# Patient Record
Sex: Female | Born: 1945 | Race: Black or African American | Hispanic: No | Marital: Single | Smoking: Former smoker
Health system: Southern US, Community
[De-identification: ages and names within clinical notes are randomized; demographics above are authoritative.]

## PROBLEM LIST (undated history)

## (undated) DIAGNOSIS — Z5189 Encounter for other specified aftercare: Secondary | ICD-10-CM

## (undated) DIAGNOSIS — E785 Hyperlipidemia, unspecified: Secondary | ICD-10-CM

## (undated) DIAGNOSIS — E119 Type 2 diabetes mellitus without complications: Secondary | ICD-10-CM

## (undated) DIAGNOSIS — H269 Unspecified cataract: Secondary | ICD-10-CM

## (undated) DIAGNOSIS — C801 Malignant (primary) neoplasm, unspecified: Secondary | ICD-10-CM

## (undated) DIAGNOSIS — K219 Gastro-esophageal reflux disease without esophagitis: Secondary | ICD-10-CM

## (undated) DIAGNOSIS — J189 Pneumonia, unspecified organism: Secondary | ICD-10-CM

## (undated) DIAGNOSIS — E079 Disorder of thyroid, unspecified: Secondary | ICD-10-CM

## (undated) DIAGNOSIS — D509 Iron deficiency anemia, unspecified: Secondary | ICD-10-CM

## (undated) DIAGNOSIS — I10 Essential (primary) hypertension: Secondary | ICD-10-CM

## (undated) DIAGNOSIS — R0600 Dyspnea, unspecified: Secondary | ICD-10-CM

## (undated) HISTORY — DX: Type 2 diabetes mellitus without complications: E11.9

## (undated) HISTORY — DX: Unspecified cataract: H26.9

## (undated) HISTORY — PX: US ECHOCARDIOGRAPHY: HXRAD669

## (undated) HISTORY — DX: Gastro-esophageal reflux disease without esophagitis: K21.9

## (undated) HISTORY — PX: TUBAL LIGATION: SHX77

## (undated) HISTORY — DX: Encounter for other specified aftercare: Z51.89

## (undated) HISTORY — PX: CATARACT EXTRACTION: SUR2

## (undated) HISTORY — DX: Iron deficiency anemia, unspecified: D50.9

## (undated) HISTORY — PX: CHOLECYSTECTOMY: SHX55

## (undated) HISTORY — DX: Disorder of thyroid, unspecified: E07.9

## (undated) HISTORY — DX: Hyperlipidemia, unspecified: E78.5

## (undated) HISTORY — DX: Essential (primary) hypertension: I10

---

## 2004-09-18 ENCOUNTER — Ambulatory Visit: Payer: Self-pay | Admitting: Internal Medicine

## 2004-09-30 ENCOUNTER — Ambulatory Visit: Payer: Self-pay | Admitting: Internal Medicine

## 2004-10-13 ENCOUNTER — Ambulatory Visit (HOSPITAL_COMMUNITY): Admission: RE | Admit: 2004-10-13 | Discharge: 2004-10-13 | Payer: Self-pay | Admitting: Internal Medicine

## 2004-10-13 ENCOUNTER — Ambulatory Visit: Payer: Self-pay | Admitting: Internal Medicine

## 2004-10-13 HISTORY — PX: COLONOSCOPY: SHX174

## 2004-10-13 HISTORY — PX: ESOPHAGOGASTRODUODENOSCOPY: SHX1529

## 2005-01-08 ENCOUNTER — Ambulatory Visit: Payer: Self-pay | Admitting: Internal Medicine

## 2005-01-22 ENCOUNTER — Ambulatory Visit: Payer: Self-pay | Admitting: Internal Medicine

## 2005-02-15 ENCOUNTER — Ambulatory Visit: Payer: Self-pay | Admitting: Internal Medicine

## 2005-02-15 ENCOUNTER — Ambulatory Visit (HOSPITAL_COMMUNITY): Admission: RE | Admit: 2005-02-15 | Discharge: 2005-02-15 | Payer: Self-pay | Admitting: Internal Medicine

## 2005-04-06 ENCOUNTER — Ambulatory Visit: Payer: Self-pay | Admitting: Gastroenterology

## 2005-09-27 ENCOUNTER — Ambulatory Visit: Payer: Self-pay | Admitting: Internal Medicine

## 2006-10-17 ENCOUNTER — Ambulatory Visit: Payer: Self-pay | Admitting: Internal Medicine

## 2008-10-11 ENCOUNTER — Ambulatory Visit: Payer: Self-pay | Admitting: Internal Medicine

## 2009-09-05 ENCOUNTER — Encounter (INDEPENDENT_AMBULATORY_CARE_PROVIDER_SITE_OTHER): Payer: Self-pay | Admitting: *Deleted

## 2009-09-25 DIAGNOSIS — K921 Melena: Secondary | ICD-10-CM | POA: Insufficient documentation

## 2009-09-25 DIAGNOSIS — I10 Essential (primary) hypertension: Secondary | ICD-10-CM

## 2009-09-25 DIAGNOSIS — E119 Type 2 diabetes mellitus without complications: Secondary | ICD-10-CM

## 2009-09-25 DIAGNOSIS — E663 Overweight: Secondary | ICD-10-CM | POA: Insufficient documentation

## 2009-09-25 DIAGNOSIS — F17200 Nicotine dependence, unspecified, uncomplicated: Secondary | ICD-10-CM

## 2009-09-25 DIAGNOSIS — Z8719 Personal history of other diseases of the digestive system: Secondary | ICD-10-CM | POA: Insufficient documentation

## 2009-09-25 DIAGNOSIS — E118 Type 2 diabetes mellitus with unspecified complications: Secondary | ICD-10-CM | POA: Insufficient documentation

## 2009-09-25 DIAGNOSIS — D509 Iron deficiency anemia, unspecified: Secondary | ICD-10-CM

## 2009-09-26 ENCOUNTER — Ambulatory Visit: Payer: Self-pay | Admitting: Internal Medicine

## 2009-09-26 DIAGNOSIS — K219 Gastro-esophageal reflux disease without esophagitis: Secondary | ICD-10-CM

## 2009-09-26 DIAGNOSIS — R198 Other specified symptoms and signs involving the digestive system and abdomen: Secondary | ICD-10-CM | POA: Insufficient documentation

## 2009-09-29 ENCOUNTER — Ambulatory Visit: Payer: Self-pay | Admitting: Internal Medicine

## 2009-10-07 ENCOUNTER — Encounter: Payer: Self-pay | Admitting: Gastroenterology

## 2009-10-08 ENCOUNTER — Encounter: Payer: Self-pay | Admitting: Internal Medicine

## 2009-10-09 ENCOUNTER — Encounter: Payer: Self-pay | Admitting: Gastroenterology

## 2009-10-23 ENCOUNTER — Encounter: Payer: Self-pay | Admitting: Internal Medicine

## 2009-10-24 ENCOUNTER — Ambulatory Visit (HOSPITAL_COMMUNITY): Admission: RE | Admit: 2009-10-24 | Discharge: 2009-10-24 | Payer: Self-pay | Admitting: Internal Medicine

## 2009-10-24 ENCOUNTER — Ambulatory Visit: Payer: Self-pay | Admitting: Internal Medicine

## 2009-10-24 HISTORY — PX: ESOPHAGOGASTRODUODENOSCOPY: SHX1529

## 2009-10-24 HISTORY — PX: COLONOSCOPY: SHX174

## 2009-11-03 ENCOUNTER — Encounter (INDEPENDENT_AMBULATORY_CARE_PROVIDER_SITE_OTHER): Payer: Self-pay

## 2009-11-03 ENCOUNTER — Encounter: Payer: Self-pay | Admitting: Internal Medicine

## 2009-11-10 ENCOUNTER — Encounter (INDEPENDENT_AMBULATORY_CARE_PROVIDER_SITE_OTHER): Payer: Self-pay | Admitting: *Deleted

## 2009-11-21 ENCOUNTER — Encounter: Payer: Self-pay | Admitting: Internal Medicine

## 2009-11-24 LAB — CONVERTED CEMR LAB
Basophils Absolute: 0 10*3/uL (ref 0.0–0.1)
Basophils Relative: 1 % (ref 0–1)
Hemoglobin: 12.1 g/dL (ref 12.0–15.0)
MCHC: 31.7 g/dL (ref 30.0–36.0)
Monocytes Absolute: 0.1 10*3/uL (ref 0.1–1.0)
Neutro Abs: 2.2 10*3/uL (ref 1.7–7.7)
Neutrophils Relative %: 57 % (ref 43–77)
Platelets: 236 10*3/uL (ref 150–400)
RDW: 15 % (ref 11.5–15.5)

## 2009-11-25 ENCOUNTER — Encounter: Payer: Self-pay | Admitting: Urgent Care

## 2009-12-09 ENCOUNTER — Ambulatory Visit: Payer: Self-pay | Admitting: Internal Medicine

## 2009-12-09 ENCOUNTER — Encounter: Payer: Self-pay | Admitting: Gastroenterology

## 2010-05-04 ENCOUNTER — Encounter (INDEPENDENT_AMBULATORY_CARE_PROVIDER_SITE_OTHER): Payer: Self-pay

## 2010-06-08 LAB — CONVERTED CEMR LAB
Basophils Relative: 1 % (ref 0–1)
Eosinophils Absolute: 0.1 10*3/uL (ref 0.0–0.7)
Lymphs Abs: 1 10*3/uL (ref 0.7–4.0)
MCV: 80.7 fL (ref 78.0–100.0)
Neutro Abs: 2.3 10*3/uL (ref 1.7–7.7)
Neutrophils Relative %: 59 % (ref 43–77)
Platelets: 281 10*3/uL (ref 150–400)
WBC: 3.8 10*3/uL — ABNORMAL LOW (ref 4.0–10.5)

## 2010-06-10 ENCOUNTER — Encounter (INDEPENDENT_AMBULATORY_CARE_PROVIDER_SITE_OTHER): Payer: Self-pay | Admitting: *Deleted

## 2010-06-25 ENCOUNTER — Ambulatory Visit: Payer: Self-pay | Admitting: Internal Medicine

## 2010-07-06 ENCOUNTER — Ambulatory Visit: Payer: Self-pay | Admitting: Internal Medicine

## 2010-07-08 ENCOUNTER — Encounter: Payer: Self-pay | Admitting: Internal Medicine

## 2010-07-17 ENCOUNTER — Emergency Department (HOSPITAL_COMMUNITY)
Admission: EM | Admit: 2010-07-17 | Discharge: 2010-07-17 | Payer: Self-pay | Source: Home / Self Care | Admitting: Emergency Medicine

## 2010-07-27 ENCOUNTER — Ambulatory Visit (HOSPITAL_COMMUNITY)
Admission: RE | Admit: 2010-07-27 | Discharge: 2010-07-27 | Payer: Self-pay | Source: Home / Self Care | Attending: Internal Medicine | Admitting: Internal Medicine

## 2010-07-27 ENCOUNTER — Encounter (INDEPENDENT_AMBULATORY_CARE_PROVIDER_SITE_OTHER): Payer: Self-pay

## 2010-07-27 HISTORY — PX: GIVENS CAPSULE STUDY: SHX5432

## 2010-08-06 ENCOUNTER — Telehealth: Payer: Self-pay | Admitting: Gastroenterology

## 2010-08-12 DIAGNOSIS — K31819 Angiodysplasia of stomach and duodenum without bleeding: Secondary | ICD-10-CM

## 2010-08-21 ENCOUNTER — Encounter: Payer: Self-pay | Admitting: Gastroenterology

## 2010-09-03 ENCOUNTER — Encounter: Payer: Self-pay | Admitting: Internal Medicine

## 2010-09-03 LAB — CONVERTED CEMR LAB
Lymphs Abs: 1.1 10*3/uL (ref 0.7–4.0)
Monocytes Relative: 8 % (ref 3–12)
Neutro Abs: 2.2 10*3/uL (ref 1.7–7.7)
Neutrophils Relative %: 60 % (ref 43–77)
RBC: 4.23 M/uL (ref 3.87–5.11)
WBC: 3.6 10*3/uL — ABNORMAL LOW (ref 4.0–10.5)

## 2010-09-15 NOTE — Miscellaneous (Signed)
Summary: Orders Update  Clinical Lists Changes  Orders: Added new Test order of T-CBC w/Diff ([id number]) - Signed 

## 2010-09-15 NOTE — Letter (Signed)
Summary: CAPSUL STUDY ORDER  CAPSUL STUDY ORDER   Imported By: Ave Filter 07/08/2010 16:01:18  _____________________________________________________________________  External Attachment:    Type:   Image     Comment:   External Document

## 2010-09-15 NOTE — Letter (Signed)
Summary: Recall, Labs Needed  [facility]  [address]   [place], [place] [address]   Phone: [phone]  Fax: [phone]    May 04, 2010  Jenna [name] [address] [place], [place]  [address] [birth year]   Dear [name],   Our records indicate it is time to repeat your blood work.  You can take the enclosed form to the lab on or near the date indicated.  Please make note of the new location of the lab:   [address], 2nd floor   [facility]  Our office will call you within a week to ten business days with the results.  If you do not hear from [place] in 10 business days, you should call the office.  If you have any questions regarding this, call the office at [phone], and ask for the nurse.  Labs are due on 06/10/2010.   Sincerely,    Hendricks Limes LPN  [facility] Ph: [phone]   Fax: [phone]

## 2010-09-15 NOTE — Letter (Signed)
Summary: Appointment Reminder  [facility]  [address]   [place], [place] [address]   Phone: [phone]  Fax: [phone]       September 05, 2009   Jenna [name] [address] [place], [place]  [address] 04/09/1946    Dear [name],  We have been unable to reach you by phone to schedule a follow up   appointment that was recommended for you by Dr. Jena Gauss. It is very   important that we reach you to schedule an appointment. We hope that you  allow [place] to participate in your health care needs. Please contact us at  [phone] at your earliest convenience to schedule your appointment.  Sincerely,    [facility] R. Roetta Sessions, M.D.    Kassie Mends, M.D. Lorenza Burton, FNP-BC    Tana Coast, PA-C Phone: [phone]    Fax: [phone]

## 2010-09-15 NOTE — Assessment & Plan Note (Signed)
Summary: crd/glu   One iFob returned and it was positive.  Allergies: 1)  ! * Nexium  Other Orders: Immuno-chemical Fecal Occult (38756)  Appended Document: crd/glu See ov dict addendum.

## 2010-09-15 NOTE — Medication Information (Signed)
Summary: PA for dexilant  PA for dexilant   Imported By: Hendricks Limes LPN 14/78/2956 21:30:86  _____________________________________________________________________  External Attachment:    Type:   Image     Comment:   External Document

## 2010-09-15 NOTE — Letter (Signed)
Summary: Patient Notice, Endo Biopsy Results  [facility]  [address]   [place], [place] [address]   Phone: [phone]  Fax: [phone]       November 03, 2009   Jenna [name] [address] [place], [place]  [address] Jun 28, 1946    Dear [name],  I am pleased to inform you that the biopsies taken during your recent endoscopic examination did not show any evidence of cancer upon pathologic examination.  There was mild inflammation in your stomach.  Additional information/recommendations:   Continue with the treatment plan as outlined on the day of your exam.  You should have a repeat colonoscopy examination in 3 years.  Please call us if you are having persistent problems or have questions about your condition that have not been fully answered at this time.  Sincerely,    R. Roetta Sessions MD  [facility] Ph: [phone]   Fax: [phone]   Appended Document: Patient Notice, Endo Biopsy Results Letter mailed. LM for pt to call. (Needs appt with extender with CBC in 4-6 weeks per Dr. Jena Gauss)  Lab order on file.  Appended Document: Patient Notice, Endo Biopsy Results lm for pt to call back.

## 2010-09-15 NOTE — Assessment & Plan Note (Signed)
Summary: OV TO FU ON ANEMIA,MAY NEED CAPSULE/SS   Visit Type:  Follow-up Visit Primary Care Jenna [name]:  Sasser  Chief Complaint:  F/U anemia.  History of Present Illness: 65 year old lady with history of anemia and Hemoccult-positive stools. Prior EGD and colonoscopy earlier this year demonstrated duodenal AVM ablated y; biopsies negative. Anemia transit resolved ;with most recent CBC from October 21 revealed a hemoglobin of 11 MCV 80. Clinically doing well no abdominal pain melena, hematochezia; reflux symptoms continue be well controlled with Dexilant. She is not on any iron supplementation or vitamins at this time. This lady had a similar workup for similar problem back into the 2008. 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  Current Medications (verified): 1)  Glipizide 5 Mg Tabs (Glipizide) .... Two Times A Day 2)  Dexilant 60 Mg Cpdr (Dexlansoprazole) .... One By Mouth 30 Mins Before Breakfast Daily 3)  Losartan Potassium 100 Mg Tabs (Losartan Potassium) .... Take 1 Tablet By Mouth Once A Day  Allergies (verified): 1)  ! * Nexium 2)  ! 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 Jenna [name]  Past Medical History: Hypertension Gerd Iron Deficiency Anemia  Family History: Father: Deceased age 71  old age Mother: Decease MI age 77 Siblings: 3 one brother deceased age 66   DM one sister deceased age 56 DM/CVA One sister living   DM  Social History: Marital Status: No Children:3 living    one deceased age 78   kidney failure  Occupation: Loss adjuster, [employer]  AM  [employer]  Vital Signs:  Patient profile:   65 year old female Height:      65.5 inches Weight:      194 pounds BMI:     31.91 Temp:     99.1 degrees F oral Pulse rate:   72 / minute BP sitting:   140 / 80  (left arm) Cuff size:   regular  Vitals Entered By: Cloria Spring LPN (2010-07-12 8:49 AM)  Physical Exam  General:  alert conversant pleasant no acute distress Abdomen:  somewhat obese positive bowel sounds soft and nontender without appreciable mass or organomegaly  Impression & Recommendations: Impression:   pleasant 65 year old lady with a history of a mild anemia,  occult blood positive stool. History of a duodenal AVM ablated previously. Clinically doing very well with a mild microcytic anemia. Not mentioned above, she denies taking nonsteroidal agents.  Recommendations: Continue Dexalant 60 mg orally daily. Recommend a multivitamin with iron i.e. Centrum Silver daily  Send her home with occult blood test kit for stool.  Assuming DI FOBT is negative, we'll repeat her CBC in January 2012. Office visit here in 6 months.  Other Orders: Est. Patient Level III ([address])

## 2010-09-15 NOTE — Letter (Signed)
Summary: TCS/EGD ORDER  TCS/EGD ORDER   Imported By: Ave Filter 10/08/2009 10:05:14  _____________________________________________________________________  External Attachment:    Type:   Image     Comment:   External Document

## 2010-09-15 NOTE — Assessment & Plan Note (Signed)
Summary: Needs appt with extender with CBC in 4-6 weeks per Dr. Arelia Sneddon   Visit Type:  Follow-up Visit Primary Care Provider:  Sasser  Chief Complaint:  F/U procedure.  History of Present Illness: [name] is here for f/u visit. She has chronic GERD. She did not tolerate Nexium due to headache and n/v. Previously did well on Aciphex. At last OV she c/o frequent epigastric pain and n/v which occurs several hours after meals. She was started on Dexilant with complete control of these symptoms. She had TCS/TI/EGD for abd pain and positive ifobt, h/o IDA.  She had abnormal lesion in the  ascending colon just distal to the ileocecal valve with benign bx. The remainder of the colonic mucosa and terminal ileum mucosa appeared grossly normal.  However, the prep made the exam more difficult and a smaller lesion may have been obscured. She had normal esophagus, small hiatal hernia, nodular lesion antrum/distal greater curvature, status post biopsy (benign).  Duodenal AVM ablated.  Recent H/H was normal. WBC slightly low at 3.8.           Current Medications (verified): 1)  Multivitamins  Tabs (Multiple Vitamin) .... Once Daily 2)  Glipizide 5 Mg Tabs (Glipizide) .... Two Times A Day 3)  Dexilant 60 Mg Cpdr (Dexlansoprazole) .... One By Mouth 30 Mins Before Breakfast Daily 4)  Losartan Potassium 100 Mg Tabs (Losartan Potassium) .... Take 1 Tablet By Mouth Once A Day  Allergies (verified): 1)  ! * Nexium 2)  ! Asa  Review of Systems      See HPI  Vital Signs:  Patient profile:   65 year old female Height:      65.5 inches Weight:      189 pounds BMI:     31.08 Temp:     98.7 degrees F oral Pulse rate:   60 / minute BP sitting:   128 / 60  (left arm) Cuff size:   large  Vitals Entered By: Cloria Spring LPN (December 09, 2009 9:36 AM)  Physical Exam  General:  Well developed, well nourished, no acute distress. Head:  Normocephalic and atraumatic. Eyes:  Sclera nonicteric. Mouth:   OP moist. Extremities:  No clubbing, cyanosis, edema or deformities noted. Neurologic:  Alert and  oriented x4;  grossly normal neurologically. Skin:  Intact without significant lesions or rashes. Psych:  Alert and cooperative. Normal mood and affect.  Impression & Recommendations:  Problem # 1:  ANEMIA, IRON DEFICIENCY (ICD-280.9)  H/O IDA in past. Current H/H normal. Will keep check on H/H, given h/o AVM. Recheck H/H in six months.  Orders: Est. Patient Level II ([address])  Problem # 2:  GERD (ICD-530.81)  Doing well on Dexilant. OV in 4/12.  Orders: Est. Patient Level II ([address])  Appended Document: Needs appt with extender with CBC in 4-6 weeks per Dr. Arelia Sneddon Please arrange for CBC in 6 months for h/o IDA, heme positive stool.  Appended Document: Needs appt with extender with CBC in 4-6 weeks per Dr. Arelia Sneddon lab order on file

## 2010-09-15 NOTE — Op Note (Signed)
  Jenna [name], Jenna [name]           ACCOUNT NO.:  [id number]  MEDICAL RECORD NO.:  [id number]          PATIENT TYPE:  AMB  LOCATION:  DAY                           FACILITY:  [facility]  PHYSICIAN:  R. Roetta Sessions, M.D. DATE OF BIRTH:  07/30/1946  DATE OF PROCEDURE:  07/27/2010 DATE OF DISCHARGE:  07/27/2010                              OPERATIVE REPORT   PROCEDURE:  Small bowel Givens caps study.  INDICATIONS FOR PROCEDURE:  Ms. Stauch is a pleasant, 65 year old female who has a history of iron deficiency anemia as well as heme- positive stools.  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  She was last seen in our clinic in November, doing clinically well.  However, her hemoglobin was slightly low at 11 and 35.6.  IFOBT was positive.  It was then decided, due to her history of duodenal AVMs as well as the most recent endoscopy in March that had an AVM ablated, that it was necessary to do a Givens capsule study.  As of note, in March she underwent an endoscopy and a colonoscopy secondary to epigastric pain and heme-positive stools. Colonoscopy showed normal a rectum, an abnormal lesion in the ascending colon distal to the ileocecal valve status post biopsy which was normal. EG findings showed a normal esophagus, small hiatal hernia, nodular lesion in the antrum/distal greater curvature status post biopsy as well as a duodenal AVM that was ablated.  FINDINGS:  First gastric image was at 44 seconds, first duodenal image was at 2 hours, 30 minutes, and 44 seconds.  First ileocecal valve image was at 5 hours, 28 minutes, and 25 seconds.  First cecal image was at 5 hours, 28 minutes, and 46 seconds.  As of note, there are multiple AVMs noted starting at 2 hours and 30 minutes and 53 seconds.  None were actively bleeding.  No other masses or strictures were noted.  ASSESSMENT AND RECOMMENDATIONS:  Ms. Prada is a 65 year old  female who has undergone a small bowel Givens capsule study.  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  There is no evidence of mass stricture on this study; however, there are multiple arteriovenous malformations which could definitely be the contributor to her drifting hemoglobin and hematocrit. She is clinically stable at this time, and she states that she is avoiding nonsteroidals.  She is also off aspirin as well.  She will continue Dexilant daily, which she has been taking for her reflux as well as a multivitamin with iron.  She needs to avoid all nonsteroidal antiinflammatory drugs and aspirin products.  We will repeat a CBC in 3 months to assess for stability.    ______________________________ Gerrit Halls, ANP-BC   ______________________________ R. Roetta Sessions, M.D.    AS/MEDQ  D:  08/12/2010  T:  08/12/2010  Job:  [id number]  Electronically Signed by Gerrit Halls  on 09/02/2010 04:15:51 PM Electronically Signed by Lorrin Goodell M.D. on 09/15/2010 01:35:49 PM

## 2010-09-15 NOTE — Letter (Signed)
Summary: Scheduled Appointment  [facility]  [address]   [place], [place] [address]   Phone: [phone]  Fax: [phone]    June 10, 2010   Dear: Jenna [name]            DOB: April 10, 1946    I have been instructed to schedule you an appointment in our office.  Your appointment is as follows:   Date:           June 25, 2010   Time:           8:45AM     Please be here 15 minutes early.   Melissaann Dizdarevic:      DR Jena Gauss    Please contact the office if you need to reschedule this appointment for a more convenient time.   Thank you,    Diana Eves       [facility] Ph: [phone]   Fax: [phone]

## 2010-09-15 NOTE — Assessment & Plan Note (Signed)
Summary: yearly fu/GERD/SS   Visit Type:  f/u Primary Care Provider:  Sasser  Chief Complaint:  1 year follow up- needs something for reflux.  History of Present Illness: Ms. Klunder is here for one year f/u visit. She has chronic GERD. She did not tolerate Nexium due to headache and n/v. Previously did well on Aciphex. She c/o frequent epigastric pain and n/v which occurs several hours after meals. Denies dysphagia or wt loss. BM 2-3 per day, some loose and some hard. Feels urge to go often but urge gone before she can get to bathroom. No melena or brbpr.  No wt gain.        Current Medications (verified): 1)  Multivitamins  Tabs (Multiple Vitamin) .... Once Daily 2)  Glipizide 5 Mg Tabs (Glipizide) .... Two Times A Day 3)  Benicar 20 Mg Tabs (Olmesartan Medoxomil) .... Once Daily 4)  Aspir-Low 81 Mg Tbec (Aspirin) .... Once Daily  Allergies (verified): 1)  ! * Nexium  Review of Systems      See HPI  Vital Signs:  Patient profile:   65 year old female Height:      65.5 inches Weight:      194 pounds BMI:     31.91 Temp:     97.9 degrees F oral Pulse rate:   64 / minute BP sitting:   142 / 80  (left arm) Cuff size:   regular  Vitals Entered By: Hendricks Limes LPN (September 26, 2009 11:12 AM)  Physical Exam  General:  Well developed, well nourished, no acute distress. Head:  Normocephalic and atraumatic. Eyes:  Sclera nonicteric. Mouth:  OP miost.  Abdomen:  Mild epigastric tenderness. No HSM or masses. No rebound or guarding. No abd bruit or hernia. Extremities:  No clubbing, cyanosis, edema or deformities noted. Neurologic:  Alert and  oriented x4;  grossly normal neurologically. Skin:  Intact without significant lesions or rashes. Psych:  Alert and cooperative. Normal mood and affect.  Impression & Recommendations:  Problem # 1:  GERD (ICD-530.81)  Suspect pp epigastric pain, n/v secondary to untreated GERD. Start Dexilant 60mg  by mouth daily. Covered by [facility].  #20 samples, RX, rebate card provided. If controls symptoms, then OV in one year. If persisted symptoms after two weeks, then will get her back on Aciphex and fill out prior authorization (since she did well previously).   Orders: Est. Patient Level II ([address])  Problem # 2:  ANEMIA, IRON DEFICIENCY (ICD-280.9)  H/O IDA in past. No signigicant change in bowels but complains of feeling like needs to have bowel movment frequently. Will check ifobt. Retrieve lab results, to be done 3/11 at Dr. Dian Situ office. Further recommendations to follow. Last TCS five years ago.   Orders: Est. Patient Level II ([address]) Prescriptions: DEXILANT 60 MG CPDR (DEXLANSOPRAZOLE) one by mouth 30 mins before breakfast daily  #30 x 11   Entered and Authorized by:   Leanna Battles. Dixon Boos   Signed by:   Leanna Battles Taichi Repka PA-C on 09/26/2009   Method used:   Electronically to        [facility] (retail)       [address]       [place], [place]  [address]       Ph: [phone]       Fax: [phone]   RxID:   [id number]   Appended Document: yearly fu/GERD/SS ifobt positive. Recommend TCS +/-  EGD with RMR. Day of prep take 1/2 dose of glipizide. She needs CBC now. Diagnosis: positive ifobt, h/o ida, epig pain with vomiting ?untreated GERD.  Appended Document: yearly fu/GERD/SS LMOM to call. Lab order on file to be faxed.  Appended Document: yearly fu/GERD/SS Pt informed. Lab order faxed to [facility] per pt request.  Appended Document: yearly fu/GERD/SS Pt scheduled for procedure on 10/24/09@7 :30am. Pt aware of appt.

## 2010-09-15 NOTE — Letter (Signed)
Summary: Scheduled Appointment  [facility]  [address]   [place], [place] [address]   Phone: [phone]  Fax: [phone]    November 10, 2009   Dear: Hildred Laser            DOB: 1945-12-20    I have been instructed to schedule you an appointment in our office.  Your appointment is as follows:   Date: April 26,2011   Time: 930am     Please be here 15 minutes early.   Provider: Tana Coast    Please contact the office if you need to reschedule this appointment for a more convenient time.   Thank you,    [facility] Ph: [phone]   Fax: [phone]

## 2010-09-15 NOTE — Assessment & Plan Note (Signed)
Summary: DROPPED OFF STOOL/SS   Pt returned one iFOBT and it was positive.    Allergies: 1)  ! * Nexium 2)  ! Asa  Other Orders: Immuno-chemical Fecal Occult ([id number])  Appended Document: DROPPED OFF STOOL/SS history of duodenal AVMs on capsule 3 years ago. I think we should go ahead and do another capsule and confirm prior findings and reassess small bowel now. She may need to be referred for double balloon enteroscopy at a tertiary referral center.  Appended Document: DROPPED OFF STOOL/SS tried to call pt- not home- left message for return call  Appended Document: DROPPED OFF STOOL/SS Pt scheduled for 07/27/10@7 :30am...pt aware of appt.  Appended Document: DROPPED OFF STOOL/SS Pt scheduled for Givens Capsule 07/27/10@7 :30.Marland KitchenMarland KitchenPt aware of appt.

## 2010-09-15 NOTE — Letter (Signed)
Summary: Recall, Labs Needed  [facility]  [address]   [place], [place] [address]   Phone: [phone]  Fax: [phone]    November 03, 2009  KHRISTINA JANOTA [address] [place], [place]  [address] 02/09/1946   Dear [name],   Our records indicate it is time to repeat your blood work.  You can take the enclosed form to the lab on or near the date indicated.  Please make note of the new location of the lab:   [address], 2nd floor   [facility]  Our office will call you within a week to ten business days with the results.  If you do not hear from [place] in 10 business days, you should call the office.  If you have any questions regarding this, call the office at [phone], and ask for the nurse.  Labs are due on 12/04/2009.   Sincerely,    Hendricks Limes LPN  [facility] Ph: [phone]   Fax: [phone]

## 2010-09-17 ENCOUNTER — Encounter (INDEPENDENT_AMBULATORY_CARE_PROVIDER_SITE_OTHER): Payer: Self-pay | Admitting: *Deleted

## 2010-09-17 NOTE — Letter (Signed)
Summary: Recall, Labs Needed  [facility]  [address]   [place], [place] [address]   Phone: [phone]  Fax: [phone]    July 27, 2010  SEAIRRA OTANI [address] [place], [place]  [address] 1946-03-22   Dear [name],   Our records indicate it is time to repeat your blood work.  You can take the enclosed form to the lab on or near the date indicated.  Please make note of the new location of the lab:   [address], 2nd floor   [facility]  Our office will call you within a week to ten business days with the results.  If you do not hear from [place] in 10 business days, you should call the office.  If you have any questions regarding this, call the office at [phone], and ask for the nurse.  Labs are due on 08/24/2010.   Sincerely,    Hendricks Limes LPN  [facility] Ph: [phone]   Fax: [phone]

## 2010-09-17 NOTE — Miscellaneous (Signed)
Summary: Orders Update  Clinical Lists Changes  Orders: Added new Test order of T-CBC w/Diff ([id number]) - Signed 

## 2010-09-17 NOTE — Progress Notes (Signed)
   Informed pt capsule study recently downloaded yesterday. Reviewed today by myself, will review with another extender on Tuesday. Pt informed and was agreeable to this.  Appended Document:  please call pt and inform study has been read. Multiple AVMs noted. No active bleeding. Needs to avoid NSAIDs, ASA, things of that nature. Will need CBC rechecked in 3 mos. Contact office if any evidence of active bleeding.   Appended Document:  tried to call pt- NA  Appended Document:  tried to call pt- LMOM  Appended Document:  pt aware, lab order on file

## 2010-09-23 NOTE — Letter (Signed)
Summary: Recall Office Visit  [facility]  [address]   [place], [place] [address]   Phone: [phone]  Fax: [phone]      September 17, 2010   DONNICE NIELSEN [address] [place], [place]  [address] [birth year]   Dear [name],   According to our records, it is time for you to schedule a follow-up office visit with [place].   At your convenience, please call [phone] to schedule an office visit. If you have any questions, concerns, or feel that this letter is in error, we would appreciate your call.   Sincerely,    Diana Eves  [facility] Ph: [phone]   Fax: [phone]

## 2010-10-08 ENCOUNTER — Telehealth (INDEPENDENT_AMBULATORY_CARE_PROVIDER_SITE_OTHER): Payer: Self-pay | Admitting: *Deleted

## 2010-10-09 ENCOUNTER — Encounter: Payer: Self-pay | Admitting: Internal Medicine

## 2010-10-09 LAB — CONVERTED CEMR LAB
Basophils Relative: 0 % (ref 0–1)
Eosinophils Absolute: 0.1 10*3/uL (ref 0.0–0.7)
HCT: 35.1 % — ABNORMAL LOW (ref 36.0–46.0)
Hemoglobin: 10.3 g/dL — ABNORMAL LOW (ref 12.0–15.0)
MCHC: 29.3 g/dL — ABNORMAL LOW (ref 30.0–36.0)
MCV: 80.7 fL (ref 78.0–100.0)
Monocytes Absolute: 0.4 10*3/uL (ref 0.1–1.0)
Monocytes Relative: 8 % (ref 3–12)
RBC: 4.35 M/uL (ref 3.87–5.11)

## 2010-10-13 NOTE — Miscellaneous (Signed)
Summary: Orders Update  Clinical Lists Changes  Orders: Added new Test order of T-CBC w/Diff ([id number]) - Signed 

## 2010-10-13 NOTE — Progress Notes (Signed)
  Phone Note Call from Patient   Reason for Call: Refill Medication, Talk to Nurse Summary of Call: please call pt back to let her know if her dexilant rx has been called in to [facility] [phone] Initial call taken by: Diana Eves,  October 08, 2010 3:31 PM     Appended Document:  pt aware rx has been sent

## 2010-10-23 ENCOUNTER — Encounter (INDEPENDENT_AMBULATORY_CARE_PROVIDER_SITE_OTHER): Payer: Self-pay

## 2010-10-27 LAB — URINALYSIS, ROUTINE W REFLEX MICROSCOPIC
Bilirubin Urine: NEGATIVE
Hgb urine dipstick: NEGATIVE
Protein, ur: NEGATIVE mg/dL
Specific Gravity, Urine: >1.03 — ABNORMAL HIGH (ref 1.005–1.030)
Urobilinogen, UA: 0.2 mg/dL (ref 0.0–1.0)

## 2010-10-27 NOTE — Letter (Signed)
Summary: Recall, Labs Needed  [facility]  [address]   [place], [place] [address]   Phone: [phone]  Fax: [phone]    October 23, 2010  Jenna [name] [address] [place], [place]  [address] March 13, 1946   Dear [name],   Our records indicate it is time to repeat your blood work.  You can take the enclosed form to the lab on or near the date indicated.  Please make note of the new location of the lab:   [address], 2nd [address]   [facility]  Our office will call you within a week to ten business days with the results.  If you do not hear from [place] in 10 business days, you should call the office.  If you have any questions regarding this, call the office at [phone], and ask for the nurse.  Labs are due on 11/23/10.   Sincerely,    Hendricks Limes LPN  [facility] Ph: [phone]   Fax: [phone]

## 2010-10-30 ENCOUNTER — Encounter: Payer: Self-pay | Admitting: Gastroenterology

## 2010-11-02 ENCOUNTER — Ambulatory Visit: Payer: Self-pay | Admitting: Gastroenterology

## 2010-11-09 ENCOUNTER — Ambulatory Visit: Payer: Self-pay | Admitting: Gastroenterology

## 2010-11-09 LAB — GLUCOSE, CAPILLARY

## 2010-11-20 ENCOUNTER — Other Ambulatory Visit: Payer: Self-pay | Admitting: Internal Medicine

## 2010-11-20 LAB — CBC WITH DIFFERENTIAL/PLATELET
Basophils Relative: 0 % (ref 0–1)
HCT: 33.5 % — ABNORMAL LOW (ref 36.0–46.0)
Hemoglobin: 10.2 g/dL — ABNORMAL LOW (ref 12.0–15.0)
Lymphs Abs: 1 10*3/uL (ref 0.7–4.0)
MCHC: 30.4 g/dL (ref 30.0–36.0)
Monocytes Absolute: 0.3 10*3/uL (ref 0.1–1.0)
Monocytes Relative: 10 % (ref 3–12)
Neutro Abs: 1.9 10*3/uL (ref 1.7–7.7)

## 2010-12-11 ENCOUNTER — Encounter: Payer: Self-pay | Admitting: Internal Medicine

## 2010-12-11 NOTE — Progress Notes (Signed)
Lets get a cbc and another ifobt on this lady just prior to her next o/v

## 2010-12-14 NOTE — Progress Notes (Signed)
Office visit in June with RMR

## 2010-12-15 NOTE — Progress Notes (Signed)
Pt has already done cbc, has ov in June 2012. She stated she did ifobt last year, asking if she has to repeat it in separate documentation.

## 2010-12-29 NOTE — Assessment & Plan Note (Signed)
NAMEMarland Kitchen  ANASOFIA, MICALLEF            CHART#:  [id number]   DATE:  10/11/2008                       DOB:  August 25, 1945   FOLLOWUP:  A 2 year follow-up of gastroesophageal reflux disease.  Ms.  Dargis has had very good control of her reflux symptoms with Aciphex  20 grams orally daily.  Her  third party payer dictated a change to  Nexium since her last visit.  Taking Nexium was associated with headache  and nausea and vomiting, and she did not take it very long and stopped.  Has not really been on any acid suppression therapy.  However, she has  taken some Zantac periodically and takes Tums.  She has gained 12 pounds  since she was last here.  No Odynophagia, no dysphagia.  Last  colonoscopy was back in 2006 without significant findings.  She is due  for routine surveillance 2016.   MEDICATIONS:  See updated list.   ALLERGIES:  NEXIUM   PHYSICAL EXAMINATION:  GENERAL:  Today, a 62-year lady, conversant, no  acute distress.  VITAL SIGNS:  Weight 194, height 5 feet, 5-1/2 inch, temperature 98, BP  120/70, pulse 60.  SKIN:  Warm and dry.  CHEST:  Lungs are clear to auscultation.  HEART:  Regular rate and rhythm without murmur, gallop or rub.  ABDOMEN:  Nondistended, positive bowel sounds, soft, nontender without  appreciable mass or megaly.   ASSESSMENT:  Ms. Besecker is a very pleasant 65 year old lady with  longstanding gastroesophageal reflux disease symptoms.  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   She is intolerant to Nexium and likely do poor with Zegerid or any other  form of omeprazole.   RECOMMENDATIONS:  Antireflux lifestyle/diet would be nice if she could  lose down to 175-180 range.  This would be likely associated with  significant improvement in reflux disease. Would like to make contact  with her insurance company and see what other PPIs with which they would  provide a benefit.  If it gets back to Nexium  only, will go to bat for  her and try to get  good coverage.  Unless something comes up, plan to see this very nice  lady back in one year and later for screening colonoscopy 2016.       Jonathon Bellows, M.D.  Electronically Signed     RMR/MEDQ  D:  10/11/2008  T:  10/11/2008  Job:  [id number]   cc:   Fara Chute

## 2011-01-01 ENCOUNTER — Ambulatory Visit: Payer: Self-pay | Admitting: Internal Medicine

## 2011-01-01 NOTE — Op Note (Signed)
NAMESEBRENA, Jenna [name]           ACCOUNT NO.:  [id number]   MEDICAL RECORD NO.:  [id number]          PATIENT TYPE:  AMB   LOCATION:  DAY                           FACILITY:  [facility]   PHYSICIAN:  R. Roetta Sessions, M.D. DATE OF BIRTH:  01-19-1946   DATE OF PROCEDURE:  10/13/2004  DATE OF DISCHARGE:                                 OPERATIVE REPORT   PROCEDURE PERFORMED:  Esophagogastroduodenoscopy with biopsy followed by  colonoscopy and biopsy.   INDICATIONS FOR PROCEDURE:  The patient is a 65 year old lady with  epigastric, retrosternal chest symptoms consistent with GERD.  Symtoms have  been much improved on Aciphex 20 mg orally daily.  She is here for further  evaluation of her upper GI tract via EGD.  Also she is here for screening  colonoscopy.  This approach has been discussed with the patient at length,  potential risks, benefits and alternatives have been reviewed and questions  answered.  The patient is agreeable.  Please see documentation in the  medical record for more information.   PROCEDURE NOTE:  Oxygen saturations, blood pressure, pulse and respirations were monitored  throughout the entirety of the procedure.   CONSCIOUS SEDATION:  Versed 4 mg IV, Demerol 100 mg IV in divided doses.   INSTRUMENT USED:  Olympus video chip system.   FINDINGS:  EGD:  Examination of the tubular esophagus revealed no mucosal  abnormalities. Esophagogastric junction easily traversed.   Stomach:  The gastric cavity was emptied and insufflated well with air.  Thorough examination of the gastric mucosa including retroflex view of the  proximal stomach, esophagogastric junction was undertaken.  The patient had  a volcano like 5 mm nodule with central crater in the antrum.  Please see  photos.  Otherwise, gastric mucosa appeared normal.  Pylorus was patent and  easily traversed.  Examination of the bulb, second portion revealed no  abnormalities.   THERAPY/DIAGNOSTIC MANEUVERS PERFORMED:   The volcano like lesion was  biopsied for histologic study.  The patient tolerated the procedure well and  was prepared for colonoscopy.   Colonoscopy:  A digital rectal exam revealed no abnormalities.   ENDOSCOPIC FINDINGS:  Prep was good.   Rectum:  Examination of the rectal mucosa including retroflex of the anal  verge revealed no abnormalities.   Colon:  Colonic mucosa was surveyed from the rectosigmoid junction to the  left transverse and right colon to the area of the appendiceal orifice,  ileocecal valve and cecum.  These structures were well seen and photographed  for the record.  From this level, the scope was slowly withdrawn.  All  previously mentioned mucosal surfaces were again.  There was a 3 mm polyp at  the splenic flexure which was cold biopsied/removed.  Otherwise, the colonic  mucosa appeared normal.  The patient tolerated both procedures well, was  reacted in endoscopy.   IMPRESSION:  1.  Normal esophagus.  2.  Nodular volcano like lesion in the antrum, either representing a      pancreatic rest or leiomyoma, biopsied.  Remainder of the gastric mucosa      appeared normal, normal  D1-D2.   COLONOSCOPY FINDINGS:  1.  Normal rectum.  2.  Diminutive polyps, splenic flexure, cold biopsied/removed.  Remainder of      colonic mucosa appeared normal.   RECOMMENDATIONS:  1.  Continue Aciphex 20 mg orally daily.  2.  Follow-up on pathology.  3.  Follow-up appointment with [place] in two months.  4.  Further recommendations to follow.      RMR/MEDQ  D:  10/13/2004  T:  10/13/2004  Job:  [id number]   cc:   Fara Chute  [address] [place]  [place] [address]  Fax: [phone]

## 2011-01-29 ENCOUNTER — Ambulatory Visit (INDEPENDENT_AMBULATORY_CARE_PROVIDER_SITE_OTHER): Payer: 59 | Admitting: Internal Medicine

## 2011-01-29 ENCOUNTER — Encounter: Payer: Self-pay | Admitting: Internal Medicine

## 2011-01-29 ENCOUNTER — Ambulatory Visit: Payer: Self-pay | Admitting: Internal Medicine

## 2011-01-29 VITALS — BP 145/68 | HR 76 | Temp 97.0°F | Ht 65.0 in | Wt 184.0 lb

## 2011-01-29 DIAGNOSIS — D649 Anemia, unspecified: Secondary | ICD-10-CM

## 2011-01-29 DIAGNOSIS — K219 Gastro-esophageal reflux disease without esophagitis: Secondary | ICD-10-CM

## 2011-01-29 DIAGNOSIS — D509 Iron deficiency anemia, unspecified: Secondary | ICD-10-CM

## 2011-01-29 NOTE — Progress Notes (Signed)
Primary Care Physician:  Estanislado Pandy, MD  Primary Gastroenterologist:  Dr. Jena Gauss  No chief complaint on file.   HPI:  Jenna [name] is a 65 y.o. female here   Past Medical History  Diagnosis Date  . Hypertension   . GERD (gastroesophageal reflux disease)   . Iron deficiency anemia     Past Surgical History  Procedure Date  . Tubal ligation   . Colonoscopy 10/2009    Current Outpatient Prescriptions  Medication Sig Dispense Refill  . dexlansoprazole (DEXILANT) 60 MG capsule Take 60 mg by mouth daily. 1 BY MOUTH 30 MIN BEFORE BREAKFAST DAILY       . glipiZIDE (GLUCOTROL) 5 MG tablet Take 5 mg by mouth 2 (two) times daily before a meal.        . losartan (COZAAR) 100 MG tablet Take 100 mg by mouth daily.          Allergies as of 01/29/2011 - Review Complete 01/29/2011  Allergen Reaction Noted  . Aspirin    . Esomeprazole magnesium      No family history on file.  History   Social History  . Marital Status: Single    Spouse Name: N/A    Number of Children: N/A  . Years of Education: N/A   Occupational History  . Not on file.   Social History Main Topics  . Smoking status: Current Everyday Smoker -- 0.5 packs/day    Types: Cigarettes  . Smokeless tobacco: Not on file  . Alcohol Use: No  . Drug Use: No  . Sexually Active: Not on file   Other Topics Concern  . Not on file   Social History Narrative  . No narrative on file      ROS:  General: Negative for anorexia, weight loss, fever, chills, fatigue, weakness. Eyes: Negative for vision changes.  ENT: Negative for hoarseness, difficulty swallowing , nasal congestion. CV: Negative for chest pain, angina, palpitations, dyspnea on exertion, peripheral edema.  Respiratory: Negative for dyspnea at rest, dyspnea on exertion, cough, sputum, wheezing.  GI: See history of present illness. GU:  Negative for dysuria, hematuria, urinary incontinence, urinary frequency, nocturnal urination.  MS: Negative for  joint pain, low back pain.  Derm: Negative for rash or itching.  Neuro: Negative for weakness, abnormal sensation, seizure, frequent headaches, memory loss, confusion.  Psych: Negative for anxiety, depression, suicidal ideation, hallucinations.  Endo: Negative for unusual weight change.  Heme: Negative for bruising or bleeding. Allergy: Negative for rash or hives.    Physical Examination: There were no vitals taken for this visit.   General: Well-nourished, well-developed in no acute distress.  Head: Normocephalic, atraumatic.   Eyes: Conjunctiva pink, no icterus. Mouth: Oropharyngeal mucosa moist and pink , no lesions erythema or exudate. Neck: Supple without thyromegaly, masses, or lymphadenopathy.  Lungs: Clear to auscultation bilaterally.  Heart: Regular rate and rhythm, no murmurs rubs or gallops.  Abdomen: Bowel sounds are normal, nontender, nondistended, no hepatosplenomegaly or masses, no abdominal bruits or    hernia , no rebound or guarding.   Extremities: No lower extremity edema.  Neuro: Alert and oriented x 4 , grossly normal neurologically.  Skin: Warm and dry, no rash or jaundice.   Psych: Alert and cooperative, normal mood and affect.

## 2011-01-29 NOTE — Assessment & Plan Note (Signed)
Doing well on Dexilant. Reviewed lifestyle and dietary measures. She is to continue her current regimen.

## 2011-02-01 ENCOUNTER — Other Ambulatory Visit: Payer: Self-pay | Admitting: Internal Medicine

## 2011-02-01 DIAGNOSIS — D649 Anemia, unspecified: Secondary | ICD-10-CM

## 2011-02-01 NOTE — Progress Notes (Signed)
Cc Neg IFOBT to Dr. Neita Carp

## 2011-02-01 NOTE — Progress Notes (Unsigned)
Informed pt after office visit that her ifobt was negative.  LW- please cc negative ifobt results to Dr. Neita Carp. thanks

## 2011-02-04 NOTE — Progress Notes (Signed)
Cc to Dr. Sasser 

## 2011-07-21 ENCOUNTER — Other Ambulatory Visit: Payer: Self-pay | Admitting: Internal Medicine

## 2011-07-22 LAB — CBC WITH DIFFERENTIAL/PLATELET
Eosinophils Relative: 2 % (ref 0–5)
HCT: 39 % (ref 36.0–46.0)
Hemoglobin: 12.2 g/dL (ref 12.0–15.0)
Lymphocytes Relative: 27 % (ref 12–46)
Lymphs Abs: 1.1 10*3/uL (ref 0.7–4.0)
MCV: 87.1 fL (ref 78.0–100.0)
Monocytes Absolute: 0.3 10*3/uL (ref 0.1–1.0)
Monocytes Relative: 8 % (ref 3–12)
RBC: 4.48 MIL/uL (ref 3.87–5.11)
RDW: 15.9 % — ABNORMAL HIGH (ref 11.5–15.5)
WBC: 4.1 10*3/uL (ref 4.0–10.5)

## 2011-12-10 LAB — COMPREHENSIVE METABOLIC PANEL
AST: 19 U/L
Alkaline Phosphatase: 88 U/L
Creat: 0.72
Hgb A1c MFr Bld: 8.4 % — AB (ref 4.0–6.0)
Total Bilirubin: 0.2 mg/dL

## 2012-01-18 ENCOUNTER — Encounter: Payer: Self-pay | Admitting: Internal Medicine

## 2012-03-13 ENCOUNTER — Ambulatory Visit: Payer: 59

## 2012-03-14 ENCOUNTER — Encounter: Payer: Self-pay | Admitting: Internal Medicine

## 2012-03-15 ENCOUNTER — Ambulatory Visit (INDEPENDENT_AMBULATORY_CARE_PROVIDER_SITE_OTHER): Payer: Medicare Other | Admitting: Gastroenterology

## 2012-03-15 ENCOUNTER — Encounter: Payer: Self-pay | Admitting: Gastroenterology

## 2012-03-15 VITALS — BP 128/59 | HR 79 | Temp 97.4°F | Ht 65.0 in | Wt 198.4 lb

## 2012-03-15 DIAGNOSIS — Q2730 Arteriovenous malformation, site unspecified: Secondary | ICD-10-CM | POA: Insufficient documentation

## 2012-03-15 DIAGNOSIS — K219 Gastro-esophageal reflux disease without esophagitis: Secondary | ICD-10-CM

## 2012-03-15 DIAGNOSIS — D509 Iron deficiency anemia, unspecified: Secondary | ICD-10-CM

## 2012-03-15 DIAGNOSIS — Q279 Congenital malformation of peripheral vascular system, unspecified: Secondary | ICD-10-CM

## 2012-03-15 NOTE — Patient Instructions (Addendum)
I will review your recent labs from Dr. Neita Carp. You should continue to have your hemoglobin checked at least twice yearly. He can have this done with Dr. Neita Carp. Please let us know if it starts to drift downward. Return to the office in 2 years.

## 2012-03-15 NOTE — Progress Notes (Signed)
Labs from 12/09/2011. BUN 12, creatinine 0.72, total bilirubin 0.2, alkaline phosphatase 88, AST 19, ALT 18, albumin 4.3, hemoglobin A1c 8.4. Per Dr. Dian Situ office, no recent CBC on file there.  Please have patient check CBC now.

## 2012-03-15 NOTE — Assessment & Plan Note (Signed)
Doing very well on Dexilant. OV in 2 years.

## 2012-03-15 NOTE — Progress Notes (Signed)
Primary Care Physician: Estanislado Pandy, MD  Primary Gastroenterologist:  Roetta Sessions, MD   Chief Complaint  Patient presents with  . Follow-up    HPI: Jenna [name] is a 66 y.o. female here for one year f/u. Last seen in 01/2011 by Dr. Jena Gauss. 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 Also had duodenal AVM ablated in 2011.  She presents today with no complaints. Denies constipation, diarrhea, melena, rectal bleeding, abdominal pain, vomiting, heartburn. She takes Dexilant every day. She believes that she had her hemoglobin checked with the last 3 months with Dr. Neita Carp. She states she was told to stop iron around that time.  Current Outpatient Prescriptions  Medication Sig Dispense Refill  . dexlansoprazole (DEXILANT) 60 MG capsule Take 60 mg by mouth daily. 1 BY MOUTH 30 MIN BEFORE BREAKFAST DAILY       . glipiZIDE (GLUCOTROL) 5 MG tablet Take 5 mg by mouth 2 (two) times daily before a meal.        . losartan (COZAAR) 100 MG tablet Take 100 mg by mouth daily.        . metFORMIN (GLUCOPHAGE) 500 MG tablet Take 500 mg by mouth 2 (two) times daily with a meal.        Allergies as of 03/15/2012 - Review Complete 03/15/2012  Allergen Reaction Noted  . Aspirin    . Esomeprazole magnesium     Past Surgical History  Procedure Date  . Tubal ligation   . Colonoscopy 10/24/2009    normal rectum/1X1cm abnormal lesion in the ascending colon (bx benign). TI normal for 10cm.    . Givens capsule study 07/27/2010     multiple arteriovenous malformations which could definitely be the contributor to her drifting hemoglobin and hematocrit  . Esophagogastroduodenoscopy 10/13/2004     Normal esophagus/ Nodular volcano like lesion in the antrum, either representing a  pancreatic rest or leiomyoma, biopsied.  Remainder of the gastric mucosa appeared normal, normal D1-D2  . Colonoscopy 10/13/2004    Normal rectum/Diminutive polyps, splenic  flexure, cold biopsied/removed.  Remainder of colonic mucosa appeared normal.  . Esophagogastroduodenoscopy 10/24/2009    normal esophagus/small hiatal hernia/nodular lesion antrum/distal greater curvature. duodenal AVM s/p ablation    ROS:  General: Negative for anorexia, weight loss, fever, chills, fatigue, weakness. ENT: Negative for hoarseness, difficulty swallowing , nasal congestion. CV: Negative for chest pain, angina, palpitations, dyspnea on exertion, peripheral edema.  Respiratory: Negative for dyspnea at rest, dyspnea on exertion, cough, sputum, wheezing.  GI: See history of present illness. GU:  Negative for dysuria, hematuria, urinary incontinence, urinary frequency, nocturnal urination.  Endo: Negative for unusual weight change.    Physical Examination:   BP 128/59  Pulse 79  Temp 97.4 F (36.3 C) (Temporal)  Ht 5\' 5"  (1.651 m)  Wt 198 lb 6.4 oz (89.994 kg)  BMI 33.02 kg/m2  General: Well-nourished, well-developed in no acute distress.  Eyes: No icterus. Mouth: Oropharyngeal mucosa moist and pink , no lesions erythema or exudate. Lungs: Clear to auscultation bilaterally.  Heart: Regular rate and rhythm, no murmurs rubs or gallops.  Abdomen: Bowel sounds are normal, nontender, nondistended, no hepatosplenomegaly or masses, no abdominal bruits or hernia , no rebound or guarding.   Extremities: No lower extremity edema. No clubbing or deformities. Neuro: Alert and oriented x 4   Skin: Warm and dry, no jaundice.   Psych: Alert and cooperative, normal mood and affect.

## 2012-03-15 NOTE — Assessment & Plan Note (Signed)
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 She believes she recently had her hemoglobin checked. We will request records from Dr. Neita Carp. If not done, we will check CBC.  She really needs to have CBC done on at least a yearly basis.

## 2012-03-15 NOTE — Progress Notes (Signed)
Faxed to PCP

## 2012-03-16 ENCOUNTER — Other Ambulatory Visit: Payer: Self-pay | Admitting: Gastroenterology

## 2012-03-16 ENCOUNTER — Other Ambulatory Visit: Payer: Self-pay

## 2012-03-16 DIAGNOSIS — D509 Iron deficiency anemia, unspecified: Secondary | ICD-10-CM

## 2012-03-16 NOTE — Progress Notes (Signed)
Pt aware, faxed order to lab. Pt said she would go by and have it done.

## 2012-04-03 LAB — CBC WITH DIFFERENTIAL/PLATELET
Basophils Relative: 1 % (ref 0–1)
Eosinophils Absolute: 0.1 10*3/uL (ref 0.0–0.7)
HCT: 36.1 % (ref 36.0–46.0)
Hemoglobin: 11.7 g/dL — ABNORMAL LOW (ref 12.0–15.0)
Lymphs Abs: 0.7 10*3/uL (ref 0.7–4.0)
MCH: 26.4 pg (ref 26.0–34.0)
MCHC: 32.4 g/dL (ref 30.0–36.0)
Monocytes Absolute: 0.3 10*3/uL (ref 0.1–1.0)
Monocytes Relative: 7 % (ref 3–12)
RBC: 4.44 MIL/uL (ref 3.87–5.11)

## 2012-04-04 ENCOUNTER — Other Ambulatory Visit: Payer: Self-pay | Admitting: Gastroenterology

## 2012-04-04 DIAGNOSIS — D649 Anemia, unspecified: Secondary | ICD-10-CM

## 2012-04-04 NOTE — Progress Notes (Signed)
Quick Note:  Pt aware, lab order on file. ______ 

## 2012-04-04 NOTE — Progress Notes (Signed)
Quick Note:  Some drop in H/H. I would recommend restart ferrous sulfate 325mg  daily.  Recheck CBC and ferritin in 6 months. ______

## 2012-04-27 NOTE — Progress Notes (Signed)
REVIEWED.  

## 2012-08-29 ENCOUNTER — Other Ambulatory Visit: Payer: Self-pay

## 2012-08-29 MED ORDER — DEXLANSOPRAZOLE 60 MG PO CPDR: 60.0000 mg | DELAYED_RELEASE_CAPSULE | Freq: Every day | ORAL | Status: DC

## 2012-09-04 ENCOUNTER — Other Ambulatory Visit: Payer: Self-pay

## 2012-09-04 DIAGNOSIS — D649 Anemia, unspecified: Secondary | ICD-10-CM

## 2012-10-03 LAB — CBC WITH DIFFERENTIAL/PLATELET
Basophils Absolute: 0 10*3/uL (ref 0.0–0.1)
Basophils Relative: 1 % (ref 0–1)
Eosinophils Relative: 1 % (ref 0–5)
HCT: 38 % (ref 36.0–46.0)
Lymphocytes Relative: 25 % (ref 12–46)
MCHC: 32.4 g/dL (ref 30.0–36.0)
MCV: 83.5 fL (ref 78.0–100.0)
Monocytes Absolute: 0.2 10*3/uL (ref 0.1–1.0)
Platelets: 260 10*3/uL (ref 150–400)
RDW: 16.6 % — ABNORMAL HIGH (ref 11.5–15.5)
WBC: 4.4 10*3/uL (ref 4.0–10.5)

## 2012-10-11 NOTE — Progress Notes (Signed)
Quick Note:  Ferritin and Hgb normal. Good news.  Return as planned in approximately 2 years. Recheck CBC and ferritin in 1 year. ______

## 2012-10-23 ENCOUNTER — Encounter: Payer: Self-pay | Admitting: Internal Medicine

## 2012-11-23 ENCOUNTER — Telehealth: Payer: Self-pay

## 2012-11-23 ENCOUNTER — Other Ambulatory Visit: Payer: Self-pay | Admitting: Gastroenterology

## 2012-11-23 ENCOUNTER — Encounter: Payer: Self-pay | Admitting: Gastroenterology

## 2012-11-23 ENCOUNTER — Ambulatory Visit (INDEPENDENT_AMBULATORY_CARE_PROVIDER_SITE_OTHER): Payer: Medicaid Other | Admitting: Gastroenterology

## 2012-11-23 VITALS — BP 155/70 | HR 86 | Temp 98.2°F | Ht 65.0 in | Wt 203.8 lb

## 2012-11-23 DIAGNOSIS — D509 Iron deficiency anemia, unspecified: Secondary | ICD-10-CM

## 2012-11-23 DIAGNOSIS — Z1211 Encounter for screening for malignant neoplasm of colon: Secondary | ICD-10-CM

## 2012-11-23 MED ORDER — SOD PICOSULFATE-MAG OX-CIT ACD 10-3.5-12 MG-GM-GM PO PACK: 1.0000 | PACK | ORAL | Status: DC

## 2012-11-23 NOTE — Patient Instructions (Addendum)
We have scheduled you for a colonoscopy with Dr. Rourk. Please see separate instructions. 

## 2012-11-23 NOTE — Telephone Encounter (Signed)
Miralax prep is ok.

## 2012-11-23 NOTE — Progress Notes (Signed)
Cc PCP 

## 2012-11-23 NOTE — Telephone Encounter (Signed)
Faxed patient her Miralax Prep

## 2012-11-23 NOTE — Progress Notes (Signed)
Please arrange for CBC, ferritin in 09/2013, followed by OV.

## 2012-11-23 NOTE — Assessment & Plan Note (Signed)
Due for screening colonoscopy.  I have discussed the risks, alternatives, benefits with regards to but not limited to the risk of reaction to medication, bleeding, infection, perforation and the patient is agreeable to proceed. Written consent to be obtained.  H/O IDA, H/H and ferritin normal in 09/2012. Previously felt to be secondary to SB AVMs. Would advise periodic check of her H/H, at least once per year. Recheck CBC, ferritin in 09/2013.

## 2012-11-23 NOTE — Telephone Encounter (Signed)
Patient is now asking for Miralax Prep is that sufficient?

## 2012-11-23 NOTE — Telephone Encounter (Signed)
Pt called and said she could not afford the Prepopik that was given at $90.00.

## 2012-11-23 NOTE — Progress Notes (Signed)
Primary Care Physician:  Estanislado Pandy, MD  Primary Gastroenterologist:  Roetta Sessions, MD    Chief Complaint  Patient presents with  . Colonoscopy    HPI:  Jenna [name] is a 67 y.o. female here to schedule colonoscopy. Her last one was in 10/2009. Her prep was inadequate and difficult therefore she was advised to have repeat colonoscopy 10/2012. See below for findings. She also has h/o IDA with known SB AVMs but her H/H has been stable/normal for last one year.  Denies abdominal pain, constipation, diarrhea, melena, brbpr. Heartburn well-controlled. No dysphagia, unintentional weight loss.   Current Outpatient Prescriptions  Medication Sig Dispense Refill  . dexlansoprazole (DEXILANT) 60 MG capsule Take 1 capsule (60 mg total) by mouth daily. 1 BY MOUTH 30 MIN BEFORE BREAKFAST DAILY  31 capsule  5  . glipiZIDE (GLUCOTROL) 5 MG tablet Take 5 mg by mouth 2 (two) times daily before a meal.        . losartan (COZAAR) 100 MG tablet Take 100 mg by mouth daily.        . metFORMIN (GLUCOPHAGE) 500 MG tablet Take 500 mg by mouth 2 (two) times daily with a meal.      . simvastatin (ZOCOR) 20 MG tablet Take 20 mg by mouth every evening.       No current facility-administered medications for this visit.    Allergies as of 11/23/2012 - Review Complete 11/23/2012  Allergen Reaction Noted  . Aspirin    . Esomeprazole magnesium      Past Medical History  Diagnosis Date  . Hypertension   . GERD (gastroesophageal reflux disease)   . Iron deficiency anemia   . DM (diabetes mellitus)     Past Surgical History  Procedure Laterality Date  . Tubal ligation    . Colonoscopy  10/24/2009    normal rectum/1X1cm abnormal lesion in the ascending colon (bx benign). TI normal for 10cm.    . Givens capsule study  07/27/2010     multiple arteriovenous malformations which could definitely be the contributor to her drifting hemoglobin and hematocrit  . Esophagogastroduodenoscopy  10/13/2004   Normal esophagus/ Nodular volcano like lesion in the antrum, either representing a  pancreatic rest or leiomyoma, biopsied.  Remainder of the gastric mucosa appeared normal, normal D1-D2  . Colonoscopy  10/13/2004    Normal rectum/Diminutive polyps, splenic flexure, cold biopsied/removed.  Remainder of colonic mucosa appeared normal.  . Esophagogastroduodenoscopy  10/24/2009    normal esophagus/small hiatal hernia/nodular lesion antrum/distal greater curvature. duodenal AVM s/p ablation    Family History  Problem Relation Age of Onset  . Colon cancer Maternal Aunt     greater than age 40  . Breast cancer Cousin     History   Social History  . Marital Status: Single    Spouse Name: N/A    Number of Children: N/A  . Years of Education: N/A   Occupational History  . Not on file.   Social History Main Topics  . Smoking status: Current Every Day Smoker -- 0.50 packs/day    Types: Cigarettes  . Smokeless tobacco: Not on file  . Alcohol Use: No  . Drug Use: No  . Sexually Active: Not on file   Other Topics Concern  . Not on file   Social History Narrative  . No narrative on file      ROS:  General: Negative for anorexia, weight loss, fever, chills, fatigue, weakness. Eyes: Negative for vision changes.  ENT: Negative  for hoarseness, difficulty swallowing , nasal congestion. CV: Negative for chest pain, angina, palpitations, dyspnea on exertion, peripheral edema.  Respiratory: Negative for dyspnea at rest, dyspnea on exertion, cough, sputum, wheezing.  GI: See history of present illness. GU:  Negative for dysuria, hematuria, urinary incontinence, urinary frequency, nocturnal urination.  MS: Negative for joint pain, low back pain.  Derm: Negative for rash or itching.  Neuro: Negative for weakness, abnormal sensation, seizure, frequent headaches, memory loss, confusion.  Psych: Negative for anxiety, depression, suicidal ideation, hallucinations.  Endo: Negative for unusual  weight change.  Heme: Negative for bruising or bleeding. Allergy: Negative for rash or hives.    Physical Examination:  BP 155/70  Pulse 86  Temp(Src) 98.2 F (36.8 C) (Oral)  Ht 5\' 5"  (1.651 m)  Wt 203 lb 12.8 oz (92.443 kg)  BMI 33.91 kg/m2   General: Well-nourished, well-developed in no acute distress.  Head: Normocephalic, atraumatic.   Eyes: Conjunctiva pink, no icterus. Mouth: Oropharyngeal mucosa moist and pink , no lesions erythema or exudate. Neck: Supple without thyromegaly, masses, or lymphadenopathy.  Lungs: Clear to auscultation bilaterally.  Heart: Regular rate and rhythm, no murmurs rubs or gallops.  Abdomen: Bowel sounds are normal, nontender, nondistended, no hepatosplenomegaly or masses, no abdominal bruits or    hernia , no rebound or guarding.   Rectal: defer Extremities: No lower extremity edema. No clubbing or deformities.  Neuro: Alert and oriented x 4 , grossly normal neurologically.  Skin: Warm and dry, no rash or jaundice.   Psych: Alert and cooperative, normal mood and affect.  Labs: Lab Results  Component Value Date   WBC 4.4 10/02/2012   HGB 12.3 10/02/2012   HCT 38.0 10/02/2012   MCV 83.5 10/02/2012   PLT 260 10/02/2012   Lab Results  Component Value Date   FERRITIN 63 10/02/2012    Imaging Studies: No results found.

## 2012-12-07 ENCOUNTER — Encounter (HOSPITAL_COMMUNITY): Payer: Self-pay | Admitting: Pharmacy Technician

## 2012-12-14 ENCOUNTER — Ambulatory Visit (HOSPITAL_COMMUNITY)
Admission: RE | Admit: 2012-12-14 | Discharge: 2012-12-14 | Disposition: A | Discharge status: Home / Self Care | Payer: Medicare Other | Source: Ambulatory Visit | Attending: Internal Medicine | Admitting: Internal Medicine

## 2012-12-14 ENCOUNTER — Encounter (HOSPITAL_COMMUNITY)
Admission: RE | Disposition: A | Discharge status: Home / Self Care | Payer: Self-pay | Source: Ambulatory Visit | Attending: Internal Medicine

## 2012-12-14 ENCOUNTER — Encounter (HOSPITAL_COMMUNITY): Payer: Self-pay | Admitting: *Deleted

## 2012-12-14 DIAGNOSIS — I10 Essential (primary) hypertension: Secondary | ICD-10-CM | POA: Insufficient documentation

## 2012-12-14 DIAGNOSIS — Z1211 Encounter for screening for malignant neoplasm of colon: Secondary | ICD-10-CM

## 2012-12-14 DIAGNOSIS — D126 Benign neoplasm of colon, unspecified: Secondary | ICD-10-CM

## 2012-12-14 DIAGNOSIS — Z01812 Encounter for preprocedural laboratory examination: Secondary | ICD-10-CM | POA: Insufficient documentation

## 2012-12-14 DIAGNOSIS — E119 Type 2 diabetes mellitus without complications: Secondary | ICD-10-CM | POA: Insufficient documentation

## 2012-12-14 HISTORY — PX: COLONOSCOPY: SHX5424

## 2012-12-14 LAB — GLUCOSE, CAPILLARY: Glucose-Capillary: 216 mg/dL — ABNORMAL HIGH (ref 70–99)

## 2012-12-14 SURGERY — COLONOSCOPY
Anesthesia: Moderate Sedation

## 2012-12-14 MED ORDER — MEPERIDINE HCL 100 MG/ML IJ SOLN
INTRAMUSCULAR | Status: AC
  Filled 2012-12-14: qty 1

## 2012-12-14 MED ORDER — SODIUM CHLORIDE 0.9 % IV SOLN
INTRAVENOUS | Status: DC
  Administered 2012-12-14: 10:00:00 via INTRAVENOUS

## 2012-12-14 MED ORDER — MIDAZOLAM HCL 5 MG/5ML IJ SOLN
INTRAMUSCULAR | Status: DC | PRN
  Administered 2012-12-14 (×2): 1 mg via INTRAVENOUS
  Administered 2012-12-14: 2 mg via INTRAVENOUS

## 2012-12-14 MED ORDER — MIDAZOLAM HCL 5 MG/5ML IJ SOLN
INTRAMUSCULAR | Status: AC
  Filled 2012-12-14: qty 10

## 2012-12-14 MED ORDER — ONDANSETRON HCL 4 MG/2ML IJ SOLN
INTRAMUSCULAR | Status: DC | PRN
  Administered 2012-12-14: 4 mg via INTRAVENOUS

## 2012-12-14 MED ORDER — ONDANSETRON HCL 4 MG/2ML IJ SOLN
INTRAMUSCULAR | Status: AC
  Filled 2012-12-14: qty 2

## 2012-12-14 MED ORDER — MEPERIDINE HCL 100 MG/ML IJ SOLN
INTRAMUSCULAR | Status: DC | PRN
  Administered 2012-12-14: 25 mg via INTRAVENOUS
  Administered 2012-12-14: 50 mg via INTRAVENOUS
  Administered 2012-12-14: 25 mg via INTRAVENOUS

## 2012-12-14 MED ORDER — STERILE WATER FOR IRRIGATION IR SOLN
Status: DC | PRN
  Administered 2012-12-14: 11:00:00

## 2012-12-14 NOTE — Interval H&P Note (Signed)
History and Physical Interval Note:  12/14/2012 11:16 AM  Jenna [name]  has presented today for surgery, with the diagnosis of SCREENING COLONOSCOPY  The various methods of treatment have been discussed with the patient and family. After consideration of risks, benefits and other options for treatment, the patient has consented to  Procedure(s) with comments: COLONOSCOPY (N/A) - 10:30 as a surgical intervention .  The patient's history has been reviewed, patient examined, no change in status, stable for surgery.  I have reviewed the patient's chart and labs.  Questions were answered to the patient's satisfaction.     Eula Listen  Colonoscopy for screening purposes.The risks, benefits, limitations, alternatives and imponderables have been reviewed with the patient. Questions have been answered. All parties are agreeable.

## 2012-12-14 NOTE — Op Note (Signed)
[facility] [address] [place], [address]   COLONOSCOPY PROCEDURE REPORT  PATIENT: Jenna [name], Jenna [name]  MR#:         [id number] BIRTHDATE: 01/14/1946 , 66  yrs. old GENDER: Female ENDOSCOPIST: R.  Roetta Sessions, MD FACP FACG REFERRED BY:  Fara Chute, M.D. PROCEDURE DATE:  12/14/2012 PROCEDURE:     Colonoscopy with snare polypectomy  INDICATIONS:  colorectal cancer screening  INFORMED CONSENT:  The risks, benefits, alternatives and imponderables including but not limited to bleeding, perforation as well as the possibility of a missed lesion have been reviewed.  The potential for biopsy, lesion removal, etc. have also been discussed.  Questions have been answered.  All parties agreeable. Please see the history and physical in the medical record for more information.  MEDICATIONS: Versed 4 mg ; Demerol 00 mg IV in divided doses. Zofran 4 mg IV.  DESCRIPTION OF PROCEDURE:  After a digital rectal exam was performed, the EC-[id number] ([id number])  colonoscope was advanced from the anus through the rectum and colon to the area of the cecum, ileocecal valve and appendiceal orifice.  The cecum was deeply intubated.  These structures were well-seen and photographed for the record.  From the level of the cecum and ileocecal valve, the scope was slowly and cautiously withdrawn.  The mucosal surfaces were carefully surveyed utilizing scope tip deflection to facilitate fold flattening as needed.  The scope was pulled down into the rectum where a thorough examination including retroflexion was performed.    FINDINGS:  Adequate preparation. Normal rectum. 4 mm pedunculated polyp at the splenic flexure; otherwise, the remainder of the colonic mucosa appeared normal.  THERAPEUTIC / DIAGNOSTIC MANEUVERS PERFORMED:  The above-mentioned polyp was cold snare removed  COMPLICATIONS: none  CECAL WITHDRAWAL TIME:  9 minutes  IMPRESSION:  Colonic polyp-removed as described  above  RECOMMENDATIONS:   Followup on pathology.   _______________________________ eSigned:  R. Roetta Sessions, MD FACP [facility] 12/14/2012 12:02 PM   CC:

## 2012-12-14 NOTE — H&P (View-Only) (Signed)
Primary Care Physician:  SASSER,PAUL W, MD  Primary Gastroenterologist:  Michael Rourk, MD    Chief Complaint  Patient presents with  . Colonoscopy    HPI:  Jenna [name] is a 66 y.o. female here to schedule colonoscopy. Her last one was in 10/2009. Her prep was inadequate and difficult therefore she was advised to have repeat colonoscopy 10/2012. See below for findings. She also has h/o IDA with known SB AVMs but her H/H has been stable/normal for last one year.  Denies abdominal pain, constipation, diarrhea, melena, brbpr. Heartburn well-controlled. No dysphagia, unintentional weight loss.   Current Outpatient Prescriptions  Medication Sig Dispense Refill  . dexlansoprazole (DEXILANT) 60 MG capsule Take 1 capsule (60 mg total) by mouth daily. 1 BY MOUTH 30 MIN BEFORE BREAKFAST DAILY  31 capsule  5  . glipiZIDE (GLUCOTROL) 5 MG tablet Take 5 mg by mouth 2 (two) times daily before a meal.        . losartan (COZAAR) 100 MG tablet Take 100 mg by mouth daily.        . metFORMIN (GLUCOPHAGE) 500 MG tablet Take 500 mg by mouth 2 (two) times daily with a meal.      . simvastatin (ZOCOR) 20 MG tablet Take 20 mg by mouth every evening.       No current facility-administered medications for this visit.    Allergies as of 11/23/2012 - Review Complete 11/23/2012  Allergen Reaction Noted  . Aspirin    . Esomeprazole magnesium      Past Medical History  Diagnosis Date  . Hypertension   . GERD (gastroesophageal reflux disease)   . Iron deficiency anemia   . DM (diabetes mellitus)     Past Surgical History  Procedure Laterality Date  . Tubal ligation    . Colonoscopy  10/24/2009    normal rectum/1X1cm abnormal lesion in the ascending colon (bx benign). TI normal for 10cm.    . Givens capsule study  07/27/2010     multiple arteriovenous malformations which could definitely be the contributor to her drifting hemoglobin and hematocrit  . Esophagogastroduodenoscopy  10/13/2004   Normal esophagus/ Nodular volcano like lesion in the antrum, either representing a  pancreatic rest or leiomyoma, biopsied.  Remainder of the gastric mucosa appeared normal, normal D1-D2  . Colonoscopy  10/13/2004    Normal rectum/Diminutive polyps, splenic flexure, cold biopsied/removed.  Remainder of colonic mucosa appeared normal.  . Esophagogastroduodenoscopy  10/24/2009    normal esophagus/small hiatal hernia/nodular lesion antrum/distal greater curvature. duodenal AVM s/p ablation    Family History  Problem Relation Age of Onset  . Colon cancer Maternal Aunt     greater than age 60  . Breast cancer Cousin     History   Social History  . Marital Status: Single    Spouse Name: N/A    Number of Children: N/A  . Years of Education: N/A   Occupational History  . Not on file.   Social History Main Topics  . Smoking status: Current Every Day Smoker -- 0.50 packs/day    Types: Cigarettes  . Smokeless tobacco: Not on file  . Alcohol Use: No  . Drug Use: No  . Sexually Active: Not on file   Other Topics Concern  . Not on file   Social History Narrative  . No narrative on file      ROS:  General: Negative for anorexia, weight loss, fever, chills, fatigue, weakness. Eyes: Negative for vision changes.  ENT: Negative   for hoarseness, difficulty swallowing , nasal congestion. CV: Negative for chest pain, angina, palpitations, dyspnea on exertion, peripheral edema.  Respiratory: Negative for dyspnea at rest, dyspnea on exertion, cough, sputum, wheezing.  GI: See history of present illness. GU:  Negative for dysuria, hematuria, urinary incontinence, urinary frequency, nocturnal urination.  MS: Negative for joint pain, low back pain.  Derm: Negative for rash or itching.  Neuro: Negative for weakness, abnormal sensation, seizure, frequent headaches, memory loss, confusion.  Psych: Negative for anxiety, depression, suicidal ideation, hallucinations.  Endo: Negative for unusual  weight change.  Heme: Negative for bruising or bleeding. Allergy: Negative for rash or hives.    Physical Examination:  BP 155/70  Pulse 86  Temp(Src) 98.2 F (36.8 C) (Oral)  Ht 5' 5" (1.651 m)  Wt 203 lb 12.8 oz (92.443 kg)  BMI 33.91 kg/m2   General: Well-nourished, well-developed in no acute distress.  Head: Normocephalic, atraumatic.   Eyes: Conjunctiva pink, no icterus. Mouth: Oropharyngeal mucosa moist and pink , no lesions erythema or exudate. Neck: Supple without thyromegaly, masses, or lymphadenopathy.  Lungs: Clear to auscultation bilaterally.  Heart: Regular rate and rhythm, no murmurs rubs or gallops.  Abdomen: Bowel sounds are normal, nontender, nondistended, no hepatosplenomegaly or masses, no abdominal bruits or    hernia , no rebound or guarding.   Rectal: defer Extremities: No lower extremity edema. No clubbing or deformities.  Neuro: Alert and oriented x 4 , grossly normal neurologically.  Skin: Warm and dry, no rash or jaundice.   Psych: Alert and cooperative, normal mood and affect.  Labs: Lab Results  Component Value Date   WBC 4.4 10/02/2012   HGB 12.3 10/02/2012   HCT 38.0 10/02/2012   MCV 83.5 10/02/2012   PLT 260 10/02/2012   Lab Results  Component Value Date   FERRITIN 63 10/02/2012    Imaging Studies: No results found.    

## 2012-12-18 ENCOUNTER — Encounter: Payer: Self-pay | Admitting: Internal Medicine

## 2012-12-18 ENCOUNTER — Encounter (HOSPITAL_COMMUNITY): Payer: Self-pay | Admitting: Internal Medicine

## 2013-03-27 ENCOUNTER — Other Ambulatory Visit: Payer: Self-pay

## 2013-03-27 MED ORDER — DEXLANSOPRAZOLE 60 MG PO CPDR: DELAYED_RELEASE_CAPSULE | ORAL | Status: DC

## 2013-09-06 ENCOUNTER — Other Ambulatory Visit: Payer: Self-pay

## 2013-09-06 DIAGNOSIS — D509 Iron deficiency anemia, unspecified: Secondary | ICD-10-CM

## 2013-09-14 ENCOUNTER — Encounter (INDEPENDENT_AMBULATORY_CARE_PROVIDER_SITE_OTHER): Payer: Self-pay

## 2013-09-14 ENCOUNTER — Encounter: Payer: Self-pay | Admitting: Gastroenterology

## 2013-09-14 ENCOUNTER — Ambulatory Visit (INDEPENDENT_AMBULATORY_CARE_PROVIDER_SITE_OTHER): Payer: Medicare Other | Admitting: Gastroenterology

## 2013-09-14 VITALS — BP 147/73 | HR 79 | Temp 97.8°F | Ht 65.0 in | Wt 192.0 lb

## 2013-09-14 DIAGNOSIS — Q279 Congenital malformation of peripheral vascular system, unspecified: Secondary | ICD-10-CM

## 2013-09-14 DIAGNOSIS — D509 Iron deficiency anemia, unspecified: Secondary | ICD-10-CM

## 2013-09-14 DIAGNOSIS — Q2730 Arteriovenous malformation, site unspecified: Secondary | ICD-10-CM

## 2013-09-14 DIAGNOSIS — K219 Gastro-esophageal reflux disease without esophagitis: Secondary | ICD-10-CM

## 2013-09-14 NOTE — Progress Notes (Signed)
      Primary Care Physician: Manon Hilding, MD  Primary Gastroenterologist:  Garfield Cornea, MD   Chief Complaint  Patient presents with  . Follow-up    HPI: Jenna [name] is a 68 y.o. female here for followup. She had colonoscopy back in May of 2014.  Tubular adenoma removed. She has a history of small bowel AVMs. Also chronic GERD. Having diarrhea with metformin. Decreased dose has helped some. Having 4-5 stools per day. She will not take her metformin 1 mornings prior to having to go out because of fear of diarrhea. Therefore frequently she only takes her afternoon dose. She follows up with her PCP in May and plans to discuss switching medication altogether. No melena, brbpr. Some bloating. Intentional 9 pound weight loss since her last.   Current Outpatient Prescriptions  Medication Sig Dispense Refill  . dexlansoprazole (DEXILANT) 60 MG capsule 1 BY MOUTH 30 MIN BEFORE BREAKFAST DAILY  30 capsule  11  . glipiZIDE (GLUCOTROL) 5 MG tablet Take 5 mg by mouth 2 (two) times daily before a meal.        . losartan (COZAAR) 100 MG tablet Take 100 mg by mouth daily.        . metFORMIN (GLUCOPHAGE) 500 MG tablet Take 500 mg by mouth 2 (two) times daily with a meal.      . simvastatin (ZOCOR) 20 MG tablet Take 20 mg by mouth every evening.       No current facility-administered medications for this visit.    Allergies as of 09/14/2013 - Review Complete 09/14/2013  Allergen Reaction Noted  . Aspirin    . Esomeprazole magnesium      ROS:  General: Negative for anorexia, weight loss, fever, chills, fatigue, weakness. ENT: Negative for hoarseness, difficulty swallowing , nasal congestion. CV: Negative for chest pain, angina, palpitations, dyspnea on exertion, peripheral edema.  Respiratory: Negative for dyspnea at rest, dyspnea on exertion, cough, sputum, wheezing.  GI: See history of present illness. GU:  Negative for dysuria, hematuria, urinary incontinence, urinary frequency,  nocturnal urination.  Endo: Negative for unusual weight change.    Physical Examination:   BP 147/73  Pulse 79  Temp(Src) 97.8 F (36.6 C) (Oral)  Ht 5\' 5"  (1.651 m)  Wt 192 lb (87.091 kg)  BMI 31.95 kg/m2  General: Well-nourished, well-developed in no acute distress.  Eyes: No icterus. Mouth: Oropharyngeal mucosa moist and pink , no lesions erythema or exudate. Lungs: Clear to auscultation bilaterally.  Heart: Regular rate and rhythm, no murmurs rubs or gallops.  Abdomen: Bowel sounds are normal, nontender, nondistended, no hepatosplenomegaly or masses, no abdominal bruits or hernia , no rebound or guarding.   Extremities: No lower extremity edema. No clubbing or deformities. Neuro: Alert and oriented x 4   Skin: Warm and dry, no jaundice.   Psych: Alert and cooperative, normal mood and affect.

## 2013-09-14 NOTE — Assessment & Plan Note (Signed)
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 On chronic iron therapy. Due for labs at this time. Recommend checking at least on a yearly basis.

## 2013-09-14 NOTE — Patient Instructions (Signed)
1. Please have your blood work done as scheduled next week. 2. Consider discussing possibility of discontinuing metformin with Dr. Quintin Alto given bowel issues. You may try Imodium half to one tablet 3 times a day as needed. 3. Office visit in one year.

## 2013-09-14 NOTE — Assessment & Plan Note (Signed)
Doing well Continue PPI 

## 2013-09-18 LAB — CBC WITH DIFFERENTIAL/PLATELET
BASOS ABS: 0 10*3/uL (ref 0.0–0.1)
Basophils Relative: 0 % (ref 0–1)
EOS ABS: 0.1 10*3/uL (ref 0.0–0.7)
EOS PCT: 1 % (ref 0–5)
HEMATOCRIT: 34.1 % — AB (ref 36.0–46.0)
Hemoglobin: 10.8 g/dL — ABNORMAL LOW (ref 12.0–15.0)
LYMPHS PCT: 22 % (ref 12–46)
Lymphs Abs: 0.9 10*3/uL (ref 0.7–4.0)
MCH: 26.8 pg (ref 26.0–34.0)
MCHC: 31.7 g/dL (ref 30.0–36.0)
MCV: 84.6 fL (ref 78.0–100.0)
MONO ABS: 0.3 10*3/uL (ref 0.1–1.0)
Monocytes Relative: 6 % (ref 3–12)
Neutro Abs: 3 10*3/uL (ref 1.7–7.7)
Neutrophils Relative %: 71 % (ref 43–77)
Platelets: 231 10*3/uL (ref 150–400)
RBC: 4.03 MIL/uL (ref 3.87–5.11)
RDW: 16.5 % — AB (ref 11.5–15.5)
WBC: 4.2 10*3/uL (ref 4.0–10.5)

## 2013-09-18 LAB — FERRITIN: Ferritin: 34 ng/mL (ref 10–291)

## 2013-09-18 NOTE — Progress Notes (Signed)
cc'd to pcp 

## 2013-10-01 ENCOUNTER — Other Ambulatory Visit: Payer: Self-pay

## 2013-10-01 DIAGNOSIS — D509 Iron deficiency anemia, unspecified: Secondary | ICD-10-CM

## 2013-10-01 DIAGNOSIS — K219 Gastro-esophageal reflux disease without esophagitis: Secondary | ICD-10-CM

## 2013-10-04 ENCOUNTER — Other Ambulatory Visit: Payer: Self-pay

## 2013-10-04 DIAGNOSIS — K219 Gastro-esophageal reflux disease without esophagitis: Secondary | ICD-10-CM

## 2013-10-04 DIAGNOSIS — D509 Iron deficiency anemia, unspecified: Secondary | ICD-10-CM

## 2013-11-09 LAB — CBC WITH DIFFERENTIAL/PLATELET
Basophils Absolute: 0 10*3/uL (ref 0.0–0.1)
Basophils Relative: 1 % (ref 0–1)
EOS ABS: 0.1 10*3/uL (ref 0.0–0.7)
EOS PCT: 2 % (ref 0–5)
HCT: 36 % (ref 36.0–46.0)
Hemoglobin: 11.1 g/dL — ABNORMAL LOW (ref 12.0–15.0)
LYMPHS ABS: 1 10*3/uL (ref 0.7–4.0)
Lymphocytes Relative: 26 % (ref 12–46)
MCH: 26.4 pg (ref 26.0–34.0)
MCHC: 30.8 g/dL (ref 30.0–36.0)
MCV: 85.7 fL (ref 78.0–100.0)
MONOS PCT: 6 % (ref 3–12)
Monocytes Absolute: 0.2 10*3/uL (ref 0.1–1.0)
Neutro Abs: 2.6 10*3/uL (ref 1.7–7.7)
Neutrophils Relative %: 65 % (ref 43–77)
PLATELETS: 226 10*3/uL (ref 150–400)
RBC: 4.2 MIL/uL (ref 3.87–5.11)
RDW: 16.7 % — ABNORMAL HIGH (ref 11.5–15.5)
WBC: 4 10*3/uL (ref 4.0–10.5)

## 2013-11-10 LAB — FERRITIN: Ferritin: 26 ng/mL (ref 10–291)

## 2014-01-16 ENCOUNTER — Ambulatory Visit (INDEPENDENT_AMBULATORY_CARE_PROVIDER_SITE_OTHER): Payer: Medicare Other | Admitting: Gastroenterology

## 2014-01-16 DIAGNOSIS — D649 Anemia, unspecified: Secondary | ICD-10-CM

## 2014-01-16 LAB — IFOBT (OCCULT BLOOD): IFOBT: POSITIVE

## 2014-01-16 NOTE — Progress Notes (Signed)
Pt return IFOBT test and it was positive.

## 2014-01-20 NOTE — Progress Notes (Signed)
Quick Note:  ifobt positive (h/o AVMs of SB, TCS 2014/EGD 2011). Due for CBC, ferritin. Is she taken iron regularly? ______

## 2014-02-01 ENCOUNTER — Other Ambulatory Visit: Payer: Self-pay | Admitting: Gastroenterology

## 2014-02-01 ENCOUNTER — Other Ambulatory Visit: Payer: Self-pay

## 2014-02-01 DIAGNOSIS — D509 Iron deficiency anemia, unspecified: Secondary | ICD-10-CM

## 2014-03-09 LAB — CBC WITH DIFFERENTIAL/PLATELET
Basophils Absolute: 0 10*3/uL (ref 0.0–0.1)
Basophils Relative: 0 % (ref 0–1)
EOS ABS: 0 10*3/uL (ref 0.0–0.7)
EOS PCT: 1 % (ref 0–5)
HCT: 34.7 % — ABNORMAL LOW (ref 36.0–46.0)
HEMOGLOBIN: 11 g/dL — AB (ref 12.0–15.0)
LYMPHS ABS: 1.1 10*3/uL (ref 0.7–4.0)
Lymphocytes Relative: 22 % (ref 12–46)
MCH: 26.3 pg (ref 26.0–34.0)
MCHC: 31.7 g/dL (ref 30.0–36.0)
MCV: 83 fL (ref 78.0–100.0)
MONO ABS: 0.2 10*3/uL (ref 0.1–1.0)
MONOS PCT: 5 % (ref 3–12)
Neutro Abs: 3.5 10*3/uL (ref 1.7–7.7)
Neutrophils Relative %: 72 % (ref 43–77)
Platelets: 226 10*3/uL (ref 150–400)
RBC: 4.18 MIL/uL (ref 3.87–5.11)
RDW: 15.8 % — ABNORMAL HIGH (ref 11.5–15.5)
WBC: 4.9 10*3/uL (ref 4.0–10.5)

## 2014-03-09 LAB — FERRITIN: Ferritin: 20 ng/mL (ref 10–291)

## 2014-03-12 NOTE — Progress Notes (Signed)
Quick Note:  Anemia stable. ifobt + back in 01/2014 but likely due to known SB Avms.  Make sure she is on ferrous sulfate 325mg  BID Repeat ferritin, cbc in 3 months. OV with RMR only in 3 months. ______

## 2014-03-25 ENCOUNTER — Other Ambulatory Visit: Payer: Self-pay | Admitting: Gastroenterology

## 2014-03-25 DIAGNOSIS — D509 Iron deficiency anemia, unspecified: Secondary | ICD-10-CM

## 2014-05-27 ENCOUNTER — Telehealth: Payer: Self-pay | Admitting: Internal Medicine

## 2014-05-27 ENCOUNTER — Encounter: Payer: Self-pay | Admitting: Internal Medicine

## 2014-05-27 NOTE — Telephone Encounter (Signed)
PATIENT ON November  RECALL LIST FOR LABS

## 2014-05-28 NOTE — Telephone Encounter (Signed)
Lab orders on file. 

## 2014-05-29 ENCOUNTER — Other Ambulatory Visit: Payer: Self-pay

## 2014-05-29 DIAGNOSIS — D509 Iron deficiency anemia, unspecified: Secondary | ICD-10-CM

## 2014-06-25 ENCOUNTER — Telehealth: Payer: Self-pay | Admitting: Internal Medicine

## 2014-06-25 NOTE — Telephone Encounter (Signed)
Pt received a letter that she is due a follow up with RMR and she will only see RMR only. She is on the [facility] recall list and is to have labs done prior to [place]. I offered her OV with RMR on 12/22, but she can't come because she has another appt in [place] that day. I offered OV on 12/29 with RMR and she said that she would have to check when her eye doctor's appt is due and she made it clear that if the weather is bad with snow and ice that she would not be coming out in it. I told her to call the eye doctor to find out when she needs to see them and to call me back and let me know if she can come on 12/29. Please advise if she has gotten anything from [place] regarding getting her labs done prior to [place].

## 2014-06-26 NOTE — Telephone Encounter (Signed)
noted 

## 2014-06-26 NOTE — Telephone Encounter (Signed)
Patient called back again today to let me know that she can come on 12/29 to see RMR and wanted to know could she have her labs done in [place] and have them mail the results to [place]. I told her that would be fine.

## 2014-07-02 LAB — CBC WITH DIFFERENTIAL/PLATELET
BASOS PCT: 0 % (ref 0–1)
Basophils Absolute: 0 10*3/uL (ref 0.0–0.1)
EOS ABS: 0.1 10*3/uL (ref 0.0–0.7)
Eosinophils Relative: 1 % (ref 0–5)
HEMATOCRIT: 36.6 % (ref 36.0–46.0)
HEMOGLOBIN: 11.4 g/dL — AB (ref 12.0–15.0)
Lymphocytes Relative: 20 % (ref 12–46)
Lymphs Abs: 1.1 10*3/uL (ref 0.7–4.0)
MCH: 25.7 pg — AB (ref 26.0–34.0)
MCHC: 31.1 g/dL (ref 30.0–36.0)
MCV: 82.6 fL (ref 78.0–100.0)
MONO ABS: 0.2 10*3/uL (ref 0.1–1.0)
MONOS PCT: 4 % (ref 3–12)
NEUTROS ABS: 4.2 10*3/uL (ref 1.7–7.7)
Neutrophils Relative %: 75 % (ref 43–77)
Platelets: 260 10*3/uL (ref 150–400)
RBC: 4.43 MIL/uL (ref 3.87–5.11)
RDW: 16.6 % — ABNORMAL HIGH (ref 11.5–15.5)
WBC: 5.6 10*3/uL (ref 4.0–10.5)

## 2014-07-04 LAB — FERRITIN: Ferritin: 28 ng/mL (ref 10–291)

## 2014-07-08 NOTE — Progress Notes (Signed)
Quick Note:  Hgb and ferritin slightly improved. Hgb still slightly below normal. Ferritin low normal. Continue ferrous sulfate daily. Repeat Hgb, ferritin in 3 months. OV with RMR in 07/2014 as planned. Very important to keep OV. ______

## 2014-07-09 ENCOUNTER — Encounter: Payer: Self-pay | Admitting: Internal Medicine

## 2014-07-24 ENCOUNTER — Other Ambulatory Visit: Payer: Self-pay | Admitting: Gastroenterology

## 2014-07-24 DIAGNOSIS — D509 Iron deficiency anemia, unspecified: Secondary | ICD-10-CM

## 2014-08-13 ENCOUNTER — Ambulatory Visit: Payer: Medicare Other | Admitting: Internal Medicine

## 2014-08-23 ENCOUNTER — Ambulatory Visit (INDEPENDENT_AMBULATORY_CARE_PROVIDER_SITE_OTHER): Payer: Medicare Other | Admitting: Internal Medicine

## 2014-08-23 ENCOUNTER — Encounter: Payer: Self-pay | Admitting: Internal Medicine

## 2014-08-23 VITALS — BP 147/68 | HR 75 | Temp 98.0°F | Ht 65.0 in | Wt 185.8 lb

## 2014-08-23 DIAGNOSIS — D509 Iron deficiency anemia, unspecified: Secondary | ICD-10-CM | POA: Diagnosis not present

## 2014-08-23 DIAGNOSIS — K219 Gastro-esophageal reflux disease without esophagitis: Secondary | ICD-10-CM | POA: Diagnosis not present

## 2014-08-23 NOTE — Progress Notes (Signed)
Primary Care Physician:  Manon Hilding, MD Primary Gastroenterologist:  Dr. Gala Romney  Pre-Procedure History & Physical: HPI:  Jenna [name] is a 69 y.o. female here for followup of iron deficiency anemia and Hemoccult-positive stool. Long history of iron deficiency anemia felt to be secondary intermittently bleeding small bowel AVMs. Well-documented small bowel AVMs on EGD and capsule study;  Proximal duodenal AVMs ablated via EGD previously. Small adenoma removed from her colon in 2014. Stools chronically dark; no gross blood per rectum. No abdominal pain no weight loss; reflux symptoms well controlled on Dexilant 60 mg daily.  Still has intermittent watery nonbloody diarrhea which is attributed to Glucophage - she continues to take. Weight is down 7 pounds since her last office visit.  She was occult blood positive on stool testing in June of last year. Hemoglobin and hematocrit 11.0 and 34.7 with an MCV of 83 last July.  Past Medical History  Diagnosis Date  . Hypertension   . GERD (gastroesophageal reflux disease)   . Iron deficiency anemia   . DM (diabetes mellitus)     Past Surgical History  Procedure Laterality Date  . Tubal ligation    . Colonoscopy  10/24/2009    normal rectum/1X1cm abnormal lesion in the ascending colon (bx benign). TI normal for 10cm.  Prep difficult/inadequate. f/u TCS 09/2012 recommended  . Givens capsule study  07/27/2010     multiple arteriovenous malformations which could definitely be the contributor to her drifting hemoglobin and hematocrit  . Esophagogastroduodenoscopy  10/13/2004     Normal esophagus/ Nodular volcano like lesion in the antrum, either representing a  pancreatic rest or leiomyoma, biopsied.  Remainder of the gastric mucosa appeared normal, normal D1-D2  . Colonoscopy  10/13/2004    Normal rectum/Diminutive polyps, splenic flexure, cold biopsied/removed.  Remainder of colonic mucosa appeared normal.  . Esophagogastroduodenoscopy   10/24/2009    Benign biopsies. normal esophagus/small hiatal hernia/nodular lesion antrum/distal greater curvature. duodenal AVM s/p ablation  . Colonoscopy N/A 12/14/2012    [id number]:[id number] polyp-tubular adenoma    Prior to Admission medications   Medication Sig Start Date End Date Taking? Authorizing Provider  dexlansoprazole (DEXILANT) 60 MG capsule 1 BY MOUTH 30 MIN BEFORE BREAKFAST DAILY 03/27/13  Yes Mahala Menghini, PA-C  ferrous sulfate 325 (65 FE) MG tablet Take 325 mg by mouth 3 (three) times daily with meals.   Yes Historical Provider, MD  glipiZIDE (GLUCOTROL) 5 MG tablet Take 5 mg by mouth 2 (two) times daily before a meal.     Yes Historical Provider, MD  losartan (COZAAR) 100 MG tablet Take 100 mg by mouth daily.     Yes Historical Provider, MD  metFORMIN (GLUCOPHAGE) 500 MG tablet Take 500 mg by mouth 2 (two) times daily with a meal.   Yes Historical Provider, MD  Multiple Vitamin (MULTIVITAMIN WITH MINERALS) TABS tablet Take 1 tablet by mouth daily.   Yes Historical Provider, MD  simvastatin (ZOCOR) 20 MG tablet Take 20 mg by mouth every evening.   Yes Historical Provider, MD    Allergies as of 08/23/2014 - Review Complete 08/23/2014  Allergen Reaction Noted  . Aspirin    . Esomeprazole magnesium      Family History  Problem Relation Age of Onset  . Colon cancer Maternal Aunt     greater than age 66  . Breast cancer Cousin     History   Social History  . Marital Status: Single    Spouse Name: N/A  Number of Children: N/A  . Years of Education: N/A   Occupational History  . Not on file.   Social History Main Topics  . Smoking status: Current Every Day Smoker -- 0.50 packs/day    Types: Cigarettes  . Smokeless tobacco: Not on file  . Alcohol Use: No  . Drug Use: No  . Sexual Activity: Not on file   Other Topics Concern  . Not on file   Social History Narrative    Review of Systems: See HPI, otherwise negative ROS  Physical Exam: BP 147/68 mmHg   Pulse 75  Temp(Src) 98 F (36.7 C)  Ht 5\' 5"  (1.651 m)  Wt 185 lb 12.8 oz (84.278 kg)  BMI 30.92 kg/m2 General:   Alert,  Well-developed, well-nourished, pleasant and cooperative in NAD Skin:  Intact without significant lesions or rashes. Eyes:  Sclera clear, no icterus.   Conjunctiva pink. Ears:  Normal auditory acuity. Nose:  No deformity, discharge,  or lesions. Mouth:  No deformity or lesions. Neck:  Supple; no masses or thyromegaly. No significant cervical adenopathy. Lungs:  Clear throughout to auscultation.   No wheezes, crackles, or rhonchi. No acute distress. Heart:  Regular rate and rhythm; no murmurs, clicks, rubs,  or gallops. Abdomen: Non-distended, normal bowel sounds.  Soft and nontender without appreciable mass or hepatosplenomegaly.  Pulses:  Normal pulses noted. Extremities:  Without clubbing or edema.  Impression: Pleasant 69 year old lady long-standing iron deficiency anemia felt to be secondary to intermittent bleeding AVMs. On chronic iron suppression therapy. Ferritin borderline low and mildly anemic last summer. Feels well these days. GERD well-controlled on Dexilant. Intermittent chronic diarrhea most likely related to Glucophage.   Recommendations:    Continue over the counter iron pills - one twice daily (reportedly 325 mg ferrous sulfate per tablet)  Serum ferritin and CBC today  Continue Dexilant 60 mg daily   Notice: This dictation was prepared with Dragon dictation along with smaller phrase technology. Any transcriptional errors that result from this process are unintentional and may not be corrected upon review.

## 2014-08-23 NOTE — Patient Instructions (Signed)
Continue over the counter iron pills - one twice daily  Serum ferritin and CBC today  Continue Dexilant 60 mg daily

## 2014-08-26 DIAGNOSIS — E780 Pure hypercholesterolemia: Secondary | ICD-10-CM | POA: Diagnosis not present

## 2014-08-26 DIAGNOSIS — Z720 Tobacco use: Secondary | ICD-10-CM | POA: Diagnosis not present

## 2014-08-26 DIAGNOSIS — I10 Essential (primary) hypertension: Secondary | ICD-10-CM | POA: Diagnosis not present

## 2014-08-26 DIAGNOSIS — K210 Gastro-esophageal reflux disease with esophagitis: Secondary | ICD-10-CM | POA: Diagnosis not present

## 2014-08-26 DIAGNOSIS — E119 Type 2 diabetes mellitus without complications: Secondary | ICD-10-CM | POA: Diagnosis not present

## 2014-09-02 DIAGNOSIS — E6609 Other obesity due to excess calories: Secondary | ICD-10-CM | POA: Diagnosis not present

## 2014-09-02 DIAGNOSIS — Z1389 Encounter for screening for other disorder: Secondary | ICD-10-CM | POA: Diagnosis not present

## 2014-09-02 DIAGNOSIS — E782 Mixed hyperlipidemia: Secondary | ICD-10-CM | POA: Diagnosis not present

## 2014-09-02 DIAGNOSIS — E1165 Type 2 diabetes mellitus with hyperglycemia: Secondary | ICD-10-CM | POA: Diagnosis not present

## 2014-09-02 DIAGNOSIS — I10 Essential (primary) hypertension: Secondary | ICD-10-CM | POA: Diagnosis not present

## 2014-09-02 DIAGNOSIS — Z720 Tobacco use: Secondary | ICD-10-CM | POA: Diagnosis not present

## 2014-09-11 ENCOUNTER — Other Ambulatory Visit: Payer: Self-pay

## 2014-09-11 DIAGNOSIS — D509 Iron deficiency anemia, unspecified: Secondary | ICD-10-CM

## 2014-10-15 DIAGNOSIS — D509 Iron deficiency anemia, unspecified: Secondary | ICD-10-CM | POA: Diagnosis not present

## 2014-10-16 LAB — HEMOGLOBIN: Hemoglobin: 11.7 g/dL — ABNORMAL LOW (ref 12.0–15.0)

## 2014-10-16 LAB — FERRITIN: Ferritin: 35 ng/mL (ref 10–291)

## 2014-10-21 NOTE — Progress Notes (Signed)
Quick Note:  Labs stable. Continue iron supplement. Recheck Hgb, ferritin in 4 months. ______

## 2014-10-28 DIAGNOSIS — H25011 Cortical age-related cataract, right eye: Secondary | ICD-10-CM | POA: Diagnosis not present

## 2014-10-28 DIAGNOSIS — E119 Type 2 diabetes mellitus without complications: Secondary | ICD-10-CM | POA: Diagnosis not present

## 2014-10-28 DIAGNOSIS — H35033 Hypertensive retinopathy, bilateral: Secondary | ICD-10-CM | POA: Diagnosis not present

## 2014-10-28 DIAGNOSIS — H40013 Open angle with borderline findings, low risk, bilateral: Secondary | ICD-10-CM | POA: Diagnosis not present

## 2014-10-28 DIAGNOSIS — H2511 Age-related nuclear cataract, right eye: Secondary | ICD-10-CM | POA: Diagnosis not present

## 2014-11-05 ENCOUNTER — Other Ambulatory Visit: Payer: Self-pay | Admitting: Gastroenterology

## 2014-11-05 DIAGNOSIS — D509 Iron deficiency anemia, unspecified: Secondary | ICD-10-CM

## 2014-11-26 DIAGNOSIS — H2511 Age-related nuclear cataract, right eye: Secondary | ICD-10-CM | POA: Diagnosis not present

## 2014-12-10 DIAGNOSIS — I10 Essential (primary) hypertension: Secondary | ICD-10-CM | POA: Diagnosis not present

## 2014-12-10 DIAGNOSIS — K210 Gastro-esophageal reflux disease with esophagitis: Secondary | ICD-10-CM | POA: Diagnosis not present

## 2014-12-10 DIAGNOSIS — E782 Mixed hyperlipidemia: Secondary | ICD-10-CM | POA: Diagnosis not present

## 2014-12-10 DIAGNOSIS — E1165 Type 2 diabetes mellitus with hyperglycemia: Secondary | ICD-10-CM | POA: Diagnosis not present

## 2014-12-17 DIAGNOSIS — I10 Essential (primary) hypertension: Secondary | ICD-10-CM | POA: Diagnosis not present

## 2014-12-17 DIAGNOSIS — Z720 Tobacco use: Secondary | ICD-10-CM | POA: Diagnosis not present

## 2014-12-17 DIAGNOSIS — E6609 Other obesity due to excess calories: Secondary | ICD-10-CM | POA: Diagnosis not present

## 2014-12-17 DIAGNOSIS — E1165 Type 2 diabetes mellitus with hyperglycemia: Secondary | ICD-10-CM | POA: Diagnosis not present

## 2014-12-17 DIAGNOSIS — E782 Mixed hyperlipidemia: Secondary | ICD-10-CM | POA: Diagnosis not present

## 2014-12-19 DIAGNOSIS — E1165 Type 2 diabetes mellitus with hyperglycemia: Secondary | ICD-10-CM | POA: Diagnosis not present

## 2015-01-09 DIAGNOSIS — J209 Acute bronchitis, unspecified: Secondary | ICD-10-CM | POA: Diagnosis not present

## 2015-01-19 DIAGNOSIS — E1165 Type 2 diabetes mellitus with hyperglycemia: Secondary | ICD-10-CM | POA: Diagnosis not present

## 2015-01-31 ENCOUNTER — Other Ambulatory Visit: Payer: Self-pay

## 2015-01-31 DIAGNOSIS — D509 Iron deficiency anemia, unspecified: Secondary | ICD-10-CM

## 2015-02-18 DIAGNOSIS — I10 Essential (primary) hypertension: Secondary | ICD-10-CM | POA: Diagnosis not present

## 2015-02-18 DIAGNOSIS — D509 Iron deficiency anemia, unspecified: Secondary | ICD-10-CM | POA: Diagnosis not present

## 2015-02-19 LAB — FERRITIN: FERRITIN: 26 ng/mL (ref 10–291)

## 2015-02-19 LAB — HEMOGLOBIN: Hemoglobin: 10.9 g/dL — ABNORMAL LOW (ref 12.0–15.0)

## 2015-02-23 NOTE — Progress Notes (Signed)
Quick Note:  Remains fairly stable.  Continue iron. Repeat Hgb, ferritin in 3 months with OV to follow. ______

## 2015-02-26 ENCOUNTER — Other Ambulatory Visit: Payer: Self-pay | Admitting: Gastroenterology

## 2015-02-26 DIAGNOSIS — D509 Iron deficiency anemia, unspecified: Secondary | ICD-10-CM

## 2015-03-21 DIAGNOSIS — I10 Essential (primary) hypertension: Secondary | ICD-10-CM | POA: Diagnosis not present

## 2015-04-15 DIAGNOSIS — E782 Mixed hyperlipidemia: Secondary | ICD-10-CM | POA: Diagnosis not present

## 2015-04-15 DIAGNOSIS — K210 Gastro-esophageal reflux disease with esophagitis: Secondary | ICD-10-CM | POA: Diagnosis not present

## 2015-04-15 DIAGNOSIS — E1165 Type 2 diabetes mellitus with hyperglycemia: Secondary | ICD-10-CM | POA: Diagnosis not present

## 2015-04-15 DIAGNOSIS — I10 Essential (primary) hypertension: Secondary | ICD-10-CM | POA: Diagnosis not present

## 2015-04-21 DIAGNOSIS — E119 Type 2 diabetes mellitus without complications: Secondary | ICD-10-CM | POA: Diagnosis not present

## 2015-04-23 DIAGNOSIS — E1165 Type 2 diabetes mellitus with hyperglycemia: Secondary | ICD-10-CM | POA: Diagnosis not present

## 2015-04-23 DIAGNOSIS — E782 Mixed hyperlipidemia: Secondary | ICD-10-CM | POA: Diagnosis not present

## 2015-04-23 DIAGNOSIS — Z23 Encounter for immunization: Secondary | ICD-10-CM | POA: Diagnosis not present

## 2015-04-23 DIAGNOSIS — Z720 Tobacco use: Secondary | ICD-10-CM | POA: Diagnosis not present

## 2015-04-23 DIAGNOSIS — I10 Essential (primary) hypertension: Secondary | ICD-10-CM | POA: Diagnosis not present

## 2015-04-23 DIAGNOSIS — K210 Gastro-esophageal reflux disease with esophagitis: Secondary | ICD-10-CM | POA: Diagnosis not present

## 2015-04-24 ENCOUNTER — Other Ambulatory Visit: Payer: Self-pay

## 2015-04-24 DIAGNOSIS — D509 Iron deficiency anemia, unspecified: Secondary | ICD-10-CM

## 2015-05-06 ENCOUNTER — Encounter: Payer: Self-pay | Admitting: Internal Medicine

## 2015-05-06 ENCOUNTER — Telehealth: Payer: Self-pay | Admitting: Internal Medicine

## 2015-05-06 NOTE — Telephone Encounter (Signed)
REPEAT HGB, FERRITIN IN 3 MONTHS WITH OV TO FOLLOW

## 2015-05-06 NOTE — Telephone Encounter (Signed)
Placed in lab box

## 2015-05-21 DIAGNOSIS — I10 Essential (primary) hypertension: Secondary | ICD-10-CM | POA: Diagnosis not present

## 2015-05-22 DIAGNOSIS — D509 Iron deficiency anemia, unspecified: Secondary | ICD-10-CM | POA: Diagnosis not present

## 2015-05-23 LAB — FERRITIN: Ferritin: 34 ng/mL (ref 10–291)

## 2015-05-23 LAB — HEMOGLOBIN: HEMOGLOBIN: 11.6 g/dL — AB (ref 12.0–15.0)

## 2015-05-27 ENCOUNTER — Other Ambulatory Visit: Payer: Self-pay | Admitting: Gastroenterology

## 2015-05-27 DIAGNOSIS — D509 Iron deficiency anemia, unspecified: Secondary | ICD-10-CM

## 2015-05-27 NOTE — Progress Notes (Signed)
Quick Note:  Stable labs, continue iron. Hgb, ferritin 6 months. ______

## 2015-06-03 DIAGNOSIS — Z6828 Body mass index (BMI) 28.0-28.9, adult: Secondary | ICD-10-CM | POA: Diagnosis not present

## 2015-06-03 DIAGNOSIS — Z01419 Encounter for gynecological examination (general) (routine) without abnormal findings: Secondary | ICD-10-CM | POA: Diagnosis not present

## 2015-06-09 DIAGNOSIS — Z1231 Encounter for screening mammogram for malignant neoplasm of breast: Secondary | ICD-10-CM | POA: Diagnosis not present

## 2015-06-21 DIAGNOSIS — K219 Gastro-esophageal reflux disease without esophagitis: Secondary | ICD-10-CM | POA: Diagnosis not present

## 2015-06-21 DIAGNOSIS — I10 Essential (primary) hypertension: Secondary | ICD-10-CM | POA: Diagnosis not present

## 2015-06-30 ENCOUNTER — Other Ambulatory Visit: Payer: Self-pay

## 2015-06-30 DIAGNOSIS — D509 Iron deficiency anemia, unspecified: Secondary | ICD-10-CM

## 2015-07-21 DIAGNOSIS — I10 Essential (primary) hypertension: Secondary | ICD-10-CM | POA: Diagnosis not present

## 2015-07-30 DIAGNOSIS — D509 Iron deficiency anemia, unspecified: Secondary | ICD-10-CM | POA: Diagnosis not present

## 2015-07-31 LAB — FERRITIN: Ferritin: 27 ng/mL (ref 10–291)

## 2015-07-31 LAB — HEMOGLOBIN: HEMOGLOBIN: 10.6 g/dL — AB (ref 12.0–15.0)

## 2015-08-06 NOTE — Progress Notes (Signed)
Quick Note:  Very slight drop in Hgb and ferritin. Continue iron supplements.  CBC, ferritin in four months. OV with RMR only in four months. ______

## 2015-08-07 ENCOUNTER — Other Ambulatory Visit: Payer: Self-pay | Admitting: Gastroenterology

## 2015-08-07 DIAGNOSIS — D509 Iron deficiency anemia, unspecified: Secondary | ICD-10-CM

## 2015-08-19 DIAGNOSIS — K210 Gastro-esophageal reflux disease with esophagitis: Secondary | ICD-10-CM | POA: Diagnosis not present

## 2015-08-19 DIAGNOSIS — E1165 Type 2 diabetes mellitus with hyperglycemia: Secondary | ICD-10-CM | POA: Diagnosis not present

## 2015-08-19 DIAGNOSIS — E782 Mixed hyperlipidemia: Secondary | ICD-10-CM | POA: Diagnosis not present

## 2015-08-19 DIAGNOSIS — I10 Essential (primary) hypertension: Secondary | ICD-10-CM | POA: Diagnosis not present

## 2015-09-05 DIAGNOSIS — Z720 Tobacco use: Secondary | ICD-10-CM | POA: Diagnosis not present

## 2015-09-05 DIAGNOSIS — I10 Essential (primary) hypertension: Secondary | ICD-10-CM | POA: Diagnosis not present

## 2015-09-05 DIAGNOSIS — Z1389 Encounter for screening for other disorder: Secondary | ICD-10-CM | POA: Diagnosis not present

## 2015-09-05 DIAGNOSIS — E782 Mixed hyperlipidemia: Secondary | ICD-10-CM | POA: Diagnosis not present

## 2015-09-05 DIAGNOSIS — E1165 Type 2 diabetes mellitus with hyperglycemia: Secondary | ICD-10-CM | POA: Diagnosis not present

## 2015-09-05 DIAGNOSIS — K210 Gastro-esophageal reflux disease with esophagitis: Secondary | ICD-10-CM | POA: Diagnosis not present

## 2015-09-30 DIAGNOSIS — E11319 Type 2 diabetes mellitus with unspecified diabetic retinopathy without macular edema: Secondary | ICD-10-CM | POA: Diagnosis not present

## 2015-11-11 ENCOUNTER — Encounter: Payer: Self-pay | Admitting: Internal Medicine

## 2015-11-14 ENCOUNTER — Other Ambulatory Visit: Payer: Self-pay

## 2015-11-14 DIAGNOSIS — D509 Iron deficiency anemia, unspecified: Secondary | ICD-10-CM

## 2015-11-26 DIAGNOSIS — D509 Iron deficiency anemia, unspecified: Secondary | ICD-10-CM | POA: Diagnosis not present

## 2015-11-26 LAB — CBC WITH DIFFERENTIAL/PLATELET
BASOS PCT: 0 %
Basophils Absolute: 0 cells/uL (ref 0–200)
EOS ABS: 45 {cells}/uL (ref 15–500)
Eosinophils Relative: 1 %
HEMATOCRIT: 37.7 % (ref 35.0–45.0)
Hemoglobin: 11.8 g/dL (ref 11.7–15.5)
LYMPHS PCT: 21 %
Lymphs Abs: 945 cells/uL (ref 850–3900)
MCH: 27.1 pg (ref 27.0–33.0)
MCHC: 31.3 g/dL — ABNORMAL LOW (ref 32.0–36.0)
MCV: 86.7 fL (ref 80.0–100.0)
MONO ABS: 180 {cells}/uL — AB (ref 200–950)
MPV: 10.8 fL (ref 7.5–12.5)
Monocytes Relative: 4 %
NEUTROS PCT: 74 %
Neutro Abs: 3330 cells/uL (ref 1500–7800)
Platelets: 221 10*3/uL (ref 140–400)
RBC: 4.35 MIL/uL (ref 3.80–5.10)
RDW: 15 % (ref 11.0–15.0)
WBC: 4.5 10*3/uL (ref 3.8–10.8)

## 2015-11-27 LAB — FERRITIN: Ferritin: 33 ng/mL (ref 20–288)

## 2015-12-03 NOTE — Progress Notes (Signed)
Quick Note:  Labs look good. Keep OV with RMR this week as planned. ______

## 2015-12-09 ENCOUNTER — Ambulatory Visit (INDEPENDENT_AMBULATORY_CARE_PROVIDER_SITE_OTHER): Payer: Medicare Other | Admitting: Internal Medicine

## 2015-12-09 ENCOUNTER — Encounter: Payer: Self-pay | Admitting: Internal Medicine

## 2015-12-09 VITALS — BP 143/67 | HR 89 | Temp 97.7°F | Ht 65.0 in | Wt 177.4 lb

## 2015-12-09 DIAGNOSIS — K219 Gastro-esophageal reflux disease without esophagitis: Secondary | ICD-10-CM

## 2015-12-09 DIAGNOSIS — D509 Iron deficiency anemia, unspecified: Secondary | ICD-10-CM

## 2015-12-09 DIAGNOSIS — Z86010 Personal history of colonic polyps: Secondary | ICD-10-CM | POA: Diagnosis not present

## 2015-12-09 NOTE — Patient Instructions (Addendum)
Continue Dexilant 60 mg daily  Continue Iron daily  Office visit with CBC, ferritin in 1 year  Repeat colonoscopy 2021

## 2015-12-09 NOTE — Progress Notes (Signed)
Primary Care Physician:  Manon Hilding, MD Primary Gastroenterologist:  Dr. Gala Romney  Pre-Procedure History & Physical: HPI:  Jenna [name] is a 70 y.o. female here for follow-up of iron deficiency anemia and GERD. Recent labs much better. H&H 11.8 and 37.7. MCV 86.7 has not had any melena rectal bleeding;  feels well; she's lost 8 pounds;  to become more healthy. Reflux symptoms well controlled on Dexilant 60 mg daily.: OTC iron supplement. History of small colonic adenoma removed 2014; slated for surveillance examination 2021.  Past Medical History  Diagnosis Date  . Hypertension   . GERD (gastroesophageal reflux disease)   . Iron deficiency anemia   . DM (diabetes mellitus) ([place])     Past Surgical History  Procedure Laterality Date  . Tubal ligation    . Colonoscopy  10/24/2009    normal rectum/1X1cm abnormal lesion in the ascending colon (bx benign). TI normal for 10cm.  Prep difficult/inadequate. f/u TCS 09/2012 recommended  . [place] capsule study  07/27/2010     multiple arteriovenous malformations which could definitely be the contributor to her drifting hemoglobin and hematocrit  . Esophagogastroduodenoscopy  10/13/2004     Normal esophagus/ Nodular volcano like lesion in the antrum, either representing a  pancreatic rest or leiomyoma, biopsied.  Remainder of the gastric mucosa appeared normal, normal D1-D2  . Colonoscopy  10/13/2004    Normal rectum/Diminutive polyps, splenic flexure, cold biopsied/removed.  Remainder of colonic mucosa appeared normal.  . Esophagogastroduodenoscopy  10/24/2009    Benign biopsies. normal esophagus/small hiatal hernia/nodular lesion antrum/distal greater curvature. duodenal AVM s/p ablation  . Colonoscopy N/A 12/14/2012    [id number] polyp-tubular adenoma    Prior to Admission medications   Medication Sig Start Date End Date Taking? Authorizing Provider  dexlansoprazole (DEXILANT) 60 MG capsule 1 BY MOUTH 30 MIN BEFORE BREAKFAST DAILY  03/27/13  Yes Mahala Menghini, PA-C  ferrous sulfate 325 (65 FE) MG tablet Take 325 mg by mouth 3 (three) times daily with meals.   Yes Historical Provider, MD  glipiZIDE (GLUCOTROL) 5 MG tablet Take 5 mg by mouth 2 (two) times daily before a meal. ER 10 mg   Yes Historical Provider, MD  losartan (COZAAR) 100 MG tablet Take 100 mg by mouth daily.     Yes Historical Provider, MD  Multiple Vitamin (MULTIVITAMIN WITH MINERALS) TABS tablet Take 1 tablet by mouth daily.   Yes Historical Provider, MD  simvastatin (ZOCOR) 20 MG tablet Take 20 mg by mouth every evening.   Yes Historical Provider, MD  metFORMIN (GLUCOPHAGE) 500 MG tablet Take 500 mg by mouth 2 (two) times daily with a meal. Reported on 12/09/2015    Historical Provider, MD    Allergies as of 12/09/2015 - Review Complete 12/09/2015  Allergen Reaction Noted  . Aspirin    . Esomeprazole magnesium      Family History  Problem Relation Age of Onset  . Colon cancer Maternal Aunt     greater than age 107  . Breast cancer Cousin     Social History   Social History  . Marital Status: Single    Spouse Name: N/A  . Number of Children: N/A  . Years of Education: N/A   Occupational History  . Not on file.   Social History Main Topics  . Smoking status: Current Every Day Smoker -- 0.50 packs/day    Types: Cigarettes  . Smokeless tobacco: Not on file  . Alcohol Use: No  . Drug Use: No  .  Sexual Activity: Not on file   Other Topics Concern  . Not on file   Social History Narrative    Review of Systems: See HPI, otherwise negative ROS  Physical Exam: BP 143/67 mmHg  Pulse 89  Temp(Src) 97.7 F (36.5 C)  Ht '5\' 5"'$  (1.651 m)  Wt 177 lb 6.4 oz (80.468 kg)  BMI 29.52 kg/m2 General:   Alert,  Well-developed, well-nourished, pleasant and cooperative in NAD Skin:  Intact without significant lesions or rashes. Eyes:  Sclera clear, no icterus.   Conjunctiva pink. Lungs:  Clear.  Regular rate and rhythm without murmur gallop rub  : Abdomen: Non-distended positive bowel sounds soft non-tender without mass or organomegaly  Impression:  Pleasant 70 year old lady with a history of iron deficiency anemia likely, in part, secondary to intermittently bleeding small bowel AVMs. Iron parameters, hemoglobin now within the normal range on over-the-counter iron supplementation. GERD symptoms well controlled on Dexilant. History of colonic adenoma; due for surveillance examination 2021.   Recommendations:Continue Dexilant 60 mg daily  Continue Iron daily  Office visit with CBC, ferritin in 1 year  Repeat colonoscopy 2021   Notice: This dictation was prepared with Dragon dictation along with smaller phrase technology. Any transcriptional errors that result from this process are unintentional and may not be corrected upon review.

## 2016-01-02 DIAGNOSIS — E782 Mixed hyperlipidemia: Secondary | ICD-10-CM | POA: Diagnosis not present

## 2016-01-02 DIAGNOSIS — K210 Gastro-esophageal reflux disease with esophagitis: Secondary | ICD-10-CM | POA: Diagnosis not present

## 2016-01-02 DIAGNOSIS — E1165 Type 2 diabetes mellitus with hyperglycemia: Secondary | ICD-10-CM | POA: Diagnosis not present

## 2016-01-02 DIAGNOSIS — Z1159 Encounter for screening for other viral diseases: Secondary | ICD-10-CM | POA: Diagnosis not present

## 2016-01-02 DIAGNOSIS — I10 Essential (primary) hypertension: Secondary | ICD-10-CM | POA: Diagnosis not present

## 2016-01-06 DIAGNOSIS — E1165 Type 2 diabetes mellitus with hyperglycemia: Secondary | ICD-10-CM | POA: Diagnosis not present

## 2016-01-06 DIAGNOSIS — Z720 Tobacco use: Secondary | ICD-10-CM | POA: Diagnosis not present

## 2016-01-06 DIAGNOSIS — E782 Mixed hyperlipidemia: Secondary | ICD-10-CM | POA: Diagnosis not present

## 2016-05-06 DIAGNOSIS — K210 Gastro-esophageal reflux disease with esophagitis: Secondary | ICD-10-CM | POA: Diagnosis not present

## 2016-05-06 DIAGNOSIS — E1165 Type 2 diabetes mellitus with hyperglycemia: Secondary | ICD-10-CM | POA: Diagnosis not present

## 2016-05-06 DIAGNOSIS — E782 Mixed hyperlipidemia: Secondary | ICD-10-CM | POA: Diagnosis not present

## 2016-05-06 DIAGNOSIS — I10 Essential (primary) hypertension: Secondary | ICD-10-CM | POA: Diagnosis not present

## 2016-05-11 DIAGNOSIS — Z720 Tobacco use: Secondary | ICD-10-CM | POA: Diagnosis not present

## 2016-05-11 DIAGNOSIS — E782 Mixed hyperlipidemia: Secondary | ICD-10-CM | POA: Diagnosis not present

## 2016-05-11 DIAGNOSIS — E1165 Type 2 diabetes mellitus with hyperglycemia: Secondary | ICD-10-CM | POA: Diagnosis not present

## 2016-05-11 DIAGNOSIS — Z23 Encounter for immunization: Secondary | ICD-10-CM | POA: Diagnosis not present

## 2016-09-09 DIAGNOSIS — E1165 Type 2 diabetes mellitus with hyperglycemia: Secondary | ICD-10-CM | POA: Diagnosis not present

## 2016-09-09 DIAGNOSIS — E782 Mixed hyperlipidemia: Secondary | ICD-10-CM | POA: Diagnosis not present

## 2016-09-09 DIAGNOSIS — E7800 Pure hypercholesterolemia, unspecified: Secondary | ICD-10-CM | POA: Diagnosis not present

## 2016-09-09 DIAGNOSIS — Z720 Tobacco use: Secondary | ICD-10-CM | POA: Diagnosis not present

## 2016-09-13 DIAGNOSIS — Z720 Tobacco use: Secondary | ICD-10-CM | POA: Diagnosis not present

## 2016-09-13 DIAGNOSIS — E782 Mixed hyperlipidemia: Secondary | ICD-10-CM | POA: Diagnosis not present

## 2016-09-13 DIAGNOSIS — E1165 Type 2 diabetes mellitus with hyperglycemia: Secondary | ICD-10-CM | POA: Diagnosis not present

## 2016-10-20 ENCOUNTER — Encounter: Payer: Self-pay | Admitting: Internal Medicine

## 2016-12-13 DIAGNOSIS — L02211 Cutaneous abscess of abdominal wall: Secondary | ICD-10-CM | POA: Diagnosis not present

## 2016-12-24 ENCOUNTER — Encounter: Payer: Self-pay | Admitting: Internal Medicine

## 2016-12-24 ENCOUNTER — Ambulatory Visit (INDEPENDENT_AMBULATORY_CARE_PROVIDER_SITE_OTHER): Payer: Medicare Other | Admitting: Internal Medicine

## 2016-12-24 VITALS — BP 134/50 | HR 74 | Temp 98.0°F | Ht 65.0 in | Wt 179.4 lb

## 2016-12-24 DIAGNOSIS — D509 Iron deficiency anemia, unspecified: Secondary | ICD-10-CM

## 2016-12-24 NOTE — Patient Instructions (Signed)
Continue Dexilant 60 mg daily  GERD information provided  Repeat colonoscopy 2021  CBC today  Office visit in 1 year

## 2016-12-24 NOTE — Progress Notes (Signed)
Primary Care Physician:  Manon Hilding, MD Primary Gastroenterologist:  Dr. Gala Romney  Pre-Procedure History & Physical: HPI:  Jenna [name] is a 71 y.o. female here for one-year follow-up of GERD. History of colonic adenoma. Due for surveillance examination 2021. Patient has a history of iron deficiency anemia. 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 Last CBC a year ago revealed a normal H&H. She has done very well; denies hematochezia or melena. Reflux symptoms well controlled. No dysphagia abdominal pain, etc.  Have an issue with periumbilical cellulitis versus small abscess as she described which was treated with antibiotics recently and has resolved.  She is not taking any NSAIDs and is not anticoagulated.  Past Medical History:  Diagnosis Date  . DM (diabetes mellitus) ([place])   . GERD (gastroesophageal reflux disease)   . Hypertension   . Iron deficiency anemia     Past Surgical History:  Procedure Laterality Date  . COLONOSCOPY  10/24/2009   normal rectum/1X1cm abnormal lesion in the ascending colon (bx benign). TI normal for 10cm.  Prep difficult/inadequate. f/u TCS 09/2012 recommended  . COLONOSCOPY  10/13/2004   Normal rectum/Diminutive polyps, splenic flexure, cold biopsied/removed.  Remainder of colonic mucosa appeared normal.  . COLONOSCOPY N/A 12/14/2012   DVV:[id number] polyp-tubular adenoma  . ESOPHAGOGASTRODUODENOSCOPY  10/13/2004    Normal esophagus/ Nodular volcano like lesion in the antrum, either representing a  pancreatic rest or leiomyoma, biopsied.  Remainder of the gastric mucosa appeared normal, normal D1-D2  . ESOPHAGOGASTRODUODENOSCOPY  10/24/2009   Benign biopsies. normal esophagus/small hiatal hernia/nodular lesion antrum/distal greater curvature. duodenal AVM s/p ablation  . GIVENS CAPSULE STUDY  07/27/2010    multiple arteriovenous malformations which could definitely be the contributor to her drifting hemoglobin and hematocrit  . TUBAL LIGATION        Prior to Admission medications   Medication Sig Start Date End Date Taking? Authorizing Provider  dexlansoprazole (DEXILANT) 60 MG capsule 1 BY MOUTH 30 MIN BEFORE BREAKFAST DAILY 03/27/13  Yes Mahala Menghini, PA-C  ferrous sulfate 325 (65 FE) MG tablet Take 325 mg by mouth 3 (three) times daily with meals. Hasn't been taking since started antibiotic on 12/14/16   Yes [provider]  glipiZIDE (GLUCOTROL) 5 MG tablet Take 5 mg by mouth 2 (two) times daily before a meal. ER 10 mg   Yes [provider]  losartan (COZAAR) 100 MG tablet Take 100 mg by mouth daily.     Yes [provider]  Multiple Vitamin (MULTIVITAMIN WITH MINERALS) TABS tablet Take 1 tablet by mouth daily.   Yes [provider]  simvastatin (ZOCOR) 20 MG tablet Take 20 mg by mouth every evening.   Yes [provider]  sulfamethoxazole-trimethoprim (BACTRIM DS,SEPTRA DS) 800-160 MG tablet Take 1 tablet by mouth 2 (two) times daily. 12/13/16  Yes [provider]  metFORMIN (GLUCOPHAGE) 500 MG tablet Take 500 mg by mouth 2 (two) times daily with a meal. Reported on 12/09/2015    [provider]    Allergies as of 12/24/2016 - Review Complete 12/24/2016  Allergen Reaction Noted  . Aspirin    . Esomeprazole magnesium      Family History  Problem Relation Age of Onset  . Colon cancer Maternal Aunt        greater than age 25  . Breast cancer Cousin     Social History   Social History  . Marital status: Single    Spouse name: N/A  .  Number of children: N/A  . Years of education: N/A   Occupational History  . Not on file.   Social History Main Topics  . Smoking status: Current Every Day Smoker    Packs/day: 0.50    Types: Cigarettes  . Smokeless tobacco: Never Used  . Alcohol use No  . Drug use: No  . Sexual activity: Not on file   Other Topics Concern  . Not on file   Social History Narrative  . No narrative on file    Review of Systems: See  HPI, otherwise negative ROS  Physical Exam: BP (!) 134/50   Pulse 74   Temp 98 F (36.7 C) (Oral)   Ht '5\' 5"'$  (1.651 m)   Wt 179 lb 6.4 oz (81.4 kg)   BMI 29.85 kg/m  General:   Alert,   pleasant and cooperative in NAD. She appears younger than stated chronological age. Neck:  Supple; no masses or thyromegaly. No significant cervical adenopathy. Lungs:  Clear throughout to auscultation.   No wheezes, crackles, or rhonchi. No acute distress. Heart:  Regular rate and rhythm; no murmurs, clicks, rubs,  or gallops. Abdomen: Non-distended, normal bowel sounds.  Soft and nontender without appreciable mass or hepatosplenomegaly. Umbilicus looks good Pulses:  Normal pulses noted. Extremities:  Without clubbing or edema.  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 Doing very well. GERD symptoms well controlled on Dexilant.   History of colonic adenoma.   Recommendations:  Continue Dexilant 60 mg daily  GERD information provided  Repeat colonoscopy 2021  CBC today  Office visit in 1 year          Notice: This dictation was prepared with Dragon dictation along with smaller phrase technology. Any transcriptional errors that result from this process are unintentional and may not be corrected upon review.

## 2017-01-12 DIAGNOSIS — E1165 Type 2 diabetes mellitus with hyperglycemia: Secondary | ICD-10-CM | POA: Diagnosis not present

## 2017-01-12 DIAGNOSIS — E7800 Pure hypercholesterolemia, unspecified: Secondary | ICD-10-CM | POA: Diagnosis not present

## 2017-01-12 DIAGNOSIS — E782 Mixed hyperlipidemia: Secondary | ICD-10-CM | POA: Diagnosis not present

## 2017-01-12 DIAGNOSIS — Z720 Tobacco use: Secondary | ICD-10-CM | POA: Diagnosis not present

## 2017-01-14 DIAGNOSIS — Z720 Tobacco use: Secondary | ICD-10-CM | POA: Diagnosis not present

## 2017-01-14 DIAGNOSIS — E782 Mixed hyperlipidemia: Secondary | ICD-10-CM | POA: Diagnosis not present

## 2017-01-14 DIAGNOSIS — E1165 Type 2 diabetes mellitus with hyperglycemia: Secondary | ICD-10-CM | POA: Diagnosis not present

## 2017-01-14 DIAGNOSIS — Z1389 Encounter for screening for other disorder: Secondary | ICD-10-CM | POA: Diagnosis not present

## 2017-01-14 DIAGNOSIS — Z0001 Encounter for general adult medical examination with abnormal findings: Secondary | ICD-10-CM | POA: Diagnosis not present

## 2017-01-25 DIAGNOSIS — Z1231 Encounter for screening mammogram for malignant neoplasm of breast: Secondary | ICD-10-CM | POA: Diagnosis not present

## 2017-01-25 DIAGNOSIS — D509 Iron deficiency anemia, unspecified: Secondary | ICD-10-CM | POA: Diagnosis not present

## 2017-02-01 ENCOUNTER — Telehealth: Payer: Self-pay

## 2017-02-01 NOTE — Telephone Encounter (Signed)
Received lab results from [facility] for this pt. Her hemoglobin is 9.4, hematocrit is 32.4. I called the pt, she said she had been off of her iron when she did the blood work but she is back on it now. She said she is feeling good and has no complaints at this time. She has not seen any blood in her stool. I have put the lab results on RMR cart.

## 2017-05-16 DIAGNOSIS — E7800 Pure hypercholesterolemia, unspecified: Secondary | ICD-10-CM | POA: Diagnosis not present

## 2017-05-16 DIAGNOSIS — E1165 Type 2 diabetes mellitus with hyperglycemia: Secondary | ICD-10-CM | POA: Diagnosis not present

## 2017-05-16 DIAGNOSIS — I10 Essential (primary) hypertension: Secondary | ICD-10-CM | POA: Diagnosis not present

## 2017-05-16 DIAGNOSIS — K210 Gastro-esophageal reflux disease with esophagitis: Secondary | ICD-10-CM | POA: Diagnosis not present

## 2017-05-16 DIAGNOSIS — E782 Mixed hyperlipidemia: Secondary | ICD-10-CM | POA: Diagnosis not present

## 2017-05-19 DIAGNOSIS — Z720 Tobacco use: Secondary | ICD-10-CM | POA: Diagnosis not present

## 2017-05-19 DIAGNOSIS — E782 Mixed hyperlipidemia: Secondary | ICD-10-CM | POA: Diagnosis not present

## 2017-05-19 DIAGNOSIS — Z23 Encounter for immunization: Secondary | ICD-10-CM | POA: Diagnosis not present

## 2017-05-19 DIAGNOSIS — E1165 Type 2 diabetes mellitus with hyperglycemia: Secondary | ICD-10-CM | POA: Diagnosis not present

## 2017-06-14 DIAGNOSIS — Z01419 Encounter for gynecological examination (general) (routine) without abnormal findings: Secondary | ICD-10-CM | POA: Diagnosis not present

## 2017-06-14 DIAGNOSIS — Z124 Encounter for screening for malignant neoplasm of cervix: Secondary | ICD-10-CM | POA: Diagnosis not present

## 2017-08-03 DIAGNOSIS — Z0189 Encounter for other specified special examinations: Secondary | ICD-10-CM | POA: Diagnosis not present

## 2017-08-03 DIAGNOSIS — Z79899 Other long term (current) drug therapy: Secondary | ICD-10-CM | POA: Diagnosis not present

## 2017-08-03 DIAGNOSIS — E119 Type 2 diabetes mellitus without complications: Secondary | ICD-10-CM | POA: Diagnosis not present

## 2017-08-03 DIAGNOSIS — I10 Essential (primary) hypertension: Secondary | ICD-10-CM | POA: Diagnosis not present

## 2017-08-03 DIAGNOSIS — E785 Hyperlipidemia, unspecified: Secondary | ICD-10-CM | POA: Diagnosis not present

## 2017-09-13 DIAGNOSIS — Z0000 Encounter for general adult medical examination without abnormal findings: Secondary | ICD-10-CM | POA: Diagnosis not present

## 2017-09-13 DIAGNOSIS — E119 Type 2 diabetes mellitus without complications: Secondary | ICD-10-CM | POA: Diagnosis not present

## 2017-09-13 DIAGNOSIS — E785 Hyperlipidemia, unspecified: Secondary | ICD-10-CM | POA: Diagnosis not present

## 2017-09-13 DIAGNOSIS — I10 Essential (primary) hypertension: Secondary | ICD-10-CM | POA: Diagnosis not present

## 2017-10-20 DIAGNOSIS — M7989 Other specified soft tissue disorders: Secondary | ICD-10-CM | POA: Diagnosis not present

## 2017-10-20 DIAGNOSIS — M2041 Other hammer toe(s) (acquired), right foot: Secondary | ICD-10-CM | POA: Diagnosis not present

## 2017-10-20 DIAGNOSIS — B351 Tinea unguium: Secondary | ICD-10-CM | POA: Diagnosis not present

## 2017-10-20 DIAGNOSIS — M2042 Other hammer toe(s) (acquired), left foot: Secondary | ICD-10-CM | POA: Diagnosis not present

## 2017-11-08 ENCOUNTER — Encounter: Payer: Self-pay | Admitting: Internal Medicine

## 2017-11-28 ENCOUNTER — Ambulatory Visit: Payer: Self-pay | Admitting: Nurse Practitioner

## 2017-12-13 DIAGNOSIS — I10 Essential (primary) hypertension: Secondary | ICD-10-CM | POA: Diagnosis not present

## 2017-12-13 DIAGNOSIS — E119 Type 2 diabetes mellitus without complications: Secondary | ICD-10-CM | POA: Diagnosis not present

## 2017-12-13 DIAGNOSIS — E1165 Type 2 diabetes mellitus with hyperglycemia: Secondary | ICD-10-CM | POA: Diagnosis not present

## 2017-12-13 DIAGNOSIS — J301 Allergic rhinitis due to pollen: Secondary | ICD-10-CM | POA: Diagnosis not present

## 2017-12-13 DIAGNOSIS — Z79899 Other long term (current) drug therapy: Secondary | ICD-10-CM | POA: Diagnosis not present

## 2017-12-13 DIAGNOSIS — E785 Hyperlipidemia, unspecified: Secondary | ICD-10-CM | POA: Diagnosis not present

## 2017-12-13 DIAGNOSIS — G4700 Insomnia, unspecified: Secondary | ICD-10-CM | POA: Diagnosis not present

## 2017-12-27 ENCOUNTER — Ambulatory Visit (INDEPENDENT_AMBULATORY_CARE_PROVIDER_SITE_OTHER): Payer: Medicare Other | Admitting: Internal Medicine

## 2017-12-27 ENCOUNTER — Encounter: Payer: Self-pay | Admitting: Internal Medicine

## 2017-12-27 ENCOUNTER — Other Ambulatory Visit: Payer: Self-pay | Admitting: Internal Medicine

## 2017-12-27 VITALS — BP 152/68 | HR 71 | Temp 97.3°F | Ht 65.0 in | Wt 173.6 lb

## 2017-12-27 DIAGNOSIS — Z86010 Personal history of colonic polyps: Secondary | ICD-10-CM | POA: Diagnosis not present

## 2017-12-27 DIAGNOSIS — D649 Anemia, unspecified: Secondary | ICD-10-CM | POA: Diagnosis not present

## 2017-12-27 DIAGNOSIS — D508 Other iron deficiency anemias: Secondary | ICD-10-CM | POA: Diagnosis not present

## 2017-12-27 DIAGNOSIS — K219 Gastro-esophageal reflux disease without esophagitis: Secondary | ICD-10-CM

## 2017-12-27 NOTE — Progress Notes (Signed)
Primary Care Physician:  Manon Hilding, MD Primary Gastroenterologist:  Dr. Gala Romney  Pre-Procedure History & Physical: HPI:  Jenna [name] is a 72 y.o. female here for follow-up of the IDA and GERD. GERD continues to be well-controlled on the Dexilant 60 mg daily. No dysphagia or other upper GI tract symptoms. History of IDA felt to be due to intermittently bleeding small bowel AVMs.; History of colonic adenomas-due for surveillance examination 2021. I don't see where this nice lady's had any recent blood work.  Past Medical History:  Diagnosis Date  . DM (diabetes mellitus) ([place])   . GERD (gastroesophageal reflux disease)   . Hypertension   . Iron deficiency anemia     Past Surgical History:  Procedure Laterality Date  . COLONOSCOPY  10/24/2009   normal rectum/1X1cm abnormal lesion in the ascending colon (bx benign). TI normal for 10cm.  Prep difficult/inadequate. f/u TCS 09/2012 recommended  . COLONOSCOPY  10/13/2004   Normal rectum/Diminutive polyps, splenic flexure, cold biopsied/removed.  Remainder of colonic mucosa appeared normal.  . COLONOSCOPY N/A 12/14/2012   [id number] polyp-tubular adenoma  . ESOPHAGOGASTRODUODENOSCOPY  10/13/2004    Normal esophagus/ Nodular volcano like lesion in the antrum, either representing a  pancreatic rest or leiomyoma, biopsied.  Remainder of the gastric mucosa appeared normal, normal D1-D2  . ESOPHAGOGASTRODUODENOSCOPY  10/24/2009   Benign biopsies. normal esophagus/small hiatal hernia/nodular lesion antrum/distal greater curvature. duodenal AVM s/p ablation  . GIVENS CAPSULE STUDY  07/27/2010    multiple arteriovenous malformations which could definitely be the contributor to her drifting hemoglobin and hematocrit  . TUBAL LIGATION      Prior to Admission medications   Medication Sig Start Date End Date Taking? Authorizing Provider  dexlansoprazole (DEXILANT) 60 MG capsule 1 BY MOUTH 30 MIN BEFORE BREAKFAST DAILY 03/27/13  Yes Mahala Menghini, PA-C  ferrous sulfate 325 (65 FE) MG tablet Take 325 mg by mouth 3 (three) times daily with meals. Hasn't been taking since started antibiotic on 12/14/16   Yes [provider]  glipiZIDE (GLUCOTROL) 5 MG tablet Take 5 mg by mouth 2 (two) times daily before a meal. ER 10 mg   Yes [provider]  losartan (COZAAR) 100 MG tablet Take 100 mg by mouth daily.     Yes [provider]  Multiple Vitamin (MULTIVITAMIN WITH MINERALS) TABS tablet Take 1 tablet by mouth daily.   Yes [provider]  simvastatin (ZOCOR) 20 MG tablet Take 20 mg by mouth every evening.   Yes [provider]  metFORMIN (GLUCOPHAGE) 500 MG tablet Take 500 mg by mouth 2 (two) times daily with a meal. Reported on 12/09/2015    [provider]  sulfamethoxazole-trimethoprim (BACTRIM DS,SEPTRA DS) 800-160 MG tablet Take 1 tablet by mouth 2 (two) times daily. 12/13/16   [provider]    Allergies as of 12/27/2017 - Review Complete 12/27/2017  Allergen Reaction Noted  . Aspirin    . Esomeprazole magnesium      Family History  Problem Relation Age of Onset  . Colon cancer Maternal Aunt        greater than age 59  . Breast cancer Cousin     Social History   Socioeconomic History  . Marital status: Single    Spouse name: Not on file  . Number of children: Not on file  . Years of education: Not on file  . Highest education level: Not on file  Occupational History  . Not on  file  Social Needs  . Financial resource strain: Not on file  . Food insecurity:    Worry: Not on file    Inability: Not on file  . Transportation needs:    Medical: Not on file    Non-medical: Not on file  Tobacco Use  . Smoking status: Current Every Day Smoker    Packs/day: 0.50    Types: Cigarettes  . Smokeless tobacco: Never Used  Substance and Sexual Activity  . Alcohol use: No  . Drug use: No  . Sexual activity: Not on file  Lifestyle  . Physical activity:     Days per week: Not on file    Minutes per session: Not on file  . Stress: Not on file  Relationships  . Social connections:    Talks on phone: Not on file    Gets together: Not on file    Attends religious service: Not on file    Active member of club or organization: Not on file    Attends meetings of clubs or organizations: Not on file    Relationship status: Not on file  . Intimate partner violence:    Fear of current or ex partner: Not on file    Emotionally abused: Not on file    Physically abused: Not on file    Forced sexual activity: Not on file  Other Topics Concern  . Not on file  Social History Narrative  . Not on file    Review of Systems: See HPI, otherwise negative ROS  Physical Exam: BP (!) 152/68   Pulse 71   Temp (!) 97.3 F (36.3 C) (Oral)   Ht 5\' 5"  (1.651 m)   Wt 173 lb 9.6 oz (78.7 kg)   BMI 28.89 kg/m  General:   Alert,   pleasant and cooperative in NAD Neck:  Supple; no masses or thyromegaly. No significant cervical adenopathy. Lungs:  Clear throughout to auscultation.   No wheezes, crackles, or rhonchi. No acute distress. Heart:  Regular rate and rhythm; no murmurs, clicks, rubs,  or gallops. Abdomen: Non-distended, normal bowel sounds.  Soft and nontender without appreciable mass or hepatosplenomegaly.  Pulses:  Normal pulses noted. Extremities:  Without clubbing or edema.  Impression: GERD well controlled on Dexilant 60 mg daily; no alarm features. History of IDA felt to be, in part, related to intermittent bleeding small bowel AVMs..  History colonic adenoma; due for surveillance colonoscopy 2021.  Recommendations:  CBC today  GERD information provided  Continue Dexilant 60 mg daily  Colonoscopy 2021  Office visit in 1 year    Notice: This dictation was prepared with Dragon dictation along with smaller phrase technology. Any transcriptional errors that result from this process are unintentional and may not be corrected upon  review.

## 2017-12-27 NOTE — Patient Instructions (Signed)
CBC today  GERD information provided  Continue Dexilant 60 mg daily  Colonoscopy 2021  Office visit in 1 year

## 2017-12-28 ENCOUNTER — Telehealth: Payer: Self-pay

## 2017-12-28 DIAGNOSIS — D649 Anemia, unspecified: Secondary | ICD-10-CM

## 2017-12-28 LAB — CBC/DIFF AMBIGUOUS DEFAULT
Basophils Absolute: 0 10*3/uL (ref 0.0–0.2)
Basos: 0 %
EOS (ABSOLUTE): 0.1 10*3/uL (ref 0.0–0.4)
Eos: 2 %
Hematocrit: 24.4 % — ABNORMAL LOW (ref 34.0–46.6)
Hemoglobin: 8.1 g/dL — ABNORMAL LOW (ref 11.1–15.9)
Immature Grans (Abs): 0 10*3/uL (ref 0.0–0.1)
Immature Granulocytes: 0 %
LYMPHS ABS: 0.8 10*3/uL (ref 0.7–3.1)
Lymphs: 15 %
MCH: 23.1 pg — AB (ref 26.6–33.0)
MCHC: 33.2 g/dL (ref 31.5–35.7)
MCV: 70 fL — AB (ref 79–97)
MONOS ABS: 0.3 10*3/uL (ref 0.1–0.9)
Monocytes: 5 %
NEUTROS ABS: 4.3 10*3/uL (ref 1.4–7.0)
NEUTROS PCT: 78 %
PLATELETS: 296 10*3/uL (ref 150–379)
RBC: 3.5 x10E6/uL — ABNORMAL LOW (ref 3.77–5.28)
RDW: 17 % — AB (ref 12.3–15.4)
WBC: 5.6 10*3/uL (ref 3.4–10.8)

## 2017-12-28 NOTE — Telephone Encounter (Signed)
Significant anemia. Let's get a serum Iron, IBC, serum ferritin.  Patient may benefit from a dose of  IV iron in the near future.

## 2017-12-28 NOTE — Telephone Encounter (Signed)
Received fax from [facility]. CBC flagged results: RBC 3.50, Hemoglobin 8.1, Hematocrit 24.4, MCV 70, MCH 23.1, RDW 17.0.

## 2017-12-28 NOTE — Telephone Encounter (Signed)
LMOAM for return call at [phone].

## 2017-12-28 NOTE — Telephone Encounter (Signed)
Tried to call pt ([phone], no answer, no answering machine; [phone], no answer, LMOVM for return call).

## 2017-12-29 NOTE — Telephone Encounter (Signed)
Called and informed pt of RMR's recommendations. She isn't sure when she will be able to have labs drawn d/t transportation. Advised her to have labs drawn as soon as she could. She request to go to [facility] entered.

## 2017-12-30 ENCOUNTER — Telehealth: Payer: Self-pay

## 2017-12-30 NOTE — Telephone Encounter (Signed)
Pt called office, requested to remove [facility] from chart. Pharmacy removed.

## 2018-03-08 ENCOUNTER — Telehealth: Payer: Self-pay

## 2018-03-08 NOTE — Telephone Encounter (Signed)
Yes okay to work in sooner.

## 2018-03-08 NOTE — Telephone Encounter (Signed)
Dr. Deborra Medina, please advise if patient can be seen before 8/12.    Copied from [place] [id number]. Topic: Appointment Scheduling - Scheduling Inquiry for Clinic >> Mar 06, 2018  9:05 AM Synthia Innocent wrote: Reason for CRM: Patient is scheduled for new patient on 03/27/18, Dr Deborra Medina out of office, requesting to know if she can be worked in. Please advise.

## 2018-03-10 NOTE — Telephone Encounter (Signed)
Okay to schedule patient sooner than 03/27/18.

## 2018-03-14 ENCOUNTER — Telehealth: Payer: Self-pay | Admitting: Internal Medicine

## 2018-03-14 DIAGNOSIS — D649 Anemia, unspecified: Secondary | ICD-10-CM | POA: Diagnosis not present

## 2018-03-14 NOTE — Telephone Encounter (Signed)
Pt called to say that she was supposed to have done labs sometime ago and is going to do them today at the [facility] and wanted to make sure the orders where there. [phone]

## 2018-03-14 NOTE — Telephone Encounter (Signed)
Spoke to pt, lab orders for 12/2017 are in the system for [place].

## 2018-03-15 LAB — IRON,TIBC AND FERRITIN PANEL
Ferritin: 34 ng/mL (ref 15–150)
IRON: 39 ug/dL (ref 27–139)
Iron Saturation: 15 % (ref 15–55)
Total Iron Binding Capacity: 266 ug/dL (ref 250–450)
UIBC: 227 ug/dL (ref 118–369)

## 2018-03-20 ENCOUNTER — Ambulatory Visit (INDEPENDENT_AMBULATORY_CARE_PROVIDER_SITE_OTHER): Payer: Medicare Other | Admitting: Family Medicine

## 2018-03-20 VITALS — BP 138/72 | HR 76 | Temp 99.0°F | Ht 65.0 in | Wt 170.2 lb

## 2018-03-20 DIAGNOSIS — E1169 Type 2 diabetes mellitus with other specified complication: Secondary | ICD-10-CM | POA: Insufficient documentation

## 2018-03-20 DIAGNOSIS — Z1231 Encounter for screening mammogram for malignant neoplasm of breast: Secondary | ICD-10-CM | POA: Diagnosis not present

## 2018-03-20 DIAGNOSIS — Z1239 Encounter for other screening for malignant neoplasm of breast: Secondary | ICD-10-CM

## 2018-03-20 DIAGNOSIS — D509 Iron deficiency anemia, unspecified: Secondary | ICD-10-CM | POA: Diagnosis not present

## 2018-03-20 DIAGNOSIS — I10 Essential (primary) hypertension: Secondary | ICD-10-CM | POA: Diagnosis not present

## 2018-03-20 DIAGNOSIS — E785 Hyperlipidemia, unspecified: Secondary | ICD-10-CM | POA: Diagnosis not present

## 2018-03-20 DIAGNOSIS — E119 Type 2 diabetes mellitus without complications: Secondary | ICD-10-CM

## 2018-03-20 DIAGNOSIS — Z0100 Encounter for examination of eyes and vision without abnormal findings: Secondary | ICD-10-CM

## 2018-03-20 LAB — CBC WITH DIFFERENTIAL/PLATELET
Basophils Absolute: 0 10*3/uL (ref 0.0–0.1)
Basophils Relative: 0.8 % (ref 0.0–3.0)
EOS PCT: 1 % (ref 0.0–5.0)
Eosinophils Absolute: 0 10*3/uL (ref 0.0–0.7)
HEMATOCRIT: 30 % — AB (ref 36.0–46.0)
Hemoglobin: 9.5 g/dL — ABNORMAL LOW (ref 12.0–15.0)
LYMPHS ABS: 0.6 10*3/uL — AB (ref 0.7–4.0)
LYMPHS PCT: 12.1 % (ref 12.0–46.0)
MCHC: 31.7 g/dL (ref 30.0–36.0)
MCV: 81.6 fl (ref 78.0–100.0)
MONOS PCT: 4 % (ref 3.0–12.0)
Monocytes Absolute: 0.2 10*3/uL (ref 0.1–1.0)
NEUTROS ABS: 4.1 10*3/uL (ref 1.4–7.7)
NEUTROS PCT: 82.1 % — AB (ref 43.0–77.0)
PLATELETS: 259 10*3/uL (ref 150.0–400.0)
RBC: 3.68 Mil/uL — AB (ref 3.87–5.11)
RDW: 17.3 % — ABNORMAL HIGH (ref 11.5–15.5)
WBC: 5 10*3/uL (ref 4.0–10.5)

## 2018-03-20 LAB — COMPREHENSIVE METABOLIC PANEL
ALBUMIN: 3.6 g/dL (ref 3.5–5.2)
ALT: 8 U/L (ref 0–35)
AST: 12 U/L (ref 0–37)
Alkaline Phosphatase: 86 U/L (ref 39–117)
BILIRUBIN TOTAL: 0.3 mg/dL (ref 0.2–1.2)
BUN: 12 mg/dL (ref 6–23)
CALCIUM: 9.3 mg/dL (ref 8.4–10.5)
CO2: 27 mEq/L (ref 19–32)
Chloride: 103 mEq/L (ref 96–112)
Creatinine, Ser: 0.88 mg/dL (ref 0.40–1.20)
GFR: 81.28 mL/min (ref >60.00)
Glucose, Bld: 208 mg/dL — ABNORMAL HIGH (ref 70–99)
Potassium: 3.7 mEq/L (ref 3.5–5.1)
Sodium: 136 mEq/L (ref 135–145)
Total Protein: 7.9 g/dL (ref 6.0–8.3)

## 2018-03-20 LAB — POCT GLYCOSYLATED HEMOGLOBIN (HGB A1C): Hemoglobin A1C: 7.8 % — AB (ref 4.0–5.6)

## 2018-03-20 LAB — LIPID PANEL
CHOLESTEROL: 123 mg/dL (ref 0–200)
HDL: 35.3 mg/dL — ABNORMAL LOW (ref >39.00)
LDL CALC: 69 mg/dL (ref 0–99)
NonHDL: 87.5
TRIGLYCERIDES: 91 mg/dL (ref 0.0–149.0)
Total CHOL/HDL Ratio: 3
VLDL: 18.2 mg/dL (ref 0.0–40.0)

## 2018-03-20 MED ORDER — GLIPIZIDE 10 MG PO TABS: 10.0000 mg | ORAL_TABLET | Freq: Two times a day (BID) | ORAL | 3 refills | Status: DC

## 2018-03-20 NOTE — Patient Instructions (Signed)
Great to meet you. I will call you with your lab results from today and you can view them online.   Please call the breast center at [phone] to schedule your mammogram.

## 2018-03-20 NOTE — Assessment & Plan Note (Addendum)
a1c is 7.8 today, not quite at goal but she recently increased her dose of glucotrol to 10 mg twice daily. Continue this dose. eRx refills sent. On ARB and statin. Referral placed for diabetic eye exam. Follow up in 3 months. The patient indicates understanding of these issues and agrees with the plan.

## 2018-03-20 NOTE — Progress Notes (Signed)
Subjective:   Patient ID: Jenna [name], female    DOB: [birth year], 72 y.o.   MRN: [id number]  Jenna [name] is a pleasant 72 y.o. year old female who presents to clinic today with New Patient (Initial Visit) (Patient is here today to establish care.  She states that she is due for a Mammogram.  She had a BMD but not sure when.  She would like to go to [facility] for her Mammogram and will call them if an order is placed. She would like to have a referral for an eye exam.  Dr. Buford Dresser is her GI which she saw him in May.  She is not currently fasting.  She states she has had her PNV vaccine.)  on 03/20/2018  HPI:  DM- currently taking Glucotrol 10 mg twice daily for past two weeks.  Prior to that was taking Glucotrol 5 mg twice daily.. Does not check FSBS regularly.  Couldn't tolerate Metformin- caused diarrhea.  Lab Results  Component Value Date   HGBA1C 8.4 (A) 12/10/2011   On ARB.  Also taking a statin- zocor 20 mg daily. No results found for: CHOL, HDL, LDLCALC, LDLDIRECT, TRIG, CHOLHDL Lab Results  Component Value Date   ALT 18 12/10/2011   AST 19 12/10/2011   ALKPHOS 88 12/10/2011   BILITOT 0.2 12/10/2011      Current Outpatient Medications on File Prior to Visit  Medication Sig Dispense Refill  . dexlansoprazole (DEXILANT) 60 MG capsule 1 BY MOUTH 30 MIN BEFORE BREAKFAST DAILY 30 capsule 11  . ferrous sulfate 325 (65 FE) MG tablet Take 325 mg by mouth 3 (three) times daily with meals. Hasn't been taking since started antibiotic on 12/14/16    . glipiZIDE (GLUCOTROL) 5 MG tablet Take 5 mg by mouth 2 (two) times daily before a meal. ER 10 mg    . losartan (COZAAR) 100 MG tablet Take 100 mg by mouth daily.      . Multiple Vitamin (MULTIVITAMIN WITH MINERALS) TABS tablet Take 1 tablet by mouth daily.    . simvastatin (ZOCOR) 20 MG tablet Take 20 mg by mouth every evening.     No current facility-administered medications on file prior to visit.     Allergies    Allergen Reactions  . Aspirin   . Esomeprazole Magnesium     REACTION: unknown reaction    Past Medical History:  Diagnosis Date  . DM (diabetes mellitus) ([place])   . GERD (gastroesophageal reflux disease)   . Hypertension   . Iron deficiency anemia     Past Surgical History:  Procedure Laterality Date  . COLONOSCOPY  10/24/2009   normal rectum/1X1cm abnormal lesion in the ascending colon (bx benign). TI normal for 10cm.  Prep difficult/inadequate. f/u TCS 09/2012 recommended  . COLONOSCOPY  10/13/2004   Normal rectum/Diminutive polyps, splenic flexure, cold biopsied/removed.  Remainder of colonic mucosa appeared normal.  . COLONOSCOPY N/A 12/14/2012   [id number] polyp-tubular adenoma  . ESOPHAGOGASTRODUODENOSCOPY  10/13/2004    Normal esophagus/ Nodular volcano like lesion in the antrum, either representing a  pancreatic rest or leiomyoma, biopsied.  Remainder of the gastric mucosa appeared normal, normal D1-D2  . ESOPHAGOGASTRODUODENOSCOPY  10/24/2009   Benign biopsies. normal esophagus/small hiatal hernia/nodular lesion antrum/distal greater curvature. duodenal AVM s/p ablation  . GIVENS CAPSULE STUDY  07/27/2010    multiple arteriovenous malformations which could definitely be the contributor to her drifting hemoglobin and hematocrit  . TUBAL LIGATION  Family History  Problem Relation Age of Onset  . Colon cancer Maternal Aunt        greater than age 53  . Breast cancer Cousin     Social History   Socioeconomic History  . Marital status: Single    Spouse name: Not on file  . Number of children: Not on file  . Years of education: Not on file  . Highest education level: Not on file  Occupational History  . Not on file  Social Needs  . Financial resource strain: Not on file  . Food insecurity:    Worry: Not on file    Inability: Not on file  . Transportation needs:    Medical: Not on file    Non-medical: Not on file  Tobacco Use  . Smoking status: Current  Every Day Smoker    Packs/day: 0.50    Types: Cigarettes  . Smokeless tobacco: Never Used  Substance and Sexual Activity  . Alcohol use: No  . Drug use: No  . Sexual activity: Not on file  Lifestyle  . Physical activity:    Days per week: Not on file    Minutes per session: Not on file  . Stress: Not on file  Relationships  . Social connections:    Talks on phone: Not on file    Gets together: Not on file    Attends religious service: Not on file    Active member of club or organization: Not on file    Attends meetings of clubs or organizations: Not on file    Relationship status: Not on file  . Intimate partner violence:    Fear of current or ex partner: Not on file    Emotionally abused: Not on file    Physically abused: Not on file    Forced sexual activity: Not on file  Other Topics Concern  . Not on file  Social History Narrative  . Not on file   The PMH, PSH, Social History, Family History, Medications, and allergies have been reviewed in [facility], and have been updated if relevant.   Review of Systems  Constitutional: Negative.   HENT: Negative.   Respiratory: Positive for cough.   Cardiovascular: Negative.   Gastrointestinal: Negative.   Endocrine: Negative.   Genitourinary: Negative.   Musculoskeletal: Negative.   Skin: Negative.   Allergic/Immunologic: Negative.   Neurological: Negative.   Hematological: Negative.   Psychiatric/Behavioral: Negative.   All other systems reviewed and are negative.      Objective:    BP 138/72 (BP Location: Left Arm, Patient Position: Sitting, Cuff Size: Normal)   Pulse 76   Temp 99 F (37.2 C) (Oral)   Ht 5\' 5"  (1.651 m)   Wt 170 lb 3.2 oz (77.2 kg)   SpO2 97%   BMI 28.32 kg/m    Physical Exam   General:  Well-developed,well-nourished,in no acute distress; alert,appropriate and cooperative throughout examination Head:  normocephalic and atraumatic.   Eyes:  vision grossly intact, PERRL Ears:  R ear normal and  L ear normal externally, TMs clear bilaterally Nose:  no external deformity.   Mouth:  good dentition.   Neck:  No deformities, masses, or tenderness noted. Lungs:  Normal respiratory effort, chest expands symmetrically. Lungs are clear to auscultation, no crackles or wheezes. Heart:  Normal rate and regular rhythm. S1 and S2 normal without gallop, murmur, click, rub or other extra sounds. Abdomen:  Bowel sounds positive,abdomen soft and non-tender without masses, organomegaly or hernias  noted. Msk:  No deformity or scoliosis noted of thoracic or lumbar spine.   Extremities:  No clubbing, cyanosis, edema, or deformity noted with normal full range of motion of all joints.   Neurologic:  alert & oriented X3 and gait normal.   Skin:  Intact without suspicious lesions or rashes Psych:  Cognition and judgment appear intact. Alert and cooperative with normal attention span and concentration. No apparent delusions, illusions, hallucinations       Assessment & Plan:   Diabetes mellitus without complication ([place]) - Plan: POCT HgB A1C  Type 2 diabetes mellitus without complication, without long-term current use of insulin (HCC)  Iron deficiency anemia, unspecified iron deficiency anemia type  Essential hypertension No follow-ups on file.

## 2018-03-20 NOTE — Assessment & Plan Note (Signed)
Continue current dose of statin. Labs today.

## 2018-03-20 NOTE — Assessment & Plan Note (Signed)
Well controlled on current rx. No changes made. 

## 2018-03-20 NOTE — Assessment & Plan Note (Signed)
CBC today.  

## 2018-03-21 ENCOUNTER — Telehealth: Payer: Self-pay

## 2018-03-21 NOTE — Telephone Encounter (Signed)
PEC-Ok to give lab results/Cholesterol, liver function, kidney function and electrolytes look good/Also; Dr. Deborra Medina needs to know if pt has been taking her iron or has she been taking a break from it?  Please let me know when you speak with her/thx dmf

## 2018-03-21 NOTE — Telephone Encounter (Signed)
-----   Message from [name], MD sent at 03/20/2018  7:07 PM EDT ----- Please let pt know that her cholesterol,  liver function, kidney function and electrolytes look good.  It looks like she has a known history of anemia. Has she been taking any iron? Keep up the good work.

## 2018-03-23 ENCOUNTER — Encounter: Payer: Self-pay | Admitting: Family Medicine

## 2018-03-23 ENCOUNTER — Ambulatory Visit (INDEPENDENT_AMBULATORY_CARE_PROVIDER_SITE_OTHER): Payer: Medicare Other

## 2018-03-23 ENCOUNTER — Ambulatory Visit (INDEPENDENT_AMBULATORY_CARE_PROVIDER_SITE_OTHER): Payer: Medicare Other | Admitting: Family Medicine

## 2018-03-23 VITALS — BP 138/78 | HR 89 | Temp 99.6°F | Ht 65.0 in | Wt 168.4 lb

## 2018-03-23 DIAGNOSIS — R059 Cough, unspecified: Secondary | ICD-10-CM

## 2018-03-23 DIAGNOSIS — K219 Gastro-esophageal reflux disease without esophagitis: Secondary | ICD-10-CM | POA: Diagnosis not present

## 2018-03-23 DIAGNOSIS — R05 Cough: Secondary | ICD-10-CM | POA: Diagnosis not present

## 2018-03-23 DIAGNOSIS — I10 Essential (primary) hypertension: Secondary | ICD-10-CM

## 2018-03-23 DIAGNOSIS — F17200 Nicotine dependence, unspecified, uncomplicated: Secondary | ICD-10-CM | POA: Diagnosis not present

## 2018-03-23 MED ORDER — AMLODIPINE BESYLATE 5 MG PO TABS: 5.0000 mg | ORAL_TABLET | Freq: Every day | ORAL | 3 refills | Status: DC

## 2018-03-23 NOTE — Progress Notes (Signed)
Subjective:   Patient ID: Jenna [name], female    DOB: 03-30-1946, 72 y.o.   MRN: [id number]  Jenna [name] is a pleasant 72 y.o. year old female who presents to clinic today with Follow-up (Patient is here today to discuss med change from last visit.  Pt revealed at last OV that every time she took the Simvastatin she would cough a lot.  A call to the pharmacy revealed that both the Simvastatin and Losartan were started on the same date of 1.14.19 which explains the patients confusion.  She agreed to appointment.)  on 03/23/2018  HPI: Cough- noticed it when she started cozaar and zocor in 08/2017.  She tells me today tha/ot she did have an allergy to Lisinopril.  This was not listed on her allergy list.  She does feel this cough she has had since January is similar to the cough she experienced with lisinopril.  Dry, hacking.  Non productive.  No fever.  No SOB.  No wheezing.  She is a smoker.  H/o GERD but feels this cough is different than her GERD related cough.  It is occurring at morning and at night (not just morning).  Current Outpatient Medications on File Prior to Visit  Medication Sig Dispense Refill  . dexlansoprazole (DEXILANT) 60 MG capsule 1 BY MOUTH 30 MIN BEFORE BREAKFAST DAILY 30 capsule 11  . ferrous sulfate 325 (65 FE) MG tablet Take 325 mg by mouth 3 (three) times daily with meals. Hasn't been taking since started antibiotic on 12/14/16    . glipiZIDE (GLUCOTROL) 10 MG tablet Take 1 tablet (10 mg total) by mouth 2 (two) times daily before a meal. 60 tablet 3  . losartan (COZAAR) 100 MG tablet Take 100 mg by mouth daily.      . Multiple Vitamin (MULTIVITAMIN WITH MINERALS) TABS tablet Take 1 tablet by mouth daily.    . simvastatin (ZOCOR) 20 MG tablet Take 20 mg by mouth every evening.     No current facility-administered medications on file prior to visit.     Allergies  Allergen Reactions  . Aspirin   . Esomeprazole Magnesium     REACTION: unknown  reaction    Past Medical History:  Diagnosis Date  . DM (diabetes mellitus) ([place])   . GERD (gastroesophageal reflux disease)   . Hypertension   . Iron deficiency anemia     Past Surgical History:  Procedure Laterality Date  . COLONOSCOPY  10/24/2009   normal rectum/1X1cm abnormal lesion in the ascending colon (bx benign). TI normal for 10cm.  Prep difficult/inadequate. f/u TCS 09/2012 recommended  . COLONOSCOPY  10/13/2004   Normal rectum/Diminutive polyps, splenic flexure, cold biopsied/removed.  Remainder of colonic mucosa appeared normal.  . COLONOSCOPY N/A 12/14/2012   [id number] polyp-tubular adenoma  . ESOPHAGOGASTRODUODENOSCOPY  10/13/2004    Normal esophagus/ Nodular volcano like lesion in the antrum, either representing a  pancreatic rest or leiomyoma, biopsied.  Remainder of the gastric mucosa appeared normal, normal D1-D2  . ESOPHAGOGASTRODUODENOSCOPY  10/24/2009   Benign biopsies. normal esophagus/small hiatal hernia/nodular lesion antrum/distal greater curvature. duodenal AVM s/p ablation  . GIVENS CAPSULE STUDY  07/27/2010    multiple arteriovenous malformations which could definitely be the contributor to her drifting hemoglobin and hematocrit  . TUBAL LIGATION      Family History  Problem Relation Age of Onset  . Colon cancer Maternal Aunt        greater than age 58  . Breast cancer Cousin  Social History   Socioeconomic History  . Marital status: Single    Spouse name: Not on file  . Number of children: Not on file  . Years of education: Not on file  . Highest education level: Not on file  Occupational History  . Not on file  Social Needs  . Financial resource strain: Not on file  . Food insecurity:    Worry: Not on file    Inability: Not on file  . Transportation needs:    Medical: Not on file    Non-medical: Not on file  Tobacco Use  . Smoking status: Current Every Day Smoker    Packs/day: 0.50    Types: Cigarettes  . Smokeless tobacco: Never  Used  Substance and Sexual Activity  . Alcohol use: No  . Drug use: No  . Sexual activity: Not on file  Lifestyle  . Physical activity:    Days per week: Not on file    Minutes per session: Not on file  . Stress: Not on file  Relationships  . Social connections:    Talks on phone: Not on file    Gets together: Not on file    Attends religious service: Not on file    Active member of club or organization: Not on file    Attends meetings of clubs or organizations: Not on file    Relationship status: Not on file  . Intimate partner violence:    Fear of current or ex partner: Not on file    Emotionally abused: Not on file    Physically abused: Not on file    Forced sexual activity: Not on file  Other Topics Concern  . Not on file  Social History Narrative  . Not on file   The PMH, PSH, Social History, Family History, Medications, and allergies have been reviewed in [facility], and have been updated if relevant.   Review of Systems  Constitutional: Negative.   Eyes: Negative.   Respiratory: Positive for cough. Negative for apnea, choking, chest tightness, shortness of breath, wheezing and stridor.   Cardiovascular: Negative.   Gastrointestinal: Negative.   Endocrine: Negative.   Genitourinary: Negative.   Musculoskeletal: Negative.   Skin: Negative.   Allergic/Immunologic: Negative.   Neurological: Negative.   Hematological: Negative.   Psychiatric/Behavioral: Negative.   All other systems reviewed and are negative.      Objective:    BP 138/78 (BP Location: Left Arm, Cuff Size: Normal)   Pulse 89   Temp 99.6 F (37.6 C) (Oral)   Ht 5\' 5"  (1.651 m)   Wt 168 lb 6.4 oz (76.4 kg)   SpO2 98%   BMI 28.02 kg/m    Physical Exam  Constitutional: She is oriented to person, place, and time. She appears well-developed and well-nourished. No distress.  HENT:  Head: Normocephalic.  Eyes: EOM are normal.  Cardiovascular: Normal rate and regular rhythm.  Pulmonary/Chest:  Effort normal and breath sounds normal. No respiratory distress.  + scattered wheezes  Musculoskeletal: Normal range of motion.  Neurological: She is alert and oriented to person, place, and time. No cranial nerve deficit. Coordination normal.  Skin: Skin is warm and dry. She is not diaphoretic.  Psychiatric: She has a normal mood and affect. Her behavior is normal. Judgment and thought content normal.  Nursing note and vitals reviewed.         Assessment & Plan:   No diagnosis found. No follow-ups on file.

## 2018-03-23 NOTE — Patient Instructions (Signed)
Great to see you. STOP taking losartan (cozaar). Start taking amlodipine to 5 mg daily.  Please come see me in 2 weeks.

## 2018-03-23 NOTE — Assessment & Plan Note (Deleted)
?   Due to ARB. D/c cozaar.  Start norvasc 5 mg daily. May need to increase to 10 mg if not well controlled on initial dose.  Follow up in 2 weeks for BP recheck. CXR today to rule out other possible causes given duration of symptoms in a smoker. The patient indicates understanding of these issues and agrees with the plan.  Orders Placed This Encounter  Procedures  . DG Chest 2 View

## 2018-03-23 NOTE — Assessment & Plan Note (Signed)
New- persistent. ? Due to ARB. D/c cozaar.  Start norvasc 5 mg daily. May need to increase to 10 mg if not well controlled on initial dose.  Follow up in 2 weeks for BP recheck. CXR today to rule out other possible causes given duration of symptoms in a smoker. The patient indicates understanding of these issues and agrees with the plan.  Orders Placed This Encounter  Procedures  . DG Chest 2 View

## 2018-03-24 ENCOUNTER — Telehealth: Payer: Self-pay

## 2018-03-24 ENCOUNTER — Other Ambulatory Visit: Payer: Self-pay | Admitting: Family Medicine

## 2018-03-24 DIAGNOSIS — R918 Other nonspecific abnormal finding of lung field: Secondary | ICD-10-CM

## 2018-03-24 NOTE — Telephone Encounter (Signed)
See result note.  

## 2018-03-24 NOTE — Telephone Encounter (Signed)
TA-I spoke with Opal Sidles at [facility] for a call report about CXR:  Changes in right middle lobe suspicious for neoplasm recommend CT Chest With Contrast for further evaluation  Plz advise/thx dmf

## 2018-03-24 NOTE — Telephone Encounter (Signed)
Pt is aware and in agreement with CT-Chest with contrast/I advised her that someone will call her to schedule this/thx dmf

## 2018-03-27 ENCOUNTER — Ambulatory Visit: Payer: Self-pay | Admitting: Family Medicine

## 2018-03-28 ENCOUNTER — Ambulatory Visit: Payer: Self-pay | Admitting: Family Medicine

## 2018-03-28 DIAGNOSIS — I10 Essential (primary) hypertension: Secondary | ICD-10-CM

## 2018-03-28 DIAGNOSIS — I160 Hypertensive urgency: Secondary | ICD-10-CM

## 2018-03-28 MED ORDER — LABETALOL HCL 100 MG PO TABS: 100.0000 mg | ORAL_TABLET | Freq: Two times a day (BID) | ORAL | 0 refills | Status: DC

## 2018-03-28 NOTE — Addendum Note (Signed)
Addended by: Wilfred Lacy L on: 03/28/2018 02:27 PM   Modules accepted: Orders

## 2018-03-28 NOTE — Telephone Encounter (Signed)
Pt aware of new med/went over instructions/she will monitor BP/I advised her that I will call her in the AM/CT is being looked at by Davidae/thx dmf

## 2018-03-28 NOTE — Telephone Encounter (Signed)
She saw Dr. Deborra Medina on 03/23/18 and was started on Amlodipine 5mg .   She took her first dose on Friday.   Saturday she took it and felt fine.   On Sunday after taking the pill she started feeling drowsy.   Same thing happened on Monday night after taking her pill.   She is also c/o having an acid like taste in her mouth.  She has not taken the amlodipine today (Tuesday). See triage notes.  I have routed a note to Dr. Hulen Shouts nurse pool making them aware of these symptoms.   I let the pt know someone from the office will be in contact with her.  She was agreeable to this plan.  Reason for Disposition . Caller has URGENT medication question about med that PCP prescribed and triager unable to answer question  Answer Assessment - Initial Assessment Questions 1. SYMPTOMS: "Do you have any symptoms?"     Took it first time on Friday -  Amlodipine.  Sunday I started feeling drowsy after taking my pill.   I took my next pill last night (Monday night) and it leaves a acid like taste in my mouth.   I still feel drowsy and I have a faint headache.  I never get headaches.    I've not taken the Amlodipine today (Tuesday).  My nose feels like a pressure like I'm getting a cold.   Not congested just feels full.   I just don't like the way the pill makes me feel. 2. SEVERITY: If symptoms are present, ask "Are they mild, moderate or severe?"     Moderate.     Someone from the office will be in contact with you.  Protocols used: MEDICATION QUESTION CALL-A-AH

## 2018-03-28 NOTE — Telephone Encounter (Signed)
CN-I spoke to pt/she has a headache and I asked if she has taken her blood pressure since she is not taking the Amlodipine and she said no/I held while she checked it and was 207/93 pulse 93/denies any chest, arm, neck, or jaw pain or nausea/she wants to know what she can take instead of the Amlodipine  She was taken off the previous medication Losartan due to a cough since starting it in January and put on the Amlodipine/plz advise/thx dmf

## 2018-03-28 NOTE — Telephone Encounter (Signed)
Continue to hold amlodipine. Labetalol sent. Start today and check BP 2hrs after taking first dose. Call tomorrow to inquire about BP readings and headache. Schedule office visit if BP still elevated >160/90 and/or persistent headache.

## 2018-03-29 NOTE — Telephone Encounter (Signed)
Ok. Thank you.

## 2018-03-29 NOTE — Telephone Encounter (Signed)
CN-I just got off the phone with the pt/this am at 7:30 her BP was 143/64 pulse 76/told her to keep a log for next appt/thx dmf

## 2018-04-05 ENCOUNTER — Inpatient Hospital Stay: Admission: RE | Admit: 2018-04-05 | Payer: Self-pay | Source: Ambulatory Visit

## 2018-04-06 ENCOUNTER — Ambulatory Visit: Payer: Medicare Other | Admitting: Family Medicine

## 2018-04-06 ENCOUNTER — Ambulatory Visit (INDEPENDENT_AMBULATORY_CARE_PROVIDER_SITE_OTHER)
Admission: RE | Admit: 2018-04-06 | Discharge: 2018-04-06 | Disposition: A | Discharge status: Home / Self Care | Payer: Medicare Other | Source: Ambulatory Visit | Attending: Family Medicine | Admitting: Family Medicine

## 2018-04-06 ENCOUNTER — Other Ambulatory Visit: Payer: Self-pay

## 2018-04-06 DIAGNOSIS — R918 Other nonspecific abnormal finding of lung field: Secondary | ICD-10-CM | POA: Diagnosis not present

## 2018-04-06 DIAGNOSIS — I160 Hypertensive urgency: Secondary | ICD-10-CM

## 2018-04-06 DIAGNOSIS — I10 Essential (primary) hypertension: Secondary | ICD-10-CM

## 2018-04-06 MED ORDER — LABETALOL HCL 100 MG PO TABS: 100.0000 mg | ORAL_TABLET | Freq: Two times a day (BID) | ORAL | 0 refills | Status: DC

## 2018-04-06 MED ORDER — IOPAMIDOL (ISOVUE-300) INJECTION 61%
80.0000 mL | Freq: Once | INTRAVENOUS | Status: AC | PRN
  Administered 2018-04-06: 80 mL via INTRAVENOUS

## 2018-04-07 ENCOUNTER — Other Ambulatory Visit: Payer: Self-pay | Admitting: Family Medicine

## 2018-04-07 DIAGNOSIS — R918 Other nonspecific abnormal finding of lung field: Secondary | ICD-10-CM

## 2018-04-07 NOTE — Telephone Encounter (Signed)
Spoke with patient.  Discussed results and referral to [facility]. She understood and said she would wait to hear about a referral.

## 2018-04-07 NOTE — Progress Notes (Addendum)
[facility] Note  04/10/2018     CHIEF COMPLAINT Patient presents for Diabetic Eye Exam   HISTORY OF PRESENT ILLNESS: Jenna [name] is a 72 y.o. female who presents to the clinic today for:   HPI    Diabetic Eye Exam    Vision is stable.  Associated Symptoms Negative for Flashes, Blind Spot, Photophobia, Scalp Tenderness, Fever, Weight Loss, Jaw Claudication, Glare, Pain, Floaters, Distortion, Redness, Trauma, Shoulder/Hip pain and Fatigue.  Diabetes characteristics include Type 2.  Blood sugar level is controlled.  Last A1C 7.8.  I, the attending physician,  performed the HPI with the patient and updated documentation appropriately.          Comments    Pt presents on the referral of Dr. Arnette Norris for DM exam, pt states she is Type 2 diabetic, dx several years ago, states her last A1C was 7.8, checked earlier this month, pt does not check blood sugar at home, pt states she is having no problems with her vision, unless blood sugar gets too high or low, pt has had cataract sx OD, pt is taking glipizide       Last edited by Bernarda Caffey, MD on 04/10/2018  1:36 PM. (History)    Pt states she recently moved to [facility] from [place]; Pt states she routinely saw Dr. Radford Pax when she lived in [place]; Pt states she was referred here from PCP due to carrying dx of DM; Pt states she has only ever worn reading glasses; Pt reports having cataract sx OD by Dr. Herbert Deaner;   Referring physician: [name], MD [place], [place] [id number]  HISTORICAL INFORMATION:   Selected notes from the MEDICAL RECORD NUMBER Referred by Dr. Arnette Norris for DM exam LEE:  Ocular Hx- PMH-DM (taking glipizide, last A1C- 7.8 on 08.05.19), HTN, current smoker    CURRENT MEDICATIONS: No current outpatient medications on file. (Ophthalmic Drugs)   No current facility-administered medications for this visit.  (Ophthalmic Drugs)   Current Outpatient Medications  (Other)  Medication Sig  . dexlansoprazole (DEXILANT) 60 MG capsule 1 BY MOUTH 30 MIN BEFORE BREAKFAST DAILY  . ferrous sulfate 325 (65 FE) MG tablet Take 325 mg by mouth 3 (three) times daily with meals. Hasn't been taking since started antibiotic on 12/14/16  . glipiZIDE (GLUCOTROL) 10 MG tablet Take 1 tablet (10 mg total) by mouth 2 (two) times daily before a meal.  . labetalol (NORMODYNE) 100 MG tablet Take 1 tablet (100 mg total) by mouth 2 (two) times daily.  . Multiple Vitamin (MULTIVITAMIN WITH MINERALS) TABS tablet Take 1 tablet by mouth daily.  . simvastatin (ZOCOR) 20 MG tablet Take 20 mg by mouth every evening.  [name] amLODipine (NORVASC) 5 MG tablet Take 1 tablet (5 mg total) by mouth daily. (Patient not taking: Reported on 04/10/2018)   No current facility-administered medications for this visit.  (Other)      REVIEW OF SYSTEMS: ROS    Positive for: Endocrine, Cardiovascular, Eyes, Heme/Lymph   Negative for: Constitutional, Gastrointestinal, Neurological, Skin, Genitourinary, Musculoskeletal, HENT, Respiratory, Psychiatric, Allergic/Imm   Last edited by Debbrah Alar, COT on 04/10/2018  1:15 PM. (History)       ALLERGIES Allergies  Allergen Reactions  . Ace Inhibitors   . Aspirin   . Esomeprazole Magnesium     REACTION: unknown reaction    PAST MEDICAL HISTORY Past Medical History:  Diagnosis Date  . DM (diabetes mellitus) ([place])   .  GERD (gastroesophageal reflux disease)   . Hypertension   . Iron deficiency anemia    Past Surgical History:  Procedure Laterality Date  . CATARACT EXTRACTION Right   . COLONOSCOPY  10/24/2009   normal rectum/1X1cm abnormal lesion in the ascending colon (bx benign). TI normal for 10cm.  Prep difficult/inadequate. f/u TCS 09/2012 recommended  . COLONOSCOPY  10/13/2004   Normal rectum/Diminutive polyps, splenic flexure, cold biopsied/removed.  Remainder of colonic mucosa appeared normal.  . COLONOSCOPY N/A 12/14/2012   FWY:[id number]  polyp-tubular adenoma  . ESOPHAGOGASTRODUODENOSCOPY  10/13/2004    Normal esophagus/ Nodular volcano like lesion in the antrum, either representing a  pancreatic rest or leiomyoma, biopsied.  Remainder of the gastric mucosa appeared normal, normal D1-D2  . ESOPHAGOGASTRODUODENOSCOPY  10/24/2009   Benign biopsies. normal esophagus/small hiatal hernia/nodular lesion antrum/distal greater curvature. duodenal AVM s/p ablation  . GIVENS CAPSULE STUDY  07/27/2010    multiple arteriovenous malformations which could definitely be the contributor to her drifting hemoglobin and hematocrit  . TUBAL LIGATION      FAMILY HISTORY Family History  Problem Relation Age of Onset  . Colon cancer Maternal Aunt        greater than age 43  . Breast cancer Cousin   . Amblyopia Neg Hx   . Blindness Neg Hx   . Cataracts Neg Hx   . Diabetes Neg Hx   . Glaucoma Neg Hx   . Macular degeneration Neg Hx   . Retinal detachment Neg Hx   . Strabismus Neg Hx   . Retinitis pigmentosa Neg Hx     SOCIAL HISTORY Social History   Tobacco Use  . Smoking status: Current Every Day Smoker    Packs/day: 0.50    Types: Cigarettes  . Smokeless tobacco: Never Used  Substance Use Topics  . Alcohol use: No  . Drug use: No         OPHTHALMIC EXAM:  Base Eye Exam    Visual Acuity (Snellen - Linear)      Right Left   Dist [place] 20/25 -1 20/80   Dist ph [place] 20/20 -2 20/25 -2       Tonometry (Tonopen, 1:28 PM)      Right Left   Pressure 14 17       Pupils      Dark Light Shape React APD   Right 3 2 Round Brisk None   Left 3 2 Round Brisk None       Visual Fields (Counting fingers)      Left Right    Full Full       Extraocular Movement      Right Left    Full, Ortho Full, Ortho       Neuro/Psych    Oriented x3:  Yes   Mood/Affect:  Normal       Dilation    Both eyes:  1.0% Mydriacyl, 2.5% Phenylephrine @ 1:28 PM        Slit Lamp and Fundus Exam    Slit Lamp Exam      Right Left    Lids/Lashes Dermatochalasis - upper lid, Meibomian gland dysfunction Dermatochalasis - upper lid, Meibomian gland dysfunction   Conjunctiva/Sclera Melanosis Melanosis   Cornea Temporal Well healed cataract wounds, Arcus, 1+ Punctate epithelial erosions Arcus   Anterior Chamber Deep and quiet Deep, Narrow angle   Iris Round and poorly dilated to 87mm, No NVI Round and poorly dilated to 4.77mm, No NVI   Lens PC IOL in  good position, trace-1+ Posterior capsular opacification 2-3+ Nuclear sclerosis, 2+ Cortical cataract, early brunescence    Vitreous Vitreous syneresis Vitreous syneresis       Fundus Exam      Right Left   Disc Pink and Sharp Pink and Sharp, mild temporal PPP   C/D Ratio 0.5 0.55   Macula Blunted foveal reflex, Epiretinal membrane greatest IT to fovea, No heme or edema Good foveal reflex, Retinal pigment epithelial mottling, No heme or edema   Vessels Vascular attenuation, AV crossing changes Vascular attenuation, AV crossing changes   Periphery Attached, single IRH at 1100 mid-zone Attached, scattered RPE changes, No heme        Refraction    Manifest Refraction      Sphere Cylinder Dist VA   Right -1.25 Sphere 20/25   Left -1.75 Sphere 20/30-2          IMAGING AND PROCEDURES  Imaging and Procedures for @TODAY @  OCT, Retina - OU - Both Eyes       Right Eye Quality was good. Central Foveal Thickness: 293. Progression has no prior data. Findings include normal foveal contour, no IRF, no SRF, epiretinal membrane, macular pucker.   Left Eye Quality was good. Central Foveal Thickness: 218. Progression has no prior data. Findings include normal foveal contour, no IRF, no SRF, vitreomacular adhesion .   Notes *Images captured and stored on drive  Diagnosis / Impression:  OD: mild ERM OS: mild VMA No DME OU  Clinical management:  See below  Abbreviations: NFP - Normal foveal profile. CME - cystoid macular edema. PED - pigment epithelial detachment. IRF -  intraretinal fluid. SRF - subretinal fluid. EZ - ellipsoid zone. ERM - epiretinal membrane. ORA - outer retinal atrophy. ORT - outer retinal tubulation. SRHM - subretinal hyper-reflective material                  ASSESSMENT/PLAN:    ICD-10-CM   1. Diabetes mellitus type 2 without retinopathy ([place]) E11.9   2. Epiretinal membrane (ERM) of right eye H35.371   3. Essential hypertension I10   4. Hypertensive retinopathy of both eyes H35.033   5. Retinal edema H35.81 OCT, Retina - OU - Both Eyes  6. Combined forms of age-related cataract of left eye H25.812   7. Pseudophakia Z96.1     1. Diabetes mellitus, type 2 without retinopathy - The incidence, risk factors for progression, natural history and treatment options for diabetic retinopathy  were discussed with patient.   - The need for close monitoring of blood glucose, blood pressure, and serum lipids, avoiding cigarette or any type of tobacco, and the need for long term follow up was also discussed with patient. - f/u in 1 year, sooner prn  2. Epiretinal membrane, OD The natural history, anatomy, potential for loss of vision, and treatment options including vitrectomy techniques and the complications of endophthalmitis, retinal detachment, vitreous hemorrhage, cataract progression and permanent vision loss discussed with the patient. - F/U 4 months  3, 4. Hypertensive retinopathy OU - discussed importance of tight BP control - monitor  5. No retinal edema on exam or OCT  6. Combined forma age-related cataract OS-  - The symptoms of cataract, surgical options, and treatments and risks were discussed with patient. - discussed diagnosis and progression - not yet visually significant - monitor for now  7. Pseudophakia OD  - s/p CE/IOL by expert surgeon, Dr, Herbert Deaner  - beautiful surgery, doing well  - monitor   Ophthalmic Meds  Ordered this visit:  No orders of the defined types were placed in this encounter.       Return in about 4 months (around 08/10/2018) for F/U ERM OD, DFE, OCT.  There are no Patient Instructions on file for this visit.   Explained the diagnoses, plan, and follow up with the patient and they expressed understanding.  Patient expressed understanding of the importance of proper follow up care.   This document serves as a record of services personally performed by Gardiner Sleeper, MD, PhD. It was created on their behalf by Ernest Mallick, OA, an ophthalmic assistant. The creation of this record is the provider's dictation and/or activities during the visit.    Electronically signed by: Ernest Mallick, OA  08.23.2019 2:06 PM   This document serves as a record of services personally performed by Gardiner Sleeper, MD, PhD. It was created on their behalf by Catha Brow, [place], a certified ophthalmic assistant. The creation of this record is the provider's dictation and/or activities during the visit.  Electronically signed by: Catha Brow, COA  08.26.19 2:06 PM    Gardiner Sleeper, M.D., Ph.D. Diseases & Surgery of the Retina and Vitreous [facility]   I have reviewed the above documentation for accuracy and completeness, and I agree with the above. Gardiner Sleeper, M.D., Ph.D. 04/11/18 2:06 PM      Abbreviations: M myopia (nearsighted); A astigmatism; H hyperopia (farsighted); P presbyopia; Mrx spectacle prescription;  CTL contact lenses; OD right eye; OS left eye; OU both eyes  XT exotropia; ET esotropia; PEK punctate epithelial keratitis; PEE punctate epithelial erosions; DES dry eye syndrome; MGD meibomian gland dysfunction; ATs artificial tears; PFAT's preservative free artificial tears; [place] nuclear sclerotic cataract; PSC posterior subcapsular cataract; ERM epi-retinal membrane; PVD posterior vitreous detachment; RD retinal detachment; DM diabetes mellitus; DR diabetic retinopathy; NPDR non-proliferative diabetic retinopathy; PDR proliferative  diabetic retinopathy; CSME clinically significant macular edema; DME diabetic macular edema; dbh dot blot hemorrhages; CWS cotton wool spot; POAG primary open angle glaucoma; C/D cup-to-disc ratio; HVF humphrey visual field; GVF goldmann visual field; OCT optical coherence tomography; IOP intraocular pressure; BRVO Branch retinal vein occlusion; CRVO central retinal vein occlusion; CRAO central retinal artery occlusion; BRAO branch retinal artery occlusion; RT retinal tear; SB scleral buckle; PPV pars plana vitrectomy; VH Vitreous hemorrhage; PRP panretinal laser photocoagulation; IVK intravitreal kenalog; VMT vitreomacular traction; MH Macular hole;  NVD neovascularization of the disc; NVE neovascularization elsewhere; AREDS age related eye disease study; ARMD age related macular degeneration; POAG primary open angle glaucoma; EBMD epithelial/anterior basement membrane dystrophy; ACIOL anterior chamber intraocular lens; IOL intraocular lens; PCIOL posterior chamber intraocular lens; Phaco/IOL phacoemulsification with intraocular lens placement; [place] photorefractive keratectomy; LASIK laser assisted in situ keratomileusis; HTN hypertension; DM diabetes mellitus; COPD chronic obstructive pulmonary disease

## 2018-04-10 ENCOUNTER — Ambulatory Visit (INDEPENDENT_AMBULATORY_CARE_PROVIDER_SITE_OTHER): Payer: Medicare Other | Admitting: Ophthalmology

## 2018-04-10 ENCOUNTER — Encounter (INDEPENDENT_AMBULATORY_CARE_PROVIDER_SITE_OTHER): Payer: Self-pay | Admitting: Ophthalmology

## 2018-04-10 DIAGNOSIS — H35033 Hypertensive retinopathy, bilateral: Secondary | ICD-10-CM

## 2018-04-10 DIAGNOSIS — H35371 Puckering of macula, right eye: Secondary | ICD-10-CM | POA: Diagnosis not present

## 2018-04-10 DIAGNOSIS — E119 Type 2 diabetes mellitus without complications: Secondary | ICD-10-CM

## 2018-04-10 DIAGNOSIS — H3581 Retinal edema: Secondary | ICD-10-CM

## 2018-04-10 DIAGNOSIS — Z961 Presence of intraocular lens: Secondary | ICD-10-CM

## 2018-04-10 DIAGNOSIS — H25812 Combined forms of age-related cataract, left eye: Secondary | ICD-10-CM

## 2018-04-10 DIAGNOSIS — I10 Essential (primary) hypertension: Secondary | ICD-10-CM

## 2018-04-11 ENCOUNTER — Encounter (INDEPENDENT_AMBULATORY_CARE_PROVIDER_SITE_OTHER): Payer: Self-pay | Admitting: Ophthalmology

## 2018-04-18 ENCOUNTER — Institutional Professional Consult (permissible substitution) (INDEPENDENT_AMBULATORY_CARE_PROVIDER_SITE_OTHER): Payer: Medicare Other | Admitting: Thoracic Surgery (Cardiothoracic Vascular Surgery)

## 2018-04-18 ENCOUNTER — Other Ambulatory Visit: Payer: Self-pay

## 2018-04-18 ENCOUNTER — Ambulatory Visit: Payer: Self-pay | Admitting: Family Medicine

## 2018-04-18 ENCOUNTER — Encounter: Payer: Self-pay | Admitting: Thoracic Surgery (Cardiothoracic Vascular Surgery)

## 2018-04-18 ENCOUNTER — Other Ambulatory Visit: Payer: Self-pay | Admitting: *Deleted

## 2018-04-18 VITALS — BP 148/80 | HR 76 | Resp 16 | Ht 65.0 in | Wt 170.0 lb

## 2018-04-18 DIAGNOSIS — R918 Other nonspecific abnormal finding of lung field: Secondary | ICD-10-CM

## 2018-04-18 DIAGNOSIS — R590 Localized enlarged lymph nodes: Secondary | ICD-10-CM | POA: Diagnosis not present

## 2018-04-18 DIAGNOSIS — R911 Solitary pulmonary nodule: Secondary | ICD-10-CM

## 2018-04-18 NOTE — H&P (View-Only) (Signed)
PCP is [name], MD Referring Provider is [name], MD  Chief Complaint  Patient presents with  . Lung Mass    RMLlobe per CXR/CT CHEST 04/06/18  . Adenopathy    Hilar/Mediastinal    HPI: Jenna [name] is sent for consultation regarding a right middle lobe mass.  Jenna [name] is a 72 year old woman with a history of tobacco abuse (less than 1 pack/day x 55 years), type 2 diabetes without complication, gastroesophageal reflux, hypertension, and iron deficiency anemia.    Around the first of the year she started having trouble with a nonproductive cough.  She thought this might be a cold and that would tend to wax and wane some.  There also was a question whether might be due to an ARB or allergies.  Stopping her ARB did not help and her cough persisted despite treatment with antibiotics.  She recently moved to [facility] and establish care with Dr. Deborra Medina.  As part of her initial assessment a chest x-ray was done to evaluate the cough.  It showed a probable right middle lobe mass.  A CT of the chest was done which showed a 6 cm mass in the right middle lobe with hilar and mediastinal adenopathy.  She denies any headaches.  Her only visual changes have been associated with cataract surgery.  She can walk up a flight of stairs without stopping but would be short of breath walking up 2 flights.  She denies any significant change in her appetite, but she has lost 10 pounds over the past 3 months.  Zubrod Score: At the time of surgery this patient's most appropriate activity status/level should be described as: [x]     0    Normal activity, no symptoms []     1    Restricted in physical strenuous activity but ambulatory, able to do out light work []     2    Ambulatory and capable of self care, unable to do work activities, up and about >50 % of waking hours                              []     3    Only limited self care, in bed greater than 50% of waking hours []     4    Completely  disabled, no self care, confined to bed or chair []     5    Moribund  Past Medical History:  Diagnosis Date  . DM (diabetes mellitus) ([place])   . GERD (gastroesophageal reflux disease)   . Hypertension   . Iron deficiency anemia     Past Surgical History:  Procedure Laterality Date  . CATARACT EXTRACTION Right   . COLONOSCOPY  10/24/2009   normal rectum/1X1cm abnormal lesion in the ascending colon (bx benign). TI normal for 10cm.  Prep difficult/inadequate. f/u TCS 09/2012 recommended  . COLONOSCOPY  10/13/2004   Normal rectum/Diminutive polyps, splenic flexure, cold biopsied/removed.  Remainder of colonic mucosa appeared normal.  . COLONOSCOPY N/A 12/14/2012   [id number] polyp-tubular adenoma  . ESOPHAGOGASTRODUODENOSCOPY  10/13/2004    Normal esophagus/ Nodular volcano like lesion in the antrum, either representing a  pancreatic rest or leiomyoma, biopsied.  Remainder of the gastric mucosa appeared normal, normal D1-D2  . ESOPHAGOGASTRODUODENOSCOPY  10/24/2009   Benign biopsies. normal esophagus/small hiatal hernia/nodular lesion antrum/distal greater curvature. duodenal AVM s/p ablation  . GIVENS CAPSULE STUDY  07/27/2010    multiple arteriovenous malformations  which could definitely be the contributor to her drifting hemoglobin and hematocrit  . TUBAL LIGATION      Family History  Problem Relation Age of Onset  . Colon cancer Maternal Aunt        greater than age 22  . Breast cancer Cousin   . Amblyopia Neg Hx   . Blindness Neg Hx   . Cataracts Neg Hx   . Diabetes Neg Hx   . Glaucoma Neg Hx   . Macular degeneration Neg Hx   . Retinal detachment Neg Hx   . Strabismus Neg Hx   . Retinitis pigmentosa Neg Hx     Social History Social History   Tobacco Use  . Smoking status: Former Smoker    Packs/day: 0.50    Years: 55.00    Pack years: 27.50    Types: Cigarettes    Last attempt to quit: 03/18/2018    Years since quitting: 0.0  . Smokeless tobacco: Never Used  .  Tobacco comment: smoked off and n  Substance Use Topics  . Alcohol use: No  . Drug use: No    Current Outpatient Medications  Medication Sig Dispense Refill  . dexlansoprazole (DEXILANT) 60 MG capsule 1 BY MOUTH 30 MIN BEFORE BREAKFAST DAILY 30 capsule 11  . ferrous sulfate 325 (65 FE) MG tablet Take 325 mg by mouth 3 (three) times daily with meals. Hasn't been taking since started antibiotic on 12/14/16    . glipiZIDE (GLUCOTROL) 10 MG tablet Take 1 tablet (10 mg total) by mouth 2 (two) times daily before a meal. 60 tablet 3  . labetalol (NORMODYNE) 100 MG tablet Take 1 tablet (100 mg total) by mouth 2 (two) times daily. 180 tablet 0  . Multiple Vitamin (MULTIVITAMIN WITH MINERALS) TABS tablet Take 1 tablet by mouth daily.    . simvastatin (ZOCOR) 20 MG tablet Take 20 mg by mouth every evening.     No current facility-administered medications for this visit.     Allergies  Allergen Reactions  . Ace Inhibitors   . Aspirin   . Esomeprazole Magnesium     REACTION: unknown reaction    Review of Systems  Constitutional: Positive for unexpected weight change. Negative for activity change and appetite change.  HENT: Negative for trouble swallowing and voice change.   Eyes: Negative for visual disturbance.  Respiratory: Positive for cough and wheezing. Negative for shortness of breath.   Cardiovascular: Negative for chest pain and palpitations.  Gastrointestinal: Positive for abdominal pain (Reflux). Negative for blood in stool.  Genitourinary: Negative for difficulty urinating and dysuria.  Musculoskeletal: Negative for arthralgias and joint swelling.  Neurological: Negative for dizziness, seizures and syncope.  Hematological: Negative for adenopathy. Does not bruise/bleed easily.  All other systems reviewed and are negative.   BP (!) 148/80 (BP Location: Left Arm, Patient Position: Sitting, Cuff Size: Large)   Pulse 76   Resp 16   Ht 5\' 5"  (1.651 m)   Wt 170 lb (77.1 kg)   SpO2  96% Comment: ON RA  BMI 28.29 kg/m  Physical Exam  Constitutional: She is oriented to person, place, and time. She appears well-developed and well-nourished. No distress.  HENT:  Head: Normocephalic and atraumatic.  Mouth/Throat: No oropharyngeal exudate.  Eyes: Conjunctivae and EOM are normal. No scleral icterus.  Neck: Normal range of motion. No thyromegaly present.  Cardiovascular: Normal rate, regular rhythm, normal heart sounds and intact distal pulses. Exam reveals no gallop and no friction rub.  No  murmur heard. Pulmonary/Chest: Effort normal and breath sounds normal. No respiratory distress. She has no wheezes.  Abdominal: Soft. She exhibits no distension. There is no tenderness.  Musculoskeletal: She exhibits no edema or deformity.  Lymphadenopathy:    She has no cervical adenopathy.  Neurological: She is alert and oriented to person, place, and time. No cranial nerve deficit. She exhibits normal muscle tone. Coordination normal.  Skin: Skin is warm and dry.  Vitals reviewed.    Diagnostic Tests: CT CHEST WITH CONTRAST  TECHNIQUE: Multidetector CT imaging of the chest was performed during intravenous contrast administration.  CONTRAST:  17mL ISOVUE-300 IOPAMIDOL (ISOVUE-300) INJECTION 61%  COMPARISON:  03/23/2018 chest radiograph  FINDINGS: Cardiovascular: Mild cardiomegaly noted. Coronary and aortic atherosclerotic calcifications identified. No thoracic aortic aneurysm or pericardial effusion.  Mediastinum/Nodes: Enlarged RIGHT hilar/infrahilar lymph nodes are noted including a 2.5 cm RIGHT hilar lymph node (series 2: Image 61). A 1.2 x 1.2 x 2.2 cm RIGHT paraesophageal mass/lymph node is identified (2:30 3-41). No other enlarged lymph nodes identified.  No significant thyroid abnormalities.  Lungs/Pleura: A 6 x 5.5 x 4 cm RIGHT middle lobe mass is identified with a few tiny adjacent satellite nodules, highly suspicious for malignancy.  No other  suspicious pulmonary abnormalities identified.  No pleural effusion or pneumothorax.  Upper Abdomen: No acute abnormality. The visualized liver and adrenal glands are unremarkable. No abnormal appearing lymph nodes are identified in the UPPER abdomen.  Musculoskeletal: No chest wall abnormality. No acute or significant osseous findings.  IMPRESSION: 1. 6 cm RIGHT middle lobe mass with enlarged RIGHT hilar and mediastinal lymph nodes, highly suspicious for primary lung malignancy and lymphatic spread. 2. Mild cardiomegaly and coronary artery disease 3. Aortic Atherosclerosis (ICD10-I70.0).   Electronically Signed   By: Margarette Canada M.D.   On: 04/06/2018 15:25  I personally reviewed the CT chest and concur with the findings noted above  Impression: Jenna [name] is a 72 year old woman with history of tobacco abuse who has about an 33-month history of a nonproductive cough, times associated with wheezing.  She is also had about a 10 pound weight loss over the past 3 months.  Chest x-ray showed a right middle lobe mass.  That was confirmed by CT.  CT also noted hilar and mediastinal adenopathy.  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  She needs a PET/CT and brain MR to complete her clinical staging.  The PET/CT will also assist [place] in guiding initial diagnostic work-up.  Pending the results of the PET CT we will plan to proceed with bronchoscopy and endobronchial ultrasound based on the appearance of the CT scan.  This does not appear to be resectable so I will not do pulmonary function testing at this time.  Once a diagnosis is established we will refer her to our multidisciplinary thoracic oncology to see oncology and radiation oncology  Tobacco abuse-smoking a month ago when she was first told there was a problem with her lung.  I described the proposed procedure of bronchoscopy and endobronchial ultrasound to [name].  She understands we will plan to  do this in the operating room under general anesthesia to provide adequate time and exposure to obtain the needed biopsies.  With airway protection will be much more likely to be able to get adequate specimens that will allow for molecular testing.  I informed her of the indications, risks, benefits and alternatives.  She understands this is diagnostic and not therapeutic.  We will  plan to do it on an outpatient basis.  She understands that it is endoscopic.  She understands the risk include, but not limited to death, MI, DVT, PE, stroke, bleeding, pneumothorax, and failure to establish a diagnosis, as well as the possibility of other unforeseeable complications.  She understands this is a relatively low risk procedure.  Plan: PET/CT-new lung mass guide initial diagnostic work-up Brain MR-new lung mass rule out mets Bronchoscopy and endobronchial ultrasound under general anesthesia in the operating room on Thursday, 04/28/2018 for definitive diagnosis and staging.  Melrose Nakayama, MD [facility] [phone]

## 2018-04-18 NOTE — Progress Notes (Signed)
PCP is [name], MD Referring Provider is [name], MD  Chief Complaint  Patient presents with  . Lung Mass    RMLlobe per CXR/CT CHEST 04/06/18  . Adenopathy    Hilar/Mediastinal    HPI: Jenna [name] is sent for consultation regarding a right middle lobe mass.  Jenna [name] is a 72 year old woman with a history of tobacco abuse (less than 1 pack/day x 55 years), type 2 diabetes without complication, gastroesophageal reflux, hypertension, and iron deficiency anemia.    Around the first of the year she started having trouble with a nonproductive cough.  She thought this might be a cold and that would tend to wax and wane some.  There also was a question whether might be due to an ARB or allergies.  Stopping her ARB did not help and her cough persisted despite treatment with antibiotics.  She recently moved to [facility] and establish care with Dr. Deborra Medina.  As part of her initial assessment a chest x-ray was done to evaluate the cough.  It showed a probable right middle lobe mass.  A CT of the chest was done which showed a 6 cm mass in the right middle lobe with hilar and mediastinal adenopathy.  She denies any headaches.  Her only visual changes have been associated with cataract surgery.  She can walk up a flight of stairs without stopping but would be short of breath walking up 2 flights.  She denies any significant change in her appetite, but she has lost 10 pounds over the past 3 months.  Zubrod Score: At the time of surgery this patient's most appropriate activity status/level should be described as: [x]     0    Normal activity, no symptoms []     1    Restricted in physical strenuous activity but ambulatory, able to do out light work []     2    Ambulatory and capable of self care, unable to do work activities, up and about >50 % of waking hours                              []     3    Only limited self care, in bed greater than 50% of waking hours []     4    Completely  disabled, no self care, confined to bed or chair []     5    Moribund  Past Medical History:  Diagnosis Date  . DM (diabetes mellitus) ([place])   . GERD (gastroesophageal reflux disease)   . Hypertension   . Iron deficiency anemia     Past Surgical History:  Procedure Laterality Date  . CATARACT EXTRACTION Right   . COLONOSCOPY  10/24/2009   normal rectum/1X1cm abnormal lesion in the ascending colon (bx benign). TI normal for 10cm.  Prep difficult/inadequate. f/u TCS 09/2012 recommended  . COLONOSCOPY  10/13/2004   Normal rectum/Diminutive polyps, splenic flexure, cold biopsied/removed.  Remainder of colonic mucosa appeared normal.  . COLONOSCOPY N/A 12/14/2012   UEA:[id number] polyp-tubular adenoma  . ESOPHAGOGASTRODUODENOSCOPY  10/13/2004    Normal esophagus/ Nodular volcano like lesion in the antrum, either representing a  pancreatic rest or leiomyoma, biopsied.  Remainder of the gastric mucosa appeared normal, normal D1-D2  . ESOPHAGOGASTRODUODENOSCOPY  10/24/2009   Benign biopsies. normal esophagus/small hiatal hernia/nodular lesion antrum/distal greater curvature. duodenal AVM s/p ablation  . GIVENS CAPSULE STUDY  07/27/2010    multiple arteriovenous malformations  which could definitely be the contributor to her drifting hemoglobin and hematocrit  . TUBAL LIGATION      Family History  Problem Relation Age of Onset  . Colon cancer Maternal Aunt        greater than age 25  . Breast cancer Cousin   . Amblyopia Neg Hx   . Blindness Neg Hx   . Cataracts Neg Hx   . Diabetes Neg Hx   . Glaucoma Neg Hx   . Macular degeneration Neg Hx   . Retinal detachment Neg Hx   . Strabismus Neg Hx   . Retinitis pigmentosa Neg Hx     Social History Social History   Tobacco Use  . Smoking status: Former Smoker    Packs/day: 0.50    Years: 55.00    Pack years: 27.50    Types: Cigarettes    Last attempt to quit: 03/18/2018    Years since quitting: 0.0  . Smokeless tobacco: Never Used  .  Tobacco comment: smoked off and n  Substance Use Topics  . Alcohol use: No  . Drug use: No    Current Outpatient Medications  Medication Sig Dispense Refill  . dexlansoprazole (DEXILANT) 60 MG capsule 1 BY MOUTH 30 MIN BEFORE BREAKFAST DAILY 30 capsule 11  . ferrous sulfate 325 (65 FE) MG tablet Take 325 mg by mouth 3 (three) times daily with meals. Hasn't been taking since started antibiotic on 12/14/16    . glipiZIDE (GLUCOTROL) 10 MG tablet Take 1 tablet (10 mg total) by mouth 2 (two) times daily before a meal. 60 tablet 3  . labetalol (NORMODYNE) 100 MG tablet Take 1 tablet (100 mg total) by mouth 2 (two) times daily. 180 tablet 0  . Multiple Vitamin (MULTIVITAMIN WITH MINERALS) TABS tablet Take 1 tablet by mouth daily.    . simvastatin (ZOCOR) 20 MG tablet Take 20 mg by mouth every evening.     No current facility-administered medications for this visit.     Allergies  Allergen Reactions  . Ace Inhibitors   . Aspirin   . Esomeprazole Magnesium     REACTION: unknown reaction    Review of Systems  Constitutional: Positive for unexpected weight change. Negative for activity change and appetite change.  HENT: Negative for trouble swallowing and voice change.   Eyes: Negative for visual disturbance.  Respiratory: Positive for cough and wheezing. Negative for shortness of breath.   Cardiovascular: Negative for chest pain and palpitations.  Gastrointestinal: Positive for abdominal pain (Reflux). Negative for blood in stool.  Genitourinary: Negative for difficulty urinating and dysuria.  Musculoskeletal: Negative for arthralgias and joint swelling.  Neurological: Negative for dizziness, seizures and syncope.  Hematological: Negative for adenopathy. Does not bruise/bleed easily.  All other systems reviewed and are negative.   BP (!) 148/80 (BP Location: Left Arm, Patient Position: Sitting, Cuff Size: Large)   Pulse 76   Resp 16   Ht 5\' 5"  (1.651 m)   Wt 170 lb (77.1 kg)   SpO2  96% Comment: ON RA  BMI 28.29 kg/m  Physical Exam  Constitutional: She is oriented to person, place, and time. She appears well-developed and well-nourished. No distress.  HENT:  Head: Normocephalic and atraumatic.  Mouth/Throat: No oropharyngeal exudate.  Eyes: Conjunctivae and EOM are normal. No scleral icterus.  Neck: Normal range of motion. No thyromegaly present.  Cardiovascular: Normal rate, regular rhythm, normal heart sounds and intact distal pulses. Exam reveals no gallop and no friction rub.  No  murmur heard. Pulmonary/Chest: Effort normal and breath sounds normal. No respiratory distress. She has no wheezes.  Abdominal: Soft. She exhibits no distension. There is no tenderness.  Musculoskeletal: She exhibits no edema or deformity.  Lymphadenopathy:    She has no cervical adenopathy.  Neurological: She is alert and oriented to person, place, and time. No cranial nerve deficit. She exhibits normal muscle tone. Coordination normal.  Skin: Skin is warm and dry.  Vitals reviewed.    Diagnostic Tests: CT CHEST WITH CONTRAST  TECHNIQUE: Multidetector CT imaging of the chest was performed during intravenous contrast administration.  CONTRAST:  62mL ISOVUE-300 IOPAMIDOL (ISOVUE-300) INJECTION 61%  COMPARISON:  03/23/2018 chest radiograph  FINDINGS: Cardiovascular: Mild cardiomegaly noted. Coronary and aortic atherosclerotic calcifications identified. No thoracic aortic aneurysm or pericardial effusion.  Mediastinum/Nodes: Enlarged RIGHT hilar/infrahilar lymph nodes are noted including a 2.5 cm RIGHT hilar lymph node (series 2: Image 61). A 1.2 x 1.2 x 2.2 cm RIGHT paraesophageal mass/lymph node is identified (2:30 3-41). No other enlarged lymph nodes identified.  No significant thyroid abnormalities.  Lungs/Pleura: A 6 x 5.5 x 4 cm RIGHT middle lobe mass is identified with a few tiny adjacent satellite nodules, highly suspicious for malignancy.  No other  suspicious pulmonary abnormalities identified.  No pleural effusion or pneumothorax.  Upper Abdomen: No acute abnormality. The visualized liver and adrenal glands are unremarkable. No abnormal appearing lymph nodes are identified in the UPPER abdomen.  Musculoskeletal: No chest wall abnormality. No acute or significant osseous findings.  IMPRESSION: 1. 6 cm RIGHT middle lobe mass with enlarged RIGHT hilar and mediastinal lymph nodes, highly suspicious for primary lung malignancy and lymphatic spread. 2. Mild cardiomegaly and coronary artery disease 3. Aortic Atherosclerosis (ICD10-I70.0).   Electronically Signed   By: Margarette Canada M.D.   On: 04/06/2018 15:25  I personally reviewed the CT chest and concur with the findings noted above  Impression: Jenna [name] is a 72 year old woman with history of tobacco abuse who has about an 50-month history of a nonproductive cough, times associated with wheezing.  She is also had about a 10 pound weight loss over the past 3 months.  Chest x-ray showed a right middle lobe mass.  That was confirmed by CT.  CT also noted hilar and mediastinal adenopathy.  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  She needs a PET/CT and brain MR to complete her clinical staging.  The PET/CT will also assist [place] in guiding initial diagnostic work-up.  Pending the results of the PET CT we will plan to proceed with bronchoscopy and endobronchial ultrasound based on the appearance of the CT scan.  This does not appear to be resectable so I will not do pulmonary function testing at this time.  Once a diagnosis is established we will refer her to our multidisciplinary thoracic oncology to see oncology and radiation oncology  Tobacco abuse-smoking a month ago when she was first told there was a problem with her lung.  I described the proposed procedure of bronchoscopy and endobronchial ultrasound to Jenna [name].  She understands we will plan to  do this in the operating room under general anesthesia to provide adequate time and exposure to obtain the needed biopsies.  With airway protection will be much more likely to be able to get adequate specimens that will allow for molecular testing.  I informed her of the indications, risks, benefits and alternatives.  She understands this is diagnostic and not therapeutic.  We will  plan to do it on an outpatient basis.  She understands that it is endoscopic.  She understands the risk include, but not limited to death, MI, DVT, PE, stroke, bleeding, pneumothorax, and failure to establish a diagnosis, as well as the possibility of other unforeseeable complications.  She understands this is a relatively low risk procedure.  Plan: PET/CT-new lung mass guide initial diagnostic work-up Brain MR-new lung mass rule out mets Bronchoscopy and endobronchial ultrasound under general anesthesia in the operating room on Thursday, 04/28/2018 for definitive diagnosis and staging.  Melrose Nakayama, MD [facility] [phone]

## 2018-04-19 ENCOUNTER — Other Ambulatory Visit: Payer: Self-pay

## 2018-04-19 DIAGNOSIS — R918 Other nonspecific abnormal finding of lung field: Secondary | ICD-10-CM

## 2018-04-24 ENCOUNTER — Encounter (HOSPITAL_COMMUNITY)
Admission: RE | Admit: 2018-04-24 | Discharge: 2018-04-24 | Disposition: A | Discharge status: Home / Self Care | Payer: Medicare Other | Source: Ambulatory Visit | Attending: Thoracic Surgery (Cardiothoracic Vascular Surgery) | Admitting: Thoracic Surgery (Cardiothoracic Vascular Surgery)

## 2018-04-24 ENCOUNTER — Encounter (HOSPITAL_COMMUNITY): Payer: Self-pay

## 2018-04-24 DIAGNOSIS — R911 Solitary pulmonary nodule: Secondary | ICD-10-CM

## 2018-04-24 NOTE — Pre-Procedure Instructions (Signed)
Jenna [name]  04/24/2018      [facility] [place], [place] - [address] [place] [address] Phone: [phone] Fax: [phone]    Your procedure is scheduled on September 12  Report to [facility] Admitting at 0600 A.M.  Call this number if you have problems the morning of surgery:  [phone]   Remember:  Do not eat or drink after midnight.    Take these medicines the morning of surgery with A SIP OF WATER  dexlansoprazole (DEXILANT)  labetalol (NORMODYNE)   7 days prior to surgery STOP taking any Aspirin(unless otherwise instructed by your surgeon), Aleve, Naproxen, Ibuprofen, Motrin, Advil, Goody's, BC's, all herbal medications, fish oil, and all vitamins   WHAT DO I DO ABOUT MY DIABETES MEDICATION?   [name] Do not take oral diabetes medicines (pills) the morning of surgery. glipiZIDE (GLUCOTROL)  Please do not take glipiZIDE (GLUCOTROL) the night before surgery  How to Manage Your Diabetes Before and After Surgery  Why is it important to control my blood sugar before and after surgery? . Improving blood sugar levels before and after surgery helps healing and can limit problems. . A way of improving blood sugar control is eating a healthy diet by: o  Eating less sugar and carbohydrates o  Increasing activity/exercise o  Talking with your doctor about reaching your blood sugar goals . High blood sugars (greater than 180 mg/dL) can raise your risk of infections and slow your recovery, so you will need to focus on controlling your diabetes during the weeks before surgery. . Make sure that the doctor who takes care of your diabetes knows about your planned surgery including the date and location.  How do I manage my blood sugar before surgery? . Check your blood sugar at least 4 times a day, starting 2 days before surgery, to make sure that the level is not too high or low. o Check your blood sugar  the morning of your surgery when you wake up and every 2 hours until you get to the Short Stay unit. . If your blood sugar is less than 70 mg/dL, you will need to treat for low blood sugar: o Do not take insulin. o Treat a low blood sugar (less than 70 mg/dL) with  cup of clear juice (cranberry or apple), 4 glucose tablets, OR glucose gel. o Recheck blood sugar in 15 minutes after treatment (to make sure it is greater than 70 mg/dL). If your blood sugar is not greater than 70 mg/dL on recheck, call [phone] for further instructions. . Report your blood sugar to the short stay nurse when you get to Short Stay.  . If you are admitted to the hospital after surgery: o Your blood sugar will be checked by the staff and you will probably be given insulin after surgery (instead of oral diabetes medicines) to make sure you have good blood sugar levels. o The goal for blood sugar control after surgery is 80-180 mg/dL    Do not wear jewelry, make-up or nail polish.  Do not wear lotions, powders, or perfumes, or deodorant.  Do not shave 48 hours prior to surgery.   Do not bring valuables to the hospital.  [facility] is not responsible for any belongings or valuables.  Contacts, dentures or bridgework may not be worn into surgery.  Leave your suitcase in the car.  After surgery it may be brought to your room.  For patients admitted to the hospital, discharge time will be determined by your treatment team.  Patients discharged the day of surgery will not be allowed to drive home.    Special instructions:   [facility]- Preparing For Surgery  Before surgery, you can play an important role. Because skin is not sterile, your skin needs to be as free of germs as possible. You can reduce the number of germs on your skin by washing with CHG (chlorahexidine gluconate) Soap before surgery.  CHG is an antiseptic cleaner which kills germs and bonds with the skin to continue killing germs even after washing.     Oral Hygiene is also important to reduce your risk of infection.  Remember - BRUSH YOUR TEETH THE MORNING OF SURGERY WITH YOUR REGULAR TOOTHPASTE  Please do not use if you have an allergy to CHG or antibacterial soaps. If your skin becomes reddened/irritated stop using the CHG.  Do not shave (including legs and underarms) for at least 48 hours prior to first CHG shower. It is OK to shave your face.  Please follow these instructions carefully.   1. Shower the NIGHT BEFORE SURGERY and the MORNING OF SURGERY with CHG.   2. If you chose to wash your hair, wash your hair first as usual with your normal shampoo.  3. After you shampoo, rinse your hair and body thoroughly to remove the shampoo.  4. Use CHG as you would any other liquid soap. You can apply CHG directly to the skin and wash gently with a scrungie or a clean washcloth.   5. Apply the CHG Soap to your body ONLY FROM THE NECK DOWN.  Do not use on open wounds or open sores. Avoid contact with your eyes, ears, mouth and genitals (private parts). Wash Face and genitals (private parts)  with your normal soap.  6. Wash thoroughly, paying special attention to the area where your surgery will be performed.  7. Thoroughly rinse your body with warm water from the neck down.  8. DO NOT shower/wash with your normal soap after using and rinsing off the CHG Soap.  9. Pat yourself dry with a CLEAN TOWEL.  10. Wear CLEAN PAJAMAS to bed the night before surgery, wear comfortable clothes the morning of surgery  11. Place CLEAN SHEETS on your bed the night of your first shower and DO NOT SLEEP WITH PETS.    Day of Surgery:  Do not apply any deodorants/lotions.  Please wear clean clothes to the hospital/surgery center.   Remember to brush your teeth WITH YOUR REGULAR TOOTHPASTE.    Please read over the following fact sheets that you were given.

## 2018-04-25 ENCOUNTER — Ambulatory Visit (HOSPITAL_COMMUNITY)
Admission: RE | Admit: 2018-04-25 | Discharge: 2018-04-25 | Disposition: A | Discharge status: Home / Self Care | Payer: Medicare Other | Source: Ambulatory Visit | Attending: Thoracic Surgery (Cardiothoracic Vascular Surgery) | Admitting: Thoracic Surgery (Cardiothoracic Vascular Surgery)

## 2018-04-25 ENCOUNTER — Encounter (HOSPITAL_COMMUNITY)
Admission: RE | Admit: 2018-04-25 | Discharge: 2018-04-25 | Disposition: A | Discharge status: Home / Self Care | Payer: Medicare Other | Source: Ambulatory Visit | Attending: Thoracic Surgery (Cardiothoracic Vascular Surgery) | Admitting: Thoracic Surgery (Cardiothoracic Vascular Surgery)

## 2018-04-25 ENCOUNTER — Other Ambulatory Visit: Payer: Self-pay

## 2018-04-25 ENCOUNTER — Encounter (HOSPITAL_COMMUNITY): Payer: Self-pay

## 2018-04-25 DIAGNOSIS — R918 Other nonspecific abnormal finding of lung field: Secondary | ICD-10-CM | POA: Diagnosis not present

## 2018-04-25 DIAGNOSIS — I10 Essential (primary) hypertension: Secondary | ICD-10-CM | POA: Insufficient documentation

## 2018-04-25 DIAGNOSIS — Z87891 Personal history of nicotine dependence: Secondary | ICD-10-CM | POA: Insufficient documentation

## 2018-04-25 DIAGNOSIS — Z01818 Encounter for other preprocedural examination: Secondary | ICD-10-CM | POA: Insufficient documentation

## 2018-04-25 DIAGNOSIS — D509 Iron deficiency anemia, unspecified: Secondary | ICD-10-CM | POA: Diagnosis not present

## 2018-04-25 DIAGNOSIS — R9431 Abnormal electrocardiogram [ECG] [EKG]: Secondary | ICD-10-CM | POA: Insufficient documentation

## 2018-04-25 DIAGNOSIS — E119 Type 2 diabetes mellitus without complications: Secondary | ICD-10-CM | POA: Diagnosis not present

## 2018-04-25 DIAGNOSIS — Z79899 Other long term (current) drug therapy: Secondary | ICD-10-CM | POA: Diagnosis not present

## 2018-04-25 LAB — COMPREHENSIVE METABOLIC PANEL
ALK PHOS: 112 U/L (ref 38–126)
ALT: 23 U/L (ref 0–44)
AST: 27 U/L (ref 15–41)
Albumin: 3.1 g/dL — ABNORMAL LOW (ref 3.5–5.0)
Anion gap: 10 (ref 5–15)
BILIRUBIN TOTAL: 0.3 mg/dL (ref 0.3–1.2)
BUN: 11 mg/dL (ref 8–23)
CALCIUM: 8.7 mg/dL — AB (ref 8.9–10.3)
CO2: 22 mmol/L (ref 22–32)
Chloride: 108 mmol/L (ref 98–111)
Creatinine, Ser: 0.89 mg/dL (ref 0.44–1.00)
Glucose, Bld: 121 mg/dL — ABNORMAL HIGH (ref 70–99)
Potassium: 3.2 mmol/L — ABNORMAL LOW (ref 3.5–5.1)
Sodium: 140 mmol/L (ref 135–145)
TOTAL PROTEIN: 7.3 g/dL (ref 6.5–8.1)

## 2018-04-25 LAB — CBC
HEMATOCRIT: 27.4 % — AB (ref 36.0–46.0)
Hemoglobin: 8 g/dL — ABNORMAL LOW (ref 12.0–15.0)
MCH: 25.5 pg — AB (ref 26.0–34.0)
MCHC: 29.2 g/dL — ABNORMAL LOW (ref 30.0–36.0)
MCV: 87.3 fL (ref 78.0–100.0)
Platelets: 229 10*3/uL (ref 150–400)
RBC: 3.14 MIL/uL — AB (ref 3.87–5.11)
RDW: 16.7 % — ABNORMAL HIGH (ref 11.5–15.5)
WBC: 3.3 10*3/uL — AB (ref 4.0–10.5)

## 2018-04-25 LAB — APTT: aPTT: 30 seconds (ref 24–36)

## 2018-04-25 LAB — PROTIME-INR
INR: 1.15
PROTHROMBIN TIME: 14.6 s (ref 11.4–15.2)

## 2018-04-25 LAB — GLUCOSE, CAPILLARY: Glucose-Capillary: 122 mg/dL — ABNORMAL HIGH (ref 70–99)

## 2018-04-25 NOTE — Progress Notes (Signed)
PCP - Dr. Arnette Norris Cardiologist - denies  Chest x-ray - 04/25/18 EKG - 04/25/18 Stress Test - denies ECHO - denies Cardiac Cath - denies  Sleep Study - denies  Pt does not check her CBG at home nor does she have the supplies to do so. She just gets her A1C check at her PCP office and takes oral antidiabetic meds. Last A1C was 03/20/18-7.8.  CBG at PAT appointment 122.  Blood Thinner Instructions: N/A Aspirin Instructions: N/A  Anesthesia review: No   Patient denies shortness of breath, fever, cough and chest pain at PAT appointment   Patient verbalized understanding of instructions that were given to them at the PAT appointment. Patient was also instructed that they will need to review over the PAT instructions again at home before surgery.

## 2018-04-26 NOTE — Anesthesia Preprocedure Evaluation (Addendum)
Anesthesia Evaluation  Patient identified by MRN, date of birth, ID band Patient awake    Reviewed: Allergy & Precautions, NPO status , Patient's Chart, lab work & pertinent test results  Airway Mallampati: II  TM Distance: >3 FB Neck ROM: Full    Dental no notable dental hx. (+) Edentulous Upper, Poor Dentition, Dental Advisory Given,    Pulmonary former smoker,    Pulmonary exam normal breath sounds clear to auscultation       Cardiovascular Exercise Tolerance: Good hypertension, Pt. on home beta blockers Normal cardiovascular exam Rhythm:Regular Rate:Normal     Neuro/Psych negative neurological ROS  negative psych ROS   GI/Hepatic GERD  ,  Endo/Other  diabetes  Renal/GU      Musculoskeletal negative musculoskeletal ROS (+)   Abdominal   Peds  Hematology  (+) anemia ,   Anesthesia Other Findings   Reproductive/Obstetrics                            Anesthesia Physical Anesthesia Plan  ASA: III  Anesthesia Plan: General   Post-op Pain Management:    Induction: Intravenous  PONV Risk Score and Plan: 3 and Treatment may vary due to age or medical condition, Ondansetron and Dexamethasone  Airway Management Planned: Oral ETT  Additional Equipment: Arterial line  Intra-op Plan:   Post-operative Plan: Extubation in OR  Informed Consent: I have reviewed the patients History and Physical, chart, labs and discussed the procedure including the risks, benefits and alternatives for the proposed anesthesia with the patient or authorized representative who has indicated his/her understanding and acceptance.   Dental advisory given  Plan Discussed with: CRNA  Anesthesia Plan Comments: (Large IV access)       Anesthesia Quick Evaluation

## 2018-04-27 ENCOUNTER — Ambulatory Visit (HOSPITAL_COMMUNITY): Payer: Medicare Other | Admitting: Physician Assistant

## 2018-04-27 ENCOUNTER — Ambulatory Visit (HOSPITAL_COMMUNITY)
Admission: RE | Admit: 2018-04-27 | Discharge: 2018-04-27 | Disposition: A | Discharge status: Home / Self Care | Payer: Medicare Other | Source: Ambulatory Visit | Attending: Thoracic Surgery (Cardiothoracic Vascular Surgery) | Admitting: Thoracic Surgery (Cardiothoracic Vascular Surgery)

## 2018-04-27 ENCOUNTER — Encounter (HOSPITAL_COMMUNITY): Payer: Self-pay

## 2018-04-27 ENCOUNTER — Other Ambulatory Visit: Payer: Self-pay

## 2018-04-27 ENCOUNTER — Ambulatory Visit (HOSPITAL_COMMUNITY): Payer: Medicare Other

## 2018-04-27 ENCOUNTER — Encounter (HOSPITAL_COMMUNITY)
Admission: RE | Disposition: A | Discharge status: Home / Self Care | Payer: Self-pay | Source: Ambulatory Visit | Attending: Thoracic Surgery (Cardiothoracic Vascular Surgery)

## 2018-04-27 ENCOUNTER — Ambulatory Visit (HOSPITAL_COMMUNITY): Payer: Medicare Other | Admitting: Anesthesiology

## 2018-04-27 DIAGNOSIS — E119 Type 2 diabetes mellitus without complications: Secondary | ICD-10-CM | POA: Diagnosis not present

## 2018-04-27 DIAGNOSIS — D509 Iron deficiency anemia, unspecified: Secondary | ICD-10-CM | POA: Insufficient documentation

## 2018-04-27 DIAGNOSIS — Z7984 Long term (current) use of oral hypoglycemic drugs: Secondary | ICD-10-CM | POA: Insufficient documentation

## 2018-04-27 DIAGNOSIS — I10 Essential (primary) hypertension: Secondary | ICD-10-CM | POA: Insufficient documentation

## 2018-04-27 DIAGNOSIS — K219 Gastro-esophageal reflux disease without esophagitis: Secondary | ICD-10-CM | POA: Diagnosis not present

## 2018-04-27 DIAGNOSIS — Z79899 Other long term (current) drug therapy: Secondary | ICD-10-CM | POA: Diagnosis not present

## 2018-04-27 DIAGNOSIS — C342 Malignant neoplasm of middle lobe, bronchus or lung: Secondary | ICD-10-CM | POA: Insufficient documentation

## 2018-04-27 DIAGNOSIS — R590 Localized enlarged lymph nodes: Secondary | ICD-10-CM | POA: Diagnosis not present

## 2018-04-27 DIAGNOSIS — Z87891 Personal history of nicotine dependence: Secondary | ICD-10-CM | POA: Diagnosis not present

## 2018-04-27 DIAGNOSIS — R918 Other nonspecific abnormal finding of lung field: Secondary | ICD-10-CM

## 2018-04-27 DIAGNOSIS — R599 Enlarged lymph nodes, unspecified: Secondary | ICD-10-CM | POA: Diagnosis not present

## 2018-04-27 DIAGNOSIS — Z9889 Other specified postprocedural states: Secondary | ICD-10-CM

## 2018-04-27 HISTORY — PX: VIDEO BRONCHOSCOPY WITH ENDOBRONCHIAL ULTRASOUND: SHX6177

## 2018-04-27 LAB — GLUCOSE, CAPILLARY
GLUCOSE-CAPILLARY: 141 mg/dL — AB (ref 70–99)
Glucose-Capillary: 178 mg/dL — ABNORMAL HIGH (ref 70–99)

## 2018-04-27 SURGERY — BRONCHOSCOPY, WITH EBUS
Anesthesia: General

## 2018-04-27 MED ORDER — PROPOFOL 10 MG/ML IV BOLUS
INTRAVENOUS | Status: DC | PRN
  Administered 2018-04-27: 120 mg via INTRAVENOUS

## 2018-04-27 MED ORDER — FENTANYL CITRATE (PF) 250 MCG/5ML IJ SOLN
INTRAMUSCULAR | Status: AC
  Filled 2018-04-27: qty 5

## 2018-04-27 MED ORDER — LIDOCAINE 2% (20 MG/ML) 5 ML SYRINGE
INTRAMUSCULAR | Status: AC
  Filled 2018-04-27: qty 5

## 2018-04-27 MED ORDER — ACETAMINOPHEN 10 MG/ML IV SOLN: 1000.0000 mg | Freq: Once | INTRAVENOUS | Status: DC | PRN

## 2018-04-27 MED ORDER — DEXAMETHASONE SODIUM PHOSPHATE 10 MG/ML IJ SOLN
INTRAMUSCULAR | Status: AC
  Filled 2018-04-27: qty 1

## 2018-04-27 MED ORDER — FENTANYL CITRATE (PF) 100 MCG/2ML IJ SOLN
INTRAMUSCULAR | Status: DC | PRN
  Administered 2018-04-27: 100 ug via INTRAVENOUS

## 2018-04-27 MED ORDER — LACTATED RINGERS IV SOLN
INTRAVENOUS | Status: DC
  Administered 2018-04-27: 08:00:00 via INTRAVENOUS

## 2018-04-27 MED ORDER — ONDANSETRON HCL 4 MG/2ML IJ SOLN
INTRAMUSCULAR | Status: DC | PRN
  Administered 2018-04-27: 4 mg via INTRAVENOUS

## 2018-04-27 MED ORDER — MEPERIDINE HCL 50 MG/ML IJ SOLN: 6.2500 mg | INTRAMUSCULAR | Status: DC | PRN

## 2018-04-27 MED ORDER — ROCURONIUM BROMIDE 10 MG/ML (PF) SYRINGE
PREFILLED_SYRINGE | INTRAVENOUS | Status: DC | PRN
  Administered 2018-04-27: 50 mg via INTRAVENOUS
  Administered 2018-04-27: 10 mg via INTRAVENOUS

## 2018-04-27 MED ORDER — PROMETHAZINE HCL 25 MG/ML IJ SOLN: 6.2500 mg | INTRAMUSCULAR | Status: DC | PRN

## 2018-04-27 MED ORDER — MIDAZOLAM HCL 2 MG/2ML IJ SOLN
INTRAMUSCULAR | Status: AC
  Filled 2018-04-27: qty 2

## 2018-04-27 MED ORDER — LIDOCAINE 2% (20 MG/ML) 5 ML SYRINGE
INTRAMUSCULAR | Status: DC | PRN
  Administered 2018-04-27: 100 mg via INTRAVENOUS

## 2018-04-27 MED ORDER — ROCURONIUM BROMIDE 50 MG/5ML IV SOSY
PREFILLED_SYRINGE | INTRAVENOUS | Status: AC
  Filled 2018-04-27: qty 5

## 2018-04-27 MED ORDER — HYDROMORPHONE HCL 1 MG/ML IJ SOLN: 0.2500 mg | INTRAMUSCULAR | Status: DC | PRN

## 2018-04-27 MED ORDER — EPINEPHRINE PF 1 MG/ML IJ SOLN
INTRAMUSCULAR | Status: AC
  Filled 2018-04-27: qty 1

## 2018-04-27 MED ORDER — PROPOFOL 10 MG/ML IV BOLUS
INTRAVENOUS | Status: AC
  Filled 2018-04-27: qty 20

## 2018-04-27 MED ORDER — 0.9 % SODIUM CHLORIDE (POUR BTL) OPTIME
TOPICAL | Status: DC | PRN
  Administered 2018-04-27: 1000 mL

## 2018-04-27 MED ORDER — SUGAMMADEX SODIUM 200 MG/2ML IV SOLN
INTRAVENOUS | Status: DC | PRN
  Administered 2018-04-27: 160 mg via INTRAVENOUS

## 2018-04-27 MED ORDER — HYDROCODONE-ACETAMINOPHEN 7.5-325 MG PO TABS: 1.0000 | ORAL_TABLET | Freq: Once | ORAL | Status: DC | PRN

## 2018-04-27 MED ORDER — EPINEPHRINE PF 1 MG/ML IJ SOLN
INTRAMUSCULAR | Status: DC | PRN
  Administered 2018-04-27: 1 mg

## 2018-04-27 MED ORDER — DEXAMETHASONE SODIUM PHOSPHATE 10 MG/ML IJ SOLN
INTRAMUSCULAR | Status: DC | PRN
  Administered 2018-04-27: 10 mg via INTRAVENOUS

## 2018-04-27 MED ORDER — ONDANSETRON HCL 4 MG/2ML IJ SOLN
INTRAMUSCULAR | Status: AC
  Filled 2018-04-27: qty 2

## 2018-04-27 SURGICAL SUPPLY — 34 items
BRUSH CYTOL CELLEBRITY 1.5X140 (MISCELLANEOUS) ×2 IMPLANT
CANISTER SUCT 3000ML PPV (MISCELLANEOUS) ×3 IMPLANT
CONT SPEC 4OZ CLIKSEAL STRL BL (MISCELLANEOUS) ×3 IMPLANT
COVER BACK TABLE 60X90IN (DRAPES) ×3 IMPLANT
COVER DOME SNAP 22 D (MISCELLANEOUS) ×1 IMPLANT
FILTER STRAW FLUID ASPIR (MISCELLANEOUS) ×3 IMPLANT
FORCEPS BIOP RJ4 1.8 (CUTTING FORCEPS) ×2 IMPLANT
FORCEPS RADIAL JAW LRG 4 PULM (INSTRUMENTS) IMPLANT
GAUZE SPONGE 4X4 12PLY STRL (GAUZE/BANDAGES/DRESSINGS) IMPLANT
GLOVE SURG SIGNA 7.5 PF LTX (GLOVE) ×3 IMPLANT
GOWN STRL REUS W/ TWL XL LVL3 (GOWN DISPOSABLE) ×1 IMPLANT
GOWN STRL REUS W/TWL XL LVL3 (GOWN DISPOSABLE) ×3
KIT CLEAN ENDO COMPLIANCE (KITS) ×6 IMPLANT
KIT TURNOVER KIT B (KITS) ×3 IMPLANT
MARKER SKIN DUAL TIP RULER LAB (MISCELLANEOUS) ×3 IMPLANT
NDL BLUNT 18X1 FOR OR ONLY (NEEDLE) IMPLANT
NEEDLE BLUNT 18X1 FOR OR ONLY (NEEDLE) IMPLANT
NEEDLE ECHOTIP HI DEF 22GA (NEEDLE) IMPLANT
NS IRRIG 1000ML POUR BTL (IV SOLUTION) ×3 IMPLANT
OIL SILICONE PENTAX (PARTS (SERVICE/REPAIRS)) ×3 IMPLANT
PAD ARMBOARD 7.5X6 YLW CONV (MISCELLANEOUS) ×6 IMPLANT
RADIAL JAW LRG 4 PULMONARY (INSTRUMENTS)
SYR 20CC LL (SYRINGE) ×3 IMPLANT
SYR 20ML ECCENTRIC (SYRINGE) ×6 IMPLANT
SYR 3ML LL SCALE MARK (SYRINGE) IMPLANT
SYR 5ML LL (SYRINGE) ×3 IMPLANT
SYR 5ML LUER SLIP (SYRINGE) ×3 IMPLANT
TOWEL GREEN STERILE (TOWEL DISPOSABLE) ×3 IMPLANT
TOWEL GREEN STERILE FF (TOWEL DISPOSABLE) ×3 IMPLANT
TRAP SPECIMEN MUCOUS 40CC (MISCELLANEOUS) ×3 IMPLANT
TUBE CONNECTING 20'X1/4 (TUBING) ×1
TUBE CONNECTING 20X1/4 (TUBING) ×2 IMPLANT
VALVE DISPOSABLE (MISCELLANEOUS) ×3 IMPLANT
WATER STERILE IRR 1000ML POUR (IV SOLUTION) ×3 IMPLANT

## 2018-04-27 NOTE — Progress Notes (Signed)
Dr. Roxan Hockey made aware of CXR results and verbalized it was okay for patient to be discharged at this time.

## 2018-04-27 NOTE — Anesthesia Procedure Notes (Signed)
Procedure Name: Intubation Date/Time: 04/27/2018 8:13 AM Performed by: Leonor Liv, CRNA Pre-anesthesia Checklist: Patient identified, Emergency Drugs available, Suction available and Patient being monitored Patient Re-evaluated:Patient Re-evaluated prior to induction Oxygen Delivery Method: Circle System Utilized Preoxygenation: Pre-oxygenation with 100% oxygen Induction Type: IV induction Ventilation: Mask ventilation without difficulty Laryngoscope Size: Mac and 3 Grade View: Grade I Tube type: Oral Tube size: 9.0 mm Number of attempts: 1 Airway Equipment and Method: Stylet and Oral airway Placement Confirmation: ETT inserted through vocal cords under direct vision,  positive ETCO2 and breath sounds checked- equal and bilateral Secured at: 20 cm Tube secured with: Tape Dental Injury: Teeth and Oropharynx as per pre-operative assessment

## 2018-04-27 NOTE — Discharge Instructions (Signed)
Do not drive or engage in heavy physical activity for 24 hours  You may cough up small amounts of blood over the next few days  You may use acetaminophen (Tylenol) if needed for discomfort  My office will contact you with follow up information  Call [phone] if you develop chest pain, shortness of breath, fever > 101 F or cough up more than 2 tablespoons of blood

## 2018-04-27 NOTE — Brief Op Note (Signed)
04/27/2018  10:17 AM  PATIENT:  Jenna [name]  72 y.o. female  PRE-OPERATIVE DIAGNOSIS:  Right middle lobe mass, adenopathy  POST-OPERATIVE DIAGNOSIS:  Right middle lobe mass, adenopathy  PROCEDURE: BRONCHOSCOPY- brushings, transbronchial biopsies ENDOBRONCHIAL ULTRASOUND with mediastinal node needle aspirations  SURGEON:  Surgeon(s) and Role:    Melrose Nakayama, MD - Primary  PHYSICIAN ASSISTANT:   ASSISTANTS: none   ANESTHESIA:   general  EBL:  50 mL   BLOOD ADMINISTERED:none  DRAINS: none   LOCAL MEDICATIONS USED:  NONE  SPECIMEN:  Source of Specimen:  Lymph nodes, RML mass  DISPOSITION OF SPECIMEN:  PATHOLOGY  COUNTS:  NO endoscopic  TOURNIQUET:  * No tourniquets in log *  DICTATION: .Other Dictation: Dictation Number -  PLAN OF CARE: Discharge to home after PACU  PATIENT DISPOSITION:  PACU - hemodynamically stable.   Delay start of Pharmacological VTE agent (>24hrs) due to surgical blood loss or risk of bleeding: not applicable

## 2018-04-27 NOTE — Anesthesia Postprocedure Evaluation (Signed)
Anesthesia Post Note  Patient: Jenna [name]  Procedure(s) Performed: VIDEO BRONCHOSCOPY WITH ENDOBRONCHIAL ULTRASOUND (N/A )     Patient location during evaluation: PACU Anesthesia Type: General Level of consciousness: awake and alert Pain management: pain level controlled Vital Signs Assessment: post-procedure vital signs reviewed and stable Respiratory status: spontaneous breathing, nonlabored ventilation, respiratory function stable and patient connected to nasal cannula oxygen Cardiovascular status: blood pressure returned to baseline and stable Postop Assessment: no apparent nausea or vomiting Anesthetic complications: no    Last Vitals:  Vitals:   04/27/18 1053 04/27/18 1100  BP: (!) 159/55 (!) 162/56  Pulse: 69 74  Resp: 18 17  Temp: 36.6 C   SpO2: 96% 93%    Last Pain:  Vitals:   04/27/18 1100  TempSrc:   PainSc: 0-No pain                 Barnet Glasgow

## 2018-04-27 NOTE — Op Note (Signed)
NAME: Jenna [name], Jenna [name] MEDICAL RECORD [id number] ACCOUNT [id number] DATE OF BIRTH:Oct 16, 1945 FACILITY: [facility] LOCATION: [facility] PHYSICIAN:STEVEN Chaya Jan, MD  OPERATIVE REPORT  DATE OF PROCEDURE:  04/27/2018  PREOPERATIVE DIAGNOSIS:  Right middle lobe mass with mediastinal and hilar adenopathy.  POSTOPERATIVE DIAGNOSIS:  Nonsmall cell carcinoma, right middle lobe, clinical stage IIIA.  PROCEDURE:   1. Bronchoscopy with brushings and transbronchial biopsies and  2. Endobronchial ultrasound with mediastinal lymph node aspiration.  SURGEON:  Modesto Charon, MD  ASSISTANT:  None.  ANESTHESIA:  General.  FINDINGS:  Aspirations of lymph node showed no tumor cells.  Brushings showed malignant cells consistent with nonsmall cell carcinoma.  CLINICAL NOTE:  The patient is a 72 year old woman with a history of tobacco abuse who was being evaluated for a persistent cough.  A chest x-ray showed a probable right middle lobe mass.  CT of the chest showed a 6 cm mass in the right middle lobe with  hilar and mediastinal adenopathy.  A PET CT is scheduled, but has not been done yet.  Clinically, findings were consistent with a T3, N2, stage IIIB lung cancer.  She was advised to undergo bronchoscopy and endobronchial ultrasound for diagnostic and  staging purposes.  The indications, risks, benefits, and alternatives were discussed in detail with the patient.  She understood and accepted the risks and agreed to proceed.  OPERATIVE NOTE:  The patient was brought to the operating room on 04/27/2018.  She had induction of general anesthesia and was intubated.  Sequential compression devices were placed on the calves for DVT prophylaxis.  A timeout was performed.  Flexible  fiberoptic bronchoscopy was performed via the endotracheal tube.  It revealed normal endobronchial anatomy with no endobronchial lesions seen to the level of the subsegmental bronchi.  There was some extrinsic  compression of the middle lobe segmental  bronchi, medial greater than lateral.  The endobronchial ultrasound probe was advanced.  Systematic inspection of the mediastinal lymph node stations was carried out.  There was a high posterior paraesophageal node.  Aspirations were performed of this node.  Each node was aspirated, both  without suction and with suction.  Specimens were placed onto slides and into cytologic preparation fluid for cell block.  The scope then was advanced in the subcarinal area and a large node there also was aspirated.  There was a moderately enlarged right paratracheal level 4R node  that was aspirated, which was more anterior. Finally, an 11R hilar node was aspirated.  The endobronchial ultrasound probe was removed.  The bronchoscope was reinserted and was directed to the right middle lobe orifice.  Brushings and transbronchial biopsies were obtained from both the medial and lateral segmental bronchus.  There was bleeding with biopsies and dilute epinephrine was used  to help clear the bleeding and also improve the hemostasis.  The lymph node aspiration showed no tumor.  There were some cells suspicious for nonsmall cell carcinoma on the brushings.  Definitive diagnosis will await final pathology.  A few additional  biopsies were taken to provide extra tissue for examination.  Final inspection was made for hemostasis.  The patient was then extubated in the operating room and taken to the [facility] in good condition.  TN/NUANCE  D:04/27/2018 T:04/27/2018 JOB:[id number]

## 2018-04-27 NOTE — Interval H&P Note (Signed)
History and Physical Interval Note:  04/27/2018 7:59 AM  Jenna [name]  has presented today for surgery, with the diagnosis of Right middle lobe mass, adenopathy  The various methods of treatment have been discussed with the patient and family. After consideration of risks, benefits and other options for treatment, the patient has consented to  Procedure(s): [place] (N/A) as a surgical intervention .  The patient's history has been reviewed, patient examined, no change in status, stable for surgery.  I have reviewed the patient's chart and labs.  Questions were answered to the patient's satisfaction.     Melrose Nakayama

## 2018-04-27 NOTE — Transfer of Care (Signed)
Immediate Anesthesia Transfer of Care Note  Patient: Jenna [name]  Procedure(s) Performed: VIDEO BRONCHOSCOPY WITH ENDOBRONCHIAL ULTRASOUND (N/A )  Patient Location: PACU  Anesthesia Type:General  Level of Consciousness: awake, alert  and oriented  Airway & Oxygen Therapy: Patient Spontanous Breathing and Patient connected to nasal cannula oxygen  Post-op Assessment: Report given to RN, Post -op Vital signs reviewed and stable and Patient moving all extremities  Post vital signs: Reviewed and stable  Last Vitals:  Vitals Value Taken Time  BP 146/117 04/27/2018  9:59 AM  Temp    Pulse 77 04/27/2018 10:01 AM  Resp 19 04/27/2018 10:01 AM  SpO2 91 % 04/27/2018 10:01 AM  Vitals shown include unvalidated device data.  Last Pain:  Vitals:   04/27/18 0657  TempSrc:   PainSc: 0-No pain         Complications: No apparent anesthesia complications

## 2018-04-28 ENCOUNTER — Ambulatory Visit
Admission: RE | Admit: 2018-04-28 | Discharge: 2018-04-28 | Disposition: A | Discharge status: Home / Self Care | Payer: Medicare Other | Source: Ambulatory Visit | Attending: Thoracic Surgery (Cardiothoracic Vascular Surgery) | Admitting: Thoracic Surgery (Cardiothoracic Vascular Surgery)

## 2018-04-28 ENCOUNTER — Encounter (HOSPITAL_COMMUNITY): Payer: Self-pay | Admitting: Thoracic Surgery (Cardiothoracic Vascular Surgery)

## 2018-04-28 ENCOUNTER — Ambulatory Visit (HOSPITAL_COMMUNITY)
Admission: RE | Admit: 2018-04-28 | Discharge: 2018-04-28 | Disposition: A | Discharge status: Home / Self Care | Payer: Medicare Other | Source: Ambulatory Visit | Attending: Thoracic Surgery (Cardiothoracic Vascular Surgery) | Admitting: Thoracic Surgery (Cardiothoracic Vascular Surgery)

## 2018-04-28 DIAGNOSIS — I700 Atherosclerosis of aorta: Secondary | ICD-10-CM | POA: Diagnosis not present

## 2018-04-28 DIAGNOSIS — J90 Pleural effusion, not elsewhere classified: Secondary | ICD-10-CM | POA: Diagnosis not present

## 2018-04-28 DIAGNOSIS — R590 Localized enlarged lymph nodes: Secondary | ICD-10-CM | POA: Diagnosis not present

## 2018-04-28 DIAGNOSIS — R911 Solitary pulmonary nodule: Secondary | ICD-10-CM | POA: Diagnosis not present

## 2018-04-28 DIAGNOSIS — J439 Emphysema, unspecified: Secondary | ICD-10-CM | POA: Diagnosis not present

## 2018-04-28 DIAGNOSIS — R918 Other nonspecific abnormal finding of lung field: Secondary | ICD-10-CM | POA: Diagnosis not present

## 2018-04-28 LAB — ACID FAST SMEAR (AFB, MYCOBACTERIA)

## 2018-04-28 LAB — GLUCOSE, CAPILLARY: GLUCOSE-CAPILLARY: 183 mg/dL — AB (ref 70–99)

## 2018-04-28 LAB — ACID FAST SMEAR (AFB): ACID FAST SMEAR - AFSCU2: NEGATIVE

## 2018-04-28 MED ORDER — FLUDEOXYGLUCOSE F - 18 (FDG) INJECTION
8.8000 | Freq: Once | INTRAVENOUS | Status: AC
  Administered 2018-04-28: 8.8 via INTRAVENOUS

## 2018-05-02 ENCOUNTER — Telehealth: Payer: Self-pay | Admitting: *Deleted

## 2018-05-02 ENCOUNTER — Encounter: Payer: Self-pay | Admitting: Thoracic Surgery (Cardiothoracic Vascular Surgery)

## 2018-05-02 ENCOUNTER — Other Ambulatory Visit: Payer: Self-pay | Admitting: *Deleted

## 2018-05-02 ENCOUNTER — Ambulatory Visit (INDEPENDENT_AMBULATORY_CARE_PROVIDER_SITE_OTHER): Payer: Medicare Other | Admitting: Thoracic Surgery (Cardiothoracic Vascular Surgery)

## 2018-05-02 ENCOUNTER — Other Ambulatory Visit: Payer: Self-pay

## 2018-05-02 VITALS — BP 148/62 | HR 86 | Resp 16 | Ht 65.0 in | Wt 170.0 lb

## 2018-05-02 DIAGNOSIS — Z9889 Other specified postprocedural states: Secondary | ICD-10-CM | POA: Diagnosis not present

## 2018-05-02 DIAGNOSIS — R918 Other nonspecific abnormal finding of lung field: Secondary | ICD-10-CM

## 2018-05-02 LAB — AEROBIC/ANAEROBIC CULTURE W GRAM STAIN (SURGICAL/DEEP WOUND): Culture: NO GROWTH

## 2018-05-02 MED ORDER — ALPRAZOLAM 0.25 MG PO TABS: 0.2500 mg | ORAL_TABLET | Freq: Three times a day (TID) | ORAL | 0 refills | Status: DC | PRN

## 2018-05-02 NOTE — Telephone Encounter (Signed)
Oncology Nurse Navigator Documentation  Oncology Nurse Navigator Flowsheets 05/02/2018  Navigator Location [facility]  Referral date to RadOnc/MedOnc 05/02/2018  Navigator Encounter Type Telephone/I received referral today from Dr. Leonarda Salon office.  I updated Dr. Julien Nordmann and he will see her tomorrow at clinic.  I called and updated patient on appt. She verbalized understanding of appt time and place.   Telephone Outgoing Call  Treatment Phase Pre-Tx/Tx Discussion  Barriers/Navigation Needs Education;Coordination of Care  Education Other  Interventions Coordination of Care;Education  Coordination of Care Appts  Education Method Verbal  Acuity Level 2  Time Spent with Patient 23

## 2018-05-02 NOTE — Progress Notes (Signed)
[address]       [place] [address]             [phone]     HPI: [name] returns to discuss the results of her bronchoscopy.  Jenna [name] is a 72 year old woman who presented with a persistent cough.  She was found to have a right middle lobe mass.  On PET CT the mass was markedly hypermetabolic as were hilar and mediastinal lymph nodes.  I did bronchoscopy and endobronchial ultrasound last week.  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  Aspirations of the nodes were negative.  She was unable to do an MRI of the brain due to anxiety and claustrophobia.  She coughed up blood for a couple of days afterwards only small specks of blood were seen.  That has resolved.   Past Medical History:  Diagnosis Date  . DM (diabetes mellitus) ([place])   . GERD (gastroesophageal reflux disease)   . Hypertension   . Iron deficiency anemia     Current Outpatient Medications  Medication Sig Dispense Refill  . dexlansoprazole (DEXILANT) 60 MG capsule 1 BY MOUTH 30 MIN BEFORE BREAKFAST DAILY 30 capsule 11  . diphenhydrAMINE (BENADRYL) 2 % cream Apply 1 application topically as needed for itching.    . docusate sodium (COLACE) 100 MG capsule Take 100 mg by mouth daily as needed for mild constipation.    . ferrous sulfate 325 (65 FE) MG tablet Take 325 mg by mouth 3 (three) times daily with meals.     [name] glipiZIDE (GLUCOTROL) 10 MG tablet Take 1 tablet (10 mg total) by mouth 2 (two) times daily before a meal. 60 tablet 3  . labetalol (NORMODYNE) 100 MG tablet Take 1 tablet (100 mg total) by mouth 2 (two) times daily. 180 tablet 0  . Multiple Vitamin (MULTIVITAMIN WITH MINERALS) TABS tablet Take 1 tablet by mouth at bedtime.     . simvastatin (ZOCOR) 20 MG tablet Take 20 mg by mouth every evening.    [name] ALPRAZolam (XANAX) 0.25 MG tablet Take 1 tablet (0.25 mg total) by mouth 3 (three) times daily as needed for anxiety. 5 tablet 0   No  current facility-administered medications for this visit.     Physical Exam BP (!) 148/62 (BP Location: Right Arm, Patient Position: Sitting, Cuff Size: Large)   Pulse 86   Resp 16   Ht 5\' 5"  (1.651 m)   Wt 170 lb (77.1 kg)   SpO2 96% Comment: RA  BMI 28.49 kg/m  72 year old woman in no acute distress Alert and oriented x3 with no focal deficits Cardiac regular rate and rhythm Lungs clear  Impression: Jenna [name] is a 72 year old woman with a history of tobacco abuse, type 2 diabetes, reflux, hypertension, and iron deficiency anemia.  She presented with a persistent cough.  Work-up revealed a right middle lobe mass with hilar and mediastinal adenopathy.  I did bronchoscopy and endobronchial ultrasound last week.  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  The aspirations of the 4R, 7, and posterior paratracheal nodes were negative for tumor.  I suspect she still has at least N1 disease.  Based on her CT scan I suspect that she would require at least a bilobectomy and probably a pneumonectomy for resection.  Not sure she is a candidate for that.  She might benefit from chemotherapy and radiation and then if she has a  good response with resection afterwards.  We will check pulmonary function testing  PRN Xanax for MRI of the brain to complete staging.  Will arrange appointment in our multidisciplinary oncology clinic this week or next to meet with oncology and radiation oncology.    Melrose Nakayama, MD [facility] [phone]

## 2018-05-03 ENCOUNTER — Ambulatory Visit
Admission: RE | Admit: 2018-05-03 | Discharge: 2018-05-03 | Disposition: A | Discharge status: Home / Self Care | Payer: Medicare Other | Source: Ambulatory Visit | Attending: Family Medicine | Admitting: Family Medicine

## 2018-05-03 DIAGNOSIS — Z1231 Encounter for screening mammogram for malignant neoplasm of breast: Secondary | ICD-10-CM | POA: Diagnosis not present

## 2018-05-03 DIAGNOSIS — Z1239 Encounter for other screening for malignant neoplasm of breast: Secondary | ICD-10-CM

## 2018-05-04 ENCOUNTER — Ambulatory Visit
Admission: RE | Admit: 2018-05-04 | Discharge: 2018-05-04 | Disposition: A | Discharge status: Home / Self Care | Payer: Medicare Other | Source: Ambulatory Visit | Attending: Radiation Oncology | Admitting: Radiation Oncology

## 2018-05-04 ENCOUNTER — Inpatient Hospital Stay: Payer: Medicare Other | Attending: Internal Medicine | Admitting: Internal Medicine

## 2018-05-04 ENCOUNTER — Other Ambulatory Visit: Payer: Self-pay | Admitting: *Deleted

## 2018-05-04 ENCOUNTER — Encounter: Payer: Self-pay | Admitting: *Deleted

## 2018-05-04 ENCOUNTER — Inpatient Hospital Stay: Payer: Medicare Other

## 2018-05-04 ENCOUNTER — Encounter: Payer: Self-pay | Admitting: Internal Medicine

## 2018-05-04 ENCOUNTER — Telehealth: Payer: Self-pay | Admitting: Internal Medicine

## 2018-05-04 VITALS — BP 165/75 | HR 83 | Temp 98.4°F | Resp 18 | Ht 65.0 in | Wt 176.2 lb

## 2018-05-04 DIAGNOSIS — J918 Pleural effusion in other conditions classified elsewhere: Secondary | ICD-10-CM | POA: Diagnosis not present

## 2018-05-04 DIAGNOSIS — R0609 Other forms of dyspnea: Secondary | ICD-10-CM | POA: Insufficient documentation

## 2018-05-04 DIAGNOSIS — R599 Enlarged lymph nodes, unspecified: Secondary | ICD-10-CM | POA: Insufficient documentation

## 2018-05-04 DIAGNOSIS — G4700 Insomnia, unspecified: Secondary | ICD-10-CM | POA: Insufficient documentation

## 2018-05-04 DIAGNOSIS — Z5111 Encounter for antineoplastic chemotherapy: Secondary | ICD-10-CM | POA: Insufficient documentation

## 2018-05-04 DIAGNOSIS — K5900 Constipation, unspecified: Secondary | ICD-10-CM | POA: Diagnosis not present

## 2018-05-04 DIAGNOSIS — Z87891 Personal history of nicotine dependence: Secondary | ICD-10-CM

## 2018-05-04 DIAGNOSIS — Z7189 Other specified counseling: Secondary | ICD-10-CM | POA: Insufficient documentation

## 2018-05-04 DIAGNOSIS — R2242 Localized swelling, mass and lump, left lower limb: Secondary | ICD-10-CM | POA: Diagnosis not present

## 2018-05-04 DIAGNOSIS — R05 Cough: Secondary | ICD-10-CM | POA: Insufficient documentation

## 2018-05-04 DIAGNOSIS — K219 Gastro-esophageal reflux disease without esophagitis: Secondary | ICD-10-CM | POA: Insufficient documentation

## 2018-05-04 DIAGNOSIS — E119 Type 2 diabetes mellitus without complications: Secondary | ICD-10-CM | POA: Insufficient documentation

## 2018-05-04 DIAGNOSIS — M7989 Other specified soft tissue disorders: Secondary | ICD-10-CM | POA: Diagnosis not present

## 2018-05-04 DIAGNOSIS — I10 Essential (primary) hypertension: Secondary | ICD-10-CM | POA: Diagnosis not present

## 2018-05-04 DIAGNOSIS — R918 Other nonspecific abnormal finding of lung field: Secondary | ICD-10-CM

## 2018-05-04 DIAGNOSIS — Z800 Family history of malignant neoplasm of digestive organs: Secondary | ICD-10-CM | POA: Insufficient documentation

## 2018-05-04 DIAGNOSIS — C342 Malignant neoplasm of middle lobe, bronchus or lung: Secondary | ICD-10-CM

## 2018-05-04 DIAGNOSIS — F17200 Nicotine dependence, unspecified, uncomplicated: Secondary | ICD-10-CM

## 2018-05-04 DIAGNOSIS — Z803 Family history of malignant neoplasm of breast: Secondary | ICD-10-CM | POA: Insufficient documentation

## 2018-05-04 DIAGNOSIS — E785 Hyperlipidemia, unspecified: Secondary | ICD-10-CM | POA: Diagnosis not present

## 2018-05-04 LAB — CBC WITH DIFFERENTIAL (CANCER CENTER ONLY)
Basophils Absolute: 0 10*3/uL (ref 0.0–0.1)
Basophils Relative: 1 %
EOS ABS: 0 10*3/uL (ref 0.0–0.5)
Eosinophils Relative: 1 %
HEMATOCRIT: 28.1 % — AB (ref 34.8–46.6)
HEMOGLOBIN: 8.7 g/dL — AB (ref 11.6–15.9)
LYMPHS ABS: 0.4 10*3/uL — AB (ref 0.9–3.3)
LYMPHS PCT: 10 %
MCH: 25 pg — AB (ref 25.1–34.0)
MCHC: 31 g/dL — ABNORMAL LOW (ref 31.5–36.0)
MCV: 80.7 fL (ref 79.5–101.0)
Monocytes Absolute: 0.4 10*3/uL (ref 0.1–0.9)
Monocytes Relative: 10 %
Neutro Abs: 3.3 10*3/uL (ref 1.5–6.5)
Neutrophils Relative %: 78 %
Platelet Count: 262 10*3/uL (ref 145–400)
RBC: 3.49 MIL/uL — AB (ref 3.70–5.45)
RDW: 17 % — ABNORMAL HIGH (ref 11.2–14.5)
WBC: 4.2 10*3/uL (ref 3.9–10.3)

## 2018-05-04 LAB — CMP (CANCER CENTER ONLY)
ALT: 12 U/L (ref 0–44)
ANION GAP: 7 (ref 5–15)
AST: 26 U/L (ref 15–41)
Albumin: 3 g/dL — ABNORMAL LOW (ref 3.5–5.0)
Alkaline Phosphatase: 123 U/L (ref 38–126)
BUN: 9 mg/dL (ref 8–23)
CHLORIDE: 107 mmol/L (ref 98–111)
CO2: 27 mmol/L (ref 22–32)
CREATININE: 0.91 mg/dL (ref 0.44–1.00)
Calcium: 9 mg/dL (ref 8.9–10.3)
Glucose, Bld: 169 mg/dL — ABNORMAL HIGH (ref 70–99)
POTASSIUM: 3.7 mmol/L (ref 3.5–5.1)
SODIUM: 141 mmol/L (ref 135–145)
Total Bilirubin: 0.3 mg/dL (ref 0.3–1.2)
Total Protein: 7.8 g/dL (ref 6.5–8.1)

## 2018-05-04 MED ORDER — PROCHLORPERAZINE MALEATE 10 MG PO TABS: 10.0000 mg | ORAL_TABLET | Freq: Four times a day (QID) | ORAL | 0 refills | Status: DC | PRN

## 2018-05-04 NOTE — Progress Notes (Signed)
Oncology Nurse Navigator Documentation  Oncology Nurse Navigator Flowsheets 05/04/2018  Navigator Location [facility]  Navigator Encounter Type Clinic/MDC/spoke with patient and family today at cancer center.  Barriers identified Education-information given and explained on DX, TX, and next steps.  Coordination of care-Expidited thoracentesis and gave information to patient on appt time and place. Transportation-gave information on [place] [employer] roads to recovery program.  Her family can get her to appt on Monday 9/23 for her thoracentesis.  I will update CSW on transportation issues.   Abnormal Finding Date 04/06/2018  Confirmed Diagnosis Date 04/27/2018  Multidisiplinary Clinic Date 05/04/2018  Patient Visit Type MedOnc  Treatment Phase Pre-Tx/Tx Discussion  Barriers/Navigation Needs Education;Coordination of Care  Education Understanding Cancer/ Treatment Options;Newly Diagnosed Cancer Education;Other  Interventions Coordination of Care;Education  Coordination of Care Appts  Education Method Verbal;Written  Acuity Level 2  Time Spent with Patient 30

## 2018-05-04 NOTE — Telephone Encounter (Signed)
Scheduled appt per 9/19 los - still need to schedule f/u on 9/30 and chemo edu class. - unable to schedule f/u due to MD  availability. will call patient when appt is scheduled for f/u to also set up chemo edu class.

## 2018-05-04 NOTE — Progress Notes (Signed)
Radiation Oncology         [phone] ________________________________  Name: Jenna [name]        MRN: [id number]  Date of Service: 05/04/2018 DOB: [birth year]  FK:CLEX, Marciano Sequin, MD  Melrose Nakayama, *     REFERRING PHYSICIAN: Melrose Nakayama, *   DIAGNOSIS: The encounter diagnosis was Malignant neoplasm of middle lobe of right lung ([place]).   HISTORY OF PRESENT ILLNESS: Jenna [name] is a 72 y.o. female seen at the request of Dr. Roxan Hockey for a new diagnosis of squamous cell carcinoma.  Patient had symptoms of a cough, and was seen and evaluated and chest x-ray was performed revealing an abnormality in the right lung.  Subsequently she underwent a CT scan on 04/06/2018 which revealed a 6 x 5.5 x 4 cm right middle lobe mass with a few adjacent satellite nodules, in addition there was an enlarged right hilar lymph node measuring 2.5 cm, a 1.1 x 1.1 x 2.2 cm right paraesophageal lymph node/mass, and no evidence of additional abnormalities were noted on that study.  She subsequently was referred to Dr. Roxan Hockey and9/07/2018 underwent bronchoscopy with EBUS.  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  Her 11 R, 4R, 7 and paraesophageal lymph node sampling were performed and revealed lymphoid tissue however they did not reveal evidence of malignancy.  That being said there is been concerned about concordance of these findings, and in re-review of her CT imaging it appears that she did not have an effusion.  However her PET scan on 04/28/18 did show an effusion on the right pleural space which was new but low level activity. In addition to hypermetabolism within the right middle lobe mass with an SUV of 15.7, her right hilar adenopathy had an SUV of 6.3, left hilum SUV of 3.7, subcarinal node of 4.2 SUV, right lower paratracheal SUV of 5.5, and right paraesophageal with an SUV of 5.6.  No  evidence of metastatic disease as noted elsewhere.  She has a brain MRI pending at this time. She comes today to discuss treatment recommendations for her cancer.    PREVIOUS RADIATION THERAPY: No   PAST MEDICAL HISTORY:  Past Medical History:  Diagnosis Date  . DM (diabetes mellitus) ([place])   . GERD (gastroesophageal reflux disease)   . Hypertension   . Iron deficiency anemia        PAST SURGICAL HISTORY: Past Surgical History:  Procedure Laterality Date  . CATARACT EXTRACTION Right   . CHOLECYSTECTOMY    . COLONOSCOPY  10/24/2009   normal rectum/1X1cm abnormal lesion in the ascending colon (bx benign). TI normal for 10cm.  Prep difficult/inadequate. f/u TCS 09/2012 recommended  . COLONOSCOPY  10/13/2004   Normal rectum/Diminutive polyps, splenic flexure, cold biopsied/removed.  Remainder of colonic mucosa appeared normal.  . COLONOSCOPY N/A 12/14/2012   NTZ:GYFVCBS polyp-tubular adenoma  . ESOPHAGOGASTRODUODENOSCOPY  10/13/2004    Normal esophagus/ Nodular volcano like lesion in the antrum, either representing a  pancreatic rest or leiomyoma, biopsied.  Remainder of the gastric mucosa appeared normal, normal D1-D2  . ESOPHAGOGASTRODUODENOSCOPY  10/24/2009   Benign biopsies. normal esophagus/small hiatal hernia/nodular lesion antrum/distal greater curvature. duodenal AVM s/p ablation  . GIVENS CAPSULE STUDY  07/27/2010    multiple arteriovenous malformations which could definitely be the contributor to her drifting hemoglobin and hematocrit  . TUBAL LIGATION    . VIDEO BRONCHOSCOPY WITH ENDOBRONCHIAL ULTRASOUND  N/A 04/27/2018   Procedure: VIDEO BRONCHOSCOPY WITH ENDOBRONCHIAL ULTRASOUND;  Surgeon: Melrose Nakayama, MD;  Location: [facility] OR;  Service: Thoracic;  Laterality: N/A;     FAMILY HISTORY:  Family History  Problem Relation Age of Onset  . Colon cancer Maternal Aunt        greater than age 59  . Breast cancer Cousin   . Amblyopia Neg Hx   . Blindness Neg Hx   .  Cataracts Neg Hx   . Diabetes Neg Hx   . Glaucoma Neg Hx   . Macular degeneration Neg Hx   . Retinal detachment Neg Hx   . Strabismus Neg Hx   . Retinitis pigmentosa Neg Hx      SOCIAL HISTORY:  reports that she quit smoking about 6 weeks ago. Her smoking use included cigarettes. She has a 27.50 pack-year smoking history. She has never used smokeless tobacco. She reports that she does not drink alcohol or use drugs. The patient is single and lives in [place].  ALLERGIES: Aspirin; Esomeprazole magnesium; and Ace inhibitors   MEDICATIONS:  Current Outpatient Medications  Medication Sig Dispense Refill  . ALPRAZolam (XANAX) 0.25 MG tablet Take 1 tablet (0.25 mg total) by mouth 3 (three) times daily as needed for anxiety. 5 tablet 0  . dexlansoprazole (DEXILANT) 60 MG capsule 1 BY MOUTH 30 MIN BEFORE BREAKFAST DAILY 30 capsule 11  . diphenhydrAMINE (BENADRYL) 2 % cream Apply 1 application topically as needed for itching.    . docusate sodium (COLACE) 100 MG capsule Take 100 mg by mouth daily as needed for mild constipation.    . ferrous sulfate 325 (65 FE) MG tablet Take 325 mg by mouth 3 (three) times daily with meals.     [name] glipiZIDE (GLUCOTROL) 10 MG tablet Take 1 tablet (10 mg total) by mouth 2 (two) times daily before a meal. 60 tablet 3  . labetalol (NORMODYNE) 100 MG tablet Take 1 tablet (100 mg total) by mouth 2 (two) times daily. 180 tablet 0  . Multiple Vitamin (MULTIVITAMIN WITH MINERALS) TABS tablet Take 1 tablet by mouth at bedtime.     . prochlorperazine (COMPAZINE) 10 MG tablet Take 1 tablet (10 mg total) by mouth every 6 (six) hours as needed for nausea or vomiting. 30 tablet 0  . simvastatin (ZOCOR) 20 MG tablet Take 20 mg by mouth every evening.     No current facility-administered medications for this encounter.      REVIEW OF SYSTEMS: On review of systems, the patient reports that she is doing well overall. She denies any chest pain, fevers, chills, night sweats,  unintended weight changes. She reports coughing with clear phlegm and no hemoptysis with the exception of the day she had her biopsy. She denies any bowel or bladder disturbances, and denies abdominal pain, nausea or vomiting. She denies any new musculoskeletal or joint aches or pains. She does note edema in the RLE for the last month. She denies any pain in that extremity or increasing swelling. A complete review of systems is obtained and is otherwise negative.     PHYSICAL EXAM:  Wt Readings from Last 3 Encounters:  05/04/18 176 lb 3.2 oz (79.9 kg)  05/02/18 170 lb (77.1 kg)  04/25/18 177 lb 12.8 oz (80.6 kg)   Temp Readings from Last 3 Encounters:  05/04/18 98.4 F (36.9 C) (Oral)  04/27/18 97.8 F (36.6 C)  04/25/18 98.6 F (37 C) (Oral)   BP Readings from Last 3 Encounters:  05/04/18 (!) 165/75  05/02/18 (!) 148/62  04/27/18 (!) 162/56   Pulse Readings from Last 3 Encounters:  05/04/18 83  05/02/18 86  04/27/18 74    In general this is a well appearing African American female in no acute distress. She is alert and oriented x4 and appropriate throughout the examination. HEENT reveals that the patient is normocephalic, atraumatic. EOMs are intact. Skin is intact without any evidence of gross lesions. Cardiovascular exam reveals a regular rate and rhythm, no clicks rubs or murmurs are auscultated. Chest is clear to auscultation bilaterally. Lymphatic assessment is performed and does not reveal any adenopathy in the cervical, supraclavicular, axillary, or inguinal chains. Abdomen has active bowel sounds in all quadrants and is intact. The abdomen is soft, non tender, non distended. Lower extremities are assessed and she has 1-2+ pitting edema of the RLE. There is trace of the LLE. She does not have any deep calf tenderness, cyanosis or clubbing.    ECOG = 1  0 - Asymptomatic (Fully active, able to carry on all predisease activities without restriction)  1 - Symptomatic but  completely ambulatory (Restricted in physically strenuous activity but ambulatory and able to carry out work of a light or sedentary nature. For example, light housework, office work)  2 - Symptomatic, <50% in bed during the day (Ambulatory and capable of all self care but unable to carry out any work activities. Up and about more than 50% of waking hours)  3 - Symptomatic, >50% in bed, but not bedbound (Capable of only limited self-care, confined to bed or chair 50% or more of waking hours)  4 - Bedbound (Completely disabled. Cannot carry on any self-care. Totally confined to bed or chair)  5 - Death   Eustace Pen MM, Creech RH, Tormey DC, et al. [phone]). "Toxicity and response criteria of the [facility] Group". [place] Oncol. 5 (6): 649-55    LABORATORY DATA:  Lab Results  Component Value Date   WBC 4.2 05/04/2018   HGB 8.7 (L) 05/04/2018   HCT 28.1 (L) 05/04/2018   MCV 80.7 05/04/2018   PLT 262 05/04/2018   Lab Results  Component Value Date   NA 141 05/04/2018   K 3.7 05/04/2018   CL 107 05/04/2018   CO2 27 05/04/2018   Lab Results  Component Value Date   ALT 12 05/04/2018   AST 26 05/04/2018   ALKPHOS 123 05/04/2018   BILITOT 0.3 05/04/2018      RADIOGRAPHY: Dg Chest 2 View  Result Date: 04/26/2018 CLINICAL DATA:  Video bronchoscopy EXAM: CHEST - 2 VIEW COMPARISON:  CT 04/06/2018 FINDINGS: Masslike opacity in the right lower lobe as seen on CT. No pneumothorax following bronchoscopy. Heart is upper limits normal in size. No confluent opacity on the left. No effusions. No acute bony abnormality. IMPRESSION: Masslike opacity in the right lower lung. No pneumothorax. Electronically Signed   By: Rolm Baptise M.D.   On: 04/26/2018 09:09   Ct Chest W Contrast  Result Date: 04/06/2018 CLINICAL DATA:  72 year old female with RIGHT lung mass identified on recent chest radiograph. EXAM: CT CHEST WITH CONTRAST TECHNIQUE: Multidetector CT imaging of the chest was  performed during intravenous contrast administration. CONTRAST:  24mL ISOVUE-300 IOPAMIDOL (ISOVUE-300) INJECTION 61% COMPARISON:  03/23/2018 chest radiograph FINDINGS: Cardiovascular: Mild cardiomegaly noted. Coronary and aortic atherosclerotic calcifications identified. No thoracic aortic aneurysm or pericardial effusion. Mediastinum/Nodes: Enlarged RIGHT hilar/infrahilar lymph nodes are noted including a 2.5 cm RIGHT hilar lymph node (series  2: Image 61). A 1.2 x 1.2 x 2.2 cm RIGHT paraesophageal mass/lymph node is identified (2:30 3-41). No other enlarged lymph nodes identified. No significant thyroid abnormalities. Lungs/Pleura: A 6 x 5.5 x 4 cm RIGHT middle lobe mass is identified with a few tiny adjacent satellite nodules, highly suspicious for malignancy. No other suspicious pulmonary abnormalities identified. No pleural effusion or pneumothorax. Upper Abdomen: No acute abnormality. The visualized liver and adrenal glands are unremarkable. No abnormal appearing lymph nodes are identified in the UPPER abdomen. Musculoskeletal: No chest wall abnormality. No acute or significant osseous findings. IMPRESSION: 1. 6 cm RIGHT middle lobe mass with enlarged RIGHT hilar and mediastinal lymph nodes, highly suspicious for primary lung malignancy and lymphatic spread. 2. Mild cardiomegaly and coronary artery disease 3. Aortic Atherosclerosis (ICD10-I70.0). Electronically Signed   By: Margarette Canada M.D.   On: 04/06/2018 15:25   Nm Pet Image Initial (pi) Skull Base To Thigh  Result Date: 04/28/2018 CLINICAL DATA:  Initial treatment strategy for right middle lobe lung mass. EXAM: NUCLEAR MEDICINE PET SKULL BASE TO THIGH TECHNIQUE: 8.8 mCi F-18 FDG was injected intravenously. Full-ring PET imaging was performed from the skull base to thigh after the radiotracer. CT data was obtained and used for attenuation correction and anatomic localization. Fasting blood glucose: 183 mg/dl COMPARISON:  04/06/2018 chest CT. FINDINGS:  Mediastinal blood pool activity: SUV max 2.5 NECK: Mildly enlarged hypermetabolic 1.0 cm lymph node in the right lower neck at the thoracic inlet (series 4/image 41) with max SUV 3.7. No additional pathologically enlarged lymph nodes in the neck. Incidental CT findings: none CHEST: Intensely hypermetabolic 5.5 cm right middle lobe lung mass with max SUV 15.7 (series 4/image 71). There is postobstructive atelectasis throughout the peripheral right middle lobe. Hypermetabolic right hilar adenopathy with max SUV 6.3. Low-level hypermetabolism within left hilar nodes with max SUV 3.7. Mildly hypermetabolic mildly enlarged 1.0 cm subcarinal node with max SUV 4.2 (series 4/image 65). Mildly enlarged hypermetabolic right low paratracheal 1.0 cm node with max SUV 5.5 (series 4/image 61). Enlarged hypermetabolic high right paraesophageal mediastinal 1.5 cm node with max SUV 5.6 (series 4/image 48). No hypermetabolic axillary nodes. Incidental CT findings: Small dependent right pleural effusion. Posterior right upper lobe 5 mm solid pulmonary nodule (series 8/image 20), below PET resolution, new since 04/06/2018 CT. No additional significant pulmonary nodules. Atherosclerotic nonaneurysmal abdominal aorta. Coronary atherosclerosis. Top-normal heart size. Mild centrilobular emphysema. ABDOMEN/PELVIS: No abnormal hypermetabolic activity within the liver, pancreas, adrenal glands, or spleen. No hypermetabolic lymph nodes in the abdomen or pelvis. Incidental CT findings: Cholecystectomy. Atherosclerotic nonaneurysmal abdominal aorta. Mild sigmoid diverticulosis. SKELETON: No focal hypermetabolic activity to suggest skeletal metastasis. Incidental CT findings: none IMPRESSION: 1. Intensely hypermetabolic 5.5 cm right middle lobe lung mass, compatible with primary bronchogenic carcinoma. Right middle lobe postobstructive atelectasis. 2. New 5 mm posterior right upper lobe pulmonary nodule, below PET resolution, recommend attention  on follow-up chest CT in 3 months. 3. Small dependent right pleural effusion. 4. Hypermetabolic ipsilateral and contralateral hilar, subcarinal, right paratracheal, high right paraesophageal and right thoracic inlet adenopathy. 5. Otherwise no hypermetabolic extrathoracic or osseous metastatic disease. 6. Aortic Atherosclerosis (ICD10-I70.0) and Emphysema (ICD10-J43.9). Electronically Signed   By: Ilona Sorrel M.D.   On: 04/28/2018 11:41   Dg Chest Port 1 View  Result Date: 04/27/2018 CLINICAL DATA:  Followup bronchoscopy. EXAM: PORTABLE CHEST 1 VIEW COMPARISON:  04/25/2018 FINDINGS: Heart size is unchanged. Aortic atherosclerosis as seen previously. Interstitial prominence that could reflect mild interstitial edema. Airspace  filling in the right lower lung, slightly worsened, possibly secondary to postprocedure hemorrhage or lavage. No pneumothorax or hemothorax. IMPRESSION: Increased alveolar filling in the right lower lung. This could be due to postprocedure hemorrhage or lavage. Mild interstitial prominence increase which could be due to mild edema. Electronically Signed   By: Nelson Chimes M.D.   On: 04/27/2018 11:02   Mm 3d Screen Breast Bilateral  Result Date: 05/04/2018 CLINICAL DATA:  Screening. EXAM: DIGITAL SCREENING BILATERAL MAMMOGRAM WITH TOMO AND CAD COMPARISON:  Previous exam(s). ACR Breast Density Category b: There are scattered areas of fibroglandular density. FINDINGS: There are no findings suspicious for malignancy. Images were processed with CAD. IMPRESSION: No mammographic evidence of malignancy. A result letter of this screening mammogram will be mailed directly to the patient. RECOMMENDATION: Screening mammogram in one year. (Code:[id number]) BI-RADS CATEGORY  1: Negative. Electronically Signed   By: Ammie Ferrier M.D.   On: 05/04/2018 09:58       IMPRESSION/PLAN: 1. 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 Dr. Lisbeth Renshaw discusses the pathology findings and  reviews the nature of locally advanced lung cancer. He discussed the rationale to rule out metastatic disease with thoracentesis, and if this is negative, confirm this with supraclavicular approach to biopsy the paraesophageal node. She will also need a brain MRI which she is scheduled for on Sunday. She appears though to be a good candidate for chemoRT. We discussed how this is given.  We discussed the risks, benefits, short, and long term effects of radiotherapy, and the patient is interested in proceeding. Dr. Lisbeth Renshaw discusses the delivery and logistics of radiotherapy and anticipates a course of 6 1/2 weeks of radiotherapy. We will see her back about 2 weeks after surgery to discuss the simulation process and anticipate we starting radiotherapy about 4-6 weeks after surgery.  2. RLE edema. The patient's lower extremity is slightly edematous. I've discussed with Dr. Julien Nordmann and we will follow this as it appears to be a chronic issue rather than acute. If she has progressive symptoms, she is to contact us to coordinate further work up, or to be seen in an urgent setting.    The above documentation reflects my direct findings during this shared patient visit. Please see the separate note by Dr. Lisbeth Renshaw on this date for the remainder of the patient's plan of care.    Carola Rhine, PAC

## 2018-05-04 NOTE — Addendum Note (Signed)
Encounter addended by: Valrie Hart, RN on: 05/04/2018 3:31 PM  Actions taken: Visit Navigator Flowsheet section accepted

## 2018-05-04 NOTE — Progress Notes (Signed)
START ON PATHWAY REGIMEN - Non-Small Cell Lung     Administer weekly:     Paclitaxel      Carboplatin   **Always confirm dose/schedule in your pharmacy ordering system**  Patient Characteristics: Stage III - Unresectable, PS = 0, 1 AJCC T Category: T3 Current Disease Status: No Distant Mets or Local Recurrence AJCC N Category: N3 AJCC M Category: M0 AJCC 8 Stage Grouping: IIIC Performance Status: PS = 0, 1 Intent of Therapy: Curative Intent, Discussed with Patient

## 2018-05-04 NOTE — Progress Notes (Signed)
[place] [phone]   [phone] Multidisciplinary thoracic oncology clinic CONSULT NOTE  REFERRING PHYSICIAN: Dr. Modesto Charon  REASON FOR CONSULTATION:  72 years old African-American female recently diagnosed with lung cancer.  HPI Jenna [name] is a 72 y.o. female with past medical history significant for hypertension, diabetes mellitus, GERD, iron deficiency, cataract surgery, dyslipidemia as well as long history of smoking.  The patient mentioned that she has been complaining of nonproductive cough since early 2019.  It was thought initially to be secondary to allergy.  Her cough persisted and the patient established care with a primary care physician Dr. Deborra Medina.  Chest x-ray was performed on 03/24/2018 and that showed right middle lobe density.  This was followed by CT scan of the chest on April 06, 2018 and that showed a 6.0 x 5.5 x 4.0 cm right middle lobe mass with few tiny adjacent satellite nodules, highly suspicious for malignancy.  The scan also showed enlarged right hilar/infrahilar lymph nodes including a 2.5 cm right hilar lymph node, 1.2 x 1.2 x 2.2 cm right paraesophageal mass/lymph node. The patient was referred to Dr. Roxan Hockey and she underwent bronchoscopy with endobronchial ultrasound and biopsies on April 27, 2018 and the final pathology ([id number]) was consistent with a squamous cell carcinoma.  The fine-needle aspiration of the lymph nodes from level 11 R, 4R and paraesophageal lymph nodes were negative for malignancy. The patient had a PET scan on 04/28/2018 and that showed intensely hypermetabolic 5.5 cm right middle lobe lung mass compatible with primary bronchogenic carcinoma.  There was a right middle lobe postobstructive atelectasis.  There was new 0.5 cm posterior right upper lobe pulmonary nodule below PET resolution.  There was a small dependent right pleural effusion.  The PET scan also showed hypermetabolic  ipsilateral and contralateral hilar, subcarinal, right paratracheal, high right paraesophageal and right thoracic inlet adenopathy.  There was no hypermetabolic extrathoracic or osseous metastatic disease. Dr. Roxan Hockey kindly referred the patient to the multidisciplinary thoracic oncology clinic today for evaluation and recommendation regarding treatment of her condition. When seen today the patient is feeling fine except for insomnia and mild swelling of her lower extremities.  She denied having any chest pain but has shortness of breath with exertion and cough productive of clear sputum with no hemoptysis.  She denied having any nausea, vomiting, diarrhea but has intermittent constipation.  The patient denied having any headache or visual changes. Family history significant for mother died from heart attack at age 53, father died from old age at age 13, maternal aunt had colon cancer and causing had breast cancer. The patient is single and has 3 living children and one deceased.  She works as a Training and development officer.  She was accompanied by her friend [name].  The patient has a history of smoking 1 pack/day for around 56 years and quit in August 2019.  She has no history of alcohol or drug abuse. HPI  Past Medical History:  Diagnosis Date  . DM (diabetes mellitus) ([place])   . GERD (gastroesophageal reflux disease)   . Hypertension   . Iron deficiency anemia     Past Surgical History:  Procedure Laterality Date  . CATARACT EXTRACTION Right   . CHOLECYSTECTOMY    . COLONOSCOPY  10/24/2009   normal rectum/1X1cm abnormal lesion in the ascending colon (bx benign). TI normal for 10cm.  Prep difficult/inadequate. f/u TCS 09/2012 recommended  . COLONOSCOPY  10/13/2004   Normal rectum/Diminutive polyps, splenic  flexure, cold biopsied/removed.  Remainder of colonic mucosa appeared normal.  . COLONOSCOPY N/A 12/14/2012   XNT:[id number] polyp-tubular adenoma  . ESOPHAGOGASTRODUODENOSCOPY  10/13/2004    Normal esophagus/  Nodular volcano like lesion in the antrum, either representing a  pancreatic rest or leiomyoma, biopsied.  Remainder of the gastric mucosa appeared normal, normal D1-D2  . ESOPHAGOGASTRODUODENOSCOPY  10/24/2009   Benign biopsies. normal esophagus/small hiatal hernia/nodular lesion antrum/distal greater curvature. duodenal AVM s/p ablation  . GIVENS CAPSULE STUDY  07/27/2010    multiple arteriovenous malformations which could definitely be the contributor to her drifting hemoglobin and hematocrit  . TUBAL LIGATION    . VIDEO BRONCHOSCOPY WITH ENDOBRONCHIAL ULTRASOUND N/A 04/27/2018   Procedure: VIDEO BRONCHOSCOPY WITH ENDOBRONCHIAL ULTRASOUND;  Surgeon: Melrose Nakayama, MD;  Location: [facility] OR;  Service: Thoracic;  Laterality: N/A;    Family History  Problem Relation Age of Onset  . Colon cancer Maternal Aunt        greater than age 72  . Breast cancer Cousin   . Amblyopia Neg Hx   . Blindness Neg Hx   . Cataracts Neg Hx   . Diabetes Neg Hx   . Glaucoma Neg Hx   . Macular degeneration Neg Hx   . Retinal detachment Neg Hx   . Strabismus Neg Hx   . Retinitis pigmentosa Neg Hx     Social History Social History   Tobacco Use  . Smoking status: Former Smoker    Packs/day: 0.50    Years: 55.00    Pack years: 27.50    Types: Cigarettes    Last attempt to quit: 03/18/2018    Years since quitting: 0.1  . Smokeless tobacco: Never Used  . Tobacco comment: smoked off and n  Substance Use Topics  . Alcohol use: No  . Drug use: No    Allergies  Allergen Reactions  . Aspirin Other (See Comments)    Stomach bleeding   . Esomeprazole Magnesium     UNSPECIFIED REACTION   . Ace Inhibitors Other (See Comments)    Dizziness, drunk like    Current Outpatient Medications  Medication Sig Dispense Refill  . ALPRAZolam (XANAX) 0.25 MG tablet Take 1 tablet (0.25 mg total) by mouth 3 (three) times daily as needed for anxiety. 5 tablet 0  . dexlansoprazole (DEXILANT) 60 MG capsule 1 BY  MOUTH 30 MIN BEFORE BREAKFAST DAILY 30 capsule 11  . diphenhydrAMINE (BENADRYL) 2 % cream Apply 1 application topically as needed for itching.    . docusate sodium (COLACE) 100 MG capsule Take 100 mg by mouth daily as needed for mild constipation.    . ferrous sulfate 325 (65 FE) MG tablet Take 325 mg by mouth 3 (three) times daily with meals.     [name] glipiZIDE (GLUCOTROL) 10 MG tablet Take 1 tablet (10 mg total) by mouth 2 (two) times daily before a meal. 60 tablet 3  . labetalol (NORMODYNE) 100 MG tablet Take 1 tablet (100 mg total) by mouth 2 (two) times daily. 180 tablet 0  . Multiple Vitamin (MULTIVITAMIN WITH MINERALS) TABS tablet Take 1 tablet by mouth at bedtime.     . simvastatin (ZOCOR) 20 MG tablet Take 20 mg by mouth every evening.     No current facility-administered medications for this visit.     Review of Systems  Constitutional: negative Eyes: negative Ears, nose, mouth, throat, and face: negative Respiratory: positive for cough, dyspnea on exertion and sputum Cardiovascular: negative Gastrointestinal: negative Genitourinary:negative Integument/breast: negative  Hematologic/lymphatic: negative Musculoskeletal:negative Neurological: negative Behavioral/Psych: negative Endocrine: negative Allergic/Immunologic: negative  Physical Exam  JKD:TOIZT, healthy, no distress, well nourished, well developed and anxious SKIN: skin color, texture, turgor are normal, no rashes or significant lesions HEAD: Normocephalic, No masses, lesions, tenderness or abnormalities EYES: normal, PERRLA, Conjunctiva are pink and non-injected EARS: External ears normal, Canals clear OROPHARYNX:no exudate, no erythema and lips, buccal mucosa, and tongue normal  NECK: supple, no adenopathy, no JVD LYMPH:  no palpable lymphadenopathy, no hepatosplenomegaly BREAST:not examined LUNGS: clear to auscultation , and palpation HEART: regular rate & rhythm, no murmurs and no gallops ABDOMEN:abdomen  soft, non-tender, normal bowel sounds and no masses or organomegaly BACK: Back symmetric, no curvature., No CVA tenderness EXTREMITIES:no joint deformities, effusion, or inflammation, no edema  NEURO: alert & oriented x 3 with fluent speech, no focal motor/sensory deficits  PERFORMANCE STATUS: ECOG 1  LABORATORY DATA: Lab Results  Component Value Date   WBC 4.2 05/04/2018   HGB 8.7 (L) 05/04/2018   HCT 28.1 (L) 05/04/2018   MCV 80.7 05/04/2018   PLT 262 05/04/2018      Chemistry      Component Value Date/Time   NA 140 04/25/2018 1544   K 3.2 (L) 04/25/2018 1544   CL 108 04/25/2018 1544   CO2 22 04/25/2018 1544   BUN 11 04/25/2018 1544   BUN 12 12/10/2011 0919   CREATININE 0.89 04/25/2018 1544   CREATININE 0.72 12/10/2011 0919      Component Value Date/Time   CALCIUM 8.7 (L) 04/25/2018 1544   ALKPHOS 112 04/25/2018 1544   ALKPHOS 88 12/10/2011 0919   AST 27 04/25/2018 1544   AST 19 12/10/2011 0919   ALT 23 04/25/2018 1544   BILITOT 0.3 04/25/2018 1544   BILITOT 0.2 12/10/2011 0919       RADIOGRAPHIC STUDIES: Dg Chest 2 View  Result Date: 04/26/2018 CLINICAL DATA:  Video bronchoscopy EXAM: CHEST - 2 VIEW COMPARISON:  CT 04/06/2018 FINDINGS: Masslike opacity in the right lower lobe as seen on CT. No pneumothorax following bronchoscopy. Heart is upper limits normal in size. No confluent opacity on the left. No effusions. No acute bony abnormality. IMPRESSION: Masslike opacity in the right lower lung. No pneumothorax. Electronically Signed   By: Rolm Baptise M.D.   On: 04/26/2018 09:09   Ct Chest W Contrast  Result Date: 04/06/2018 CLINICAL DATA:  72 year old female with RIGHT lung mass identified on recent chest radiograph. EXAM: CT CHEST WITH CONTRAST TECHNIQUE: Multidetector CT imaging of the chest was performed during intravenous contrast administration. CONTRAST:  26mL ISOVUE-300 IOPAMIDOL (ISOVUE-300) INJECTION 61% COMPARISON:  03/23/2018 chest radiograph FINDINGS:  Cardiovascular: Mild cardiomegaly noted. Coronary and aortic atherosclerotic calcifications identified. No thoracic aortic aneurysm or pericardial effusion. Mediastinum/Nodes: Enlarged RIGHT hilar/infrahilar lymph nodes are noted including a 2.5 cm RIGHT hilar lymph node (series 2: Image 61). A 1.2 x 1.2 x 2.2 cm RIGHT paraesophageal mass/lymph node is identified (2:30 3-41). No other enlarged lymph nodes identified. No significant thyroid abnormalities. Lungs/Pleura: A 6 x 5.5 x 4 cm RIGHT middle lobe mass is identified with a few tiny adjacent satellite nodules, highly suspicious for malignancy. No other suspicious pulmonary abnormalities identified. No pleural effusion or pneumothorax. Upper Abdomen: No acute abnormality. The visualized liver and adrenal glands are unremarkable. No abnormal appearing lymph nodes are identified in the UPPER abdomen. Musculoskeletal: No chest wall abnormality. No acute or significant osseous findings. IMPRESSION: 1. 6 cm RIGHT middle lobe mass with enlarged RIGHT hilar and mediastinal lymph  nodes, highly suspicious for primary lung malignancy and lymphatic spread. 2. Mild cardiomegaly and coronary artery disease 3. Aortic Atherosclerosis (ICD10-I70.0). Electronically Signed   By: Margarette Canada M.D.   On: 04/06/2018 15:25   Nm Pet Image Initial (pi) Skull Base To Thigh  Result Date: 04/28/2018 CLINICAL DATA:  Initial treatment strategy for right middle lobe lung mass. EXAM: NUCLEAR MEDICINE PET SKULL BASE TO THIGH TECHNIQUE: 8.8 mCi F-18 FDG was injected intravenously. Full-ring PET imaging was performed from the skull base to thigh after the radiotracer. CT data was obtained and used for attenuation correction and anatomic localization. Fasting blood glucose: 183 mg/dl COMPARISON:  04/06/2018 chest CT. FINDINGS: Mediastinal blood pool activity: SUV max 2.5 NECK: Mildly enlarged hypermetabolic 1.0 cm lymph node in the right lower neck at the thoracic inlet (series 4/image 41) with  max SUV 3.7. No additional pathologically enlarged lymph nodes in the neck. Incidental CT findings: none CHEST: Intensely hypermetabolic 5.5 cm right middle lobe lung mass with max SUV 15.7 (series 4/image 71). There is postobstructive atelectasis throughout the peripheral right middle lobe. Hypermetabolic right hilar adenopathy with max SUV 6.3. Low-level hypermetabolism within left hilar nodes with max SUV 3.7. Mildly hypermetabolic mildly enlarged 1.0 cm subcarinal node with max SUV 4.2 (series 4/image 65). Mildly enlarged hypermetabolic right low paratracheal 1.0 cm node with max SUV 5.5 (series 4/image 61). Enlarged hypermetabolic high right paraesophageal mediastinal 1.5 cm node with max SUV 5.6 (series 4/image 48). No hypermetabolic axillary nodes. Incidental CT findings: Small dependent right pleural effusion. Posterior right upper lobe 5 mm solid pulmonary nodule (series 8/image 20), below PET resolution, new since 04/06/2018 CT. No additional significant pulmonary nodules. Atherosclerotic nonaneurysmal abdominal aorta. Coronary atherosclerosis. Top-normal heart size. Mild centrilobular emphysema. ABDOMEN/PELVIS: No abnormal hypermetabolic activity within the liver, pancreas, adrenal glands, or spleen. No hypermetabolic lymph nodes in the abdomen or pelvis. Incidental CT findings: Cholecystectomy. Atherosclerotic nonaneurysmal abdominal aorta. Mild sigmoid diverticulosis. SKELETON: No focal hypermetabolic activity to suggest skeletal metastasis. Incidental CT findings: none IMPRESSION: 1. Intensely hypermetabolic 5.5 cm right middle lobe lung mass, compatible with primary bronchogenic carcinoma. Right middle lobe postobstructive atelectasis. 2. New 5 mm posterior right upper lobe pulmonary nodule, below PET resolution, recommend attention on follow-up chest CT in 3 months. 3. Small dependent right pleural effusion. 4. Hypermetabolic ipsilateral and contralateral hilar, subcarinal, right paratracheal, high  right paraesophageal and right thoracic inlet adenopathy. 5. Otherwise no hypermetabolic extrathoracic or osseous metastatic disease. 6. Aortic Atherosclerosis (ICD10-I70.0) and Emphysema (ICD10-J43.9). Electronically Signed   By: Ilona Sorrel M.D.   On: 04/28/2018 11:41   Dg Chest Port 1 View  Result Date: 04/27/2018 CLINICAL DATA:  Followup bronchoscopy. EXAM: PORTABLE CHEST 1 VIEW COMPARISON:  04/25/2018 FINDINGS: Heart size is unchanged. Aortic atherosclerosis as seen previously. Interstitial prominence that could reflect mild interstitial edema. Airspace filling in the right lower lung, slightly worsened, possibly secondary to postprocedure hemorrhage or lavage. No pneumothorax or hemothorax. IMPRESSION: Increased alveolar filling in the right lower lung. This could be due to postprocedure hemorrhage or lavage. Mild interstitial prominence increase which could be due to mild edema. Electronically Signed   By: Nelson Chimes M.D.   On: 04/27/2018 11:02   Mm 3d Screen Breast Bilateral  Result Date: 05/04/2018 CLINICAL DATA:  Screening. EXAM: DIGITAL SCREENING BILATERAL MAMMOGRAM WITH TOMO AND CAD COMPARISON:  Previous exam(s). ACR Breast Density Category b: There are scattered areas of fibroglandular density. FINDINGS: There are no findings suspicious for malignancy. Images were processed with  CAD. IMPRESSION: No mammographic evidence of malignancy. A result letter of this screening mammogram will be mailed directly to the patient. RECOMMENDATION: Screening mammogram in one year. (Code:[id number]) BI-RADS CATEGORY  1: Negative. Electronically Signed   By: Ammie Ferrier M.D.   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  The patient also has small but suspicious right pleural effusion that need to be evaluated to rule out stage IV lung cancer.   PLAN: I had a lengthy discussion with the patient and her friend today about her current disease stage, prognosis and treatment options.  I personally and independently reviewed the scan images and discussed the result and showed the images to the patient today. I recommended for the patient to complete the staging work-up by ordering MRI of the brain to rule out brain metastasis. I also recommended for the patient to have ultrasound-guided diagnostic right thoracentesis to rule out malignant right pleural effusion. If the pleural fluid is negative for malignancy, the patient may benefit from ultrasound-guided biopsy of the hypermetabolic right supraclavicular lymph node for confirmation of stage IIIB lung cancer. I also discussed with the patient her treatment options if the final pathology is consistent with a stage IIIb lung cancer including a course of concurrent chemoradiation with weekly carboplatin for AUC of 2 and paclitaxel 45 NG/M2 for a total of 6-7 weeks followed by restaging scans and consideration of consolidation treatment with immunotherapy with Imfinzi (Durvalumab) if the patient has no disease progression at that time. I discussed with the patient the adverse effect of the chemotherapy including but not limited to alopecia, myelosuppression, nausea and vomiting, peripheral neuropathy, liver or renal dysfunction. She is expected to start the first cycle of this treatment on May 15, 2018. The patient was seen during the multidisciplinary thoracic oncology clinic today by medical oncology, radiation oncology, social worker and thoracic navigator. She will come back for follow-up visit in 2 weeks for evaluation management of any adverse effect of her treatment. I will arrange for the patient to have a  chemotherapy education class before the first dose of her treatment. I will also call her pharmacy with prescription for Compazine 10 mg p.o. every 6 hours as needed for nausea. I strongly recommended for the patient to continue quitting smoking. She was advised to call immediately if she has any concerning symptoms in the interval.  The patient voices understanding of current disease status and treatment options and is in agreement with the current care plan.  All questions were answered. The patient knows to call the clinic with any problems, questions or concerns. We can certainly see the patient much sooner if necessary.  Thank you so much for allowing me to participate in the care of [name]. I will continue to follow up the patient with you and assist in her care.  I spent 55 minutes counseling the patient face to face. The total time spent in the appointment was 80 minutes.  Disclaimer: This note was dictated with voice recognition software. Similar sounding words can inadvertently be transcribed and may not be corrected upon review.   Eilleen Kempf May 04, 2018, 1:03 PM

## 2018-05-05 ENCOUNTER — Telehealth: Payer: Self-pay | Admitting: Internal Medicine

## 2018-05-05 NOTE — Telephone Encounter (Signed)
Scheduled appt per 9/19 los - pt is aware of aptps scheduled. States she will get a calender when she comes in for chemo edu.

## 2018-05-07 ENCOUNTER — Ambulatory Visit
Admission: RE | Admit: 2018-05-07 | Discharge: 2018-05-07 | Disposition: A | Discharge status: Home / Self Care | Payer: Medicare Other | Source: Ambulatory Visit | Attending: Thoracic Surgery (Cardiothoracic Vascular Surgery) | Admitting: Thoracic Surgery (Cardiothoracic Vascular Surgery)

## 2018-05-07 DIAGNOSIS — C3491 Malignant neoplasm of unspecified part of right bronchus or lung: Secondary | ICD-10-CM | POA: Diagnosis not present

## 2018-05-07 MED ORDER — GADOBENATE DIMEGLUMINE 529 MG/ML IV SOLN
15.0000 mL | Freq: Once | INTRAVENOUS | Status: AC | PRN
  Administered 2018-05-07: 15 mL via INTRAVENOUS

## 2018-05-08 ENCOUNTER — Ambulatory Visit (HOSPITAL_COMMUNITY)
Admission: RE | Admit: 2018-05-08 | Discharge: 2018-05-08 | Disposition: A | Discharge status: Home / Self Care | Payer: Medicare Other | Source: Ambulatory Visit | Attending: Student | Admitting: Student

## 2018-05-08 ENCOUNTER — Ambulatory Visit (HOSPITAL_COMMUNITY)
Admission: RE | Admit: 2018-05-08 | Discharge: 2018-05-08 | Disposition: A | Discharge status: Home / Self Care | Payer: Medicare Other | Source: Ambulatory Visit | Attending: Internal Medicine | Admitting: Internal Medicine

## 2018-05-08 ENCOUNTER — Inpatient Hospital Stay: Payer: Medicare Other

## 2018-05-08 DIAGNOSIS — Z9889 Other specified postprocedural states: Secondary | ICD-10-CM

## 2018-05-08 DIAGNOSIS — R846 Abnormal cytological findings in specimens from respiratory organs and thorax: Secondary | ICD-10-CM | POA: Diagnosis not present

## 2018-05-08 DIAGNOSIS — J90 Pleural effusion, not elsewhere classified: Secondary | ICD-10-CM | POA: Insufficient documentation

## 2018-05-08 DIAGNOSIS — C342 Malignant neoplasm of middle lobe, bronchus or lung: Secondary | ICD-10-CM

## 2018-05-08 MED ORDER — LIDOCAINE HCL 1 % IJ SOLN
INTRAMUSCULAR | Status: AC
  Filled 2018-05-08: qty 20

## 2018-05-08 NOTE — Procedures (Signed)
PROCEDURE SUMMARY:  Successful US guided diagnostic and therapeutic right thoracentesis. Yielded 450 mL of clear, yellow fluid. Pt tolerated procedure well. No immediate complications.  Specimen was sent for labs. CXR ordered.  Docia Barrier PA-C 05/08/2018 11:02 AM

## 2018-05-09 ENCOUNTER — Telehealth: Payer: Self-pay | Admitting: Radiation Oncology

## 2018-05-09 ENCOUNTER — Encounter: Payer: Self-pay | Admitting: *Deleted

## 2018-05-09 NOTE — Telephone Encounter (Signed)
I spoke with the patient and let her know her brain MRI was negative. We will follow up with her cytology from her thoracentesis.

## 2018-05-09 NOTE — Telephone Encounter (Signed)
Cytology is negative.  We should proceed with a course of concurrent chemoradiation.  Consideration of ultrasound-guided biopsy of the right supraclavicular lymph node may help with the radiation port.

## 2018-05-11 ENCOUNTER — Encounter: Payer: Self-pay | Admitting: *Deleted

## 2018-05-11 ENCOUNTER — Telehealth: Payer: Self-pay | Admitting: *Deleted

## 2018-05-11 ENCOUNTER — Ambulatory Visit (HOSPITAL_COMMUNITY)
Admission: RE | Admit: 2018-05-11 | Discharge: 2018-05-11 | Disposition: A | Discharge status: Home / Self Care | Payer: Medicare Other | Source: Ambulatory Visit | Attending: Thoracic Surgery (Cardiothoracic Vascular Surgery) | Admitting: Thoracic Surgery (Cardiothoracic Vascular Surgery)

## 2018-05-11 DIAGNOSIS — R918 Other nonspecific abnormal finding of lung field: Secondary | ICD-10-CM | POA: Insufficient documentation

## 2018-05-11 DIAGNOSIS — C342 Malignant neoplasm of middle lobe, bronchus or lung: Secondary | ICD-10-CM

## 2018-05-11 LAB — PULMONARY FUNCTION TEST
DL/VA % pred: 79 %
DL/VA: 3.9 ml/min/mmHg/L
DLCO COR: 12.65 ml/min/mmHg
DLCO UNC: 10.35 ml/min/mmHg
DLCO cor % pred: 49 %
DLCO unc % pred: 40 %
FEF 25-75 POST: 1.54 L/s
FEF 25-75 Pre: 1.52 L/sec
FEF2575-%Change-Post: 1 %
FEF2575-%Pred-Post: 91 %
FEF2575-%Pred-Pre: 90 %
FEV1-%Change-Post: 1 %
FEV1-%Pred-Post: 78 %
FEV1-%Pred-Pre: 76 %
FEV1-POST: 1.46 L
FEV1-Pre: 1.44 L
FEV1FVC-%CHANGE-POST: 0 %
FEV1FVC-%Pred-Pre: 107 %
FEV6-%Change-Post: 2 %
FEV6-%PRED-PRE: 74 %
FEV6-%Pred-Post: 76 %
FEV6-POST: 1.77 L
FEV6-PRE: 1.73 L
FEV6FVC-%PRED-POST: 104 %
FEV6FVC-%PRED-PRE: 104 %
FVC-%Change-Post: 1 %
FVC-%PRED-PRE: 71 %
FVC-%Pred-Post: 73 %
FVC-Post: 1.77 L
POST FEV6/FVC RATIO: 100 %
PRE FEV6/FVC RATIO: 100 %
Post FEV1/FVC ratio: 82 %
Pre FEV1/FVC ratio: 83 %
RV % PRED: 104 %
RV: 2.38 L
TLC % PRED: 82 %
TLC: 4.3 L

## 2018-05-11 MED ORDER — ALBUTEROL SULFATE (2.5 MG/3ML) 0.083% IN NEBU
2.5000 mg | INHALATION_SOLUTION | Freq: Once | RESPIRATORY_TRACT | Status: AC
  Administered 2018-05-11: 2.5 mg via RESPIRATORY_TRACT

## 2018-05-11 NOTE — Telephone Encounter (Signed)
Oncology Nurse Navigator Documentation  Oncology Nurse Navigator Flowsheets 05/11/2018  Navigator Location [facility]  Navigator Encounter Type Telephone/I followed up with Dr. Julien Nordmann regarding Ms. Langsam recent cytology being negative.  He would like [place] right supraclavicular node to be biopsied if rad onc needs for work up.  I spoke with Shona Simpson PA and rad onc would like biopsy to be completed.  VO received from Dr. Julien Nordmann.  I called patient to update.  She is questioning weather she needs chemo on 9/30.  I will reach out to Dr. Julien Nordmann for an update and notify patient with an update.   Telephone Outgoing Call  Treatment Phase Pre-Tx/Tx Discussion  Barriers/Navigation Needs Education;Coordination of Care  Education Other  Interventions Coordination of Care;Education  Coordination of Care Other  Education Method Verbal  Acuity Level 2  Time Spent with Patient 30

## 2018-05-12 ENCOUNTER — Other Ambulatory Visit: Payer: Self-pay | Admitting: *Deleted

## 2018-05-12 DIAGNOSIS — C342 Malignant neoplasm of middle lobe, bronchus or lung: Secondary | ICD-10-CM

## 2018-05-14 ENCOUNTER — Telehealth: Payer: Self-pay | Admitting: *Deleted

## 2018-05-14 NOTE — Assessment & Plan Note (Deleted)
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    Plan: Chemotherapy

## 2018-05-14 NOTE — Telephone Encounter (Signed)
Oncology Nurse Navigator Documentation  Oncology Nurse Navigator Flowsheets 05/14/2018  Navigator Location [facility]  Navigator Encounter Type Telephone/I received a message from Dr. Julien Nordmann asking to post pone patient's treatment.  I called patient to update her and she verbalized understanding. She is getting a ride from [place] with cancer center and I asked her to call and cancel that ride for tomorrow. She states she will call and cancel. I will also update CSW on need to cancel ride.  I will also update infusion room DD and AD of Jenna [name]'s postponement of appt.    Telephone Outgoing Call  Treatment Phase Pre-Tx/Tx Discussion  Barriers/Navigation Needs Education;Coordination of Care  Education Other  Interventions Coordination of Care;Education  Coordination of Care Other  Education Method Verbal  Acuity Level 2  Time Spent with Patient 30

## 2018-05-14 NOTE — Progress Notes (Deleted)
[facility] Follow up:    Jenna [name], Jenna [name] [id number]   DIAGNOSIS: Cancer Staging Malignant neoplasm of middle lobe of right lung [facility]) Staging form: Lung, AJCC 8th Edition - Clinical: Stage IIIC (cT3, cN3, cM0) - Unsigned   SUMMARY OF ONCOLOGIC HISTORY:   Malignant neoplasm of middle lobe of right lung ([place])   04/27/2018 Initial Biopsy    bronchoscopy with endobronchial ultrasound and biopsies, the final pathology ([id number]) was consistent with a squamous cell carcinoma.  The fine-needle aspiration of the lymph nodes from level 11 R, 4R and paraesophageal lymph nodes were negative for malignancy.    04/28/2018 PET scan    showed intensely hypermetabolic 9.3AT right middle lobe lung mass compatible with primary bronchogenic carcinoma.  There was a right middle lobe postobstructive atelectasis.  There was new 0.5 cm posterior right upper lobe pulmonary nodule below PET resolution.  There was a small dependent right pleural effusion.  The PET scan also showed hypermetabolic ipsilateral and contralateral hilar, subcarinal, right paratracheal, high right paraesophageal and right thoracic inlet adenopathy.  There was no hypermetabolic extrathoracic or osseous metastatic disease.    05/04/2018 Initial Diagnosis    Malignant neoplasm of middle lobe of right lung ([place])    05/08/2018 Procedure    Ultrasound guided Thoracentesis yielded 450 ml of fluid, path negative for malignant cells    05/08/2018 Imaging    MRI brain negative for metastases    05/15/2018 -  Chemotherapy    The patient had palonosetron (ALOXI) injection 0.25 mg, 0.25 mg, Intravenous,  Once, 0 of 7 cycles CARBOplatin (PARAPLATIN) 180 mg in sodium chloride 0.9 % 100 mL chemo infusion, 180 mg (100 % of original dose 180.2 mg), Intravenous,  Once, 0 of 7 cycles Dose modification: 180.2 mg (original dose 180.2 mg, Cycle 1) PACLitaxel (TAXOL) 84 mg in sodium chloride 0.9 %  250 mL chemo infusion (</= 80mg /m2), 45 mg/m2 = 84 mg, Intravenous,  Once, 0 of 7 cycles  for chemotherapy treatment.      CURRENT THERAPY: Taxol Carbo cycle 1  INTERVAL HISTORY: Jenna [name] 72 y.o. female returns for    Patient Active Problem List   Diagnosis Date Noted  . Malignant neoplasm of middle lobe of right lung (Jenna [name]) 05/04/2018  . Goals of care, counseling/discussion 05/04/2018  . Encounter for antineoplastic chemotherapy 05/04/2018  . Cough 03/23/2018  . HLD (hyperlipidemia) 03/20/2018  . AVM (arteriovenous malformation) 03/15/2012  . GERD 09/26/2009  . CHANGE IN BOWELS 09/26/2009  . Type 2 diabetes mellitus without complications ([place]) 55/73/2202  . OVERWEIGHT 09/25/2009  . Iron deficiency anemia 09/25/2009  . SMOKER 09/25/2009  . Hypertension 09/25/2009  . CHOLECYSTITIS, HX OF 09/25/2009    is allergic to aspirin; esomeprazole magnesium; and ace inhibitors.  MEDICAL HISTORY: Past Medical History:  Diagnosis Date  . DM (diabetes mellitus) ([place])   . GERD (gastroesophageal reflux disease)   . Hypertension   . Iron deficiency anemia     SURGICAL HISTORY: Past Surgical History:  Procedure Laterality Date  . CATARACT EXTRACTION Right   . CHOLECYSTECTOMY    . COLONOSCOPY  10/24/2009   normal rectum/1X1cm abnormal lesion in the ascending colon (bx benign). TI normal for 10cm.  Prep difficult/inadequate. f/u TCS 09/2012 recommended  . COLONOSCOPY  10/13/2004   Normal rectum/Diminutive polyps, splenic flexure, cold biopsied/removed.  Remainder of colonic mucosa appeared normal.  . COLONOSCOPY N/A 12/14/2012   RKY:HCWCBJS polyp-tubular adenoma  .  ESOPHAGOGASTRODUODENOSCOPY  10/13/2004    Normal esophagus/ Nodular volcano like lesion in the antrum, either representing a  pancreatic rest or leiomyoma, biopsied.  Remainder of the gastric mucosa appeared normal, normal D1-D2  . ESOPHAGOGASTRODUODENOSCOPY  10/24/2009   Benign biopsies. normal esophagus/small  hiatal hernia/nodular lesion antrum/distal greater curvature. duodenal AVM s/p ablation  . GIVENS CAPSULE STUDY  07/27/2010    multiple arteriovenous malformations which could definitely be the contributor to her drifting hemoglobin and hematocrit  . TUBAL LIGATION    . VIDEO BRONCHOSCOPY WITH ENDOBRONCHIAL ULTRASOUND N/A 04/27/2018   Procedure: VIDEO BRONCHOSCOPY WITH ENDOBRONCHIAL ULTRASOUND;  Surgeon: Melrose Nakayama, MD;  Location: [place];  Service: Thoracic;  Laterality: N/A;    SOCIAL HISTORY: Social History   Socioeconomic History  . Marital status: Single    Spouse name: Not on file  . Number of children: Not on file  . Years of education: Not on file  . Highest education level: Not on file  Occupational History  . Not on file  Social Needs  . Financial resource strain: Not on file  . Food insecurity:    Worry: Not on file    Inability: Not on file  . Transportation needs:    Medical: Yes    Non-medical: No  Tobacco Use  . Smoking status: Former Smoker    Packs/day: 0.50    Years: 55.00    Pack years: 27.50    Types: Cigarettes    Last attempt to quit: 03/18/2018    Years since quitting: 0.1  . Smokeless tobacco: Never Used  . Tobacco comment: smoked off and n  Substance and Sexual Activity  . Alcohol use: No  . Drug use: No  . Sexual activity: Not on file  Lifestyle  . Physical activity:    Days per week: Not on file    Minutes per session: Not on file  . Stress: Not on file  Relationships  . Social connections:    Talks on phone: Not on file    Gets together: Not on file    Attends religious service: Not on file    Active member of club or organization: Not on file    Attends meetings of clubs or organizations: Not on file    Relationship status: Not on file  . Intimate partner violence:    Fear of current or ex partner: Not on file    Emotionally abused: Not on file    Physically abused: Not on file    Forced sexual activity: Not on file  Other  Topics Concern  . Not on file  Social History Narrative  . Not on file    FAMILY HISTORY: Family History  Problem Relation Age of Onset  . Colon cancer Maternal Aunt        greater than age 64  . Breast cancer Cousin   . Amblyopia Neg Hx   . Blindness Neg Hx   . Cataracts Neg Hx   . Diabetes Neg Hx   . Glaucoma Neg Hx   . Macular degeneration Neg Hx   . Retinal detachment Neg Hx   . Strabismus Neg Hx   . Retinitis pigmentosa Neg Hx     Review of Systems - Oncology    PHYSICAL EXAMINATION  ECOG PERFORMANCE STATUS: {CHL ONC ECOG [phone]}  There were no vitals filed for this visit.  Physical Exam  LABORATORY DATA:  CBC    Component Value Date/Time   WBC 4.2 05/04/2018 1225  WBC 3.3 (L) 04/25/2018 1544   RBC 3.49 (L) 05/04/2018 1225   HGB 8.7 (L) 05/04/2018 1225   HGB 8.1 (L) 12/27/2017 1123   HCT 28.1 (L) 05/04/2018 1225   HCT 24.4 (L) 12/27/2017 1123   PLT 262 05/04/2018 1225   PLT 296 12/27/2017 1123   MCV 80.7 05/04/2018 1225   MCV 70 (L) 12/27/2017 1123   MCH 25.0 (L) 05/04/2018 1225   MCHC 31.0 (L) 05/04/2018 1225   RDW 17.0 (H) 05/04/2018 1225   RDW 17.0 (H) 12/27/2017 1123   LYMPHSABS 0.4 (L) 05/04/2018 1225   LYMPHSABS 0.8 12/27/2017 1123   MONOABS 0.4 05/04/2018 1225   EOSABS 0.0 05/04/2018 1225   EOSABS 0.1 12/27/2017 1123   BASOSABS 0.0 05/04/2018 1225   BASOSABS 0.0 12/27/2017 1123    CMP     Component Value Date/Time   NA 141 05/04/2018 1225   K 3.7 05/04/2018 1225   CL 107 05/04/2018 1225   CO2 27 05/04/2018 1225   GLUCOSE 169 (H) 05/04/2018 1225   BUN 9 05/04/2018 1225   BUN 12 12/10/2011 0919   CREATININE 0.91 05/04/2018 1225   CREATININE 0.72 12/10/2011 0919   CALCIUM 9.0 05/04/2018 1225   PROT 7.8 05/04/2018 1225   ALBUMIN 3.0 (L) 05/04/2018 1225   ALBUMIN 4.3 12/10/2011 0919   AST 26 05/04/2018 1225   ALT 12 05/04/2018 1225   ALKPHOS 123 05/04/2018 1225   ALKPHOS 88 12/10/2011 0919   BILITOT 0.3 05/04/2018 1225    GFRNONAA >60 05/04/2018 1225   GFRAA >60 05/04/2018 1225      ASSESSMENT and PLAN:   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) Denitra is a 72 year old woman with 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    Plan: Chemotherapy   No orders of the defined types were placed in this encounter.   All questions were answered. The patient knows to call the clinic with any problems, questions or concerns. We can certainly see the patient much sooner if necessary. This note was electronically signed. Scot Dock, NP 05/14/2018

## 2018-05-15 ENCOUNTER — Inpatient Hospital Stay: Payer: Medicare Other

## 2018-05-15 ENCOUNTER — Inpatient Hospital Stay: Payer: Medicare Other | Admitting: Adult Health

## 2018-05-17 ENCOUNTER — Other Ambulatory Visit: Payer: Self-pay | Admitting: Radiology

## 2018-05-18 ENCOUNTER — Encounter (HOSPITAL_COMMUNITY): Payer: Self-pay

## 2018-05-18 ENCOUNTER — Ambulatory Visit
Admission: RE | Admit: 2018-05-18 | Discharge: 2018-05-18 | Disposition: A | Discharge status: Home / Self Care | Payer: Medicare Other | Source: Ambulatory Visit | Attending: Radiation Oncology | Admitting: Radiation Oncology

## 2018-05-18 ENCOUNTER — Ambulatory Visit (HOSPITAL_COMMUNITY)
Admission: RE | Admit: 2018-05-18 | Discharge: 2018-05-18 | Disposition: A | Discharge status: Home / Self Care | Payer: Medicare Other | Source: Ambulatory Visit | Attending: Internal Medicine | Admitting: Internal Medicine

## 2018-05-18 DIAGNOSIS — I10 Essential (primary) hypertension: Secondary | ICD-10-CM | POA: Diagnosis not present

## 2018-05-18 DIAGNOSIS — C342 Malignant neoplasm of middle lobe, bronchus or lung: Secondary | ICD-10-CM | POA: Insufficient documentation

## 2018-05-18 DIAGNOSIS — Z7984 Long term (current) use of oral hypoglycemic drugs: Secondary | ICD-10-CM | POA: Insufficient documentation

## 2018-05-18 DIAGNOSIS — Z9049 Acquired absence of other specified parts of digestive tract: Secondary | ICD-10-CM | POA: Diagnosis not present

## 2018-05-18 DIAGNOSIS — I700 Atherosclerosis of aorta: Secondary | ICD-10-CM | POA: Insufficient documentation

## 2018-05-18 DIAGNOSIS — D509 Iron deficiency anemia, unspecified: Secondary | ICD-10-CM | POA: Diagnosis not present

## 2018-05-18 DIAGNOSIS — D72822 Plasmacytosis: Secondary | ICD-10-CM | POA: Diagnosis not present

## 2018-05-18 DIAGNOSIS — K5730 Diverticulosis of large intestine without perforation or abscess without bleeding: Secondary | ICD-10-CM | POA: Insufficient documentation

## 2018-05-18 DIAGNOSIS — Z87891 Personal history of nicotine dependence: Secondary | ICD-10-CM | POA: Diagnosis not present

## 2018-05-18 DIAGNOSIS — K219 Gastro-esophageal reflux disease without esophagitis: Secondary | ICD-10-CM | POA: Diagnosis not present

## 2018-05-18 DIAGNOSIS — J439 Emphysema, unspecified: Secondary | ICD-10-CM | POA: Insufficient documentation

## 2018-05-18 DIAGNOSIS — Z510 Encounter for antineoplastic radiation therapy: Secondary | ICD-10-CM | POA: Diagnosis not present

## 2018-05-18 DIAGNOSIS — Z79899 Other long term (current) drug therapy: Secondary | ICD-10-CM | POA: Diagnosis not present

## 2018-05-18 DIAGNOSIS — J90 Pleural effusion, not elsewhere classified: Secondary | ICD-10-CM | POA: Insufficient documentation

## 2018-05-18 DIAGNOSIS — Z803 Family history of malignant neoplasm of breast: Secondary | ICD-10-CM | POA: Diagnosis not present

## 2018-05-18 DIAGNOSIS — R590 Localized enlarged lymph nodes: Secondary | ICD-10-CM | POA: Diagnosis not present

## 2018-05-18 DIAGNOSIS — E119 Type 2 diabetes mellitus without complications: Secondary | ICD-10-CM | POA: Insufficient documentation

## 2018-05-18 LAB — CBC
HCT: 29.7 % — ABNORMAL LOW (ref 36.0–46.0)
HEMOGLOBIN: 8.5 g/dL — AB (ref 12.0–15.0)
MCH: 24.3 pg — AB (ref 26.0–34.0)
MCHC: 28.6 g/dL — ABNORMAL LOW (ref 30.0–36.0)
MCV: 84.9 fL (ref 78.0–100.0)
Platelets: 297 10*3/uL (ref 150–400)
RBC: 3.5 MIL/uL — ABNORMAL LOW (ref 3.87–5.11)
RDW: 17.2 % — ABNORMAL HIGH (ref 11.5–15.5)
WBC: 4.3 10*3/uL (ref 4.0–10.5)

## 2018-05-18 LAB — PROTIME-INR
INR: 1.09
Prothrombin Time: 14 seconds (ref 11.4–15.2)

## 2018-05-18 LAB — GLUCOSE, CAPILLARY: Glucose-Capillary: 162 mg/dL — ABNORMAL HIGH (ref 70–99)

## 2018-05-18 MED ORDER — MIDAZOLAM HCL 2 MG/2ML IJ SOLN
INTRAMUSCULAR | Status: AC
  Filled 2018-05-18: qty 2

## 2018-05-18 MED ORDER — SODIUM CHLORIDE 0.9 % IV SOLN: INTRAVENOUS | Status: DC

## 2018-05-18 MED ORDER — MIDAZOLAM HCL 2 MG/2ML IJ SOLN
INTRAMUSCULAR | Status: AC | PRN
  Administered 2018-05-18: 1 mg via INTRAVENOUS

## 2018-05-18 MED ORDER — LIDOCAINE HCL (PF) 1 % IJ SOLN
INTRAMUSCULAR | Status: AC
  Filled 2018-05-18: qty 30

## 2018-05-18 MED ORDER — FENTANYL CITRATE (PF) 100 MCG/2ML IJ SOLN
INTRAMUSCULAR | Status: AC
  Filled 2018-05-18: qty 2

## 2018-05-18 MED ORDER — SODIUM CHLORIDE 0.9 % IV SOLN
INTRAVENOUS | Status: AC | PRN
  Administered 2018-05-18: 10 mL/h via INTRAVENOUS

## 2018-05-18 MED ORDER — FENTANYL CITRATE (PF) 100 MCG/2ML IJ SOLN
INTRAMUSCULAR | Status: AC | PRN
  Administered 2018-05-18: 25 ug via INTRAVENOUS

## 2018-05-18 NOTE — Procedures (Signed)
R neck LN Bx 18 g core times two EBL 0 Comp 0

## 2018-05-18 NOTE — H&P (Signed)
Chief Complaint: Patient was seen in consultation today for lymphadenopathy  Referring Physician(s): Mohamed,Mohamed  Supervising Physician: Marybelle Killings  Patient Status: Jenna [name]  History of Present Illness: Jenna [name] is a 72 y.o. female with past medical history of DM, GERD, HTN who presents to [facility] for lymphadenopathy.  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 Her case was recently discussed at thoracic tumor conference and decision was made to pursue lymph node biopsy for staging purposes.   She presents in her usual state of health today.  Denies new concerns or complaints.  She has been NPO.  She does not take blood thinners.   Past Medical History:  Diagnosis Date  . DM (diabetes mellitus) ([place])   . GERD (gastroesophageal reflux disease)   . Hypertension   . Iron deficiency anemia     Past Surgical History:  Procedure Laterality Date  . CATARACT EXTRACTION Right   . CHOLECYSTECTOMY    . COLONOSCOPY  10/24/2009   normal rectum/1X1cm abnormal lesion in the ascending colon (bx benign). TI normal for 10cm.  Prep difficult/inadequate. f/u TCS 09/2012 recommended  . COLONOSCOPY  10/13/2004   Normal rectum/Diminutive polyps, splenic flexure, cold biopsied/removed.  Remainder of colonic mucosa appeared normal.  . COLONOSCOPY N/A 12/14/2012   YNW:GNFAOZH polyp-tubular adenoma  . ESOPHAGOGASTRODUODENOSCOPY  10/13/2004    Normal esophagus/ Nodular volcano like lesion in the antrum, either representing a  pancreatic rest or leiomyoma, biopsied.  Remainder of the gastric mucosa appeared normal, normal D1-D2  . ESOPHAGOGASTRODUODENOSCOPY  10/24/2009   Benign biopsies. normal esophagus/small hiatal hernia/nodular lesion antrum/distal greater curvature. duodenal AVM s/p ablation  . GIVENS CAPSULE STUDY  07/27/2010    multiple arteriovenous malformations which could definitely be the contributor to her drifting hemoglobin and  hematocrit  . TUBAL LIGATION    . VIDEO BRONCHOSCOPY WITH ENDOBRONCHIAL ULTRASOUND N/A 04/27/2018   Procedure: VIDEO BRONCHOSCOPY WITH ENDOBRONCHIAL ULTRASOUND;  Surgeon: Melrose Nakayama, MD;  Location: [facility] OR;  Service: Thoracic;  Laterality: N/A;    Allergies: Aspirin; Esomeprazole magnesium; and Ace inhibitors  Medications: Prior to Admission medications   Medication Sig Start Date End Date Taking? Authorizing Provider  dexlansoprazole (DEXILANT) 60 MG capsule 1 BY MOUTH 30 MIN BEFORE BREAKFAST DAILY Patient taking differently: Take 60 mg by mouth See admin instructions. 1 BY MOUTH 30 MIN BEFORE BREAKFAST DAILY 03/27/13  Yes Mahala Menghini, PA-C  docusate sodium (COLACE) 100 MG capsule Take 100 mg by mouth daily as needed for mild constipation.   Yes [provider]  ferrous sulfate 325 (65 FE) MG tablet Take 325 mg by mouth 2 (two) times daily with a meal.    Yes [provider]  glipiZIDE (GLUCOTROL) 10 MG tablet Take 1 tablet (10 mg total) by mouth 2 (two) times daily before a meal. 03/20/18  Yes [name], MD  labetalol (NORMODYNE) 100 MG tablet Take 1 tablet (100 mg total) by mouth 2 (two) times daily. 04/06/18  Yes [name], MD  Multiple Vitamin (MULTIVITAMIN WITH MINERALS) TABS tablet Take 1 tablet by mouth at bedtime.    Yes [provider]  simvastatin (ZOCOR) 20 MG tablet Take 20 mg by mouth every evening.   Yes [provider]  diphenhydrAMINE (BENADRYL) 2 % cream Apply 1 application topically as needed for itching.    [provider]  prochlorperazine (COMPAZINE) 10 MG tablet Take 1 tablet (10 mg total) by mouth every 6 (six)  hours as needed for nausea or vomiting. 05/04/18   Curt Bears, MD     Family History  Problem Relation Age of Onset  . Colon cancer Maternal Aunt        greater than age 23  . Breast cancer Cousin   . Amblyopia Neg Hx   . Blindness Neg Hx   . Cataracts Neg Hx   . Diabetes Neg Hx   .  Glaucoma Neg Hx   . Macular degeneration Neg Hx   . Retinal detachment Neg Hx   . Strabismus Neg Hx   . Retinitis pigmentosa Neg Hx     Social History   Socioeconomic History  . Marital status: Single    Spouse name: Not on file  . Number of children: Not on file  . Years of education: Not on file  . Highest education level: Not on file  Occupational History  . Not on file  Social Needs  . Financial resource strain: Not on file  . Food insecurity:    Worry: Not on file    Inability: Not on file  . Transportation needs:    Medical: Yes    Non-medical: No  Tobacco Use  . Smoking status: Former Smoker    Packs/day: 0.50    Years: 55.00    Pack years: 27.50    Types: Cigarettes    Last attempt to quit: 03/18/2018    Years since quitting: 0.1  . Smokeless tobacco: Never Used  . Tobacco comment: smoked off and n  Substance and Sexual Activity  . Alcohol use: No  . Drug use: No  . Sexual activity: Not on file  Lifestyle  . Physical activity:    Days per week: Not on file    Minutes per session: Not on file  . Stress: Not on file  Relationships  . Social connections:    Talks on phone: Not on file    Gets together: Not on file    Attends religious service: Not on file    Active member of club or organization: Not on file    Attends meetings of clubs or organizations: Not on file    Relationship status: Not on file  Other Topics Concern  . Not on file  Social History Narrative  . Not on file     Review of Systems: A 12 point ROS discussed and pertinent positives are indicated in the HPI above.  All other systems are negative.  Review of Systems  Constitutional: Negative for fatigue and fever.  Respiratory: Positive for cough. Negative for shortness of breath.   Cardiovascular: Negative for chest pain.  Gastrointestinal: Negative for abdominal pain.  Musculoskeletal: Negative for back pain.  Psychiatric/Behavioral: Negative for behavioral problems and confusion.     Vital Signs: BP (!) 177/71   Pulse 78   Temp 98.3 F (36.8 C) (Oral)   Resp 16   Ht 5\' 5"  (1.651 m)   Wt 170 lb (77.1 kg)   BMI 28.29 kg/m   Physical Exam  Constitutional: She is oriented to person, place, and time. She appears well-developed. No distress.  Neck: Normal range of motion. Neck supple.  Cardiovascular: Normal rate, regular rhythm and normal heart sounds. Exam reveals no gallop and no friction rub.  No murmur heard. Pulmonary/Chest: Effort normal. No respiratory distress. She has wheezes (faint expiratory wheeze).  Abdominal: Soft. She exhibits no distension. There is no tenderness.  Lymphadenopathy:    She has no cervical adenopathy (no palpable adenopathy).  Neurological: She is alert and oriented to person, place, and time.  Skin: Skin is warm and dry. She is not diaphoretic.  Psychiatric: She has a normal mood and affect. Her behavior is normal. Judgment and thought content normal.  Nursing note and vitals reviewed.    MD Evaluation Airway: WNL Heart: WNL Abdomen: WNL Chest/ Lungs: WNL ASA  Classification: 3 Mallampati/Airway Score: One   Imaging: Dg Chest 1 View  Result Date: 05/08/2018 CLINICAL DATA:  Post right-sided thoracentesis. EXAM: CHEST  1 VIEW COMPARISON:  04/27/2018; PET-CT-04/28/2018 FINDINGS: Grossly unchanged cardiac silhouette and mediastinal contours. Interval reduction/resolution trace right-sided pleural effusion post thoracentesis. No pneumothorax. Improved aeration of the bilateral lung bases with persistent consolidative opacity encompassing the majority of the right middle lobe as demonstrated on preceding PET-CT. No evidence of edema.  No acute osseus abnormalities. IMPRESSION: 1. Interval reduction/resolution of trace right-sided pleural effusion post thoracentesis. No pneumothorax. 2. Improved aeration the lungs with persistent consolidative opacities within the right middle lobe as demonstrated on preceding PET-CT.  Electronically Signed   By: Sandi Mariscal M.D.   On: 05/08/2018 11:27   Dg Chest 2 View  Result Date: 04/26/2018 CLINICAL DATA:  Video bronchoscopy EXAM: CHEST - 2 VIEW COMPARISON:  CT 04/06/2018 FINDINGS: Masslike opacity in the right lower lobe as seen on CT. No pneumothorax following bronchoscopy. Heart is upper limits normal in size. No confluent opacity on the left. No effusions. No acute bony abnormality. IMPRESSION: Masslike opacity in the right lower lung. No pneumothorax. Electronically Signed   By: Rolm Baptise M.D.   On: 04/26/2018 09:09   Mr Jeri Cos QQ Contrast  Result Date: 05/08/2018 CLINICAL DATA:  72 year old female with recently diagnosed hypermetabolic right lung mass. Staging. EXAM: MRI HEAD WITHOUT AND WITH CONTRAST TECHNIQUE: Multiplanar, multiecho pulse sequences of the brain and surrounding structures were obtained without and with intravenous contrast. CONTRAST:  8mL MULTIHANCE GADOBENATE DIMEGLUMINE 529 MG/ML IV SOLN COMPARISON:  PET-CT 04/28/2018 FINDINGS: Brain: No abnormal enhancement identified. No midline shift, mass effect, or evidence of intracranial mass lesion. No dural thickening. Normal cerebral volume. No restricted diffusion to suggest acute infarction. No ventriculomegaly, extra-axial collection or acute intracranial hemorrhage. Cervicomedullary junction and pituitary are within normal limits. Pearline Cables and white matter signal is within normal limits for age throughout the brain. No cortical encephalomalacia or chronic cerebral blood products. Vascular: Major intracranial vascular flow voids are preserved. The distal right vertebral artery appears dominant. The major dural venous sinuses are enhancing and appear patent. Skull and upper cervical spine: Negative visible cervical spine. Visualized bone marrow signal is within normal limits. Sinuses/Orbits: Postoperative changes to the right globe. Otherwise normal orbits soft tissues. Paranasal sinuses and mastoids are well  pneumatized. Other: Visible internal auditory structures appear normal. Scalp and face soft tissues appear negative. IMPRESSION: No metastatic disease or acute intracranial abnormality. Normal for age MRI appearance of the brain. Electronically Signed   By: Genevie Ann M.D.   On: 05/08/2018 07:18   Nm Pet Image Initial (pi) Skull Base To Thigh  Result Date: 04/28/2018 CLINICAL DATA:  Initial treatment strategy for right middle lobe lung mass. EXAM: NUCLEAR MEDICINE PET SKULL BASE TO THIGH TECHNIQUE: 8.8 mCi F-18 FDG was injected intravenously. Full-ring PET imaging was performed from the skull base to thigh after the radiotracer. CT data was obtained and used for attenuation correction and anatomic localization. Fasting blood glucose: 183 mg/dl COMPARISON:  04/06/2018 chest CT. FINDINGS: Mediastinal blood pool activity: SUV max 2.5 NECK: Mildly  enlarged hypermetabolic 1.0 cm lymph node in the right lower neck at the thoracic inlet (series 4/image 41) with max SUV 3.7. No additional pathologically enlarged lymph nodes in the neck. Incidental CT findings: none CHEST: Intensely hypermetabolic 5.5 cm right middle lobe lung mass with max SUV 15.7 (series 4/image 71). There is postobstructive atelectasis throughout the peripheral right middle lobe. Hypermetabolic right hilar adenopathy with max SUV 6.3. Low-level hypermetabolism within left hilar nodes with max SUV 3.7. Mildly hypermetabolic mildly enlarged 1.0 cm subcarinal node with max SUV 4.2 (series 4/image 65). Mildly enlarged hypermetabolic right low paratracheal 1.0 cm node with max SUV 5.5 (series 4/image 61). Enlarged hypermetabolic high right paraesophageal mediastinal 1.5 cm node with max SUV 5.6 (series 4/image 48). No hypermetabolic axillary nodes. Incidental CT findings: Small dependent right pleural effusion. Posterior right upper lobe 5 mm solid pulmonary nodule (series 8/image 20), below PET resolution, new since 04/06/2018 CT. No additional significant  pulmonary nodules. Atherosclerotic nonaneurysmal abdominal aorta. Coronary atherosclerosis. Top-normal heart size. Mild centrilobular emphysema. ABDOMEN/PELVIS: No abnormal hypermetabolic activity within the liver, pancreas, adrenal glands, or spleen. No hypermetabolic lymph nodes in the abdomen or pelvis. Incidental CT findings: Cholecystectomy. Atherosclerotic nonaneurysmal abdominal aorta. Mild sigmoid diverticulosis. SKELETON: No focal hypermetabolic activity to suggest skeletal metastasis. Incidental CT findings: none IMPRESSION: 1. Intensely hypermetabolic 5.5 cm right middle lobe lung mass, compatible with primary bronchogenic carcinoma. Right middle lobe postobstructive atelectasis. 2. New 5 mm posterior right upper lobe pulmonary nodule, below PET resolution, recommend attention on follow-up chest CT in 3 months. 3. Small dependent right pleural effusion. 4. Hypermetabolic ipsilateral and contralateral hilar, subcarinal, right paratracheal, high right paraesophageal and right thoracic inlet adenopathy. 5. Otherwise no hypermetabolic extrathoracic or osseous metastatic disease. 6. Aortic Atherosclerosis (ICD10-I70.0) and Emphysema (ICD10-J43.9). Electronically Signed   By: Ilona Sorrel M.D.   On: 04/28/2018 11:41   Dg Chest Port 1 View  Result Date: 04/27/2018 CLINICAL DATA:  Followup bronchoscopy. EXAM: PORTABLE CHEST 1 VIEW COMPARISON:  04/25/2018 FINDINGS: Heart size is unchanged. Aortic atherosclerosis as seen previously. Interstitial prominence that could reflect mild interstitial edema. Airspace filling in the right lower lung, slightly worsened, possibly secondary to postprocedure hemorrhage or lavage. No pneumothorax or hemothorax. IMPRESSION: Increased alveolar filling in the right lower lung. This could be due to postprocedure hemorrhage or lavage. Mild interstitial prominence increase which could be due to mild edema. Electronically Signed   By: Nelson Chimes M.D.   On: 04/27/2018 11:02   Mm  3d Screen Breast Bilateral  Result Date: 05/04/2018 CLINICAL DATA:  Screening. EXAM: DIGITAL SCREENING BILATERAL MAMMOGRAM WITH TOMO AND CAD COMPARISON:  Previous exam(s). ACR Breast Density Category b: There are scattered areas of fibroglandular density. FINDINGS: There are no findings suspicious for malignancy. Images were processed with CAD. IMPRESSION: No mammographic evidence of malignancy. A result letter of this screening mammogram will be mailed directly to the patient. RECOMMENDATION: Screening mammogram in one year. (Code:[id number]-B-01Y) BI-RADS CATEGORY  1: Negative. Electronically Signed   By: Ammie Ferrier M.D.   On: 05/04/2018 09:58   US Thoracentesis Asp Pleural Space W/img Guide  Result Date: 05/08/2018 INDICATION: Patient with non-small cell lung cancer. Now with right pleural effusion. Request is made for diagnostic and therapeutic thoracentesis. EXAM: ULTRASOUND GUIDED DIAGNOSTIC AND THERAPEUTIC RIGHT THORACENTESIS MEDICATIONS: 10 mL 1% lidocaine COMPLICATIONS: None immediate. PROCEDURE: An ultrasound guided thoracentesis was thoroughly discussed with the patient and questions answered. The benefits, risks, alternatives and complications were also discussed. The patient understands and wishes  to proceed with the procedure. Written consent was obtained. Ultrasound was performed to localize and mark an adequate pocket of fluid in the right chest. The area was then prepped and draped in the normal sterile fashion. 1% Lidocaine was used for local anesthesia. Under ultrasound guidance a 6 Fr Safe-T-Centesis catheter was introduced. Thoracentesis was performed. The catheter was removed and a dressing applied. FINDINGS: A total of approximately 450 mL of clear, yellow fluid was removed. Samples were sent to the laboratory as requested by the clinical team. IMPRESSION: Successful ultrasound guided diagnostic and therapeutic right thoracentesis yielding 450 mL of pleural fluid. Read by: Brynda Greathouse  PA-C Electronically Signed   By: Sandi Mariscal M.D.   On: 05/08/2018 12:33    Labs:  CBC: Recent Labs    03/20/18 1206 04/25/18 1544 05/04/18 1225 05/18/18 0615  WBC 5.0 3.3* 4.2 4.3  HGB 9.5* 8.0* 8.7* 8.5*  HCT 30.0* 27.4* 28.1* 29.7*  PLT 259.0 229 262 297    COAGS: Recent Labs    04/25/18 1544 05/18/18 0615  INR 1.15 1.09  APTT 30  --     BMP: Recent Labs    03/20/18 1206 04/25/18 1544 05/04/18 1225  NA 136 140 141  K 3.7 3.2* 3.7  CL 103 108 107  CO2 27 22 27   GLUCOSE 208* 121* 169*  BUN 12 11 9   CALCIUM 9.3 8.7* 9.0  CREATININE 0.88 0.89 0.91  GFRNONAA  --  >60 >60  GFRAA  --  >60 >60    LIVER FUNCTION TESTS: Recent Labs    03/20/18 1206 04/25/18 1544 05/04/18 1225  BILITOT 0.3 0.3 0.3  AST 12 27 26   ALT 8 23 12   ALKPHOS 86 112 123  PROT 7.9 7.3 7.8  ALBUMIN 3.6 3.1* 3.0*    TUMOR MARKERS: No results for input(s): AFPTM, CEA, CA199, CHROMGRNA in the last 8760 hours.  Assessment and Plan: Patient with past medical history of DM, HTN presents with complaint of newly diagnosed lung cancer with lymphadenopathy.  IR consulted for lymph node biopsy at the request of Dr. Earlie Server. Case reviewed by Dr. Earleen Newport who approves patient for procedure.  Patient presents today in their usual state of health.  She has been NPO and is not currently on blood thinners.    Risks and benefits discussed with the patient including, but not limited to bleeding, infection, damage to adjacent structures or low yield requiring additional tests.  All of the patient's questions were answered, patient is agreeable to proceed. Consent signed and in chart.  Thank you for this interesting consult.  I greatly enjoyed meeting Jenna [name] and look forward to participating in their care.  A copy of this report was sent to the requesting provider on this date.  Electronically Signed: Docia Barrier, PA 05/18/2018, 7:59 AM   I spent a total of  30 Minutes    in face to face in clinical consultation, greater than 50% of which was counseling/coordinating care for lymphadenopathy.

## 2018-05-18 NOTE — Discharge Instructions (Addendum)

## 2018-05-19 DIAGNOSIS — C342 Malignant neoplasm of middle lobe, bronchus or lung: Secondary | ICD-10-CM | POA: Diagnosis not present

## 2018-05-19 DIAGNOSIS — Z510 Encounter for antineoplastic radiation therapy: Secondary | ICD-10-CM | POA: Diagnosis not present

## 2018-05-22 ENCOUNTER — Ambulatory Visit: Payer: Medicare Other | Admitting: Radiation Oncology

## 2018-05-22 ENCOUNTER — Other Ambulatory Visit: Payer: Medicare Other

## 2018-05-22 ENCOUNTER — Encounter: Payer: Self-pay | Admitting: Internal Medicine

## 2018-05-22 ENCOUNTER — Ambulatory Visit: Payer: Medicare Other

## 2018-05-22 NOTE — Progress Notes (Signed)
Unfortunately there aren't any foundations offering copay assistance for her Dx or the type of ins she has so I reached out to [place] in the radiation department requesting they reach out to the pt informing her of the Okay to assist w/ personal bills and transportation by way of gas cards.

## 2018-05-23 ENCOUNTER — Ambulatory Visit: Admission: RE | Admit: 2018-05-23 | Payer: Medicare Other | Source: Ambulatory Visit | Admitting: Radiation Oncology

## 2018-05-23 ENCOUNTER — Telehealth: Payer: Self-pay | Admitting: Radiation Oncology

## 2018-05-23 NOTE — Telephone Encounter (Signed)
Spoke with patient and her radiation is still on for this afternoon - I have scheduled her rides for her treatment and confirmed them with the patient.

## 2018-05-23 NOTE — Telephone Encounter (Signed)
I called the patient to let her know we would start today. Her biopsy was negative. She will start chemo next week.

## 2018-05-24 ENCOUNTER — Ambulatory Visit
Admission: RE | Admit: 2018-05-24 | Discharge: 2018-05-24 | Disposition: A | Discharge status: Home / Self Care | Payer: Medicare Other | Source: Ambulatory Visit | Attending: Radiation Oncology | Admitting: Radiation Oncology

## 2018-05-24 DIAGNOSIS — C342 Malignant neoplasm of middle lobe, bronchus or lung: Secondary | ICD-10-CM | POA: Diagnosis not present

## 2018-05-24 DIAGNOSIS — Z510 Encounter for antineoplastic radiation therapy: Secondary | ICD-10-CM | POA: Diagnosis not present

## 2018-05-25 ENCOUNTER — Ambulatory Visit
Admission: RE | Admit: 2018-05-25 | Discharge: 2018-05-25 | Disposition: A | Discharge status: Home / Self Care | Payer: Medicare Other | Source: Ambulatory Visit | Attending: Radiation Oncology | Admitting: Radiation Oncology

## 2018-05-25 DIAGNOSIS — C342 Malignant neoplasm of middle lobe, bronchus or lung: Secondary | ICD-10-CM | POA: Diagnosis not present

## 2018-05-25 DIAGNOSIS — Z510 Encounter for antineoplastic radiation therapy: Secondary | ICD-10-CM | POA: Diagnosis not present

## 2018-05-25 LAB — FUNGUS CULTURE RESULT

## 2018-05-25 LAB — FUNGUS CULTURE WITH STAIN

## 2018-05-25 LAB — FUNGAL ORGANISM REFLEX

## 2018-05-26 ENCOUNTER — Ambulatory Visit
Admission: RE | Admit: 2018-05-26 | Discharge: 2018-05-26 | Disposition: A | Discharge status: Home / Self Care | Payer: Medicare Other | Source: Ambulatory Visit | Attending: Radiation Oncology | Admitting: Radiation Oncology

## 2018-05-26 DIAGNOSIS — Z510 Encounter for antineoplastic radiation therapy: Secondary | ICD-10-CM | POA: Diagnosis not present

## 2018-05-26 DIAGNOSIS — C342 Malignant neoplasm of middle lobe, bronchus or lung: Secondary | ICD-10-CM | POA: Diagnosis not present

## 2018-05-26 MED ORDER — SONAFINE EX EMUL
1.0000 "application " | Freq: Two times a day (BID) | CUTANEOUS | Status: DC
  Administered 2018-05-26: 1 via TOPICAL

## 2018-05-26 NOTE — Progress Notes (Signed)
Pt here for patient teaching.  Pt given Radiation and You booklet, skin care instructions and Sonafine.  Reviewed areas of pertinence such as fatigue, skin changes, throat changes, cough and shortness of breath . Pt able to give teach back of to pat skin and use unscented/gentle soap,apply Sonafine bid and avoid applying anything to skin within 4 hours of treatment. Pt demonstrated understanding, of information given and will contact nursing with any questions or concerns.     [email or web address]

## 2018-05-29 ENCOUNTER — Inpatient Hospital Stay (HOSPITAL_BASED_OUTPATIENT_CLINIC_OR_DEPARTMENT_OTHER): Payer: Medicare Other | Admitting: Oncology

## 2018-05-29 ENCOUNTER — Inpatient Hospital Stay: Payer: Medicare Other

## 2018-05-29 ENCOUNTER — Encounter: Payer: Self-pay | Admitting: Oncology

## 2018-05-29 ENCOUNTER — Encounter: Payer: Self-pay | Admitting: *Deleted

## 2018-05-29 ENCOUNTER — Ambulatory Visit
Admission: RE | Admit: 2018-05-29 | Discharge: 2018-05-29 | Disposition: A | Discharge status: Home / Self Care | Payer: Medicare Other | Source: Ambulatory Visit | Attending: Radiation Oncology | Admitting: Radiation Oncology

## 2018-05-29 ENCOUNTER — Inpatient Hospital Stay: Payer: Medicare Other | Attending: Internal Medicine

## 2018-05-29 VITALS — BP 150/54 | HR 92 | Temp 98.4°F | Resp 17 | Ht 65.0 in | Wt 164.7 lb

## 2018-05-29 VITALS — BP 166/68 | HR 78 | Temp 98.9°F | Resp 18

## 2018-05-29 DIAGNOSIS — R634 Abnormal weight loss: Secondary | ICD-10-CM | POA: Diagnosis not present

## 2018-05-29 DIAGNOSIS — D649 Anemia, unspecified: Secondary | ICD-10-CM

## 2018-05-29 DIAGNOSIS — Z5111 Encounter for antineoplastic chemotherapy: Secondary | ICD-10-CM

## 2018-05-29 DIAGNOSIS — Q2733 Arteriovenous malformation of digestive system vessel: Secondary | ICD-10-CM

## 2018-05-29 DIAGNOSIS — D509 Iron deficiency anemia, unspecified: Secondary | ICD-10-CM

## 2018-05-29 DIAGNOSIS — C342 Malignant neoplasm of middle lobe, bronchus or lung: Secondary | ICD-10-CM | POA: Diagnosis not present

## 2018-05-29 DIAGNOSIS — Z510 Encounter for antineoplastic radiation therapy: Secondary | ICD-10-CM | POA: Diagnosis not present

## 2018-05-29 LAB — CMP (CANCER CENTER ONLY)
ALBUMIN: 2.8 g/dL — AB (ref 3.5–5.0)
ALK PHOS: 119 U/L (ref 38–126)
ALT: 14 U/L (ref 0–44)
AST: 18 U/L (ref 15–41)
Anion gap: 10 (ref 5–15)
BILIRUBIN TOTAL: 0.3 mg/dL (ref 0.3–1.2)
BUN: 13 mg/dL (ref 8–23)
CALCIUM: 9.2 mg/dL (ref 8.9–10.3)
CO2: 23 mmol/L (ref 22–32)
CREATININE: 0.89 mg/dL (ref 0.44–1.00)
Chloride: 106 mmol/L (ref 98–111)
GFR, Est AFR Am: >60 mL/min (ref >60)
Glucose, Bld: 167 mg/dL — ABNORMAL HIGH (ref 70–99)
Potassium: 3.7 mmol/L (ref 3.5–5.1)
Sodium: 139 mmol/L (ref 135–145)
Total Protein: 8.2 g/dL — ABNORMAL HIGH (ref 6.5–8.1)

## 2018-05-29 LAB — CBC WITH DIFFERENTIAL (CANCER CENTER ONLY)
Abs Immature Granulocytes: 0.01 10*3/uL (ref 0.00–0.07)
BASOS ABS: 0 10*3/uL (ref 0.0–0.1)
Basophils Relative: 0 %
EOS PCT: 1 %
Eosinophils Absolute: 0 10*3/uL (ref 0.0–0.5)
HCT: 30.7 % — ABNORMAL LOW (ref 36.0–46.0)
HEMOGLOBIN: 8.9 g/dL — AB (ref 12.0–15.0)
IMMATURE GRANULOCYTES: 0 %
LYMPHS PCT: 6 %
Lymphs Abs: 0.3 10*3/uL — ABNORMAL LOW (ref 0.7–4.0)
MCH: 24.3 pg — ABNORMAL LOW (ref 26.0–34.0)
MCHC: 29 g/dL — ABNORMAL LOW (ref 30.0–36.0)
MCV: 83.7 fL (ref 80.0–100.0)
MONOS PCT: 4 %
Monocytes Absolute: 0.2 10*3/uL (ref 0.1–1.0)
NEUTROS PCT: 89 %
NRBC: 0 % (ref 0.0–0.2)
Neutro Abs: 4.1 10*3/uL (ref 1.7–7.7)
Platelet Count: 294 10*3/uL (ref 150–400)
RBC: 3.67 MIL/uL — ABNORMAL LOW (ref 3.87–5.11)
RDW: 16.9 % — ABNORMAL HIGH (ref 11.5–15.5)
WBC Count: 4.7 10*3/uL (ref 4.0–10.5)

## 2018-05-29 MED ORDER — PALONOSETRON HCL INJECTION 0.25 MG/5ML
INTRAVENOUS | Status: AC
  Filled 2018-05-29: qty 5

## 2018-05-29 MED ORDER — FAMOTIDINE IN NACL 20-0.9 MG/50ML-% IV SOLN
20.0000 mg | Freq: Once | INTRAVENOUS | Status: AC
  Administered 2018-05-29: 20 mg via INTRAVENOUS

## 2018-05-29 MED ORDER — DIPHENHYDRAMINE HCL 50 MG/ML IJ SOLN
INTRAMUSCULAR | Status: AC
  Filled 2018-05-29: qty 1

## 2018-05-29 MED ORDER — FAMOTIDINE IN NACL 20-0.9 MG/50ML-% IV SOLN
INTRAVENOUS | Status: AC
  Filled 2018-05-29: qty 50

## 2018-05-29 MED ORDER — PALONOSETRON HCL INJECTION 0.25 MG/5ML
0.2500 mg | Freq: Once | INTRAVENOUS | Status: AC
  Administered 2018-05-29: 0.25 mg via INTRAVENOUS

## 2018-05-29 MED ORDER — SODIUM CHLORIDE 0.9 % IV SOLN
20.0000 mg | Freq: Once | INTRAVENOUS | Status: AC
  Administered 2018-05-29: 20 mg via INTRAVENOUS
  Filled 2018-05-29: qty 2

## 2018-05-29 MED ORDER — DIPHENHYDRAMINE HCL 50 MG/ML IJ SOLN
50.0000 mg | Freq: Once | INTRAMUSCULAR | Status: AC
  Administered 2018-05-29: 50 mg via INTRAVENOUS

## 2018-05-29 MED ORDER — SODIUM CHLORIDE 0.9 % IV SOLN
Freq: Once | INTRAVENOUS | Status: AC
  Administered 2018-05-29: 15:00:00 via INTRAVENOUS
  Filled 2018-05-29: qty 250

## 2018-05-29 MED ORDER — SODIUM CHLORIDE 0.9 % IV SOLN
180.2000 mg | Freq: Once | INTRAVENOUS | Status: AC
  Administered 2018-05-29: 180 mg via INTRAVENOUS
  Filled 2018-05-29: qty 18

## 2018-05-29 MED ORDER — SODIUM CHLORIDE 0.9 % IV SOLN
45.0000 mg/m2 | Freq: Once | INTRAVENOUS | Status: AC
  Administered 2018-05-29: 84 mg via INTRAVENOUS
  Filled 2018-05-29: qty 14

## 2018-05-29 NOTE — Progress Notes (Signed)
[facility] OFFICE PROGRESS NOTE  [name], MD [place] [address]  DIAGNOSIS: Stage IIIb (T3, N3, M0) non-small cell lung cancer, squamous cell carcinoma presented with large right middle lobe lung breast in addition to mediastinal and bilateral hilar lymphadenopathy and suspicious right supraclavicular lymph node diagnosed in August 2019.   PRIOR THERAPY: None  CURRENT THERAPY: A course of concurrent chemoradiation with weekly carboplatin for AUC of 2 and paclitaxel 45 MG/M2.  First dose of chemotherapy given on 05/29/2018.  INTERVAL HISTORY: Jenna [name] 72 y.o. female returns for routine follow-up visit by herself.  The patient is feeling fine today and has no specific complaints that for decreased appetite and weight loss.  She denies fevers chills.  Denies chest pain, shortness of breath, cough, hemoptysis.  Denies nausea, vomiting, constipation, diarrhea.  The patient had a recent MRI of the brain as well as an ultrasound-guided thoracentesis and an ultrasound-guided biopsy of the right supraclavicular lymph node.  She is here to discuss the results and to start her first dose of chemotherapy.  MEDICAL HISTORY: Past Medical History:  Diagnosis Date  . DM (diabetes mellitus) ([place])   . GERD (gastroesophageal reflux disease)   . Hypertension   . Iron deficiency anemia     ALLERGIES:  is allergic to aspirin; esomeprazole magnesium; and ace inhibitors.  MEDICATIONS:  Current Outpatient Medications  Medication Sig Dispense Refill  . dexlansoprazole (DEXILANT) 60 MG capsule 1 BY MOUTH 30 MIN BEFORE BREAKFAST DAILY (Patient taking differently: Take 60 mg by mouth See admin instructions. 1 BY MOUTH 30 MIN BEFORE BREAKFAST DAILY) 30 capsule 11  . diphenhydrAMINE (BENADRYL) 2 % cream Apply 1 application topically as needed for itching.    . docusate sodium (COLACE) 100 MG capsule Take 100 mg by mouth daily as needed for mild constipation.     . ferrous sulfate 325 (65 FE) MG tablet Take 325 mg by mouth 2 (two) times daily with a meal.     . glipiZIDE (GLUCOTROL) 10 MG tablet Take 1 tablet (10 mg total) by mouth 2 (two) times daily before a meal. 60 tablet 3  . labetalol (NORMODYNE) 100 MG tablet Take 1 tablet (100 mg total) by mouth 2 (two) times daily. 180 tablet 0  . Multiple Vitamin (MULTIVITAMIN WITH MINERALS) TABS tablet Take 1 tablet by mouth at bedtime.     . prochlorperazine (COMPAZINE) 10 MG tablet Take 1 tablet (10 mg total) by mouth every 6 (six) hours as needed for nausea or vomiting. 30 tablet 0  . simvastatin (ZOCOR) 20 MG tablet Take 20 mg by mouth every evening.     No current facility-administered medications for this visit.    Facility-Administered Medications Ordered in Other Visits  Medication Dose Route Frequency Provider Last Rate Last Dose  . CARBOplatin (PARAPLATIN) 180 mg in sodium chloride 0.9 % 250 mL chemo infusion  180 mg Intravenous Once Curt Bears, MD      . PACLitaxel (TAXOL) 84 mg in sodium chloride 0.9 % 250 mL chemo infusion (</= 80mg /m2)  45 mg/m2 (Treatment Plan Recorded) Intravenous Once Curt Bears, MD 66 mL/hr at 05/29/18 1605 84 mg at 05/29/18 1605    SURGICAL HISTORY:  Past Surgical History:  Procedure Laterality Date  . CATARACT EXTRACTION Right   . CHOLECYSTECTOMY    . COLONOSCOPY  10/24/2009   normal rectum/1X1cm abnormal lesion in the ascending colon (bx benign). TI normal for 10cm.  Prep difficult/inadequate. f/u TCS 09/2012  recommended  . COLONOSCOPY  10/13/2004   Normal rectum/Diminutive polyps, splenic flexure, cold biopsied/removed.  Remainder of colonic mucosa appeared normal.  . COLONOSCOPY N/A 12/14/2012   XTG:GYIRSWN polyp-tubular adenoma  . ESOPHAGOGASTRODUODENOSCOPY  10/13/2004    Normal esophagus/ Nodular volcano like lesion in the antrum, either representing a  pancreatic rest or leiomyoma, biopsied.  Remainder of the gastric mucosa appeared normal, normal  D1-D2  . ESOPHAGOGASTRODUODENOSCOPY  10/24/2009   Benign biopsies. normal esophagus/small hiatal hernia/nodular lesion antrum/distal greater curvature. duodenal AVM s/p ablation  . GIVENS CAPSULE STUDY  07/27/2010    multiple arteriovenous malformations which could definitely be the contributor to her drifting hemoglobin and hematocrit  . TUBAL LIGATION    . VIDEO BRONCHOSCOPY WITH ENDOBRONCHIAL ULTRASOUND N/A 04/27/2018   Procedure: VIDEO BRONCHOSCOPY WITH ENDOBRONCHIAL ULTRASOUND;  Surgeon: Melrose Nakayama, MD;  Location: [facility] OR;  Service: Thoracic;  Laterality: N/A;    REVIEW OF SYSTEMS:   Review of Systems  Constitutional: Negative for chills, fatigue, fever.  Positive for decreased appetite and weight loss. HENT:   Negative for mouth sores, nosebleeds, sore throat and trouble swallowing.   Eyes: Negative for eye problems and icterus.  Respiratory: Negative for cough, hemoptysis, shortness of breath and wheezing.   Cardiovascular: Negative for chest pain and leg swelling.  Gastrointestinal: Negative for abdominal pain, constipation, diarrhea, nausea and vomiting.  Genitourinary: Negative for bladder incontinence, difficulty urinating, dysuria, frequency and hematuria.   Musculoskeletal: Negative for back pain, gait problem, neck pain and neck stiffness.  Skin: Negative for itching and rash.  Neurological: Negative for dizziness, extremity weakness, gait problem, headaches, light-headedness and seizures.  Hematological: Negative for adenopathy. Does not bruise/bleed easily.  Psychiatric/Behavioral: Negative for confusion, depression and sleep disturbance. The patient is not nervous/anxious.     PHYSICAL EXAMINATION:  Blood pressure (!) 150/54, pulse 92, temperature 98.4 F (36.9 C), temperature source Oral, resp. rate 17, height 5\' 5"  (1.651 m), weight 164 lb 11.2 oz (74.7 kg), SpO2 100 %.  ECOG PERFORMANCE STATUS: 1 - Symptomatic but completely ambulatory  Physical Exam   Constitutional: Oriented to person, place, and time and well-developed, well-nourished, and in no distress. No distress.  HENT:  Head: Normocephalic and atraumatic.  Mouth/Throat: Oropharynx is clear and moist. No oropharyngeal exudate.  Eyes: Conjunctivae are normal. Right eye exhibits no discharge. Left eye exhibits no discharge. No scleral icterus.  Neck: Normal range of motion. Neck supple.  Cardiovascular: Normal rate, regular rhythm, normal heart sounds and intact distal pulses.   Pulmonary/Chest: Effort normal and breath sounds normal. No respiratory distress. No wheezes. No rales.  Abdominal: Soft. Bowel sounds are normal. Exhibits no distension and no mass. There is no tenderness.  Musculoskeletal: Normal range of motion. Exhibits no edema.  Lymphadenopathy:    No cervical adenopathy.  Neurological: Alert and oriented to person, place, and time. Exhibits normal muscle tone. Gait normal. Coordination normal.  Skin: Skin is warm and dry. No rash noted. Not diaphoretic. No erythema. No pallor.  Psychiatric: Mood, memory and judgment normal.  Vitals reviewed.  LABORATORY DATA: Lab Results  Component Value Date   WBC 4.7 05/29/2018   HGB 8.9 (L) 05/29/2018   HCT 30.7 (L) 05/29/2018   MCV 83.7 05/29/2018   PLT 294 05/29/2018      Chemistry      Component Value Date/Time   NA 139 05/29/2018 1313   K 3.7 05/29/2018 1313   CL 106 05/29/2018 1313   CO2 23 05/29/2018 1313   BUN  13 05/29/2018 1313   BUN 12 12/10/2011 0919   CREATININE 0.89 05/29/2018 1313   CREATININE 0.72 12/10/2011 0919      Component Value Date/Time   CALCIUM 9.2 05/29/2018 1313   ALKPHOS 119 05/29/2018 1313   ALKPHOS 88 12/10/2011 0919   AST 18 05/29/2018 1313   ALT 14 05/29/2018 1313   BILITOT 0.3 05/29/2018 1313       RADIOGRAPHIC STUDIES:  Dg Chest 1 View  Result Date: 05/08/2018 CLINICAL DATA:  Post right-sided thoracentesis. EXAM: CHEST  1 VIEW COMPARISON:  04/27/2018; PET-CT-04/28/2018  FINDINGS: Grossly unchanged cardiac silhouette and mediastinal contours. Interval reduction/resolution trace right-sided pleural effusion post thoracentesis. No pneumothorax. Improved aeration of the bilateral lung bases with persistent consolidative opacity encompassing the majority of the right middle lobe as demonstrated on preceding PET-CT. No evidence of edema.  No acute osseus abnormalities. IMPRESSION: 1. Interval reduction/resolution of trace right-sided pleural effusion post thoracentesis. No pneumothorax. 2. Improved aeration the lungs with persistent consolidative opacities within the right middle lobe as demonstrated on preceding PET-CT. Electronically Signed   By: Sandi Mariscal M.D.   On: 05/08/2018 11:27   Mr Jeri Cos VQ Contrast  Result Date: 05/08/2018 CLINICAL DATA:  72 year old female with recently diagnosed hypermetabolic right lung mass. Staging. EXAM: MRI HEAD WITHOUT AND WITH CONTRAST TECHNIQUE: Multiplanar, multiecho pulse sequences of the brain and surrounding structures were obtained without and with intravenous contrast. CONTRAST:  34mL MULTIHANCE GADOBENATE DIMEGLUMINE 529 MG/ML IV SOLN COMPARISON:  PET-CT 04/28/2018 FINDINGS: Brain: No abnormal enhancement identified. No midline shift, mass effect, or evidence of intracranial mass lesion. No dural thickening. Normal cerebral volume. No restricted diffusion to suggest acute infarction. No ventriculomegaly, extra-axial collection or acute intracranial hemorrhage. Cervicomedullary junction and pituitary are within normal limits. Pearline Cables and white matter signal is within normal limits for age throughout the brain. No cortical encephalomalacia or chronic cerebral blood products. Vascular: Major intracranial vascular flow voids are preserved. The distal right vertebral artery appears dominant. The major dural venous sinuses are enhancing and appear patent. Skull and upper cervical spine: Negative visible cervical spine. Visualized bone marrow  signal is within normal limits. Sinuses/Orbits: Postoperative changes to the right globe. Otherwise normal orbits soft tissues. Paranasal sinuses and mastoids are well pneumatized. Other: Visible internal auditory structures appear normal. Scalp and face soft tissues appear negative. IMPRESSION: No metastatic disease or acute intracranial abnormality. Normal for age MRI appearance of the brain. Electronically Signed   By: Genevie Ann M.D.   On: 05/08/2018 07:18   Mm 3d Screen Breast Bilateral  Result Date: 05/04/2018 CLINICAL DATA:  Screening. EXAM: DIGITAL SCREENING BILATERAL MAMMOGRAM WITH TOMO AND CAD COMPARISON:  Previous exam(s). ACR Breast Density Category b: There are scattered areas of fibroglandular density. FINDINGS: There are no findings suspicious for malignancy. Images were processed with CAD. IMPRESSION: No mammographic evidence of malignancy. A result letter of this screening mammogram will be mailed directly to the patient. RECOMMENDATION: Screening mammogram in one year. (Code:SM-B-01Y) BI-RADS CATEGORY  1: Negative. Electronically Signed   By: Ammie Ferrier M.D.   On: 05/04/2018 09:58   [place] Core Biopsy (lymph Nodes)  Result Date: 05/18/2018 INDICATION: Hypermetabolic right thoracic inlet lymph node. EXAM: ULTRASOUND GUIDED CORE BIOPSY OF RIGHT THORACIC INLET LYMPH NODE MEDICATIONS: None. ANESTHESIA/SEDATION: Fentanyl 50 mcg IV; Versed 1.5 mg IV Moderate Sedation Time:  10 The patient was continuously monitored during the procedure by the interventional radiology nurse under my direct supervision. PROCEDURE: The procedure, risks, benefits, and alternatives  were explained to the patient. Questions regarding the procedure were encouraged and answered. The patient understands and consents to the procedure. The right neck was prepped with ChloraPrep in a sterile fashion, and a sterile drape was applied covering the operative field. A sterile gown and sterile gloves were used for the procedure.  Local anesthesia was provided with 1% Lidocaine. Under sonographic guidance, 2 18 gauge core biopsies of the abnormal right thoracic inlet lymph node were obtained. COMPLICATIONS: None immediate. FINDINGS: Imaging documents needle placement in the right thoracic inlet lymph node. IMPRESSION: Successful core biopsy of a right thoracic inlet lymph node. Electronically Signed   By: Marybelle Killings M.D.   On: 05/18/2018 09:41   US Thoracentesis Asp Pleural Space W/img Guide  Result Date: 05/08/2018 INDICATION: Patient with non-small cell lung cancer. Now with right pleural effusion. Request is made for diagnostic and therapeutic thoracentesis. EXAM: ULTRASOUND GUIDED DIAGNOSTIC AND THERAPEUTIC RIGHT THORACENTESIS MEDICATIONS: 10 mL 1% lidocaine COMPLICATIONS: None immediate. PROCEDURE: An ultrasound guided thoracentesis was thoroughly discussed with the patient and questions answered. The benefits, risks, alternatives and complications were also discussed. The patient understands and wishes to proceed with the procedure. Written consent was obtained. Ultrasound was performed to localize and mark an adequate pocket of fluid in the right chest. The area was then prepped and draped in the normal sterile fashion. 1% Lidocaine was used for local anesthesia. Under ultrasound guidance a 6 Fr Safe-T-Centesis catheter was introduced. Thoracentesis was performed. The catheter was removed and a dressing applied. FINDINGS: A total of approximately 450 mL of clear, yellow fluid was removed. Samples were sent to the laboratory as requested by the clinical team. IMPRESSION: Successful ultrasound guided diagnostic and therapeutic right thoracentesis yielding 450 mL of pleural fluid. Read by: Brynda Greathouse PA-C Electronically Signed   By: Sandi Mariscal M.D.   On: 05/08/2018 12:33     ASSESSMENT/PLAN:  Malignant neoplasm of middle lobe of right lung [facility]) This is a very pleasant 72 year old African-American female with likely stage  IIIb (T3, N3, M0) non-small cell lung cancer, squamous cell carcinoma presented with large right middle lobe lung breast in addition to mediastinal and bilateral hilar lymphadenopathy and suspicious right supraclavicular lymph node diagnosed in August 2019.   She had a recent MRI of the brain, ultrasound-guided thoracentesis, and ultrasound guided core biopsy performed.  We discussed the MRI of the brain did not show any evidence of metastatic disease.  We also discussed that the fluid from the thoracentesis was sent for a biopsy and was negative for malignancy.  We discussed that the biopsy of the right supraclavicular lymph node did not show any evidence of cancer.  Recommend that she proceed with her concurrent chemoradiation as previously outlined by Dr. Julien Nordmann. The patient will proceed with cycle #1 of her weekly carboplatin for AUC of 2 and paclitaxel 45 MG/M2 today as scheduled.  I again reviewed the adverse effect of the chemotherapy including but not limited to alopecia, myelosuppression, nausea and vomiting, peripheral neuropathy, liver or renal dysfunction.  The patient is agreeable to proceeding.  She will follow-up weekly for labs and chemotherapy.  She will have a follow-up visit in 2 weeks for evaluation prior to cycle #3.  For her weight loss, I have referred her to the dietitian.  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  Her hemoglobin is stable today.  We will continue to watch this closely.  She was advised to continue to take her oral iron  on a regular basis.  She was advised to call immediately if she has any concerning symptoms in the interval.  The patient voices understanding of current disease status and treatment options and is in agreement with the current care plan.  All questions were answered. The patient knows to call the clinic with any problems, questions or concerns. We can certainly see the patient much sooner if necessary.   Orders Placed This Encounter   Procedures  . CBC with Differential (Cancer Center Only)    Standing Status:   Standing    Number of Occurrences:   20    Standing Expiration Date:   05/30/2019  . CMP ([facility] only)    Standing Status:   Standing    Number of Occurrences:   20    Standing Expiration Date:   05/30/2019  . Amb Referral to Nutrition and Diabetic E    Referral Priority:   Routine    Referral Type:   Consultation    Referral Reason:   Specialty Services Required    Number of Visits Requested:   [place], DNP, AGPCNP-BC, AOCNP 05/29/18

## 2018-05-29 NOTE — Assessment & Plan Note (Addendum)
This is a very pleasant 72 year old African-American female with likely stage IIIb (T3, N3, M0) non-small cell lung cancer, squamous cell carcinoma presented with large right middle lobe lung breast in addition to mediastinal and bilateral hilar lymphadenopathy and suspicious right supraclavicular lymph node diagnosed in August 2019.   She had a recent MRI of the brain, ultrasound-guided thoracentesis, and ultrasound guided core biopsy performed.  We discussed the MRI of the brain did not show any evidence of metastatic disease.  We also discussed that the fluid from the thoracentesis was sent for a biopsy and was negative for malignancy.  We discussed that the biopsy of the right supraclavicular lymph node did not show any evidence of cancer.  Recommend that she proceed with her concurrent chemoradiation as previously outlined by Dr. Julien Nordmann. The patient will proceed with cycle #1 of her weekly carboplatin for AUC of 2 and paclitaxel 45 MG/M2 today as scheduled.  I again reviewed the adverse effect of the chemotherapy including but not limited to alopecia, myelosuppression, nausea and vomiting, peripheral neuropathy, liver or renal dysfunction.  The patient is agreeable to proceeding.  She will follow-up weekly for labs and chemotherapy.  She will have a follow-up visit in 2 weeks for evaluation prior to cycle #3.  For her weight loss, I have referred her to the dietitian.  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  Her hemoglobin is stable today.  We will continue to watch this closely.  She was advised to continue to take her oral iron on a regular basis.  She was advised to call immediately if she has any concerning symptoms in the interval.  The patient voices understanding of current disease status and treatment options and is in agreement with the current care plan.  All questions were answered. The patient knows to call the clinic with any problems, questions or concerns. We can certainly  see the patient much sooner if necessary.

## 2018-05-29 NOTE — Patient Instructions (Addendum)
[facility] Discharge Instructions for Patients Receiving Chemotherapy  Today you received the following chemotherapy agents Taxol and Carboplatin.  To help prevent nausea and vomiting after your treatment, we encourage you to take your nausea medication as directed.  If you develop nausea and vomiting that is not controlled by your nausea medication, call the clinic.   BELOW ARE SYMPTOMS THAT SHOULD BE REPORTED IMMEDIATELY:  *FEVER GREATER THAN 100.5 F  *CHILLS WITH OR WITHOUT FEVER  NAUSEA AND VOMITING THAT IS NOT CONTROLLED WITH YOUR NAUSEA MEDICATION  *UNUSUAL SHORTNESS OF BREATH  *UNUSUAL BRUISING OR BLEEDING  TENDERNESS IN MOUTH AND THROAT WITH OR WITHOUT PRESENCE OF ULCERS  *URINARY PROBLEMS  *BOWEL PROBLEMS  UNUSUAL RASH Items with * indicate a potential emergency and should be followed up as soon as possible.  Feel free to call the clinic should you have any questions or concerns. The clinic phone number is [phone].  Please show the [place] at check-in to the Emergency Department and triage nurse.  Paclitaxel injection What is this medicine? PACLITAXEL (PAK li TAX el) is a chemotherapy drug. It targets fast dividing cells, like cancer cells, and causes these cells to die. This medicine is used to treat ovarian cancer, breast cancer, and other cancers. This medicine may be used for other purposes; ask your health care provider or pharmacist if you have questions. COMMON BRAND NAME(S): Onxol, Taxol What should I tell my health care provider before I take this medicine? They need to know if you have any of these conditions: -blood disorders -irregular heartbeat -infection (especially a virus infection such as chickenpox, cold sores, or herpes) -liver disease -previous or ongoing radiation therapy -an unusual or allergic reaction to paclitaxel, alcohol, polyoxyethylated castor oil, other chemotherapy agents, other medicines, foods, dyes,  or preservatives -pregnant or trying to get pregnant -breast-feeding How should I use this medicine? This drug is given as an infusion into a vein. It is administered in a hospital or clinic by a specially trained health care professional. Talk to your pediatrician regarding the use of this medicine in children. Special care may be needed. Overdosage: If you think you have taken too much of this medicine contact a poison control center or emergency room at once. NOTE: This medicine is only for you. Do not share this medicine with others. What if I miss a dose? It is important not to miss your dose. Call your doctor or health care professional if you are unable to keep an appointment. What may interact with this medicine? Do not take this medicine with any of the following medications: -disulfiram -metronidazole This medicine may also interact with the following medications: -cyclosporine -diazepam -ketoconazole -medicines to increase blood counts like filgrastim, pegfilgrastim, sargramostim -other chemotherapy drugs like cisplatin, doxorubicin, epirubicin, etoposide, teniposide, vincristine -quinidine -testosterone -vaccines -verapamil Talk to your doctor or health care professional before taking any of these medicines: -acetaminophen -aspirin -ibuprofen -ketoprofen -naproxen This list may not describe all possible interactions. Give your health care provider a list of all the medicines, herbs, non-prescription drugs, or dietary supplements you use. Also tell them if you smoke, drink alcohol, or use illegal drugs. Some items may interact with your medicine. What should I watch for while using this medicine? Your condition will be monitored carefully while you are receiving this medicine. You will need important blood work done while you are taking this medicine. This medicine can cause serious allergic reactions. To reduce your risk you will need to  take other medicine(s) before  treatment with this medicine. If you experience allergic reactions like skin rash, itching or hives, swelling of the face, lips, or tongue, tell your doctor or health care professional right away. In some cases, you may be given additional medicines to help with side effects. Follow all directions for their use. This drug may make you feel generally unwell. This is not uncommon, as chemotherapy can affect healthy cells as well as cancer cells. Report any side effects. Continue your course of treatment even though you feel ill unless your doctor tells you to stop. Call your doctor or health care professional for advice if you get a fever, chills or sore throat, or other symptoms of a cold or flu. Do not treat yourself. This drug decreases your body's ability to fight infections. Try to avoid being around people who are sick. This medicine may increase your risk to bruise or bleed. Call your doctor or health care professional if you notice any unusual bleeding. Be careful brushing and flossing your teeth or using a toothpick because you may get an infection or bleed more easily. If you have any dental work done, tell your dentist you are receiving this medicine. Avoid taking products that contain aspirin, acetaminophen, ibuprofen, naproxen, or ketoprofen unless instructed by your doctor. These medicines may hide a fever. Do not become pregnant while taking this medicine. Women should inform their doctor if they wish to become pregnant or think they might be pregnant. There is a potential for serious side effects to an unborn child. Talk to your health care professional or pharmacist for more information. Do not breast-feed an infant while taking this medicine. Men are advised not to father a child while receiving this medicine. This product may contain alcohol. Ask your pharmacist or healthcare provider if this medicine contains alcohol. Be sure to tell all healthcare providers you are taking this medicine.  Certain medicines, like metronidazole and disulfiram, can cause an unpleasant reaction when taken with alcohol. The reaction includes flushing, headache, nausea, vomiting, sweating, and increased thirst. The reaction can last from 30 minutes to several hours. What side effects may I notice from receiving this medicine? Side effects that you should report to your doctor or health care professional as soon as possible: -allergic reactions like skin rash, itching or hives, swelling of the face, lips, or tongue -low blood counts - This drug may decrease the number of white blood cells, red blood cells and platelets. You may be at increased risk for infections and bleeding. -signs of infection - fever or chills, cough, sore throat, pain or difficulty passing urine -signs of decreased platelets or bleeding - bruising, pinpoint red spots on the skin, black, tarry stools, nosebleeds -signs of decreased red blood cells - unusually weak or tired, fainting spells, lightheadedness -breathing problems -chest pain -high or low blood pressure -mouth sores -nausea and vomiting -pain, swelling, redness or irritation at the injection site -pain, tingling, numbness in the hands or feet -slow or irregular heartbeat -swelling of the ankle, feet, hands Side effects that usually do not require medical attention (report to your doctor or health care professional if they continue or are bothersome): -bone pain -complete hair loss including hair on your head, underarms, pubic hair, eyebrows, and eyelashes -changes in the color of fingernails -diarrhea -loosening of the fingernails -loss of appetite -muscle or joint pain -red flush to skin -sweating This list may not describe all possible side effects. Call your doctor for medical advice about  side effects. You may report side effects to FDA at 1-800-FDA-1088. Where should I keep my medicine? This drug is given in a hospital or clinic and will not be stored at  home. NOTE: This sheet is a summary. It may not cover all possible information. If you have questions about this medicine, talk to your doctor, pharmacist, or health care provider.  2018 Elsevier/Gold Standard (2015-06-03 19:58:00)  Carboplatin injection What is this medicine? CARBOPLATIN (KAR boe pla tin) is a chemotherapy drug. It targets fast dividing cells, like cancer cells, and causes these cells to die. This medicine is used to treat ovarian cancer and many other cancers. This medicine may be used for other purposes; ask your health care provider or pharmacist if you have questions. COMMON BRAND NAME(S): Paraplatin What should I tell my health care provider before I take this medicine? They need to know if you have any of these conditions: -blood disorders -hearing problems -kidney disease -recent or ongoing radiation therapy -an unusual or allergic reaction to carboplatin, cisplatin, other chemotherapy, other medicines, foods, dyes, or preservatives -pregnant or trying to get pregnant -breast-feeding How should I use this medicine? This drug is usually given as an infusion into a vein. It is administered in a hospital or clinic by a specially trained health care professional. Talk to your pediatrician regarding the use of this medicine in children. Special care may be needed. Overdosage: If you think you have taken too much of this medicine contact a poison control center or emergency room at once. NOTE: This medicine is only for you. Do not share this medicine with others. What if I miss a dose? It is important not to miss a dose. Call your doctor or health care professional if you are unable to keep an appointment. What may interact with this medicine? -medicines for seizures -medicines to increase blood counts like filgrastim, pegfilgrastim, sargramostim -some antibiotics like amikacin, gentamicin, neomycin, streptomycin, tobramycin -vaccines Talk to your doctor or health  care professional before taking any of these medicines: -acetaminophen -aspirin -ibuprofen -ketoprofen -naproxen This list may not describe all possible interactions. Give your health care provider a list of all the medicines, herbs, non-prescription drugs, or dietary supplements you use. Also tell them if you smoke, drink alcohol, or use illegal drugs. Some items may interact with your medicine. What should I watch for while using this medicine? Your condition will be monitored carefully while you are receiving this medicine. You will need important blood work done while you are taking this medicine. This drug may make you feel generally unwell. This is not uncommon, as chemotherapy can affect healthy cells as well as cancer cells. Report any side effects. Continue your course of treatment even though you feel ill unless your doctor tells you to stop. In some cases, you may be given additional medicines to help with side effects. Follow all directions for their use. Call your doctor or health care professional for advice if you get a fever, chills or sore throat, or other symptoms of a cold or flu. Do not treat yourself. This drug decreases your body's ability to fight infections. Try to avoid being around people who are sick. This medicine may increase your risk to bruise or bleed. Call your doctor or health care professional if you notice any unusual bleeding. Be careful brushing and flossing your teeth or using a toothpick because you may get an infection or bleed more easily. If you have any dental work done, tell your dentist you  are receiving this medicine. Avoid taking products that contain aspirin, acetaminophen, ibuprofen, naproxen, or ketoprofen unless instructed by your doctor. These medicines may hide a fever. Do not become pregnant while taking this medicine. Women should inform their doctor if they wish to become pregnant or think they might be pregnant. There is a potential for serious  side effects to an unborn child. Talk to your health care professional or pharmacist for more information. Do not breast-feed an infant while taking this medicine. What side effects may I notice from receiving this medicine? Side effects that you should report to your doctor or health care professional as soon as possible: -allergic reactions like skin rash, itching or hives, swelling of the face, lips, or tongue -signs of infection - fever or chills, cough, sore throat, pain or difficulty passing urine -signs of decreased platelets or bleeding - bruising, pinpoint red spots on the skin, black, tarry stools, nosebleeds -signs of decreased red blood cells - unusually weak or tired, fainting spells, lightheadedness -breathing problems -changes in hearing -changes in vision -chest pain -high blood pressure -low blood counts - This drug may decrease the number of white blood cells, red blood cells and platelets. You may be at increased risk for infections and bleeding. -nausea and vomiting -pain, swelling, redness or irritation at the injection site -pain, tingling, numbness in the hands or feet -problems with balance, talking, walking -trouble passing urine or change in the amount of urine Side effects that usually do not require medical attention (report to your doctor or health care professional if they continue or are bothersome): -hair loss -loss of appetite -metallic taste in the mouth or changes in taste This list may not describe all possible side effects. Call your doctor for medical advice about side effects. You may report side effects to FDA at 1-800-FDA-1088. Where should I keep my medicine? This drug is given in a hospital or clinic and will not be stored at home. NOTE: This sheet is a summary. It may not cover all possible information. If you have questions about this medicine, talk to your doctor, pharmacist, or health care provider.  2018 Elsevier/Gold Standard (2007-11-07  14:38:05)   

## 2018-05-30 ENCOUNTER — Ambulatory Visit
Admission: RE | Admit: 2018-05-30 | Discharge: 2018-05-30 | Disposition: A | Discharge status: Home / Self Care | Payer: Medicare Other | Source: Ambulatory Visit | Attending: Radiation Oncology | Admitting: Radiation Oncology

## 2018-05-30 DIAGNOSIS — Z510 Encounter for antineoplastic radiation therapy: Secondary | ICD-10-CM | POA: Diagnosis not present

## 2018-05-30 DIAGNOSIS — C342 Malignant neoplasm of middle lobe, bronchus or lung: Secondary | ICD-10-CM | POA: Diagnosis not present

## 2018-05-31 ENCOUNTER — Ambulatory Visit
Admission: RE | Admit: 2018-05-31 | Discharge: 2018-05-31 | Disposition: A | Discharge status: Home / Self Care | Payer: Medicare Other | Source: Ambulatory Visit | Attending: Radiation Oncology | Admitting: Radiation Oncology

## 2018-05-31 DIAGNOSIS — C342 Malignant neoplasm of middle lobe, bronchus or lung: Secondary | ICD-10-CM | POA: Diagnosis not present

## 2018-05-31 DIAGNOSIS — Z510 Encounter for antineoplastic radiation therapy: Secondary | ICD-10-CM | POA: Diagnosis not present

## 2018-06-01 ENCOUNTER — Ambulatory Visit
Admission: RE | Admit: 2018-06-01 | Discharge: 2018-06-01 | Disposition: A | Discharge status: Home / Self Care | Payer: Medicare Other | Source: Ambulatory Visit | Attending: Radiation Oncology | Admitting: Radiation Oncology

## 2018-06-01 DIAGNOSIS — C342 Malignant neoplasm of middle lobe, bronchus or lung: Secondary | ICD-10-CM | POA: Diagnosis not present

## 2018-06-01 DIAGNOSIS — Z510 Encounter for antineoplastic radiation therapy: Secondary | ICD-10-CM | POA: Diagnosis not present

## 2018-06-02 ENCOUNTER — Ambulatory Visit
Admission: RE | Admit: 2018-06-02 | Discharge: 2018-06-02 | Disposition: A | Discharge status: Home / Self Care | Payer: Medicare Other | Source: Ambulatory Visit | Attending: Radiation Oncology | Admitting: Radiation Oncology

## 2018-06-02 DIAGNOSIS — C342 Malignant neoplasm of middle lobe, bronchus or lung: Secondary | ICD-10-CM | POA: Diagnosis not present

## 2018-06-02 DIAGNOSIS — Z510 Encounter for antineoplastic radiation therapy: Secondary | ICD-10-CM | POA: Diagnosis not present

## 2018-06-05 ENCOUNTER — Inpatient Hospital Stay: Payer: Medicare Other

## 2018-06-05 ENCOUNTER — Other Ambulatory Visit: Payer: Self-pay

## 2018-06-05 ENCOUNTER — Inpatient Hospital Stay (HOSPITAL_BASED_OUTPATIENT_CLINIC_OR_DEPARTMENT_OTHER): Payer: Medicare Other | Admitting: Medical

## 2018-06-05 ENCOUNTER — Other Ambulatory Visit: Payer: Self-pay | Admitting: Medical

## 2018-06-05 ENCOUNTER — Inpatient Hospital Stay: Payer: Medicare Other | Admitting: Nutrition

## 2018-06-05 ENCOUNTER — Ambulatory Visit
Admission: RE | Admit: 2018-06-05 | Discharge: 2018-06-05 | Disposition: A | Discharge status: Home / Self Care | Payer: Medicare Other | Source: Ambulatory Visit | Attending: Radiation Oncology | Admitting: Radiation Oncology

## 2018-06-05 ENCOUNTER — Telehealth: Payer: Self-pay | Admitting: Medical

## 2018-06-05 VITALS — BP 170/68 | HR 86 | Temp 98.9°F | Resp 18 | Ht 65.0 in | Wt 161.5 lb

## 2018-06-05 DIAGNOSIS — Z510 Encounter for antineoplastic radiation therapy: Secondary | ICD-10-CM | POA: Diagnosis not present

## 2018-06-05 DIAGNOSIS — Z5111 Encounter for antineoplastic chemotherapy: Secondary | ICD-10-CM | POA: Diagnosis not present

## 2018-06-05 DIAGNOSIS — C342 Malignant neoplasm of middle lobe, bronchus or lung: Secondary | ICD-10-CM | POA: Diagnosis not present

## 2018-06-05 DIAGNOSIS — T8090XA Unspecified complication following infusion and therapeutic injection, initial encounter: Secondary | ICD-10-CM | POA: Diagnosis not present

## 2018-06-05 DIAGNOSIS — R112 Nausea with vomiting, unspecified: Secondary | ICD-10-CM

## 2018-06-05 LAB — CMP (CANCER CENTER ONLY)
ALBUMIN: 2.6 g/dL — AB (ref 3.5–5.0)
ALK PHOS: 101 U/L (ref 38–126)
ALT: 9 U/L (ref 0–44)
AST: 12 U/L — ABNORMAL LOW (ref 15–41)
Anion gap: 9 (ref 5–15)
BILIRUBIN TOTAL: 0.3 mg/dL (ref 0.3–1.2)
BUN: 11 mg/dL (ref 8–23)
CALCIUM: 8.9 mg/dL (ref 8.9–10.3)
CO2: 25 mmol/L (ref 22–32)
CREATININE: 0.88 mg/dL (ref 0.44–1.00)
Chloride: 101 mmol/L (ref 98–111)
GFR, Estimated: >60 mL/min (ref >60)
GLUCOSE: 209 mg/dL — AB (ref 70–99)
Potassium: 3.8 mmol/L (ref 3.5–5.1)
SODIUM: 135 mmol/L (ref 135–145)
Total Protein: 7.4 g/dL (ref 6.5–8.1)

## 2018-06-05 LAB — CBC WITH DIFFERENTIAL (CANCER CENTER ONLY)
Abs Immature Granulocytes: 0.02 10*3/uL (ref 0.00–0.07)
Basophils Absolute: 0 10*3/uL (ref 0.0–0.1)
Basophils Relative: 0 %
EOS ABS: 0 10*3/uL (ref 0.0–0.5)
EOS PCT: 1 %
HEMATOCRIT: 27.6 % — AB (ref 36.0–46.0)
Hemoglobin: 8.1 g/dL — ABNORMAL LOW (ref 12.0–15.0)
Immature Granulocytes: 1 %
Lymphocytes Relative: 5 %
Lymphs Abs: 0.2 10*3/uL — ABNORMAL LOW (ref 0.7–4.0)
MCH: 24.3 pg — AB (ref 26.0–34.0)
MCHC: 29.3 g/dL — AB (ref 30.0–36.0)
MCV: 82.9 fL (ref 80.0–100.0)
MONOS PCT: 5 %
Monocytes Absolute: 0.1 10*3/uL (ref 0.1–1.0)
Neutro Abs: 2.6 10*3/uL (ref 1.7–7.7)
Neutrophils Relative %: 88 %
Platelet Count: 254 10*3/uL (ref 150–400)
RBC: 3.33 MIL/uL — ABNORMAL LOW (ref 3.87–5.11)
RDW: 16.5 % — AB (ref 11.5–15.5)
WBC Count: 3 10*3/uL — ABNORMAL LOW (ref 4.0–10.5)
nRBC: 0 % (ref 0.0–0.2)

## 2018-06-05 MED ORDER — DIPHENHYDRAMINE HCL 50 MG/ML IJ SOLN
INTRAMUSCULAR | Status: AC
  Filled 2018-06-05: qty 1

## 2018-06-05 MED ORDER — SODIUM CHLORIDE 0.9 % IV SOLN
20.0000 mg | Freq: Once | INTRAVENOUS | Status: AC
  Administered 2018-06-05: 20 mg via INTRAVENOUS
  Filled 2018-06-05: qty 2

## 2018-06-05 MED ORDER — PALONOSETRON HCL INJECTION 0.25 MG/5ML
0.2500 mg | Freq: Once | INTRAVENOUS | Status: AC
  Administered 2018-06-05: 0.25 mg via INTRAVENOUS

## 2018-06-05 MED ORDER — PROMETHAZINE HCL 25 MG/ML IJ SOLN
INTRAMUSCULAR | Status: AC
  Filled 2018-06-05: qty 1

## 2018-06-05 MED ORDER — SODIUM CHLORIDE 0.9 % IV SOLN
180.2000 mg | Freq: Once | INTRAVENOUS | Status: AC
  Administered 2018-06-05: 180 mg via INTRAVENOUS
  Filled 2018-06-05: qty 18

## 2018-06-05 MED ORDER — FAMOTIDINE IN NACL 20-0.9 MG/50ML-% IV SOLN
20.0000 mg | Freq: Once | INTRAVENOUS | Status: AC
  Administered 2018-06-05: 20 mg via INTRAVENOUS

## 2018-06-05 MED ORDER — PALONOSETRON HCL INJECTION 0.25 MG/5ML
INTRAVENOUS | Status: AC
  Filled 2018-06-05: qty 5

## 2018-06-05 MED ORDER — ATROPINE SULFATE 1 MG/ML IJ SOLN
INTRAMUSCULAR | Status: AC
  Filled 2018-06-05: qty 1

## 2018-06-05 MED ORDER — PROMETHAZINE HCL 25 MG/ML IJ SOLN
12.5000 mg | Freq: Four times a day (QID) | INTRAMUSCULAR | Status: AC | PRN
  Administered 2018-06-05: 12.5 mg via INTRAVENOUS

## 2018-06-05 MED ORDER — SODIUM CHLORIDE 0.9 % IV SOLN
Freq: Once | INTRAVENOUS | Status: AC
  Administered 2018-06-05: 12:00:00 via INTRAVENOUS
  Filled 2018-06-05: qty 250

## 2018-06-05 MED ORDER — PROMETHAZINE HCL 25 MG/ML IJ SOLN: 12.5000 mg | Freq: Once | INTRAMUSCULAR | Status: DC

## 2018-06-05 MED ORDER — FAMOTIDINE IN NACL 20-0.9 MG/50ML-% IV SOLN
INTRAVENOUS | Status: AC
  Filled 2018-06-05: qty 50

## 2018-06-05 MED ORDER — ATROPINE SULFATE 1 MG/ML IJ SOLN
0.4000 mg | Freq: Once | INTRAMUSCULAR | Status: AC
  Administered 2018-06-05: 0.4 mg via INTRAVENOUS

## 2018-06-05 MED ORDER — SODIUM CHLORIDE 0.9 % IV SOLN
45.0000 mg/m2 | Freq: Once | INTRAVENOUS | Status: AC
  Administered 2018-06-05: 84 mg via INTRAVENOUS
  Filled 2018-06-05: qty 14

## 2018-06-05 MED ORDER — DIPHENHYDRAMINE HCL 50 MG/ML IJ SOLN
50.0000 mg | Freq: Once | INTRAMUSCULAR | Status: AC
  Administered 2018-06-05: 50 mg via INTRAVENOUS

## 2018-06-05 NOTE — Progress Notes (Signed)
72 year old female diagnosed with She Is a patient of Dr. Julien Nordmann.  Past medical history includes iron deficiency anemia, hypertension, GERD, and diabetes.  Medications include Colace, ferrous sulfate, Glucotrol, multivitamin, Compazine, and Zocor.  Labs include glucose 167 and albumin 2.8.  Height: 65 inches. Weight: 161.5 pounds. Usual body weight: 170 pounds. BMI: 26.88.  Patient reports she really has not tried to lose weight but she is not unhappy with her weight loss. Endorses about an 8 pound weight loss. She reports her appetite is stable and she denies nutrition impact symptoms other than constipation. She is taking a laxative when needed.  Nutrition diagnosis:  Food and nutrition related knowledge deficit related to lung cancer and associated treatments as evidenced by no prior need for nutrition related information.  Intervention: I educated patient on the importance of increasing calories and protein to promote weight maintenance. Recommended higher protein foods and small frequent meals. Encourage patient to develop a bowel regimen. Recommended increased water throughout the day. Questions were answered.  Teach back method used.  Contact information given.  Monitoring, evaluation, goals: Patient will tolerate adequate calories and protein for weight maintenance.  Next visit: Monday, November 18 during infusion.  **Disclaimer: This note was dictated with voice recognition software. Similar sounding words can inadvertently be transcribed and this note may contain transcription errors which may not have been corrected upon publication of note.**

## 2018-06-05 NOTE — Progress Notes (Signed)
About 5 minutes into pt's Taxol infusion, pt began "feeling funny", moaning, coughing profusely, and lost consciousness for several minutes. Taxol infusion stopped and disconnected, Jenna [name] notified, and 1L-NS line spiked, connected, and opened wide. Reaction kit opened and 20 mg IV Pepcid administered about 14:20. Vital signs taken and BP was hypotensive (95/71). Pt finally began responsive and communicative about 14:22. Jenna [name] arrived to treatment area and began assessing patient. Appeared pt was having dysrhythmias so 12 lead EKG was ordered. EKG performed and indicated NSR with PACs.   Pt stated she needed to move her bowels. Received verbal OK from Jenna [name] to assist pt to bathroom. Ambulated with pt to bathroom, where she promptly had several loose stools. Updated Jenna [name], and received orders for 0.4 mg Atropine IV. Orders placed and administered at 14:49. Around 14:52 pt began feeling nauseous and began vomiting. Updated Jenna [name] again, and received orders for 12.5 mg Phenergan IV. Orders placed and administered at 14:56. Stayed with pt for another 5 minutes until she stated she felt well enough to return to section.   Pt ambulated back to  chair without incident and stated she felt "awhole lot better". Received update from Jenna [name]: per Jenna [name] re-challenge pt with Taxol and run like first time infusion. Updated pt on plan and was agreeable to re-challenge. Calculations for Taxol worksheet completed and checked off by Jenna [name] in [name].   Will continue to monitor and update Jenna/Jenna [name] as needed.

## 2018-06-05 NOTE — Progress Notes (Signed)
    DATE:  06/05/2018                                          X CHEMO/IMMUNOTHERAPY REACTION             MD:  Dr. Fanny Bien. Mohamed    AGENT/BLOOD PRODUCT RECEIVING TODAY:               Paclitaxel and carboplatin   AGENT/BLOOD PRODUCT RECEIVING IMMEDIATELY PRIOR TO REACTION:           Paclitaxel   VS: BP:      95/71    P:        41 BPM to 150 BPM     SPO2:        100% on room air                BP:      165/81   P:        75  BPM         SPO2:        97% on room air     REACTION(S):           Diarrhea, vomiting, stomach pain, cough, hypotension, tachycardia, and sedation   PREMEDS:       Aloxi, Benadryl 50 mg, dexamethasone 20 mg   INTERVENTION: Phenergan 12.5 mg IV, atropine 0.4 mg IV   Review of Systems  Review of Systems  Constitutional: Negative for chills, diaphoresis and fever.  HENT: Negative for trouble swallowing and voice change.   Respiratory: Positive for cough. Negative for chest tightness, shortness of breath and wheezing.   Cardiovascular: Negative for chest pain and palpitations.  Gastrointestinal: Positive for diarrhea, nausea and vomiting. Negative for abdominal pain and constipation.  Musculoskeletal: Negative for back pain and myalgias.  Neurological: Negative for dizziness, light-headedness and headaches.  Psychiatric/Behavioral:       Sedation     Physical Exam  Physical Exam  Constitutional: She appears lethargic. No distress.  HENT:  Head: Normocephalic and atraumatic.  Cardiovascular: Normal rate and normal heart sounds. An irregularly irregular rhythm present. Exam reveals no gallop and no friction rub.  No murmur heard. Pulmonary/Chest: Effort normal and breath sounds normal. No respiratory distress. She has no wheezes. She has no rales.  Neurological: She appears lethargic.  Skin: Skin is warm and dry. No rash noted. She is not diaphoretic. No erythema.    OUTCOME:                 An EKG was completed which showed a sinus rhythm at 82 bpm  with premature atrial complexes.  The EKG was reviewed with Dr. Earlie Server.  The patient was restarted on paclitaxel as if she were receiving it for the first time.  She continues on her infusion with no other issues of concern.   Sandi Mealy, MHS, PA-C  This case was discussed with Dr. Julien Nordmann. He expressed agreement with my management of this patient.

## 2018-06-05 NOTE — Patient Instructions (Signed)
[facility] Discharge Instructions for Patients Receiving Chemotherapy  Today you received the following chemotherapy agents: Paclitaxel (Taxol) and Carboplatin (Paraplatin)  To help prevent nausea and vomiting after your treatment, we encourage you to take your nausea medication as directed.    If you develop nausea and vomiting that is not controlled by your nausea medication, call the clinic.   BELOW ARE SYMPTOMS THAT SHOULD BE REPORTED IMMEDIATELY:  *FEVER GREATER THAN 100.5 F  *CHILLS WITH OR WITHOUT FEVER  NAUSEA AND VOMITING THAT IS NOT CONTROLLED WITH YOUR NAUSEA MEDICATION  *UNUSUAL SHORTNESS OF BREATH  *UNUSUAL BRUISING OR BLEEDING  TENDERNESS IN MOUTH AND THROAT WITH OR WITHOUT PRESENCE OF ULCERS  *URINARY PROBLEMS  *BOWEL PROBLEMS  UNUSUAL RASH Items with * indicate a potential emergency and should be followed up as soon as possible.  Feel free to call the clinic should you have any questions or concerns. The clinic phone number is [phone].  Please show the [place] at check-in to the Emergency Department and triage nurse.

## 2018-06-05 NOTE — Telephone Encounter (Signed)
Pt sched per 10/21 sch  Message.

## 2018-06-06 ENCOUNTER — Ambulatory Visit
Admission: RE | Admit: 2018-06-06 | Discharge: 2018-06-06 | Disposition: A | Discharge status: Home / Self Care | Payer: Medicare Other | Source: Ambulatory Visit | Attending: Radiation Oncology | Admitting: Radiation Oncology

## 2018-06-06 DIAGNOSIS — Z510 Encounter for antineoplastic radiation therapy: Secondary | ICD-10-CM | POA: Diagnosis not present

## 2018-06-06 DIAGNOSIS — C342 Malignant neoplasm of middle lobe, bronchus or lung: Secondary | ICD-10-CM | POA: Diagnosis not present

## 2018-06-07 ENCOUNTER — Ambulatory Visit
Admission: RE | Admit: 2018-06-07 | Discharge: 2018-06-07 | Disposition: A | Discharge status: Home / Self Care | Payer: Medicare Other | Source: Ambulatory Visit | Attending: Radiation Oncology | Admitting: Radiation Oncology

## 2018-06-07 DIAGNOSIS — C342 Malignant neoplasm of middle lobe, bronchus or lung: Secondary | ICD-10-CM | POA: Diagnosis not present

## 2018-06-07 DIAGNOSIS — Z510 Encounter for antineoplastic radiation therapy: Secondary | ICD-10-CM | POA: Diagnosis not present

## 2018-06-08 ENCOUNTER — Ambulatory Visit
Admission: RE | Admit: 2018-06-08 | Discharge: 2018-06-08 | Disposition: A | Discharge status: Home / Self Care | Payer: Medicare Other | Source: Ambulatory Visit | Attending: Radiation Oncology | Admitting: Radiation Oncology

## 2018-06-08 DIAGNOSIS — Z510 Encounter for antineoplastic radiation therapy: Secondary | ICD-10-CM | POA: Diagnosis not present

## 2018-06-08 DIAGNOSIS — C342 Malignant neoplasm of middle lobe, bronchus or lung: Secondary | ICD-10-CM | POA: Diagnosis not present

## 2018-06-09 ENCOUNTER — Other Ambulatory Visit: Payer: Self-pay | Admitting: Radiation Oncology

## 2018-06-09 ENCOUNTER — Ambulatory Visit
Admission: RE | Admit: 2018-06-09 | Discharge: 2018-06-09 | Disposition: A | Discharge status: Home / Self Care | Payer: Medicare Other | Source: Ambulatory Visit | Attending: Radiation Oncology | Admitting: Radiation Oncology

## 2018-06-09 DIAGNOSIS — Z510 Encounter for antineoplastic radiation therapy: Secondary | ICD-10-CM | POA: Diagnosis not present

## 2018-06-09 DIAGNOSIS — C342 Malignant neoplasm of middle lobe, bronchus or lung: Secondary | ICD-10-CM | POA: Diagnosis not present

## 2018-06-09 LAB — ACID FAST CULTURE WITH REFLEXED SENSITIVITIES (MYCOBACTERIA): Acid Fast Culture: NEGATIVE

## 2018-06-09 MED ORDER — SUCRALFATE 1 G PO TABS: 1.0000 g | ORAL_TABLET | Freq: Four times a day (QID) | ORAL | 2 refills | Status: DC

## 2018-06-12 ENCOUNTER — Inpatient Hospital Stay: Payer: Medicare Other

## 2018-06-12 ENCOUNTER — Inpatient Hospital Stay: Payer: Medicare Other | Admitting: Medical

## 2018-06-12 ENCOUNTER — Other Ambulatory Visit: Payer: Self-pay | Admitting: Medical Oncology

## 2018-06-12 ENCOUNTER — Inpatient Hospital Stay (HOSPITAL_BASED_OUTPATIENT_CLINIC_OR_DEPARTMENT_OTHER): Payer: Medicare Other | Admitting: Internal Medicine

## 2018-06-12 ENCOUNTER — Encounter: Payer: Self-pay | Admitting: Internal Medicine

## 2018-06-12 ENCOUNTER — Ambulatory Visit
Admission: RE | Admit: 2018-06-12 | Discharge: 2018-06-12 | Disposition: A | Discharge status: Home / Self Care | Payer: Medicare Other | Source: Ambulatory Visit | Attending: Radiation Oncology | Admitting: Radiation Oncology

## 2018-06-12 ENCOUNTER — Telehealth: Payer: Self-pay | Admitting: Internal Medicine

## 2018-06-12 VITALS — BP 139/66 | HR 89 | Temp 98.6°F | Resp 16

## 2018-06-12 VITALS — BP 150/55 | HR 95 | Temp 99.2°F | Resp 18 | Ht 65.0 in | Wt 155.8 lb

## 2018-06-12 DIAGNOSIS — R070 Pain in throat: Secondary | ICD-10-CM

## 2018-06-12 DIAGNOSIS — I878 Other specified disorders of veins: Secondary | ICD-10-CM

## 2018-06-12 DIAGNOSIS — Z510 Encounter for antineoplastic radiation therapy: Secondary | ICD-10-CM | POA: Diagnosis not present

## 2018-06-12 DIAGNOSIS — C342 Malignant neoplasm of middle lobe, bronchus or lung: Secondary | ICD-10-CM

## 2018-06-12 DIAGNOSIS — Z5111 Encounter for antineoplastic chemotherapy: Secondary | ICD-10-CM | POA: Diagnosis not present

## 2018-06-12 DIAGNOSIS — D6481 Anemia due to antineoplastic chemotherapy: Secondary | ICD-10-CM

## 2018-06-12 DIAGNOSIS — T8090XA Unspecified complication following infusion and therapeutic injection, initial encounter: Secondary | ICD-10-CM

## 2018-06-12 DIAGNOSIS — D509 Iron deficiency anemia, unspecified: Secondary | ICD-10-CM

## 2018-06-12 LAB — CBC WITH DIFFERENTIAL (CANCER CENTER ONLY)
Abs Immature Granulocytes: 0.01 10*3/uL (ref 0.00–0.07)
Basophils Absolute: 0 10*3/uL (ref 0.0–0.1)
Basophils Relative: 1 %
EOS ABS: 0 10*3/uL (ref 0.0–0.5)
EOS PCT: 1 %
HCT: 27.5 % — ABNORMAL LOW (ref 36.0–46.0)
Hemoglobin: 8.2 g/dL — ABNORMAL LOW (ref 12.0–15.0)
IMMATURE GRANULOCYTES: 1 %
Lymphocytes Relative: 6 %
Lymphs Abs: 0.1 10*3/uL — ABNORMAL LOW (ref 0.7–4.0)
MCH: 24.1 pg — AB (ref 26.0–34.0)
MCHC: 29.8 g/dL — AB (ref 30.0–36.0)
MCV: 80.9 fL (ref 80.0–100.0)
MONO ABS: 0.1 10*3/uL (ref 0.1–1.0)
MONOS PCT: 6 %
NEUTROS PCT: 85 %
Neutro Abs: 1.9 10*3/uL (ref 1.7–7.7)
Platelet Count: 213 10*3/uL (ref 150–400)
RBC: 3.4 MIL/uL — ABNORMAL LOW (ref 3.87–5.11)
RDW: 16.5 % — AB (ref 11.5–15.5)
WBC Count: 2.2 10*3/uL — ABNORMAL LOW (ref 4.0–10.5)
nRBC: 0 % (ref 0.0–0.2)

## 2018-06-12 LAB — CMP (CANCER CENTER ONLY)
ALK PHOS: 87 U/L (ref 38–126)
ALT: 7 U/L (ref 0–44)
ANION GAP: 8 (ref 5–15)
AST: 13 U/L — ABNORMAL LOW (ref 15–41)
Albumin: 2.8 g/dL — ABNORMAL LOW (ref 3.5–5.0)
BILIRUBIN TOTAL: 0.3 mg/dL (ref 0.3–1.2)
BUN: 12 mg/dL (ref 8–23)
CALCIUM: 8.9 mg/dL (ref 8.9–10.3)
CO2: 26 mmol/L (ref 22–32)
CREATININE: 0.97 mg/dL (ref 0.44–1.00)
Chloride: 104 mmol/L (ref 98–111)
GFR, Est AFR Am: >60 mL/min (ref >60)
GFR, Estimated: 57 mL/min — ABNORMAL LOW (ref >60)
GLUCOSE: 216 mg/dL — AB (ref 70–99)
Potassium: 4.1 mmol/L (ref 3.5–5.1)
Sodium: 138 mmol/L (ref 135–145)
TOTAL PROTEIN: 7.4 g/dL (ref 6.5–8.1)

## 2018-06-12 MED ORDER — SODIUM CHLORIDE 0.9 % IV SOLN
20.0000 mg | Freq: Once | INTRAVENOUS | Status: AC
  Administered 2018-06-12: 20 mg via INTRAVENOUS
  Filled 2018-06-12: qty 2

## 2018-06-12 MED ORDER — FAMOTIDINE IN NACL 20-0.9 MG/50ML-% IV SOLN
20.0000 mg | Freq: Once | INTRAVENOUS | Status: AC
  Administered 2018-06-12: 20 mg via INTRAVENOUS

## 2018-06-12 MED ORDER — PALONOSETRON HCL INJECTION 0.25 MG/5ML
INTRAVENOUS | Status: AC
  Filled 2018-06-12: qty 5

## 2018-06-12 MED ORDER — FAMOTIDINE IN NACL 20-0.9 MG/50ML-% IV SOLN
INTRAVENOUS | Status: AC
  Filled 2018-06-12: qty 50

## 2018-06-12 MED ORDER — DIPHENHYDRAMINE HCL 50 MG/ML IJ SOLN
50.0000 mg | Freq: Once | INTRAMUSCULAR | Status: AC
  Administered 2018-06-12: 50 mg via INTRAVENOUS

## 2018-06-12 MED ORDER — PALONOSETRON HCL INJECTION 0.25 MG/5ML
0.2500 mg | Freq: Once | INTRAVENOUS | Status: AC
  Administered 2018-06-12: 0.25 mg via INTRAVENOUS

## 2018-06-12 MED ORDER — SODIUM CHLORIDE 0.9 % IV SOLN
45.0000 mg/m2 | Freq: Once | INTRAVENOUS | Status: AC
  Administered 2018-06-12: 84 mg via INTRAVENOUS
  Filled 2018-06-12: qty 14

## 2018-06-12 MED ORDER — SODIUM CHLORIDE 0.9 % IV SOLN
180.2000 mg | Freq: Once | INTRAVENOUS | Status: AC
  Administered 2018-06-12: 180 mg via INTRAVENOUS
  Filled 2018-06-12: qty 18

## 2018-06-12 MED ORDER — SODIUM CHLORIDE 0.9 % IV SOLN
Freq: Once | INTRAVENOUS | Status: AC
  Administered 2018-06-12: 11:00:00 via INTRAVENOUS
  Filled 2018-06-12: qty 250

## 2018-06-12 MED ORDER — DIPHENHYDRAMINE HCL 50 MG/ML IJ SOLN
50.0000 mg | Freq: Once | INTRAMUSCULAR | Status: AC | PRN
  Administered 2018-06-12: 25 mg via INTRAVENOUS

## 2018-06-12 MED ORDER — DIPHENHYDRAMINE HCL 50 MG/ML IJ SOLN
INTRAMUSCULAR | Status: AC
  Filled 2018-06-12: qty 1

## 2018-06-12 NOTE — Progress Notes (Signed)
@  1239 pt was unresponsive 6 minutes intoTaxol treatment (which was infused as a first time again) . Taxol infusion stopped. This RN performed a sternal rub with no response from pt, chest compressions started and after 3 chest compressions pt came to and said "oww". NS hung to gravity, Benadryl 25mg  given. Hypersensitivity protocol started, Sandi Mealy called to tx area. Pt VS as charted in flowsheets. Pt VSS. Will no longer be getting Taxol, per Dr Julien Nordmann. Pt to have Carboplatin infusion today, to be started at 1330.

## 2018-06-12 NOTE — Progress Notes (Signed)
MD made aware of pts recent weight loss & would like to cont same Carbo dose w/o change. Kennith Center, Pharm.D., CPP 06/12/2018@11 :14 AM

## 2018-06-12 NOTE — Telephone Encounter (Signed)
Appts already scheduled per 10/28 los - no additional appts added.

## 2018-06-12 NOTE — Patient Instructions (Addendum)
[facility] Discharge Instructions for Patients Receiving Chemotherapy  Today you received the following chemotherapy agents:  Carboplatin (Paraplatin)  To help prevent nausea and vomiting after your treatment, we encourage you to take your nausea medication as directed.    If you develop nausea and vomiting that is not controlled by your nausea medication, call the clinic.   BELOW ARE SYMPTOMS THAT SHOULD BE REPORTED IMMEDIATELY:  *FEVER GREATER THAN 100.5 F  *CHILLS WITH OR WITHOUT FEVER  NAUSEA AND VOMITING THAT IS NOT CONTROLLED WITH YOUR NAUSEA MEDICATION  *UNUSUAL SHORTNESS OF BREATH  *UNUSUAL BRUISING OR BLEEDING  TENDERNESS IN MOUTH AND THROAT WITH OR WITHOUT PRESENCE OF ULCERS  *URINARY PROBLEMS  *BOWEL PROBLEMS  UNUSUAL RASH Items with * indicate a potential emergency and should be followed up as soon as possible.  Feel free to call the clinic should you have any questions or concerns. The clinic phone number is [phone].  Please show the [facility] at check-in to the Emergency Department and triage nurse.   Implanted [facility] Guide An implanted port is a type of central line that is placed under the skin. Central lines are used to provide IV access when treatment or nutrition needs to be given through a person's veins. Implanted ports are used for long-term IV access. An implanted port may be placed because:  You need IV medicine that would be irritating to the small veins in your hands or arms.  You need long-term IV medicines, such as antibiotics.  You need IV nutrition for a long period.  You need frequent blood draws for lab tests.  You need dialysis.  Implanted ports are usually placed in the chest area, but they can also be placed in the upper arm, the abdomen, or the leg. An implanted port has two main parts:  Reservoir. The reservoir is round and will appear as a small, raised area under your skin. The reservoir is the  part where a needle is inserted to give medicines or draw blood.  Catheter. The catheter is a thin, flexible tube that extends from the reservoir. The catheter is placed into a large vein. Medicine that is inserted into the reservoir goes into the catheter and then into the vein.  How will I care for my incision site? Do not get the incision site wet. Bathe or shower as directed by your health care provider. How is my port accessed? Special steps must be taken to access the port:  Before the port is accessed, a numbing cream can be placed on the skin. This helps numb the skin over the port site.  Your health care provider uses a sterile technique to access the port. ? Your health care provider must put on a mask and sterile gloves. ? The skin over your port is cleaned carefully with an antiseptic and allowed to dry. ? The port is gently pinched between sterile gloves, and a needle is inserted into the port.  Only "non-coring" port needles should be used to access the port. Once the port is accessed, a blood return should be checked. This helps ensure that the port is in the vein and is not clogged.  If your port needs to remain accessed for a constant infusion, a clear (transparent) bandage will be placed over the needle site. The bandage and needle will need to be changed every week, or as directed by your health care provider.  Keep the bandage covering the needle clean and dry.  Do not get it wet. Follow your health care provider's instructions on how to take a shower or bath while the port is accessed.  If your port does not need to stay accessed, no bandage is needed over the port.  What is flushing? Flushing helps keep the port from getting clogged. Follow your health care provider's instructions on how and when to flush the port. Ports are usually flushed with saline solution or a medicine called heparin. The need for flushing will depend on how the port is used.  If the port is used  for intermittent medicines or blood draws, the port will need to be flushed: ? After medicines have been given. ? After blood has been drawn. ? As part of routine maintenance.  If a constant infusion is running, the port may not need to be flushed.  How long will my port stay implanted? The port can stay in for as long as your health care provider thinks it is needed. When it is time for the port to come out, surgery will be done to remove it. The procedure is similar to the one performed when the port was put in. When should I seek immediate medical care? When you have an implanted port, you should seek immediate medical care if:  You notice a bad smell coming from the incision site.  You have swelling, redness, or drainage at the incision site.  You have more swelling or pain at the port site or the surrounding area.  You have a fever that is not controlled with medicine.  This information is not intended to replace advice given to you by your health care provider. Make sure you discuss any questions you have with your health care provider. Document Released: 08/02/2005 Document Revised: 01/08/2016 Document Reviewed: 04/09/2013 Elsevier Interactive Patient Education  2017 [employer].

## 2018-06-12 NOTE — Progress Notes (Signed)
[facility] [phone]   [phone]  OFFICE PROGRESS NOTE  [name], MD [place] [address]  DIAGNOSIS: Stage IIIB (T3,N3, M0)non-small cell lung cancer, squamous cell carcinoma presented with large right middle lobe lung breast in addition to mediastinal and bilateral hilar lymphadenopathy and suspicious right supraclavicular lymph node diagnosed in August 2019.   PRIOR THERAPY: None  CURRENT THERAPY: A course of concurrent chemoradiation with weekly carboplatin for AUC of 2 and paclitaxel 45 MG/M2.  First dose of chemotherapy given on 05/29/2018.  Status post 2 cycles.  INTERVAL HISTORY: Jenna [name] 72 y.o. female returns to the clinic today for follow-up visit.  The patient is feeling fine today with no specific complaints.  She had sore throat and she was started on Carafate by Dr. Lisbeth Renshaw.  She had some infusion reaction to Taxol during the last visit but the patient was a started again on the treatment at slower rate and she did much better.  She denied having any current chest pain, shortness breath, cough or hemoptysis.  She denied having any weight loss or night sweats.  She has no nausea, vomiting, diarrhea or constipation.  She is here today for evaluation before starting cycle #3.  MEDICAL HISTORY: Past Medical History:  Diagnosis Date  . DM (diabetes mellitus) ([place])   . GERD (gastroesophageal reflux disease)   . Hypertension   . Iron deficiency anemia     ALLERGIES:  is allergic to aspirin; esomeprazole magnesium; and ace inhibitors.  MEDICATIONS:  Current Outpatient Medications  Medication Sig Dispense Refill  . dexlansoprazole (DEXILANT) 60 MG capsule 1 BY MOUTH 30 MIN BEFORE BREAKFAST DAILY (Patient taking differently: Take 60 mg by mouth See admin instructions. 1 BY MOUTH 30 MIN BEFORE BREAKFAST DAILY) 30 capsule 11  . diphenhydrAMINE (BENADRYL) 2 % cream Apply 1 application topically  as needed for itching.    . docusate sodium (COLACE) 100 MG capsule Take 100 mg by mouth daily as needed for mild constipation.    . ferrous sulfate 325 (65 FE) MG tablet Take 325 mg by mouth 2 (two) times daily with a meal.     . glipiZIDE (GLUCOTROL) 10 MG tablet Take 1 tablet (10 mg total) by mouth 2 (two) times daily before a meal. 60 tablet 3  . labetalol (NORMODYNE) 100 MG tablet Take 1 tablet (100 mg total) by mouth 2 (two) times daily. 180 tablet 0  . Multiple Vitamin (MULTIVITAMIN WITH MINERALS) TABS tablet Take 1 tablet by mouth at bedtime.     . prochlorperazine (COMPAZINE) 10 MG tablet Take 1 tablet (10 mg total) by mouth every 6 (six) hours as needed for nausea or vomiting. 30 tablet 0  . simvastatin (ZOCOR) 20 MG tablet Take 20 mg by mouth every evening.    . sucralfate (CARAFATE) 1 g tablet Take 1 tablet (1 g total) by mouth 4 (four) times daily. 120 tablet 2   No current facility-administered medications for this visit.    Facility-Administered Medications Ordered in Other Visits  Medication Dose Route Frequency Provider Last Rate Last Dose  . promethazine (PHENERGAN) injection 12.5 mg  12.5 mg Intravenous Once Harle Stanford., PA-C        SURGICAL HISTORY:  Past Surgical History:  Procedure Laterality Date  . CATARACT EXTRACTION Right   . CHOLECYSTECTOMY    . COLONOSCOPY  10/24/2009   normal rectum/1X1cm abnormal lesion in the ascending colon (bx benign). TI  normal for 10cm.  Prep difficult/inadequate. f/u TCS 09/2012 recommended  . COLONOSCOPY  10/13/2004   Normal rectum/Diminutive polyps, splenic flexure, cold biopsied/removed.  Remainder of colonic mucosa appeared normal.  . COLONOSCOPY N/A 12/14/2012   RCV:ELFYBOF polyp-tubular adenoma  . ESOPHAGOGASTRODUODENOSCOPY  10/13/2004    Normal esophagus/ Nodular volcano like lesion in the antrum, either representing a  pancreatic rest or leiomyoma, biopsied.  Remainder of the gastric mucosa appeared normal, normal D1-D2  .  ESOPHAGOGASTRODUODENOSCOPY  10/24/2009   Benign biopsies. normal esophagus/small hiatal hernia/nodular lesion antrum/distal greater curvature. duodenal AVM s/p ablation  . GIVENS CAPSULE STUDY  07/27/2010    multiple arteriovenous malformations which could definitely be the contributor to her drifting hemoglobin and hematocrit  . TUBAL LIGATION    . VIDEO BRONCHOSCOPY WITH ENDOBRONCHIAL ULTRASOUND N/A 04/27/2018   Procedure: VIDEO BRONCHOSCOPY WITH ENDOBRONCHIAL ULTRASOUND;  Surgeon: Melrose Nakayama, MD;  Location: [facility] OR;  Service: Thoracic;  Laterality: N/A;    REVIEW OF SYSTEMS:  A comprehensive review of systems was negative except for: Constitutional: positive for fatigue   PHYSICAL EXAMINATION: General appearance: alert, cooperative, fatigued and no distress Head: Normocephalic, without obvious abnormality, atraumatic Neck: no adenopathy, no JVD, supple, symmetrical, trachea midline and thyroid not enlarged, symmetric, no tenderness/mass/nodules Lymph nodes: Cervical, supraclavicular, and axillary nodes normal. Resp: clear to auscultation bilaterally Back: symmetric, no curvature. ROM normal. No CVA tenderness. Cardio: regular rate and rhythm, S1, S2 normal, no murmur, click, rub or gallop GI: soft, non-tender; bowel sounds normal; no masses,  no organomegaly Extremities: extremities normal, atraumatic, no cyanosis or edema  ECOG PERFORMANCE STATUS: 1 - Symptomatic but completely ambulatory  Blood pressure (!) 150/55, pulse 95, temperature 99.2 F (37.3 C), temperature source Oral, resp. rate 18, height 5\' 5"  (1.651 m), weight 155 lb 12.8 oz (70.7 kg), SpO2 99 %.  LABORATORY DATA: Lab Results  Component Value Date   WBC 2.2 (L) 06/12/2018   HGB 8.2 (L) 06/12/2018   HCT 27.5 (L) 06/12/2018   MCV 80.9 06/12/2018   PLT 213 06/12/2018      Chemistry      Component Value Date/Time   NA 135 06/05/2018 1115   K 3.8 06/05/2018 1115   CL 101 06/05/2018 1115   CO2 25  06/05/2018 1115   BUN 11 06/05/2018 1115   BUN 12 12/10/2011 0919   CREATININE 0.88 06/05/2018 1115   CREATININE 0.72 12/10/2011 0919      Component Value Date/Time   CALCIUM 8.9 06/05/2018 1115   ALKPHOS 101 06/05/2018 1115   ALKPHOS 88 12/10/2011 0919   AST 12 (L) 06/05/2018 1115   ALT 9 06/05/2018 1115   BILITOT 0.3 06/05/2018 1115       RADIOGRAPHIC STUDIES: [place] Core Biopsy (lymph Nodes)  Result Date: 05/18/2018 INDICATION: Hypermetabolic right thoracic inlet lymph node. EXAM: ULTRASOUND GUIDED CORE BIOPSY OF RIGHT THORACIC INLET LYMPH NODE MEDICATIONS: None. ANESTHESIA/SEDATION: Fentanyl 50 mcg IV; Versed 1.5 mg IV Moderate Sedation Time:  10 The patient was continuously monitored during the procedure by the interventional radiology nurse under my direct supervision. PROCEDURE: The procedure, risks, benefits, and alternatives were explained to the patient. Questions regarding the procedure were encouraged and answered. The patient understands and consents to the procedure. The right neck was prepped with ChloraPrep in a sterile fashion, and a sterile drape was applied covering the operative field. A sterile gown and sterile gloves were used for the procedure. Local anesthesia was provided with 1% Lidocaine. Under sonographic guidance, 2 18  gauge core biopsies of the abnormal right thoracic inlet lymph node were obtained. COMPLICATIONS: None immediate. FINDINGS: Imaging documents needle placement in the right thoracic inlet lymph node. IMPRESSION: Successful core biopsy of a right thoracic inlet lymph node. Electronically Signed   By: Marybelle Killings M.D.   On: 05/18/2018 09:41    ASSESSMENT AND PLAN: This is a very pleasant 72 years old African-American female recently diagnosed with a stage IIIb non-small cell lung cancer, squamous cell carcinoma.  She is currently undergoing a course of concurrent chemoradiation with weekly carboplatin and paclitaxel status post 2 cycles.  She has been  tolerating this treatment well with no concerning adverse effect except for sore throat. I recommended for the patient to proceed with cycle #3 today as a schedule. For the chemotherapy-induced anemia, I will arrange for the patient to receive 2 units of PRBCs transfusion. The patient will come back for follow-up visit in 2 weeks for evaluation before starting cycle #5. She was advised to call immediately if she has any concerning symptoms in the interval. The patient voices understanding of current disease status and treatment options and is in agreement with the current care plan.  All questions were answered. The patient knows to call the clinic with any problems, questions or concerns. We can certainly see the patient much sooner if necessary.  I spent 10 minutes counseling the patient face to face. The total time spent in the appointment was 15 minutes.  Disclaimer: This note was dictated with voice recognition software. Similar sounding words can inadvertently be transcribed and may not be corrected upon review.

## 2018-06-13 ENCOUNTER — Ambulatory Visit
Admission: RE | Admit: 2018-06-13 | Discharge: 2018-06-13 | Disposition: A | Discharge status: Home / Self Care | Payer: Medicare Other | Source: Ambulatory Visit | Attending: Radiation Oncology | Admitting: Radiation Oncology

## 2018-06-13 ENCOUNTER — Telehealth: Payer: Self-pay | Admitting: Medical

## 2018-06-13 DIAGNOSIS — C342 Malignant neoplasm of middle lobe, bronchus or lung: Secondary | ICD-10-CM | POA: Diagnosis not present

## 2018-06-13 DIAGNOSIS — Z510 Encounter for antineoplastic radiation therapy: Secondary | ICD-10-CM | POA: Diagnosis not present

## 2018-06-13 NOTE — Telephone Encounter (Signed)
No 10/28 los nor referrals.

## 2018-06-14 ENCOUNTER — Ambulatory Visit
Admission: RE | Admit: 2018-06-14 | Discharge: 2018-06-14 | Disposition: A | Discharge status: Home / Self Care | Payer: Medicare Other | Source: Ambulatory Visit | Attending: Radiation Oncology | Admitting: Radiation Oncology

## 2018-06-14 DIAGNOSIS — C342 Malignant neoplasm of middle lobe, bronchus or lung: Secondary | ICD-10-CM | POA: Diagnosis not present

## 2018-06-14 DIAGNOSIS — Z510 Encounter for antineoplastic radiation therapy: Secondary | ICD-10-CM | POA: Diagnosis not present

## 2018-06-15 ENCOUNTER — Ambulatory Visit
Admission: RE | Admit: 2018-06-15 | Discharge: 2018-06-15 | Disposition: A | Discharge status: Home / Self Care | Payer: Medicare Other | Source: Ambulatory Visit | Attending: Radiation Oncology | Admitting: Radiation Oncology

## 2018-06-15 DIAGNOSIS — Z510 Encounter for antineoplastic radiation therapy: Secondary | ICD-10-CM | POA: Diagnosis not present

## 2018-06-15 DIAGNOSIS — C342 Malignant neoplasm of middle lobe, bronchus or lung: Secondary | ICD-10-CM | POA: Diagnosis not present

## 2018-06-16 ENCOUNTER — Ambulatory Visit
Admission: RE | Admit: 2018-06-16 | Discharge: 2018-06-16 | Disposition: A | Discharge status: Home / Self Care | Payer: Medicare Other | Source: Ambulatory Visit | Attending: Radiation Oncology | Admitting: Radiation Oncology

## 2018-06-16 ENCOUNTER — Other Ambulatory Visit: Payer: Self-pay | Admitting: Internal Medicine

## 2018-06-16 DIAGNOSIS — C342 Malignant neoplasm of middle lobe, bronchus or lung: Secondary | ICD-10-CM | POA: Insufficient documentation

## 2018-06-16 DIAGNOSIS — Z510 Encounter for antineoplastic radiation therapy: Secondary | ICD-10-CM | POA: Insufficient documentation

## 2018-06-18 NOTE — Progress Notes (Signed)
    DATE:  07/13/2018                                          X CHEMO/IMMUNOTHERAPY REACTION            MD:     Dr. Fanny Bien. Mohamed   AGENT/BLOOD PRODUCT RECEIVING TODAY:               Paclitaxel   AGENT/BLOOD PRODUCT RECEIVING IMMEDIATELY PRIOR TO REACTION:           Paclitaxel   VS: BP:      140/49   P:        100       SPO2:        100% on room air                BP:      161/75   P:        89         SPO2:        100% on room air     REACTION(S):            Cough and unresponsiveness   PREMEDS:      Aloxi, Benadryl, dexamethasone, and Pepcid   INTERVENTION: Benadryl 25 mg IV and supplemental oxygen   Review of Systems  Review of Systems  Constitutional: Negative for chills, diaphoresis and fever.  HENT: Negative for trouble swallowing and voice change.   Respiratory: Positive for cough. Negative for chest tightness, shortness of breath and wheezing.   Cardiovascular: Negative for chest pain and palpitations.  Gastrointestinal: Negative for abdominal pain, constipation, diarrhea, nausea and vomiting.  Musculoskeletal: Negative for back pain and myalgias.  Neurological:       The patient was unresponsive initially     Physical Exam  Physical Exam  Constitutional: She appears distressed.  HENT:  Head: Normocephalic and atraumatic.  Cardiovascular: Normal rate, regular rhythm and normal heart sounds. Exam reveals no gallop and no friction rub.  No murmur heard. Pulmonary/Chest: Effort normal and breath sounds normal. No respiratory distress. She has no wheezes. She has no rales.  Neurological:  The patient was initially unresponsive but returned to her baseline level of alertness after Taxol was stopped and Benadryl 25 mg IV was given and supplemental oxygen was administered.  Skin: Skin is warm. No rash noted. No erythema.    OUTCOME:             The decision was made to not rechallenge the patient with paclitaxel.       Sandi Mealy, MHS, PA-C  This case was  discussed with Dr. Julien Nordmann. He expressed agreement with my management of this patient.

## 2018-06-19 ENCOUNTER — Inpatient Hospital Stay: Payer: Medicare Other | Attending: Internal Medicine

## 2018-06-19 ENCOUNTER — Ambulatory Visit
Admission: RE | Admit: 2018-06-19 | Discharge: 2018-06-19 | Disposition: A | Discharge status: Home / Self Care | Payer: Medicare Other | Source: Ambulatory Visit | Attending: Radiation Oncology | Admitting: Radiation Oncology

## 2018-06-19 ENCOUNTER — Inpatient Hospital Stay: Payer: Medicare Other

## 2018-06-19 VITALS — BP 151/74 | HR 85 | Temp 98.4°F | Resp 17 | Ht 65.0 in | Wt 155.2 lb

## 2018-06-19 DIAGNOSIS — D709 Neutropenia, unspecified: Secondary | ICD-10-CM | POA: Insufficient documentation

## 2018-06-19 DIAGNOSIS — Z5111 Encounter for antineoplastic chemotherapy: Secondary | ICD-10-CM | POA: Diagnosis not present

## 2018-06-19 DIAGNOSIS — C342 Malignant neoplasm of middle lobe, bronchus or lung: Secondary | ICD-10-CM

## 2018-06-19 DIAGNOSIS — Z5189 Encounter for other specified aftercare: Secondary | ICD-10-CM | POA: Diagnosis not present

## 2018-06-19 DIAGNOSIS — Z79899 Other long term (current) drug therapy: Secondary | ICD-10-CM | POA: Diagnosis not present

## 2018-06-19 DIAGNOSIS — D649 Anemia, unspecified: Secondary | ICD-10-CM | POA: Diagnosis not present

## 2018-06-19 DIAGNOSIS — Z510 Encounter for antineoplastic radiation therapy: Secondary | ICD-10-CM | POA: Diagnosis not present

## 2018-06-19 LAB — CBC WITH DIFFERENTIAL (CANCER CENTER ONLY)
Abs Immature Granulocytes: 0.01 10*3/uL (ref 0.00–0.07)
Basophils Absolute: 0 10*3/uL (ref 0.0–0.1)
Basophils Relative: 1 %
EOS PCT: 1 %
Eosinophils Absolute: 0 10*3/uL (ref 0.0–0.5)
HEMATOCRIT: 27.8 % — AB (ref 36.0–46.0)
HEMOGLOBIN: 8.2 g/dL — AB (ref 12.0–15.0)
Immature Granulocytes: 1 %
LYMPHS ABS: 0.2 10*3/uL — AB (ref 0.7–4.0)
LYMPHS PCT: 8 %
MCH: 24.1 pg — AB (ref 26.0–34.0)
MCHC: 29.5 g/dL — ABNORMAL LOW (ref 30.0–36.0)
MCV: 81.8 fL (ref 80.0–100.0)
MONO ABS: 0.3 10*3/uL (ref 0.1–1.0)
Monocytes Relative: 14 %
Neutro Abs: 1.4 10*3/uL — ABNORMAL LOW (ref 1.7–7.7)
Neutrophils Relative %: 75 %
Platelet Count: 160 10*3/uL (ref 150–400)
RBC: 3.4 MIL/uL — ABNORMAL LOW (ref 3.87–5.11)
RDW: 18.3 % — ABNORMAL HIGH (ref 11.5–15.5)
WBC: 1.8 10*3/uL — AB (ref 4.0–10.5)
nRBC: 0 % (ref 0.0–0.2)

## 2018-06-19 LAB — CMP (CANCER CENTER ONLY)
ALK PHOS: 80 U/L (ref 38–126)
ALT: 15 U/L (ref 0–44)
AST: 15 U/L (ref 15–41)
Albumin: 2.9 g/dL — ABNORMAL LOW (ref 3.5–5.0)
Anion gap: 10 (ref 5–15)
BILIRUBIN TOTAL: 0.3 mg/dL (ref 0.3–1.2)
BUN: 12 mg/dL (ref 8–23)
CALCIUM: 8.7 mg/dL — AB (ref 8.9–10.3)
CO2: 27 mmol/L (ref 22–32)
CREATININE: 0.97 mg/dL (ref 0.44–1.00)
Chloride: 105 mmol/L (ref 98–111)
GFR, EST NON AFRICAN AMERICAN: 57 mL/min — AB (ref >60)
GFR, Est AFR Am: >60 mL/min (ref >60)
Glucose, Bld: 239 mg/dL — ABNORMAL HIGH (ref 70–99)
Potassium: 3.5 mmol/L (ref 3.5–5.1)
Sodium: 142 mmol/L (ref 135–145)
Total Protein: 7.2 g/dL (ref 6.5–8.1)

## 2018-06-19 MED ORDER — PALONOSETRON HCL INJECTION 0.25 MG/5ML
INTRAVENOUS | Status: AC
  Filled 2018-06-19: qty 5

## 2018-06-19 MED ORDER — SODIUM CHLORIDE 0.9 % IV SOLN
Freq: Once | INTRAVENOUS | Status: AC
  Administered 2018-06-19: 13:00:00 via INTRAVENOUS
  Filled 2018-06-19: qty 250

## 2018-06-19 MED ORDER — PALONOSETRON HCL INJECTION 0.25 MG/5ML
0.2500 mg | Freq: Once | INTRAVENOUS | Status: AC
  Administered 2018-06-19: 0.25 mg via INTRAVENOUS

## 2018-06-19 MED ORDER — DIPHENHYDRAMINE HCL 50 MG/ML IJ SOLN
INTRAMUSCULAR | Status: AC
  Filled 2018-06-19: qty 1

## 2018-06-19 MED ORDER — DIPHENHYDRAMINE HCL 50 MG/ML IJ SOLN
50.0000 mg | Freq: Once | INTRAMUSCULAR | Status: AC
  Administered 2018-06-19: 50 mg via INTRAVENOUS

## 2018-06-19 MED ORDER — FAMOTIDINE IN NACL 20-0.9 MG/50ML-% IV SOLN: 20.0000 mg | Freq: Once | INTRAVENOUS | Status: DC

## 2018-06-19 MED ORDER — SODIUM CHLORIDE 0.9 % IV SOLN
20.0000 mg | Freq: Once | INTRAVENOUS | Status: AC
  Administered 2018-06-19: 20 mg via INTRAVENOUS
  Filled 2018-06-19: qty 2

## 2018-06-19 MED ORDER — SODIUM CHLORIDE 0.9 % IV SOLN
180.2000 mg | Freq: Once | INTRAVENOUS | Status: AC
  Administered 2018-06-19: 180 mg via INTRAVENOUS
  Filled 2018-06-19: qty 18

## 2018-06-19 NOTE — Patient Instructions (Signed)
[facility] Discharge Instructions for Patients Receiving Chemotherapy  Today you received the following chemotherapy agents: Carboplatin (Paraplatin)  To help prevent nausea and vomiting after your treatment, we encourage you to take your nausea medication as directed.    If you develop nausea and vomiting that is not controlled by your nausea medication, call the clinic.   BELOW ARE SYMPTOMS THAT SHOULD BE REPORTED IMMEDIATELY:  *FEVER GREATER THAN 100.5 F  *CHILLS WITH OR WITHOUT FEVER  NAUSEA AND VOMITING THAT IS NOT CONTROLLED WITH YOUR NAUSEA MEDICATION  *UNUSUAL SHORTNESS OF BREATH  *UNUSUAL BRUISING OR BLEEDING  TENDERNESS IN MOUTH AND THROAT WITH OR WITHOUT PRESENCE OF ULCERS  *URINARY PROBLEMS  *BOWEL PROBLEMS  UNUSUAL RASH Items with * indicate a potential emergency and should be followed up as soon as possible.  Feel free to call the clinic should you have any questions or concerns. The clinic phone number is [phone].  Please show the [facility] at check-in to the Emergency Department and triage nurse.    Leukopenia Leukopenia is a condition in which you have a low number of white blood cells. White blood cells help the body to fight infections. The number of white blood cells in the body varies from person to person. There are five types of white blood cells. Two types (lymphocytes and neutrophils) make up most of the white blood cell count. When lymphocytes are low, the condition is called lymphocytopenia. When neutrophils are low, it is called neutropenia. Neutropenia is the most dangerous type of leukopenia because it can lead to dangerous infections. What are the causes? This condition is commonly caused by damage to soft tissue inside of the bones (bone marrow), which is where most white blood cells are made. Bone marrow can get damaged by:  Medicine or X-ray treatments for cancer (chemotherapy or radiation therapy).  Serious  infections.  Cancer of the white blood cells (leukemia, lymphoma, or myeloma).  Medicines, including: ? Certain antibiotics. ? Certain heart medicines. ? Steroids. ? Certain medicines used to treat diseases of the immune system (autoimmune diseases), like rheumatoid arthritis.  Leukopenia also happens when white blood cells are destroyed after leaving the bone marrow, which may result from:  Liver disease.  Autoimmune disease.  Vitamin B deficiencies.  What are the signs or symptoms? One of the most common signs of leukopenia, especially severe neutropenia, is having a lot of bacterial infections. Different infections have different symptoms. An infection in your lungs may cause coughing. A urinary tract infection may cause frequent urination and a burning sensation. You may also get infections of the blood, skin, rectum, throat, sinuses, or ears. Some people have no symptoms. If you do have symptoms, they may include:  Fever.  Fatigue.  Swollen glands (lymph nodes).  Painful mouth ulcers.  Gum disease.  How is this diagnosed? This condition may be diagnosed based on:  Your medical history.  A physical exam to check for swollen lymph nodes and an enlarged spleen. Your spleen is an organ on the left side of your body that stores white blood cells.  Tests, such as: ? A complete blood count. This blood test counts each type of white cell. ? Bone marrow aspiration. Some bone marrow is removed to be checked under a microscope. ? Lymph node biopsy. Some lymph node tissue is removed to be checked under a microscope. ? Other types of blood tests or imaging tests.  How is this treated? Treatment of leukopenia depends on  the cause. Some common treatments include:  Antibiotic medicine to treat bacterial infections.  Stopping medicines that may cause leukopenia.  Medicines to stimulate neutrophil production (hematopoietic growth factors), to treat neutropenia.  Follow these  instructions at home:  Take over-the-counter and prescription medicines only as told by your health care provider. This includes supplements and vitamins.  If you were prescribed an antibiotic medicine, take it as told by your health care provider. Do not stop taking the antibiotic even if you start to feel better.  Preventing infection is important if you have leukopenia. To prevent infection: ? Avoid close contact with sick people. ? Wash your hands frequently with soap and water. If soap and water are not available, use hand sanitizer. ? Do not&nbsp;eat uncooked or undercooked meats. ? Wash fruits and vegetables before eating them. ? Do not eat or drink unpasteurized dairy products. ? Get regular dental care, and maintain good dental hygiene. You should visit the dentist at least once every 6 months.  Keep all follow-up visits as told by your health care provider. This is important. Contact a health care provider if:  You have chills or a fever.  You have symptoms of an infection. Get help right away if:  You have a fever that lasts for more than 2-3 days.  You have symptoms that last for more than 2-3 days.  You have trouble breathing.  You have chest pain. This information is not intended to replace advice given to you by your health care provider. Make sure you discuss any questions you have with your health care provider. Document Released: 08/07/2013 Document Revised: 06/22/2016 Document Reviewed: 06/22/2016 Elsevier Interactive Patient Education  [employer].

## 2018-06-19 NOTE — Progress Notes (Signed)
Per Dr. Julien Nordmann: OK to treat with ANC of 1.4

## 2018-06-20 ENCOUNTER — Ambulatory Visit
Admission: RE | Admit: 2018-06-20 | Discharge: 2018-06-20 | Disposition: A | Discharge status: Home / Self Care | Payer: Medicare Other | Source: Ambulatory Visit | Attending: Radiation Oncology | Admitting: Radiation Oncology

## 2018-06-20 DIAGNOSIS — C342 Malignant neoplasm of middle lobe, bronchus or lung: Secondary | ICD-10-CM | POA: Diagnosis not present

## 2018-06-20 DIAGNOSIS — Z510 Encounter for antineoplastic radiation therapy: Secondary | ICD-10-CM | POA: Diagnosis not present

## 2018-06-21 ENCOUNTER — Ambulatory Visit
Admission: RE | Admit: 2018-06-21 | Discharge: 2018-06-21 | Disposition: A | Discharge status: Home / Self Care | Payer: Medicare Other | Source: Ambulatory Visit | Attending: Radiation Oncology | Admitting: Radiation Oncology

## 2018-06-21 DIAGNOSIS — C342 Malignant neoplasm of middle lobe, bronchus or lung: Secondary | ICD-10-CM | POA: Diagnosis not present

## 2018-06-21 DIAGNOSIS — Z510 Encounter for antineoplastic radiation therapy: Secondary | ICD-10-CM | POA: Diagnosis not present

## 2018-06-22 ENCOUNTER — Ambulatory Visit
Admission: RE | Admit: 2018-06-22 | Discharge: 2018-06-22 | Disposition: A | Discharge status: Home / Self Care | Payer: Medicare Other | Source: Ambulatory Visit | Attending: Radiation Oncology | Admitting: Radiation Oncology

## 2018-06-22 DIAGNOSIS — C342 Malignant neoplasm of middle lobe, bronchus or lung: Secondary | ICD-10-CM | POA: Diagnosis not present

## 2018-06-22 DIAGNOSIS — Z510 Encounter for antineoplastic radiation therapy: Secondary | ICD-10-CM | POA: Diagnosis not present

## 2018-06-23 ENCOUNTER — Ambulatory Visit
Admission: RE | Admit: 2018-06-23 | Discharge: 2018-06-23 | Disposition: A | Discharge status: Home / Self Care | Payer: Medicare Other | Source: Ambulatory Visit | Attending: Radiation Oncology | Admitting: Radiation Oncology

## 2018-06-23 DIAGNOSIS — C342 Malignant neoplasm of middle lobe, bronchus or lung: Secondary | ICD-10-CM | POA: Diagnosis not present

## 2018-06-23 DIAGNOSIS — Z510 Encounter for antineoplastic radiation therapy: Secondary | ICD-10-CM | POA: Diagnosis not present

## 2018-06-26 ENCOUNTER — Inpatient Hospital Stay (HOSPITAL_BASED_OUTPATIENT_CLINIC_OR_DEPARTMENT_OTHER): Payer: Medicare Other | Admitting: Oncology

## 2018-06-26 ENCOUNTER — Inpatient Hospital Stay: Payer: Medicare Other

## 2018-06-26 ENCOUNTER — Encounter: Payer: Self-pay | Admitting: Oncology

## 2018-06-26 ENCOUNTER — Ambulatory Visit
Admission: RE | Admit: 2018-06-26 | Discharge: 2018-06-26 | Disposition: A | Discharge status: Home / Self Care | Payer: Medicare Other | Source: Ambulatory Visit | Attending: Radiation Oncology | Admitting: Radiation Oncology

## 2018-06-26 VITALS — BP 144/49 | HR 95 | Temp 98.2°F | Resp 18 | Ht 65.0 in | Wt 155.1 lb

## 2018-06-26 DIAGNOSIS — R1319 Other dysphagia: Secondary | ICD-10-CM | POA: Diagnosis not present

## 2018-06-26 DIAGNOSIS — D649 Anemia, unspecified: Secondary | ICD-10-CM | POA: Diagnosis not present

## 2018-06-26 DIAGNOSIS — C342 Malignant neoplasm of middle lobe, bronchus or lung: Secondary | ICD-10-CM

## 2018-06-26 DIAGNOSIS — Z510 Encounter for antineoplastic radiation therapy: Secondary | ICD-10-CM | POA: Diagnosis not present

## 2018-06-26 DIAGNOSIS — D709 Neutropenia, unspecified: Secondary | ICD-10-CM | POA: Diagnosis not present

## 2018-06-26 DIAGNOSIS — Z5111 Encounter for antineoplastic chemotherapy: Secondary | ICD-10-CM

## 2018-06-26 DIAGNOSIS — D509 Iron deficiency anemia, unspecified: Secondary | ICD-10-CM

## 2018-06-26 DIAGNOSIS — Z5189 Encounter for other specified aftercare: Secondary | ICD-10-CM | POA: Diagnosis not present

## 2018-06-26 DIAGNOSIS — Z79899 Other long term (current) drug therapy: Secondary | ICD-10-CM | POA: Diagnosis not present

## 2018-06-26 LAB — CBC WITH DIFFERENTIAL (CANCER CENTER ONLY)
ABS IMMATURE GRANULOCYTES: 0.01 10*3/uL (ref 0.00–0.07)
BASOS ABS: 0 10*3/uL (ref 0.0–0.1)
Basophils Relative: 1 %
Eosinophils Absolute: 0 10*3/uL (ref 0.0–0.5)
Eosinophils Relative: 1 %
HCT: 28.6 % — ABNORMAL LOW (ref 36.0–46.0)
Hemoglobin: 8.5 g/dL — ABNORMAL LOW (ref 12.0–15.0)
Immature Granulocytes: 1 %
Lymphocytes Relative: 5 %
Lymphs Abs: 0.1 10*3/uL — ABNORMAL LOW (ref 0.7–4.0)
MCH: 24.7 pg — ABNORMAL LOW (ref 26.0–34.0)
MCHC: 29.7 g/dL — AB (ref 30.0–36.0)
MCV: 83.1 fL (ref 80.0–100.0)
Monocytes Absolute: 0.2 10*3/uL (ref 0.1–1.0)
Monocytes Relative: 9 %
NEUTROS ABS: 1.9 10*3/uL (ref 1.7–7.7)
NRBC: 0 % (ref 0.0–0.2)
Neutrophils Relative %: 83 %
PLATELETS: 107 10*3/uL — AB (ref 150–400)
RBC: 3.44 MIL/uL — AB (ref 3.87–5.11)
RDW: 18.8 % — ABNORMAL HIGH (ref 11.5–15.5)
WBC: 2.2 10*3/uL — AB (ref 4.0–10.5)

## 2018-06-26 LAB — CMP (CANCER CENTER ONLY)
ALK PHOS: 82 U/L (ref 38–126)
ALT: 13 U/L (ref 0–44)
ANION GAP: 7 (ref 5–15)
AST: 18 U/L (ref 15–41)
Albumin: 2.9 g/dL — ABNORMAL LOW (ref 3.5–5.0)
BILIRUBIN TOTAL: 0.3 mg/dL (ref 0.3–1.2)
BUN: 10 mg/dL (ref 8–23)
CHLORIDE: 105 mmol/L (ref 98–111)
CO2: 26 mmol/L (ref 22–32)
Calcium: 8.6 mg/dL — ABNORMAL LOW (ref 8.9–10.3)
Creatinine: 0.88 mg/dL (ref 0.44–1.00)
GFR, Estimated: >60 mL/min (ref >60)
Glucose, Bld: 286 mg/dL — ABNORMAL HIGH (ref 70–99)
POTASSIUM: 3.9 mmol/L (ref 3.5–5.1)
Sodium: 138 mmol/L (ref 135–145)
Total Protein: 7.1 g/dL (ref 6.5–8.1)

## 2018-06-26 MED ORDER — DIPHENHYDRAMINE HCL 50 MG/ML IJ SOLN: 50.0000 mg | Freq: Once | INTRAMUSCULAR | Status: DC

## 2018-06-26 MED ORDER — DIPHENHYDRAMINE HCL 50 MG/ML IJ SOLN
INTRAMUSCULAR | Status: AC
  Filled 2018-06-26: qty 1

## 2018-06-26 MED ORDER — DEXAMETHASONE SODIUM PHOSPHATE 10 MG/ML IJ SOLN
10.0000 mg | Freq: Once | INTRAMUSCULAR | Status: AC
  Administered 2018-06-26: 10 mg via INTRAVENOUS

## 2018-06-26 MED ORDER — DEXAMETHASONE SODIUM PHOSPHATE 10 MG/ML IJ SOLN
INTRAMUSCULAR | Status: AC
  Filled 2018-06-26: qty 1

## 2018-06-26 MED ORDER — SODIUM CHLORIDE 0.9 % IV SOLN
180.2000 mg | Freq: Once | INTRAVENOUS | Status: AC
  Administered 2018-06-26: 180 mg via INTRAVENOUS
  Filled 2018-06-26: qty 18

## 2018-06-26 MED ORDER — PALONOSETRON HCL INJECTION 0.25 MG/5ML
INTRAVENOUS | Status: AC
  Filled 2018-06-26: qty 5

## 2018-06-26 MED ORDER — SODIUM CHLORIDE 0.9 % IV SOLN: 20.0000 mg | Freq: Once | INTRAVENOUS | Status: DC

## 2018-06-26 MED ORDER — PALONOSETRON HCL INJECTION 0.25 MG/5ML
0.2500 mg | Freq: Once | INTRAVENOUS | Status: AC
  Administered 2018-06-26: 0.25 mg via INTRAVENOUS

## 2018-06-26 MED ORDER — FAMOTIDINE IN NACL 20-0.9 MG/50ML-% IV SOLN: 20.0000 mg | Freq: Once | INTRAVENOUS | Status: DC

## 2018-06-26 MED ORDER — SODIUM CHLORIDE 0.9 % IV SOLN
Freq: Once | INTRAVENOUS | Status: AC
  Administered 2018-06-26: 11:00:00 via INTRAVENOUS
  Filled 2018-06-26: qty 250

## 2018-06-26 NOTE — Progress Notes (Signed)
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  PRIOR THERAPY:None  CURRENT THERAPY:Acourse of concurrent chemoradiation with weekly carboplatin for AUC of 2 and paclitaxel 45 MG/M2.First dose of chemotherapy given on 05/29/2018.  Status post 4 cycles.  Taxol was discontinued starting with cycle #4 due to a significant reaction during cycle 3.  INTERVAL HISTORY: Jenna [name] 72 y.o. female returns for routine follow-up visit by herself.  The patient is feeling fine today and has no specific complaints except for mild odynophagia.  She is using Carafate which is helping.  Denies fevers and chills.  Denies chest pain, shortness of breath, cough, hemoptysis.  Denies nausea, vomiting, constipation, diarrhea.  Denies recent weight loss or night sweats.  Denies bleeding.  During the cycle 3 of Taxol, the patient became unresponsive in the hypersensitivity protocol was initiated.  Taxol was discontinued beginning with cycle #4.  She tolerated the carboplatin as a single agent well.  The patient is here for evaluation prior to cycle #5 of her chemotherapy.  MEDICAL HISTORY: Past Medical History:  Diagnosis Date  . DM (diabetes mellitus) ([place])   . GERD (gastroesophageal reflux disease)   . Hypertension   . Iron deficiency anemia     ALLERGIES:  is allergic to paclitaxel; aspirin; esomeprazole magnesium; and ace inhibitors.  MEDICATIONS:  Current Outpatient Medications  Medication Sig Dispense Refill  . dexlansoprazole (DEXILANT) 60 MG capsule 1 BY MOUTH 30 MIN BEFORE BREAKFAST DAILY (Patient taking differently: Take 60 mg by mouth See admin  instructions. 1 BY MOUTH 30 MIN BEFORE BREAKFAST DAILY) 30 capsule 11  . diphenhydrAMINE (BENADRYL) 2 % cream Apply 1 application topically as needed for itching.    . docusate sodium (COLACE) 100 MG capsule Take 100 mg by mouth daily as needed for mild constipation.    . ferrous sulfate 325 (65 FE) MG tablet Take 325 mg by mouth 2 (two) times daily with a meal.     . glipiZIDE (GLUCOTROL) 10 MG tablet Take 1 tablet (10 mg total) by mouth 2 (two) times daily before a meal. 60 tablet 3  . labetalol (NORMODYNE) 100 MG tablet Take 1 tablet (100 mg total) by mouth 2 (two) times daily. 180 tablet 0  . Multiple Vitamin (MULTIVITAMIN WITH MINERALS) TABS tablet Take 1 tablet by mouth at bedtime.     . prochlorperazine (COMPAZINE) 10 MG tablet Take 1 tablet (10 mg total) by mouth every 6 (six) hours as needed for nausea or vomiting. 30 tablet 0  . simvastatin (ZOCOR) 20 MG tablet Take 20 mg by mouth every evening.    . sucralfate (CARAFATE) 1 g tablet Take 1 tablet (1 g total) by mouth 4 (four) times daily. 120 tablet 2   No current facility-administered medications for this visit.    Facility-Administered Medications Ordered in Other Visits  Medication Dose Route Frequency Provider Last Rate Last Dose  . promethazine (PHENERGAN) injection 12.5 mg  12.5 mg Intravenous Once Harle Stanford., PA-C        SURGICAL HISTORY:  Past Surgical History:  Procedure Laterality Date  . CATARACT EXTRACTION Right   . CHOLECYSTECTOMY    . COLONOSCOPY  10/24/2009   normal rectum/1X1cm abnormal lesion in the ascending  colon (bx benign). TI normal for 10cm.  Prep difficult/inadequate. f/u TCS 09/2012 recommended  . COLONOSCOPY  10/13/2004   Normal rectum/Diminutive polyps, splenic flexure, cold biopsied/removed.  Remainder of colonic mucosa appeared normal.  . COLONOSCOPY N/A 12/14/2012   NTI:[id number] polyp-tubular adenoma  . ESOPHAGOGASTRODUODENOSCOPY  10/13/2004    Normal esophagus/ Nodular volcano like lesion in the  antrum, either representing a  pancreatic rest or leiomyoma, biopsied.  Remainder of the gastric mucosa appeared normal, normal D1-D2  . ESOPHAGOGASTRODUODENOSCOPY  10/24/2009   Benign biopsies. normal esophagus/small hiatal hernia/nodular lesion antrum/distal greater curvature. duodenal AVM s/p ablation  . GIVENS CAPSULE STUDY  07/27/2010    multiple arteriovenous malformations which could definitely be the contributor to her drifting hemoglobin and hematocrit  . TUBAL LIGATION    . VIDEO BRONCHOSCOPY WITH ENDOBRONCHIAL ULTRASOUND N/A 04/27/2018   Procedure: VIDEO BRONCHOSCOPY WITH ENDOBRONCHIAL ULTRASOUND;  Surgeon: Melrose Nakayama, MD;  Location: [facility] OR;  Service: Thoracic;  Laterality: N/A;    REVIEW OF SYSTEMS:   Review of Systems  Constitutional: Negative for appetite change, chills, fatigue, fever and unexpected weight change.  HENT:   Negative for mouth sores, nosebleeds.  Positive for mild odynophagia - using Carafate which is effective. Eyes: Negative for eye problems and icterus.  Respiratory: Negative for cough, hemoptysis, shortness of breath and wheezing.   Cardiovascular: Negative for chest pain and leg swelling.  Gastrointestinal: Negative for abdominal pain, constipation, diarrhea, nausea and vomiting.  Genitourinary: Negative for bladder incontinence, difficulty urinating, dysuria, frequency and hematuria.   Musculoskeletal: Negative for back pain, gait problem, neck pain and neck stiffness.  Skin: Negative for itching and rash.  Neurological: Negative for dizziness, extremity weakness, gait problem, headaches, light-headedness and seizures.  Hematological: Negative for adenopathy. Does not bruise/bleed easily.  Psychiatric/Behavioral: Negative for confusion, depression and sleep disturbance. The patient is not nervous/anxious.     PHYSICAL EXAMINATION:  Blood pressure (!) 144/49, pulse 95, temperature 98.2 F (36.8 C), temperature source Oral, resp. rate 18, height  5\' 5"  (1.651 m), weight 155 lb 1.6 oz (70.4 kg), SpO2 99 %.  ECOG PERFORMANCE STATUS: 1 - Symptomatic but completely ambulatory  Physical Exam  Constitutional: Oriented to person, place, and time and well-developed, well-nourished, and in no distress. No distress.  HENT:  Head: Normocephalic and atraumatic.  Mouth/Throat: Oropharynx is clear and moist. No oropharyngeal exudate.  Eyes: Conjunctivae are normal. Right eye exhibits no discharge. Left eye exhibits no discharge. No scleral icterus.  Neck: Normal range of motion. Neck supple.  Cardiovascular: Normal rate, regular rhythm, normal heart sounds and intact distal pulses.   Pulmonary/Chest: Effort normal and breath sounds normal. No respiratory distress. No wheezes. No rales.  Abdominal: Soft. Bowel sounds are normal. Exhibits no distension and no mass. There is no tenderness.  Musculoskeletal: Normal range of motion. Exhibits no edema.  Lymphadenopathy:    No cervical adenopathy.  Neurological: Alert and oriented to person, place, and time. Exhibits normal muscle tone. Gait normal. Coordination normal.  Skin: Skin is warm and dry. No rash noted. Not diaphoretic. No erythema. No pallor.  Psychiatric: Mood, memory and judgment normal.  Vitals reviewed.  LABORATORY DATA: Lab Results  Component Value Date   WBC 2.2 (L) 06/26/2018   HGB 8.5 (L) 06/26/2018   HCT 28.6 (L) 06/26/2018   MCV 83.1 06/26/2018   PLT 107 (L) 06/26/2018      Chemistry      Component Value Date/Time   NA 142 06/19/2018 1113   K  3.5 06/19/2018 1113   CL 105 06/19/2018 1113   CO2 27 06/19/2018 1113   BUN 12 06/19/2018 1113   BUN 12 12/10/2011 0919   CREATININE 0.97 06/19/2018 1113   CREATININE 0.72 12/10/2011 0919      Component Value Date/Time   CALCIUM 8.7 (L) 06/19/2018 1113   ALKPHOS 80 06/19/2018 1113   ALKPHOS 88 12/10/2011 0919   AST 15 06/19/2018 1113   ALT 15 06/19/2018 1113   BILITOT 0.3 06/19/2018 1113       RADIOGRAPHIC  STUDIES:  No results found.   ASSESSMENT/PLAN:  Malignant neoplasm of middle lobe of right lung [facility]) This is a very pleasant 72 year old African-American female recently diagnosed with a stage IIIb non-small cell lung cancer, squamous cell carcinoma.  She is currently undergoing a course of concurrent chemoradiation with weekly carboplatin and paclitaxel status post 4 cycles.  Taxol was discontinued beginning with cycle #4 due to his significant reaction to the medication.  She tolerated single agent carboplatin well overall.  She has mild odynophagia secondary to radiation.  Using Carafate which is effective.  Recommend for her to proceed with cycle #5 of her chemotherapy today as scheduled.  She will return next week for labs and chemotherapy.  She will follow-up for labs and return visit in 2 weeks and at that time will order a restaging CT scan of the chest.   The patient remains anemic with slight improvement of her hemoglobin.  This is likely due to to chemotherapy and her history of iron deficiency anemia.  She reports that she is not taking ferrous sulfate.  She was advised to resume taking ferrous sulfate 2-3 times a day.  We will continue to monitor her hemoglobin closely and consider a blood transfusion if her hemoglobin is 8.0 or less.  She was advised to call immediately if she has any concerning symptoms in the interval. The patient voices understanding of current disease status and treatment options and is in agreement with the current care plan.  All questions were answered. The patient knows to call the clinic with any problems, questions or concerns. We can certainly see the patient much sooner if necessary.   No orders of the defined types were placed in this encounter.    Mikey Bussing, DNP, AGPCNP-BC, AOCNP 06/26/18

## 2018-06-26 NOTE — Patient Instructions (Signed)
[facility] Discharge Instructions for Patients Receiving Chemotherapy  Today you received the following chemotherapy agents Carboplatin  To help prevent nausea and vomiting after your treatment, we encourage you to take your nausea medication as directed   If you develop nausea and vomiting that is not controlled by your nausea medication, call the clinic.   BELOW ARE SYMPTOMS THAT SHOULD BE REPORTED IMMEDIATELY:  *FEVER GREATER THAN 100.5 F  *CHILLS WITH OR WITHOUT FEVER  NAUSEA AND VOMITING THAT IS NOT CONTROLLED WITH YOUR NAUSEA MEDICATION  *UNUSUAL SHORTNESS OF BREATH  *UNUSUAL BRUISING OR BLEEDING  TENDERNESS IN MOUTH AND THROAT WITH OR WITHOUT PRESENCE OF ULCERS  *URINARY PROBLEMS  *BOWEL PROBLEMS  UNUSUAL RASH Items with * indicate a potential emergency and should be followed up as soon as possible.  Feel free to call the clinic should you have any questions or concerns. The clinic phone number is [phone].  Please show the CHEMO ALERT CARD at check-in to the Emergency Department and triage nurse.   

## 2018-06-26 NOTE — Assessment & Plan Note (Addendum)
This is a very pleasant 72 year old African-American female recently diagnosed with a stage IIIb non-small cell lung cancer, squamous cell carcinoma.  She is currently undergoing a course of concurrent chemoradiation with weekly carboplatin and paclitaxel status post 4 cycles.  Taxol was discontinued beginning with cycle #4 due to his significant reaction to the medication.  She tolerated single agent carboplatin well overall.  She has mild odynophagia secondary to radiation.  Using Carafate which is effective.  Recommend for her to proceed with cycle #5 of her chemotherapy today as scheduled.  She will return next week for labs and chemotherapy.  She will follow-up for labs and return visit in 2 weeks and at that time will order a restaging CT scan of the chest.   The patient remains anemic with slight improvement of her hemoglobin.  This is likely due to to chemotherapy and her history of iron deficiency anemia.  She reports that she is not taking ferrous sulfate.  She was advised to resume taking ferrous sulfate 2-3 times a day.  We will continue to monitor her hemoglobin closely and consider a blood transfusion if her hemoglobin is 8.0 or less.  She was advised to call immediately if she has any concerning symptoms in the interval. The patient voices understanding of current disease status and treatment options and is in agreement with the current care plan.  All questions were answered. The patient knows to call the clinic with any problems, questions or concerns. We can certainly see the patient much sooner if necessary.

## 2018-06-27 ENCOUNTER — Ambulatory Visit
Admission: RE | Admit: 2018-06-27 | Discharge: 2018-06-27 | Disposition: A | Discharge status: Home / Self Care | Payer: Medicare Other | Source: Ambulatory Visit | Attending: Radiation Oncology | Admitting: Radiation Oncology

## 2018-06-27 DIAGNOSIS — C342 Malignant neoplasm of middle lobe, bronchus or lung: Secondary | ICD-10-CM | POA: Diagnosis not present

## 2018-06-27 DIAGNOSIS — Z510 Encounter for antineoplastic radiation therapy: Secondary | ICD-10-CM | POA: Diagnosis not present

## 2018-06-28 ENCOUNTER — Ambulatory Visit
Admission: RE | Admit: 2018-06-28 | Discharge: 2018-06-28 | Disposition: A | Discharge status: Home / Self Care | Payer: Medicare Other | Source: Ambulatory Visit | Attending: Radiation Oncology | Admitting: Radiation Oncology

## 2018-06-28 DIAGNOSIS — Z510 Encounter for antineoplastic radiation therapy: Secondary | ICD-10-CM | POA: Diagnosis not present

## 2018-06-28 DIAGNOSIS — C342 Malignant neoplasm of middle lobe, bronchus or lung: Secondary | ICD-10-CM | POA: Diagnosis not present

## 2018-06-29 ENCOUNTER — Ambulatory Visit
Admission: RE | Admit: 2018-06-29 | Discharge: 2018-06-29 | Disposition: A | Discharge status: Home / Self Care | Payer: Medicare Other | Source: Ambulatory Visit | Attending: Radiation Oncology | Admitting: Radiation Oncology

## 2018-06-29 DIAGNOSIS — C342 Malignant neoplasm of middle lobe, bronchus or lung: Secondary | ICD-10-CM | POA: Diagnosis not present

## 2018-06-29 DIAGNOSIS — Z510 Encounter for antineoplastic radiation therapy: Secondary | ICD-10-CM | POA: Diagnosis not present

## 2018-06-30 ENCOUNTER — Ambulatory Visit
Admission: RE | Admit: 2018-06-30 | Discharge: 2018-06-30 | Disposition: A | Discharge status: Home / Self Care | Payer: Medicare Other | Source: Ambulatory Visit | Attending: Radiation Oncology | Admitting: Radiation Oncology

## 2018-06-30 DIAGNOSIS — Z510 Encounter for antineoplastic radiation therapy: Secondary | ICD-10-CM | POA: Diagnosis not present

## 2018-06-30 DIAGNOSIS — C342 Malignant neoplasm of middle lobe, bronchus or lung: Secondary | ICD-10-CM | POA: Diagnosis not present

## 2018-07-03 ENCOUNTER — Ambulatory Visit
Admission: RE | Admit: 2018-07-03 | Discharge: 2018-07-03 | Disposition: A | Discharge status: Home / Self Care | Payer: Medicare Other | Source: Ambulatory Visit | Attending: Radiation Oncology | Admitting: Radiation Oncology

## 2018-07-03 ENCOUNTER — Inpatient Hospital Stay: Payer: Medicare Other | Admitting: Nutrition

## 2018-07-03 ENCOUNTER — Ambulatory Visit: Payer: Medicare Other

## 2018-07-03 ENCOUNTER — Inpatient Hospital Stay: Payer: Medicare Other

## 2018-07-03 DIAGNOSIS — Z510 Encounter for antineoplastic radiation therapy: Secondary | ICD-10-CM | POA: Diagnosis not present

## 2018-07-03 DIAGNOSIS — C342 Malignant neoplasm of middle lobe, bronchus or lung: Secondary | ICD-10-CM | POA: Diagnosis not present

## 2018-07-03 DIAGNOSIS — D709 Neutropenia, unspecified: Secondary | ICD-10-CM | POA: Diagnosis not present

## 2018-07-03 DIAGNOSIS — D649 Anemia, unspecified: Secondary | ICD-10-CM | POA: Diagnosis not present

## 2018-07-03 DIAGNOSIS — Z5111 Encounter for antineoplastic chemotherapy: Secondary | ICD-10-CM | POA: Diagnosis not present

## 2018-07-03 DIAGNOSIS — Z5189 Encounter for other specified aftercare: Secondary | ICD-10-CM | POA: Diagnosis not present

## 2018-07-03 DIAGNOSIS — Z79899 Other long term (current) drug therapy: Secondary | ICD-10-CM | POA: Diagnosis not present

## 2018-07-03 LAB — CMP (CANCER CENTER ONLY)
ALT: 14 U/L (ref 0–44)
ANION GAP: 9 (ref 5–15)
AST: 20 U/L (ref 15–41)
Albumin: 3.1 g/dL — ABNORMAL LOW (ref 3.5–5.0)
Alkaline Phosphatase: 79 U/L (ref 38–126)
BILIRUBIN TOTAL: 0.4 mg/dL (ref 0.3–1.2)
BUN: 13 mg/dL (ref 8–23)
CO2: 24 mmol/L (ref 22–32)
Calcium: 9 mg/dL (ref 8.9–10.3)
Chloride: 107 mmol/L (ref 98–111)
Creatinine: 0.92 mg/dL (ref 0.44–1.00)
Glucose, Bld: 206 mg/dL — ABNORMAL HIGH (ref 70–99)
POTASSIUM: 4.2 mmol/L (ref 3.5–5.1)
Sodium: 140 mmol/L (ref 135–145)
TOTAL PROTEIN: 7.2 g/dL (ref 6.5–8.1)

## 2018-07-03 LAB — CBC WITH DIFFERENTIAL (CANCER CENTER ONLY)
ABS IMMATURE GRANULOCYTES: 0 10*3/uL (ref 0.00–0.07)
BASOS PCT: 0 %
Basophils Absolute: 0 10*3/uL (ref 0.0–0.1)
EOS ABS: 0 10*3/uL (ref 0.0–0.5)
Eosinophils Relative: 1 %
HCT: 25.7 % — ABNORMAL LOW (ref 36.0–46.0)
Hemoglobin: 8 g/dL — ABNORMAL LOW (ref 12.0–15.0)
IMMATURE GRANULOCYTES: 0 %
Lymphocytes Relative: 9 %
Lymphs Abs: 0.1 10*3/uL — ABNORMAL LOW (ref 0.7–4.0)
MCH: 25.7 pg — AB (ref 26.0–34.0)
MCHC: 31.1 g/dL (ref 30.0–36.0)
MCV: 82.6 fL (ref 80.0–100.0)
Monocytes Absolute: 0.1 10*3/uL (ref 0.1–1.0)
Monocytes Relative: 10 %
NEUTROS ABS: 0.9 10*3/uL — AB (ref 1.7–7.7)
NEUTROS PCT: 80 %
NRBC: 0 % (ref 0.0–0.2)
PLATELETS: 45 10*3/uL — AB (ref 150–400)
RBC: 3.11 MIL/uL — AB (ref 3.87–5.11)
RDW: 19 % — AB (ref 11.5–15.5)
WBC: 1.1 10*3/uL — AB (ref 4.0–10.5)

## 2018-07-03 NOTE — Progress Notes (Unsigned)
Per Dr. Julien Nordmann, no treatment today due to [place] 0.9 and Plt 45.  Pt given information on thrombocytopenia and leukopenia.

## 2018-07-03 NOTE — Patient Instructions (Signed)
Thrombocytopenia  Thrombocytopenia means that you have a low number of platelets in your blood. Platelets are tiny cells in the blood. When you bleed, they clump together at the cut or injury to stop the bleeding. This is called blood clotting. Not having enough platelets can cause bleeding problems.  Follow these instructions at home:  General instructions  · Check your skin and inside your mouth for bruises or blood as told by your doctor.  · Check to see if there is blood in your spit (sputum), pee (urine), and poop (stool). Do this as told by your doctor.  · Ask your doctor if you can drink alcohol.  · Take over-the-counter and prescription medicines only as told by your doctor.  · Tell all of your doctors that you have this condition. Be sure to tell your dentist and eye doctor too.  Activity  · Do not do activities that can cause bumps or bruises until your doctor says it is okay.  · Be careful not to cut yourself:  ? When you shave.  ? When you use scissors, needles, knives, or other tools.  · Be careful not to burn yourself:  ? When you use an iron.  ? When you cook.  Contact a doctor if:  · You have bruises and you do not know why.  Get help right away if:  · You are bleeding anywhere on your body.  · You have blood in your spit, pee, or poop.  This information is not intended to replace advice given to you by your health care provider. Make sure you discuss any questions you have with your health care provider.  Document Released: 07/22/2011 Document Revised: 04/04/2016 Document Reviewed: 02/03/2015  Elsevier Interactive Patient Education © 2018 [employer] Inc[employer]        Leukopenia  Leukopenia is a condition in which you have a low number of white blood cells. White blood cells help the body to fight infections. The number of white blood cells in the body varies from person to person.  There are five types of white blood cells. Two types (lymphocytes and neutrophils) make up most of the white blood cell count.  When lymphocytes are low, the condition is called lymphocytopenia. When neutrophils are low, it is called neutropenia. Neutropenia is the most dangerous type of leukopenia because it can lead to dangerous infections.  What are the causes?  This condition is commonly caused by damage to soft tissue inside of the bones (bone marrow), which is where most white blood cells are made. Bone marrow can get damaged by:  · Medicine or X-ray treatments for cancer (chemotherapy or radiation therapy).  · Serious infections.  · Cancer of the white blood cells (leukemia, lymphoma, or myeloma).  · Medicines, including:  ? Certain antibiotics.  ? Certain heart medicines.  ? Steroids.  ? Certain medicines used to treat diseases of the immune system (autoimmune diseases), like rheumatoid arthritis.    Leukopenia also happens when white blood cells are destroyed after leaving the bone marrow, which may result from:  · Liver disease.  · Autoimmune disease.  · Vitamin B deficiencies.    What are the signs or symptoms?  One of the most common signs of leukopenia, especially severe neutropenia, is having a lot of bacterial infections. Different infections have different symptoms. An infection in your lungs may cause coughing. A urinary tract infection may cause frequent urination and a burning sensation. You may also get infections of the blood, skin,   rectum, throat, sinuses, or ears.  Some people have no symptoms. If you do have symptoms, they may include:  · Fever.  · Fatigue.  · Swollen glands (lymph nodes).  · Painful mouth ulcers.  · Gum disease.    How is this diagnosed?  This condition may be diagnosed based on:  · Your medical history.  · A physical exam to check for swollen lymph nodes and an enlarged spleen. Your spleen is an organ on the left side of your body that stores white blood cells.  · Tests, such as:  ? A complete blood count. This blood test counts each type of white cell.  ? Bone marrow aspiration. Some bone marrow is  removed to be checked under a microscope.  ? Lymph node biopsy. Some lymph node tissue is removed to be checked under a microscope.  ? Other types of blood tests or imaging tests.    How is this treated?  Treatment of leukopenia depends on the cause. Some common treatments include:  · Antibiotic medicine to treat bacterial infections.  · Stopping medicines that may cause leukopenia.  · Medicines to stimulate neutrophil production (hematopoietic growth factors), to treat neutropenia.    Follow these instructions at home:  · Take over-the-counter and prescription medicines only as told by your health care provider. This includes supplements and vitamins.  · If you were prescribed an antibiotic medicine, take it as told by your health care provider. Do not stop taking the antibiotic even if you start to feel better.  · Preventing infection is important if you have leukopenia. To prevent infection:  ? Avoid close contact with sick people.  ? Wash your hands frequently with soap and water. If soap and water are not available, use hand sanitizer.  ? Do not&nbsp;eat uncooked or undercooked meats.  ? Wash fruits and vegetables before eating them.  ? Do not eat or drink unpasteurized dairy products.  ? Get regular dental care, and maintain good dental hygiene. You should visit the dentist at least once every 6 months.  · Keep all follow-up visits as told by your health care provider. This is important.  Contact a health care provider if:  · You have chills or a fever.  · You have symptoms of an infection.  Get help right away if:  · You have a fever that lasts for more than 2-3 days.  · You have symptoms that last for more than 2-3 days.  · You have trouble breathing.  · You have chest pain.  This information is not intended to replace advice given to you by your health care provider. Make sure you discuss any questions you have with your health care provider.  Document Released: 08/07/2013 Document Revised: 06/22/2016  Document Reviewed: 06/22/2016  Elsevier Interactive Patient Education © 2018 Elsevier Inc[employer]

## 2018-07-04 ENCOUNTER — Ambulatory Visit: Payer: Medicare Other

## 2018-07-04 ENCOUNTER — Ambulatory Visit
Admission: RE | Admit: 2018-07-04 | Discharge: 2018-07-04 | Disposition: A | Discharge status: Home / Self Care | Payer: Medicare Other | Source: Ambulatory Visit | Attending: Radiation Oncology | Admitting: Radiation Oncology

## 2018-07-04 DIAGNOSIS — C342 Malignant neoplasm of middle lobe, bronchus or lung: Secondary | ICD-10-CM | POA: Diagnosis not present

## 2018-07-04 DIAGNOSIS — Z510 Encounter for antineoplastic radiation therapy: Secondary | ICD-10-CM | POA: Diagnosis not present

## 2018-07-05 ENCOUNTER — Ambulatory Visit: Payer: Medicare Other

## 2018-07-05 ENCOUNTER — Ambulatory Visit
Admission: RE | Admit: 2018-07-05 | Discharge: 2018-07-05 | Disposition: A | Discharge status: Home / Self Care | Payer: Medicare Other | Source: Ambulatory Visit | Attending: Radiation Oncology | Admitting: Radiation Oncology

## 2018-07-05 DIAGNOSIS — C342 Malignant neoplasm of middle lobe, bronchus or lung: Secondary | ICD-10-CM | POA: Diagnosis not present

## 2018-07-05 DIAGNOSIS — Z510 Encounter for antineoplastic radiation therapy: Secondary | ICD-10-CM | POA: Diagnosis not present

## 2018-07-06 ENCOUNTER — Ambulatory Visit: Payer: Medicare Other

## 2018-07-06 ENCOUNTER — Ambulatory Visit
Admission: RE | Admit: 2018-07-06 | Discharge: 2018-07-06 | Disposition: A | Discharge status: Home / Self Care | Payer: Medicare Other | Source: Ambulatory Visit | Attending: Radiation Oncology | Admitting: Radiation Oncology

## 2018-07-06 DIAGNOSIS — C342 Malignant neoplasm of middle lobe, bronchus or lung: Secondary | ICD-10-CM | POA: Diagnosis not present

## 2018-07-06 DIAGNOSIS — Z510 Encounter for antineoplastic radiation therapy: Secondary | ICD-10-CM | POA: Diagnosis not present

## 2018-07-07 ENCOUNTER — Ambulatory Visit: Payer: Medicare Other

## 2018-07-10 ENCOUNTER — Other Ambulatory Visit: Payer: Self-pay | Admitting: Family Medicine

## 2018-07-10 ENCOUNTER — Ambulatory Visit: Payer: Medicare Other

## 2018-07-10 ENCOUNTER — Other Ambulatory Visit: Payer: Medicare Other

## 2018-07-10 ENCOUNTER — Ambulatory Visit
Admission: RE | Admit: 2018-07-10 | Discharge: 2018-07-10 | Disposition: A | Discharge status: Home / Self Care | Payer: Medicare Other | Source: Ambulatory Visit | Attending: Radiation Oncology | Admitting: Radiation Oncology

## 2018-07-10 DIAGNOSIS — Z510 Encounter for antineoplastic radiation therapy: Secondary | ICD-10-CM | POA: Diagnosis not present

## 2018-07-10 DIAGNOSIS — C342 Malignant neoplasm of middle lobe, bronchus or lung: Secondary | ICD-10-CM | POA: Diagnosis not present

## 2018-07-10 DIAGNOSIS — I160 Hypertensive urgency: Secondary | ICD-10-CM

## 2018-07-10 DIAGNOSIS — I10 Essential (primary) hypertension: Secondary | ICD-10-CM

## 2018-07-10 NOTE — Telephone Encounter (Signed)
Copied from [place] [id number]. Topic: Quick Communication - Rx Refill/Question >> Jul 10, 2018  3:55 PM Leward Quan A wrote: Medication: labetalol (NORMODYNE) 100 MG tablet  Has the patient contacted their pharmacy? Yes.   (Agent: If no, request that the patient contact the pharmacy for the refill.) (Agent: If yes, when and what did the pharmacy advise?)  Preferred Pharmacy (with phone number or street name): [place], [place] - [address] [phone] (Phone) [phone] (Fax)    Agent: Please be advised that RX refills may take up to 3 business days. We ask that you follow-up with your pharmacy.

## 2018-07-11 MED ORDER — LABETALOL HCL 100 MG PO TABS: 100.0000 mg | ORAL_TABLET | Freq: Two times a day (BID) | ORAL | 0 refills | Status: DC

## 2018-07-11 NOTE — Telephone Encounter (Signed)
Requested medication (s) are due for refill today: yes  Requested medication (s) are on the active medication list: no  Last refill:  04/06/18  Future visit scheduled: no  Notes to clinic:  Not on active medication list    Requested Prescriptions  Pending Prescriptions Disp Refills   labetalol (NORMODYNE) 100 MG tablet 180 tablet 0    Sig: Take 1 tablet (100 mg total) by mouth 2 (two) times daily.     Cardiovascular:  Beta Blockers Failed - 07/11/2018 12:33 PM      Failed - Last BP in normal range    BP Readings from Last 1 Encounters:  06/26/18 (!) 144/49         Passed - Last Heart Rate in normal range    Pulse Readings from Last 1 Encounters:  06/26/18 95         Passed - Valid encounter within last 6 months    Recent Outpatient Visits          3 months ago [place]   [facility] Loran Senters, [place] M, MD   3 months ago Diabetes mellitus without complication [facility])   [facility] [address], Marciano Sequin, MD

## 2018-07-12 ENCOUNTER — Inpatient Hospital Stay (HOSPITAL_BASED_OUTPATIENT_CLINIC_OR_DEPARTMENT_OTHER): Payer: Medicare Other | Admitting: Internal Medicine

## 2018-07-12 ENCOUNTER — Other Ambulatory Visit: Payer: Self-pay | Admitting: Medical Oncology

## 2018-07-12 ENCOUNTER — Inpatient Hospital Stay: Payer: Medicare Other

## 2018-07-12 ENCOUNTER — Inpatient Hospital Stay: Payer: Medicare Other | Admitting: Lab

## 2018-07-12 VITALS — BP 124/62 | HR 102 | Temp 97.5°F | Resp 18 | Ht 65.0 in | Wt 146.7 lb

## 2018-07-12 DIAGNOSIS — R634 Abnormal weight loss: Secondary | ICD-10-CM | POA: Diagnosis not present

## 2018-07-12 DIAGNOSIS — R070 Pain in throat: Secondary | ICD-10-CM

## 2018-07-12 DIAGNOSIS — D709 Neutropenia, unspecified: Secondary | ICD-10-CM | POA: Diagnosis not present

## 2018-07-12 DIAGNOSIS — D702 Other drug-induced agranulocytosis: Secondary | ICD-10-CM

## 2018-07-12 DIAGNOSIS — C3490 Malignant neoplasm of unspecified part of unspecified bronchus or lung: Secondary | ICD-10-CM

## 2018-07-12 DIAGNOSIS — C342 Malignant neoplasm of middle lobe, bronchus or lung: Secondary | ICD-10-CM

## 2018-07-12 DIAGNOSIS — Z79899 Other long term (current) drug therapy: Secondary | ICD-10-CM | POA: Diagnosis not present

## 2018-07-12 DIAGNOSIS — Z5111 Encounter for antineoplastic chemotherapy: Secondary | ICD-10-CM

## 2018-07-12 DIAGNOSIS — D649 Anemia, unspecified: Secondary | ICD-10-CM | POA: Diagnosis not present

## 2018-07-12 DIAGNOSIS — Z5189 Encounter for other specified aftercare: Secondary | ICD-10-CM | POA: Diagnosis not present

## 2018-07-12 LAB — CMP (CANCER CENTER ONLY)
ALT: 14 U/L (ref 0–44)
ANION GAP: 12 (ref 5–15)
AST: 25 U/L (ref 15–41)
Albumin: 3.2 g/dL — ABNORMAL LOW (ref 3.5–5.0)
Alkaline Phosphatase: 85 U/L (ref 38–126)
BILIRUBIN TOTAL: 0.4 mg/dL (ref 0.3–1.2)
BUN: 17 mg/dL (ref 8–23)
CHLORIDE: 105 mmol/L (ref 98–111)
CO2: 24 mmol/L (ref 22–32)
Calcium: 9.4 mg/dL (ref 8.9–10.3)
Creatinine: 1.19 mg/dL — ABNORMAL HIGH (ref 0.44–1.00)
GFR, Est AFR Am: 53 mL/min — ABNORMAL LOW (ref >60)
GFR, Estimated: 46 mL/min — ABNORMAL LOW (ref >60)
GLUCOSE: 256 mg/dL — AB (ref 70–99)
POTASSIUM: 4.1 mmol/L (ref 3.5–5.1)
Sodium: 141 mmol/L (ref 135–145)
TOTAL PROTEIN: 7.4 g/dL (ref 6.5–8.1)

## 2018-07-12 LAB — CBC WITH DIFFERENTIAL (CANCER CENTER ONLY)
Abs Immature Granulocytes: 0 10*3/uL (ref 0.00–0.07)
BASOS ABS: 0 10*3/uL (ref 0.0–0.1)
Basophils Relative: 0 %
EOS ABS: 0 10*3/uL (ref 0.0–0.5)
Eosinophils Relative: 1 %
HCT: 23.2 % — ABNORMAL LOW (ref 36.0–46.0)
Hemoglobin: 7.1 g/dL — ABNORMAL LOW (ref 12.0–15.0)
IMMATURE GRANULOCYTES: 0 %
LYMPHS ABS: 0.1 10*3/uL — AB (ref 0.7–4.0)
LYMPHS PCT: 16 %
MCH: 25.3 pg — ABNORMAL LOW (ref 26.0–34.0)
MCHC: 30.6 g/dL (ref 30.0–36.0)
MCV: 82.6 fL (ref 80.0–100.0)
Monocytes Absolute: 0.1 10*3/uL (ref 0.1–1.0)
Monocytes Relative: 15 %
NEUTROS PCT: 68 %
NRBC: 2.7 % — AB (ref 0.0–0.2)
Neutro Abs: 0.5 10*3/uL — ABNORMAL LOW (ref 1.7–7.7)
PLATELETS: 92 10*3/uL — AB (ref 150–400)
RBC: 2.81 MIL/uL — AB (ref 3.87–5.11)
RDW: 19.5 % — AB (ref 11.5–15.5)
WBC Count: 0.7 10*3/uL — CL (ref 4.0–10.5)

## 2018-07-12 LAB — ABO/RH: ABO/RH(D): O POS

## 2018-07-12 MED ORDER — TBO-FILGRASTIM 300 MCG/0.5ML ~~LOC~~ SOSY: 300.0000 ug | PREFILLED_SYRINGE | Freq: Once | SUBCUTANEOUS | Status: DC

## 2018-07-12 MED ORDER — TBO-FILGRASTIM 300 MCG/0.5ML ~~LOC~~ SOSY
300.0000 ug | PREFILLED_SYRINGE | Freq: Once | SUBCUTANEOUS | Status: AC
  Administered 2018-07-12: 300 ug via SUBCUTANEOUS

## 2018-07-12 MED ORDER — TBO-FILGRASTIM 300 MCG/0.5ML ~~LOC~~ SOSY
PREFILLED_SYRINGE | SUBCUTANEOUS | Status: AC
  Filled 2018-07-12: qty 0.5

## 2018-07-12 NOTE — Progress Notes (Signed)
[facility] [phone]   [phone]  OFFICE PROGRESS NOTE  [name], MD [place] [address]  DIAGNOSIS: Stage IIIB (T3,N3, M0)non-small cell lung cancer, squamous cell carcinoma presented with large right middle lobe lung breast in addition to mediastinal and bilateral hilar lymphadenopathy and suspicious right supraclavicular lymph node diagnosed in August 2019.   PRIOR THERAPY: None  CURRENT THERAPY: A course of concurrent chemoradiation with weekly carboplatin for AUC of 2 and paclitaxel 45 MG/M2.  First dose of chemotherapy given on 05/29/2018.  Status post 5 cycles.  INTERVAL HISTORY: Jenna [name] 72 y.o. female returns to the clinic today for follow-up visit that the patient is feeling fine today with no concerning complaints.  She completed a course of concurrent chemoradiation with weekly carboplatin and paclitaxel.  She missed the last dose of her treatment because of neutropenia.  She denied having any current chest pain, shortness of breath but has cough with no hemoptysis.  She also has some sore throat.  She denied having any fever or chills.  She lost more than 20 pounds in the last few weeks.  The patient is here today for evaluation and repeat blood work.   MEDICAL HISTORY: Past Medical History:  Diagnosis Date  . DM (diabetes mellitus) ([place])   . GERD (gastroesophageal reflux disease)   . Hypertension   . Iron deficiency anemia     ALLERGIES:  is allergic to paclitaxel; aspirin; esomeprazole magnesium; and ace inhibitors.  MEDICATIONS:  Current Outpatient Medications  Medication Sig Dispense Refill  . dexlansoprazole (DEXILANT) 60 MG capsule 1 BY MOUTH 30 MIN BEFORE BREAKFAST DAILY (Patient taking differently: Take 60 mg by mouth See admin instructions. 1 BY MOUTH 30 MIN BEFORE BREAKFAST DAILY) 30 capsule 11  . diphenhydrAMINE (BENADRYL) 2 % cream Apply 1 application topically as  needed for itching.    . docusate sodium (COLACE) 100 MG capsule Take 100 mg by mouth daily as needed for mild constipation.    . ferrous sulfate 325 (65 FE) MG tablet Take 325 mg by mouth 2 (two) times daily with a meal.     . glipiZIDE (GLUCOTROL) 10 MG tablet Take 1 tablet (10 mg total) by mouth 2 (two) times daily before a meal. 60 tablet 3  . labetalol (NORMODYNE) 100 MG tablet Take 1 tablet (100 mg total) by mouth 2 (two) times daily. 180 tablet 0  . Multiple Vitamin (MULTIVITAMIN WITH MINERALS) TABS tablet Take 1 tablet by mouth at bedtime.     . prochlorperazine (COMPAZINE) 10 MG tablet Take 1 tablet (10 mg total) by mouth every 6 (six) hours as needed for nausea or vomiting. 30 tablet 0  . simvastatin (ZOCOR) 20 MG tablet Take 20 mg by mouth every evening.    . sucralfate (CARAFATE) 1 g tablet Take 1 tablet (1 g total) by mouth 4 (four) times daily. 120 tablet 2   No current facility-administered medications for this visit.    Facility-Administered Medications Ordered in Other Visits  Medication Dose Route Frequency Provider Last Rate Last Dose  . promethazine (PHENERGAN) injection 12.5 mg  12.5 mg Intravenous Once Harle Stanford., PA-C        SURGICAL HISTORY:  Past Surgical History:  Procedure Laterality Date  . CATARACT EXTRACTION Right   . CHOLECYSTECTOMY    . COLONOSCOPY  10/24/2009   normal rectum/1X1cm abnormal lesion in the ascending colon (bx benign). TI normal for 10cm.  Prep difficult/inadequate. f/u TCS 09/2012 recommended  . COLONOSCOPY  10/13/2004   Normal rectum/Diminutive polyps, splenic flexure, cold biopsied/removed.  Remainder of colonic mucosa appeared normal.  . COLONOSCOPY N/A 12/14/2012   VVO:[id number] polyp-tubular adenoma  . ESOPHAGOGASTRODUODENOSCOPY  10/13/2004    Normal esophagus/ Nodular volcano like lesion in the antrum, either representing a  pancreatic rest or leiomyoma, biopsied.  Remainder of the gastric mucosa appeared normal, normal D1-D2  .  ESOPHAGOGASTRODUODENOSCOPY  10/24/2009   Benign biopsies. normal esophagus/small hiatal hernia/nodular lesion antrum/distal greater curvature. duodenal AVM s/p ablation  . GIVENS CAPSULE STUDY  07/27/2010    multiple arteriovenous malformations which could definitely be the contributor to her drifting hemoglobin and hematocrit  . TUBAL LIGATION    . VIDEO BRONCHOSCOPY WITH ENDOBRONCHIAL ULTRASOUND N/A 04/27/2018   Procedure: VIDEO BRONCHOSCOPY WITH ENDOBRONCHIAL ULTRASOUND;  Surgeon: Melrose Nakayama, MD;  Location: [facility];  Service: Thoracic;  Laterality: N/A;    REVIEW OF SYSTEMS:  A comprehensive review of systems was negative except for: Constitutional: positive for fatigue Ears, nose, mouth, throat, and face: positive for sore throat Gastrointestinal: positive for odynophagia   PHYSICAL EXAMINATION: General appearance: alert, cooperative, fatigued and no distress Head: Normocephalic, without obvious abnormality, atraumatic Neck: no adenopathy, no JVD, supple, symmetrical, trachea midline and thyroid not enlarged, symmetric, no tenderness/mass/nodules Lymph nodes: Cervical, supraclavicular, and axillary nodes normal. Resp: clear to auscultation bilaterally Back: symmetric, no curvature. ROM normal. No CVA tenderness. Cardio: regular rate and rhythm, S1, S2 normal, no murmur, click, rub or gallop GI: soft, non-tender; bowel sounds normal; no masses,  no organomegaly Extremities: extremities normal, atraumatic, no cyanosis or edema  ECOG PERFORMANCE STATUS: 1 - Symptomatic but completely ambulatory  Blood pressure 124/62, pulse (!) 102, temperature (!) 97.5 F (36.4 C), temperature source Oral, resp. rate 18, height 5\' 5"  (1.651 m), weight 146 lb 11.2 oz (66.5 kg), SpO2 100 %.  LABORATORY DATA: Lab Results  Component Value Date   WBC 0.7 (LL) 07/12/2018   HGB 7.1 (L) 07/12/2018   HCT 23.2 (L) 07/12/2018   MCV 82.6 07/12/2018   PLT 92 (L) 07/12/2018      Chemistry        Component Value Date/Time   NA 141 07/12/2018 1059   K 4.1 07/12/2018 1059   CL 105 07/12/2018 1059   CO2 24 07/12/2018 1059   BUN 17 07/12/2018 1059   BUN 12 12/10/2011 0919   CREATININE 1.19 (H) 07/12/2018 1059   CREATININE 0.72 12/10/2011 0919      Component Value Date/Time   CALCIUM 9.4 07/12/2018 1059   ALKPHOS 85 07/12/2018 1059   ALKPHOS 88 12/10/2011 0919   AST 25 07/12/2018 1059   ALT 14 07/12/2018 1059   BILITOT 0.4 07/12/2018 1059       RADIOGRAPHIC STUDIES: No results found.  ASSESSMENT AND PLAN: This is a very pleasant 72 years old African-American female recently diagnosed with a stage IIIb non-small cell lung cancer, squamous cell carcinoma.  She is currently undergoing a course of concurrent chemoradiation with weekly carboplatin and paclitaxel status post 5 cycles.  The patient is feeling fine today with no concerning complaints.  CBC today showed significant neutropenia.  I will arrange for the patient to receive 2 doses of Granix today and tomorrow. I will see the patient back for follow-up visit in around 4 weeks with repeat CT scan of the chest for restaging of her disease. The patient was advised to call immediately if she has any concerning  symptoms in the interval. The patient voices understanding of current disease status and treatment options and is in agreement with the current care plan. All questions were answered. The patient knows to call the clinic with any problems, questions or concerns. We can certainly see the patient much sooner if necessary.  I spent 10 minutes counseling the patient face to face. The total time spent in the appointment was 15 minutes.  Disclaimer: This note was dictated with voice recognition software. Similar sounding words can inadvertently be transcribed and may not be corrected upon review.

## 2018-07-13 ENCOUNTER — Inpatient Hospital Stay: Payer: Medicare Other

## 2018-07-13 DIAGNOSIS — D709 Neutropenia, unspecified: Secondary | ICD-10-CM | POA: Diagnosis not present

## 2018-07-13 DIAGNOSIS — Z79899 Other long term (current) drug therapy: Secondary | ICD-10-CM | POA: Diagnosis not present

## 2018-07-13 DIAGNOSIS — D702 Other drug-induced agranulocytosis: Secondary | ICD-10-CM

## 2018-07-13 DIAGNOSIS — Z5189 Encounter for other specified aftercare: Secondary | ICD-10-CM | POA: Diagnosis not present

## 2018-07-13 DIAGNOSIS — Z5111 Encounter for antineoplastic chemotherapy: Secondary | ICD-10-CM | POA: Diagnosis not present

## 2018-07-13 DIAGNOSIS — D649 Anemia, unspecified: Secondary | ICD-10-CM | POA: Diagnosis not present

## 2018-07-13 DIAGNOSIS — C342 Malignant neoplasm of middle lobe, bronchus or lung: Secondary | ICD-10-CM | POA: Diagnosis not present

## 2018-07-13 MED ORDER — TBO-FILGRASTIM 300 MCG/0.5ML ~~LOC~~ SOSY
300.0000 ug | PREFILLED_SYRINGE | Freq: Once | SUBCUTANEOUS | Status: AC
  Administered 2018-07-13: 300 ug via SUBCUTANEOUS

## 2018-07-13 MED ORDER — TBO-FILGRASTIM 300 MCG/0.5ML ~~LOC~~ SOSY
PREFILLED_SYRINGE | SUBCUTANEOUS | Status: AC
  Filled 2018-07-13: qty 0.5

## 2018-07-14 ENCOUNTER — Encounter: Payer: Self-pay | Admitting: Internal Medicine

## 2018-07-15 ENCOUNTER — Inpatient Hospital Stay: Payer: Medicare Other

## 2018-07-15 DIAGNOSIS — C3490 Malignant neoplasm of unspecified part of unspecified bronchus or lung: Secondary | ICD-10-CM

## 2018-07-15 DIAGNOSIS — Z79899 Other long term (current) drug therapy: Secondary | ICD-10-CM | POA: Diagnosis not present

## 2018-07-15 DIAGNOSIS — D649 Anemia, unspecified: Secondary | ICD-10-CM | POA: Diagnosis not present

## 2018-07-15 DIAGNOSIS — Z5189 Encounter for other specified aftercare: Secondary | ICD-10-CM | POA: Diagnosis not present

## 2018-07-15 DIAGNOSIS — Z5111 Encounter for antineoplastic chemotherapy: Secondary | ICD-10-CM | POA: Diagnosis not present

## 2018-07-15 DIAGNOSIS — C342 Malignant neoplasm of middle lobe, bronchus or lung: Secondary | ICD-10-CM | POA: Diagnosis not present

## 2018-07-15 DIAGNOSIS — D709 Neutropenia, unspecified: Secondary | ICD-10-CM | POA: Diagnosis not present

## 2018-07-15 LAB — PREPARE RBC (CROSSMATCH)

## 2018-07-15 MED ORDER — ACETAMINOPHEN 325 MG PO TABS
650.0000 mg | ORAL_TABLET | Freq: Once | ORAL | Status: AC
  Administered 2018-07-15: 650 mg via ORAL

## 2018-07-15 MED ORDER — ACETAMINOPHEN 325 MG PO TABS
ORAL_TABLET | ORAL | Status: AC
  Filled 2018-07-15: qty 2

## 2018-07-15 MED ORDER — DIPHENHYDRAMINE HCL 25 MG PO CAPS
25.0000 mg | ORAL_CAPSULE | Freq: Once | ORAL | Status: AC
  Administered 2018-07-15: 25 mg via ORAL

## 2018-07-15 MED ORDER — SODIUM CHLORIDE 0.9% IV SOLUTION
250.0000 mL | Freq: Once | INTRAVENOUS | Status: AC
  Administered 2018-07-15: 250 mL via INTRAVENOUS
  Filled 2018-07-15: qty 250

## 2018-07-15 MED ORDER — DIPHENHYDRAMINE HCL 25 MG PO CAPS
ORAL_CAPSULE | ORAL | Status: AC
  Filled 2018-07-15: qty 1

## 2018-07-15 NOTE — Patient Instructions (Signed)

## 2018-07-17 LAB — TYPE AND SCREEN
ABO/RH(D): O POS
ANTIBODY SCREEN: NEGATIVE
UNIT DIVISION: 0
Unit division: 0

## 2018-07-17 LAB — BPAM RBC
BLOOD PRODUCT EXPIRATION DATE: 201912232359
Blood Product Expiration Date: 201912232359
ISSUE DATE / TIME: 201911300751
ISSUE DATE / TIME: 201911300751
UNIT TYPE AND RH: 5100
Unit Type and Rh: 5100

## 2018-07-21 ENCOUNTER — Encounter: Payer: Self-pay | Admitting: Radiation Oncology

## 2018-07-21 NOTE — Progress Notes (Signed)
  Radiation Oncology         [phone] ________________________________  Name: Jenna [name] MRN: [id number]  Date: 07/21/2018  DOB: 10-Jun-1946  End of Treatment Note  Diagnosis:  Lung cancer     Indication for treatment::  curative       Radiation treatment dates:   05/24/18 - 07/10/18  Site/dose:   The patient was treated to the disease within the right lung initially to a dose of 60 Gy using a 4 field, 3-D conformal technique. The patient then received a cone down boost treatment for an additional 6 Gy. This yielded a final total dose of 66 Gy.   Narrative: The patient tolerated radiation treatment relatively well.   The patient did not experience esophagitis during the course of treatment. She reported some difficulty swallowing, fatigue, and hemoptysis.  Plan: The patient has completed radiation treatment. The patient will return to radiation oncology clinic for routine followup in one month. I advised the patient to call or return sooner if they have any questions or concerns related to their recovery or treatment. ________________________________  Jodelle Gross, M.D., Ph.D.  This document serves as a record of services personally performed by Kyung Rudd, MD. It was created on his behalf by Wilburn Mylar, a trained medical scribe. The creation of this record is based on the scribe's personal observations and the provider's statements to them. This document has been checked and approved by the attending provider.

## 2018-07-31 ENCOUNTER — Inpatient Hospital Stay: Payer: Medicare Other | Attending: Internal Medicine

## 2018-07-31 ENCOUNTER — Other Ambulatory Visit: Payer: Medicare Other

## 2018-07-31 ENCOUNTER — Ambulatory Visit (HOSPITAL_COMMUNITY)
Admission: RE | Admit: 2018-07-31 | Discharge: 2018-07-31 | Disposition: A | Discharge status: Home / Self Care | Payer: Medicare Other | Source: Ambulatory Visit | Attending: Internal Medicine | Admitting: Internal Medicine

## 2018-07-31 DIAGNOSIS — C342 Malignant neoplasm of middle lobe, bronchus or lung: Secondary | ICD-10-CM | POA: Diagnosis not present

## 2018-07-31 DIAGNOSIS — Z5112 Encounter for antineoplastic immunotherapy: Secondary | ICD-10-CM | POA: Diagnosis not present

## 2018-07-31 DIAGNOSIS — D61818 Other pancytopenia: Secondary | ICD-10-CM | POA: Insufficient documentation

## 2018-07-31 DIAGNOSIS — C3490 Malignant neoplasm of unspecified part of unspecified bronchus or lung: Secondary | ICD-10-CM | POA: Insufficient documentation

## 2018-07-31 DIAGNOSIS — R5383 Other fatigue: Secondary | ICD-10-CM | POA: Insufficient documentation

## 2018-07-31 DIAGNOSIS — D702 Other drug-induced agranulocytosis: Secondary | ICD-10-CM | POA: Insufficient documentation

## 2018-07-31 DIAGNOSIS — Z79899 Other long term (current) drug therapy: Secondary | ICD-10-CM | POA: Insufficient documentation

## 2018-07-31 LAB — CMP (CANCER CENTER ONLY)
ALT: 11 U/L (ref 0–44)
AST: 17 U/L (ref 15–41)
Albumin: 3.4 g/dL — ABNORMAL LOW (ref 3.5–5.0)
Alkaline Phosphatase: 86 U/L (ref 38–126)
Anion gap: 10 (ref 5–15)
BUN: 17 mg/dL (ref 8–23)
CO2: 24 mmol/L (ref 22–32)
Calcium: 9.8 mg/dL (ref 8.9–10.3)
Chloride: 107 mmol/L (ref 98–111)
Creatinine: 0.83 mg/dL (ref 0.44–1.00)
GFR, Est AFR Am: >60 mL/min (ref >60)
GFR, Estimated: >60 mL/min (ref >60)
Glucose, Bld: 78 mg/dL (ref 70–99)
Potassium: 4.1 mmol/L (ref 3.5–5.1)
Sodium: 141 mmol/L (ref 135–145)
Total Bilirubin: 0.3 mg/dL (ref 0.3–1.2)
Total Protein: 7.7 g/dL (ref 6.5–8.1)

## 2018-07-31 LAB — CBC WITH DIFFERENTIAL (CANCER CENTER ONLY)
Abs Immature Granulocytes: 0.01 10*3/uL (ref 0.00–0.07)
Basophils Absolute: 0 10*3/uL (ref 0.0–0.1)
Basophils Relative: 0 %
Eosinophils Absolute: 0 10*3/uL (ref 0.0–0.5)
Eosinophils Relative: 0 %
HCT: 29.6 % — ABNORMAL LOW (ref 36.0–46.0)
Hemoglobin: 9.2 g/dL — ABNORMAL LOW (ref 12.0–15.0)
Immature Granulocytes: 0 %
Lymphocytes Relative: 13 %
Lymphs Abs: 0.3 10*3/uL — ABNORMAL LOW (ref 0.7–4.0)
MCH: 27.3 pg (ref 26.0–34.0)
MCHC: 31.1 g/dL (ref 30.0–36.0)
MCV: 87.8 fL (ref 80.0–100.0)
Monocytes Absolute: 0.4 10*3/uL (ref 0.1–1.0)
Monocytes Relative: 15 %
Neutro Abs: 1.8 10*3/uL (ref 1.7–7.7)
Neutrophils Relative %: 72 %
Platelet Count: 264 10*3/uL (ref 150–400)
RBC: 3.37 MIL/uL — ABNORMAL LOW (ref 3.87–5.11)
RDW: 20.1 % — ABNORMAL HIGH (ref 11.5–15.5)
WBC: 2.5 10*3/uL — AB (ref 4.0–10.5)
nRBC: 0 % (ref 0.0–0.2)

## 2018-07-31 MED ORDER — SODIUM CHLORIDE (PF) 0.9 % IJ SOLN
INTRAMUSCULAR | Status: AC
  Filled 2018-07-31: qty 50

## 2018-07-31 MED ORDER — IOHEXOL 300 MG/ML  SOLN
75.0000 mL | Freq: Once | INTRAMUSCULAR | Status: AC | PRN
  Administered 2018-07-31: 75 mL via INTRAVENOUS

## 2018-08-03 ENCOUNTER — Encounter: Payer: Self-pay | Admitting: Internal Medicine

## 2018-08-03 ENCOUNTER — Inpatient Hospital Stay (HOSPITAL_BASED_OUTPATIENT_CLINIC_OR_DEPARTMENT_OTHER): Payer: Medicare Other | Admitting: Internal Medicine

## 2018-08-03 ENCOUNTER — Telehealth: Payer: Self-pay | Admitting: Internal Medicine

## 2018-08-03 VITALS — BP 145/65 | HR 82 | Temp 98.4°F | Resp 17 | Ht 65.0 in | Wt 149.7 lb

## 2018-08-03 DIAGNOSIS — C342 Malignant neoplasm of middle lobe, bronchus or lung: Secondary | ICD-10-CM | POA: Diagnosis not present

## 2018-08-03 DIAGNOSIS — Z79899 Other long term (current) drug therapy: Secondary | ICD-10-CM | POA: Diagnosis not present

## 2018-08-03 DIAGNOSIS — Z5112 Encounter for antineoplastic immunotherapy: Secondary | ICD-10-CM | POA: Insufficient documentation

## 2018-08-03 DIAGNOSIS — D61818 Other pancytopenia: Secondary | ICD-10-CM | POA: Diagnosis not present

## 2018-08-03 DIAGNOSIS — D509 Iron deficiency anemia, unspecified: Secondary | ICD-10-CM

## 2018-08-03 DIAGNOSIS — R5383 Other fatigue: Secondary | ICD-10-CM | POA: Diagnosis not present

## 2018-08-03 DIAGNOSIS — R5382 Chronic fatigue, unspecified: Secondary | ICD-10-CM

## 2018-08-03 DIAGNOSIS — Z7189 Other specified counseling: Secondary | ICD-10-CM

## 2018-08-03 NOTE — Progress Notes (Signed)
DISCONTINUE ON PATHWAY REGIMEN - Non-Small Cell Lung     Administer weekly:     Paclitaxel      Carboplatin   **Always confirm dose/schedule in your pharmacy ordering system**  REASON: Continuation Of Treatment PRIOR TREATMENT: ONG295: Carboplatin AUC=2 + Paclitaxel 45 mg/m2 Weekly During Radiation TREATMENT RESPONSE: Partial Response (PR)  START ON PATHWAY REGIMEN - Non-Small Cell Lung     A cycle is every 14 days:     Durvalumab   **Always confirm dose/schedule in your pharmacy ordering system**  Patient Characteristics: Stage III - Unresectable, PS = 0, 1 AJCC T Category: T3 Current Disease Status: No Distant Mets or Local Recurrence AJCC N Category: N3 AJCC M Category: M0 AJCC 8 Stage Grouping: IIIC Performance Status: PS = 0, 1 Intent of Therapy: Curative Intent, Discussed with Patient

## 2018-08-03 NOTE — Telephone Encounter (Signed)
Printed calendar and avs. °

## 2018-08-03 NOTE — Progress Notes (Signed)
[facility] [phone]   Fax:(336) [phone]  OFFICE PROGRESS NOTE  Jenna Passy, MD [place] [address]  DIAGNOSIS: Stage IIIB (T3,N3, M0)non-small cell lung cancer, squamous cell carcinoma presented with large right middle lobe lung breast in addition to mediastinal and bilateral hilar lymphadenopathy and suspicious right supraclavicular lymph node diagnosed in August 2019.   PRIOR THERAPY: A course of concurrent chemoradiation with weekly carboplatin for AUC of 2 and paclitaxel 45 MG/M2.  First dose of chemotherapy given on 05/29/2018.  Status post 5 cycles.  CURRENT THERAPY:  Consolidation treatment with immunotherapy with Imfinzi 10 mg/KG every 2 weeks.  First dose August 10, 2018.  INTERVAL HISTORY: Jenna [name] 72 y.o. female returns to the clinic today for follow-up visit.  The patient is feeling fine today with no concerning complaints.  She denied having any recent chest pain, shortness of breath, cough or hemoptysis.  She denied having any fever or chills.  She has no nausea, vomiting, diarrhea or constipation.  She tolerated the previous course of concurrent chemoradiation fairly well except for fatigue and pancytopenia.  The patient denied having any current headache or visual changes.  She denied having any bleeding issues.  She had repeat CT scan of the chest performed recently and she is here for evaluation and discussion of her risk her results.  MEDICAL HISTORY: Past Medical History:  Diagnosis Date  . DM (diabetes mellitus) ([place])   . GERD (gastroesophageal reflux disease)   . Hypertension   . Iron deficiency anemia     ALLERGIES:  is allergic to paclitaxel; aspirin; esomeprazole magnesium; and ace inhibitors.  MEDICATIONS:  Current Outpatient Medications  Medication Sig Dispense Refill  . dexlansoprazole (DEXILANT) 60 MG capsule 1 BY MOUTH 30 MIN BEFORE BREAKFAST DAILY (Patient taking  differently: Take 60 mg by mouth See admin instructions. 1 BY MOUTH 30 MIN BEFORE BREAKFAST DAILY) 30 capsule 11  . diphenhydrAMINE (BENADRYL) 2 % cream Apply 1 application topically as needed for itching.    . docusate sodium (COLACE) 100 MG capsule Take 100 mg by mouth daily as needed for mild constipation.    . ferrous sulfate 325 (65 FE) MG tablet Take 325 mg by mouth 2 (two) times daily with a meal.     . glipiZIDE (GLUCOTROL) 10 MG tablet Take 1 tablet (10 mg total) by mouth 2 (two) times daily before a meal. 60 tablet 3  . labetalol (NORMODYNE) 100 MG tablet Take 1 tablet (100 mg total) by mouth 2 (two) times daily. 180 tablet 0  . Multiple Vitamin (MULTIVITAMIN WITH MINERALS) TABS tablet Take 1 tablet by mouth at bedtime.     . prochlorperazine (COMPAZINE) 10 MG tablet Take 1 tablet (10 mg total) by mouth every 6 (six) hours as needed for nausea or vomiting. 30 tablet 0  . simvastatin (ZOCOR) 20 MG tablet Take 20 mg by mouth every evening.    . sucralfate (CARAFATE) 1 g tablet Take 1 tablet (1 g total) by mouth 4 (four) times daily. 120 tablet 2   No current facility-administered medications for this visit.    Facility-Administered Medications Ordered in Other Visits  Medication Dose Route Frequency Provider Last Rate Last Dose  . promethazine (PHENERGAN) injection 12.5 mg  12.5 mg Intravenous Once Harle Stanford., PA-C      . Tbo-Filgrastim (GRANIX) injection 300 mcg  300 mcg Subcutaneous Once Curt Bears, MD  SURGICAL HISTORY:  Past Surgical History:  Procedure Laterality Date  . CATARACT EXTRACTION Right   . CHOLECYSTECTOMY    . COLONOSCOPY  10/24/2009   normal rectum/1X1cm abnormal lesion in the ascending colon (bx benign). TI normal for 10cm.  Prep difficult/inadequate. f/u TCS 09/2012 recommended  . COLONOSCOPY  10/13/2004   Normal rectum/Diminutive polyps, splenic flexure, cold biopsied/removed.  Remainder of colonic mucosa appeared normal.  . COLONOSCOPY N/A  12/14/2012   WUJ:[id number] polyp-tubular adenoma  . ESOPHAGOGASTRODUODENOSCOPY  10/13/2004    Normal esophagus/ Nodular volcano like lesion in the antrum, either representing a  pancreatic rest or leiomyoma, biopsied.  Remainder of the gastric mucosa appeared normal, normal D1-D2  . ESOPHAGOGASTRODUODENOSCOPY  10/24/2009   Benign biopsies. normal esophagus/small hiatal hernia/nodular lesion antrum/distal greater curvature. duodenal AVM s/p ablation  . GIVENS CAPSULE STUDY  07/27/2010    multiple arteriovenous malformations which could definitely be the contributor to her drifting hemoglobin and hematocrit  . TUBAL LIGATION    . VIDEO BRONCHOSCOPY WITH ENDOBRONCHIAL ULTRASOUND N/A 04/27/2018   Procedure: VIDEO BRONCHOSCOPY WITH ENDOBRONCHIAL ULTRASOUND;  Surgeon: Melrose Nakayama, MD;  Location: [facility];  Service: Thoracic;  Laterality: N/A;    REVIEW OF SYSTEMS:  Constitutional: positive for fatigue Eyes: negative Ears, nose, mouth, throat, and face: negative Respiratory: negative Cardiovascular: negative Gastrointestinal: negative Genitourinary:negative Integument/breast: negative Hematologic/lymphatic: negative Musculoskeletal:negative Neurological: negative Behavioral/Psych: negative Endocrine: negative Allergic/Immunologic: negative   PHYSICAL EXAMINATION: General appearance: alert, cooperative, fatigued and no distress Head: Normocephalic, without obvious abnormality, atraumatic Neck: no adenopathy, no JVD, supple, symmetrical, trachea midline and thyroid not enlarged, symmetric, no tenderness/mass/nodules Lymph nodes: Cervical, supraclavicular, and axillary nodes normal. Resp: clear to auscultation bilaterally Back: symmetric, no curvature. ROM normal. No CVA tenderness. Cardio: regular rate and rhythm, S1, S2 normal, no murmur, click, rub or gallop GI: soft, non-tender; bowel sounds normal; no masses,  no organomegaly Extremities: extremities normal, atraumatic, no cyanosis or  edema Neurologic: Alert and oriented X 3, normal strength and tone. Normal symmetric reflexes. Normal coordination and gait  ECOG PERFORMANCE STATUS: 1 - Symptomatic but completely ambulatory  Blood pressure (!) 145/65, pulse 82, temperature 98.4 F (36.9 C), temperature source Oral, resp. rate 17, height 5\' 5"  (1.651 m), weight 149 lb 11.2 oz (67.9 kg), SpO2 100 %.  LABORATORY DATA: Lab Results  Component Value Date   WBC 2.5 (L) 07/31/2018   HGB 9.2 (L) 07/31/2018   HCT 29.6 (L) 07/31/2018   MCV 87.8 07/31/2018   PLT 264 07/31/2018      Chemistry      Component Value Date/Time   NA 141 07/31/2018 1317   K 4.1 07/31/2018 1317   CL 107 07/31/2018 1317   CO2 24 07/31/2018 1317   BUN 17 07/31/2018 1317   BUN 12 12/10/2011 0919   CREATININE 0.83 07/31/2018 1317   CREATININE 0.72 12/10/2011 0919      Component Value Date/Time   CALCIUM 9.8 07/31/2018 1317   ALKPHOS 86 07/31/2018 1317   ALKPHOS 88 12/10/2011 0919   AST 17 07/31/2018 1317   ALT 11 07/31/2018 1317   BILITOT 0.3 07/31/2018 1317       RADIOGRAPHIC STUDIES: Ct Chest W Contrast  Result Date: 08/01/2018 CLINICAL DATA:  Non-small cell lung cancer. Squamous cell carcinoma.Stage IIIB (T3, N3, M0) EXAM: CT CHEST WITH CONTRAST TECHNIQUE: Multidetector CT imaging of the chest was performed during intravenous contrast administration. COMPARISON:  None. FINDINGS: Cardiovascular: Coronary artery calcification and aortic atherosclerotic calcification. Mediastinum/Nodes: No axillary adenopathy. RIGHT  supraclavicular node measures 8 mm (image 11/2). RIGHT lower paratracheal node is now low-density and measures 14 mm (image 35/2). RIGHT hilar node measures 2.2 cm. These nodes are not changed in size from PET-CT 04/28/2018 Lungs/Pleura: The RIGHT middle lobe mass is decreased in size measuring 5.0 by 2.3 cm in axial dimension compared with 6.6 x 5.5 cm on PET-CT scan. Within LEFT lung there is a 4 mm pulmonary nodule lower lobe  (image 87/2) is not seen on comparison exams. Upper Abdomen: Limited view of the liver, kidneys, pancreas are unremarkable. Normal adrenal glands. Musculoskeletal: No aggressive osseous lesion. IMPRESSION: 1. Interval decrease in size of RIGHT middle lobe pulmonary mass. 2. No significant change in size of mediastinal and RIGHT supraclavicular lymph nodes. The paratracheal node is decreased in density. 3. Resolution of RIGHT pleural effusion 4. Small pulmonary nodule in the LEFT lower lobe measuring 4 mm not seen on prior. Recommend attention on follow-up. Electronically Signed   By: Suzy Bouchard M.D.   On: 08/01/2018 09:50    ASSESSMENT AND PLAN: This is a very pleasant 72 years old African-American female recently diagnosed with a stage IIIb non-small cell lung cancer, squamous cell carcinoma.  She completed a course of concurrent chemoradiation with weekly carboplatin and paclitaxel status post 5 cycles.  She tolerated this treatment well except for the pancytopenia and fatigue. She had repeat CT scan of the chest performed recently.  I personally and independently reviewed the scan images and discussed the result and showed the images to the patient today. Her scan showed partial response to her treatment with improvement in the right middle lobe pulmonary mass. I discussed with the patient her treatment options including continuous observation versus treatment with consolidation immunotherapy with Imfinzi 10 mg/KG every 2 weeks for a total of 1 year if she has no evidence for disease progression or unacceptable toxicity.  The patient is interested in proceeding with the immunotherapy.  She is expected to start the first cycle of this treatment on August 10, 2018. I discussed with the patient the adverse effect of this treatment including but not limited to immunotherapy mediated skin rash, diarrhea, inflammation of the lung, kidney, liver, thyroid or other endocrine dysfunction including type 1  diabetes mellitus. The patient will come back for follow-up visit in 3 weeks for evaluation with the start of cycle #2. She was advised to call immediately if she has any concerning symptoms in the interval. The patient voices understanding of current disease status and treatment options and is in agreement with the current care plan. All questions were answered. The patient knows to call the clinic with any problems, questions or concerns. We can certainly see the patient much sooner if necessary.  Disclaimer: This note was dictated with voice recognition software. Similar sounding words can inadvertently be transcribed and may not be corrected upon review.

## 2018-08-08 ENCOUNTER — Ambulatory Visit: Payer: Medicare Other | Admitting: Internal Medicine

## 2018-08-08 ENCOUNTER — Ambulatory Visit: Payer: Medicare Other

## 2018-08-08 ENCOUNTER — Other Ambulatory Visit: Payer: Medicare Other

## 2018-08-10 ENCOUNTER — Inpatient Hospital Stay: Payer: Medicare Other

## 2018-08-10 VITALS — BP 155/63 | HR 81 | Temp 98.2°F | Resp 16

## 2018-08-10 DIAGNOSIS — C342 Malignant neoplasm of middle lobe, bronchus or lung: Secondary | ICD-10-CM

## 2018-08-10 DIAGNOSIS — Z79899 Other long term (current) drug therapy: Secondary | ICD-10-CM | POA: Diagnosis not present

## 2018-08-10 DIAGNOSIS — Z5112 Encounter for antineoplastic immunotherapy: Secondary | ICD-10-CM | POA: Diagnosis not present

## 2018-08-10 DIAGNOSIS — D702 Other drug-induced agranulocytosis: Secondary | ICD-10-CM

## 2018-08-10 DIAGNOSIS — C3490 Malignant neoplasm of unspecified part of unspecified bronchus or lung: Secondary | ICD-10-CM

## 2018-08-10 DIAGNOSIS — D61818 Other pancytopenia: Secondary | ICD-10-CM | POA: Diagnosis not present

## 2018-08-10 DIAGNOSIS — R5383 Other fatigue: Secondary | ICD-10-CM | POA: Diagnosis not present

## 2018-08-10 LAB — CBC WITH DIFFERENTIAL (CANCER CENTER ONLY)
Abs Immature Granulocytes: 0.03 10*3/uL (ref 0.00–0.07)
Basophils Absolute: 0 10*3/uL (ref 0.0–0.1)
Basophils Relative: 0 %
Eosinophils Absolute: 0.1 10*3/uL (ref 0.0–0.5)
Eosinophils Relative: 3 %
HCT: 28.5 % — ABNORMAL LOW (ref 36.0–46.0)
Hemoglobin: 8.7 g/dL — ABNORMAL LOW (ref 12.0–15.0)
Immature Granulocytes: 1 %
Lymphocytes Relative: 7 %
Lymphs Abs: 0.3 10*3/uL — ABNORMAL LOW (ref 0.7–4.0)
MCH: 27.6 pg (ref 26.0–34.0)
MCHC: 30.5 g/dL (ref 30.0–36.0)
MCV: 90.5 fL (ref 80.0–100.0)
Monocytes Absolute: 0.6 10*3/uL (ref 0.1–1.0)
Monocytes Relative: 13 %
NEUTROS ABS: 3.4 10*3/uL (ref 1.7–7.7)
Neutrophils Relative %: 76 %
Platelet Count: 235 10*3/uL (ref 150–400)
RBC: 3.15 MIL/uL — ABNORMAL LOW (ref 3.87–5.11)
RDW: 19.4 % — ABNORMAL HIGH (ref 11.5–15.5)
WBC Count: 4.5 10*3/uL (ref 4.0–10.5)
nRBC: 0 % (ref 0.0–0.2)

## 2018-08-10 LAB — CMP (CANCER CENTER ONLY)
ALT: 11 U/L (ref 0–44)
AST: 18 U/L (ref 15–41)
Albumin: 3.4 g/dL — ABNORMAL LOW (ref 3.5–5.0)
Alkaline Phosphatase: 104 U/L (ref 38–126)
Anion gap: 7 (ref 5–15)
BUN: 22 mg/dL (ref 8–23)
CO2: 26 mmol/L (ref 22–32)
Calcium: 9.6 mg/dL (ref 8.9–10.3)
Chloride: 107 mmol/L (ref 98–111)
Creatinine: 0.98 mg/dL (ref 0.44–1.00)
GFR, Est AFR Am: >60 mL/min (ref >60)
GFR, Estimated: 58 mL/min — ABNORMAL LOW (ref >60)
Glucose, Bld: 150 mg/dL — ABNORMAL HIGH (ref 70–99)
POTASSIUM: 4.4 mmol/L (ref 3.5–5.1)
Sodium: 140 mmol/L (ref 135–145)
Total Bilirubin: <0.2 mg/dL — ABNORMAL LOW (ref 0.3–1.2)
Total Protein: 7.9 g/dL (ref 6.5–8.1)

## 2018-08-10 MED ORDER — SODIUM CHLORIDE 0.9 % IV SOLN
Freq: Once | INTRAVENOUS | Status: AC
  Administered 2018-08-10: 16:00:00 via INTRAVENOUS
  Filled 2018-08-10: qty 250

## 2018-08-10 MED ORDER — SODIUM CHLORIDE 0.9 % IV SOLN
10.8000 mg/kg | Freq: Once | INTRAVENOUS | Status: AC
  Administered 2018-08-10: 740 mg via INTRAVENOUS
  Filled 2018-08-10: qty 4.8

## 2018-08-10 NOTE — Progress Notes (Signed)
Okay to treat pt today with Imfinzi and cmet from 07/31/18.

## 2018-08-10 NOTE — Progress Notes (Signed)
Ok to use previous CMET for treatment today per Dr. Julien Nordmann.

## 2018-08-10 NOTE — Patient Instructions (Signed)
[facility] Discharge Instructions for Patients Receiving Chemotherapy  Today you received the following chemotherapy agents imfinzi   To help prevent nausea and vomiting after your treatment, we encourage you to take your nausea medication as directed  If you develop nausea and vomiting that is not controlled by your nausea medication, call the clinic.   BELOW ARE SYMPTOMS THAT SHOULD BE REPORTED IMMEDIATELY:  *FEVER GREATER THAN 100.5 F  *CHILLS WITH OR WITHOUT FEVER  NAUSEA AND VOMITING THAT IS NOT CONTROLLED WITH YOUR NAUSEA MEDICATION  *UNUSUAL SHORTNESS OF BREATH  *UNUSUAL BRUISING OR BLEEDING  TENDERNESS IN MOUTH AND THROAT WITH OR WITHOUT PRESENCE OF ULCERS  *URINARY PROBLEMS  *BOWEL PROBLEMS  UNUSUAL RASH Items with * indicate a potential emergency and should be followed up as soon as possible.  Feel free to call the clinic you have any questions or concerns. The clinic phone number is [phone].

## 2018-08-11 ENCOUNTER — Telehealth: Payer: Self-pay | Admitting: *Deleted

## 2018-08-11 NOTE — Telephone Encounter (Signed)
TCT patient to follow up with her after 1st time Imfinzi yesterday. Spoke with patient. She states she feels well, denies any changes in how she feels. Drinking and eating well. She is aware of her upcoming appts. No further questions or concerns.

## 2018-08-11 NOTE — Telephone Encounter (Signed)
-----   Message from Arty Baumgartner, RN sent at 08/10/2018  5:02 PM EST ----- Regarding: Jenna [name], first time chemo First time imfinzi, tolerated well. Dr. Julien [name].

## 2018-08-14 ENCOUNTER — Telehealth: Payer: Self-pay | Admitting: *Deleted

## 2018-08-14 NOTE — Telephone Encounter (Signed)
Called patient to inform of fu with Shona Simpson on 08-17-18 @ 8:30 am , patient aware

## 2018-08-17 ENCOUNTER — Other Ambulatory Visit: Payer: Self-pay

## 2018-08-17 ENCOUNTER — Ambulatory Visit
Admission: RE | Admit: 2018-08-17 | Discharge: 2018-08-17 | Disposition: A | Discharge status: Home / Self Care | Payer: Medicare Other | Source: Ambulatory Visit | Attending: Radiation Oncology | Admitting: Radiation Oncology

## 2018-08-17 ENCOUNTER — Encounter: Payer: Self-pay | Admitting: Radiation Oncology

## 2018-08-17 VITALS — BP 137/53 | HR 85 | Temp 98.2°F | Resp 20 | Ht 65.0 in | Wt 151.0 lb

## 2018-08-17 DIAGNOSIS — Z7984 Long term (current) use of oral hypoglycemic drugs: Secondary | ICD-10-CM | POA: Insufficient documentation

## 2018-08-17 DIAGNOSIS — C342 Malignant neoplasm of middle lobe, bronchus or lung: Secondary | ICD-10-CM

## 2018-08-17 DIAGNOSIS — Z79899 Other long term (current) drug therapy: Secondary | ICD-10-CM | POA: Diagnosis not present

## 2018-08-17 NOTE — Progress Notes (Signed)
Radiation Oncology         [phone] ________________________________  Name: Jenna [name] MRN: [id number]  Date of Service: 08/17/2018 DOB: 30-Mar-1946  Post Treatment Note  CC: [name], MD  Curt Bears, MD  Diagnosis:   Stage IIIB (T3,N3, M0)non-small cell lung cancer, squamous cell carcinoma presented with large right middle lobe.   Interval Since Last Radiation:  5 weeks   05/24/18 - 07/10/18: The patient was treated to the disease within the right lung initially to a dose of 60 Gy using a 4 field, 3-D conformal technique. The patient then received a cone down boost treatment for an additional 6 Gy. This yielded a final total dose of 66 Gy.    Narrative:  The patient returns today for routine follow-up.  She tolerated chemoRT well and recent imaging shows improvement in her RML disease. She has persistent disease elsewhere with partial response and is going to receive consolidative immunotherapy for the next year, which she began on 08/10/18.                            On review of systems, the patient states she's doing well.  ALLERGIES:  is allergic to paclitaxel; aspirin; esomeprazole magnesium; and ace inhibitors.  Meds: Current Outpatient Medications  Medication Sig Dispense Refill  . dexlansoprazole (DEXILANT) 60 MG capsule 1 BY MOUTH 30 MIN BEFORE BREAKFAST DAILY (Patient taking differently: Take 60 mg by mouth See admin instructions. 1 BY MOUTH 30 MIN BEFORE BREAKFAST DAILY) 30 capsule 11  . diphenhydrAMINE (BENADRYL) 2 % cream Apply 1 application topically as needed for itching.    . docusate sodium (COLACE) 100 MG capsule Take 100 mg by mouth daily as needed for mild constipation.    . ferrous sulfate 325 (65 FE) MG tablet Take 325 mg by mouth 2 (two) times daily with a meal.     . glipiZIDE (GLUCOTROL) 10 MG tablet Take 1 tablet (10 mg total) by mouth 2 (two) times daily before a meal. 60 tablet 3  . labetalol (NORMODYNE) 100 MG tablet Take 1  tablet (100 mg total) by mouth 2 (two) times daily. 180 tablet 0  . Multiple Vitamin (MULTIVITAMIN WITH MINERALS) TABS tablet Take 1 tablet by mouth at bedtime.     . prochlorperazine (COMPAZINE) 10 MG tablet Take 1 tablet (10 mg total) by mouth every 6 (six) hours as needed for nausea or vomiting. 30 tablet 0  . simvastatin (ZOCOR) 20 MG tablet Take 20 mg by mouth every evening.    . sucralfate (CARAFATE) 1 g tablet Take 1 tablet (1 g total) by mouth 4 (four) times daily. 120 tablet 2   No current facility-administered medications for this encounter.    Facility-Administered Medications Ordered in Other Encounters  Medication Dose Route Frequency Provider Last Rate Last Dose  . promethazine (PHENERGAN) injection 12.5 mg  12.5 mg Intravenous Once Harle Stanford., PA-C      . Tbo-Filgrastim (GRANIX) injection 300 mcg  300 mcg Subcutaneous Once Curt Bears, MD        Physical Findings:  height is 5\' 5"  (1.651 m) and weight is 151 lb (68.5 kg). Her oral temperature is 98.2 F (36.8 C). Her blood pressure is 137/53 (abnormal) and her pulse is 85. Her respiration is 20 and oxygen saturation is 99%.   /10 In general this is a well appearing African American female in no acute distress. She's alert and oriented  x4 and appropriate throughout the examination. Cardiopulmonary assessment is negative for acute distress and she exhibits normal effort. Chest is clear to auscultation bilaterally.  Lab Findings: Lab Results  Component Value Date   WBC 4.5 08/10/2018   HGB 8.7 (L) 08/10/2018   HCT 28.5 (L) 08/10/2018   MCV 90.5 08/10/2018   PLT 235 08/10/2018     Radiographic Findings: Ct Chest W Contrast  Result Date: 08/01/2018 CLINICAL DATA:  Non-small cell lung cancer. Squamous cell carcinoma.Stage IIIB (T3, N3, M0) EXAM: CT CHEST WITH CONTRAST TECHNIQUE: Multidetector CT imaging of the chest was performed during intravenous contrast administration. COMPARISON:  None. FINDINGS:  Cardiovascular: Coronary artery calcification and aortic atherosclerotic calcification. Mediastinum/Nodes: No axillary adenopathy. RIGHT supraclavicular node measures 8 mm (image 11/2). RIGHT lower paratracheal node is now low-density and measures 14 mm (image 35/2). RIGHT hilar node measures 2.2 cm. These nodes are not changed in size from PET-CT 04/28/2018 Lungs/Pleura: The RIGHT middle lobe mass is decreased in size measuring 5.0 by 2.3 cm in axial dimension compared with 6.6 x 5.5 cm on PET-CT scan. Within LEFT lung there is a 4 mm pulmonary nodule lower lobe (image 87/2) is not seen on comparison exams. Upper Abdomen: Limited view of the liver, kidneys, pancreas are unremarkable. Normal adrenal glands. Musculoskeletal: No aggressive osseous lesion. IMPRESSION: 1. Interval decrease in size of RIGHT middle lobe pulmonary mass. 2. No significant change in size of mediastinal and RIGHT supraclavicular lymph nodes. The paratracheal node is decreased in density. 3. Resolution of RIGHT pleural effusion 4. Small pulmonary nodule in the LEFT lower lobe measuring 4 mm not seen on prior. Recommend attention on follow-up. Electronically Signed   By: Suzy Bouchard M.D.   On: 08/01/2018 09:50    Impression/Plan: 1. Stage IIIB (T3,N3, M0)non-small cell lung cancer, squamous cell carcinoma presented with large right middle lobe.  The patient is doing well since completion of her radiotherapy.  We discussed that we would be happy to see her back as needed moving forward.  She will continue her immunotherapy under the care of Dr. Julien Nordmann.  We will follow-up with her expectantly, but we did discuss considerations and to be on the look out for symptoms of pneumonitis.  She will contact us if she has any difficulties with these symptoms.     Carola Rhine, PAC

## 2018-08-21 ENCOUNTER — Ambulatory Visit: Payer: Self-pay | Admitting: Radiation Oncology

## 2018-08-21 ENCOUNTER — Other Ambulatory Visit: Payer: Self-pay | Admitting: Family Medicine

## 2018-08-21 ENCOUNTER — Other Ambulatory Visit: Payer: Self-pay

## 2018-08-21 NOTE — Patient Outreach (Signed)
[place] [facility]) Care Management  08/21/2018  Jenna [name] 06-20-1946 [id number]   Medication Adherence call to Mrs. Jenna [name] patient did not answer she is due on Olmesartan 20 mg and Glipizide 10 mg, [facility] said patient has not pick up since August/2019. Jenna [name] is showing past due under [place].   [place] Management Direct Dial [phone]  Fax [phone] [name]

## 2018-08-21 NOTE — Telephone Encounter (Signed)
Copied from [place] [id number]. Topic: Quick Communication - Rx Refill/Question >> Aug 21, 2018 12:56 PM Waylan Rocher, Lumin L wrote: Medication: glipiZIDE (GLUCOTROL) 10 MG tablet  (wants 90 day supply)  Has the patient contacted their pharmacy? Yes.   (Agent: If no, request that the patient contact the pharmacy for the refill.) (Agent: If yes, when and what did the pharmacy advise?)  Preferred Pharmacy (with phone number or street name): [place], [place] - [address] [place] [address] Phone: [phone] Fax: [phone]  Agent: Please be advised that RX refills may take up to 3 business days. We ask that you follow-up with your pharmacy.

## 2018-08-22 ENCOUNTER — Encounter (INDEPENDENT_AMBULATORY_CARE_PROVIDER_SITE_OTHER): Payer: Medicare Other | Admitting: Ophthalmology

## 2018-08-22 NOTE — Telephone Encounter (Signed)
Request for glipizide; last office visit 03/20/2018; no upcoming visits noted; last refill 03/20/18; contacted pt and she states that she is undergoing treatment for cancer, and it has been extended; she is not sure when she can schedule an appointment due to this and transportation issues; the pt can be contacted at [phone] and a message can be left at this number; will route to office for final disposition; pt last seen by Dr Deborra Medina, Audrie Lia 03/20/2018.

## 2018-08-23 MED ORDER — GLIPIZIDE 10 MG PO TABS: 10.0000 mg | ORAL_TABLET | Freq: Two times a day (BID) | ORAL | 0 refills | Status: DC

## 2018-08-25 ENCOUNTER — Encounter: Payer: Self-pay | Admitting: *Deleted

## 2018-08-25 ENCOUNTER — Inpatient Hospital Stay (HOSPITAL_BASED_OUTPATIENT_CLINIC_OR_DEPARTMENT_OTHER): Payer: Medicare Other | Admitting: Internal Medicine

## 2018-08-25 ENCOUNTER — Encounter: Payer: Self-pay | Admitting: Internal Medicine

## 2018-08-25 ENCOUNTER — Inpatient Hospital Stay: Payer: Medicare Other | Admitting: Nutrition

## 2018-08-25 ENCOUNTER — Inpatient Hospital Stay: Payer: Medicare Other

## 2018-08-25 ENCOUNTER — Telehealth: Payer: Self-pay | Admitting: Internal Medicine

## 2018-08-25 ENCOUNTER — Inpatient Hospital Stay: Payer: Medicare Other | Attending: Internal Medicine

## 2018-08-25 VITALS — BP 128/56 | HR 92 | Temp 98.8°F | Resp 17 | Ht 65.0 in | Wt 149.6 lb

## 2018-08-25 DIAGNOSIS — Z79899 Other long term (current) drug therapy: Secondary | ICD-10-CM | POA: Diagnosis not present

## 2018-08-25 DIAGNOSIS — C342 Malignant neoplasm of middle lobe, bronchus or lung: Secondary | ICD-10-CM

## 2018-08-25 DIAGNOSIS — R5382 Chronic fatigue, unspecified: Secondary | ICD-10-CM

## 2018-08-25 DIAGNOSIS — Z5112 Encounter for antineoplastic immunotherapy: Secondary | ICD-10-CM | POA: Insufficient documentation

## 2018-08-25 DIAGNOSIS — R5383 Other fatigue: Secondary | ICD-10-CM

## 2018-08-25 DIAGNOSIS — D649 Anemia, unspecified: Secondary | ICD-10-CM

## 2018-08-25 DIAGNOSIS — D509 Iron deficiency anemia, unspecified: Secondary | ICD-10-CM

## 2018-08-25 LAB — CMP (CANCER CENTER ONLY)
ALT: 11 U/L (ref 0–44)
AST: 16 U/L (ref 15–41)
Albumin: 3.2 g/dL — ABNORMAL LOW (ref 3.5–5.0)
Alkaline Phosphatase: 116 U/L (ref 38–126)
Anion gap: 10 (ref 5–15)
BILIRUBIN TOTAL: 0.3 mg/dL (ref 0.3–1.2)
BUN: 18 mg/dL (ref 8–23)
CHLORIDE: 104 mmol/L (ref 98–111)
CO2: 22 mmol/L (ref 22–32)
Calcium: 9.3 mg/dL (ref 8.9–10.3)
Creatinine: 1.09 mg/dL — ABNORMAL HIGH (ref 0.44–1.00)
GFR, Est AFR Am: 59 mL/min — ABNORMAL LOW (ref >60)
GFR, Estimated: 51 mL/min — ABNORMAL LOW (ref >60)
Glucose, Bld: 223 mg/dL — ABNORMAL HIGH (ref 70–99)
Potassium: 4.6 mmol/L (ref 3.5–5.1)
Sodium: 136 mmol/L (ref 135–145)
Total Protein: 8 g/dL (ref 6.5–8.1)

## 2018-08-25 LAB — CBC WITH DIFFERENTIAL (CANCER CENTER ONLY)
ABS IMMATURE GRANULOCYTES: 0.02 10*3/uL (ref 0.00–0.07)
Basophils Absolute: 0 10*3/uL (ref 0.0–0.1)
Basophils Relative: 0 %
Eosinophils Absolute: 0.2 10*3/uL (ref 0.0–0.5)
Eosinophils Relative: 4 %
HCT: 27.1 % — ABNORMAL LOW (ref 36.0–46.0)
Hemoglobin: 8.4 g/dL — ABNORMAL LOW (ref 12.0–15.0)
Immature Granulocytes: 0 %
LYMPHS PCT: 6 %
Lymphs Abs: 0.3 10*3/uL — ABNORMAL LOW (ref 0.7–4.0)
MCH: 28.2 pg (ref 26.0–34.0)
MCHC: 31 g/dL (ref 30.0–36.0)
MCV: 90.9 fL (ref 80.0–100.0)
Monocytes Absolute: 0.4 10*3/uL (ref 0.1–1.0)
Monocytes Relative: 7 %
Neutro Abs: 4.2 10*3/uL (ref 1.7–7.7)
Neutrophils Relative %: 83 %
Platelet Count: 217 10*3/uL (ref 150–400)
RBC: 2.98 MIL/uL — ABNORMAL LOW (ref 3.87–5.11)
RDW: 17.2 % — ABNORMAL HIGH (ref 11.5–15.5)
WBC Count: 5.1 10*3/uL (ref 4.0–10.5)
nRBC: 0 % (ref 0.0–0.2)

## 2018-08-25 LAB — TSH: TSH: 1.01 u[IU]/mL (ref 0.308–3.960)

## 2018-08-25 MED ORDER — SODIUM CHLORIDE 0.9 % IV SOLN
740.0000 mg | Freq: Once | INTRAVENOUS | Status: AC
  Administered 2018-08-25: 740 mg via INTRAVENOUS
  Filled 2018-08-25: qty 10

## 2018-08-25 MED ORDER — SODIUM CHLORIDE 0.9 % IV SOLN
Freq: Once | INTRAVENOUS | Status: AC
  Administered 2018-08-25: 10:00:00 via INTRAVENOUS
  Filled 2018-08-25: qty 250

## 2018-08-25 NOTE — Progress Notes (Signed)
Nutrition follow-up completed with patient in the infusion room.   Patient is receiving immunotherapy for non-small cell lung cancer. Weight is stable and documented as 149.6 pounds January 10 Patient denies nutrition impact symptoms. Reports she is eating well. She has no questions.  Food and nutrition related knowledge deficit resolved.  Encourage patient to contact me if she develops any nutrition impact symptoms during immunotherapy or has questions.  She has my contact information.  **Disclaimer: This note was dictated with voice recognition software. Similar sounding words can inadvertently be transcribed and this note may contain transcription errors which may not have been corrected upon publication of note.**

## 2018-08-25 NOTE — Telephone Encounter (Signed)
Printed calendar and avs. °

## 2018-08-25 NOTE — Patient Instructions (Signed)
[facility] Discharge Instructions for Patients Receiving Chemotherapy  Today you received the following chemotherapy agents imfinzi   To help prevent nausea and vomiting after your treatment, we encourage you to take your nausea medication as directed  If you develop nausea and vomiting that is not controlled by your nausea medication, call the clinic.   BELOW ARE SYMPTOMS THAT SHOULD BE REPORTED IMMEDIATELY:  *FEVER GREATER THAN 100.5 F  *CHILLS WITH OR WITHOUT FEVER  NAUSEA AND VOMITING THAT IS NOT CONTROLLED WITH YOUR NAUSEA MEDICATION  *UNUSUAL SHORTNESS OF BREATH  *UNUSUAL BRUISING OR BLEEDING  TENDERNESS IN MOUTH AND THROAT WITH OR WITHOUT PRESENCE OF ULCERS  *URINARY PROBLEMS  *BOWEL PROBLEMS  UNUSUAL RASH Items with * indicate a potential emergency and should be followed up as soon as possible.  Feel free to call the clinic you have any questions or concerns. The clinic phone number is [phone].

## 2018-08-25 NOTE — Progress Notes (Signed)
[place] [place] Telephone:([phone]   [phone]  OFFICE PROGRESS NOTE  [name], MD [place] [address]  DIAGNOSIS: Stage IIIB (T3,N3, M0)non-small cell lung cancer, squamous cell carcinoma presented with large right middle lobe lung breast in addition to mediastinal and bilateral hilar lymphadenopathy and suspicious right supraclavicular lymph node diagnosed in August 2019.   PRIOR THERAPY: A course of concurrent chemoradiation with weekly carboplatin for AUC of 2 and paclitaxel 45 MG/M2.  First dose of chemotherapy given on 05/29/2018.  Status post 5 cycles.  CURRENT THERAPY:  Consolidation treatment with immunotherapy with Imfinzi 10 mg/KG every 2 weeks.  First dose August 10, 2018.  Status post 1 cycle.  INTERVAL HISTORY: Jenna [name] 73 y.o. female returns to the clinic today for follow-up visit.  The patient is feeling fine today with no concerning complaints except for mild fatigue.  She tolerated the first cycle of her treatment with Imfinzi fairly well.  She denied having any nausea, vomiting, diarrhea or constipation.  She denied having any headache or visual changes.  She has no chest pain, shortness of breath, cough or hemoptysis.  She is here today for evaluation before starting cycle #2.  MEDICAL HISTORY: Past Medical History:  Diagnosis Date  . DM (diabetes mellitus) ([place])   . GERD (gastroesophageal reflux disease)   . Hypertension   . Iron deficiency anemia     ALLERGIES:  is allergic to paclitaxel; aspirin; esomeprazole magnesium; and ace inhibitors.  MEDICATIONS:  Current Outpatient Medications  Medication Sig Dispense Refill  . dexlansoprazole (DEXILANT) 60 MG capsule 1 BY MOUTH 30 MIN BEFORE BREAKFAST DAILY (Patient taking differently: Take 60 mg by mouth See admin instructions. 1 BY MOUTH 30 MIN BEFORE BREAKFAST DAILY) 30 capsule 11  . diphenhydrAMINE (BENADRYL) 2 % cream Apply 1 application  topically as needed for itching.    . docusate sodium (COLACE) 100 MG capsule Take 100 mg by mouth daily as needed for mild constipation.    . ferrous sulfate 325 (65 FE) MG tablet Take 325 mg by mouth 2 (two) times daily with a meal.     . glipiZIDE (GLUCOTROL) 10 MG tablet Take 1 tablet (10 mg total) by mouth 2 (two) times daily before a meal. 180 tablet 0  . labetalol (NORMODYNE) 100 MG tablet Take 1 tablet (100 mg total) by mouth 2 (two) times daily. 180 tablet 0  . Multiple Vitamin (MULTIVITAMIN WITH MINERALS) TABS tablet Take 1 tablet by mouth at bedtime.     . prochlorperazine (COMPAZINE) 10 MG tablet Take 1 tablet (10 mg total) by mouth every 6 (six) hours as needed for nausea or vomiting. 30 tablet 0  . simvastatin (ZOCOR) 20 MG tablet Take 20 mg by mouth every evening.    . sucralfate (CARAFATE) 1 g tablet Take 1 tablet (1 g total) by mouth 4 (four) times daily. 120 tablet 2   No current facility-administered medications for this visit.    Facility-Administered Medications Ordered in Other Visits  Medication Dose Route Frequency Provider Last Rate Last Dose  . promethazine (PHENERGAN) injection 12.5 mg  12.5 mg Intravenous Once Harle Stanford., PA-C      . Tbo-Filgrastim (GRANIX) injection 300 mcg  300 mcg Subcutaneous Once Curt Bears, MD        SURGICAL HISTORY:  Past Surgical History:  Procedure Laterality Date  . CATARACT EXTRACTION Right   . CHOLECYSTECTOMY    . COLONOSCOPY  10/24/2009   normal rectum/1X1cm abnormal lesion in the ascending colon (bx benign). TI normal for 10cm.  Prep difficult/inadequate. f/u TCS 09/2012 recommended  . COLONOSCOPY  10/13/2004   Normal rectum/Diminutive polyps, splenic flexure, cold biopsied/removed.  Remainder of colonic mucosa appeared normal.  . COLONOSCOPY N/A 12/14/2012   JAS:[id number] polyp-tubular adenoma  . ESOPHAGOGASTRODUODENOSCOPY  10/13/2004    Normal esophagus/ Nodular volcano like lesion in the antrum, either representing a   pancreatic rest or leiomyoma, biopsied.  Remainder of the gastric mucosa appeared normal, normal D1-D2  . ESOPHAGOGASTRODUODENOSCOPY  10/24/2009   Benign biopsies. normal esophagus/small hiatal hernia/nodular lesion antrum/distal greater curvature. duodenal AVM s/p ablation  . GIVENS CAPSULE STUDY  07/27/2010    multiple arteriovenous malformations which could definitely be the contributor to her drifting hemoglobin and hematocrit  . TUBAL LIGATION    . VIDEO BRONCHOSCOPY WITH ENDOBRONCHIAL ULTRASOUND N/A 04/27/2018   Procedure: VIDEO BRONCHOSCOPY WITH ENDOBRONCHIAL ULTRASOUND;  Surgeon: Melrose Nakayama, MD;  Location: [facility] OR;  Service: Thoracic;  Laterality: N/A;    REVIEW OF SYSTEMS:  A comprehensive review of systems was negative except for: Constitutional: positive for fatigue   PHYSICAL EXAMINATION: General appearance: alert, cooperative, fatigued and no distress Head: Normocephalic, without obvious abnormality, atraumatic Neck: no adenopathy, no JVD, supple, symmetrical, trachea midline and thyroid not enlarged, symmetric, no tenderness/mass/nodules Lymph nodes: Cervical, supraclavicular, and axillary nodes normal. Resp: clear to auscultation bilaterally Back: symmetric, no curvature. ROM normal. No CVA tenderness. Cardio: regular rate and rhythm, S1, S2 normal, no murmur, click, rub or gallop GI: soft, non-tender; bowel sounds normal; no masses,  no organomegaly Extremities: extremities normal, atraumatic, no cyanosis or edema  ECOG PERFORMANCE STATUS: 1 - Symptomatic but completely ambulatory  Blood pressure (!) 128/56, pulse 92, temperature 98.8 F (37.1 C), temperature source Oral, resp. rate 17, height 5\' 5"  (1.651 m), weight 149 lb 9.6 oz (67.9 kg), SpO2 100 %.  LABORATORY DATA: Lab Results  Component Value Date   WBC 5.1 08/25/2018   HGB 8.4 (L) 08/25/2018   HCT 27.1 (L) 08/25/2018   MCV 90.9 08/25/2018   PLT 217 08/25/2018      Chemistry      Component Value  Date/Time   NA 140 08/10/2018 1515   K 4.4 08/10/2018 1515   CL 107 08/10/2018 1515   CO2 26 08/10/2018 1515   BUN 22 08/10/2018 1515   BUN 12 12/10/2011 0919   CREATININE 0.98 08/10/2018 1515   CREATININE 0.72 12/10/2011 0919      Component Value Date/Time   CALCIUM 9.6 08/10/2018 1515   ALKPHOS 104 08/10/2018 1515   ALKPHOS 88 12/10/2011 0919   AST 18 08/10/2018 1515   ALT 11 08/10/2018 1515   BILITOT <0.2 (L) 08/10/2018 1515       RADIOGRAPHIC STUDIES: Ct Chest W Contrast  Result Date: 08/01/2018 CLINICAL DATA:  Non-small cell lung cancer. Squamous cell carcinoma.Stage IIIB (T3, N3, M0) EXAM: CT CHEST WITH CONTRAST TECHNIQUE: Multidetector CT imaging of the chest was performed during intravenous contrast administration. COMPARISON:  None. FINDINGS: Cardiovascular: Coronary artery calcification and aortic atherosclerotic calcification. Mediastinum/Nodes: No axillary adenopathy. RIGHT supraclavicular node measures 8 mm (image 11/2). RIGHT lower paratracheal node is now low-density and measures 14 mm (image 35/2). RIGHT hilar node measures 2.2 cm. These nodes are not changed in size from PET-CT 04/28/2018 Lungs/Pleura: The RIGHT middle lobe mass is decreased in size measuring 5.0 by 2.3 cm in axial dimension compared with 6.6 x 5.5 cm on PET-CT  scan. Within LEFT lung there is a 4 mm pulmonary nodule lower lobe (image 87/2) is not seen on comparison exams. Upper Abdomen: Limited view of the liver, kidneys, pancreas are unremarkable. Normal adrenal glands. Musculoskeletal: No aggressive osseous lesion. IMPRESSION: 1. Interval decrease in size of RIGHT middle lobe pulmonary mass. 2. No significant change in size of mediastinal and RIGHT supraclavicular lymph nodes. The paratracheal node is decreased in density. 3. Resolution of RIGHT pleural effusion 4. Small pulmonary nodule in the LEFT lower lobe measuring 4 mm not seen on prior. Recommend attention on follow-up. Electronically Signed   By:  Suzy Bouchard M.D.   On: 08/01/2018 09:50    ASSESSMENT AND PLAN: This is a very pleasant 73 years old African-American female recently diagnosed with a stage IIIb non-small cell lung cancer, squamous cell carcinoma.  She completed a course of concurrent chemoradiation with weekly carboplatin and paclitaxel status post 5 cycles with partial response.  She tolerated this treatment well except for the pancytopenia and fatigue. The patient is currently undergoing treatment with consolidation immunotherapy with Imfinzi status post 1 cycle. The patient rated the first cycle of her treatment well with no concerning adverse effects. I recommended for her to proceed with cycle #2 today as scheduled. For the anemia she was advised to take oral iron tablets at regular basis. I will see her back for follow-up visit in around 2 weeks for evaluation before the next cycle of her treatment. She was advised to call immediately if she has any concerning symptoms in the interval. The patient voices understanding of current disease status and treatment options and is in agreement with the current care plan. All questions were answered. The patient knows to call the clinic with any problems, questions or concerns. We can certainly see the patient much sooner if necessary.  Disclaimer: This note was dictated with voice recognition software. Similar sounding words can inadvertently be transcribed and may not be corrected upon review.

## 2018-08-25 NOTE — Progress Notes (Signed)
Oncology Nurse Navigator Documentation  Oncology Nurse Navigator Flowsheets 08/25/2018  Navigator Location [facility]  Navigator Encounter Type Clinic/MDC/I spoke with Jenna [name] today at clinic. She has completed chemo and now is on IO therapy.  I help to explained treatment plan and side effects of therapy.    Treatment Initiated Date 05/18/2018  Patient Visit Type MedOnc  Treatment Phase Treatment  Barriers/Navigation Needs Education  Education Other  Interventions Education  Education Method Verbal  Acuity Level 1  Time Spent with Patient 30

## 2018-09-06 ENCOUNTER — Inpatient Hospital Stay: Payer: Medicare Other

## 2018-09-06 ENCOUNTER — Inpatient Hospital Stay (HOSPITAL_BASED_OUTPATIENT_CLINIC_OR_DEPARTMENT_OTHER): Payer: Medicare Other | Admitting: Internal Medicine

## 2018-09-06 ENCOUNTER — Telehealth: Payer: Self-pay | Admitting: Internal Medicine

## 2018-09-06 ENCOUNTER — Encounter: Payer: Self-pay | Admitting: Internal Medicine

## 2018-09-06 VITALS — BP 148/63 | HR 85 | Temp 98.8°F | Resp 20 | Ht 65.0 in | Wt 152.2 lb

## 2018-09-06 DIAGNOSIS — C342 Malignant neoplasm of middle lobe, bronchus or lung: Secondary | ICD-10-CM

## 2018-09-06 DIAGNOSIS — Z5112 Encounter for antineoplastic immunotherapy: Secondary | ICD-10-CM

## 2018-09-06 DIAGNOSIS — D649 Anemia, unspecified: Secondary | ICD-10-CM | POA: Diagnosis not present

## 2018-09-06 DIAGNOSIS — Z79899 Other long term (current) drug therapy: Secondary | ICD-10-CM | POA: Diagnosis not present

## 2018-09-06 LAB — CMP (CANCER CENTER ONLY)
ALT: 18 U/L (ref 0–44)
ANION GAP: 10 (ref 5–15)
AST: 26 U/L (ref 15–41)
Albumin: 3.5 g/dL (ref 3.5–5.0)
Alkaline Phosphatase: 109 U/L (ref 38–126)
BUN: 20 mg/dL (ref 8–23)
CO2: 22 mmol/L (ref 22–32)
Calcium: 9.6 mg/dL (ref 8.9–10.3)
Chloride: 108 mmol/L (ref 98–111)
Creatinine: 1.1 mg/dL — ABNORMAL HIGH (ref 0.44–1.00)
GFR, Est AFR Am: 58 mL/min — ABNORMAL LOW (ref >60)
GFR, Estimated: 50 mL/min — ABNORMAL LOW (ref >60)
Glucose, Bld: 183 mg/dL — ABNORMAL HIGH (ref 70–99)
POTASSIUM: 4.6 mmol/L (ref 3.5–5.1)
Sodium: 140 mmol/L (ref 135–145)
Total Bilirubin: 0.3 mg/dL (ref 0.3–1.2)
Total Protein: 7.9 g/dL (ref 6.5–8.1)

## 2018-09-06 LAB — CBC WITH DIFFERENTIAL (CANCER CENTER ONLY)
Abs Immature Granulocytes: 0.02 10*3/uL (ref 0.00–0.07)
BASOS ABS: 0 10*3/uL (ref 0.0–0.1)
Basophils Relative: 0 %
Eosinophils Absolute: 0.2 10*3/uL (ref 0.0–0.5)
Eosinophils Relative: 4 %
HCT: 29.9 % — ABNORMAL LOW (ref 36.0–46.0)
HEMOGLOBIN: 9 g/dL — AB (ref 12.0–15.0)
Immature Granulocytes: 1 %
LYMPHS PCT: 7 %
Lymphs Abs: 0.3 10*3/uL — ABNORMAL LOW (ref 0.7–4.0)
MCH: 28 pg (ref 26.0–34.0)
MCHC: 30.1 g/dL (ref 30.0–36.0)
MCV: 93.1 fL (ref 80.0–100.0)
Monocytes Absolute: 0.3 10*3/uL (ref 0.1–1.0)
Monocytes Relative: 6 %
NEUTROS ABS: 3.4 10*3/uL (ref 1.7–7.7)
Neutrophils Relative %: 82 %
Platelet Count: 195 10*3/uL (ref 150–400)
RBC: 3.21 MIL/uL — ABNORMAL LOW (ref 3.87–5.11)
RDW: 16.5 % — ABNORMAL HIGH (ref 11.5–15.5)
WBC Count: 4.1 10*3/uL (ref 4.0–10.5)
nRBC: 0 % (ref 0.0–0.2)

## 2018-09-06 MED ORDER — SODIUM CHLORIDE 0.9 % IV SOLN
Freq: Once | INTRAVENOUS | Status: AC
  Administered 2018-09-06: 14:00:00 via INTRAVENOUS
  Filled 2018-09-06: qty 250

## 2018-09-06 MED ORDER — SODIUM CHLORIDE 0.9 % IV SOLN
10.8000 mg/kg | Freq: Once | INTRAVENOUS | Status: AC
  Administered 2018-09-06: 740 mg via INTRAVENOUS
  Filled 2018-09-06: qty 10

## 2018-09-06 NOTE — Progress Notes (Signed)
[place] [phone]   [phone]  OFFICE PROGRESS NOTE  [name], MD [place] [address]  DIAGNOSIS: Stage IIIB (T3,N3, M0)non-small cell lung cancer, squamous cell carcinoma presented with large right middle lobe lung breast in addition to mediastinal and bilateral hilar lymphadenopathy and suspicious right supraclavicular lymph node diagnosed in August 2019.   PRIOR THERAPY: A course of concurrent chemoradiation with weekly carboplatin for AUC of 2 and paclitaxel 45 MG/M2.  First dose of chemotherapy given on 05/29/2018.  Status post 5 cycles.  CURRENT THERAPY:  Consolidation treatment with immunotherapy with Imfinzi 10 mg/KG every 2 weeks.  First dose August 10, 2018.  Status post 2 cycles.  INTERVAL HISTORY: Jenna [name] 73 y.o. female returns to the clinic today for follow-up visit.  The patient is feeling fine today with no concerning complaints.  She denied having any chest pain, shortness of breath, cough or hemoptysis.  She denied having any nausea, vomiting, diarrhea or constipation.  She has no weight loss or night sweats.  She continues to tolerate her treatment with Imfinzi fairly well.  The patient is here today for evaluation before starting cycle #3.  MEDICAL HISTORY: Past Medical History:  Diagnosis Date  . DM (diabetes mellitus) ([place])   . GERD (gastroesophageal reflux disease)   . Hypertension   . Iron deficiency anemia     ALLERGIES:  is allergic to paclitaxel; aspirin; esomeprazole magnesium; and ace inhibitors.  MEDICATIONS:  Current Outpatient Medications  Medication Sig Dispense Refill  . dexlansoprazole (DEXILANT) 60 MG capsule 1 BY MOUTH 30 MIN BEFORE BREAKFAST DAILY (Patient taking differently: Take 60 mg by mouth See admin instructions. 1 BY MOUTH 30 MIN BEFORE BREAKFAST DAILY) 30 capsule 11  . diphenhydrAMINE (BENADRYL) 2 % cream Apply 1 application topically as needed  for itching.    . docusate sodium (COLACE) 100 MG capsule Take 100 mg by mouth daily as needed for mild constipation.    . ferrous sulfate 325 (65 FE) MG tablet Take 325 mg by mouth 2 (two) times daily with a meal.     . glipiZIDE (GLUCOTROL) 10 MG tablet Take 1 tablet (10 mg total) by mouth 2 (two) times daily before a meal. 180 tablet 0  . labetalol (NORMODYNE) 100 MG tablet Take 1 tablet (100 mg total) by mouth 2 (two) times daily. 180 tablet 0  . Multiple Vitamin (MULTIVITAMIN WITH MINERALS) TABS tablet Take 1 tablet by mouth at bedtime.     . prochlorperazine (COMPAZINE) 10 MG tablet Take 1 tablet (10 mg total) by mouth every 6 (six) hours as needed for nausea or vomiting. 30 tablet 0  . simvastatin (ZOCOR) 20 MG tablet Take 20 mg by mouth every evening.    . sucralfate (CARAFATE) 1 g tablet Take 1 tablet (1 g total) by mouth 4 (four) times daily. 120 tablet 2   No current facility-administered medications for this visit.    Facility-Administered Medications Ordered in Other Visits  Medication Dose Route Frequency Provider Last Rate Last Dose  . promethazine (PHENERGAN) injection 12.5 mg  12.5 mg Intravenous Once Harle Stanford., PA-C      . Tbo-Filgrastim (GRANIX) injection 300 mcg  300 mcg Subcutaneous Once Curt Bears, MD        SURGICAL HISTORY:  Past Surgical History:  Procedure Laterality Date  . CATARACT EXTRACTION Right   . CHOLECYSTECTOMY    . COLONOSCOPY  10/24/2009   normal  rectum/1X1cm abnormal lesion in the ascending colon (bx benign). TI normal for 10cm.  Prep difficult/inadequate. f/u TCS 09/2012 recommended  . COLONOSCOPY  10/13/2004   Normal rectum/Diminutive polyps, splenic flexure, cold biopsied/removed.  Remainder of colonic mucosa appeared normal.  . COLONOSCOPY N/A 12/14/2012   UKG:[id number] polyp-tubular adenoma  . ESOPHAGOGASTRODUODENOSCOPY  10/13/2004    Normal esophagus/ Nodular volcano like lesion in the antrum, either representing a  pancreatic rest or  leiomyoma, biopsied.  Remainder of the gastric mucosa appeared normal, normal D1-D2  . ESOPHAGOGASTRODUODENOSCOPY  10/24/2009   Benign biopsies. normal esophagus/small hiatal hernia/nodular lesion antrum/distal greater curvature. duodenal AVM s/p ablation  . GIVENS CAPSULE STUDY  07/27/2010    multiple arteriovenous malformations which could definitely be the contributor to her drifting hemoglobin and hematocrit  . TUBAL LIGATION    . VIDEO BRONCHOSCOPY WITH ENDOBRONCHIAL ULTRASOUND N/A 04/27/2018   Procedure: VIDEO BRONCHOSCOPY WITH ENDOBRONCHIAL ULTRASOUND;  Surgeon: Melrose Nakayama, MD;  Location: [facility] OR;  Service: Thoracic;  Laterality: N/A;    REVIEW OF SYSTEMS:  A comprehensive review of systems was negative except for: Constitutional: positive for fatigue   PHYSICAL EXAMINATION: General appearance: alert, cooperative, fatigued and no distress Head: Normocephalic, without obvious abnormality, atraumatic Neck: no adenopathy, no JVD, supple, symmetrical, trachea midline and thyroid not enlarged, symmetric, no tenderness/mass/nodules Lymph nodes: Cervical, supraclavicular, and axillary nodes normal. Resp: clear to auscultation bilaterally Back: symmetric, no curvature. ROM normal. No CVA tenderness. Cardio: regular rate and rhythm, S1, S2 normal, no murmur, click, rub or gallop GI: soft, non-tender; bowel sounds normal; no masses,  no organomegaly Extremities: extremities normal, atraumatic, no cyanosis or edema  ECOG PERFORMANCE STATUS: 1 - Symptomatic but completely ambulatory  Blood pressure (!) 148/63, pulse 85, temperature 98.8 F (37.1 C), temperature source Oral, resp. rate 20, height 5\' 5"  (1.651 m), weight 152 lb 3.2 oz (69 kg), SpO2 99 %.  LABORATORY DATA: Lab Results  Component Value Date   WBC 4.1 09/06/2018   HGB 9.0 (L) 09/06/2018   HCT 29.9 (L) 09/06/2018   MCV 93.1 09/06/2018   PLT 195 09/06/2018      Chemistry      Component Value Date/Time   NA 140  09/06/2018 1054   K 4.6 09/06/2018 1054   CL 108 09/06/2018 1054   CO2 22 09/06/2018 1054   BUN 20 09/06/2018 1054   BUN 12 12/10/2011 0919   CREATININE 1.10 (H) 09/06/2018 1054   CREATININE 0.72 12/10/2011 0919      Component Value Date/Time   CALCIUM 9.6 09/06/2018 1054   ALKPHOS 109 09/06/2018 1054   ALKPHOS 88 12/10/2011 0919   AST 26 09/06/2018 1054   ALT 18 09/06/2018 1054   BILITOT 0.3 09/06/2018 1054       RADIOGRAPHIC STUDIES: No results found.  ASSESSMENT AND PLAN: This is a very pleasant 73 years old African-American female recently diagnosed with a stage IIIB non-small cell lung cancer, squamous cell carcinoma.  She completed a course of concurrent chemoradiation with weekly carboplatin and paclitaxel status post 5 cycles with partial response.  She tolerated this treatment well except for the pancytopenia and fatigue. The patient is currently undergoing treatment with consolidation immunotherapy with Imfinzi status post 2 cycles. The patient continues to tolerate her treatment well with no concerning adverse effects. I recommended for her to proceed with cycle #3 today as scheduled. I will see her back for follow-up visit in 2 weeks for evaluation before the next cycle of her treatment. The  patient was advised to call immediately if she has any concerning symptoms in the interval. The patient voices understanding of current disease status and treatment options and is in agreement with the current care plan. All questions were answered. The patient knows to call the clinic with any problems, questions or concerns. We can certainly see the patient much sooner if necessary.  Disclaimer: This note was dictated with voice recognition software. Similar sounding words can inadvertently be transcribed and may not be corrected upon review.

## 2018-09-06 NOTE — Telephone Encounter (Signed)
3 cycles already scheduled per 1/22 los.

## 2018-09-06 NOTE — Patient Instructions (Signed)
[facility] Discharge Instructions for Patients Receiving Chemotherapy  Today you received the following chemotherapy agents Durvalumab (IMFINZI).  To help prevent nausea and vomiting after your treatment, we encourage you to take your nausea medication as prescribed.  If you develop nausea and vomiting that is not controlled by your nausea medication, call the clinic.   BELOW ARE SYMPTOMS THAT SHOULD BE REPORTED IMMEDIATELY:  *FEVER GREATER THAN 100.5 F  *CHILLS WITH OR WITHOUT FEVER  NAUSEA AND VOMITING THAT IS NOT CONTROLLED WITH YOUR NAUSEA MEDICATION  *UNUSUAL SHORTNESS OF BREATH  *UNUSUAL BRUISING OR BLEEDING  TENDERNESS IN MOUTH AND THROAT WITH OR WITHOUT PRESENCE OF ULCERS  *URINARY PROBLEMS  *BOWEL PROBLEMS  UNUSUAL RASH Items with * indicate a potential emergency and should be followed up as soon as possible.  Feel free to call the clinic should you have any questions or concerns. The clinic phone number is [phone].  Please show the [facility] at check-in to the Emergency Department and triage nurse.

## 2018-09-21 ENCOUNTER — Inpatient Hospital Stay: Payer: Medicare Other

## 2018-09-21 ENCOUNTER — Encounter: Payer: Self-pay | Admitting: Internal Medicine

## 2018-09-21 ENCOUNTER — Inpatient Hospital Stay: Payer: Medicare Other | Attending: Internal Medicine | Admitting: Internal Medicine

## 2018-09-21 VITALS — BP 152/45 | HR 83 | Temp 98.7°F | Resp 20 | Ht 65.0 in | Wt 153.8 lb

## 2018-09-21 DIAGNOSIS — R5382 Chronic fatigue, unspecified: Secondary | ICD-10-CM

## 2018-09-21 DIAGNOSIS — R5383 Other fatigue: Secondary | ICD-10-CM | POA: Diagnosis not present

## 2018-09-21 DIAGNOSIS — D649 Anemia, unspecified: Secondary | ICD-10-CM | POA: Diagnosis not present

## 2018-09-21 DIAGNOSIS — Z79899 Other long term (current) drug therapy: Secondary | ICD-10-CM | POA: Diagnosis not present

## 2018-09-21 DIAGNOSIS — C342 Malignant neoplasm of middle lobe, bronchus or lung: Secondary | ICD-10-CM | POA: Diagnosis not present

## 2018-09-21 DIAGNOSIS — Z5112 Encounter for antineoplastic immunotherapy: Secondary | ICD-10-CM | POA: Diagnosis not present

## 2018-09-21 DIAGNOSIS — D509 Iron deficiency anemia, unspecified: Secondary | ICD-10-CM

## 2018-09-21 LAB — CBC WITH DIFFERENTIAL (CANCER CENTER ONLY)
Abs Immature Granulocytes: 0.01 10*3/uL (ref 0.00–0.07)
Basophils Absolute: 0 10*3/uL (ref 0.0–0.1)
Basophils Relative: 0 %
Eosinophils Absolute: 0.1 10*3/uL (ref 0.0–0.5)
Eosinophils Relative: 3 %
HCT: 27.6 % — ABNORMAL LOW (ref 36.0–46.0)
Hemoglobin: 8.1 g/dL — ABNORMAL LOW (ref 12.0–15.0)
Immature Granulocytes: 0 %
Lymphocytes Relative: 6 %
Lymphs Abs: 0.2 10*3/uL — ABNORMAL LOW (ref 0.7–4.0)
MCH: 28.4 pg (ref 26.0–34.0)
MCHC: 29.3 g/dL — ABNORMAL LOW (ref 30.0–36.0)
MCV: 96.8 fL (ref 80.0–100.0)
Monocytes Absolute: 0.2 10*3/uL (ref 0.1–1.0)
Monocytes Relative: 7 %
NEUTROS PCT: 84 %
Neutro Abs: 2.5 10*3/uL (ref 1.7–7.7)
Platelet Count: 168 10*3/uL (ref 150–400)
RBC: 2.85 MIL/uL — ABNORMAL LOW (ref 3.87–5.11)
RDW: 15.4 % (ref 11.5–15.5)
WBC Count: 3 10*3/uL — ABNORMAL LOW (ref 4.0–10.5)
nRBC: 0 % (ref 0.0–0.2)

## 2018-09-21 LAB — CMP (CANCER CENTER ONLY)
ALK PHOS: 101 U/L (ref 38–126)
ALT: 14 U/L (ref 0–44)
AST: 19 U/L (ref 15–41)
Albumin: 3.2 g/dL — ABNORMAL LOW (ref 3.5–5.0)
Anion gap: 10 (ref 5–15)
BUN: 14 mg/dL (ref 8–23)
CO2: 21 mmol/L — ABNORMAL LOW (ref 22–32)
CREATININE: 0.9 mg/dL (ref 0.44–1.00)
Calcium: 8.9 mg/dL (ref 8.9–10.3)
Chloride: 111 mmol/L (ref 98–111)
GFR, Est AFR Am: >60 mL/min (ref >60)
GFR, Estimated: >60 mL/min (ref >60)
Glucose, Bld: 231 mg/dL — ABNORMAL HIGH (ref 70–99)
Potassium: 3.8 mmol/L (ref 3.5–5.1)
Sodium: 142 mmol/L (ref 135–145)
Total Bilirubin: 0.5 mg/dL (ref 0.3–1.2)
Total Protein: 7.2 g/dL (ref 6.5–8.1)

## 2018-09-21 LAB — TSH: TSH: <0.08 u[IU]/mL — ABNORMAL LOW (ref 0.308–3.960)

## 2018-09-21 MED ORDER — SODIUM CHLORIDE 0.9 % IV SOLN
Freq: Once | INTRAVENOUS | Status: AC
  Administered 2018-09-21: 13:00:00 via INTRAVENOUS
  Filled 2018-09-21: qty 250

## 2018-09-21 MED ORDER — SODIUM CHLORIDE 0.9 % IV SOLN
10.8000 mg/kg | Freq: Once | INTRAVENOUS | Status: AC
  Administered 2018-09-21: 740 mg via INTRAVENOUS
  Filled 2018-09-21: qty 10

## 2018-09-21 NOTE — Patient Instructions (Signed)
[facility] Discharge Instructions for Patients Receiving Chemotherapy  Today you received the following chemotherapy agents Durvalumab (IMFINZI).  To help prevent nausea and vomiting after your treatment, we encourage you to take your nausea medication as prescribed.  If you develop nausea and vomiting that is not controlled by your nausea medication, call the clinic.   BELOW ARE SYMPTOMS THAT SHOULD BE REPORTED IMMEDIATELY:  *FEVER GREATER THAN 100.5 F  *CHILLS WITH OR WITHOUT FEVER  NAUSEA AND VOMITING THAT IS NOT CONTROLLED WITH YOUR NAUSEA MEDICATION  *UNUSUAL SHORTNESS OF BREATH  *UNUSUAL BRUISING OR BLEEDING  TENDERNESS IN MOUTH AND THROAT WITH OR WITHOUT PRESENCE OF ULCERS  *URINARY PROBLEMS  *BOWEL PROBLEMS  UNUSUAL RASH Items with * indicate a potential emergency and should be followed up as soon as possible.  Feel free to call the clinic should you have any questions or concerns. The clinic phone number is [phone].  Please show the [facility] at check-in to the Emergency Department and triage nurse.

## 2018-09-21 NOTE — Progress Notes (Signed)
[facility] [phone]   [phone]  OFFICE PROGRESS NOTE  [name], MD [place] [address]  DIAGNOSIS: Stage IIIB (T3,N3, M0)non-small cell lung cancer, squamous cell carcinoma presented with large right middle lobe lung breast in addition to mediastinal and bilateral hilar lymphadenopathy and suspicious right supraclavicular lymph node diagnosed in August 2019.   PRIOR THERAPY: A course of concurrent chemoradiation with weekly carboplatin for AUC of 2 and paclitaxel 45 MG/M2.  First dose of chemotherapy given on 05/29/2018.  Status post 5 cycles.  CURRENT THERAPY:  Consolidation treatment with immunotherapy with Imfinzi 10 mg/KG every 2 weeks.  First dose August 10, 2018.  Status post 3 cycles.  INTERVAL HISTORY: Jenna [name] 73 y.o. female returns to the clinic today for follow-up visit.  The patient is feeling fine today with no concerning complaints except for mild fatigue.  She denied having any chest pain, shortness of breath, cough or hemoptysis.  She has no nausea, vomiting, diarrhea or constipation.  She denied having any headache or visual changes.  She continues to tolerate her treatment with Imfinzi fairly well.  She is here for evaluation before starting cycle #4.  MEDICAL HISTORY: Past Medical History:  Diagnosis Date  . DM (diabetes mellitus) ([facility])   . GERD (gastroesophageal reflux disease)   . Hypertension   . Iron deficiency anemia     ALLERGIES:  is allergic to paclitaxel; aspirin; esomeprazole magnesium; and ace inhibitors.  MEDICATIONS:  Current Outpatient Medications  Medication Sig Dispense Refill  . dexlansoprazole (DEXILANT) 60 MG capsule 1 BY MOUTH 30 MIN BEFORE BREAKFAST DAILY (Patient taking differently: Take 60 mg by mouth See admin instructions. 1 BY MOUTH 30 MIN BEFORE BREAKFAST DAILY) 30 capsule 11  . diphenhydrAMINE (BENADRYL) 2 % cream Apply 1 application topically  as needed for itching.    . docusate sodium (COLACE) 100 MG capsule Take 100 mg by mouth daily as needed for mild constipation.    . ferrous sulfate 325 (65 FE) MG tablet Take 325 mg by mouth 2 (two) times daily with a meal.     . glipiZIDE (GLUCOTROL) 10 MG tablet Take 1 tablet (10 mg total) by mouth 2 (two) times daily before a meal. 180 tablet 0  . labetalol (NORMODYNE) 100 MG tablet Take 1 tablet (100 mg total) by mouth 2 (two) times daily. 180 tablet 0  . Multiple Vitamin (MULTIVITAMIN WITH MINERALS) TABS tablet Take 1 tablet by mouth at bedtime.     . prochlorperazine (COMPAZINE) 10 MG tablet Take 1 tablet (10 mg total) by mouth every 6 (six) hours as needed for nausea or vomiting. 30 tablet 0  . simvastatin (ZOCOR) 20 MG tablet Take 20 mg by mouth every evening.    . sucralfate (CARAFATE) 1 g tablet Take 1 tablet (1 g total) by mouth 4 (four) times daily. 120 tablet 2   No current facility-administered medications for this visit.    Facility-Administered Medications Ordered in Other Visits  Medication Dose Route Frequency Provider Last Rate Last Dose  . promethazine (PHENERGAN) injection 12.5 mg  12.5 mg Intravenous Once Harle Stanford., PA-C      . Tbo-Filgrastim (GRANIX) injection 300 mcg  300 mcg Subcutaneous Once Curt Bears, MD        SURGICAL HISTORY:  Past Surgical History:  Procedure Laterality Date  . CATARACT EXTRACTION Right   . CHOLECYSTECTOMY    . COLONOSCOPY  10/24/2009  normal rectum/1X1cm abnormal lesion in the ascending colon (bx benign). TI normal for 10cm.  Prep difficult/inadequate. f/u TCS 09/2012 recommended  . COLONOSCOPY  10/13/2004   Normal rectum/Diminutive polyps, splenic flexure, cold biopsied/removed.  Remainder of colonic mucosa appeared normal.  . COLONOSCOPY N/A 12/14/2012   CNO:[id number] polyp-tubular adenoma  . ESOPHAGOGASTRODUODENOSCOPY  10/13/2004    Normal esophagus/ Nodular volcano like lesion in the antrum, either representing a  pancreatic  rest or leiomyoma, biopsied.  Remainder of the gastric mucosa appeared normal, normal D1-D2  . ESOPHAGOGASTRODUODENOSCOPY  10/24/2009   Benign biopsies. normal esophagus/small hiatal hernia/nodular lesion antrum/distal greater curvature. duodenal AVM s/p ablation  . GIVENS CAPSULE STUDY  07/27/2010    multiple arteriovenous malformations which could definitely be the contributor to her drifting hemoglobin and hematocrit  . TUBAL LIGATION    . VIDEO BRONCHOSCOPY WITH ENDOBRONCHIAL ULTRASOUND N/A 04/27/2018   Procedure: VIDEO BRONCHOSCOPY WITH ENDOBRONCHIAL ULTRASOUND;  Surgeon: Melrose Nakayama, MD;  Location: [facility] OR;  Service: Thoracic;  Laterality: N/A;    REVIEW OF SYSTEMS:  A comprehensive review of systems was negative except for: Constitutional: positive for fatigue   PHYSICAL EXAMINATION: General appearance: alert, cooperative, fatigued and no distress Head: Normocephalic, without obvious abnormality, atraumatic Neck: no adenopathy, no JVD, supple, symmetrical, trachea midline and thyroid not enlarged, symmetric, no tenderness/mass/nodules Lymph nodes: Cervical, supraclavicular, and axillary nodes normal. Resp: clear to auscultation bilaterally Back: symmetric, no curvature. ROM normal. No CVA tenderness. Cardio: regular rate and rhythm, S1, S2 normal, no murmur, click, rub or gallop GI: soft, non-tender; bowel sounds normal; no masses,  no organomegaly Extremities: extremities normal, atraumatic, no cyanosis or edema  ECOG PERFORMANCE STATUS: 1 - Symptomatic but completely ambulatory  Blood pressure (!) 152/45, pulse 83, temperature 98.7 F (37.1 C), temperature source Oral, resp. rate 20, height 5\' 5"  (1.651 m), weight 153 lb 12.8 oz (69.8 kg), SpO2 100 %.  LABORATORY DATA: Lab Results  Component Value Date   WBC 3.0 (L) 09/21/2018   HGB 8.1 (L) 09/21/2018   HCT 27.6 (L) 09/21/2018   MCV 96.8 09/21/2018   PLT 168 09/21/2018      Chemistry      Component Value  Date/Time   NA 140 09/06/2018 1054   K 4.6 09/06/2018 1054   CL 108 09/06/2018 1054   CO2 22 09/06/2018 1054   BUN 20 09/06/2018 1054   BUN 12 12/10/2011 0919   CREATININE 1.10 (H) 09/06/2018 1054   CREATININE 0.72 12/10/2011 0919      Component Value Date/Time   CALCIUM 9.6 09/06/2018 1054   ALKPHOS 109 09/06/2018 1054   ALKPHOS 88 12/10/2011 0919   AST 26 09/06/2018 1054   ALT 18 09/06/2018 1054   BILITOT 0.3 09/06/2018 1054       RADIOGRAPHIC STUDIES: No results found.  ASSESSMENT AND PLAN: This is a very pleasant 73 years old African-American female recently diagnosed with a stage IIIB non-small cell lung cancer, squamous cell carcinoma.  She completed a course of concurrent chemoradiation with weekly carboplatin and paclitaxel status post 5 cycles with partial response.  She tolerated this treatment well except for the pancytopenia and fatigue. The patient is currently undergoing treatment with consolidation immunotherapy with Imfinzi status post 3 cycles. The patient continues to tolerate this treatment well with no concerning adverse effects. I recommended for her to proceed with cycle #4 today as scheduled. For the persistent anemia, we will check her stool for Hemoccult and I will also check anemia panel. I  will see the patient back for follow-up visit in 2 weeks for evaluation before starting cycle #5. She was advised to call immediately if she has any concerning symptoms in the interval. The patient voices understanding of current disease status and treatment options and is in agreement with the current care plan. All questions were answered. The patient knows to call the clinic with any problems, questions or concerns. We can certainly see the patient much sooner if necessary.  Disclaimer: This note was dictated with voice recognition software. Similar sounding words can inadvertently be transcribed and may not be corrected upon review.

## 2018-10-05 ENCOUNTER — Inpatient Hospital Stay: Payer: Medicare Other

## 2018-10-05 ENCOUNTER — Other Ambulatory Visit: Payer: Self-pay

## 2018-10-05 ENCOUNTER — Inpatient Hospital Stay (HOSPITAL_BASED_OUTPATIENT_CLINIC_OR_DEPARTMENT_OTHER): Payer: Medicare Other | Admitting: Internal Medicine

## 2018-10-05 ENCOUNTER — Other Ambulatory Visit: Payer: Self-pay | Admitting: *Deleted

## 2018-10-05 ENCOUNTER — Encounter: Payer: Self-pay | Admitting: Internal Medicine

## 2018-10-05 ENCOUNTER — Encounter: Payer: Self-pay | Admitting: *Deleted

## 2018-10-05 VITALS — BP 147/60 | HR 79 | Temp 98.2°F | Resp 17 | Ht 65.0 in | Wt 152.7 lb

## 2018-10-05 DIAGNOSIS — Z5112 Encounter for antineoplastic immunotherapy: Secondary | ICD-10-CM

## 2018-10-05 DIAGNOSIS — D509 Iron deficiency anemia, unspecified: Secondary | ICD-10-CM

## 2018-10-05 DIAGNOSIS — D649 Anemia, unspecified: Secondary | ICD-10-CM | POA: Diagnosis not present

## 2018-10-05 DIAGNOSIS — C342 Malignant neoplasm of middle lobe, bronchus or lung: Secondary | ICD-10-CM

## 2018-10-05 DIAGNOSIS — R5383 Other fatigue: Secondary | ICD-10-CM | POA: Diagnosis not present

## 2018-10-05 DIAGNOSIS — Z79899 Other long term (current) drug therapy: Secondary | ICD-10-CM | POA: Diagnosis not present

## 2018-10-05 LAB — CMP (CANCER CENTER ONLY)
ALT: <6 U/L (ref 0–44)
AST: 13 U/L — ABNORMAL LOW (ref 15–41)
Albumin: 3.4 g/dL — ABNORMAL LOW (ref 3.5–5.0)
Alkaline Phosphatase: 102 U/L (ref 38–126)
Anion gap: 9 (ref 5–15)
BUN: 14 mg/dL (ref 8–23)
CALCIUM: 9.2 mg/dL (ref 8.9–10.3)
CO2: 25 mmol/L (ref 22–32)
Chloride: 105 mmol/L (ref 98–111)
Creatinine: 0.97 mg/dL (ref 0.44–1.00)
GFR, Estimated: 58 mL/min — ABNORMAL LOW (ref >60)
Glucose, Bld: 200 mg/dL — ABNORMAL HIGH (ref 70–99)
Potassium: 4.1 mmol/L (ref 3.5–5.1)
Sodium: 139 mmol/L (ref 135–145)
Total Bilirubin: 0.3 mg/dL (ref 0.3–1.2)
Total Protein: 7.9 g/dL (ref 6.5–8.1)

## 2018-10-05 LAB — CBC WITH DIFFERENTIAL (CANCER CENTER ONLY)
Abs Immature Granulocytes: 0.02 10*3/uL (ref 0.00–0.07)
Basophils Absolute: 0 10*3/uL (ref 0.0–0.1)
Basophils Relative: 0 %
Eosinophils Absolute: 0.1 10*3/uL (ref 0.0–0.5)
Eosinophils Relative: 3 %
HCT: 29.3 % — ABNORMAL LOW (ref 36.0–46.0)
Hemoglobin: 8.7 g/dL — ABNORMAL LOW (ref 12.0–15.0)
Immature Granulocytes: 1 %
Lymphocytes Relative: 7 %
Lymphs Abs: 0.2 10*3/uL — ABNORMAL LOW (ref 0.7–4.0)
MCH: 28 pg (ref 26.0–34.0)
MCHC: 29.7 g/dL — ABNORMAL LOW (ref 30.0–36.0)
MCV: 94.2 fL (ref 80.0–100.0)
Monocytes Absolute: 0.2 10*3/uL (ref 0.1–1.0)
Monocytes Relative: 5 %
Neutro Abs: 3 10*3/uL (ref 1.7–7.7)
Neutrophils Relative %: 84 %
Platelet Count: 242 10*3/uL (ref 150–400)
RBC: 3.11 MIL/uL — ABNORMAL LOW (ref 3.87–5.11)
RDW: 14.6 % (ref 11.5–15.5)
WBC Count: 3.5 10*3/uL — ABNORMAL LOW (ref 4.0–10.5)
nRBC: 0 % (ref 0.0–0.2)

## 2018-10-05 LAB — IRON AND TIBC
Iron: 56 ug/dL (ref 41–142)
Saturation Ratios: 20 % — ABNORMAL LOW (ref 21–57)
TIBC: 281 ug/dL (ref 236–444)
UIBC: 226 ug/dL (ref 120–384)

## 2018-10-05 LAB — FERRITIN: Ferritin: 28 ng/mL (ref 11–307)

## 2018-10-05 LAB — VITAMIN B12: Vitamin B-12: 270 pg/mL (ref 180–914)

## 2018-10-05 LAB — FOLATE: Folate: 16.1 ng/mL (ref >5.9)

## 2018-10-05 MED ORDER — SODIUM CHLORIDE 0.9 % IV SOLN
Freq: Once | INTRAVENOUS | Status: AC
  Administered 2018-10-05: 13:00:00 via INTRAVENOUS
  Filled 2018-10-05: qty 250

## 2018-10-05 MED ORDER — SODIUM CHLORIDE 0.9 % IV SOLN
10.8000 mg/kg | Freq: Once | INTRAVENOUS | Status: AC
  Administered 2018-10-05: 740 mg via INTRAVENOUS
  Filled 2018-10-05: qty 4.8

## 2018-10-05 NOTE — Patient Instructions (Signed)
[facility] Discharge Instructions for Patients Receiving Chemotherapy  Today you received the following chemotherapy agents: Imfinzi.  To help prevent nausea and vomiting after your treatment, we encourage you to take your nausea medication as directed.   If you develop nausea and vomiting that is not controlled by your nausea medication, call the clinic.   BELOW ARE SYMPTOMS THAT SHOULD BE REPORTED IMMEDIATELY:  *FEVER GREATER THAN 100.5 F  *CHILLS WITH OR WITHOUT FEVER  NAUSEA AND VOMITING THAT IS NOT CONTROLLED WITH YOUR NAUSEA MEDICATION  *UNUSUAL SHORTNESS OF BREATH  *UNUSUAL BRUISING OR BLEEDING  TENDERNESS IN MOUTH AND THROAT WITH OR WITHOUT PRESENCE OF ULCERS  *URINARY PROBLEMS  *BOWEL PROBLEMS  UNUSUAL RASH Items with * indicate a potential emergency and should be followed up as soon as possible.  Feel free to call the clinic should you have any questions or concerns. The clinic phone number is [phone].  Please show the CHEMO ALERT CARD at check-in to the Emergency Department and triage nurse.   

## 2018-10-05 NOTE — Progress Notes (Signed)
[facility] [phone]   [phone]  OFFICE PROGRESS NOTE  [name], MD [place] [address]  DIAGNOSIS: Stage IIIB (T3,N3, M0)non-small cell lung cancer, squamous cell carcinoma presented with large right middle lobe lung breast in addition to mediastinal and bilateral hilar lymphadenopathy and suspicious right supraclavicular lymph node diagnosed in August 2019.   PRIOR THERAPY: A course of concurrent chemoradiation with weekly carboplatin for AUC of 2 and paclitaxel 45 MG/M2.  First dose of chemotherapy given on 05/29/2018.  Status post 5 cycles.  CURRENT THERAPY:  Consolidation treatment with immunotherapy with Imfinzi 10 mg/KG every 2 weeks.  First dose August 10, 2018.  Status post 4 cycles.  INTERVAL HISTORY: Jenna [name] 73 y.o. female returns to the clinic today for follow-up visit.  The patient is feeling fine today with no concerning complaints.  She denied having any chest pain, shortness of breath except with exertion with no cough or hemoptysis.  She continues to have mild fatigue secondary to anemia and she is taking oral iron tablets.  She denied having any nausea, vomiting, diarrhea or constipation.  She denied having any headache or visual changes.  She is here today for evaluation before starting cycle #5.  MEDICAL HISTORY: Past Medical History:  Diagnosis Date  . DM (diabetes mellitus) ([place])   . GERD (gastroesophageal reflux disease)   . Hypertension   . Iron deficiency anemia     ALLERGIES:  is allergic to paclitaxel; aspirin; esomeprazole magnesium; and ace inhibitors.  MEDICATIONS:  Current Outpatient Medications  Medication Sig Dispense Refill  . dexlansoprazole (DEXILANT) 60 MG capsule 1 BY MOUTH 30 MIN BEFORE BREAKFAST DAILY (Patient taking differently: Take 60 mg by mouth See admin instructions. 1 BY MOUTH 30 MIN BEFORE BREAKFAST DAILY) 30 capsule 11  . diphenhydrAMINE  (BENADRYL) 2 % cream Apply 1 application topically as needed for itching.    . docusate sodium (COLACE) 100 MG capsule Take 100 mg by mouth daily as needed for mild constipation.    . ferrous sulfate 325 (65 FE) MG tablet Take 325 mg by mouth 2 (two) times daily with a meal.     . glipiZIDE (GLUCOTROL) 10 MG tablet Take 1 tablet (10 mg total) by mouth 2 (two) times daily before a meal. 180 tablet 0  . labetalol (NORMODYNE) 100 MG tablet Take 1 tablet (100 mg total) by mouth 2 (two) times daily. 180 tablet 0  . Multiple Vitamin (MULTIVITAMIN WITH MINERALS) TABS tablet Take 1 tablet by mouth at bedtime.     . prochlorperazine (COMPAZINE) 10 MG tablet Take 1 tablet (10 mg total) by mouth every 6 (six) hours as needed for nausea or vomiting. 30 tablet 0  . simvastatin (ZOCOR) 20 MG tablet Take 20 mg by mouth every evening.    . sucralfate (CARAFATE) 1 g tablet Take 1 tablet (1 g total) by mouth 4 (four) times daily. 120 tablet 2   No current facility-administered medications for this visit.    Facility-Administered Medications Ordered in Other Visits  Medication Dose Route Frequency Provider Last Rate Last Dose  . promethazine (PHENERGAN) injection 12.5 mg  12.5 mg Intravenous Once Harle Stanford., PA-C      . Tbo-Filgrastim (GRANIX) injection 300 mcg  300 mcg Subcutaneous Once Curt Bears, MD        SURGICAL HISTORY:  Past Surgical History:  Procedure Laterality Date  . CATARACT EXTRACTION Right   . CHOLECYSTECTOMY    .  COLONOSCOPY  10/24/2009   normal rectum/1X1cm abnormal lesion in the ascending colon (bx benign). TI normal for 10cm.  Prep difficult/inadequate. f/u TCS 09/2012 recommended  . COLONOSCOPY  10/13/2004   Normal rectum/Diminutive polyps, splenic flexure, cold biopsied/removed.  Remainder of colonic mucosa appeared normal.  . COLONOSCOPY N/A 12/14/2012   [id number] polyp-tubular adenoma  . ESOPHAGOGASTRODUODENOSCOPY  10/13/2004    Normal esophagus/ Nodular volcano like lesion  in the antrum, either representing a  pancreatic rest or leiomyoma, biopsied.  Remainder of the gastric mucosa appeared normal, normal D1-D2  . ESOPHAGOGASTRODUODENOSCOPY  10/24/2009   Benign biopsies. normal esophagus/small hiatal hernia/nodular lesion antrum/distal greater curvature. duodenal AVM s/p ablation  . GIVENS CAPSULE STUDY  07/27/2010    multiple arteriovenous malformations which could definitely be the contributor to her drifting hemoglobin and hematocrit  . TUBAL LIGATION    . VIDEO BRONCHOSCOPY WITH ENDOBRONCHIAL ULTRASOUND N/A 04/27/2018   Procedure: VIDEO BRONCHOSCOPY WITH ENDOBRONCHIAL ULTRASOUND;  Surgeon: Melrose Nakayama, MD;  Location: [facility] OR;  Service: Thoracic;  Laterality: N/A;    REVIEW OF SYSTEMS:  A comprehensive review of systems was negative except for: Constitutional: positive for fatigue   PHYSICAL EXAMINATION: General appearance: alert, cooperative, fatigued and no distress Head: Normocephalic, without obvious abnormality, atraumatic Neck: no adenopathy, no JVD, supple, symmetrical, trachea midline and thyroid not enlarged, symmetric, no tenderness/mass/nodules Lymph nodes: Cervical, supraclavicular, and axillary nodes normal. Resp: clear to auscultation bilaterally Back: symmetric, no curvature. ROM normal. No CVA tenderness. Cardio: regular rate and rhythm, S1, S2 normal, no murmur, click, rub or gallop GI: soft, non-tender; bowel sounds normal; no masses,  no organomegaly Extremities: extremities normal, atraumatic, no cyanosis or edema  ECOG PERFORMANCE STATUS: 1 - Symptomatic but completely ambulatory  Blood pressure (!) 147/60, pulse 79, temperature 98.2 F (36.8 C), temperature source Oral, resp. rate 17, height 5\' 5"  (1.651 m), weight 152 lb 11.2 oz (69.3 kg), SpO2 97 %.  LABORATORY DATA: Lab Results  Component Value Date   WBC 3.5 (L) 10/05/2018   HGB 8.7 (L) 10/05/2018   HCT 29.3 (L) 10/05/2018   MCV 94.2 10/05/2018   PLT 242 10/05/2018       Chemistry      Component Value Date/Time   NA 142 09/21/2018 1049   K 3.8 09/21/2018 1049   CL 111 09/21/2018 1049   CO2 21 (L) 09/21/2018 1049   BUN 14 09/21/2018 1049   BUN 12 12/10/2011 0919   CREATININE 0.90 09/21/2018 1049   CREATININE 0.72 12/10/2011 0919      Component Value Date/Time   CALCIUM 8.9 09/21/2018 1049   ALKPHOS 101 09/21/2018 1049   ALKPHOS 88 12/10/2011 0919   AST 19 09/21/2018 1049   ALT 14 09/21/2018 1049   BILITOT 0.5 09/21/2018 1049       RADIOGRAPHIC STUDIES: No results found.  ASSESSMENT AND PLAN: This is a very pleasant 73 years old African-American female recently diagnosed with a stage IIIB non-small cell lung cancer, squamous cell carcinoma.  She completed a course of concurrent chemoradiation with weekly carboplatin and paclitaxel status post 5 cycles with partial response.  She tolerated this treatment well except for the pancytopenia and fatigue. The patient is currently undergoing treatment with consolidation immunotherapy with Imfinzi status post 4 cycles. The patient continues to tolerate her treatment well with no concerning adverse effects. I recommended for her to proceed with cycle #5 today as scheduled. I will see her back for follow-up visit in 2 weeks for evaluation before  starting cycle #6. For the anemia, she will continue on the oral iron tablets for now. She was advised to call immediately if she has any concerning symptoms in the interval. The patient voices understanding of current disease status and treatment options and is in agreement with the current care plan. All questions were answered. The patient knows to call the clinic with any problems, questions or concerns. We can certainly see the patient much sooner if necessary.  Disclaimer: This note was dictated with voice recognition software. Similar sounding words can inadvertently be transcribed and may not be corrected upon review.

## 2018-10-05 NOTE — Research (Signed)
Human Biospecimens for the Discovery and Validation of Biomarkers for the Prediction, Diagnosis and Management of Disease  ADX01 - [id number] PI: Bobbye Riggs, MD Attending:  Visit: Consent  X Study was introduced by the attending physician.  X Patient had the opportunity to ask questions.  X Consenting personnel has reviewed consent in entirety with patient.  Discussion included, but not limited to: protocol expectations/evaluations, side effects, risks and benefits of the procedures associated with the study.  X Patient verbalizes good understanding of all instructions and information in consent, including the voluntary nature of participation, and agrees to comply with all protocol requirements.  X The patient wishes to participate in this clinical trial and willingly signed the consent document.  X Copy of signed consent was given to the patient.  The original was scanned to medial records and the original will be placed in the subject's research chart.  X No study-specific procedures were done prior to consent.  Any study specific tests/procedures will now be scheduled and completed per protocol for screening purposes.  X  The patient is aware that their insurance will not be billed as this is not a pharmaceutical trial and we are usually just collecting labs at an already scheduled lab visit.    X Patient signed the ICF (IRB approved 05/27/2017), HIPPA (IRB approved 03/04/2017) forms on 10/05/2018 at 11:40 am  X The patient was provided with the contact information for the PI and the IRB if they have any research related questions  X The PI was notified that the patient consented to the study   Patient will provide blood on 10/19/2018 to complete study enrollment.  A VISA giftcard will be provided as specified by the protocol. [facility] 10/05/18

## 2018-10-12 ENCOUNTER — Other Ambulatory Visit: Payer: Self-pay | Admitting: Internal Medicine

## 2018-10-12 ENCOUNTER — Other Ambulatory Visit: Payer: Self-pay | Admitting: *Deleted

## 2018-10-12 ENCOUNTER — Telehealth: Payer: Self-pay | Admitting: Medical Oncology

## 2018-10-12 DIAGNOSIS — D649 Anemia, unspecified: Secondary | ICD-10-CM | POA: Diagnosis not present

## 2018-10-12 DIAGNOSIS — D500 Iron deficiency anemia secondary to blood loss (chronic): Secondary | ICD-10-CM

## 2018-10-12 DIAGNOSIS — D509 Iron deficiency anemia, unspecified: Secondary | ICD-10-CM

## 2018-10-12 DIAGNOSIS — Z79899 Other long term (current) drug therapy: Secondary | ICD-10-CM | POA: Diagnosis not present

## 2018-10-12 DIAGNOSIS — Z5112 Encounter for antineoplastic immunotherapy: Secondary | ICD-10-CM | POA: Diagnosis not present

## 2018-10-12 DIAGNOSIS — C342 Malignant neoplasm of middle lobe, bronchus or lung: Secondary | ICD-10-CM | POA: Diagnosis not present

## 2018-10-12 DIAGNOSIS — R5383 Other fatigue: Secondary | ICD-10-CM | POA: Diagnosis not present

## 2018-10-12 LAB — OCCULT BLOOD X 1 CARD TO LAB, STOOL
FECAL OCCULT BLD: POSITIVE — AB
Fecal Occult Bld: POSITIVE — AB
Fecal Occult Bld: POSITIVE — AB

## 2018-10-12 NOTE — Telephone Encounter (Signed)
Pt notified about GI referral made to [facility]. She said due to transportation she needs GI referral in [place].

## 2018-10-16 ENCOUNTER — Telehealth: Payer: Self-pay

## 2018-10-16 NOTE — Telephone Encounter (Signed)
Yes okay to refill 90 day supply of each but then she does need to be seen.  Thank you!

## 2018-10-16 NOTE — Telephone Encounter (Signed)
Dr. Deborra Medina please advise, Pt states that she is almost out of labetalol 100mg  last refilled 07/11/2018 , simvastatin 20 mg pt received this medication from historical provider, and dexlansoprazole 60 mg refilled by historical provider 03/27/2013. Pt was advised that her last OV was 03/20/2018 and that she needed to make an OV. Pt states theres a transportation issue and she has a hard time getting here and she's been trying to contact someone to bring her.  Would you like me to refill these?

## 2018-10-17 ENCOUNTER — Encounter: Payer: Self-pay | Admitting: Internal Medicine

## 2018-10-17 ENCOUNTER — Other Ambulatory Visit: Payer: Self-pay

## 2018-10-17 DIAGNOSIS — I160 Hypertensive urgency: Secondary | ICD-10-CM

## 2018-10-17 DIAGNOSIS — I10 Essential (primary) hypertension: Secondary | ICD-10-CM

## 2018-10-17 MED ORDER — DEXLANSOPRAZOLE 60 MG PO CPDR: DELAYED_RELEASE_CAPSULE | ORAL | 0 refills | Status: DC

## 2018-10-17 MED ORDER — LABETALOL HCL 100 MG PO TABS: 100.0000 mg | ORAL_TABLET | Freq: Two times a day (BID) | ORAL | 0 refills | Status: DC

## 2018-10-17 MED ORDER — SIMVASTATIN 20 MG PO TABS: 20.0000 mg | ORAL_TABLET | Freq: Every evening | ORAL | 0 refills | Status: DC

## 2018-10-17 NOTE — Telephone Encounter (Signed)
Pt aware that 90 day supply of 3 meds sent to pharmacy on file and pt aware that she needs to make an OV.

## 2018-10-19 ENCOUNTER — Inpatient Hospital Stay: Payer: Medicare Other

## 2018-10-19 ENCOUNTER — Inpatient Hospital Stay: Payer: Medicare Other | Attending: Internal Medicine | Admitting: Internal Medicine

## 2018-10-19 ENCOUNTER — Encounter: Payer: Self-pay | Admitting: *Deleted

## 2018-10-19 ENCOUNTER — Encounter: Payer: Self-pay | Admitting: Internal Medicine

## 2018-10-19 ENCOUNTER — Telehealth: Payer: Self-pay | Admitting: Internal Medicine

## 2018-10-19 VITALS — BP 157/54 | HR 72 | Temp 98.2°F | Resp 20 | Ht 65.0 in | Wt 155.2 lb

## 2018-10-19 DIAGNOSIS — C3490 Malignant neoplasm of unspecified part of unspecified bronchus or lung: Secondary | ICD-10-CM

## 2018-10-19 DIAGNOSIS — Z5112 Encounter for antineoplastic immunotherapy: Secondary | ICD-10-CM | POA: Diagnosis not present

## 2018-10-19 DIAGNOSIS — C342 Malignant neoplasm of middle lobe, bronchus or lung: Secondary | ICD-10-CM

## 2018-10-19 DIAGNOSIS — R5383 Other fatigue: Secondary | ICD-10-CM | POA: Diagnosis not present

## 2018-10-19 DIAGNOSIS — Z79899 Other long term (current) drug therapy: Secondary | ICD-10-CM | POA: Diagnosis not present

## 2018-10-19 DIAGNOSIS — R5382 Chronic fatigue, unspecified: Secondary | ICD-10-CM

## 2018-10-19 DIAGNOSIS — R195 Other fecal abnormalities: Secondary | ICD-10-CM | POA: Insufficient documentation

## 2018-10-19 DIAGNOSIS — R0609 Other forms of dyspnea: Secondary | ICD-10-CM | POA: Diagnosis not present

## 2018-10-19 DIAGNOSIS — R05 Cough: Secondary | ICD-10-CM | POA: Insufficient documentation

## 2018-10-19 DIAGNOSIS — D500 Iron deficiency anemia secondary to blood loss (chronic): Secondary | ICD-10-CM

## 2018-10-19 LAB — COMPREHENSIVE METABOLIC PANEL
ALT: 16 U/L (ref 0–44)
AST: 22 U/L (ref 15–41)
Albumin: 3.4 g/dL — ABNORMAL LOW (ref 3.5–5.0)
Alkaline Phosphatase: 105 U/L (ref 38–126)
Anion gap: 7 (ref 5–15)
BUN: 18 mg/dL (ref 8–23)
CO2: 23 mmol/L (ref 22–32)
Calcium: 8.8 mg/dL — ABNORMAL LOW (ref 8.9–10.3)
Chloride: 108 mmol/L (ref 98–111)
Creatinine, Ser: 0.93 mg/dL (ref 0.44–1.00)
GFR calc Af Amer: >60 mL/min (ref >60)
GFR calc non Af Amer: >60 mL/min (ref >60)
Glucose, Bld: 213 mg/dL — ABNORMAL HIGH (ref 70–99)
Potassium: 3.6 mmol/L (ref 3.5–5.1)
Sodium: 138 mmol/L (ref 135–145)
Total Bilirubin: 0.2 mg/dL — ABNORMAL LOW (ref 0.3–1.2)
Total Protein: 7.8 g/dL (ref 6.5–8.1)

## 2018-10-19 LAB — CBC WITH DIFFERENTIAL (CANCER CENTER ONLY)
Abs Immature Granulocytes: 0.01 10*3/uL (ref 0.00–0.07)
Basophils Absolute: 0 10*3/uL (ref 0.0–0.1)
Basophils Relative: 0 %
EOS PCT: 3 %
Eosinophils Absolute: 0.1 10*3/uL (ref 0.0–0.5)
HCT: 30.1 % — ABNORMAL LOW (ref 36.0–46.0)
Hemoglobin: 9 g/dL — ABNORMAL LOW (ref 12.0–15.0)
Immature Granulocytes: 0 %
Lymphocytes Relative: 6 %
Lymphs Abs: 0.3 10*3/uL — ABNORMAL LOW (ref 0.7–4.0)
MCH: 27.9 pg (ref 26.0–34.0)
MCHC: 29.9 g/dL — ABNORMAL LOW (ref 30.0–36.0)
MCV: 93.2 fL (ref 80.0–100.0)
Monocytes Absolute: 0.2 10*3/uL (ref 0.1–1.0)
Monocytes Relative: 5 %
Neutro Abs: 3.6 10*3/uL (ref 1.7–7.7)
Neutrophils Relative %: 86 %
Platelet Count: 217 10*3/uL (ref 150–400)
RBC: 3.23 MIL/uL — ABNORMAL LOW (ref 3.87–5.11)
RDW: 14.7 % (ref 11.5–15.5)
WBC Count: 4.2 10*3/uL (ref 4.0–10.5)
nRBC: 0 % (ref 0.0–0.2)

## 2018-10-19 LAB — RESEARCH LABS

## 2018-10-19 LAB — SAMPLE TO BLOOD BANK

## 2018-10-19 LAB — TSH: TSH: 2.578 u[IU]/mL (ref 0.308–3.960)

## 2018-10-19 MED ORDER — SODIUM CHLORIDE 0.9 % IV SOLN
Freq: Once | INTRAVENOUS | Status: AC
  Administered 2018-10-19: 11:00:00 via INTRAVENOUS
  Filled 2018-10-19: qty 250

## 2018-10-19 MED ORDER — SODIUM CHLORIDE 0.9 % IV SOLN
10.8000 mg/kg | Freq: Once | INTRAVENOUS | Status: AC
  Administered 2018-10-19: 740 mg via INTRAVENOUS
  Filled 2018-10-19: qty 10

## 2018-10-19 NOTE — Patient Instructions (Signed)
[facility] Discharge Instructions for Patients Receiving Chemotherapy  Today you received the following chemotherapy agents: Imfinzi.  To help prevent nausea and vomiting after your treatment, we encourage you to take your nausea medication as directed.   If you develop nausea and vomiting that is not controlled by your nausea medication, call the clinic.   BELOW ARE SYMPTOMS THAT SHOULD BE REPORTED IMMEDIATELY:  *FEVER GREATER THAN 100.5 F  *CHILLS WITH OR WITHOUT FEVER  NAUSEA AND VOMITING THAT IS NOT CONTROLLED WITH YOUR NAUSEA MEDICATION  *UNUSUAL SHORTNESS OF BREATH  *UNUSUAL BRUISING OR BLEEDING  TENDERNESS IN MOUTH AND THROAT WITH OR WITHOUT PRESENCE OF ULCERS  *URINARY PROBLEMS  *BOWEL PROBLEMS  UNUSUAL RASH Items with * indicate a potential emergency and should be followed up as soon as possible.  Feel free to call the clinic should you have any questions or concerns. The clinic phone number is [phone].  Please show the CHEMO ALERT CARD at check-in to the Emergency Department and triage nurse.   

## 2018-10-19 NOTE — Progress Notes (Signed)
[facility] [phone]   Fax:([phone]  OFFICE PROGRESS NOTE  [name], MD [place] [address]  DIAGNOSIS: Stage IIIB (T3,N3, M0)non-small cell lung cancer, squamous cell carcinoma presented with large right middle lobe lung breast in addition to mediastinal and bilateral hilar lymphadenopathy and suspicious right supraclavicular lymph node diagnosed in August 2019.   PRIOR THERAPY: A course of concurrent chemoradiation with weekly carboplatin for AUC of 2 and paclitaxel 45 MG/M2.  First dose of chemotherapy given on 05/29/2018.  Status post 5 cycles.  CURRENT THERAPY:  Consolidation treatment with immunotherapy with Imfinzi 10 mg/KG every 2 weeks.  First dose August 10, 2018.  Status post 5 cycles.  INTERVAL HISTORY: Jenna [name] 73 y.o. female returns to the clinic today for follow-up visit.  The patient is feeling fine today with no concerning complaints except for mild fatigue.  She was tested positive for Hemoccult.  She denied having any chest pain but has shortness of breath with exertion with mild cough and no hemoptysis.  She denied having any fever or chills.  She has no nausea, vomiting, diarrhea or constipation.  She is here today for evaluation before starting cycle #6.  MEDICAL HISTORY: Past Medical History:  Diagnosis Date  . DM (diabetes mellitus) ([place])   . GERD (gastroesophageal reflux disease)   . Hypertension   . Iron deficiency anemia     ALLERGIES:  is allergic to paclitaxel; aspirin; esomeprazole magnesium; and ace inhibitors.  MEDICATIONS:  Current Outpatient Medications  Medication Sig Dispense Refill  . dexlansoprazole (DEXILANT) 60 MG capsule 1 BY MOUTH 30 MIN BEFORE BREAKFAST DAILY 90 capsule 0  . diphenhydrAMINE (BENADRYL) 2 % cream Apply 1 application topically as needed for itching.    . docusate sodium (COLACE) 100 MG capsule Take 100 mg by mouth daily as needed for mild  constipation.    . ferrous sulfate 325 (65 FE) MG tablet Take 325 mg by mouth 2 (two) times daily with a meal.     . glipiZIDE (GLUCOTROL) 10 MG tablet Take 1 tablet (10 mg total) by mouth 2 (two) times daily before a meal. 180 tablet 0  . labetalol (NORMODYNE) 100 MG tablet Take 1 tablet (100 mg total) by mouth 2 (two) times daily. 90 tablet 0  . Multiple Vitamin (MULTIVITAMIN WITH MINERALS) TABS tablet Take 1 tablet by mouth at bedtime.     . prochlorperazine (COMPAZINE) 10 MG tablet Take 1 tablet (10 mg total) by mouth every 6 (six) hours as needed for nausea or vomiting. 30 tablet 0  . simvastatin (ZOCOR) 20 MG tablet Take 1 tablet (20 mg total) by mouth every evening. 90 tablet 0  . sucralfate (CARAFATE) 1 g tablet Take 1 tablet (1 g total) by mouth 4 (four) times daily. 120 tablet 2   No current facility-administered medications for this visit.    Facility-Administered Medications Ordered in Other Visits  Medication Dose Route Frequency Provider Last Rate Last Dose  . promethazine (PHENERGAN) injection 12.5 mg  12.5 mg Intravenous Once Harle Stanford., PA-C      . Tbo-Filgrastim (GRANIX) injection 300 mcg  300 mcg Subcutaneous Once Curt Bears, MD        SURGICAL HISTORY:  Past Surgical History:  Procedure Laterality Date  . CATARACT EXTRACTION Right   . CHOLECYSTECTOMY    . COLONOSCOPY  10/24/2009   normal rectum/1X1cm abnormal lesion in the ascending colon (bx benign). TI normal  for 10cm.  Prep difficult/inadequate. f/u TCS 09/2012 recommended  . COLONOSCOPY  10/13/2004   Normal rectum/Diminutive polyps, splenic flexure, cold biopsied/removed.  Remainder of colonic mucosa appeared normal.  . COLONOSCOPY N/A 12/14/2012   [id number] polyp-tubular adenoma  . ESOPHAGOGASTRODUODENOSCOPY  10/13/2004    Normal esophagus/ Nodular volcano like lesion in the antrum, either representing a  pancreatic rest or leiomyoma, biopsied.  Remainder of the gastric mucosa appeared normal, normal  D1-D2  . ESOPHAGOGASTRODUODENOSCOPY  10/24/2009   Benign biopsies. normal esophagus/small hiatal hernia/nodular lesion antrum/distal greater curvature. duodenal AVM s/p ablation  . GIVENS CAPSULE STUDY  07/27/2010    multiple arteriovenous malformations which could definitely be the contributor to her drifting hemoglobin and hematocrit  . TUBAL LIGATION    . VIDEO BRONCHOSCOPY WITH ENDOBRONCHIAL ULTRASOUND N/A 04/27/2018   Procedure: VIDEO BRONCHOSCOPY WITH ENDOBRONCHIAL ULTRASOUND;  Surgeon: Melrose Nakayama, MD;  Location: [facility] OR;  Service: Thoracic;  Laterality: N/A;    REVIEW OF SYSTEMS:  A comprehensive review of systems was negative except for: Constitutional: positive for fatigue Respiratory: positive for dyspnea on exertion   PHYSICAL EXAMINATION: General appearance: alert, cooperative, fatigued and no distress Head: Normocephalic, without obvious abnormality, atraumatic Neck: no adenopathy, no JVD, supple, symmetrical, trachea midline and thyroid not enlarged, symmetric, no tenderness/mass/nodules Lymph nodes: Cervical, supraclavicular, and axillary nodes normal. Resp: clear to auscultation bilaterally Back: symmetric, no curvature. ROM normal. No CVA tenderness. Cardio: regular rate and rhythm, S1, S2 normal, no murmur, click, rub or gallop GI: soft, non-tender; bowel sounds normal; no masses,  no organomegaly Extremities: extremities normal, atraumatic, no cyanosis or edema  ECOG PERFORMANCE STATUS: 1 - Symptomatic but completely ambulatory  Blood pressure (!) 157/54, pulse 72, temperature 98.2 F (36.8 C), temperature source Oral, resp. rate 20, height 5\' 5"  (1.651 m), weight 155 lb 3.2 oz (70.4 kg), SpO2 100 %.  LABORATORY DATA: Lab Results  Component Value Date   WBC 4.2 10/19/2018   HGB 9.0 (L) 10/19/2018   HCT 30.1 (L) 10/19/2018   MCV 93.2 10/19/2018   PLT 217 10/19/2018      Chemistry      Component Value Date/Time   NA 139 10/05/2018 1036   K 4.1  10/05/2018 1036   CL 105 10/05/2018 1036   CO2 25 10/05/2018 1036   BUN 14 10/05/2018 1036   BUN 12 12/10/2011 0919   CREATININE 0.97 10/05/2018 1036   CREATININE 0.72 12/10/2011 0919      Component Value Date/Time   CALCIUM 9.2 10/05/2018 1036   ALKPHOS 102 10/05/2018 1036   ALKPHOS 88 12/10/2011 0919   AST 13 (L) 10/05/2018 1036   ALT <6 10/05/2018 1036   BILITOT 0.3 10/05/2018 1036       RADIOGRAPHIC STUDIES: No results found.  ASSESSMENT AND PLAN: This is a very pleasant 73 years old African-American female recently diagnosed with a stage IIIB non-small cell lung cancer, squamous cell carcinoma.  She completed a course of concurrent chemoradiation with weekly carboplatin and paclitaxel status post 5 cycles with partial response.  She tolerated this treatment well except for the pancytopenia and fatigue. The patient is currently undergoing treatment with consolidation immunotherapy with Imfinzi status post 5 cycles. The patient continues to tolerate her treatment well with no concerning complaints. I recommended for her to proceed with cycle #6 today. I will see her back for follow-up visit in 2 weeks for evaluation with repeat CT scan of the chest for restaging of her disease. For the Hemoccult positive  stool, the patient would like referral to gastroenterology in [place].  She used to live in [place] in the past and she was seen by Dr. Gala Romney but she has transportation issues and she cannot go back to him. She was advised to call immediately if she has any concerning symptoms in the interval. The patient voices understanding of current disease status and treatment options and is in agreement with the current care plan. All questions were answered. The patient knows to call the clinic with any problems, questions or concerns. We can certainly see the patient much sooner if necessary.  Disclaimer: This note was dictated with voice recognition software. Similar sounding words  can inadvertently be transcribed and may not be corrected upon review.

## 2018-10-19 NOTE — Telephone Encounter (Signed)
Per 3/5 los, appts already scheduled, sent referral thru [facility] and gave patient the number to central radiology.

## 2018-10-20 ENCOUNTER — Telehealth: Payer: Self-pay | Admitting: Medical Oncology

## 2018-10-20 ENCOUNTER — Telehealth: Payer: Self-pay | Admitting: Gastroenterology

## 2018-10-20 NOTE — Telephone Encounter (Signed)
Followup gi referral-  The office staff at [facility] will call pt. with appt.

## 2018-10-20 NOTE — Telephone Encounter (Signed)
Yes I will see this patient. Please help with booking an appointment. Thanks

## 2018-10-20 NOTE — Telephone Encounter (Signed)
DOD 3.6.20 Dr Havery Moros, pt has GI hx with Dr. Gala Romney but requested to transfer care due to transportion convenience.  She was referred from [facility] for positive hemoccult.  Records are in Epic for review.  Will you accept this patient?

## 2018-10-27 ENCOUNTER — Encounter: Payer: Self-pay | Admitting: Gastroenterology

## 2018-10-31 ENCOUNTER — Encounter (HOSPITAL_COMMUNITY): Payer: Self-pay

## 2018-10-31 ENCOUNTER — Ambulatory Visit (HOSPITAL_COMMUNITY)
Admission: RE | Admit: 2018-10-31 | Discharge: 2018-10-31 | Disposition: A | Discharge status: Home / Self Care | Payer: Medicare Other | Source: Ambulatory Visit | Attending: Internal Medicine | Admitting: Internal Medicine

## 2018-10-31 ENCOUNTER — Other Ambulatory Visit: Payer: Self-pay

## 2018-10-31 DIAGNOSIS — C3490 Malignant neoplasm of unspecified part of unspecified bronchus or lung: Secondary | ICD-10-CM | POA: Insufficient documentation

## 2018-10-31 DIAGNOSIS — Z5111 Encounter for antineoplastic chemotherapy: Secondary | ICD-10-CM | POA: Diagnosis not present

## 2018-10-31 HISTORY — DX: Malignant (primary) neoplasm, unspecified: C80.1

## 2018-10-31 MED ORDER — SODIUM CHLORIDE (PF) 0.9 % IJ SOLN
INTRAMUSCULAR | Status: AC
  Filled 2018-10-31: qty 50

## 2018-10-31 MED ORDER — IOHEXOL 300 MG/ML  SOLN
75.0000 mL | Freq: Once | INTRAMUSCULAR | Status: AC | PRN
  Administered 2018-10-31: 75 mL via INTRAVENOUS

## 2018-11-02 ENCOUNTER — Inpatient Hospital Stay: Payer: Medicare Other

## 2018-11-02 ENCOUNTER — Inpatient Hospital Stay (HOSPITAL_BASED_OUTPATIENT_CLINIC_OR_DEPARTMENT_OTHER): Payer: Medicare Other | Admitting: Internal Medicine

## 2018-11-02 ENCOUNTER — Other Ambulatory Visit: Payer: Self-pay

## 2018-11-02 ENCOUNTER — Encounter: Payer: Self-pay | Admitting: Internal Medicine

## 2018-11-02 VITALS — BP 151/58 | HR 86 | Temp 98.4°F | Resp 20 | Ht 65.0 in | Wt 159.1 lb

## 2018-11-02 DIAGNOSIS — R05 Cough: Secondary | ICD-10-CM | POA: Diagnosis not present

## 2018-11-02 DIAGNOSIS — R0609 Other forms of dyspnea: Secondary | ICD-10-CM | POA: Diagnosis not present

## 2018-11-02 DIAGNOSIS — C342 Malignant neoplasm of middle lobe, bronchus or lung: Secondary | ICD-10-CM

## 2018-11-02 DIAGNOSIS — Z5112 Encounter for antineoplastic immunotherapy: Secondary | ICD-10-CM | POA: Diagnosis not present

## 2018-11-02 DIAGNOSIS — R5383 Other fatigue: Secondary | ICD-10-CM | POA: Diagnosis not present

## 2018-11-02 DIAGNOSIS — D500 Iron deficiency anemia secondary to blood loss (chronic): Secondary | ICD-10-CM

## 2018-11-02 DIAGNOSIS — R195 Other fecal abnormalities: Secondary | ICD-10-CM | POA: Diagnosis not present

## 2018-11-02 DIAGNOSIS — Z79899 Other long term (current) drug therapy: Secondary | ICD-10-CM | POA: Diagnosis not present

## 2018-11-02 DIAGNOSIS — D649 Anemia, unspecified: Secondary | ICD-10-CM | POA: Diagnosis not present

## 2018-11-02 LAB — CMP (CANCER CENTER ONLY)
ALT: 12 U/L (ref 0–44)
AST: 13 U/L — AB (ref 15–41)
Albumin: 3.1 g/dL — ABNORMAL LOW (ref 3.5–5.0)
Alkaline Phosphatase: 112 U/L (ref 38–126)
Anion gap: 10 (ref 5–15)
BUN: 19 mg/dL (ref 8–23)
CO2: 22 mmol/L (ref 22–32)
Calcium: 8.6 mg/dL — ABNORMAL LOW (ref 8.9–10.3)
Chloride: 108 mmol/L (ref 98–111)
Creatinine: 1.29 mg/dL — ABNORMAL HIGH (ref 0.44–1.00)
GFR, Est AFR Am: 48 mL/min — ABNORMAL LOW (ref >60)
GFR, Estimated: 41 mL/min — ABNORMAL LOW (ref >60)
Glucose, Bld: 211 mg/dL — ABNORMAL HIGH (ref 70–99)
Potassium: 4.4 mmol/L (ref 3.5–5.1)
Sodium: 140 mmol/L (ref 135–145)
Total Bilirubin: 0.2 mg/dL — ABNORMAL LOW (ref 0.3–1.2)
Total Protein: 7.6 g/dL (ref 6.5–8.1)

## 2018-11-02 LAB — CBC WITH DIFFERENTIAL (CANCER CENTER ONLY)
ABS IMMATURE GRANULOCYTES: 0.02 10*3/uL (ref 0.00–0.07)
Basophils Absolute: 0 10*3/uL (ref 0.0–0.1)
Basophils Relative: 0 %
Eosinophils Absolute: 0.1 10*3/uL (ref 0.0–0.5)
Eosinophils Relative: 2 %
HCT: 28 % — ABNORMAL LOW (ref 36.0–46.0)
Hemoglobin: 8.1 g/dL — ABNORMAL LOW (ref 12.0–15.0)
Immature Granulocytes: 1 %
Lymphocytes Relative: 8 %
Lymphs Abs: 0.3 10*3/uL — ABNORMAL LOW (ref 0.7–4.0)
MCH: 27.8 pg (ref 26.0–34.0)
MCHC: 28.9 g/dL — ABNORMAL LOW (ref 30.0–36.0)
MCV: 96.2 fL (ref 80.0–100.0)
MONO ABS: 0.2 10*3/uL (ref 0.1–1.0)
Monocytes Relative: 5 %
NEUTROS ABS: 3.1 10*3/uL (ref 1.7–7.7)
Neutrophils Relative %: 84 %
Platelet Count: 221 10*3/uL (ref 150–400)
RBC: 2.91 MIL/uL — ABNORMAL LOW (ref 3.87–5.11)
RDW: 15.5 % (ref 11.5–15.5)
WBC Count: 3.7 10*3/uL — ABNORMAL LOW (ref 4.0–10.5)
nRBC: 0 % (ref 0.0–0.2)

## 2018-11-02 MED ORDER — SODIUM CHLORIDE 0.9 % IV SOLN
Freq: Once | INTRAVENOUS | Status: AC
  Administered 2018-11-02: 10:00:00 via INTRAVENOUS
  Filled 2018-11-02: qty 250

## 2018-11-02 MED ORDER — SODIUM CHLORIDE 0.9 % IV SOLN
10.8000 mg/kg | Freq: Once | INTRAVENOUS | Status: AC
  Administered 2018-11-02: 740 mg via INTRAVENOUS
  Filled 2018-11-02: qty 10

## 2018-11-02 NOTE — Progress Notes (Signed)
[facility] [phone]   [phone]  OFFICE PROGRESS NOTE  Jenna Passy, MD [place] [address]  DIAGNOSIS: Stage IIIB (T3,N3, M0)non-small cell lung cancer, squamous cell carcinoma presented with large right middle lobe lung breast in addition to mediastinal and bilateral hilar lymphadenopathy and suspicious right supraclavicular lymph node diagnosed in August 2019.   PRIOR THERAPY: A course of concurrent chemoradiation with weekly carboplatin for AUC of 2 and paclitaxel 45 MG/M2.  First dose of chemotherapy given on 05/29/2018.  Status post 5 cycles.  CURRENT THERAPY:  Consolidation treatment with immunotherapy with Imfinzi 10 mg/KG every 2 weeks.  First dose August 10, 2018.  Status post 6 cycles.  INTERVAL HISTORY: Jenna [name] 73 y.o. female returns to the clinic today for follow-up visit.  The patient is feeling fine today with no concerning complaints.  She denied having any chest pain, shortness of breath, cough or hemoptysis.  She continues to have fatigue.  She is scheduled to see gastroenterology next week for evaluation of her persistent anemia and Hemoccult-positive stool.  The patient denied having any nausea, vomiting, diarrhea or constipation.  She denied having any weight loss or night sweats.  She has been tolerating this treatment fairly well with no concerning adverse effects.  She had repeat CT scan of the chest performed recently and she is here for evaluation and discussion of her scan results.  MEDICAL HISTORY: Past Medical History:  Diagnosis Date  . DM (diabetes mellitus) ([facility])   . GERD (gastroesophageal reflux disease)   . Hypertension   . Iron deficiency anemia   . NSCL ca dx'd 03/2018    ALLERGIES:  is allergic to paclitaxel; aspirin; esomeprazole magnesium; and ace inhibitors.  MEDICATIONS:  Current Outpatient Medications  Medication Sig Dispense Refill  . dexlansoprazole  (DEXILANT) 60 MG capsule 1 BY MOUTH 30 MIN BEFORE BREAKFAST DAILY 90 capsule 0  . diphenhydrAMINE (BENADRYL) 2 % cream Apply 1 application topically as needed for itching.    . docusate sodium (COLACE) 100 MG capsule Take 100 mg by mouth daily as needed for mild constipation.    . ferrous sulfate 325 (65 FE) MG tablet Take 325 mg by mouth 2 (two) times daily with a meal.     . glipiZIDE (GLUCOTROL) 10 MG tablet Take 1 tablet (10 mg total) by mouth 2 (two) times daily before a meal. 180 tablet 0  . labetalol (NORMODYNE) 100 MG tablet Take 1 tablet (100 mg total) by mouth 2 (two) times daily. 90 tablet 0  . Multiple Vitamin (MULTIVITAMIN WITH MINERALS) TABS tablet Take 1 tablet by mouth at bedtime.     . prochlorperazine (COMPAZINE) 10 MG tablet Take 1 tablet (10 mg total) by mouth every 6 (six) hours as needed for nausea or vomiting. 30 tablet 0  . simvastatin (ZOCOR) 20 MG tablet Take 1 tablet (20 mg total) by mouth every evening. 90 tablet 0  . sucralfate (CARAFATE) 1 g tablet Take 1 tablet (1 g total) by mouth 4 (four) times daily. 120 tablet 2   No current facility-administered medications for this visit.    Facility-Administered Medications Ordered in Other Visits  Medication Dose Route Frequency Provider Last Rate Last Dose  . promethazine (PHENERGAN) injection 12.5 mg  12.5 mg Intravenous Once Harle Stanford., PA-C      . Tbo-Filgrastim (GRANIX) injection 300 mcg  300 mcg Subcutaneous Once Curt Bears, MD  SURGICAL HISTORY:  Past Surgical History:  Procedure Laterality Date  . CATARACT EXTRACTION Right   . CHOLECYSTECTOMY    . COLONOSCOPY  10/24/2009   normal rectum/1X1cm abnormal lesion in the ascending colon (bx benign). TI normal for 10cm.  Prep difficult/inadequate. f/u TCS 09/2012 recommended  . COLONOSCOPY  10/13/2004   Normal rectum/Diminutive polyps, splenic flexure, cold biopsied/removed.  Remainder of colonic mucosa appeared normal.  . COLONOSCOPY N/A 12/14/2012    ENI:DPOEUMP polyp-tubular adenoma  . ESOPHAGOGASTRODUODENOSCOPY  10/13/2004    Normal esophagus/ Nodular volcano like lesion in the antrum, either representing a  pancreatic rest or leiomyoma, biopsied.  Remainder of the gastric mucosa appeared normal, normal D1-D2  . ESOPHAGOGASTRODUODENOSCOPY  10/24/2009   Benign biopsies. normal esophagus/small hiatal hernia/nodular lesion antrum/distal greater curvature. duodenal AVM s/p ablation  . GIVENS CAPSULE STUDY  07/27/2010    multiple arteriovenous malformations which could definitely be the contributor to her drifting hemoglobin and hematocrit  . TUBAL LIGATION    . VIDEO BRONCHOSCOPY WITH ENDOBRONCHIAL ULTRASOUND N/A 04/27/2018   Procedure: VIDEO BRONCHOSCOPY WITH ENDOBRONCHIAL ULTRASOUND;  Surgeon: Melrose Nakayama, MD;  Location: [facility];  Service: Thoracic;  Laterality: N/A;    REVIEW OF SYSTEMS:  Constitutional: positive for fatigue Eyes: negative Ears, nose, mouth, throat, and face: negative Respiratory: negative Cardiovascular: negative Gastrointestinal: negative Genitourinary:negative Integument/breast: negative Hematologic/lymphatic: negative Musculoskeletal:negative Neurological: negative Behavioral/Psych: negative Endocrine: negative Allergic/Immunologic: negative   PHYSICAL EXAMINATION: General appearance: alert, cooperative, fatigued and no distress Head: Normocephalic, without obvious abnormality, atraumatic Neck: no adenopathy, no JVD, supple, symmetrical, trachea midline and thyroid not enlarged, symmetric, no tenderness/mass/nodules Lymph nodes: Cervical, supraclavicular, and axillary nodes normal. Resp: clear to auscultation bilaterally Back: symmetric, no curvature. ROM normal. No CVA tenderness. Cardio: regular rate and rhythm, S1, S2 normal, no murmur, click, rub or gallop GI: soft, non-tender; bowel sounds normal; no masses,  no organomegaly Extremities: extremities normal, atraumatic, no cyanosis or  edema Neurologic: Alert and oriented X 3, normal strength and tone. Normal symmetric reflexes. Normal coordination and gait  ECOG PERFORMANCE STATUS: 1 - Symptomatic but completely ambulatory  Blood pressure (!) 151/58, pulse 86, temperature 98.4 F (36.9 C), temperature source Oral, resp. rate 20, height 5\' 5"  (1.651 m), weight 159 lb 1.6 oz (72.2 kg), SpO2 98 %.  LABORATORY DATA: Lab Results  Component Value Date   WBC 3.7 (L) 11/02/2018   HGB 8.1 (L) 11/02/2018   HCT 28.0 (L) 11/02/2018   MCV 96.2 11/02/2018   PLT 221 11/02/2018      Chemistry      Component Value Date/Time   NA 140 11/02/2018 0851   K 4.4 11/02/2018 0851   CL 108 11/02/2018 0851   CO2 22 11/02/2018 0851   BUN 19 11/02/2018 0851   BUN 12 12/10/2011 0919   CREATININE 1.29 (H) 11/02/2018 0851   CREATININE 0.72 12/10/2011 0919      Component Value Date/Time   CALCIUM 8.6 (L) 11/02/2018 0851   ALKPHOS 112 11/02/2018 0851   ALKPHOS 88 12/10/2011 0919   AST 13 (L) 11/02/2018 0851   ALT 12 11/02/2018 0851   BILITOT 0.2 (L) 11/02/2018 0851       RADIOGRAPHIC STUDIES: Ct Chest W Contrast  Result Date: 10/31/2018 CLINICAL DATA:  Lung cancer.  Ongoing chemotherapy. EXAM: CT CHEST WITH CONTRAST TECHNIQUE: Multidetector CT imaging of the chest was performed during intravenous contrast administration. CONTRAST:  4mL OMNIPAQUE IOHEXOL 300 MG/ML  SOLN COMPARISON:  07/31/2018 FINDINGS: Cardiovascular: The heart size is normal.  No substantial pericardial effusion. Coronary artery calcification is evident. Atherosclerotic calcification is noted in the wall of the thoracic aorta. Mediastinum/Nodes: 12 mm short axis paraesophageal node (29/2) is slightly increased from 9 mm previously. 8 mm short axis node towards the right thoracic inlet (23/2) was 5 mm previously. Index node measured in the right supraclavicular region on the previous study at 8 mm now measures 7 mm. Right hilar lymphadenopathy measured in long axis on  the previous study at 2.2 cm is stable at 2.2 cm today. The esophagus has normal imaging features. There is no axillary lymphadenopathy. Lungs/Pleura: The central tracheobronchial airways are patent. Centrilobular emphsyema noted. Right middle lobe collapse seen on the previous study has decreased in the interval. There is diffuse interstitial and patchy airspace disease in the medial right upper lobe and parahilar right middle and right lower lobes, markedly progressed in the interval. Patchy ground-glass attenuation in the central left lower lobe is similar to prior. Several scattered ill-defined tiny nodular opacities in the left lower lobe are again noted. The index 4 mm nodule identified on the previous study is stable. Small to moderate right pleural effusion is new since prior. Upper Abdomen: Unremarkable. Musculoskeletal: No worrisome lytic or sclerotic osseous abnormality. IMPRESSION: 1. Right middle lobe pulmonary mass measured previously is not measurable today given the extensive adjacent airspace disease in the right upper and lower lobes. Right middle lobe collapse seen on the previous study has decreased in the interval. Worsening interstitial and airspace disease in the parahilar right lung today may be sequelae of radiation therapy, but inflammation/infection not excluded. 2. No substantial change in the right supraclavicular and right mediastinal/paratracheal lymphadenopathy with some lymph nodes measuring minimally bigger on today's study and others measuring minimally smaller. Right hilar disease also stable. 3. Interval redevelopment of small to moderate right pleural effusion. 4.  Aortic Atherosclerois (ICD10-170.0) Electronically Signed   By: Misty Stanley M.D.   On: 10/31/2018 11:24    ASSESSMENT AND PLAN: This is a very pleasant 73 years old African-American female recently diagnosed with a stage IIIB non-small cell lung cancer, squamous cell carcinoma.  She completed a course of  concurrent chemoradiation with weekly carboplatin and paclitaxel status post 5 cycles with partial response.  She tolerated this treatment well except for the pancytopenia and fatigue. The patient is currently undergoing treatment with consolidation immunotherapy with Imfinzi status post 6 cycles. The patient continues to tolerate this treatment well with no concerning adverse effects. She had repeat CT scan of the chest performed recently.  I personally and independently reviewed the scans and discussed the results with the patient today. Her scan showed no concerning findings for disease progression. I recommended for the patient to continue her current treatment with Imfinzi and she will proceed with cycle #7 today. For the anemia, the patient will see gastroenterology early next week for evaluation and to rule out gastrointestinal source for her blood loss. The patient will come back for follow-up visit in 2 weeks for evaluation before starting cycle #8. She was advised to call immediately if she has any concerning symptoms in the interval. The patient voices understanding of current disease status and treatment options and is in agreement with the current care plan. All questions were answered. The patient knows to call the clinic with any problems, questions or concerns. We can certainly see the patient much sooner if necessary.  Disclaimer: This note was dictated with voice recognition software. Similar sounding words can inadvertently be transcribed and may  not be corrected upon review.

## 2018-11-02 NOTE — Patient Instructions (Signed)
[facility] Discharge Instructions for Patients Receiving Chemotherapy  Today you received the following chemotherapy agents: Imfinzi.  To help prevent nausea and vomiting after your treatment, we encourage you to take your nausea medication as directed.   If you develop nausea and vomiting that is not controlled by your nausea medication, call the clinic.   BELOW ARE SYMPTOMS THAT SHOULD BE REPORTED IMMEDIATELY:  *FEVER GREATER THAN 100.5 F  *CHILLS WITH OR WITHOUT FEVER  NAUSEA AND VOMITING THAT IS NOT CONTROLLED WITH YOUR NAUSEA MEDICATION  *UNUSUAL SHORTNESS OF BREATH  *UNUSUAL BRUISING OR BLEEDING  TENDERNESS IN MOUTH AND THROAT WITH OR WITHOUT PRESENCE OF ULCERS  *URINARY PROBLEMS  *BOWEL PROBLEMS  UNUSUAL RASH Items with * indicate a potential emergency and should be followed up as soon as possible.  Feel free to call the clinic should you have any questions or concerns. The clinic phone number is [phone].  Please show the CHEMO ALERT CARD at check-in to the Emergency Department and triage nurse.   

## 2018-11-06 ENCOUNTER — Telehealth (INDEPENDENT_AMBULATORY_CARE_PROVIDER_SITE_OTHER): Payer: Medicare Other | Admitting: Gastroenterology

## 2018-11-06 ENCOUNTER — Other Ambulatory Visit: Payer: Self-pay

## 2018-11-06 ENCOUNTER — Telehealth: Payer: Self-pay | Admitting: Internal Medicine

## 2018-11-06 DIAGNOSIS — Z860100 Personal history of colon polyps, unspecified: Secondary | ICD-10-CM

## 2018-11-06 DIAGNOSIS — R195 Other fecal abnormalities: Secondary | ICD-10-CM

## 2018-11-06 DIAGNOSIS — K5900 Constipation, unspecified: Secondary | ICD-10-CM

## 2018-11-06 DIAGNOSIS — D500 Iron deficiency anemia secondary to blood loss (chronic): Secondary | ICD-10-CM

## 2018-11-06 NOTE — Progress Notes (Signed)
Virtual Visit via Telephone Note  I connected with Jenna [name] on 11/06/18 at  3:30 PM EDT by telephone and verified that I am speaking with the correct person using two identifiers.   I discussed the limitations, risks, security and privacy concerns of performing an evaluation and management service by telephone and the availability of in person appointments. I also discussed with the patient that there may be a patient responsible charge related to this service. The patient expressed understanding and agreed to proceed.  THIS ENCOUNTER IS A VIRTUAL VISIT DUE TO COVID-19 - PATIENT WAS NOT SEEN IN THE OFFICE. PATIENT HAS CONSENTED TO VIRTUAL VISIT / TELEMEDICINE VISIT   Location of patient: home Location of provider: office Name of referring provider: Curt Bears MD Time spent on call:  15 minutes   HPI :  73 y/o female with Stage IIIB (T3,N3, M0)non-small cell lung cancer, s/p chemo / radiation with carboplatin and paxitaxel - did not respond to chemotherapy, immunotherapy with Imfinzi, referred here by Dr. Earlie Server for persistent anemia and heme positive stools.  She's had a history of long-standing iron deficiency anemia for several years. She was evaluated for this in 2011 with an EGD and capsule endoscopy. 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 Her last colonoscopy was in 2014 at which point time she had a 4 mm adenoma. Her hemoglobin has historically remained low around 8 to nines, more recently and November she required a blood transfusion for hemoglobin of 7.1. Her iron studies while on oral iron showed a ferritin of 28, and iron sat of 20%. She recently had 3 out of 3 stools positive for occult blood. Her stools chronically dark due to iron but states no new changes other than ongoing constipation. She uses a stool softener but it doesn't help too much. She denies any abdominal pains. No nausea or vomiting. She is otherwise eating well. She is a  long-standing history of reflux and uses Dexilant 60 mg once a day which works quite well for her symptoms. She denies any NSAID use. No blood thinners. Previously given Carafate during radiation therapy for her lung cancer which she no longer takes. Should a recent staging CT as outlined below. She denies any breathing trouble, she is not on any oxygen. She denies any chest pain. No fevers. She states she feels well although is concerned significantly about the COVID-19 outbreak.   Labs 11/02/18 - Hgb 8.1,, MCV 96.2, plt 221, WBC 3.7 Occult blood stools (+) x 3 Ferritin 28, iron 56, sat 20%, TIBC 281 Folate 16.1, B12 270  Hgb in November dropped to 7.1 leading to transfusion  Colonoscopy 12/14/2012 - Dr. 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 Right middle lobe collapse seen on the previous study has decreased in the interval. Worsening interstitial and airspace disease in the parahilar right lung today may be sequelae of radiation therapy, but inflammation/infection not excluded. 2. No substantial change in the right supraclavicular and right mediastinal/paratracheal lymphadenopathy with some lymph nodes measuring minimally bigger on today's study and others measuring minimally smaller. Right hilar disease also stable. 3. Interval redevelopment of small to moderate right pleural effusion. 4.  Aortic Atherosclerois (  ICD10-170.0)  Past Medical History:  Diagnosis Date   DM (diabetes mellitus) ([place])    GERD (gastroesophageal reflux disease)    Hypertension    Iron deficiency anemia    NSCL ca dx'd 03/2018     Past Surgical History:  Procedure Laterality Date   CATARACT  EXTRACTION Right    CHOLECYSTECTOMY     COLONOSCOPY  10/24/2009   normal rectum/1X1cm abnormal lesion in the ascending colon (bx benign). TI normal for 10cm.  Prep difficult/inadequate. f/u TCS 09/2012 recommended   COLONOSCOPY  10/13/2004   Normal rectum/Diminutive polyps, splenic flexure, cold biopsied/removed.  Remainder of colonic mucosa appeared normal.   COLONOSCOPY N/A 12/14/2012   AST:[id number] polyp-tubular adenoma   ESOPHAGOGASTRODUODENOSCOPY  10/13/2004    Normal esophagus/ Nodular volcano like lesion in the antrum, either representing a  pancreatic rest or leiomyoma, biopsied.  Remainder of the gastric mucosa appeared normal, normal D1-D2   ESOPHAGOGASTRODUODENOSCOPY  10/24/2009   Benign biopsies. normal esophagus/small hiatal hernia/nodular lesion antrum/distal greater curvature. duodenal AVM s/p ablation   GIVENS CAPSULE STUDY  07/27/2010    multiple arteriovenous malformations which could definitely be the contributor to her drifting hemoglobin and hematocrit   TUBAL LIGATION     VIDEO BRONCHOSCOPY WITH ENDOBRONCHIAL ULTRASOUND N/A 04/27/2018   Procedure: VIDEO BRONCHOSCOPY WITH ENDOBRONCHIAL ULTRASOUND;  Surgeon: Melrose Nakayama, MD;  Location: [facility] OR;  Service: Thoracic;  Laterality: N/A;   Family History  Problem Relation Age of Onset   Colon cancer Maternal Aunt        greater than age 7   Breast cancer Cousin    Amblyopia Neg Hx    Blindness Neg Hx    Cataracts Neg Hx    Diabetes Neg Hx    Glaucoma Neg Hx    Macular degeneration Neg Hx    Retinal detachment Neg Hx    Strabismus Neg Hx    Retinitis pigmentosa Neg Hx    Social History   Tobacco Use   Smoking status: Former Smoker    Packs/day: 0.50    Years: 55.00    Pack years: 27.50    Types: Cigarettes    Last attempt to quit: 03/18/2018    Years since quitting: 0.6   Smokeless tobacco: Never Used   Tobacco comment: smoked off and n  Substance Use Topics   Alcohol use: No   Drug  use: No   Current Outpatient Medications  Medication Sig Dispense Refill   dexlansoprazole (DEXILANT) 60 MG capsule 1 BY MOUTH 30 MIN BEFORE BREAKFAST DAILY 90 capsule 0   diphenhydrAMINE (BENADRYL) 2 % cream Apply 1 application topically as needed for itching.     docusate sodium (COLACE) 100 MG capsule Take 100 mg by mouth daily as needed for mild constipation.     ferrous sulfate 325 (65 FE) MG tablet Take 325 mg by mouth 2 (two) times daily with a meal.      glipiZIDE (GLUCOTROL) 10 MG tablet Take 1 tablet (10 mg total) by mouth 2 (two) times daily before a meal. 180 tablet 0   labetalol (NORMODYNE) 100 MG tablet Take 1 tablet (100 mg total) by mouth 2 (two) times daily. 90 tablet 0   Multiple Vitamin (MULTIVITAMIN WITH MINERALS) TABS tablet Take 1 tablet by mouth at bedtime.      prochlorperazine (COMPAZINE) 10 MG tablet Take 1 tablet (10 mg total) by mouth every 6 (six) hours as needed for nausea or vomiting. 30 tablet 0   simvastatin (ZOCOR) 20 MG  tablet Take 1 tablet (20 mg total) by mouth every evening. 90 tablet 0   sucralfate (CARAFATE) 1 g tablet Take 1 tablet (1 g total) by mouth 4 (four) times daily. 120 tablet 2   No current facility-administered medications for this visit.    Facility-Administered Medications Ordered in Other Visits  Medication Dose Route Frequency Provider Last Rate Last Dose   promethazine (PHENERGAN) injection 12.5 mg  12.5 mg Intravenous Once Harle Stanford., PA-C       Tbo-Filgrastim Galen Daft) injection 300 mcg  300 mcg Subcutaneous Once Curt Bears, MD       Allergies  Allergen Reactions   Paclitaxel Other (See Comments)    Unresponsiveness shortly after Taxol inf started 06/12/18.   Aspirin Other (See Comments)    Stomach bleeding    Esomeprazole Magnesium     UNSPECIFIED REACTION    Ace Inhibitors Other (See Comments)    Dizziness, drunk like     Review of Systems: All systems reviewed and negative except where noted in  HPI.    Ct Chest W Contrast  Result Date: 10/31/2018 CLINICAL DATA:  Lung cancer.  Ongoing chemotherapy. EXAM: CT CHEST WITH CONTRAST TECHNIQUE: Multidetector CT imaging of the chest was performed during intravenous contrast administration. CONTRAST:  61mL OMNIPAQUE IOHEXOL 300 MG/ML  SOLN COMPARISON:  07/31/2018 FINDINGS: Cardiovascular: The heart size is normal. No substantial pericardial effusion. Coronary artery calcification is evident. Atherosclerotic calcification is noted in the wall of the thoracic aorta. Mediastinum/Nodes: 12 mm short axis paraesophageal node (29/2) is slightly increased from 9 mm previously. 8 mm short axis node towards the right thoracic inlet (23/2) was 5 mm previously. Index node measured in the right supraclavicular region on the previous study at 8 mm now measures 7 mm. Right hilar lymphadenopathy measured in long axis on the previous study at 2.2 cm is stable at 2.2 cm today. The esophagus has normal imaging features. There is no axillary lymphadenopathy. Lungs/Pleura: The central tracheobronchial airways are patent. Centrilobular emphsyema noted. Right middle lobe collapse seen on the previous study has decreased in the interval. There is diffuse interstitial and patchy airspace disease in the medial right upper lobe and parahilar right middle and right lower lobes, markedly progressed in the interval. Patchy ground-glass attenuation in the central left lower lobe is similar to prior. Several scattered ill-defined tiny nodular opacities in the left lower lobe are again noted. The index 4 mm nodule identified on the previous study is stable. Small to moderate right pleural effusion is new since prior. Upper Abdomen: Unremarkable. Musculoskeletal: No worrisome lytic or sclerotic osseous abnormality. IMPRESSION: 1. Right middle lobe pulmonary mass measured previously is not measurable today given the extensive adjacent airspace disease in the right upper and lower lobes. Right  middle lobe collapse seen on the previous study has decreased in the interval. Worsening interstitial and airspace disease in the parahilar right lung today may be sequelae of radiation therapy, but inflammation/infection not excluded. 2. No substantial change in the right supraclavicular and right mediastinal/paratracheal lymphadenopathy with some lymph nodes measuring minimally bigger on today's study and others measuring minimally smaller. Right hilar disease also stable. 3. Interval redevelopment of small to moderate right pleural effusion. 4.  Aortic Atherosclerois (ICD10-170.0) Electronically Signed   By: Misty Stanley M.D.   On: 10/31/2018 11:24    Physical Exam: NA  ASSESSMENT AND PLAN:  73 y/o female referred for evaluation of the following:  Occult positive stools / anemia / constipation / history of  colon polyps - history as above, I suspect this is likely due to small bowel AVMs given her prior workup; however, anemia has worsened leading to transfusions in recent months. I discussed the situation with her and options. She has not had her upper tract evaluated since 2011 and colonoscopy since 2014, given her recent change I am recommending EGD and colonoscopy to further evaluate. We discussed the timing of when this should be done in light of the recent COVID-19 outbreak. The patient is quite anxious about coming down with this virus in light of her lung cancer history and wants to minimize her risk for getting this, which I agree with. In this light, if her hemoglobin is stable will hold off on EGD and colonoscopy for the next 4 weeks at least and see what the situation is in regards to her risk for this virus. Once we are scheduling elective cases we will prioritize her case to be done quickly. I discussed risks and benefits of EGD and colonoscopy as well as anesthesia and she wanted to proceed when it is safe to do so. She will have another CBC in 2 weeks with Dr. Julien Nordmann, if for some reason  she cannot maintain her hemoglobin requires ongoing transfusion we may need to perform her procedure sooner, however she's been stable since November. She agreed with the plan. Until then continue iron and I asked her to take some oral iron supplementation in the interim.   [place] Cellar, MD [facility]  CC: Dr. Curt Bears

## 2018-11-06 NOTE — Telephone Encounter (Signed)
Patient already on schedule as requested per 3/19 los.

## 2018-11-16 ENCOUNTER — Other Ambulatory Visit: Payer: Self-pay

## 2018-11-16 ENCOUNTER — Other Ambulatory Visit: Payer: Self-pay | Admitting: Physician Assistant

## 2018-11-16 ENCOUNTER — Inpatient Hospital Stay: Payer: Medicare Other

## 2018-11-16 ENCOUNTER — Inpatient Hospital Stay (HOSPITAL_BASED_OUTPATIENT_CLINIC_OR_DEPARTMENT_OTHER): Payer: Medicare Other | Admitting: Internal Medicine

## 2018-11-16 ENCOUNTER — Inpatient Hospital Stay: Payer: Medicare Other | Attending: Internal Medicine

## 2018-11-16 ENCOUNTER — Encounter: Payer: Self-pay | Admitting: Internal Medicine

## 2018-11-16 VITALS — BP 153/64 | HR 83 | Temp 98.5°F | Resp 18 | Ht 65.0 in | Wt 154.5 lb

## 2018-11-16 DIAGNOSIS — C342 Malignant neoplasm of middle lobe, bronchus or lung: Secondary | ICD-10-CM | POA: Insufficient documentation

## 2018-11-16 DIAGNOSIS — Z5112 Encounter for antineoplastic immunotherapy: Secondary | ICD-10-CM | POA: Diagnosis not present

## 2018-11-16 DIAGNOSIS — R7989 Other specified abnormal findings of blood chemistry: Secondary | ICD-10-CM

## 2018-11-16 DIAGNOSIS — R5382 Chronic fatigue, unspecified: Secondary | ICD-10-CM

## 2018-11-16 DIAGNOSIS — Z79899 Other long term (current) drug therapy: Secondary | ICD-10-CM | POA: Diagnosis not present

## 2018-11-16 LAB — CMP (CANCER CENTER ONLY)
ALT: 17 U/L (ref 0–44)
AST: 23 U/L (ref 15–41)
Albumin: 3.7 g/dL (ref 3.5–5.0)
Alkaline Phosphatase: 115 U/L (ref 38–126)
Anion gap: 8 (ref 5–15)
BUN: 19 mg/dL (ref 8–23)
CO2: 23 mmol/L (ref 22–32)
Calcium: 9.4 mg/dL (ref 8.9–10.3)
Chloride: 107 mmol/L (ref 98–111)
Creatinine: 1.08 mg/dL — ABNORMAL HIGH (ref 0.44–1.00)
GFR, Est AFR Am: 59 mL/min — ABNORMAL LOW (ref >60)
GFR, Estimated: 51 mL/min — ABNORMAL LOW (ref >60)
Glucose, Bld: 182 mg/dL — ABNORMAL HIGH (ref 70–99)
Potassium: 4.3 mmol/L (ref 3.5–5.1)
Sodium: 138 mmol/L (ref 135–145)
Total Bilirubin: 0.2 mg/dL — ABNORMAL LOW (ref 0.3–1.2)
Total Protein: 8.7 g/dL — ABNORMAL HIGH (ref 6.5–8.1)

## 2018-11-16 LAB — TSH: TSH: 42.63 u[IU]/mL — ABNORMAL HIGH (ref 0.308–3.960)

## 2018-11-16 LAB — CBC WITH DIFFERENTIAL (CANCER CENTER ONLY)
Abs Immature Granulocytes: 0.01 10*3/uL (ref 0.00–0.07)
Basophils Absolute: 0 10*3/uL (ref 0.0–0.1)
Basophils Relative: 1 %
Eosinophils Absolute: 0.1 10*3/uL (ref 0.0–0.5)
Eosinophils Relative: 2 %
HCT: 33.3 % — ABNORMAL LOW (ref 36.0–46.0)
Hemoglobin: 9.8 g/dL — ABNORMAL LOW (ref 12.0–15.0)
Immature Granulocytes: 0 %
Lymphocytes Relative: 6 %
Lymphs Abs: 0.3 10*3/uL — ABNORMAL LOW (ref 0.7–4.0)
MCH: 27.6 pg (ref 26.0–34.0)
MCHC: 29.4 g/dL — ABNORMAL LOW (ref 30.0–36.0)
MCV: 93.8 fL (ref 80.0–100.0)
Monocytes Absolute: 0.2 10*3/uL (ref 0.1–1.0)
Monocytes Relative: 4 %
Neutro Abs: 3.4 10*3/uL (ref 1.7–7.7)
Neutrophils Relative %: 87 %
Platelet Count: 247 10*3/uL (ref 150–400)
RBC: 3.55 MIL/uL — ABNORMAL LOW (ref 3.87–5.11)
RDW: 15.3 % (ref 11.5–15.5)
WBC Count: 3.9 10*3/uL — ABNORMAL LOW (ref 4.0–10.5)
nRBC: 0 % (ref 0.0–0.2)

## 2018-11-16 MED ORDER — LEVOTHYROXINE SODIUM 50 MCG PO TABS: 50.0000 ug | ORAL_TABLET | Freq: Every day | ORAL | 2 refills | Status: DC

## 2018-11-16 MED ORDER — SODIUM CHLORIDE 0.9 % IV SOLN
Freq: Once | INTRAVENOUS | Status: AC
  Administered 2018-11-16: 10:00:00 via INTRAVENOUS
  Filled 2018-11-16: qty 250

## 2018-11-16 MED ORDER — SODIUM CHLORIDE 0.9 % IV SOLN
10.8000 mg/kg | Freq: Once | INTRAVENOUS | Status: AC
  Administered 2018-11-16: 740 mg via INTRAVENOUS
  Filled 2018-11-16: qty 10

## 2018-11-16 NOTE — Patient Instructions (Signed)
[facility] Discharge Instructions for Patients Receiving Chemotherapy  Today you received the following chemotherapy agents: Imfinzi.  To help prevent nausea and vomiting after your treatment, we encourage you to take your nausea medication as directed.   If you develop nausea and vomiting that is not controlled by your nausea medication, call the clinic.   BELOW ARE SYMPTOMS THAT SHOULD BE REPORTED IMMEDIATELY:  *FEVER GREATER THAN 100.5 F  *CHILLS WITH OR WITHOUT FEVER  NAUSEA AND VOMITING THAT IS NOT CONTROLLED WITH YOUR NAUSEA MEDICATION  *UNUSUAL SHORTNESS OF BREATH  *UNUSUAL BRUISING OR BLEEDING  TENDERNESS IN MOUTH AND THROAT WITH OR WITHOUT PRESENCE OF ULCERS  *URINARY PROBLEMS  *BOWEL PROBLEMS  UNUSUAL RASH Items with * indicate a potential emergency and should be followed up as soon as possible.  Feel free to call the clinic should you have any questions or concerns. The clinic phone number is [phone].  Please show the CHEMO ALERT CARD at check-in to the Emergency Department and triage nurse.   

## 2018-11-16 NOTE — Progress Notes (Signed)
Dr. Julien Nordmann made aware of elevated TSH. Per Dr. Julien Nordmann he will address and call patient later today with new instructions. Patient aware and verbalized understanding.

## 2018-11-16 NOTE — Progress Notes (Signed)
Notified Dr. Julien Nordmann of elevated TSH.  He will address. Kennith Center, Pharm.D., CPP 11/16/2018@10 :29 AM

## 2018-11-16 NOTE — Progress Notes (Signed)
Jenna [name] [phone]   [phone]  OFFICE PROGRESS NOTE  [name], MD [place] [address]  DIAGNOSIS: Stage IIIB (T3,N3, M0)non-small cell lung cancer, squamous cell carcinoma presented with large right middle lobe lung breast in addition to mediastinal and bilateral hilar lymphadenopathy and suspicious right supraclavicular lymph node diagnosed in August 2019.   PRIOR THERAPY: A course of concurrent chemoradiation with weekly carboplatin for AUC of 2 and paclitaxel 45 MG/M2.  First dose of chemotherapy given on 05/29/2018.  Status post 5 cycles.  CURRENT THERAPY:  Consolidation treatment with immunotherapy with Imfinzi 10 mg/KG every 2 weeks.  First dose August 10, 2018.  Status post 7 cycles.  INTERVAL HISTORY: Jenna [name] 73 y.o. female returns to the clinic today for follow-up visit.  The patient is feeling fine today with no concerning complaints.  She continues to tolerate her treatment with Imfinzi fairly well.  She denied having any chest pain, shortness of breath, cough or hemoptysis.  She denied having any fever or chills.  She has no nausea, vomiting, diarrhea or constipation.  She is here today for evaluation before starting cycle #8.   MEDICAL HISTORY: Past Medical History:  Diagnosis Date  . DM (diabetes mellitus) ([place])   . GERD (gastroesophageal reflux disease)   . Hypertension   . Iron deficiency anemia   . NSCL ca dx'd 03/2018    ALLERGIES:  is allergic to paclitaxel; aspirin; esomeprazole magnesium; and ace inhibitors.  MEDICATIONS:  Current Outpatient Medications  Medication Sig Dispense Refill  . dexlansoprazole (DEXILANT) 60 MG capsule 1 BY MOUTH 30 MIN BEFORE BREAKFAST DAILY 90 capsule 0  . diphenhydrAMINE (BENADRYL) 2 % cream Apply 1 application topically as needed for itching.    . docusate sodium (COLACE) 100 MG capsule Take 100 mg by mouth daily as needed for mild  constipation.    . ferrous sulfate 325 (65 FE) MG tablet Take 325 mg by mouth 2 (two) times daily with a meal.     . glipiZIDE (GLUCOTROL) 10 MG tablet Take 1 tablet (10 mg total) by mouth 2 (two) times daily before a meal. 180 tablet 0  . labetalol (NORMODYNE) 100 MG tablet Take 1 tablet (100 mg total) by mouth 2 (two) times daily. 90 tablet 0  . Multiple Vitamin (MULTIVITAMIN WITH MINERALS) TABS tablet Take 1 tablet by mouth at bedtime.     . prochlorperazine (COMPAZINE) 10 MG tablet Take 1 tablet (10 mg total) by mouth every 6 (six) hours as needed for nausea or vomiting. 30 tablet 0  . simvastatin (ZOCOR) 20 MG tablet Take 1 tablet (20 mg total) by mouth every evening. 90 tablet 0  . sucralfate (CARAFATE) 1 g tablet Take 1 tablet (1 g total) by mouth 4 (four) times daily. 120 tablet 2   No current facility-administered medications for this visit.    Facility-Administered Medications Ordered in Other Visits  Medication Dose Route Frequency Provider Last Rate Last Dose  . promethazine (PHENERGAN) injection 12.5 mg  12.5 mg Intravenous Once Harle Stanford., PA-C      . Tbo-Filgrastim (GRANIX) injection 300 mcg  300 mcg Subcutaneous Once Curt Bears, MD        SURGICAL HISTORY:  Past Surgical History:  Procedure Laterality Date  . CATARACT EXTRACTION Right   . CHOLECYSTECTOMY    . COLONOSCOPY  10/24/2009   normal rectum/1X1cm abnormal lesion in the ascending colon (bx benign). TI normal  for 10cm.  Prep difficult/inadequate. f/u TCS 09/2012 recommended  . COLONOSCOPY  10/13/2004   Normal rectum/Diminutive polyps, splenic flexure, cold biopsied/removed.  Remainder of colonic mucosa appeared normal.  . COLONOSCOPY N/A 12/14/2012   FAO:[id number] polyp-tubular adenoma  . ESOPHAGOGASTRODUODENOSCOPY  10/13/2004    Normal esophagus/ Nodular volcano like lesion in the antrum, either representing a  pancreatic rest or leiomyoma, biopsied.  Remainder of the gastric mucosa appeared normal, normal  D1-D2  . ESOPHAGOGASTRODUODENOSCOPY  10/24/2009   Benign biopsies. normal esophagus/small hiatal hernia/nodular lesion antrum/distal greater curvature. duodenal AVM s/p ablation  . GIVENS CAPSULE STUDY  07/27/2010    multiple arteriovenous malformations which could definitely be the contributor to her drifting hemoglobin and hematocrit  . TUBAL LIGATION    . VIDEO BRONCHOSCOPY WITH ENDOBRONCHIAL ULTRASOUND N/A 04/27/2018   Procedure: VIDEO BRONCHOSCOPY WITH ENDOBRONCHIAL ULTRASOUND;  Surgeon: Melrose Nakayama, MD;  Location: [facility] OR;  Service: Thoracic;  Laterality: N/A;    REVIEW OF SYSTEMS:  A comprehensive review of systems was negative except for: Constitutional: positive for fatigue   PHYSICAL EXAMINATION: General appearance: alert, cooperative, fatigued and no distress Head: Normocephalic, without obvious abnormality, atraumatic Neck: no adenopathy, no JVD, supple, symmetrical, trachea midline and thyroid not enlarged, symmetric, no tenderness/mass/nodules Lymph nodes: Cervical, supraclavicular, and axillary nodes normal. Resp: clear to auscultation bilaterally Back: symmetric, no curvature. ROM normal. No CVA tenderness. Cardio: regular rate and rhythm, S1, S2 normal, no murmur, click, rub or gallop GI: soft, non-tender; bowel sounds normal; no masses,  no organomegaly Extremities: extremities normal, atraumatic, no cyanosis or edema  ECOG PERFORMANCE STATUS: 1 - Symptomatic but completely ambulatory  Blood pressure (!) 153/64, pulse 83, temperature 98.5 F (36.9 C), temperature source Oral, resp. rate 18, height 5\' 5"  (1.651 m), weight 154 lb 8 oz (70.1 kg), SpO2 97 %.  LABORATORY DATA: Lab Results  Component Value Date   WBC 3.9 (L) 11/16/2018   HGB 9.8 (L) 11/16/2018   HCT 33.3 (L) 11/16/2018   MCV 93.8 11/16/2018   PLT 247 11/16/2018      Chemistry      Component Value Date/Time   NA 138 11/16/2018 0850   K 4.3 11/16/2018 0850   CL 107 11/16/2018 0850   CO2 23  11/16/2018 0850   BUN 19 11/16/2018 0850   BUN 12 12/10/2011 0919   CREATININE 1.08 (H) 11/16/2018 0850   CREATININE 0.72 12/10/2011 0919      Component Value Date/Time   CALCIUM 9.4 11/16/2018 0850   ALKPHOS 115 11/16/2018 0850   ALKPHOS 88 12/10/2011 0919   AST 23 11/16/2018 0850   ALT 17 11/16/2018 0850   BILITOT 0.2 (L) 11/16/2018 0850       RADIOGRAPHIC STUDIES: Ct Chest W Contrast  Result Date: 10/31/2018 CLINICAL DATA:  Lung cancer.  Ongoing chemotherapy. EXAM: CT CHEST WITH CONTRAST TECHNIQUE: Multidetector CT imaging of the chest was performed during intravenous contrast administration. CONTRAST:  47mL OMNIPAQUE IOHEXOL 300 MG/ML  SOLN COMPARISON:  07/31/2018 FINDINGS: Cardiovascular: The heart size is normal. No substantial pericardial effusion. Coronary artery calcification is evident. Atherosclerotic calcification is noted in the wall of the thoracic aorta. Mediastinum/Nodes: 12 mm short axis paraesophageal node (29/2) is slightly increased from 9 mm previously. 8 mm short axis node towards the right thoracic inlet (23/2) was 5 mm previously. Index node measured in the right supraclavicular region on the previous study at 8 mm now measures 7 mm. Right hilar lymphadenopathy measured in long axis on the previous  study at 2.2 cm is stable at 2.2 cm today. The esophagus has normal imaging features. There is no axillary lymphadenopathy. Lungs/Pleura: The central tracheobronchial airways are patent. Centrilobular emphsyema noted. Right middle lobe collapse seen on the previous study has decreased in the interval. There is diffuse interstitial and patchy airspace disease in the medial right upper lobe and parahilar right middle and right lower lobes, markedly progressed in the interval. Patchy ground-glass attenuation in the central left lower lobe is similar to prior. Several scattered ill-defined tiny nodular opacities in the left lower lobe are again noted. The index 4 mm nodule  identified on the previous study is stable. Small to moderate right pleural effusion is new since prior. Upper Abdomen: Unremarkable. Musculoskeletal: No worrisome lytic or sclerotic osseous abnormality. IMPRESSION: 1. Right middle lobe pulmonary mass measured previously is not measurable today given the extensive adjacent airspace disease in the right upper and lower lobes. Right middle lobe collapse seen on the previous study has decreased in the interval. Worsening interstitial and airspace disease in the parahilar right lung today may be sequelae of radiation therapy, but inflammation/infection not excluded. 2. No substantial change in the right supraclavicular and right mediastinal/paratracheal lymphadenopathy with some lymph nodes measuring minimally bigger on today's study and others measuring minimally smaller. Right hilar disease also stable. 3. Interval redevelopment of small to moderate right pleural effusion. 4.  Aortic Atherosclerois (ICD10-170.0) Electronically Signed   By: Misty Stanley M.D.   On: 10/31/2018 11:24    ASSESSMENT AND PLAN: This is a very pleasant 73 years old African-American female recently diagnosed with a stage IIIB non-small cell lung cancer, squamous cell carcinoma.  She completed a course of concurrent chemoradiation with weekly carboplatin and paclitaxel status post 5 cycles with partial response.  She tolerated this treatment well except for the pancytopenia and fatigue.  The patient is currently undergoing treatment with consolidation immunotherapy with Imfinzi status post 7 cycles. The patient is doing fine today with no concerning complaints.  She is tolerating her treatment with Imfinzi fairly well. I recommended for her to proceed with cycle #8 today as scheduled. I will see her back for follow-up visit in 2 weeks for evaluation before starting cycle #9. She was advised to call immediately if she has any concerning symptoms in the interval. The patient voices  understanding of current disease status and treatment options and is in agreement with the current care plan. All questions were answered. The patient knows to call the clinic with any problems, questions or concerns. We can certainly see the patient much sooner if necessary.  Disclaimer: This note was dictated with voice recognition software. Similar sounding words can inadvertently be transcribed and may not be corrected upon review.

## 2018-11-17 ENCOUNTER — Telehealth: Payer: Self-pay | Admitting: Internal Medicine

## 2018-11-17 NOTE — Telephone Encounter (Signed)
Called regarding additions to schedule  °

## 2018-11-21 ENCOUNTER — Ambulatory Visit (INDEPENDENT_AMBULATORY_CARE_PROVIDER_SITE_OTHER): Payer: Medicare Other | Admitting: Family Medicine

## 2018-11-21 VITALS — BP 154/63

## 2018-11-21 DIAGNOSIS — E039 Hypothyroidism, unspecified: Secondary | ICD-10-CM | POA: Diagnosis not present

## 2018-11-21 DIAGNOSIS — I160 Hypertensive urgency: Secondary | ICD-10-CM

## 2018-11-21 DIAGNOSIS — E785 Hyperlipidemia, unspecified: Secondary | ICD-10-CM | POA: Diagnosis not present

## 2018-11-21 DIAGNOSIS — E119 Type 2 diabetes mellitus without complications: Secondary | ICD-10-CM

## 2018-11-21 DIAGNOSIS — I10 Essential (primary) hypertension: Secondary | ICD-10-CM | POA: Diagnosis not present

## 2018-11-21 DIAGNOSIS — C342 Malignant neoplasm of middle lobe, bronchus or lung: Secondary | ICD-10-CM

## 2018-11-21 MED ORDER — FERROUS SULFATE 325 (65 FE) MG PO TABS: 325.0000 mg | ORAL_TABLET | Freq: Two times a day (BID) | ORAL | 1 refills | Status: DC

## 2018-11-21 MED ORDER — GLIPIZIDE 10 MG PO TABS: 10.0000 mg | ORAL_TABLET | Freq: Two times a day (BID) | ORAL | 1 refills | Status: DC

## 2018-11-21 MED ORDER — LABETALOL HCL 100 MG PO TABS: 100.0000 mg | ORAL_TABLET | Freq: Two times a day (BID) | ORAL | 1 refills | Status: DC

## 2018-11-21 MED ORDER — SIMVASTATIN 20 MG PO TABS: 20.0000 mg | ORAL_TABLET | Freq: Every evening | ORAL | 1 refills | Status: DC

## 2018-11-21 NOTE — Assessment & Plan Note (Signed)
Refills sent. Add a1c to future labs drawn at cancer center. The patient indicates understanding of these issues and agrees with the plan.

## 2018-11-21 NOTE — Assessment & Plan Note (Signed)
eRx refills sent.

## 2018-11-21 NOTE — Assessment & Plan Note (Signed)
Continue current dose of statin. I have added lipid panel to be drawn when she next has labs drawn at cancer center. eRx refills sent. The patient indicates understanding of these issues and agrees with the plan.

## 2018-11-21 NOTE — Assessment & Plan Note (Signed)
New diagnosis- explained importance with pt of picking up synthroid that was called in by her oncologist and to read instructions about how to take it (30 minutes prior to other medications and food). I have added additional labs to be added to her next cancer center lab draw. The patient indicates understanding of these issues and agrees with the plan. Orders Placed This Encounter  Procedures  . TSH  . T4, free  . Hemoglobin A1c  . T3  . Lipid panel

## 2018-11-21 NOTE — Progress Notes (Signed)
TELEPHONE ENCOUNTER   Patient verbally agreed to telephone visit and is aware that copayment and coinsurance may apply. Patient was treated using telemedicine according to accepted telemedicine protocols.  Location of the patient: home Location of provider: home Names of all persons participating in the telemedicine service and role in the encounter: Arnette Norris, MD Karma Lew    Subjective:   Chief Complaint  Patient presents with  . Follow-up    Pt agrees to Tele-Visit/She states that she is doing well on her medications.  She states that she does not take anything for thyroid. Said that Dr. Mora Appl said something about thyroid but nurse that called her to set up appt on 16th for Tx did not mention it.  Has 20 more appointments to do for Tx. Rx's that arre needing refills are prepared and pending.     HPI  Chart reviewed.  Lung cancer- middle lobe of right lung.  Followed by Dr. Aundra Dubin.  She is followed by Dr. Earlie Server.  Was last seen by him on 11/16/18. CURRENT THERAPY: Consolidation treatment with immunotherapy with Imfinzi 10 mg/KG every 2 weeks.  First dose August 10, 2018.  Status post 7 cycles. She received an infusion that day.  Also had blood work and was found to have hypothyroidism.    Hypothyroidism-  TSH VERY high.  It appears synthroid 50 mcg daily was sent to her pharmacy but she was not aware and did not pick it up. She has been tired but denies any other symptoms of hypothyroidism.  Lab Results  Component Value Date   TSH 42.630 (H) 11/16/2018   Lab Results  Component Value Date   WBC 3.9 (L) 11/16/2018   HGB 9.8 (L) 11/16/2018   HCT 33.3 (L) 11/16/2018   MCV 93.8 11/16/2018   PLT 247 11/16/2018   Lab Results  Component Value Date   NA 138 11/16/2018   K 4.3 11/16/2018   CL 107 11/16/2018   CO2 23 11/16/2018   Lab Results  Component Value Date   ALT 17 11/16/2018   AST 23 11/16/2018   ALKPHOS 115 11/16/2018   BILITOT 0.2 (L)  11/16/2018   HTN- needs her medications refilled.  BP and blood work checked often at cancer center.  She has run out of her medications. BP Readings from Last 3 Encounters:  11/21/18 (!) 154/63  11/16/18 (!) 153/64  11/02/18 (!) 151/58   HLD- taking zocor 20 mg daily. Lab Results  Component Value Date   CHOL 123 03/20/2018   HDL 35.30 (L) 03/20/2018   LDLCALC 69 03/20/2018   TRIG 91.0 03/20/2018   CHOLHDL 3 03/20/2018   DM- does not check FSBS regularly.  Denies any symptoms of hypoglycemia. Currently taking glucotrol 10 mg twice daily. Lab Results  Component Value Date   HGBA1C 7.8 (A) 03/20/2018        Patient Active Problem List   Diagnosis Date Noted  . Hypothyroidism 11/21/2018  . Encounter for antineoplastic immunotherapy 08/03/2018  . Malignant neoplasm of middle lobe of right lung ([place]) 05/04/2018  . Goals of care, counseling/discussion 05/04/2018  . Encounter for antineoplastic chemotherapy 05/04/2018  . Cough 03/23/2018  . HLD (hyperlipidemia) 03/20/2018  . AVM (arteriovenous malformation) 03/15/2012  . GERD 09/26/2009  . CHANGE IN BOWELS 09/26/2009  . Type 2 diabetes mellitus without complications ([place]) 53/29/9242  . OVERWEIGHT 09/25/2009  . Iron deficiency anemia 09/25/2009  . SMOKER 09/25/2009  . Hypertension 09/25/2009  . CHOLECYSTITIS, HX OF 09/25/2009  Social History   Tobacco Use  . Smoking status: Former Smoker    Packs/day: 0.50    Years: 55.00    Pack years: 27.50    Types: Cigarettes    Last attempt to quit: 03/18/2018    Years since quitting: 0.6  . Smokeless tobacco: Never Used  . Tobacco comment: smoked off and n  Substance Use Topics  . Alcohol use: No    Current Outpatient Medications:  .  dexlansoprazole (DEXILANT) 60 MG capsule, 1 BY MOUTH 30 MIN BEFORE BREAKFAST DAILY, Disp: 90 capsule, Rfl: 0 .  diphenhydrAMINE (BENADRYL) 2 % cream, Apply 1 application topically as needed for itching., Disp: , Rfl:  .  docusate sodium  (COLACE) 100 MG capsule, Take 100 mg by mouth daily as needed for mild constipation., Disp: , Rfl:  .  ferrous sulfate 325 (65 FE) MG tablet, Take 1 tablet (325 mg total) by mouth 2 (two) times daily with a meal., Disp: 180 tablet, Rfl: 1 .  glipiZIDE (GLUCOTROL) 10 MG tablet, Take 1 tablet (10 mg total) by mouth 2 (two) times daily before a meal., Disp: 180 tablet, Rfl: 1 .  labetalol (NORMODYNE) 100 MG tablet, Take 1 tablet (100 mg total) by mouth 2 (two) times daily., Disp: 180 tablet, Rfl: 1 .  Multiple Vitamin (MULTIVITAMIN WITH MINERALS) TABS tablet, Take 1 tablet by mouth at bedtime. , Disp: , Rfl:  .  prochlorperazine (COMPAZINE) 10 MG tablet, Take 1 tablet (10 mg total) by mouth every 6 (six) hours as needed for nausea or vomiting., Disp: 30 tablet, Rfl: 0 .  simvastatin (ZOCOR) 20 MG tablet, Take 1 tablet (20 mg total) by mouth every evening., Disp: 90 tablet, Rfl: 1 .  levothyroxine (SYNTHROID) 50 MCG tablet, Take 1 tablet (50 mcg total) by mouth daily before breakfast. (Patient not taking: Reported on 11/21/2018), Disp: 30 tablet, Rfl: 2 .  sucralfate (CARAFATE) 1 g tablet, Take 1 tablet (1 g total) by mouth 4 (four) times daily. (Patient not taking: Reported on 11/21/2018), Disp: 120 tablet, Rfl: 2 No current facility-administered medications for this visit.   Facility-Administered Medications Ordered in Other Visits:  .  promethazine (PHENERGAN) injection 12.5 mg, 12.5 mg, Intravenous, Once, Tanner, Lyndon Code., PA-C .  Tbo-Filgrastim (GRANIX) injection 300 mcg, 300 mcg, Subcutaneous, Once, Curt Bears, MD Allergies  Allergen Reactions  . Paclitaxel Other (See Comments)    Unresponsiveness shortly after Taxol inf started 06/12/18.  . Aspirin Other (See Comments)    Stomach bleeding   . Esomeprazole Magnesium     UNSPECIFIED REACTION   . Ace Inhibitors Other (See Comments)    Dizziness, drunk like    Assessment & Plan:   1. Hyperlipidemia, unspecified hyperlipidemia type   2.  Essential hypertension   3. Asymptomatic hypertensive urgency   4. Hypothyroidism, unspecified type   5. Type 2 diabetes mellitus without complication, without long-term current use of insulin (HCC)     No orders of the defined types were placed in this encounter.  Meds ordered this encounter  Medications  . glipiZIDE (GLUCOTROL) 10 MG tablet    Sig: Take 1 tablet (10 mg total) by mouth 2 (two) times daily before a meal.    Dispense:  180 tablet    Refill:  1  . labetalol (NORMODYNE) 100 MG tablet    Sig: Take 1 tablet (100 mg total) by mouth 2 (two) times daily.    Dispense:  180 tablet    Refill:  1  .  simvastatin (ZOCOR) 20 MG tablet    Sig: Take 1 tablet (20 mg total) by mouth every evening.    Dispense:  90 tablet    Refill:  1  . ferrous sulfate 325 (65 FE) MG tablet    Sig: Take 1 tablet (325 mg total) by mouth 2 (two) times daily with a meal.    Dispense:  180 tablet    Refill:  1    Arnette Norris, MD 11/21/2018  Time spent with the patient:  25 minutes, spent in obtaining information about her symptoms, reviewing her previous labs, evaluations, and treatments, counseling her about her condition (please see the discussed topics above), and developing a plan to further investigate it; she had a number of questions which I addressed.   [id number] physician/qualified health professional telephone evaluation 5 to 10 minutes [id number] physician/qualified help functional [place] evaluation for 11 to 20 minutes [id number] physician/qualify he will professional telephone evaluation for 21 to 30 minutes

## 2018-11-30 ENCOUNTER — Encounter: Payer: Self-pay | Admitting: Internal Medicine

## 2018-11-30 ENCOUNTER — Inpatient Hospital Stay: Payer: Medicare Other

## 2018-11-30 ENCOUNTER — Other Ambulatory Visit: Payer: Self-pay

## 2018-11-30 ENCOUNTER — Inpatient Hospital Stay (HOSPITAL_BASED_OUTPATIENT_CLINIC_OR_DEPARTMENT_OTHER): Payer: Medicare Other | Admitting: Internal Medicine

## 2018-11-30 VITALS — BP 151/57 | HR 90 | Temp 98.4°F | Resp 20 | Ht 65.0 in | Wt 157.5 lb

## 2018-11-30 DIAGNOSIS — Z5112 Encounter for antineoplastic immunotherapy: Secondary | ICD-10-CM

## 2018-11-30 DIAGNOSIS — R0609 Other forms of dyspnea: Secondary | ICD-10-CM | POA: Diagnosis not present

## 2018-11-30 DIAGNOSIS — Z79899 Other long term (current) drug therapy: Secondary | ICD-10-CM | POA: Diagnosis not present

## 2018-11-30 DIAGNOSIS — C342 Malignant neoplasm of middle lobe, bronchus or lung: Secondary | ICD-10-CM

## 2018-11-30 DIAGNOSIS — I10 Essential (primary) hypertension: Secondary | ICD-10-CM

## 2018-11-30 LAB — CBC WITH DIFFERENTIAL (CANCER CENTER ONLY)
Abs Immature Granulocytes: 0.01 10*3/uL (ref 0.00–0.07)
Basophils Absolute: 0 10*3/uL (ref 0.0–0.1)
Basophils Relative: 1 %
Eosinophils Absolute: 0.1 10*3/uL (ref 0.0–0.5)
Eosinophils Relative: 2 %
HCT: 31.3 % — ABNORMAL LOW (ref 36.0–46.0)
Hemoglobin: 9.6 g/dL — ABNORMAL LOW (ref 12.0–15.0)
Immature Granulocytes: 0 %
Lymphocytes Relative: 7 %
Lymphs Abs: 0.2 10*3/uL — ABNORMAL LOW (ref 0.7–4.0)
MCH: 28.3 pg (ref 26.0–34.0)
MCHC: 30.7 g/dL (ref 30.0–36.0)
MCV: 92.3 fL (ref 80.0–100.0)
Monocytes Absolute: 0.2 10*3/uL (ref 0.1–1.0)
Monocytes Relative: 4 %
Neutro Abs: 3.1 10*3/uL (ref 1.7–7.7)
Neutrophils Relative %: 86 %
Platelet Count: 194 10*3/uL (ref 150–400)
RBC: 3.39 MIL/uL — ABNORMAL LOW (ref 3.87–5.11)
RDW: 15.4 % (ref 11.5–15.5)
WBC Count: 3.6 10*3/uL — ABNORMAL LOW (ref 4.0–10.5)
nRBC: 0 % (ref 0.0–0.2)

## 2018-11-30 LAB — CMP (CANCER CENTER ONLY)
ALT: 23 U/L (ref 0–44)
AST: 27 U/L (ref 15–41)
Albumin: 3.4 g/dL — ABNORMAL LOW (ref 3.5–5.0)
Alkaline Phosphatase: 100 U/L (ref 38–126)
Anion gap: 9 (ref 5–15)
BUN: 20 mg/dL (ref 8–23)
CO2: 22 mmol/L (ref 22–32)
Calcium: 9.1 mg/dL (ref 8.9–10.3)
Chloride: 104 mmol/L (ref 98–111)
Creatinine: 1.17 mg/dL — ABNORMAL HIGH (ref 0.44–1.00)
GFR, Est AFR Am: 54 mL/min — ABNORMAL LOW (ref >60)
GFR, Estimated: 47 mL/min — ABNORMAL LOW (ref >60)
Glucose, Bld: 221 mg/dL — ABNORMAL HIGH (ref 70–99)
Potassium: 4.9 mmol/L (ref 3.5–5.1)
Sodium: 135 mmol/L (ref 135–145)
Total Bilirubin: <0.2 mg/dL — ABNORMAL LOW (ref 0.3–1.2)
Total Protein: 8.2 g/dL — ABNORMAL HIGH (ref 6.5–8.1)

## 2018-11-30 MED ORDER — SODIUM CHLORIDE 0.9 % IV SOLN
Freq: Once | INTRAVENOUS | Status: AC
  Administered 2018-11-30: 09:00:00 via INTRAVENOUS
  Filled 2018-11-30: qty 250

## 2018-11-30 MED ORDER — SODIUM CHLORIDE 0.9 % IV SOLN
10.8000 mg/kg | Freq: Once | INTRAVENOUS | Status: AC
  Administered 2018-11-30: 740 mg via INTRAVENOUS
  Filled 2018-11-30: qty 10

## 2018-11-30 NOTE — Progress Notes (Signed)
[place] Telephone:([phone]   [phone]  OFFICE PROGRESS NOTE  [name], MD [place] [address]  DIAGNOSIS: Stage IIIB (T3,N3, M0)non-small cell lung cancer, squamous cell carcinoma presented with large right middle lobe lung breast in addition to mediastinal and bilateral hilar lymphadenopathy and suspicious right supraclavicular lymph node diagnosed in August 2019.   PRIOR THERAPY: A course of concurrent chemoradiation with weekly carboplatin for AUC of 2 and paclitaxel 45 MG/M2.  First dose of chemotherapy given on 05/29/2018.  Status post 5 cycles.  CURRENT THERAPY:  Consolidation treatment with immunotherapy with Imfinzi 10 mg/KG every 2 weeks.  First dose August 10, 2018.  Status post 8 cycles.  INTERVAL HISTORY: Jenna [name] 73 y.o. female returns to the clinic today for follow-up visit.  The patient is feeling fine today with no concerning complaints except for mild shortness of breath with exertion.  She denied having any chest pain, cough or hemoptysis.  She denied having any fever or chills.  She has no nausea, vomiting, diarrhea or constipation.  She denied having any headache or visual changes.  The patient has no weight loss or night sweats.  She is here today for evaluation before starting cycle #9.  MEDICAL HISTORY: Past Medical History:  Diagnosis Date  . DM (diabetes mellitus) ([place])   . GERD (gastroesophageal reflux disease)   . Hypertension   . Iron deficiency anemia   . NSCL ca dx'd 03/2018    ALLERGIES:  is allergic to paclitaxel; aspirin; esomeprazole magnesium; and ace inhibitors.  MEDICATIONS:  Current Outpatient Medications  Medication Sig Dispense Refill  . dexlansoprazole (DEXILANT) 60 MG capsule 1 BY MOUTH 30 MIN BEFORE BREAKFAST DAILY 90 capsule 0  . diphenhydrAMINE (BENADRYL) 2 % cream Apply 1 application topically as needed for itching.    . docusate sodium (COLACE) 100 MG  capsule Take 100 mg by mouth daily as needed for mild constipation.    . ferrous sulfate 325 (65 FE) MG tablet Take 1 tablet (325 mg total) by mouth 2 (two) times daily with a meal. 180 tablet 1  . glipiZIDE (GLUCOTROL) 10 MG tablet Take 1 tablet (10 mg total) by mouth 2 (two) times daily before a meal. 180 tablet 1  . labetalol (NORMODYNE) 100 MG tablet Take 1 tablet (100 mg total) by mouth 2 (two) times daily. 180 tablet 1  . levothyroxine (SYNTHROID) 50 MCG tablet Take 1 tablet (50 mcg total) by mouth daily before breakfast. (Patient not taking: Reported on 11/21/2018) 30 tablet 2  . Multiple Vitamin (MULTIVITAMIN WITH MINERALS) TABS tablet Take 1 tablet by mouth at bedtime.     . prochlorperazine (COMPAZINE) 10 MG tablet Take 1 tablet (10 mg total) by mouth every 6 (six) hours as needed for nausea or vomiting. 30 tablet 0  . simvastatin (ZOCOR) 20 MG tablet Take 1 tablet (20 mg total) by mouth every evening. 90 tablet 1  . sucralfate (CARAFATE) 1 g tablet Take 1 tablet (1 g total) by mouth 4 (four) times daily. (Patient not taking: Reported on 11/21/2018) 120 tablet 2   No current facility-administered medications for this visit.    Facility-Administered Medications Ordered in Other Visits  Medication Dose Route Frequency Provider Last Rate Last Dose  . promethazine (PHENERGAN) injection 12.5 mg  12.5 mg Intravenous Once Harle Stanford., PA-C      . Tbo-Filgrastim (GRANIX) injection 300 mcg  300 mcg Subcutaneous Once [place],  MD        SURGICAL HISTORY:  Past Surgical History:  Procedure Laterality Date  . CATARACT EXTRACTION Right   . CHOLECYSTECTOMY    . COLONOSCOPY  10/24/2009   normal rectum/1X1cm abnormal lesion in the ascending colon (bx benign). TI normal for 10cm.  Prep difficult/inadequate. f/u TCS 09/2012 recommended  . COLONOSCOPY  10/13/2004   Normal rectum/Diminutive polyps, splenic flexure, cold biopsied/removed.  Remainder of colonic mucosa appeared normal.  .  COLONOSCOPY N/A 12/14/2012   OZD:[id number] polyp-tubular adenoma  . ESOPHAGOGASTRODUODENOSCOPY  10/13/2004    Normal esophagus/ Nodular volcano like lesion in the antrum, either representing a  pancreatic rest or leiomyoma, biopsied.  Remainder of the gastric mucosa appeared normal, normal D1-D2  . ESOPHAGOGASTRODUODENOSCOPY  10/24/2009   Benign biopsies. normal esophagus/small hiatal hernia/nodular lesion antrum/distal greater curvature. duodenal AVM s/p ablation  . GIVENS CAPSULE STUDY  07/27/2010    multiple arteriovenous malformations which could definitely be the contributor to her drifting hemoglobin and hematocrit  . TUBAL LIGATION    . VIDEO BRONCHOSCOPY WITH ENDOBRONCHIAL ULTRASOUND N/A 04/27/2018   Procedure: VIDEO BRONCHOSCOPY WITH ENDOBRONCHIAL ULTRASOUND;  Surgeon: Melrose Nakayama, MD;  Location: [facility] OR;  Service: Thoracic;  Laterality: N/A;    REVIEW OF SYSTEMS:  A comprehensive review of systems was negative except for: Constitutional: positive for fatigue Respiratory: positive for dyspnea on exertion   PHYSICAL EXAMINATION: General appearance: alert, cooperative, fatigued and no distress Head: Normocephalic, without obvious abnormality, atraumatic Neck: no adenopathy, no JVD, supple, symmetrical, trachea midline and thyroid not enlarged, symmetric, no tenderness/mass/nodules Lymph nodes: Cervical, supraclavicular, and axillary nodes normal. Resp: clear to auscultation bilaterally Back: symmetric, no curvature. ROM normal. No CVA tenderness. Cardio: regular rate and rhythm, S1, S2 normal, no murmur, click, rub or gallop GI: soft, non-tender; bowel sounds normal; no masses,  no organomegaly Extremities: extremities normal, atraumatic, no cyanosis or edema  ECOG PERFORMANCE STATUS: 1 - Symptomatic but completely ambulatory  Blood pressure (!) 151/57, pulse 90, temperature 98.4 F (36.9 C), temperature source Oral, resp. rate 20, height 5\' 5"  (1.651 m), weight 157 lb 8 oz  (71.4 kg), SpO2 100 %.  LABORATORY DATA: Lab Results  Component Value Date   WBC 3.9 (L) 11/16/2018   HGB 9.8 (L) 11/16/2018   HCT 33.3 (L) 11/16/2018   MCV 93.8 11/16/2018   PLT 247 11/16/2018      Chemistry      Component Value Date/Time   NA 138 11/16/2018 0850   K 4.3 11/16/2018 0850   CL 107 11/16/2018 0850   CO2 23 11/16/2018 0850   BUN 19 11/16/2018 0850   BUN 12 12/10/2011 0919   CREATININE 1.08 (H) 11/16/2018 0850   CREATININE 0.72 12/10/2011 0919      Component Value Date/Time   CALCIUM 9.4 11/16/2018 0850   ALKPHOS 115 11/16/2018 0850   ALKPHOS 88 12/10/2011 0919   AST 23 11/16/2018 0850   ALT 17 11/16/2018 0850   BILITOT 0.2 (L) 11/16/2018 0850       RADIOGRAPHIC STUDIES: Ct Chest W Contrast  Result Date: 10/31/2018 CLINICAL DATA:  Lung cancer.  Ongoing chemotherapy. EXAM: CT CHEST WITH CONTRAST TECHNIQUE: Multidetector CT imaging of the chest was performed during intravenous contrast administration. CONTRAST:  4mL OMNIPAQUE IOHEXOL 300 MG/ML  SOLN COMPARISON:  07/31/2018 FINDINGS: Cardiovascular: The heart size is normal. No substantial pericardial effusion. Coronary artery calcification is evident. Atherosclerotic calcification is noted in the wall of the thoracic aorta. Mediastinum/Nodes: 12 mm short axis  paraesophageal node (29/2) is slightly increased from 9 mm previously. 8 mm short axis node towards the right thoracic inlet (23/2) was 5 mm previously. Index node measured in the right supraclavicular region on the previous study at 8 mm now measures 7 mm. Right hilar lymphadenopathy measured in long axis on the previous study at 2.2 cm is stable at 2.2 cm today. The esophagus has normal imaging features. There is no axillary lymphadenopathy. Lungs/Pleura: The central tracheobronchial airways are patent. Centrilobular emphsyema noted. Right middle lobe collapse seen on the previous study has decreased in the interval. There is diffuse interstitial and patchy  airspace disease in the medial right upper lobe and parahilar right middle and right lower lobes, markedly progressed in the interval. Patchy ground-glass attenuation in the central left lower lobe is similar to prior. Several scattered ill-defined tiny nodular opacities in the left lower lobe are again noted. The index 4 mm nodule identified on the previous study is stable. Small to moderate right pleural effusion is new since prior. Upper Abdomen: Unremarkable. Musculoskeletal: No worrisome lytic or sclerotic osseous abnormality. IMPRESSION: 1. Right middle lobe pulmonary mass measured previously is not measurable today given the extensive adjacent airspace disease in the right upper and lower lobes. Right middle lobe collapse seen on the previous study has decreased in the interval. Worsening interstitial and airspace disease in the parahilar right lung today may be sequelae of radiation therapy, but inflammation/infection not excluded. 2. No substantial change in the right supraclavicular and right mediastinal/paratracheal lymphadenopathy with some lymph nodes measuring minimally bigger on today's study and others measuring minimally smaller. Right hilar disease also stable. 3. Interval redevelopment of small to moderate right pleural effusion. 4.  Aortic Atherosclerois (ICD10-170.0) Electronically Signed   By: Misty Stanley M.D.   On: 10/31/2018 11:24    ASSESSMENT AND PLAN: This is a very pleasant 73 years old African-American female recently diagnosed with a stage IIIB non-small cell lung cancer, squamous cell carcinoma.  She completed a course of concurrent chemoradiation with weekly carboplatin and paclitaxel status post 5 cycles with partial response.  She tolerated this treatment well except for the pancytopenia and fatigue.  The patient is currently undergoing treatment with consolidation immunotherapy with Imfinzi status post 8 cycles. The patient continues to tolerate this treatment well with no  concerning adverse effects. I recommended for her to proceed with cycle #9 today as scheduled. She will come back for follow-up visit in 2 weeks for evaluation before the next cycle of her treatment. She was advised to call immediately if she has any concerning symptoms in the interval. The patient voices understanding of current disease status and treatment options and is in agreement with the current care plan. All questions were answered. The patient knows to call the clinic with any problems, questions or concerns. We can certainly see the patient much sooner if necessary.  Disclaimer: This note was dictated with voice recognition software. Similar sounding words can inadvertently be transcribed and may not be corrected upon review.

## 2018-11-30 NOTE — Patient Instructions (Signed)
[facility] Discharge Instructions for Patients Receiving Chemotherapy  Today you received the following chemotherapy agents: Imfinzi.  To help prevent nausea and vomiting after your treatment, we encourage you to take your nausea medication as directed.   If you develop nausea and vomiting that is not controlled by your nausea medication, call the clinic.   BELOW ARE SYMPTOMS THAT SHOULD BE REPORTED IMMEDIATELY:  *FEVER GREATER THAN 100.5 F  *CHILLS WITH OR WITHOUT FEVER  NAUSEA AND VOMITING THAT IS NOT CONTROLLED WITH YOUR NAUSEA MEDICATION  *UNUSUAL SHORTNESS OF BREATH  *UNUSUAL BRUISING OR BLEEDING  TENDERNESS IN MOUTH AND THROAT WITH OR WITHOUT PRESENCE OF ULCERS  *URINARY PROBLEMS  *BOWEL PROBLEMS  UNUSUAL RASH Items with * indicate a potential emergency and should be followed up as soon as possible.  Feel free to call the clinic should you have any questions or concerns. The clinic phone number is [phone].  Please show the CHEMO ALERT CARD at check-in to the Emergency Department and triage nurse.   

## 2018-12-11 ENCOUNTER — Telehealth: Payer: Self-pay

## 2018-12-11 NOTE — Telephone Encounter (Signed)
Called and spoke to pt. Offered her ECL on May 5th, Tuesday afternoon. She will have to find a ride and indicated she will call me back and let me know.  She will need a previsit this week.

## 2018-12-11 NOTE — Telephone Encounter (Signed)
Pt called back; scheduled her for Tuesday 5-5 with Dr. Havery Moros for an [facility] in the [place]. No BT. Scheduled Previsit for Thursday, 4-30.Pt understands she must have ride that can stay and wait.  No BT.

## 2018-12-12 ENCOUNTER — Ambulatory Visit: Payer: Medicare Other | Admitting: Internal Medicine

## 2018-12-14 ENCOUNTER — Other Ambulatory Visit: Payer: Self-pay

## 2018-12-14 ENCOUNTER — Encounter: Payer: Self-pay | Admitting: Internal Medicine

## 2018-12-14 ENCOUNTER — Ambulatory Visit (AMBULATORY_SURGERY_CENTER): Payer: Self-pay

## 2018-12-14 ENCOUNTER — Inpatient Hospital Stay (HOSPITAL_BASED_OUTPATIENT_CLINIC_OR_DEPARTMENT_OTHER): Payer: Medicare Other | Admitting: Internal Medicine

## 2018-12-14 ENCOUNTER — Inpatient Hospital Stay: Payer: Medicare Other

## 2018-12-14 ENCOUNTER — Telehealth: Payer: Self-pay | Admitting: Internal Medicine

## 2018-12-14 VITALS — Ht 65.0 in | Wt 157.0 lb

## 2018-12-14 VITALS — BP 130/71 | HR 80 | Temp 98.5°F | Resp 18 | Ht 65.0 in | Wt 164.5 lb

## 2018-12-14 DIAGNOSIS — K922 Gastrointestinal hemorrhage, unspecified: Secondary | ICD-10-CM | POA: Diagnosis not present

## 2018-12-14 DIAGNOSIS — E039 Hypothyroidism, unspecified: Secondary | ICD-10-CM

## 2018-12-14 DIAGNOSIS — C342 Malignant neoplasm of middle lobe, bronchus or lung: Secondary | ICD-10-CM

## 2018-12-14 DIAGNOSIS — D500 Iron deficiency anemia secondary to blood loss (chronic): Secondary | ICD-10-CM

## 2018-12-14 DIAGNOSIS — R5382 Chronic fatigue, unspecified: Secondary | ICD-10-CM

## 2018-12-14 DIAGNOSIS — Z79899 Other long term (current) drug therapy: Secondary | ICD-10-CM | POA: Diagnosis not present

## 2018-12-14 DIAGNOSIS — R195 Other fecal abnormalities: Secondary | ICD-10-CM

## 2018-12-14 DIAGNOSIS — Z5112 Encounter for antineoplastic immunotherapy: Secondary | ICD-10-CM

## 2018-12-14 LAB — CBC WITH DIFFERENTIAL (CANCER CENTER ONLY)
Abs Immature Granulocytes: 0.01 10*3/uL (ref 0.00–0.07)
Basophils Absolute: 0 10*3/uL (ref 0.0–0.1)
Basophils Relative: 0 %
Eosinophils Absolute: 0.1 10*3/uL (ref 0.0–0.5)
Eosinophils Relative: 3 %
HCT: 28.4 % — ABNORMAL LOW (ref 36.0–46.0)
Hemoglobin: 8.5 g/dL — ABNORMAL LOW (ref 12.0–15.0)
Immature Granulocytes: 0 %
Lymphocytes Relative: 7 %
Lymphs Abs: 0.2 10*3/uL — ABNORMAL LOW (ref 0.7–4.0)
MCH: 27.5 pg (ref 26.0–34.0)
MCHC: 29.9 g/dL — ABNORMAL LOW (ref 30.0–36.0)
MCV: 91.9 fL (ref 80.0–100.0)
Monocytes Absolute: 0.2 10*3/uL (ref 0.1–1.0)
Monocytes Relative: 5 %
Neutro Abs: 3 10*3/uL (ref 1.7–7.7)
Neutrophils Relative %: 85 %
Platelet Count: 182 10*3/uL (ref 150–400)
RBC: 3.09 MIL/uL — ABNORMAL LOW (ref 3.87–5.11)
RDW: 15.9 % — ABNORMAL HIGH (ref 11.5–15.5)
WBC Count: 3.5 10*3/uL — ABNORMAL LOW (ref 4.0–10.5)
nRBC: 0 % (ref 0.0–0.2)

## 2018-12-14 LAB — CMP (CANCER CENTER ONLY)
ALT: 21 U/L (ref 0–44)
AST: 27 U/L (ref 15–41)
Albumin: 3.4 g/dL — ABNORMAL LOW (ref 3.5–5.0)
Alkaline Phosphatase: 102 U/L (ref 38–126)
Anion gap: 10 (ref 5–15)
BUN: 16 mg/dL (ref 8–23)
CO2: 22 mmol/L (ref 22–32)
Calcium: 8.8 mg/dL — ABNORMAL LOW (ref 8.9–10.3)
Chloride: 105 mmol/L (ref 98–111)
Creatinine: 1.07 mg/dL — ABNORMAL HIGH (ref 0.44–1.00)
GFR, Est AFR Am: >60 mL/min (ref >60)
GFR, Estimated: 52 mL/min — ABNORMAL LOW (ref >60)
Glucose, Bld: 215 mg/dL — ABNORMAL HIGH (ref 70–99)
Potassium: 4 mmol/L (ref 3.5–5.1)
Sodium: 137 mmol/L (ref 135–145)
Total Bilirubin: <0.2 mg/dL — ABNORMAL LOW (ref 0.3–1.2)
Total Protein: 7.9 g/dL (ref 6.5–8.1)

## 2018-12-14 LAB — TSH: TSH: 22.871 u[IU]/mL — ABNORMAL HIGH (ref 0.308–3.960)

## 2018-12-14 MED ORDER — SODIUM CHLORIDE 0.9 % IV SOLN
Freq: Once | INTRAVENOUS | Status: AC
  Administered 2018-12-14: 11:00:00 via INTRAVENOUS
  Filled 2018-12-14: qty 250

## 2018-12-14 MED ORDER — NA SULFATE-K SULFATE-MG SULF 17.5-3.13-1.6 GM/177ML PO SOLN: 1.0000 | Freq: Once | ORAL | 0 refills | Status: AC

## 2018-12-14 MED ORDER — SODIUM CHLORIDE 0.9 % IV SOLN
10.8000 mg/kg | Freq: Once | INTRAVENOUS | Status: AC
  Administered 2018-12-14: 740 mg via INTRAVENOUS
  Filled 2018-12-14: qty 4.8

## 2018-12-14 NOTE — Patient Instructions (Signed)
[place] Discharge Instructions for Patients Receiving Chemotherapy  Today you received the following chemotherapy agents:  durvalumab (Imfinzi)  To help prevent nausea and vomiting after your treatment, we encourage you to take your nausea medication as prescribed.   If you develop nausea and vomiting that is not controlled by your nausea medication, call the clinic.   BELOW ARE SYMPTOMS THAT SHOULD BE REPORTED IMMEDIATELY:  *FEVER GREATER THAN 100.5 F  *CHILLS WITH OR WITHOUT FEVER  NAUSEA AND VOMITING THAT IS NOT CONTROLLED WITH YOUR NAUSEA MEDICATION  *UNUSUAL SHORTNESS OF BREATH  *UNUSUAL BRUISING OR BLEEDING  TENDERNESS IN MOUTH AND THROAT WITH OR WITHOUT PRESENCE OF ULCERS  *URINARY PROBLEMS  *BOWEL PROBLEMS  UNUSUAL RASH Items with * indicate a potential emergency and should be followed up as soon as possible.  Feel free to call the clinic should you have any questions or concerns. The clinic phone number is [phone].  Please show the [place] at check-in to the Emergency Department and triage nurse.

## 2018-12-14 NOTE — Progress Notes (Signed)
[facility] [phone]   Fax:([phone]  OFFICE PROGRESS NOTE  [name], MD [place] [address]  DIAGNOSIS: Stage IIIB (T3,N3, M0)non-small cell lung cancer, squamous cell carcinoma presented with large right middle lobe lung breast in addition to mediastinal and bilateral hilar lymphadenopathy and suspicious right supraclavicular lymph node diagnosed in August 2019.   PRIOR THERAPY: A course of concurrent chemoradiation with weekly carboplatin for AUC of 2 and paclitaxel 45 MG/M2.  First dose of chemotherapy given on 05/29/2018.  Status post 5 cycles.  CURRENT THERAPY:  Consolidation treatment with immunotherapy with Imfinzi 10 mg/KG every 2 weeks.  First dose August 10, 2018.  Status post 9 cycles.  INTERVAL HISTORY: Jenna [name] 73 y.o. female returns to the clinic today for follow-up visit.  The patient is feeling fine today with no concerning complaints except for fatigue secondary to anemia.  She is scheduled for upper endoscopy and colonoscopy next week.  She denied having any current chest pain, shortness of breath, cough or hemoptysis.  She denied having any fever or chills.  She has no nausea, vomiting, diarrhea or constipation.  She continues to tolerate her treatment fairly well.  She is here for evaluation before starting cycle #10.   MEDICAL HISTORY: Past Medical History:  Diagnosis Date  . DM (diabetes mellitus) ([place])   . GERD (gastroesophageal reflux disease)   . Hypertension   . Iron deficiency anemia   . NSCL ca dx'd 03/2018    ALLERGIES:  is allergic to paclitaxel; aspirin; esomeprazole magnesium; and ace inhibitors.  MEDICATIONS:  Current Outpatient Medications  Medication Sig Dispense Refill  . dexlansoprazole (DEXILANT) 60 MG capsule 1 BY MOUTH 30 MIN BEFORE BREAKFAST DAILY 90 capsule 0  . diphenhydrAMINE (BENADRYL) 2 % cream Apply 1 application topically as needed for itching.     . docusate sodium (COLACE) 100 MG capsule Take 100 mg by mouth daily as needed for mild constipation.    . ferrous sulfate 325 (65 FE) MG tablet Take 1 tablet (325 mg total) by mouth 2 (two) times daily with a meal. 180 tablet 1  . glipiZIDE (GLUCOTROL) 10 MG tablet Take 1 tablet (10 mg total) by mouth 2 (two) times daily before a meal. 180 tablet 1  . labetalol (NORMODYNE) 100 MG tablet Take 1 tablet (100 mg total) by mouth 2 (two) times daily. 180 tablet 1  . levothyroxine (SYNTHROID) 50 MCG tablet Take 1 tablet (50 mcg total) by mouth daily before breakfast. (Patient not taking: Reported on 11/21/2018) 30 tablet 2  . Multiple Vitamin (MULTIVITAMIN WITH MINERALS) TABS tablet Take 1 tablet by mouth at bedtime.     . prochlorperazine (COMPAZINE) 10 MG tablet Take 1 tablet (10 mg total) by mouth every 6 (six) hours as needed for nausea or vomiting. 30 tablet 0  . simvastatin (ZOCOR) 20 MG tablet Take 1 tablet (20 mg total) by mouth every evening. 90 tablet 1  . sucralfate (CARAFATE) 1 g tablet Take 1 tablet (1 g total) by mouth 4 (four) times daily. (Patient not taking: Reported on 11/21/2018) 120 tablet 2   No current facility-administered medications for this visit.    Facility-Administered Medications Ordered in Other Visits  Medication Dose Route Frequency Provider Last Rate Last Dose  . promethazine (PHENERGAN) injection 12.5 mg  12.5 mg Intravenous Once Harle Stanford., PA-C      . Tbo-Filgrastim (GRANIX) injection 300 mcg  300 mcg Subcutaneous  Once Curt Bears, MD        SURGICAL HISTORY:  Past Surgical History:  Procedure Laterality Date  . CATARACT EXTRACTION Right   . CHOLECYSTECTOMY    . COLONOSCOPY  10/24/2009   normal rectum/1X1cm abnormal lesion in the ascending colon (bx benign). TI normal for 10cm.  Prep difficult/inadequate. f/u TCS 09/2012 recommended  . COLONOSCOPY  10/13/2004   Normal rectum/Diminutive polyps, splenic flexure, cold biopsied/removed.  Remainder of colonic  mucosa appeared normal.  . COLONOSCOPY N/A 12/14/2012   [id number] polyp-tubular adenoma  . ESOPHAGOGASTRODUODENOSCOPY  10/13/2004    Normal esophagus/ Nodular volcano like lesion in the antrum, either representing a  pancreatic rest or leiomyoma, biopsied.  Remainder of the gastric mucosa appeared normal, normal D1-D2  . ESOPHAGOGASTRODUODENOSCOPY  10/24/2009   Benign biopsies. normal esophagus/small hiatal hernia/nodular lesion antrum/distal greater curvature. duodenal AVM s/p ablation  . GIVENS CAPSULE STUDY  07/27/2010    multiple arteriovenous malformations which could definitely be the contributor to her drifting hemoglobin and hematocrit  . TUBAL LIGATION    . VIDEO BRONCHOSCOPY WITH ENDOBRONCHIAL ULTRASOUND N/A 04/27/2018   Procedure: VIDEO BRONCHOSCOPY WITH ENDOBRONCHIAL ULTRASOUND;  Surgeon: Melrose Nakayama, MD;  Location: [facility] OR;  Service: Thoracic;  Laterality: N/A;    REVIEW OF SYSTEMS:  A comprehensive review of systems was negative except for: Constitutional: positive for fatigue   PHYSICAL EXAMINATION: General appearance: alert, cooperative, fatigued and no distress Head: Normocephalic, without obvious abnormality, atraumatic Neck: no adenopathy, no JVD, supple, symmetrical, trachea midline and thyroid not enlarged, symmetric, no tenderness/mass/nodules Lymph nodes: Cervical, supraclavicular, and axillary nodes normal. Resp: clear to auscultation bilaterally Back: symmetric, no curvature. ROM normal. No CVA tenderness. Cardio: regular rate and rhythm, S1, S2 normal, no murmur, click, rub or gallop GI: soft, non-tender; bowel sounds normal; no masses,  no organomegaly Extremities: extremities normal, atraumatic, no cyanosis or edema  ECOG PERFORMANCE STATUS: 1 - Symptomatic but completely ambulatory  Blood pressure 130/71, pulse 80, temperature 98.5 F (36.9 C), temperature source Oral, resp. rate 18, height 5\' 5"  (1.651 m), weight 164 lb 8 oz (74.6 kg), SpO2 99  %.  LABORATORY DATA: Lab Results  Component Value Date   WBC 3.5 (L) 12/14/2018   HGB 8.5 (L) 12/14/2018   HCT 28.4 (L) 12/14/2018   MCV 91.9 12/14/2018   PLT 182 12/14/2018      Chemistry      Component Value Date/Time   NA 135 11/30/2018 0759   K 4.9 11/30/2018 0759   CL 104 11/30/2018 0759   CO2 22 11/30/2018 0759   BUN 20 11/30/2018 0759   BUN 12 12/10/2011 0919   CREATININE 1.17 (H) 11/30/2018 0759   CREATININE 0.72 12/10/2011 0919      Component Value Date/Time   CALCIUM 9.1 11/30/2018 0759   ALKPHOS 100 11/30/2018 0759   ALKPHOS 88 12/10/2011 0919   AST 27 11/30/2018 0759   ALT 23 11/30/2018 0759   BILITOT <0.2 (L) 11/30/2018 0759       RADIOGRAPHIC STUDIES: No results found.  ASSESSMENT AND PLAN: This is a very pleasant 73 years old African-American female recently diagnosed with a stage IIIB non-small cell lung cancer, squamous cell carcinoma.  She completed a course of concurrent chemoradiation with weekly carboplatin and paclitaxel status post 5 cycles with partial response.  She tolerated this treatment well except for the pancytopenia and fatigue.  The patient is currently undergoing treatment with consolidation immunotherapy with Imfinzi status post 9 cycles. The patient continues to  tolerate her treatment well with no concerning adverse effects. I recommended for her to proceed with cycle #10 today as scheduled. For the iron deficiency anemia from gastrointestinal blood loss, she is scheduled for upper endoscopy and colonoscopy next week. The patient will come back for follow-up visit in 2 weeks for evaluation before starting cycle #11. She was advised to call immediately if she has any concerning symptoms in the interval. The patient voices understanding of current disease status and treatment options and is in agreement with the current care plan. All questions were answered. The patient knows to call the clinic with any problems, questions or concerns.  We can certainly see the patient much sooner if necessary.  Disclaimer: This note was dictated with voice recognition software. Similar sounding words can inadvertently be transcribed and may not be corrected upon review.

## 2018-12-14 NOTE — Progress Notes (Signed)
Denies allergies to eggs or soy products. Denies complication of anesthesia or sedation. Denies use of weight loss medication. Denies use of O2.   Emmi instructions given for colonoscopy.  Pre-Visit was conducted by phone due to Covid 19. Instructions were reviewed with the patient. Instructions were mailed out on 12/13/18 to ensure that the patient will receive it in time before her procedure. Patient was encouraged to call us if she had any questions or concerns.

## 2018-12-14 NOTE — Telephone Encounter (Signed)
3 cycles already scheduled per 4/30 los.

## 2018-12-18 ENCOUNTER — Telehealth: Payer: Self-pay | Admitting: *Deleted

## 2018-12-18 NOTE — Telephone Encounter (Signed)
Covid-19 travel screening questions  Have you traveled in the last 14 days? no If yes where?  Do you now or have you had a fever in the last 14 days? no  Do you have any respiratory symptoms of shortness of breath or cough now or in the last 14 days? no  Do you have any family members or close contacts with diagnosed or suspected Covid-19? No  Pt is aware that her care partner will be waiting in the car during procedure.  She will wear a mask into building

## 2018-12-19 ENCOUNTER — Ambulatory Visit (AMBULATORY_SURGERY_CENTER): Payer: Medicare Other | Admitting: Gastroenterology

## 2018-12-19 ENCOUNTER — Encounter: Payer: Self-pay | Admitting: Gastroenterology

## 2018-12-19 ENCOUNTER — Other Ambulatory Visit: Payer: Self-pay

## 2018-12-19 VITALS — BP 127/88 | HR 92 | Temp 98.8°F | Resp 17 | Ht 65.0 in | Wt 157.0 lb

## 2018-12-19 DIAGNOSIS — K5520 Angiodysplasia of colon without hemorrhage: Secondary | ICD-10-CM

## 2018-12-19 DIAGNOSIS — K5730 Diverticulosis of large intestine without perforation or abscess without bleeding: Secondary | ICD-10-CM | POA: Diagnosis not present

## 2018-12-19 DIAGNOSIS — D508 Other iron deficiency anemias: Secondary | ICD-10-CM

## 2018-12-19 DIAGNOSIS — K2970 Gastritis, unspecified, without bleeding: Secondary | ICD-10-CM | POA: Diagnosis not present

## 2018-12-19 DIAGNOSIS — K31819 Angiodysplasia of stomach and duodenum without bleeding: Secondary | ICD-10-CM

## 2018-12-19 DIAGNOSIS — D123 Benign neoplasm of transverse colon: Secondary | ICD-10-CM

## 2018-12-19 DIAGNOSIS — K2951 Unspecified chronic gastritis with bleeding: Secondary | ICD-10-CM | POA: Diagnosis not present

## 2018-12-19 DIAGNOSIS — R195 Other fecal abnormalities: Secondary | ICD-10-CM | POA: Diagnosis not present

## 2018-12-19 MED ORDER — SODIUM CHLORIDE 0.9 % IV SOLN: 500.0000 mL | Freq: Once | INTRAVENOUS | Status: DC

## 2018-12-19 NOTE — Progress Notes (Signed)
PT taken to PACU. Monitors in place. VSS. Report given to RN. 

## 2018-12-19 NOTE — Op Note (Signed)
[facility] Patient Name: Jenna [name] Procedure Date: 12/19/2018 2:08 PM MRN: [id number] Endoscopist: Remo Lipps P. Havery Moros , MD Age: 73 Referring MD:  Date of Birth: 12/26/1945 Gender: Female Account #: [id number] Procedure:                Colonoscopy Indications:              Heme positive stool, history of iron deficiency                            anenmia, history of small bowel AVMs Medicines:                Monitored Anesthesia Care Procedure:                Pre-Anesthesia Assessment:                           - Prior to the procedure, a History and Physical                            was performed, and patient medications and                            allergies were reviewed. The patient's tolerance of                            previous anesthesia was also reviewed. The risks                            and benefits of the procedure and the sedation                            options and risks were discussed with the patient.                            All questions were answered, and informed consent                            was obtained. Prior Anticoagulants: The patient has                            taken no previous anticoagulant or antiplatelet                            agents. ASA Grade Assessment: III - A patient with                            severe systemic disease. After reviewing the risks                            and benefits, the patient was deemed in                            satisfactory condition to undergo the procedure.  After obtaining informed consent, the colonoscope                            was passed under direct vision. Throughout the                            procedure, the patient's blood pressure, pulse, and                            oxygen saturations were monitored continuously. The                            Model PCF-H190DL [id number]) scope was introduced                            through the  anus and advanced to the the terminal                            ileum, with identification of the appendiceal                            orifice and IC valve. The colonoscopy was performed                            without difficulty. The patient tolerated the                            procedure well. The quality of the bowel                            preparation was good. The terminal ileum, ileocecal                            valve, appendiceal orifice, and rectum were                            photographed. Scope In: 2:36:24 PM Scope Out: 2:53:45 PM Scope Withdrawal Time: 0 hours 12 minutes 13 seconds  Total Procedure Duration: 0 hours 17 minutes 21 seconds  Findings:                 The perianal and digital rectal examinations were                            normal.                           The terminal ileum appeared normal.                           A single large angiodysplastic lesion was found in                            the cecum.  Three sessile polyps were found in the transverse                            colon. The polyps were 4 to 5 mm in size. These                            polyps were removed with a cold snare. Resection                            and retrieval were complete.                           A few small-mouthed diverticula were found in the                            sigmoid colon.                           The exam was otherwise without abnormality. Complications:            No immediate complications. Estimated blood loss:                            Minimal. Estimated Blood Loss:     Estimated blood loss was minimal. Impression:               - The examined portion of the ileum was normal.                           - A single colonic angiodysplastic lesion.                           - Three 4 to 5 mm polyps in the transverse colon,                            removed with a cold snare. Resected and retrieved.                            - Diverticulosis in the sigmoid colon.                           - The examination was otherwise normal.                           Overall, AVMs are the most likely cause of occult                            positive stool / anemia. Largest lesion seen in the                            cecum (small in duodenum). Recommendation:           - Patient has a contact number available for  emergencies. The signs and symptoms of potential                            delayed complications were discussed with the                            patient. Return to normal activities tomorrow.                            Written discharge instructions were provided to the                            patient.                           - Resume previous diet.                           - Continue present medications.                           - Await pathology results.                           - Close monitoring of CBC, with IV iron as needed.                            If anemia worsens, would recommend ablation of AVMs                            at the hospital (using APC, not available at our                            center today) [place]. Armbruster, MD 12/19/2018 2:59:44 PM This report has been signed electronically.

## 2018-12-19 NOTE — Op Note (Signed)
[facility] Patient Name: Jenna [name] Procedure Date: 12/19/2018 2:09 PM MRN: [id number] Endoscopist: Remo Lipps P. Havery Moros , MD Age: 73 Referring MD:  Date of Birth: Dec 30, 1945 Gender: Female Account #: [id number] Procedure:                Upper GI endoscopy Indications:              Iron deficiency anemia, Occult blood in stool,                            history of AVMs Medicines:                Monitored Anesthesia Care Procedure:                Pre-Anesthesia Assessment:                           - Prior to the procedure, a History and Physical                            was performed, and patient medications and                            allergies were reviewed. The patient's tolerance of                            previous anesthesia was also reviewed. The risks                            and benefits of the procedure and the sedation                            options and risks were discussed with the patient.                            All questions were answered, and informed consent                            was obtained. Prior Anticoagulants: The patient has                            taken no previous anticoagulant or antiplatelet                            agents. ASA Grade Assessment: III - A patient with                            severe systemic disease. After reviewing the risks                            and benefits, the patient was deemed in                            satisfactory condition to undergo the procedure.  After obtaining informed consent, the endoscope was                            passed under direct vision. Throughout the                            procedure, the patient's blood pressure, pulse, and                            oxygen saturations were monitored continuously. The                            Endoscope was introduced through the mouth, and                            advanced to the second part of  duodenum. The upper                            GI endoscopy was accomplished without difficulty.                            The patient tolerated the procedure well. Scope In: Scope Out: Findings:                 Esophagogastric landmarks were identified: the                            Z-line was found at 37 cm, the gastroesophageal                            junction was found at 37 cm and the upper extent of                            the gastric folds was found at 37 cm from the                            incisors.                           The exam of the esophagus was otherwise normal.                           A single 6 mm papule (nodule) was found in the                            gastric antrum, appeared to be umbilicated and                            subepithelial, suspicious for benign pancreatic                            rest. Biopsies were taken with a cold forceps for  histology.                           Patchy mildly erythematous mucosa was found in the                            gastric body and in the gastric antrum. Biopsies                            were taken with a cold forceps for Helicobacter                            pylori testing.                           The exam of the stomach was otherwise normal.                           A few small angiodysplastic lesions were found in                            the duodenal bulb and in the second portion of the                            duodenum (3 total - 1 bulb, 2 in 2nd portion - all                            small).                           The exam of the duodenum was otherwise normal. Complications:            No immediate complications. Estimated blood loss:                            Minimal. Estimated Blood Loss:     Estimated blood loss was minimal. Impression:               - Esophagogastric landmarks identified.                           - Normal esophagus                            - A single papule (nodule) found in the stomach,                            suspect benign pancreatic rest. Biopsied.                           - Erythematous mucosa in the gastric body and                            antrum. Biopsied.                           -  A few angiodysplastic lesions in the duodenum.                           - Otherwise normal duodenum.                           Suspect AVMs are the likely cause for anemia /                            occult positive blood. Recommendation:           - Patient has a contact number available for                            emergencies. The signs and symptoms of potential                            delayed complications were discussed with the                            patient. Return to normal activities tomorrow.                            Written discharge instructions were provided to the                            patient.                           - Resume previous diet.                           - Continue present medications.                           - Await pathology results.                           - Close monitoring of CBC, IV iron as needed. If                            anemia worsens / persists, consideration for                            ablation of AVMs at the hospital with APC. Remo Lipps P. Armbruster, MD 12/19/2018 3:05:14 PM This report has been signed electronically.

## 2018-12-19 NOTE — Patient Instructions (Signed)
3 polyps removed today. Dr Havery Moros sent this tissue to the lab for pathology.  He will receive the report and in turn mail you a letter explaining these findings. He also took biopsies from your stomach to test for a specific bacteria called Hpylori.  If you indeed do have this infection Dr Havery Moros will call you and order the appropriate medicines. You can not operate heavy machinery or a motor vehicle today.  Take it easy.  Eat a light meal that's not to spicy or greasy.  Please call the doctor on call if you have any symtoms listed below.    YOU HAD AN ENDOSCOPIC PROCEDURE TODAY AT [facility]:   Refer to the procedure report that was given to you for any specific questions about what was found during the examination.  If the procedure report does not answer your questions, please call your gastroenterologist to clarify.  If you requested that your care partner not be given the details of your procedure findings, then the procedure report has been included in a sealed envelope for you to review at your convenience later.  YOU SHOULD EXPECT: Some feelings of bloating in the abdomen. Passage of more gas than usual.  Walking can help get rid of the air that was put into your GI tract during the procedure and reduce the bloating. If you had a lower endoscopy (such as a colonoscopy or flexible sigmoidoscopy) you may notice spotting of blood in your stool or on the toilet paper. If you underwent a bowel prep for your procedure, you may not have a normal bowel movement for a few days.  Please Note:  You might notice some irritation and congestion in your nose or some drainage.  This is from the oxygen used during your procedure.  There is no need for concern and it should clear up in a day or so.  SYMPTOMS TO REPORT IMMEDIATELY:   Following lower endoscopy (colonoscopy or flexible sigmoidoscopy):  Excessive amounts of blood in the stool  Significant tenderness or worsening of  abdominal pains  Swelling of the abdomen that is new, acute  Fever of 100F or higher   Following upper endoscopy (EGD)  Vomiting of blood or coffee ground material  New chest pain or pain under the shoulder blades  Painful or persistently difficult swallowing  New shortness of breath  Fever of 100F or higher  Black, tarry-looking stools  For urgent or emergent issues, a gastroenterologist can be reached at any hour by calling [phone].   DIET:  We do recommend a small meal at first, but then you may proceed to your regular diet.  Drink plenty of fluids but you should avoid alcoholic beverages for 24 hours.  ACTIVITY:  You should plan to take it easy for the rest of today and you should NOT DRIVE or use heavy machinery until tomorrow (because of the sedation medicines used during the test).    FOLLOW UP: Our staff will call the number listed on your records the next business day following your procedure to check on you and address any questions or concerns that you may have regarding the information given to you following your procedure. If we do not reach you, we will leave a message.  However, if you are feeling well and you are not experiencing any problems, there is no need to return our call.  We will assume that you have returned to your regular daily activities without incident. We will be calling  you two weeks following your procedure to see if you have developed any symptoms of the COVID-19.  If you develop any symptoms before then, please let us know.  If any biopsies were taken you will be contacted by phone or by letter within the next 1-3 weeks.  Please call us at [phone] if you have not heard about the biopsies in 3 weeks.    SIGNATURES/CONFIDENTIALITY: You and/or your care partner have signed paperwork which will be entered into your electronic medical record.  These signatures attest to the fact that that the information above on your After Visit Summary has  been reviewed and is understood.  Full responsibility of the confidentiality of this discharge information lies with you and/or your care-partner.

## 2018-12-21 ENCOUNTER — Encounter: Payer: Self-pay | Admitting: Internal Medicine

## 2018-12-21 ENCOUNTER — Telehealth: Payer: Self-pay

## 2018-12-21 NOTE — Telephone Encounter (Signed)
  Follow up Call-  Call back number 12/19/2018  Post procedure Call Back phone  # [phone]  Permission to leave phone message Yes  Some recent data might be hidden     Patient questions:  Do you have a fever, pain , or abdominal swelling? No. Pain Score  0 *  Have you tolerated food without any problems? Yes.    Have you been able to return to your normal activities? Yes.    Do you have any questions about your discharge instructions: Diet   No. Medications  No. Follow up visit  No.  Do you have questions or concerns about your Care? No.  Actions: * If pain score is 4 or above: No action needed, pain <4.  No problems noted per pt. maw

## 2018-12-28 ENCOUNTER — Inpatient Hospital Stay: Payer: Medicare Other

## 2018-12-28 ENCOUNTER — Inpatient Hospital Stay: Payer: Medicare Other | Attending: Internal Medicine

## 2018-12-28 ENCOUNTER — Inpatient Hospital Stay (HOSPITAL_BASED_OUTPATIENT_CLINIC_OR_DEPARTMENT_OTHER): Payer: Medicare Other | Admitting: Internal Medicine

## 2018-12-28 ENCOUNTER — Encounter: Payer: Self-pay | Admitting: Internal Medicine

## 2018-12-28 ENCOUNTER — Other Ambulatory Visit: Payer: Self-pay

## 2018-12-28 VITALS — BP 154/53 | HR 87 | Temp 98.7°F | Resp 20 | Ht 65.0 in | Wt 167.9 lb

## 2018-12-28 DIAGNOSIS — R5383 Other fatigue: Secondary | ICD-10-CM

## 2018-12-28 DIAGNOSIS — Z79899 Other long term (current) drug therapy: Secondary | ICD-10-CM | POA: Insufficient documentation

## 2018-12-28 DIAGNOSIS — D509 Iron deficiency anemia, unspecified: Secondary | ICD-10-CM

## 2018-12-28 DIAGNOSIS — C342 Malignant neoplasm of middle lobe, bronchus or lung: Secondary | ICD-10-CM

## 2018-12-28 DIAGNOSIS — Q2730 Arteriovenous malformation, site unspecified: Secondary | ICD-10-CM

## 2018-12-28 DIAGNOSIS — D500 Iron deficiency anemia secondary to blood loss (chronic): Secondary | ICD-10-CM

## 2018-12-28 DIAGNOSIS — Z5112 Encounter for antineoplastic immunotherapy: Secondary | ICD-10-CM | POA: Diagnosis not present

## 2018-12-28 DIAGNOSIS — E039 Hypothyroidism, unspecified: Secondary | ICD-10-CM

## 2018-12-28 LAB — CMP (CANCER CENTER ONLY)
ALT: 17 U/L (ref 0–44)
AST: 19 U/L (ref 15–41)
Albumin: 3.4 g/dL — ABNORMAL LOW (ref 3.5–5.0)
Alkaline Phosphatase: 101 U/L (ref 38–126)
Anion gap: 10 (ref 5–15)
BUN: 20 mg/dL (ref 8–23)
CO2: 24 mmol/L (ref 22–32)
Calcium: 9.4 mg/dL (ref 8.9–10.3)
Chloride: 108 mmol/L (ref 98–111)
Creatinine: 1.1 mg/dL — ABNORMAL HIGH (ref 0.44–1.00)
GFR, Est AFR Am: 58 mL/min — ABNORMAL LOW (ref >60)
GFR, Estimated: 50 mL/min — ABNORMAL LOW (ref >60)
Glucose, Bld: 159 mg/dL — ABNORMAL HIGH (ref 70–99)
Potassium: 4.3 mmol/L (ref 3.5–5.1)
Sodium: 142 mmol/L (ref 135–145)
Total Bilirubin: <0.2 mg/dL — ABNORMAL LOW (ref 0.3–1.2)
Total Protein: 7.9 g/dL (ref 6.5–8.1)

## 2018-12-28 LAB — CBC WITH DIFFERENTIAL (CANCER CENTER ONLY)
Abs Immature Granulocytes: 0.02 10*3/uL (ref 0.00–0.07)
Basophils Absolute: 0 10*3/uL (ref 0.0–0.1)
Basophils Relative: 0 %
Eosinophils Absolute: 0.1 10*3/uL (ref 0.0–0.5)
Eosinophils Relative: 1 %
HCT: 28.7 % — ABNORMAL LOW (ref 36.0–46.0)
Hemoglobin: 8.5 g/dL — ABNORMAL LOW (ref 12.0–15.0)
Immature Granulocytes: 1 %
Lymphocytes Relative: 6 %
Lymphs Abs: 0.3 10*3/uL — ABNORMAL LOW (ref 0.7–4.0)
MCH: 27.8 pg (ref 26.0–34.0)
MCHC: 29.6 g/dL — ABNORMAL LOW (ref 30.0–36.0)
MCV: 93.8 fL (ref 80.0–100.0)
Monocytes Absolute: 0.3 10*3/uL (ref 0.1–1.0)
Monocytes Relative: 6 %
Neutro Abs: 3.8 10*3/uL (ref 1.7–7.7)
Neutrophils Relative %: 86 %
Platelet Count: 185 10*3/uL (ref 150–400)
RBC: 3.06 MIL/uL — ABNORMAL LOW (ref 3.87–5.11)
RDW: 16.6 % — ABNORMAL HIGH (ref 11.5–15.5)
WBC Count: 4.4 10*3/uL (ref 4.0–10.5)
nRBC: 0 % (ref 0.0–0.2)

## 2018-12-28 MED ORDER — SODIUM CHLORIDE 0.9 % IV SOLN
Freq: Once | INTRAVENOUS | Status: AC
  Administered 2018-12-28: 09:00:00 via INTRAVENOUS
  Filled 2018-12-28: qty 250

## 2018-12-28 MED ORDER — SODIUM CHLORIDE 0.9 % IV SOLN
10.8000 mg/kg | Freq: Once | INTRAVENOUS | Status: AC
  Administered 2018-12-28: 10:00:00 740 mg via INTRAVENOUS
  Filled 2018-12-28: qty 10

## 2018-12-28 NOTE — Patient Instructions (Signed)
[place] Discharge Instructions for Patients Receiving Chemotherapy  Today you received the following chemotherapy agents:  durvalumab (Imfinzi)  To help prevent nausea and vomiting after your treatment, we encourage you to take your nausea medication as prescribed.   If you develop nausea and vomiting that is not controlled by your nausea medication, call the clinic.   BELOW ARE SYMPTOMS THAT SHOULD BE REPORTED IMMEDIATELY:  *FEVER GREATER THAN 100.5 F  *CHILLS WITH OR WITHOUT FEVER  NAUSEA AND VOMITING THAT IS NOT CONTROLLED WITH YOUR NAUSEA MEDICATION  *UNUSUAL SHORTNESS OF BREATH  *UNUSUAL BRUISING OR BLEEDING  TENDERNESS IN MOUTH AND THROAT WITH OR WITHOUT PRESENCE OF ULCERS  *URINARY PROBLEMS  *BOWEL PROBLEMS  UNUSUAL RASH Items with * indicate a potential emergency and should be followed up as soon as possible.  Feel free to call the clinic should you have any questions or concerns. The clinic phone number is [phone].  Please show the [place] at check-in to the Emergency Department and triage nurse.

## 2018-12-28 NOTE — Progress Notes (Signed)
[facility] Telephone:(336) [phone]   Fax:(336) [phone]  OFFICE PROGRESS NOTE  [name], MD [place] [address]  DIAGNOSIS: Stage IIIB (T3,N3, M0)non-small cell lung cancer, squamous cell carcinoma presented with large right middle lobe lung breast in addition to mediastinal and bilateral hilar lymphadenopathy and suspicious right supraclavicular lymph node diagnosed in August 2019.   PRIOR THERAPY: A course of concurrent chemoradiation with weekly carboplatin for AUC of 2 and paclitaxel 45 MG/M2.  First dose of chemotherapy given on 05/29/2018.  Status post 5 cycles.  CURRENT THERAPY:  Consolidation treatment with immunotherapy with Imfinzi 10 mg/KG every 2 weeks.  First dose August 10, 2018.  Status post 10 cycles.  INTERVAL HISTORY: Jenna [name] 73 y.o. female returns to the clinic today for follow-up visit.  The patient is feeling fine today with no concerning complaints.  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  The patient is feeling well today except for mild fatigue.  She denied having any fever or chills.  She has no nausea, vomiting, diarrhea or constipation.  She denied having any headache or visual changes.  She has no weight loss or night sweats.  She is here today for evaluation before starting cycle #11 of her treatment.   MEDICAL HISTORY: Past Medical History:  Diagnosis Date  . Blood transfusion without reported diagnosis   . Cataract   . DM (diabetes mellitus) ([place])   . GERD (gastroesophageal reflux disease)   . Hyperlipidemia   . Hypertension   . Iron deficiency anemia   . NSCL ca dx'd 03/2018  . Thyroid disease     ALLERGIES:  is allergic to paclitaxel; aspirin; esomeprazole magnesium; and ace inhibitors.  MEDICATIONS:  Current Outpatient Medications  Medication Sig Dispense Refill  . dexlansoprazole (DEXILANT) 60 MG  capsule 1 BY MOUTH 30 MIN BEFORE BREAKFAST DAILY 90 capsule 0  . diphenhydrAMINE (BENADRYL) 2 % cream Apply 1 application topically as needed for itching.    . docusate sodium (COLACE) 100 MG capsule Take 100 mg by mouth daily as needed for mild constipation.    . ferrous sulfate 325 (65 FE) MG tablet Take 1 tablet (325 mg total) by mouth 2 (two) times daily with a meal. 180 tablet 1  . glipiZIDE (GLUCOTROL) 10 MG tablet Take 1 tablet (10 mg total) by mouth 2 (two) times daily before a meal. 180 tablet 1  . labetalol (NORMODYNE) 100 MG tablet Take 1 tablet (100 mg total) by mouth 2 (two) times daily. 180 tablet 1  . levothyroxine (SYNTHROID) 50 MCG tablet Take 1 tablet (50 mcg total) by mouth daily before breakfast. 30 tablet 2  . Multiple Vitamin (MULTIVITAMIN WITH MINERALS) TABS tablet Take 1 tablet by mouth at bedtime.     . prochlorperazine (COMPAZINE) 10 MG tablet Take 1 tablet (10 mg total) by mouth every 6 (six) hours as needed for nausea or vomiting. 30 tablet 0  . simvastatin (ZOCOR) 20 MG tablet Take 1 tablet (20 mg total) by mouth every evening. 90 tablet 1  . sucralfate (CARAFATE) 1 g tablet Take 1 tablet (1 g total) by mouth 4 (four) times daily. 120 tablet 2   No current facility-administered medications for this visit.    Facility-Administered Medications Ordered in Other Visits  Medication Dose Route Frequency Provider Last Rate Last Dose  . promethazine (PHENERGAN) injection 12.5 mg  12.5 mg  Intravenous Once Harle Stanford., PA-C      . Tbo-Filgrastim (GRANIX) injection 300 mcg  300 mcg Subcutaneous Once Curt Bears, MD        SURGICAL HISTORY:  Past Surgical History:  Procedure Laterality Date  . CATARACT EXTRACTION Right   . CHOLECYSTECTOMY    . COLONOSCOPY  10/24/2009   normal rectum/1X1cm abnormal lesion in the ascending colon (bx benign). TI normal for 10cm.  Prep difficult/inadequate. f/u TCS 09/2012 recommended  . COLONOSCOPY  10/13/2004   Normal  rectum/Diminutive polyps, splenic flexure, cold biopsied/removed.  Remainder of colonic mucosa appeared normal.  . COLONOSCOPY N/A 12/14/2012   VZD:GLOVFIE polyp-tubular adenoma  . ESOPHAGOGASTRODUODENOSCOPY  10/13/2004    Normal esophagus/ Nodular volcano like lesion in the antrum, either representing a  pancreatic rest or leiomyoma, biopsied.  Remainder of the gastric mucosa appeared normal, normal D1-D2  . ESOPHAGOGASTRODUODENOSCOPY  10/24/2009   Benign biopsies. normal esophagus/small hiatal hernia/nodular lesion antrum/distal greater curvature. duodenal AVM s/p ablation  . GIVENS CAPSULE STUDY  07/27/2010    multiple arteriovenous malformations which could definitely be the contributor to her drifting hemoglobin and hematocrit  . TUBAL LIGATION    . VIDEO BRONCHOSCOPY WITH ENDOBRONCHIAL ULTRASOUND N/A 04/27/2018   Procedure: VIDEO BRONCHOSCOPY WITH ENDOBRONCHIAL ULTRASOUND;  Surgeon: Melrose Nakayama, MD;  Location: [facility] OR;  Service: Thoracic;  Laterality: N/A;    REVIEW OF SYSTEMS:  A comprehensive review of systems was negative except for: Constitutional: positive for fatigue   PHYSICAL EXAMINATION: General appearance: alert, cooperative, fatigued and no distress Head: Normocephalic, without obvious abnormality, atraumatic Neck: no adenopathy, no JVD, supple, symmetrical, trachea midline and thyroid not enlarged, symmetric, no tenderness/mass/nodules Lymph nodes: Cervical, supraclavicular, and axillary nodes normal. Resp: clear to auscultation bilaterally Back: symmetric, no curvature. ROM normal. No CVA tenderness. Cardio: regular rate and rhythm, S1, S2 normal, no murmur, click, rub or gallop GI: soft, non-tender; bowel sounds normal; no masses,  no organomegaly Extremities: extremities normal, atraumatic, no cyanosis or edema  ECOG PERFORMANCE STATUS: 1 - Symptomatic but completely ambulatory  Blood pressure (!) 154/53, pulse 87, temperature 98.7 F (37.1 C), temperature source  Oral, resp. rate 20, height 5\' 5"  (1.651 m), weight 167 lb 14.4 oz (76.2 kg), SpO2 100 %.  LABORATORY DATA: Lab Results  Component Value Date   WBC 4.4 12/28/2018   HGB 8.5 (L) 12/28/2018   HCT 28.7 (L) 12/28/2018   MCV 93.8 12/28/2018   PLT 185 12/28/2018      Chemistry      Component Value Date/Time   NA 137 12/14/2018 0943   K 4.0 12/14/2018 0943   CL 105 12/14/2018 0943   CO2 22 12/14/2018 0943   BUN 16 12/14/2018 0943   BUN 12 12/10/2011 0919   CREATININE 1.07 (H) 12/14/2018 0943   CREATININE 0.72 12/10/2011 0919      Component Value Date/Time   CALCIUM 8.8 (L) 12/14/2018 0943   ALKPHOS 102 12/14/2018 0943   ALKPHOS 88 12/10/2011 0919   AST 27 12/14/2018 0943   ALT 21 12/14/2018 0943   BILITOT <0.2 (L) 12/14/2018 0943       RADIOGRAPHIC STUDIES: No results found.  ASSESSMENT AND PLAN: This is a very pleasant 73 years old African-American female recently diagnosed with a stage IIIB non-small cell lung cancer, squamous cell carcinoma.  She completed a course of concurrent chemoradiation with weekly carboplatin and paclitaxel status post 5 cycles with partial response.  She tolerated this treatment well except for the pancytopenia  and fatigue.  The patient is currently undergoing treatment with consolidation immunotherapy with Imfinzi status post 10 cycles. The patient continues to tolerate this treatment well with no concerning adverse effects. I recommended for her to proceed with cycle #11 today as planned. For the iron deficiency anemia, she will continue on the oral iron tablets for now. She will come back for follow-up visit in 2 weeks for evaluation before starting cycle #12. The patient was advised to call immediately if she has any concerning symptoms in the interval. The patient voices understanding of current disease status and treatment options and is in agreement with the current care plan. All questions were answered. The patient knows to call the clinic  with any problems, questions or concerns. We can certainly see the patient much sooner if necessary.  Disclaimer: This note was dictated with voice recognition software. Similar sounding words can inadvertently be transcribed and may not be corrected upon review.

## 2018-12-29 ENCOUNTER — Telehealth: Payer: Self-pay | Admitting: Internal Medicine

## 2018-12-29 NOTE — Telephone Encounter (Signed)
Scheduled appt per 5/14 los - pt to get an updated schedule next visit.

## 2019-01-11 ENCOUNTER — Other Ambulatory Visit: Payer: Self-pay

## 2019-01-11 ENCOUNTER — Inpatient Hospital Stay: Payer: Medicare Other

## 2019-01-11 ENCOUNTER — Inpatient Hospital Stay (HOSPITAL_BASED_OUTPATIENT_CLINIC_OR_DEPARTMENT_OTHER): Payer: Medicare Other | Admitting: Internal Medicine

## 2019-01-11 ENCOUNTER — Encounter: Payer: Self-pay | Admitting: Internal Medicine

## 2019-01-11 VITALS — BP 136/58 | HR 87 | Temp 98.9°F | Resp 18 | Ht 65.0 in | Wt 166.5 lb

## 2019-01-11 DIAGNOSIS — R5383 Other fatigue: Secondary | ICD-10-CM

## 2019-01-11 DIAGNOSIS — C342 Malignant neoplasm of middle lobe, bronchus or lung: Secondary | ICD-10-CM | POA: Diagnosis not present

## 2019-01-11 DIAGNOSIS — Z79899 Other long term (current) drug therapy: Secondary | ICD-10-CM | POA: Diagnosis not present

## 2019-01-11 DIAGNOSIS — Z5112 Encounter for antineoplastic immunotherapy: Secondary | ICD-10-CM

## 2019-01-11 DIAGNOSIS — R5382 Chronic fatigue, unspecified: Secondary | ICD-10-CM

## 2019-01-11 DIAGNOSIS — C3490 Malignant neoplasm of unspecified part of unspecified bronchus or lung: Secondary | ICD-10-CM

## 2019-01-11 DIAGNOSIS — D509 Iron deficiency anemia, unspecified: Secondary | ICD-10-CM | POA: Diagnosis not present

## 2019-01-11 DIAGNOSIS — Q2730 Arteriovenous malformation, site unspecified: Secondary | ICD-10-CM

## 2019-01-11 LAB — CBC WITH DIFFERENTIAL (CANCER CENTER ONLY)
Abs Immature Granulocytes: 0.01 10*3/uL (ref 0.00–0.07)
Basophils Absolute: 0 10*3/uL (ref 0.0–0.1)
Basophils Relative: 0 %
Eosinophils Absolute: 0.1 10*3/uL (ref 0.0–0.5)
Eosinophils Relative: 2 %
HCT: 28.3 % — ABNORMAL LOW (ref 36.0–46.0)
Hemoglobin: 8.2 g/dL — ABNORMAL LOW (ref 12.0–15.0)
Immature Granulocytes: 0 %
Lymphocytes Relative: 7 %
Lymphs Abs: 0.3 10*3/uL — ABNORMAL LOW (ref 0.7–4.0)
MCH: 28.3 pg (ref 26.0–34.0)
MCHC: 29 g/dL — ABNORMAL LOW (ref 30.0–36.0)
MCV: 97.6 fL (ref 80.0–100.0)
Monocytes Absolute: 0.2 10*3/uL (ref 0.1–1.0)
Monocytes Relative: 6 %
Neutro Abs: 3.2 10*3/uL (ref 1.7–7.7)
Neutrophils Relative %: 85 %
Platelet Count: 208 10*3/uL (ref 150–400)
RBC: 2.9 MIL/uL — ABNORMAL LOW (ref 3.87–5.11)
RDW: 16.6 % — ABNORMAL HIGH (ref 11.5–15.5)
WBC Count: 3.8 10*3/uL — ABNORMAL LOW (ref 4.0–10.5)
nRBC: 0 % (ref 0.0–0.2)

## 2019-01-11 LAB — CMP (CANCER CENTER ONLY)
ALT: 20 U/L (ref 0–44)
AST: 24 U/L (ref 15–41)
Albumin: 3.5 g/dL (ref 3.5–5.0)
Alkaline Phosphatase: 102 U/L (ref 38–126)
Anion gap: 7 (ref 5–15)
BUN: 19 mg/dL (ref 8–23)
CO2: 24 mmol/L (ref 22–32)
Calcium: 8.9 mg/dL (ref 8.9–10.3)
Chloride: 106 mmol/L (ref 98–111)
Creatinine: 1.05 mg/dL — ABNORMAL HIGH (ref 0.44–1.00)
GFR, Est AFR Am: >60 mL/min (ref >60)
GFR, Estimated: 53 mL/min — ABNORMAL LOW (ref >60)
Glucose, Bld: 215 mg/dL — ABNORMAL HIGH (ref 70–99)
Potassium: 4.3 mmol/L (ref 3.5–5.1)
Sodium: 137 mmol/L (ref 135–145)
Total Bilirubin: 0.2 mg/dL — ABNORMAL LOW (ref 0.3–1.2)
Total Protein: 7.9 g/dL (ref 6.5–8.1)

## 2019-01-11 LAB — TSH: TSH: 16.872 u[IU]/mL — ABNORMAL HIGH (ref 0.308–3.960)

## 2019-01-11 MED ORDER — HEPARIN SOD (PORK) LOCK FLUSH 100 UNIT/ML IV SOLN
500.0000 [IU] | Freq: Once | INTRAVENOUS | Status: DC | PRN
  Filled 2019-01-11: qty 5

## 2019-01-11 MED ORDER — SODIUM CHLORIDE 0.9% FLUSH
10.0000 mL | INTRAVENOUS | Status: DC | PRN
  Filled 2019-01-11: qty 10

## 2019-01-11 MED ORDER — SODIUM CHLORIDE 0.9 % IV SOLN
740.0000 mg | Freq: Once | INTRAVENOUS | Status: AC
  Administered 2019-01-11: 13:00:00 740 mg via INTRAVENOUS
  Filled 2019-01-11: qty 10

## 2019-01-11 MED ORDER — SODIUM CHLORIDE 0.9 % IV SOLN
Freq: Once | INTRAVENOUS | Status: AC
  Administered 2019-01-11: 13:00:00 via INTRAVENOUS
  Filled 2019-01-11: qty 250

## 2019-01-11 NOTE — Patient Instructions (Signed)
[facility] Discharge Instructions for Patients Receiving Chemotherapy  Today you received the following chemotherapy agents: Imfinzi.  To help prevent nausea and vomiting after your treatment, we encourage you to take your nausea medication as directed.   If you develop nausea and vomiting that is not controlled by your nausea medication, call the clinic.   BELOW ARE SYMPTOMS THAT SHOULD BE REPORTED IMMEDIATELY:  *FEVER GREATER THAN 100.5 F  *CHILLS WITH OR WITHOUT FEVER  NAUSEA AND VOMITING THAT IS NOT CONTROLLED WITH YOUR NAUSEA MEDICATION  *UNUSUAL SHORTNESS OF BREATH  *UNUSUAL BRUISING OR BLEEDING  TENDERNESS IN MOUTH AND THROAT WITH OR WITHOUT PRESENCE OF ULCERS  *URINARY PROBLEMS  *BOWEL PROBLEMS  UNUSUAL RASH Items with * indicate a potential emergency and should be followed up as soon as possible.  Feel free to call the clinic should you have any questions or concerns. The clinic phone number is [phone].  Please show the CHEMO ALERT CARD at check-in to the Emergency Department and triage nurse.   

## 2019-01-11 NOTE — Progress Notes (Signed)
[facility] Telephone:([phone]   Fax:(336) [phone]  OFFICE PROGRESS NOTE  [name], MD [place] [address]  DIAGNOSIS: Stage IIIB (T3,N3, M0)non-small cell lung cancer, squamous cell carcinoma presented with large right middle lobe lung breast in addition to mediastinal and bilateral hilar lymphadenopathy and suspicious right supraclavicular lymph node diagnosed in August 2019.   PRIOR THERAPY: A course of concurrent chemoradiation with weekly carboplatin for AUC of 2 and paclitaxel 45 MG/M2.  First dose of chemotherapy given on 05/29/2018.  Status post 5 cycles.  CURRENT THERAPY:  Consolidation treatment with immunotherapy with Imfinzi 10 mg/KG every 2 weeks.  First dose August 10, 2018.  Status post 11 cycles.  INTERVAL HISTORY: Jenna [name] 73 y.o. female returns to the clinic today for follow-up visit.  The patient is feeling fine today with no concerning complaints except for mild fatigue.  She denied having any chest pain, shortness of breath, cough or hemoptysis.  She denied having any fever or chills.  She has no nausea, vomiting, diarrhea or constipation.  She has been tolerating her treatment with consolidation immunotherapy fairly well.  She is here for evaluation before starting cycle #12.   MEDICAL HISTORY: Past Medical History:  Diagnosis Date  . Blood transfusion without reported diagnosis   . Cataract   . DM (diabetes mellitus) ([place])   . GERD (gastroesophageal reflux disease)   . Hyperlipidemia   . Hypertension   . Iron deficiency anemia   . NSCL ca dx'd 03/2018  . Thyroid disease     ALLERGIES:  is allergic to paclitaxel; aspirin; esomeprazole magnesium; and ace inhibitors.  MEDICATIONS:  Current Outpatient Medications  Medication Sig Dispense Refill  . dexlansoprazole (DEXILANT) 60 MG capsule 1 BY MOUTH 30 MIN BEFORE BREAKFAST DAILY 90 capsule 0  . diphenhydrAMINE (BENADRYL) 2 % cream Apply 1  application topically as needed for itching.    . docusate sodium (COLACE) 100 MG capsule Take 100 mg by mouth daily as needed for mild constipation.    . ferrous sulfate 325 (65 FE) MG tablet Take 1 tablet (325 mg total) by mouth 2 (two) times daily with a meal. 180 tablet 1  . glipiZIDE (GLUCOTROL) 10 MG tablet Take 1 tablet (10 mg total) by mouth 2 (two) times daily before a meal. 180 tablet 1  . labetalol (NORMODYNE) 100 MG tablet Take 1 tablet (100 mg total) by mouth 2 (two) times daily. 180 tablet 1  . levothyroxine (SYNTHROID) 50 MCG tablet Take 1 tablet (50 mcg total) by mouth daily before breakfast. 30 tablet 2  . Multiple Vitamin (MULTIVITAMIN WITH MINERALS) TABS tablet Take 1 tablet by mouth at bedtime.     . prochlorperazine (COMPAZINE) 10 MG tablet Take 1 tablet (10 mg total) by mouth every 6 (six) hours as needed for nausea or vomiting. 30 tablet 0  . simvastatin (ZOCOR) 20 MG tablet Take 1 tablet (20 mg total) by mouth every evening. 90 tablet 1  . sucralfate (CARAFATE) 1 g tablet Take 1 tablet (1 g total) by mouth 4 (four) times daily. 120 tablet 2   No current facility-administered medications for this visit.    Facility-Administered Medications Ordered in Other Visits  Medication Dose Route Frequency Provider Last Rate Last Dose  . promethazine (PHENERGAN) injection 12.5 mg  12.5 mg Intravenous Once Harle Stanford., PA-C      . Tbo-Filgrastim (GRANIX) injection 300 mcg  300 mcg Subcutaneous Once [place],  Julien Nordmann, MD        SURGICAL HISTORY:  Past Surgical History:  Procedure Laterality Date  . CATARACT EXTRACTION Right   . CHOLECYSTECTOMY    . COLONOSCOPY  10/24/2009   normal rectum/1X1cm abnormal lesion in the ascending colon (bx benign). TI normal for 10cm.  Prep difficult/inadequate. f/u TCS 09/2012 recommended  . COLONOSCOPY  10/13/2004   Normal rectum/Diminutive polyps, splenic flexure, cold biopsied/removed.  Remainder of colonic mucosa appeared normal.  . COLONOSCOPY  N/A 12/14/2012   CVE:LFYBOFB polyp-tubular adenoma  . ESOPHAGOGASTRODUODENOSCOPY  10/13/2004    Normal esophagus/ Nodular volcano like lesion in the antrum, either representing a  pancreatic rest or leiomyoma, biopsied.  Remainder of the gastric mucosa appeared normal, normal D1-D2  . ESOPHAGOGASTRODUODENOSCOPY  10/24/2009   Benign biopsies. normal esophagus/small hiatal hernia/nodular lesion antrum/distal greater curvature. duodenal AVM s/p ablation  . GIVENS CAPSULE STUDY  07/27/2010    multiple arteriovenous malformations which could definitely be the contributor to her drifting hemoglobin and hematocrit  . TUBAL LIGATION    . VIDEO BRONCHOSCOPY WITH ENDOBRONCHIAL ULTRASOUND N/A 04/27/2018   Procedure: VIDEO BRONCHOSCOPY WITH ENDOBRONCHIAL ULTRASOUND;  Surgeon: Melrose Nakayama, MD;  Location: [facility] OR;  Service: Thoracic;  Laterality: N/A;    REVIEW OF SYSTEMS:  A comprehensive review of systems was negative except for: Constitutional: positive for fatigue   PHYSICAL EXAMINATION: General appearance: alert, cooperative, fatigued and no distress Head: Normocephalic, without obvious abnormality, atraumatic Neck: no adenopathy, no JVD, supple, symmetrical, trachea midline and thyroid not enlarged, symmetric, no tenderness/mass/nodules Lymph nodes: Cervical, supraclavicular, and axillary nodes normal. Resp: clear to auscultation bilaterally Back: symmetric, no curvature. ROM normal. No CVA tenderness. Cardio: regular rate and rhythm, S1, S2 normal, no murmur, click, rub or gallop GI: soft, non-tender; bowel sounds normal; no masses,  no organomegaly Extremities: extremities normal, atraumatic, no cyanosis or edema  ECOG PERFORMANCE STATUS: 1 - Symptomatic but completely ambulatory  Blood pressure (!) 136/58, pulse 87, temperature 98.9 F (37.2 C), temperature source Oral, resp. rate 18, height 5\' 5"  (1.651 m), weight 166 lb 8 oz (75.5 kg), SpO2 99 %.  LABORATORY DATA: Lab Results   Component Value Date   WBC 3.8 (L) 01/11/2019   HGB 8.2 (L) 01/11/2019   HCT 28.3 (L) 01/11/2019   MCV 97.6 01/11/2019   PLT 208 01/11/2019      Chemistry      Component Value Date/Time   NA 142 12/28/2018 0803   K 4.3 12/28/2018 0803   CL 108 12/28/2018 0803   CO2 24 12/28/2018 0803   BUN 20 12/28/2018 0803   BUN 12 12/10/2011 0919   CREATININE 1.10 (H) 12/28/2018 0803   CREATININE 0.72 12/10/2011 0919      Component Value Date/Time   CALCIUM 9.4 12/28/2018 0803   ALKPHOS 101 12/28/2018 0803   ALKPHOS 88 12/10/2011 0919   AST 19 12/28/2018 0803   ALT 17 12/28/2018 0803   BILITOT <0.2 (L) 12/28/2018 0803       RADIOGRAPHIC STUDIES: No results found.  ASSESSMENT AND PLAN: This is a very pleasant 73 years old African-American female recently diagnosed with a stage IIIB non-small cell lung cancer, squamous cell carcinoma.  She completed a course of concurrent chemoradiation with weekly carboplatin and paclitaxel status post 5 cycles with partial response.  She tolerated this treatment well except for the pancytopenia and fatigue.  The patient is currently undergoing treatment with consolidation immunotherapy with Imfinzi status post 11 cycles. The patient has been tolerating  this treatment well with no concerning adverse effects. I recommended for her to proceed with cycle #12 today as planned. I will see her back for follow-up visit in 2 weeks for evaluation after repeating CT scan of the chest for restaging of her disease. For the iron deficiency anemia, she will continue with the oral iron tablets for now. She was advised to call immediately if she has any concerning symptoms in the interval. The patient voices understanding of current disease status and treatment options and is in agreement with the current care plan. All questions were answered. The patient knows to call the clinic with any problems, questions or concerns. We can certainly see the patient much sooner if  necessary.  Disclaimer: This note was dictated with voice recognition software. Similar sounding words can inadvertently be transcribed and may not be corrected upon review.

## 2019-01-15 ENCOUNTER — Other Ambulatory Visit: Payer: Self-pay | Admitting: Family Medicine

## 2019-01-23 ENCOUNTER — Ambulatory Visit (HOSPITAL_COMMUNITY)
Admission: RE | Admit: 2019-01-23 | Discharge: 2019-01-23 | Disposition: A | Discharge status: Home / Self Care | Payer: Medicare Other | Source: Ambulatory Visit | Attending: Internal Medicine | Admitting: Internal Medicine

## 2019-01-23 ENCOUNTER — Other Ambulatory Visit: Payer: Self-pay

## 2019-01-23 ENCOUNTER — Encounter (HOSPITAL_COMMUNITY): Payer: Self-pay

## 2019-01-23 DIAGNOSIS — R590 Localized enlarged lymph nodes: Secondary | ICD-10-CM | POA: Diagnosis not present

## 2019-01-23 DIAGNOSIS — C3490 Malignant neoplasm of unspecified part of unspecified bronchus or lung: Secondary | ICD-10-CM | POA: Diagnosis not present

## 2019-01-23 MED ORDER — IOHEXOL 300 MG/ML  SOLN
75.0000 mL | Freq: Once | INTRAMUSCULAR | Status: AC | PRN
  Administered 2019-01-23: 75 mL via INTRAVENOUS

## 2019-01-23 MED ORDER — SODIUM CHLORIDE (PF) 0.9 % IJ SOLN
INTRAMUSCULAR | Status: AC
  Filled 2019-01-23: qty 50

## 2019-01-24 ENCOUNTER — Telehealth: Payer: Self-pay | Admitting: Family Medicine

## 2019-01-24 NOTE — Telephone Encounter (Signed)
I contacted patient to set up her transportation for 6/25 at 1PM and 7/9 at 08:30AM.

## 2019-01-25 ENCOUNTER — Inpatient Hospital Stay: Payer: Medicare Other | Attending: Internal Medicine

## 2019-01-25 ENCOUNTER — Encounter: Payer: Self-pay | Admitting: Internal Medicine

## 2019-01-25 ENCOUNTER — Other Ambulatory Visit: Payer: Self-pay

## 2019-01-25 ENCOUNTER — Inpatient Hospital Stay: Payer: Medicare Other

## 2019-01-25 ENCOUNTER — Telehealth: Payer: Self-pay | Admitting: Internal Medicine

## 2019-01-25 ENCOUNTER — Inpatient Hospital Stay (HOSPITAL_BASED_OUTPATIENT_CLINIC_OR_DEPARTMENT_OTHER): Payer: Medicare Other | Admitting: Internal Medicine

## 2019-01-25 VITALS — BP 144/46 | HR 88 | Temp 98.8°F | Resp 20 | Ht 65.0 in | Wt 168.7 lb

## 2019-01-25 DIAGNOSIS — Z5112 Encounter for antineoplastic immunotherapy: Secondary | ICD-10-CM

## 2019-01-25 DIAGNOSIS — C342 Malignant neoplasm of middle lobe, bronchus or lung: Secondary | ICD-10-CM

## 2019-01-25 DIAGNOSIS — Z79899 Other long term (current) drug therapy: Secondary | ICD-10-CM | POA: Diagnosis not present

## 2019-01-25 DIAGNOSIS — E1165 Type 2 diabetes mellitus with hyperglycemia: Secondary | ICD-10-CM | POA: Insufficient documentation

## 2019-01-25 DIAGNOSIS — R599 Enlarged lymph nodes, unspecified: Secondary | ICD-10-CM | POA: Diagnosis not present

## 2019-01-25 DIAGNOSIS — D649 Anemia, unspecified: Secondary | ICD-10-CM

## 2019-01-25 DIAGNOSIS — R0602 Shortness of breath: Secondary | ICD-10-CM | POA: Diagnosis not present

## 2019-01-25 DIAGNOSIS — I10 Essential (primary) hypertension: Secondary | ICD-10-CM

## 2019-01-25 DIAGNOSIS — D500 Iron deficiency anemia secondary to blood loss (chronic): Secondary | ICD-10-CM

## 2019-01-25 LAB — CMP (CANCER CENTER ONLY)
ALT: 15 U/L (ref 0–44)
AST: 18 U/L (ref 15–41)
Albumin: 3.6 g/dL (ref 3.5–5.0)
Alkaline Phosphatase: 107 U/L (ref 38–126)
Anion gap: 11 (ref 5–15)
BUN: 24 mg/dL — ABNORMAL HIGH (ref 8–23)
CO2: 20 mmol/L — ABNORMAL LOW (ref 22–32)
Calcium: 9.1 mg/dL (ref 8.9–10.3)
Chloride: 106 mmol/L (ref 98–111)
Creatinine: 1.2 mg/dL — ABNORMAL HIGH (ref 0.44–1.00)
GFR, Est AFR Am: 52 mL/min — ABNORMAL LOW (ref >60)
GFR, Estimated: 45 mL/min — ABNORMAL LOW (ref >60)
Glucose, Bld: 248 mg/dL — ABNORMAL HIGH (ref 70–99)
Potassium: 4.5 mmol/L (ref 3.5–5.1)
Sodium: 137 mmol/L (ref 135–145)
Total Bilirubin: <0.2 mg/dL — ABNORMAL LOW (ref 0.3–1.2)
Total Protein: 7.8 g/dL (ref 6.5–8.1)

## 2019-01-25 LAB — CBC WITH DIFFERENTIAL (CANCER CENTER ONLY)
Abs Immature Granulocytes: 0.04 10*3/uL (ref 0.00–0.07)
Basophils Absolute: 0 10*3/uL (ref 0.0–0.1)
Basophils Relative: 1 %
Eosinophils Absolute: 0.1 10*3/uL (ref 0.0–0.5)
Eosinophils Relative: 2 %
HCT: 30.1 % — ABNORMAL LOW (ref 36.0–46.0)
Hemoglobin: 8.9 g/dL — ABNORMAL LOW (ref 12.0–15.0)
Immature Granulocytes: 1 %
Lymphocytes Relative: 7 %
Lymphs Abs: 0.3 10*3/uL — ABNORMAL LOW (ref 0.7–4.0)
MCH: 28.3 pg (ref 26.0–34.0)
MCHC: 29.6 g/dL — ABNORMAL LOW (ref 30.0–36.0)
MCV: 95.9 fL (ref 80.0–100.0)
Monocytes Absolute: 0.2 10*3/uL (ref 0.1–1.0)
Monocytes Relative: 6 %
Neutro Abs: 3.1 10*3/uL (ref 1.7–7.7)
Neutrophils Relative %: 83 %
Platelet Count: 219 10*3/uL (ref 150–400)
RBC: 3.14 MIL/uL — ABNORMAL LOW (ref 3.87–5.11)
RDW: 16 % — ABNORMAL HIGH (ref 11.5–15.5)
WBC Count: 3.8 10*3/uL — ABNORMAL LOW (ref 4.0–10.5)
nRBC: 0 % (ref 0.0–0.2)

## 2019-01-25 MED ORDER — SODIUM CHLORIDE 0.9 % IV SOLN
740.0000 mg | Freq: Once | INTRAVENOUS | Status: AC
  Administered 2019-01-25: 10:00:00 740 mg via INTRAVENOUS
  Filled 2019-01-25: qty 10

## 2019-01-25 MED ORDER — SODIUM CHLORIDE 0.9 % IV SOLN
Freq: Once | INTRAVENOUS | Status: AC
  Administered 2019-01-25: 10:00:00 via INTRAVENOUS
  Filled 2019-01-25: qty 250

## 2019-01-25 NOTE — Telephone Encounter (Signed)
Added additional cycles per 6/11 los - pt to get an updated schedule next visit.

## 2019-01-25 NOTE — Progress Notes (Signed)
[name] [phone]   [phone]) [phone]  OFFICE PROGRESS NOTE  [name], MD [place] [address]  DIAGNOSIS: Stage IIIB (T3,N3, M0)non-small cell lung cancer, squamous cell carcinoma presented with large right middle lobe lung breast in addition to mediastinal and bilateral hilar lymphadenopathy and suspicious right supraclavicular lymph node diagnosed in August 2019.   PRIOR THERAPY: A course of concurrent chemoradiation with weekly carboplatin for AUC of 2 and paclitaxel 45 MG/M2.  First dose of chemotherapy given on 05/29/2018.  Status post 5 cycles.  CURRENT THERAPY:  Consolidation treatment with immunotherapy with Imfinzi 10 mg/KG every 2 weeks.  First dose August 10, 2018.  Status post 12 cycles.  INTERVAL HISTORY: Jenna [name] 73 y.o. female returns to the clinic today for follow-up visit.  The patient is feeling fine today with no concerning complaints except for shortness of breath.  She denied having any chest pain, cough or hemoptysis.  She denied having any fever or chills.  She has no nausea, vomiting, diarrhea or constipation.  She denied having any headache or visual changes.  She has no weight loss or night sweats.  She continues to tolerate her treatment with Imfinzi fairly well.  The patient is here today for evaluation with repeat CT scan of the chest for restaging of her disease.   MEDICAL HISTORY: Past Medical History:  Diagnosis Date  . Blood transfusion without reported diagnosis   . Cataract   . DM (diabetes mellitus) ([place])   . GERD (gastroesophageal reflux disease)   . Hyperlipidemia   . Hypertension   . Iron deficiency anemia   . NSCL ca dx'd 03/2018  . Thyroid disease     ALLERGIES:  is allergic to paclitaxel; aspirin; esomeprazole magnesium; and ace inhibitors.  MEDICATIONS:  Current Outpatient Medications  Medication Sig Dispense Refill  . dexlansoprazole (DEXILANT) 60 MG  capsule Take 1qam ac 90 capsule 3  . diphenhydrAMINE (BENADRYL) 2 % cream Apply 1 application topically as needed for itching.    . docusate sodium (COLACE) 100 MG capsule Take 100 mg by mouth daily as needed for mild constipation.    . ferrous sulfate 325 (65 FE) MG tablet Take 1 tablet (325 mg total) by mouth 2 (two) times daily with a meal. 180 tablet 1  . glipiZIDE (GLUCOTROL) 10 MG tablet Take 1 tablet (10 mg total) by mouth 2 (two) times daily before a meal. 180 tablet 1  . labetalol (NORMODYNE) 100 MG tablet Take 1 tablet (100 mg total) by mouth 2 (two) times daily. 180 tablet 1  . levothyroxine (SYNTHROID) 50 MCG tablet Take 1 tablet (50 mcg total) by mouth daily before breakfast. 30 tablet 2  . Multiple Vitamin (MULTIVITAMIN WITH MINERALS) TABS tablet Take 1 tablet by mouth at bedtime.     . prochlorperazine (COMPAZINE) 10 MG tablet Take 1 tablet (10 mg total) by mouth every 6 (six) hours as needed for nausea or vomiting. 30 tablet 0  . simvastatin (ZOCOR) 20 MG tablet Take 1 tablet (20 mg total) by mouth every evening. 90 tablet 1  . sucralfate (CARAFATE) 1 g tablet Take 1 tablet (1 g total) by mouth 4 (four) times daily. 120 tablet 2   No current facility-administered medications for this visit.    Facility-Administered Medications Ordered in Other Visits  Medication Dose Route Frequency Provider Last Rate Last Dose  . promethazine (PHENERGAN) injection 12.5 mg  12.5 mg Intravenous Once Tanner,  Lyndon Code., PA-C      . Tbo-Filgrastim (GRANIX) injection 300 mcg  300 mcg Subcutaneous Once Curt Bears, MD        SURGICAL HISTORY:  Past Surgical History:  Procedure Laterality Date  . CATARACT EXTRACTION Right   . CHOLECYSTECTOMY    . COLONOSCOPY  10/24/2009   normal rectum/1X1cm abnormal lesion in the ascending colon (bx benign). TI normal for 10cm.  Prep difficult/inadequate. f/u TCS 09/2012 recommended  . COLONOSCOPY  10/13/2004   Normal rectum/Diminutive polyps, splenic flexure,  cold biopsied/removed.  Remainder of colonic mucosa appeared normal.  . COLONOSCOPY N/A 12/14/2012   ZOX:WRUEAVW polyp-tubular adenoma  . ESOPHAGOGASTRODUODENOSCOPY  10/13/2004    Normal esophagus/ Nodular volcano like lesion in the antrum, either representing a  pancreatic rest or leiomyoma, biopsied.  Remainder of the gastric mucosa appeared normal, normal D1-D2  . ESOPHAGOGASTRODUODENOSCOPY  10/24/2009   Benign biopsies. normal esophagus/small hiatal hernia/nodular lesion antrum/distal greater curvature. duodenal AVM s/p ablation  . GIVENS CAPSULE STUDY  07/27/2010    multiple arteriovenous malformations which could definitely be the contributor to her drifting hemoglobin and hematocrit  . TUBAL LIGATION    . VIDEO BRONCHOSCOPY WITH ENDOBRONCHIAL ULTRASOUND N/A 04/27/2018   Procedure: VIDEO BRONCHOSCOPY WITH ENDOBRONCHIAL ULTRASOUND;  Surgeon: Melrose Nakayama, MD;  Location: [facility];  Service: Thoracic;  Laterality: N/A;    REVIEW OF SYSTEMS:  Constitutional: positive for fatigue Eyes: negative Ears, nose, mouth, throat, and face: negative Respiratory: positive for dyspnea on exertion Cardiovascular: negative Gastrointestinal: negative Genitourinary:negative Integument/breast: negative Hematologic/lymphatic: negative Musculoskeletal:negative Neurological: negative Behavioral/Psych: negative Endocrine: negative Allergic/Immunologic: negative   PHYSICAL EXAMINATION: General appearance: alert, cooperative, fatigued and no distress Head: Normocephalic, without obvious abnormality, atraumatic Neck: no adenopathy, no JVD, supple, symmetrical, trachea midline and thyroid not enlarged, symmetric, no tenderness/mass/nodules Lymph nodes: Cervical, supraclavicular, and axillary nodes normal. Resp: clear to auscultation bilaterally Back: symmetric, no curvature. ROM normal. No CVA tenderness. Cardio: regular rate and rhythm, S1, S2 normal, no murmur, click, rub or gallop GI: soft,  non-tender; bowel sounds normal; no masses,  no organomegaly Extremities: extremities normal, atraumatic, no cyanosis or edema Neurologic: Alert and oriented X 3, normal strength and tone. Normal symmetric reflexes. Normal coordination and gait  ECOG PERFORMANCE STATUS: 1 - Symptomatic but completely ambulatory  Blood pressure (!) 144/46, pulse 88, temperature 98.8 F (37.1 C), temperature source Oral, resp. rate 20, height 5\' 5"  (1.651 m), weight 168 lb 11.2 oz (76.5 kg).  LABORATORY DATA: Lab Results  Component Value Date   WBC 3.8 (L) 01/25/2019   HGB 8.9 (L) 01/25/2019   HCT 30.1 (L) 01/25/2019   MCV 95.9 01/25/2019   PLT 219 01/25/2019      Chemistry      Component Value Date/Time   NA 137 01/11/2019 1112   K 4.3 01/11/2019 1112   CL 106 01/11/2019 1112   CO2 24 01/11/2019 1112   BUN 19 01/11/2019 1112   BUN 12 12/10/2011 0919   CREATININE 1.05 (H) 01/11/2019 1112   CREATININE 0.72 12/10/2011 0919      Component Value Date/Time   CALCIUM 8.9 01/11/2019 1112   ALKPHOS 102 01/11/2019 1112   ALKPHOS 88 12/10/2011 0919   AST 24 01/11/2019 1112   ALT 20 01/11/2019 1112   BILITOT 0.2 (L) 01/11/2019 1112       RADIOGRAPHIC STUDIES: Ct Chest W Contrast  Result Date: 01/23/2019 CLINICAL DATA:  Stage IIIB non-small cell lung cancer.  Follow-up. EXAM: CT CHEST WITH CONTRAST TECHNIQUE:  Multidetector CT imaging of the chest was performed during intravenous contrast administration. CONTRAST:  13mL OMNIPAQUE IOHEXOL 300 MG/ML  SOLN COMPARISON:  10/31/2018 FINDINGS: Cardiovascular: The heart size is normal. No pericardial effusion. Aortic atherosclerosis. Lad, left circumflex and RCA coronary artery calcifications. Mediastinum/Nodes: Normal appearance of the thyroid gland. The trachea appears patent and is midline. Normal appearance of the esophagus. High right paratracheal, low-density lymph node measures 2 cm, image 29/2. Previously 2.6 cm. Subcarinal lymph node measures 0.9 cm,  image 63/2. Previously 1.4 cm. Right hilar lymph node measures 1.7 cm, image 56/2. Previously 2.2 cm. Lungs/Pleura: Small right pleural effusion is decreased in volume from previous exam. Within the right midlung there are diffuse reticular and ground-glass opacities compatible with changes secondary to external beam radiation. Improving right middle lobe atelectasis. The underlying primary lung lesion is now visible measuring 2.4 x 2.1 cm, image 67/7. On the exam from 04/06/2018 this measured 4.5 x 3.9 cm. Patchy tree-in-bud and ground-glass nodules are identified within the left lower lobe, similar to previous exam, likely postinflammatory or infectious. Upper Abdomen: No acute abnormality. Musculoskeletal: Spondylosis within the thoracic spine. No aggressive lytic or sclerotic bone lesions. Fracture involving the mid body of sternum is again noted. This is unchanged from 10/31/2018. On CT from 05/01/18 there was a corresponding mixed lytic and sclerotic lesion. IMPRESSION: 1. There is been continued interval improved aeration to the right middle lobe with decreased postobstructive atelectasis. With the improved aeration the underlying lung lesion is now visible and appears decreased in size from 04/06/2018. 2. Improvement in mediastinal, subcarinal and right hilar adenopathy. 3. Extensive ground-glass and reticular opacities within the right midlung likely reflecting changes due to external beam radiation. 4. Decrease in volume of right pleural effusion. 5. Aortic Atherosclerosis (ICD10-I70.0). Coronary artery calcifications. 6. Stable pathologic fracture involving the mid body of sternum. Electronically Signed   By: Kerby Moors M.D.   On: 01/23/2019 15:25    ASSESSMENT AND PLAN: This is a very pleasant 73 years old African-American female recently diagnosed with a stage IIIB non-small cell lung cancer, squamous cell carcinoma.  She completed a course of concurrent chemoradiation with weekly carboplatin and  paclitaxel status post 5 cycles with partial response.  She tolerated this treatment well except for the pancytopenia and fatigue.  The patient is currently undergoing treatment with consolidation immunotherapy with Imfinzi status post 12 cycles. The patient continues to tolerate this treatment well with no concerning adverse effects. She had repeat CT scan of the chest performed recently.  I personally and independently reviewed the scan and discussed the results with the patient today. Her scan showed no evidence for disease progression. I recommended for the patient to continue her current treatment with Imfinzi and she will proceed with cycle #13 today. For the anemia, the patient will continue with oral iron tablets for now. She will come back for follow-up visit in 2 weeks for evaluation before the next cycle of her treatment. The patient was advised to call immediately if she has any concerning symptoms in the interval. The patient voices understanding of current disease status and treatment options and is in agreement with the current care plan. All questions were answered. The patient knows to call the clinic with any problems, questions or concerns. We can certainly see the patient much sooner if necessary.  Disclaimer: This note was dictated with voice recognition software. Similar sounding words can inadvertently be transcribed and may not be corrected upon review.

## 2019-01-25 NOTE — Patient Instructions (Signed)
[facility] Discharge Instructions for Patients Receiving Chemotherapy  Today you received the following chemotherapy agents: Durvalumab (Imfinzi)   To help prevent nausea and vomiting after your treatment, we encourage you to take your nausea medication as directed.   If you develop nausea and vomiting that is not controlled by your nausea medication, call the clinic.   BELOW ARE SYMPTOMS THAT SHOULD BE REPORTED IMMEDIATELY:  *FEVER GREATER THAN 100.5 F  *CHILLS WITH OR WITHOUT FEVER  NAUSEA AND VOMITING THAT IS NOT CONTROLLED WITH YOUR NAUSEA MEDICATION  *UNUSUAL SHORTNESS OF BREATH  *UNUSUAL BRUISING OR BLEEDING  TENDERNESS IN MOUTH AND THROAT WITH OR WITHOUT PRESENCE OF ULCERS  *URINARY PROBLEMS  *BOWEL PROBLEMS  UNUSUAL RASH Items with * indicate a potential emergency and should be followed up as soon as possible.  Feel free to call the clinic should you have any questions or concerns. The clinic phone number is [phone].  Please show the CHEMO ALERT CARD at check-in to the Emergency Department and triage nurse.   

## 2019-02-08 ENCOUNTER — Inpatient Hospital Stay: Payer: Medicare Other

## 2019-02-08 ENCOUNTER — Other Ambulatory Visit: Payer: Self-pay

## 2019-02-08 ENCOUNTER — Inpatient Hospital Stay (HOSPITAL_BASED_OUTPATIENT_CLINIC_OR_DEPARTMENT_OTHER): Payer: Medicare Other | Admitting: Physician Assistant

## 2019-02-08 VITALS — BP 140/69 | HR 84 | Temp 98.3°F | Resp 20 | Ht 65.0 in | Wt 173.4 lb

## 2019-02-08 DIAGNOSIS — R946 Abnormal results of thyroid function studies: Secondary | ICD-10-CM | POA: Diagnosis not present

## 2019-02-08 DIAGNOSIS — E119 Type 2 diabetes mellitus without complications: Secondary | ICD-10-CM

## 2019-02-08 DIAGNOSIS — C342 Malignant neoplasm of middle lobe, bronchus or lung: Secondary | ICD-10-CM

## 2019-02-08 DIAGNOSIS — E1165 Type 2 diabetes mellitus with hyperglycemia: Secondary | ICD-10-CM

## 2019-02-08 DIAGNOSIS — Z5112 Encounter for antineoplastic immunotherapy: Secondary | ICD-10-CM

## 2019-02-08 DIAGNOSIS — Z79899 Other long term (current) drug therapy: Secondary | ICD-10-CM | POA: Diagnosis not present

## 2019-02-08 DIAGNOSIS — R5382 Chronic fatigue, unspecified: Secondary | ICD-10-CM

## 2019-02-08 LAB — CMP (CANCER CENTER ONLY)
ALT: 21 U/L (ref 0–44)
AST: 17 U/L (ref 15–41)
Albumin: 3.6 g/dL (ref 3.5–5.0)
Alkaline Phosphatase: 107 U/L (ref 38–126)
Anion gap: 8 (ref 5–15)
BUN: 27 mg/dL — ABNORMAL HIGH (ref 8–23)
CO2: 21 mmol/L — ABNORMAL LOW (ref 22–32)
Calcium: 8.8 mg/dL — ABNORMAL LOW (ref 8.9–10.3)
Chloride: 106 mmol/L (ref 98–111)
Creatinine: 1.33 mg/dL — ABNORMAL HIGH (ref 0.44–1.00)
GFR, Est AFR Am: 46 mL/min — ABNORMAL LOW (ref >60)
GFR, Estimated: 40 mL/min — ABNORMAL LOW (ref >60)
Glucose, Bld: 313 mg/dL — ABNORMAL HIGH (ref 70–99)
Potassium: 4.5 mmol/L (ref 3.5–5.1)
Sodium: 135 mmol/L (ref 135–145)
Total Bilirubin: <0.2 mg/dL — ABNORMAL LOW (ref 0.3–1.2)
Total Protein: 8.1 g/dL (ref 6.5–8.1)

## 2019-02-08 LAB — CBC WITH DIFFERENTIAL (CANCER CENTER ONLY)
Abs Immature Granulocytes: 0.02 10*3/uL (ref 0.00–0.07)
Basophils Absolute: 0 10*3/uL (ref 0.0–0.1)
Basophils Relative: 0 %
Eosinophils Absolute: 0.1 10*3/uL (ref 0.0–0.5)
Eosinophils Relative: 3 %
HCT: 28.7 % — ABNORMAL LOW (ref 36.0–46.0)
Hemoglobin: 8.7 g/dL — ABNORMAL LOW (ref 12.0–15.0)
Immature Granulocytes: 1 %
Lymphocytes Relative: 8 %
Lymphs Abs: 0.3 10*3/uL — ABNORMAL LOW (ref 0.7–4.0)
MCH: 28.4 pg (ref 26.0–34.0)
MCHC: 30.3 g/dL (ref 30.0–36.0)
MCV: 93.8 fL (ref 80.0–100.0)
Monocytes Absolute: 0.3 10*3/uL (ref 0.1–1.0)
Monocytes Relative: 8 %
Neutro Abs: 2.9 10*3/uL (ref 1.7–7.7)
Neutrophils Relative %: 80 %
Platelet Count: 200 10*3/uL (ref 150–400)
RBC: 3.06 MIL/uL — ABNORMAL LOW (ref 3.87–5.11)
RDW: 14.9 % (ref 11.5–15.5)
WBC Count: 3.6 10*3/uL — ABNORMAL LOW (ref 4.0–10.5)
nRBC: 0 % (ref 0.0–0.2)

## 2019-02-08 LAB — TSH: TSH: 27.443 u[IU]/mL — ABNORMAL HIGH (ref 0.308–3.960)

## 2019-02-08 MED ORDER — INSULIN REGULAR HUMAN 100 UNIT/ML IJ SOLN
10.0000 [IU] | Freq: Once | INTRAMUSCULAR | Status: AC
  Administered 2019-02-08: 10 [IU] via SUBCUTANEOUS
  Filled 2019-02-08: qty 10

## 2019-02-08 MED ORDER — SODIUM CHLORIDE 0.9 % IV SOLN
740.0000 mg | Freq: Once | INTRAVENOUS | Status: AC
  Administered 2019-02-08: 740 mg via INTRAVENOUS
  Filled 2019-02-08: qty 10

## 2019-02-08 MED ORDER — SODIUM CHLORIDE 0.9 % IV SOLN
Freq: Once | INTRAVENOUS | Status: AC
  Administered 2019-02-08: 15:00:00 via INTRAVENOUS
  Filled 2019-02-08: qty 250

## 2019-02-08 NOTE — Progress Notes (Signed)
[facility] OFFICE PROGRESS NOTE  Jenna Passy, MD [place] [address]  DIAGNOSIS: Stage IIIB (T3,N3, M0)non-small cell lung cancer, squamous cell carcinoma presented with large right middle lobe lung mass in addition to mediastinal and bilateral hilar lymphadenopathy and suspicious right supraclavicular lymph node diagnosed in August 2019.  PRIOR THERAPY: Acourse of concurrent chemoradiation with weekly carboplatin for AUC of 2 and paclitaxel 45 MG/M2.First dose of chemotherapy given on 05/29/2018.  Status post 5 cycles.  CURRENT THERAPY: Consolidation treatment with immunotherapy with Imfinzi 10 mg/KG every 2 weeks.  First dose August 10, 2018.  Status post 13 cycles.   INTERVAL HISTORY: Jenna [name] 73 y.o. female returns to the clinic for a follow-up visit.  The patient is feeling well today without any concerning complaints.  She continues to tolerate her treatment well without any adverse effects. She denies any fever, chills, night sweats, or weight loss.  She denies any chest pain, shortness of breath, cough, or hemoptysis.  She denies any nausea, vomiting, abdominal pain, diarrhea, or constipation. She denies any headache or visual changes.  She denies any rashes or skin changes.  She denies any polydipsia or mental status changes. She believes she may be urinating more than normal but is unsure. She lives at home independently. She is here today for evaluation before starting cycle #14.   MEDICAL HISTORY: Past Medical History:  Diagnosis Date  . Blood transfusion without reported diagnosis   . Cataract   . DM (diabetes mellitus) ([place])   . GERD (gastroesophageal reflux disease)   . Hyperlipidemia   . Hypertension   . Iron deficiency anemia   . NSCL ca dx'd 03/2018  . Thyroid disease     ALLERGIES:  is allergic to paclitaxel; aspirin; esomeprazole magnesium; and ace inhibitors.  MEDICATIONS:  Current Outpatient  Medications  Medication Sig Dispense Refill  . dexlansoprazole (DEXILANT) 60 MG capsule Take 1qam ac 90 capsule 3  . diphenhydrAMINE (BENADRYL) 2 % cream Apply 1 application topically as needed for itching.    . docusate sodium (COLACE) 100 MG capsule Take 100 mg by mouth daily as needed for mild constipation.    . ferrous sulfate 325 (65 FE) MG tablet Take 1 tablet (325 mg total) by mouth 2 (two) times daily with a meal. (Patient taking differently: Take 325 mg by mouth 2 (two) times daily with a meal. Pt reports she takes 1 tab 3 times a day) 180 tablet 1  . glipiZIDE (GLUCOTROL) 10 MG tablet Take 1 tablet (10 mg total) by mouth 2 (two) times daily before a meal. 180 tablet 1  . labetalol (NORMODYNE) 100 MG tablet Take 1 tablet (100 mg total) by mouth 2 (two) times daily. 180 tablet 1  . levothyroxine (SYNTHROID) 50 MCG tablet Take 1 tablet (50 mcg total) by mouth daily before breakfast. 30 tablet 2  . Multiple Vitamin (MULTIVITAMIN WITH MINERALS) TABS tablet Take 1 tablet by mouth at bedtime.     . prochlorperazine (COMPAZINE) 10 MG tablet Take 1 tablet (10 mg total) by mouth every 6 (six) hours as needed for nausea or vomiting. 30 tablet 0  . simvastatin (ZOCOR) 20 MG tablet Take 1 tablet (20 mg total) by mouth every evening. 90 tablet 1  . sucralfate (CARAFATE) 1 g tablet Take 1 tablet (1 g total) by mouth 4 (four) times daily. (Patient not taking: Reported on 02/08/2019) 120 tablet 2   No current facility-administered medications for this visit.  Facility-Administered Medications Ordered in Other Visits  Medication Dose Route Frequency Provider Last Rate Last Dose  . promethazine (PHENERGAN) injection 12.5 mg  12.5 mg Intravenous Once Harle Stanford., PA-C      . Tbo-Filgrastim (GRANIX) injection 300 mcg  300 mcg Subcutaneous Once Curt Bears, MD        SURGICAL HISTORY:  Past Surgical History:  Procedure Laterality Date  . CATARACT EXTRACTION Right   . CHOLECYSTECTOMY    .  COLONOSCOPY  10/24/2009   normal rectum/1X1cm abnormal lesion in the ascending colon (bx benign). TI normal for 10cm.  Prep difficult/inadequate. f/u TCS 09/2012 recommended  . COLONOSCOPY  10/13/2004   Normal rectum/Diminutive polyps, splenic flexure, cold biopsied/removed.  Remainder of colonic mucosa appeared normal.  . COLONOSCOPY N/A 12/14/2012   JME:QASTMHD polyp-tubular adenoma  . ESOPHAGOGASTRODUODENOSCOPY  10/13/2004    Normal esophagus/ Nodular volcano like lesion in the antrum, either representing a  pancreatic rest or leiomyoma, biopsied.  Remainder of the gastric mucosa appeared normal, normal D1-D2  . ESOPHAGOGASTRODUODENOSCOPY  10/24/2009   Benign biopsies. normal esophagus/small hiatal hernia/nodular lesion antrum/distal greater curvature. duodenal AVM s/p ablation  . GIVENS CAPSULE STUDY  07/27/2010    multiple arteriovenous malformations which could definitely be the contributor to her drifting hemoglobin and hematocrit  . TUBAL LIGATION    . VIDEO BRONCHOSCOPY WITH ENDOBRONCHIAL ULTRASOUND N/A 04/27/2018   Procedure: VIDEO BRONCHOSCOPY WITH ENDOBRONCHIAL ULTRASOUND;  Surgeon: Melrose Nakayama, MD;  Location: [facility] OR;  Service: Thoracic;  Laterality: N/A;    REVIEW OF SYSTEMS:   Review of Systems  Constitutional: Negative for appetite change, chills, fatigue, fever and unexpected weight change.  HENT: Negative for mouth sores, nosebleeds, sore throat and trouble swallowing.   Eyes: Negative for eye problems and icterus.  Respiratory: Negative for cough, hemoptysis, shortness of breath and wheezing.   Cardiovascular: Negative for chest pain and leg swelling.  Gastrointestinal: Negative for abdominal pain, constipation, diarrhea, nausea and vomiting.  Genitourinary: Positive for increased urination? Negative for bladder incontinence, difficulty urinating, dysuria, frequency and hematuria.   Musculoskeletal: Negative for back pain, gait problem, neck pain and neck stiffness.   Skin: Negative for itching and rash.  Neurological: Negative for dizziness, extremity weakness, gait problem, headaches, light-headedness and seizures.  Hematological: Negative for adenopathy. Does not bruise/bleed easily.  Psychiatric/Behavioral: Negative for confusion, depression and sleep disturbance. The patient is not nervous/anxious.     PHYSICAL EXAMINATION:  Blood pressure 140/69, pulse 84, temperature 98.3 F (36.8 C), temperature source Oral, resp. rate 20, height 5\' 5"  (1.651 m), weight 173 lb 6.4 oz (78.7 kg), SpO2 98 %.  ECOG PERFORMANCE STATUS: 1 - Symptomatic but completely ambulatory  Physical Exam  Constitutional: Oriented to person, place, and time and well-developed, well-nourished, and in no distress.   HENT:  Head: Normocephalic and atraumatic.  Mouth/Throat: Oropharynx is clear and moist. No oropharyngeal exudate.  Eyes: Conjunctivae are normal. Right eye exhibits no discharge. Left eye exhibits no discharge. No scleral icterus.  Neck: Normal range of motion. Neck supple.  Cardiovascular: Normal rate, regular rhythm, normal heart sounds and intact distal pulses.   Pulmonary/Chest: Effort normal and breath sounds normal. No respiratory distress. No wheezes. No rales.  Abdominal: Soft. Bowel sounds are normal. Exhibits no distension and no mass. There is no tenderness.  Musculoskeletal: Normal range of motion. Exhibits no edema.  Lymphadenopathy:    No cervical adenopathy.  Neurological: Alert and oriented to person, place, and time. Exhibits normal muscle tone. Gait normal.  Coordination normal.  Skin: Skin is warm and dry. No rash noted. Not diaphoretic. No erythema. No pallor.  Psychiatric: Mood, memory and judgment normal.  Vitals reviewed.  LABORATORY DATA: Lab Results  Component Value Date   WBC 3.6 (L) 02/08/2019   HGB 8.7 (L) 02/08/2019   HCT 28.7 (L) 02/08/2019   MCV 93.8 02/08/2019   PLT 200 02/08/2019      Chemistry      Component Value  Date/Time   NA 135 02/08/2019 1303   K 4.5 02/08/2019 1303   CL 106 02/08/2019 1303   CO2 21 (L) 02/08/2019 1303   BUN 27 (H) 02/08/2019 1303   BUN 12 12/10/2011 0919   CREATININE 1.33 (H) 02/08/2019 1303   CREATININE 0.72 12/10/2011 0919      Component Value Date/Time   CALCIUM 8.8 (L) 02/08/2019 1303   ALKPHOS 107 02/08/2019 1303   ALKPHOS 88 12/10/2011 0919   AST 17 02/08/2019 1303   ALT 21 02/08/2019 1303   BILITOT <0.2 (L) 02/08/2019 1303       RADIOGRAPHIC STUDIES:  Ct Chest W Contrast  Result Date: 01/23/2019 CLINICAL DATA:  Stage IIIB non-small cell lung cancer.  Follow-up. EXAM: CT CHEST WITH CONTRAST TECHNIQUE: Multidetector CT imaging of the chest was performed during intravenous contrast administration. CONTRAST:  73mL OMNIPAQUE IOHEXOL 300 MG/ML  SOLN COMPARISON:  10/31/2018 FINDINGS: Cardiovascular: The heart size is normal. No pericardial effusion. Aortic atherosclerosis. Lad, left circumflex and RCA coronary artery calcifications. Mediastinum/Nodes: Normal appearance of the thyroid gland. The trachea appears patent and is midline. Normal appearance of the esophagus. High right paratracheal, low-density lymph node measures 2 cm, image 29/2. Previously 2.6 cm. Subcarinal lymph node measures 0.9 cm, image 63/2. Previously 1.4 cm. Right hilar lymph node measures 1.7 cm, image 56/2. Previously 2.2 cm. Lungs/Pleura: Small right pleural effusion is decreased in volume from previous exam. Within the right midlung there are diffuse reticular and ground-glass opacities compatible with changes secondary to external beam radiation. Improving right middle lobe atelectasis. The underlying primary lung lesion is now visible measuring 2.4 x 2.1 cm, image 67/7. On the exam from 04/06/2018 this measured 4.5 x 3.9 cm. Patchy tree-in-bud and ground-glass nodules are identified within the left lower lobe, similar to previous exam, likely postinflammatory or infectious. Upper Abdomen: No acute  abnormality. Musculoskeletal: Spondylosis within the thoracic spine. No aggressive lytic or sclerotic bone lesions. Fracture involving the mid body of sternum is again noted. This is unchanged from 10/31/2018. On CT from 05/01/18 there was a corresponding mixed lytic and sclerotic lesion. IMPRESSION: 1. There is been continued interval improved aeration to the right middle lobe with decreased postobstructive atelectasis. With the improved aeration the underlying lung lesion is now visible and appears decreased in size from 04/06/2018. 2. Improvement in mediastinal, subcarinal and right hilar adenopathy. 3. Extensive ground-glass and reticular opacities within the right midlung likely reflecting changes due to external beam radiation. 4. Decrease in volume of right pleural effusion. 5. Aortic Atherosclerosis (ICD10-I70.0). Coronary artery calcifications. 6. Stable pathologic fracture involving the mid body of sternum. Electronically Signed   By: Kerby Moors M.D.   On: 01/23/2019 15:25     ASSESSMENT/PLAN:  This is a very pleasant 73 year old African-American female diagnosed with stage IIIb non-small cell lung cancer, squamous cell carcinoma.  She presented with a large right middle lobe lung mass in addition to mediastinal and bilateral hilar lymphadenopathy.  She also has suspicious right supraclavicular lymphadenopathy.  She was diagnosed in August  2019.  She previously underwent concurrent chemoradiation with carboplatin and paclitaxel.  She is status post 5 cycles with a partial response.  She tolerated treatment fairly well except for pancytopenia and fatigue.  The patient is currently undergoing consolidation immunotherapy with Imfinzi 10 mg/kg IV every 2 weeks.  She is status post 13 cycles.  She has been tolerating it well without any adverse effects.  I recommend that she proceed with cycle #14 today as scheduled.  I have reviewed the patient's labs with her.  The patient's blood sugar is  elevated today at 314.  The patient has a history of diabetes and states that she takes her medicine as prescribed.  However, the patient states that she has been eating "a lot of ice cream sandwiches" and foods high in carbohydrates. I encouraged the patient to be conscientious about what she eats with her diabetes. I will arrange for the patient to receive 10 units of insulin while in the clinic today. Upon recheck of her blood sugar after receiving insulin, her blood sugar was 155.  The patient does not have a glucometer at home.  I will reach out to our social worker to see if she is aware of any resources for the patient to receive supplies to check her sugar at home.  Otherwise, I reviewed signs and symptoms of hyper and hypoglycemia with the patient today.  I provided the patient with her recent lab work including her glucose levels to share with her PCP to determine if her medications need to be adjusted.   The patient's TSH is elevated. She is currently taking 50 mcg of synthroid. I will reach out to the patient to determine if she is still taking her medication as prescribed. I will adjust her dose accordingly if needed.   The patient was advised to call immediately if she has any concerning symptoms in the interval. The patient voices understanding of current disease status and treatment options and is in agreement with the current care plan. All questions were answered. The patient knows to call the clinic with any problems, questions or concerns. We can certainly see the patient much sooner if necessary  No orders of the defined types were placed in this encounter.    Cassandra L Heilingoetter, PA-C 02/08/19

## 2019-02-08 NOTE — Patient Instructions (Signed)
[facility] Discharge Instructions for Patients Receiving Chemotherapy  Today you received the following chemotherapy agents: Durvalumab (Imfinzi)   To help prevent nausea and vomiting after your treatment, we encourage you to take your nausea medication as directed.   If you develop nausea and vomiting that is not controlled by your nausea medication, call the clinic.   BELOW ARE SYMPTOMS THAT SHOULD BE REPORTED IMMEDIATELY:  *FEVER GREATER THAN 100.5 F  *CHILLS WITH OR WITHOUT FEVER  NAUSEA AND VOMITING THAT IS NOT CONTROLLED WITH YOUR NAUSEA MEDICATION  *UNUSUAL SHORTNESS OF BREATH  *UNUSUAL BRUISING OR BLEEDING  TENDERNESS IN MOUTH AND THROAT WITH OR WITHOUT PRESENCE OF ULCERS  *URINARY PROBLEMS  *BOWEL PROBLEMS  UNUSUAL RASH Items with * indicate a potential emergency and should be followed up as soon as possible.  Feel free to call the clinic should you have any questions or concerns. The clinic phone number is [phone].  Please show the CHEMO ALERT CARD at check-in to the Emergency Department and triage nurse.   

## 2019-02-09 ENCOUNTER — Other Ambulatory Visit: Payer: Self-pay | Admitting: Physician Assistant

## 2019-02-09 ENCOUNTER — Telehealth: Payer: Self-pay | Admitting: Physician Assistant

## 2019-02-09 DIAGNOSIS — R7989 Other specified abnormal findings of blood chemistry: Secondary | ICD-10-CM

## 2019-02-09 DIAGNOSIS — E039 Hypothyroidism, unspecified: Secondary | ICD-10-CM

## 2019-02-09 LAB — GLUCOSE, CAPILLARY: Glucose-Capillary: 155 mg/dL — ABNORMAL HIGH (ref 70–99)

## 2019-02-09 MED ORDER — LEVOTHYROXINE SODIUM 75 MCG PO TABS: 75.0000 ug | ORAL_TABLET | Freq: Every day | ORAL | 2 refills | Status: DC

## 2019-02-09 NOTE — Telephone Encounter (Signed)
I spoke to the patient about her TSH. I gave her an initial prescription for synthroid in early April 2020 for 50 mcg. The patient states she is taking this daily as prescribed. Despite this, her TSH continues to be outside of an appropriate range. I sent a new prescription for 75 mcg of Synthroid to her pharmacy. We will recheck her TSH on routine labs.

## 2019-02-22 ENCOUNTER — Other Ambulatory Visit: Payer: Self-pay

## 2019-02-22 ENCOUNTER — Inpatient Hospital Stay: Payer: Medicare Other

## 2019-02-22 ENCOUNTER — Telehealth: Payer: Self-pay | Admitting: Internal Medicine

## 2019-02-22 ENCOUNTER — Inpatient Hospital Stay (HOSPITAL_BASED_OUTPATIENT_CLINIC_OR_DEPARTMENT_OTHER): Payer: Medicare Other | Admitting: Internal Medicine

## 2019-02-22 ENCOUNTER — Inpatient Hospital Stay: Payer: Medicare Other | Attending: Internal Medicine

## 2019-02-22 ENCOUNTER — Encounter: Payer: Self-pay | Admitting: Internal Medicine

## 2019-02-22 VITALS — BP 108/90 | HR 93 | Temp 98.5°F | Resp 18 | Ht 65.0 in | Wt 172.9 lb

## 2019-02-22 DIAGNOSIS — C342 Malignant neoplasm of middle lobe, bronchus or lung: Secondary | ICD-10-CM

## 2019-02-22 DIAGNOSIS — Z5112 Encounter for antineoplastic immunotherapy: Secondary | ICD-10-CM | POA: Diagnosis not present

## 2019-02-22 DIAGNOSIS — D500 Iron deficiency anemia secondary to blood loss (chronic): Secondary | ICD-10-CM

## 2019-02-22 DIAGNOSIS — D649 Anemia, unspecified: Secondary | ICD-10-CM

## 2019-02-22 DIAGNOSIS — Z79899 Other long term (current) drug therapy: Secondary | ICD-10-CM | POA: Insufficient documentation

## 2019-02-22 DIAGNOSIS — R739 Hyperglycemia, unspecified: Secondary | ICD-10-CM | POA: Diagnosis not present

## 2019-02-22 LAB — CMP (CANCER CENTER ONLY)
ALT: 15 U/L (ref 0–44)
AST: 20 U/L (ref 15–41)
Albumin: 3.6 g/dL (ref 3.5–5.0)
Alkaline Phosphatase: 94 U/L (ref 38–126)
Anion gap: 11 (ref 5–15)
BUN: 22 mg/dL (ref 8–23)
CO2: 20 mmol/L — ABNORMAL LOW (ref 22–32)
Calcium: 9.1 mg/dL (ref 8.9–10.3)
Chloride: 107 mmol/L (ref 98–111)
Creatinine: 1.13 mg/dL — ABNORMAL HIGH (ref 0.44–1.00)
GFR, Est AFR Am: 56 mL/min — ABNORMAL LOW (ref >60)
GFR, Estimated: 49 mL/min — ABNORMAL LOW (ref >60)
Glucose, Bld: 172 mg/dL — ABNORMAL HIGH (ref 70–99)
Potassium: 5 mmol/L (ref 3.5–5.1)
Sodium: 138 mmol/L (ref 135–145)
Total Bilirubin: <0.2 mg/dL — ABNORMAL LOW (ref 0.3–1.2)
Total Protein: 8.1 g/dL (ref 6.5–8.1)

## 2019-02-22 LAB — CBC WITH DIFFERENTIAL (CANCER CENTER ONLY)
Abs Immature Granulocytes: 0.02 10*3/uL (ref 0.00–0.07)
Basophils Absolute: 0 10*3/uL (ref 0.0–0.1)
Basophils Relative: 0 %
Eosinophils Absolute: 0.1 10*3/uL (ref 0.0–0.5)
Eosinophils Relative: 2 %
HCT: 33.6 % — ABNORMAL LOW (ref 36.0–46.0)
Hemoglobin: 10.4 g/dL — ABNORMAL LOW (ref 12.0–15.0)
Immature Granulocytes: 1 %
Lymphocytes Relative: 9 %
Lymphs Abs: 0.4 10*3/uL — ABNORMAL LOW (ref 0.7–4.0)
MCH: 28.4 pg (ref 26.0–34.0)
MCHC: 31 g/dL (ref 30.0–36.0)
MCV: 91.8 fL (ref 80.0–100.0)
Monocytes Absolute: 0.3 10*3/uL (ref 0.1–1.0)
Monocytes Relative: 7 %
Neutro Abs: 3.4 10*3/uL (ref 1.7–7.7)
Neutrophils Relative %: 81 %
Platelet Count: 206 10*3/uL (ref 150–400)
RBC: 3.66 MIL/uL — ABNORMAL LOW (ref 3.87–5.11)
RDW: 14.6 % (ref 11.5–15.5)
WBC Count: 4.1 10*3/uL (ref 4.0–10.5)
nRBC: 0 % (ref 0.0–0.2)

## 2019-02-22 MED ORDER — SODIUM CHLORIDE 0.9 % IV SOLN
Freq: Once | INTRAVENOUS | Status: AC
  Administered 2019-02-22: 10:00:00 via INTRAVENOUS
  Filled 2019-02-22: qty 250

## 2019-02-22 MED ORDER — SODIUM CHLORIDE 0.9 % IV SOLN
740.0000 mg | Freq: Once | INTRAVENOUS | Status: AC
  Administered 2019-02-22: 740 mg via INTRAVENOUS
  Filled 2019-02-22: qty 10

## 2019-02-22 NOTE — Progress Notes (Signed)
Jenna [name] [phone]   [phone]) [phone]  OFFICE PROGRESS NOTE  [name], MD [place] [address]  DIAGNOSIS: Stage IIIB (T3,N3, M0)non-small cell lung cancer, squamous cell carcinoma presented with large right middle lobe lung breast in addition to mediastinal and bilateral hilar lymphadenopathy and suspicious right supraclavicular lymph node diagnosed in August 2019.   PRIOR THERAPY: A course of concurrent chemoradiation with weekly carboplatin for AUC of 2 and paclitaxel 45 MG/M2.  First dose of chemotherapy given on 05/29/2018.  Status post 5 cycles.  CURRENT THERAPY:  Consolidation treatment with immunotherapy with Imfinzi 10 mg/KG every 2 weeks.  First dose August 10, 2018.  Status post 14 cycles.  INTERVAL HISTORY: Jenna [name] 73 y.o. female returns to the clinic today for follow-up visit.  The patient is feeling fine today with no concerning complaints.  She denied having any chest pain, shortness of breath, cough or hemoptysis.  She denied having any fever or chills.  She has no nausea, vomiting, diarrhea or constipation.  She denied having any headache or visual changes.  She continues to tolerate her treatment with Imfinzi fairly well.  She is here today for evaluation before starting cycle #15.   MEDICAL HISTORY: Past Medical History:  Diagnosis Date  . Blood transfusion without reported diagnosis   . Cataract   . DM (diabetes mellitus) ([place])   . GERD (gastroesophageal reflux disease)   . Hyperlipidemia   . Hypertension   . Iron deficiency anemia   . NSCL ca dx'd 03/2018  . Thyroid disease     ALLERGIES:  is allergic to paclitaxel; aspirin; esomeprazole magnesium; and ace inhibitors.  MEDICATIONS:  Current Outpatient Medications  Medication Sig Dispense Refill  . dexlansoprazole (DEXILANT) 60 MG capsule Take 1qam ac 90 capsule 3  . diphenhydrAMINE (BENADRYL) 2 % cream Apply 1 application  topically as needed for itching.    . docusate sodium (COLACE) 100 MG capsule Take 100 mg by mouth daily as needed for mild constipation.    . ferrous sulfate 325 (65 FE) MG tablet Take 1 tablet (325 mg total) by mouth 2 (two) times daily with a meal. (Patient taking differently: Take 325 mg by mouth 2 (two) times daily with a meal. Pt reports she takes 1 tab 3 times a day) 180 tablet 1  . glipiZIDE (GLUCOTROL) 10 MG tablet Take 1 tablet (10 mg total) by mouth 2 (two) times daily before a meal. 180 tablet 1  . labetalol (NORMODYNE) 100 MG tablet Take 1 tablet (100 mg total) by mouth 2 (two) times daily. 180 tablet 1  . levothyroxine (SYNTHROID) 75 MCG tablet Take 1 tablet (75 mcg total) by mouth daily before breakfast. 30 tablet 2  . Multiple Vitamin (MULTIVITAMIN WITH MINERALS) TABS tablet Take 1 tablet by mouth at bedtime.     . prochlorperazine (COMPAZINE) 10 MG tablet Take 1 tablet (10 mg total) by mouth every 6 (six) hours as needed for nausea or vomiting. 30 tablet 0  . simvastatin (ZOCOR) 20 MG tablet Take 1 tablet (20 mg total) by mouth every evening. 90 tablet 1  . sucralfate (CARAFATE) 1 g tablet Take 1 tablet (1 g total) by mouth 4 (four) times daily. (Patient not taking: Reported on 02/08/2019) 120 tablet 2   No current facility-administered medications for this visit.    Facility-Administered Medications Ordered in Other Visits  Medication Dose Route Frequency Provider Last Rate Last Dose  .  promethazine (PHENERGAN) injection 12.5 mg  12.5 mg Intravenous Once Harle Stanford., PA-C      . Tbo-Filgrastim (GRANIX) injection 300 mcg  300 mcg Subcutaneous Once Curt Bears, MD        SURGICAL HISTORY:  Past Surgical History:  Procedure Laterality Date  . CATARACT EXTRACTION Right   . CHOLECYSTECTOMY    . COLONOSCOPY  10/24/2009   normal rectum/1X1cm abnormal lesion in the ascending colon (bx benign). TI normal for 10cm.  Prep difficult/inadequate. f/u TCS 09/2012 recommended  .  COLONOSCOPY  10/13/2004   Normal rectum/Diminutive polyps, splenic flexure, cold biopsied/removed.  Remainder of colonic mucosa appeared normal.  . COLONOSCOPY N/A 12/14/2012   [id number] polyp-tubular adenoma  . ESOPHAGOGASTRODUODENOSCOPY  10/13/2004    Normal esophagus/ Nodular volcano like lesion in the antrum, either representing a  pancreatic rest or leiomyoma, biopsied.  Remainder of the gastric mucosa appeared normal, normal D1-D2  . ESOPHAGOGASTRODUODENOSCOPY  10/24/2009   Benign biopsies. normal esophagus/small hiatal hernia/nodular lesion antrum/distal greater curvature. duodenal AVM s/p ablation  . GIVENS CAPSULE STUDY  07/27/2010    multiple arteriovenous malformations which could definitely be the contributor to her drifting hemoglobin and hematocrit  . TUBAL LIGATION    . VIDEO BRONCHOSCOPY WITH ENDOBRONCHIAL ULTRASOUND N/A 04/27/2018   Procedure: VIDEO BRONCHOSCOPY WITH ENDOBRONCHIAL ULTRASOUND;  Surgeon: Melrose Nakayama, MD;  Location: [facility] OR;  Service: Thoracic;  Laterality: N/A;    REVIEW OF SYSTEMS:  A comprehensive review of systems was negative except for: Constitutional: positive for fatigue   PHYSICAL EXAMINATION: General appearance: alert, cooperative, fatigued and no distress Head: Normocephalic, without obvious abnormality, atraumatic Neck: no adenopathy, no JVD, supple, symmetrical, trachea midline and thyroid not enlarged, symmetric, no tenderness/mass/nodules Lymph nodes: Cervical, supraclavicular, and axillary nodes normal. Resp: clear to auscultation bilaterally Back: symmetric, no curvature. ROM normal. No CVA tenderness. Cardio: regular rate and rhythm, S1, S2 normal, no murmur, click, rub or gallop GI: soft, non-tender; bowel sounds normal; no masses,  no organomegaly Extremities: extremities normal, atraumatic, no cyanosis or edema  ECOG PERFORMANCE STATUS: 1 - Symptomatic but completely ambulatory  Blood pressure 108/90, pulse 93, temperature 98.5 F  (36.9 C), resp. rate 18, height 5\' 5"  (1.651 m), weight 172 lb 14.4 oz (78.4 kg), SpO2 100 %.  LABORATORY DATA: Lab Results  Component Value Date   WBC 3.6 (L) 02/08/2019   HGB 8.7 (L) 02/08/2019   HCT 28.7 (L) 02/08/2019   MCV 93.8 02/08/2019   PLT 200 02/08/2019      Chemistry      Component Value Date/Time   NA 135 02/08/2019 1303   K 4.5 02/08/2019 1303   CL 106 02/08/2019 1303   CO2 21 (L) 02/08/2019 1303   BUN 27 (H) 02/08/2019 1303   BUN 12 12/10/2011 0919   CREATININE 1.33 (H) 02/08/2019 1303   CREATININE 0.72 12/10/2011 0919      Component Value Date/Time   CALCIUM 8.8 (L) 02/08/2019 1303   ALKPHOS 107 02/08/2019 1303   ALKPHOS 88 12/10/2011 0919   AST 17 02/08/2019 1303   ALT 21 02/08/2019 1303   BILITOT <0.2 (L) 02/08/2019 1303       RADIOGRAPHIC STUDIES: Ct Chest W Contrast  Result Date: 01/23/2019 CLINICAL DATA:  Stage IIIB non-small cell lung cancer.  Follow-up. EXAM: CT CHEST WITH CONTRAST TECHNIQUE: Multidetector CT imaging of the chest was performed during intravenous contrast administration. CONTRAST:  33mL OMNIPAQUE IOHEXOL 300 MG/ML  SOLN COMPARISON:  10/31/2018 FINDINGS: Cardiovascular: The heart  size is normal. No pericardial effusion. Aortic atherosclerosis. Lad, left circumflex and RCA coronary artery calcifications. Mediastinum/Nodes: Normal appearance of the thyroid gland. The trachea appears patent and is midline. Normal appearance of the esophagus. High right paratracheal, low-density lymph node measures 2 cm, image 29/2. Previously 2.6 cm. Subcarinal lymph node measures 0.9 cm, image 63/2. Previously 1.4 cm. Right hilar lymph node measures 1.7 cm, image 56/2. Previously 2.2 cm. Lungs/Pleura: Small right pleural effusion is decreased in volume from previous exam. Within the right midlung there are diffuse reticular and ground-glass opacities compatible with changes secondary to external beam radiation. Improving right middle lobe atelectasis. The  underlying primary lung lesion is now visible measuring 2.4 x 2.1 cm, image 67/7. On the exam from 04/06/2018 this measured 4.5 x 3.9 cm. Patchy tree-in-bud and ground-glass nodules are identified within the left lower lobe, similar to previous exam, likely postinflammatory or infectious. Upper Abdomen: No acute abnormality. Musculoskeletal: Spondylosis within the thoracic spine. No aggressive lytic or sclerotic bone lesions. Fracture involving the mid body of sternum is again noted. This is unchanged from 10/31/2018. On CT from 05/01/18 there was a corresponding mixed lytic and sclerotic lesion. IMPRESSION: 1. There is been continued interval improved aeration to the right middle lobe with decreased postobstructive atelectasis. With the improved aeration the underlying lung lesion is now visible and appears decreased in size from 04/06/2018. 2. Improvement in mediastinal, subcarinal and right hilar adenopathy. 3. Extensive ground-glass and reticular opacities within the right midlung likely reflecting changes due to external beam radiation. 4. Decrease in volume of right pleural effusion. 5. Aortic Atherosclerosis (ICD10-I70.0). Coronary artery calcifications. 6. Stable pathologic fracture involving the mid body of sternum. Electronically Signed   By: Kerby Moors M.D.   On: 01/23/2019 15:25    ASSESSMENT AND PLAN: This is a very pleasant 73 years old African-American female recently diagnosed with a stage IIIB non-small cell lung cancer, squamous cell carcinoma.  She completed a course of concurrent chemoradiation with weekly carboplatin and paclitaxel status post 5 cycles with partial response.  She tolerated this treatment well except for the pancytopenia and fatigue.  The patient is currently undergoing treatment with consolidation immunotherapy with Imfinzi status post 14 cycles. The patient continues to tolerate her treatment well with no concerning adverse effects. I recommended for her to proceed  with cycle #15 today as planned. She will come back for follow-up visit in 2 weeks for evaluation before the next cycle of her treatment. For the anemia, the patient will continue with oral iron tablets for now. She was advised to call immediately if she has any concerning symptoms in the interval. The patient voices understanding of current disease status and treatment options and is in agreement with the current care plan. All questions were answered. The patient knows to call the clinic with any problems, questions or concerns. We can certainly see the patient much sooner if necessary.  Disclaimer: This note was dictated with voice recognition software. Similar sounding words can inadvertently be transcribed and may not be corrected upon review.

## 2019-02-22 NOTE — Patient Instructions (Signed)
[facility] Discharge Instructions for Patients Receiving Chemotherapy  Today you received the following chemotherapy agents: Durvalumab (Imfinzi)   To help prevent nausea and vomiting after your treatment, we encourage you to take your nausea medication as directed.   If you develop nausea and vomiting that is not controlled by your nausea medication, call the clinic.   BELOW ARE SYMPTOMS THAT SHOULD BE REPORTED IMMEDIATELY:  *FEVER GREATER THAN 100.5 F  *CHILLS WITH OR WITHOUT FEVER  NAUSEA AND VOMITING THAT IS NOT CONTROLLED WITH YOUR NAUSEA MEDICATION  *UNUSUAL SHORTNESS OF BREATH  *UNUSUAL BRUISING OR BLEEDING  TENDERNESS IN MOUTH AND THROAT WITH OR WITHOUT PRESENCE OF ULCERS  *URINARY PROBLEMS  *BOWEL PROBLEMS  UNUSUAL RASH Items with * indicate a potential emergency and should be followed up as soon as possible.  Feel free to call the clinic should you have any questions or concerns. The clinic phone number is [phone].  Please show the CHEMO ALERT CARD at check-in to the Emergency Department and triage nurse.   

## 2019-02-22 NOTE — Telephone Encounter (Signed)
Scheduled appt per 7/09 los - gave patient avs and calender per los.

## 2019-03-08 ENCOUNTER — Inpatient Hospital Stay (HOSPITAL_BASED_OUTPATIENT_CLINIC_OR_DEPARTMENT_OTHER): Payer: Medicare Other | Admitting: Internal Medicine

## 2019-03-08 ENCOUNTER — Encounter: Payer: Self-pay | Admitting: Internal Medicine

## 2019-03-08 ENCOUNTER — Inpatient Hospital Stay: Payer: Medicare Other

## 2019-03-08 ENCOUNTER — Other Ambulatory Visit: Payer: Self-pay

## 2019-03-08 VITALS — BP 153/52 | HR 99 | Temp 98.0°F | Resp 18 | Ht 65.0 in | Wt 175.9 lb

## 2019-03-08 DIAGNOSIS — Z79899 Other long term (current) drug therapy: Secondary | ICD-10-CM | POA: Diagnosis not present

## 2019-03-08 DIAGNOSIS — E119 Type 2 diabetes mellitus without complications: Secondary | ICD-10-CM

## 2019-03-08 DIAGNOSIS — C342 Malignant neoplasm of middle lobe, bronchus or lung: Secondary | ICD-10-CM

## 2019-03-08 DIAGNOSIS — Z794 Long term (current) use of insulin: Secondary | ICD-10-CM | POA: Diagnosis not present

## 2019-03-08 DIAGNOSIS — Z5112 Encounter for antineoplastic immunotherapy: Secondary | ICD-10-CM | POA: Diagnosis not present

## 2019-03-08 DIAGNOSIS — R5382 Chronic fatigue, unspecified: Secondary | ICD-10-CM

## 2019-03-08 DIAGNOSIS — R739 Hyperglycemia, unspecified: Secondary | ICD-10-CM

## 2019-03-08 LAB — CBC WITH DIFFERENTIAL (CANCER CENTER ONLY)
Abs Immature Granulocytes: 0.02 10*3/uL (ref 0.00–0.07)
Basophils Absolute: 0 10*3/uL (ref 0.0–0.1)
Basophils Relative: 0 %
Eosinophils Absolute: 0.1 10*3/uL (ref 0.0–0.5)
Eosinophils Relative: 2 %
HCT: 30.8 % — ABNORMAL LOW (ref 36.0–46.0)
Hemoglobin: 9.6 g/dL — ABNORMAL LOW (ref 12.0–15.0)
Immature Granulocytes: 1 %
Lymphocytes Relative: 6 %
Lymphs Abs: 0.3 10*3/uL — ABNORMAL LOW (ref 0.7–4.0)
MCH: 28.5 pg (ref 26.0–34.0)
MCHC: 31.2 g/dL (ref 30.0–36.0)
MCV: 91.4 fL (ref 80.0–100.0)
Monocytes Absolute: 0.2 10*3/uL (ref 0.1–1.0)
Monocytes Relative: 5 %
Neutro Abs: 3.9 10*3/uL (ref 1.7–7.7)
Neutrophils Relative %: 86 %
Platelet Count: 184 10*3/uL (ref 150–400)
RBC: 3.37 MIL/uL — ABNORMAL LOW (ref 3.87–5.11)
RDW: 14.5 % (ref 11.5–15.5)
WBC Count: 4.4 10*3/uL (ref 4.0–10.5)
nRBC: 0 % (ref 0.0–0.2)

## 2019-03-08 LAB — TSH: TSH: 13.299 u[IU]/mL — ABNORMAL HIGH (ref 0.308–3.960)

## 2019-03-08 LAB — CMP (CANCER CENTER ONLY)
ALT: 22 U/L (ref 0–44)
AST: 25 U/L (ref 15–41)
Albumin: 3.5 g/dL (ref 3.5–5.0)
Alkaline Phosphatase: 106 U/L (ref 38–126)
Anion gap: 11 (ref 5–15)
BUN: 24 mg/dL — ABNORMAL HIGH (ref 8–23)
CO2: 20 mmol/L — ABNORMAL LOW (ref 22–32)
Calcium: 9.4 mg/dL (ref 8.9–10.3)
Chloride: 104 mmol/L (ref 98–111)
Creatinine: 1.46 mg/dL — ABNORMAL HIGH (ref 0.44–1.00)
GFR, Est AFR Am: 41 mL/min — ABNORMAL LOW (ref >60)
GFR, Estimated: 36 mL/min — ABNORMAL LOW (ref >60)
Glucose, Bld: 372 mg/dL — ABNORMAL HIGH (ref 70–99)
Potassium: 5.3 mmol/L — ABNORMAL HIGH (ref 3.5–5.1)
Sodium: 135 mmol/L (ref 135–145)
Total Bilirubin: 0.2 mg/dL — ABNORMAL LOW (ref 0.3–1.2)
Total Protein: 8.2 g/dL — ABNORMAL HIGH (ref 6.5–8.1)

## 2019-03-08 LAB — GLUCOSE, CAPILLARY: Glucose-Capillary: 241 mg/dL — ABNORMAL HIGH (ref 70–99)

## 2019-03-08 MED ORDER — INSULIN REGULAR HUMAN 100 UNIT/ML IJ SOLN
5.0000 [IU] | Freq: Once | INTRAMUSCULAR | Status: AC
  Administered 2019-03-08: 5 [IU] via SUBCUTANEOUS
  Filled 2019-03-08: qty 10

## 2019-03-08 MED ORDER — SODIUM CHLORIDE 0.9 % IV SOLN
Freq: Once | INTRAVENOUS | Status: AC
  Administered 2019-03-08: 12:00:00 via INTRAVENOUS
  Filled 2019-03-08: qty 250

## 2019-03-08 MED ORDER — SODIUM CHLORIDE 0.9 % IV SOLN
10.8000 mg/kg | Freq: Once | INTRAVENOUS | Status: AC
  Administered 2019-03-08: 740 mg via INTRAVENOUS
  Filled 2019-03-08: qty 10

## 2019-03-08 NOTE — Patient Instructions (Signed)
[facility] Discharge Instructions for Patients Receiving Chemotherapy  Today you received the following chemotherapy agents: Durvalumab (Imfinzi)   To help prevent nausea and vomiting after your treatment, we encourage you to take your nausea medication as directed.   If you develop nausea and vomiting that is not controlled by your nausea medication, call the clinic.   BELOW ARE SYMPTOMS THAT SHOULD BE REPORTED IMMEDIATELY:  *FEVER GREATER THAN 100.5 F  *CHILLS WITH OR WITHOUT FEVER  NAUSEA AND VOMITING THAT IS NOT CONTROLLED WITH YOUR NAUSEA MEDICATION  *UNUSUAL SHORTNESS OF BREATH  *UNUSUAL BRUISING OR BLEEDING  TENDERNESS IN MOUTH AND THROAT WITH OR WITHOUT PRESENCE OF ULCERS  *URINARY PROBLEMS  *BOWEL PROBLEMS  UNUSUAL RASH Items with * indicate a potential emergency and should be followed up as soon as possible.  Feel free to call the clinic should you have any questions or concerns. The clinic phone number is [phone].  Please show the CHEMO ALERT CARD at check-in to the Emergency Department and triage nurse.   

## 2019-03-08 NOTE — Progress Notes (Signed)
[facility] Telephone:([phone]   Fax:(336) [phone]  OFFICE PROGRESS NOTE  [name], MD [place] [address]  DIAGNOSIS: Stage IIIB (T3,N3, M0)non-small cell lung cancer, squamous cell carcinoma presented with large right middle lobe lung breast in addition to mediastinal and bilateral hilar lymphadenopathy and suspicious right supraclavicular lymph node diagnosed in August 2019.   PRIOR THERAPY: A course of concurrent chemoradiation with weekly carboplatin for AUC of 2 and paclitaxel 45 MG/M2.  First dose of chemotherapy given on 05/29/2018.  Status post 5 cycles.  CURRENT THERAPY:  Consolidation treatment with immunotherapy with Imfinzi 10 mg/KG every 2 weeks.  First dose August 10, 2018.  Status post 15 cycles.  INTERVAL HISTORY: Jenna [name] 73 y.o. female returns to the clinic today for follow-up visit.  The patient is feeling fine today with no concerning complaints.  She denied having any chest pain, shortness of breath, cough or hemoptysis.  She denied having any fever or chills.  She has no nausea, vomiting, diarrhea or constipation.  She denied having any headache or visual changes.  She continues to tolerate her consolidation treatment with immunotherapy fairly well.  She is here for evaluation before starting cycle #16.   MEDICAL HISTORY: Past Medical History:  Diagnosis Date  . Blood transfusion without reported diagnosis   . Cataract   . DM (diabetes mellitus) ([place])   . GERD (gastroesophageal reflux disease)   . Hyperlipidemia   . Hypertension   . Iron deficiency anemia   . NSCL ca dx'd 03/2018  . Thyroid disease     ALLERGIES:  is allergic to paclitaxel; aspirin; esomeprazole magnesium; and ace inhibitors.  MEDICATIONS:  Current Outpatient Medications  Medication Sig Dispense Refill  . dexlansoprazole (DEXILANT) 60 MG capsule Take 1qam ac 90 capsule 3  . diphenhydrAMINE (BENADRYL) 2 % cream Apply  1 application topically as needed for itching.    . docusate sodium (COLACE) 100 MG capsule Take 100 mg by mouth daily as needed for mild constipation.    . ferrous sulfate 325 (65 FE) MG tablet Take 1 tablet (325 mg total) by mouth 2 (two) times daily with a meal. (Patient taking differently: Take 325 mg by mouth 2 (two) times daily with a meal. Pt reports she takes 1 tab 3 times a day) 180 tablet 1  . glipiZIDE (GLUCOTROL) 10 MG tablet Take 1 tablet (10 mg total) by mouth 2 (two) times daily before a meal. 180 tablet 1  . labetalol (NORMODYNE) 100 MG tablet Take 1 tablet (100 mg total) by mouth 2 (two) times daily. 180 tablet 1  . levothyroxine (SYNTHROID) 75 MCG tablet Take 1 tablet (75 mcg total) by mouth daily before breakfast. 30 tablet 2  . Multiple Vitamin (MULTIVITAMIN WITH MINERALS) TABS tablet Take 1 tablet by mouth at bedtime.     . prochlorperazine (COMPAZINE) 10 MG tablet Take 1 tablet (10 mg total) by mouth every 6 (six) hours as needed for nausea or vomiting. 30 tablet 0  . simvastatin (ZOCOR) 20 MG tablet Take 1 tablet (20 mg total) by mouth every evening. 90 tablet 1  . sucralfate (CARAFATE) 1 g tablet Take 1 tablet (1 g total) by mouth 4 (four) times daily. (Patient not taking: Reported on 02/08/2019) 120 tablet 2   No current facility-administered medications for this visit.    Facility-Administered Medications Ordered in Other Visits  Medication Dose Route Frequency Provider Last Rate Last Dose  .  promethazine (PHENERGAN) injection 12.5 mg  12.5 mg Intravenous Once Harle Stanford., PA-C      . Tbo-Filgrastim (GRANIX) injection 300 mcg  300 mcg Subcutaneous Once Curt Bears, MD        SURGICAL HISTORY:  Past Surgical History:  Procedure Laterality Date  . CATARACT EXTRACTION Right   . CHOLECYSTECTOMY    . COLONOSCOPY  10/24/2009   normal rectum/1X1cm abnormal lesion in the ascending colon (bx benign). TI normal for 10cm.  Prep difficult/inadequate. f/u TCS 09/2012  recommended  . COLONOSCOPY  10/13/2004   Normal rectum/Diminutive polyps, splenic flexure, cold biopsied/removed.  Remainder of colonic mucosa appeared normal.  . COLONOSCOPY N/A 12/14/2012   [id number] polyp-tubular adenoma  . ESOPHAGOGASTRODUODENOSCOPY  10/13/2004    Normal esophagus/ Nodular volcano like lesion in the antrum, either representing a  pancreatic rest or leiomyoma, biopsied.  Remainder of the gastric mucosa appeared normal, normal D1-D2  . ESOPHAGOGASTRODUODENOSCOPY  10/24/2009   Benign biopsies. normal esophagus/small hiatal hernia/nodular lesion antrum/distal greater curvature. duodenal AVM s/p ablation  . GIVENS CAPSULE STUDY  07/27/2010    multiple arteriovenous malformations which could definitely be the contributor to her drifting hemoglobin and hematocrit  . TUBAL LIGATION    . VIDEO BRONCHOSCOPY WITH ENDOBRONCHIAL ULTRASOUND N/A 04/27/2018   Procedure: VIDEO BRONCHOSCOPY WITH ENDOBRONCHIAL ULTRASOUND;  Surgeon: Melrose Nakayama, MD;  Location: [facility] OR;  Service: Thoracic;  Laterality: N/A;    REVIEW OF SYSTEMS:  A comprehensive review of systems was negative except for: Constitutional: positive for fatigue   PHYSICAL EXAMINATION: General appearance: alert, cooperative, fatigued and no distress Head: Normocephalic, without obvious abnormality, atraumatic Neck: no adenopathy, no JVD, supple, symmetrical, trachea midline and thyroid not enlarged, symmetric, no tenderness/mass/nodules Lymph nodes: Cervical, supraclavicular, and axillary nodes normal. Resp: clear to auscultation bilaterally Back: symmetric, no curvature. ROM normal. No CVA tenderness. Cardio: regular rate and rhythm, S1, S2 normal, no murmur, click, rub or gallop GI: soft, non-tender; bowel sounds normal; no masses,  no organomegaly Extremities: extremities normal, atraumatic, no cyanosis or edema  ECOG PERFORMANCE STATUS: 1 - Symptomatic but completely ambulatory  Blood pressure (!) 153/52, pulse 99,  temperature 98 F (36.7 C), temperature source Temporal, resp. rate 18, height 5\' 5"  (1.651 m), weight 175 lb 14.4 oz (79.8 kg), SpO2 98 %.  LABORATORY DATA: Lab Results  Component Value Date   WBC 4.4 03/08/2019   HGB 9.6 (L) 03/08/2019   HCT 30.8 (L) 03/08/2019   MCV 91.4 03/08/2019   PLT 184 03/08/2019      Chemistry      Component Value Date/Time   NA 135 03/08/2019 1047   K 5.3 (H) 03/08/2019 1047   CL 104 03/08/2019 1047   CO2 20 (L) 03/08/2019 1047   BUN 24 (H) 03/08/2019 1047   BUN 12 12/10/2011 0919   CREATININE 1.46 (H) 03/08/2019 1047   CREATININE 0.72 12/10/2011 0919      Component Value Date/Time   CALCIUM 9.4 03/08/2019 1047   ALKPHOS 106 03/08/2019 1047   ALKPHOS 88 12/10/2011 0919   AST 25 03/08/2019 1047   ALT 22 03/08/2019 1047   BILITOT 0.2 (L) 03/08/2019 1047       RADIOGRAPHIC STUDIES: No results found.  ASSESSMENT AND PLAN: This is a very pleasant 73 years old African-American female recently diagnosed with a stage IIIB non-small cell lung cancer, squamous cell carcinoma.  She completed a course of concurrent chemoradiation with weekly carboplatin and paclitaxel status post 5 cycles with partial response.  She tolerated this treatment well except for the pancytopenia and fatigue.  The patient is currently undergoing treatment with consolidation immunotherapy with Imfinzi status post 15 cycles. She continues to tolerate her treatment well with no concerning adverse effects. I recommended for the patient to proceed with cycle #16 today as planned. For the hyperglycemia, I will give the patient 5 units of regular insulin and she was advised to monitor her blood sugar closely at home. She will come back for follow-up visit in 2 weeks for evaluation before starting cycle #17. The patient was advised to call immediately if she has any other concerning symptoms in the interval. The patient voices understanding of current disease status and treatment  options and is in agreement with the current care plan. All questions were answered. The patient knows to call the clinic with any problems, questions or concerns. We can certainly see the patient much sooner if necessary.  Disclaimer: This note was dictated with voice recognition software. Similar sounding words can inadvertently be transcribed and may not be corrected upon review.

## 2019-03-08 NOTE — Progress Notes (Signed)
Blood sugar 241 mg/dl. Dr. Julien Nordmann notified. No further orders at this time

## 2019-03-19 ENCOUNTER — Other Ambulatory Visit: Payer: Self-pay | Admitting: Family Medicine

## 2019-03-19 DIAGNOSIS — I10 Essential (primary) hypertension: Secondary | ICD-10-CM

## 2019-03-19 DIAGNOSIS — I160 Hypertensive urgency: Secondary | ICD-10-CM

## 2019-03-20 MED ORDER — LABETALOL HCL 100 MG PO TABS: 100.0000 mg | ORAL_TABLET | Freq: Two times a day (BID) | ORAL | 0 refills | Status: DC

## 2019-03-20 NOTE — Telephone Encounter (Signed)
Resent/thx dmf

## 2019-03-22 ENCOUNTER — Inpatient Hospital Stay: Payer: Medicare Other

## 2019-03-22 ENCOUNTER — Other Ambulatory Visit: Payer: Self-pay

## 2019-03-22 ENCOUNTER — Inpatient Hospital Stay: Payer: Medicare Other | Attending: Internal Medicine

## 2019-03-22 ENCOUNTER — Encounter: Payer: Self-pay | Admitting: Internal Medicine

## 2019-03-22 ENCOUNTER — Inpatient Hospital Stay (HOSPITAL_BASED_OUTPATIENT_CLINIC_OR_DEPARTMENT_OTHER): Payer: Medicare Other | Admitting: Internal Medicine

## 2019-03-22 VITALS — BP 148/87 | HR 91 | Temp 98.7°F | Resp 20 | Ht 65.0 in | Wt 176.2 lb

## 2019-03-22 DIAGNOSIS — C342 Malignant neoplasm of middle lobe, bronchus or lung: Secondary | ICD-10-CM

## 2019-03-22 DIAGNOSIS — I10 Essential (primary) hypertension: Secondary | ICD-10-CM | POA: Diagnosis not present

## 2019-03-22 DIAGNOSIS — Z7984 Long term (current) use of oral hypoglycemic drugs: Secondary | ICD-10-CM | POA: Diagnosis not present

## 2019-03-22 DIAGNOSIS — E785 Hyperlipidemia, unspecified: Secondary | ICD-10-CM | POA: Diagnosis not present

## 2019-03-22 DIAGNOSIS — E079 Disorder of thyroid, unspecified: Secondary | ICD-10-CM | POA: Diagnosis not present

## 2019-03-22 DIAGNOSIS — Z5112 Encounter for antineoplastic immunotherapy: Secondary | ICD-10-CM | POA: Diagnosis not present

## 2019-03-22 DIAGNOSIS — Z79899 Other long term (current) drug therapy: Secondary | ICD-10-CM | POA: Diagnosis not present

## 2019-03-22 DIAGNOSIS — D500 Iron deficiency anemia secondary to blood loss (chronic): Secondary | ICD-10-CM | POA: Diagnosis not present

## 2019-03-22 DIAGNOSIS — E1165 Type 2 diabetes mellitus with hyperglycemia: Secondary | ICD-10-CM | POA: Insufficient documentation

## 2019-03-22 LAB — CMP (CANCER CENTER ONLY)
ALT: 16 U/L (ref 0–44)
AST: 19 U/L (ref 15–41)
Albumin: 3.6 g/dL (ref 3.5–5.0)
Alkaline Phosphatase: 93 U/L (ref 38–126)
Anion gap: 12 (ref 5–15)
BUN: 21 mg/dL (ref 8–23)
CO2: 20 mmol/L — ABNORMAL LOW (ref 22–32)
Calcium: 9.5 mg/dL (ref 8.9–10.3)
Chloride: 106 mmol/L (ref 98–111)
Creatinine: 1.14 mg/dL — ABNORMAL HIGH (ref 0.44–1.00)
GFR, Est AFR Am: 56 mL/min — ABNORMAL LOW (ref >60)
GFR, Estimated: 48 mL/min — ABNORMAL LOW (ref >60)
Glucose, Bld: 226 mg/dL — ABNORMAL HIGH (ref 70–99)
Potassium: 4.5 mmol/L (ref 3.5–5.1)
Sodium: 138 mmol/L (ref 135–145)
Total Bilirubin: 0.2 mg/dL — ABNORMAL LOW (ref 0.3–1.2)
Total Protein: 8 g/dL (ref 6.5–8.1)

## 2019-03-22 LAB — CBC WITH DIFFERENTIAL (CANCER CENTER ONLY)
Abs Immature Granulocytes: 0.03 10*3/uL (ref 0.00–0.07)
Basophils Absolute: 0 10*3/uL (ref 0.0–0.1)
Basophils Relative: 0 %
Eosinophils Absolute: 0.1 10*3/uL (ref 0.0–0.5)
Eosinophils Relative: 1 %
HCT: 30.8 % — ABNORMAL LOW (ref 36.0–46.0)
Hemoglobin: 9.6 g/dL — ABNORMAL LOW (ref 12.0–15.0)
Immature Granulocytes: 1 %
Lymphocytes Relative: 7 %
Lymphs Abs: 0.3 10*3/uL — ABNORMAL LOW (ref 0.7–4.0)
MCH: 27.8 pg (ref 26.0–34.0)
MCHC: 31.2 g/dL (ref 30.0–36.0)
MCV: 89.3 fL (ref 80.0–100.0)
Monocytes Absolute: 0.3 10*3/uL (ref 0.1–1.0)
Monocytes Relative: 6 %
Neutro Abs: 3.9 10*3/uL (ref 1.7–7.7)
Neutrophils Relative %: 85 %
Platelet Count: 182 10*3/uL (ref 150–400)
RBC: 3.45 MIL/uL — ABNORMAL LOW (ref 3.87–5.11)
RDW: 14.1 % (ref 11.5–15.5)
WBC Count: 4.6 10*3/uL (ref 4.0–10.5)
nRBC: 0 % (ref 0.0–0.2)

## 2019-03-22 MED ORDER — SODIUM CHLORIDE 0.9 % IV SOLN
740.0000 mg | Freq: Once | INTRAVENOUS | Status: AC
  Administered 2019-03-22: 740 mg via INTRAVENOUS
  Filled 2019-03-22: qty 10

## 2019-03-22 MED ORDER — SODIUM CHLORIDE 0.9 % IV SOLN
Freq: Once | INTRAVENOUS | Status: AC
  Administered 2019-03-22: 12:00:00 via INTRAVENOUS
  Filled 2019-03-22: qty 250

## 2019-03-22 NOTE — Progress Notes (Signed)
[facility] Telephone:([phone]) [phone]   Fax:(336) [phone]  OFFICE PROGRESS NOTE  [name], MD [place] [address]  DIAGNOSIS: Stage IIIB (T3,N3, M0)non-small cell lung cancer, squamous cell carcinoma presented with large right middle lobe lung breast in addition to mediastinal and bilateral hilar lymphadenopathy and suspicious right supraclavicular lymph node diagnosed in August 2019.   PRIOR THERAPY: A course of concurrent chemoradiation with weekly carboplatin for AUC of 2 and paclitaxel 45 MG/M2.  First dose of chemotherapy given on 05/29/2018.  Status post 5 cycles.  CURRENT THERAPY:  Consolidation treatment with immunotherapy with Imfinzi 10 mg/KG every 2 weeks.  First dose August 10, 2018.  Status post 16 cycles.  INTERVAL HISTORY: Jenna [name] 73 y.o. female returns to the clinic today for follow-up visit.  The patient is feeling fine today with no concerning complaints except for mild fatigue.  Her fatigue is much better than before.  She denied having any chest pain, shortness of breath, cough or hemoptysis.  She denied having any fever or chills.  She has no nausea, vomiting, diarrhea or constipation.  She denied having any headache or visual changes.  She is here today for evaluation before starting cycle #17.  MEDICAL HISTORY: Past Medical History:  Diagnosis Date  . Blood transfusion without reported diagnosis   . Cataract   . DM (diabetes mellitus) ([place])   . GERD (gastroesophageal reflux disease)   . Hyperlipidemia   . Hypertension   . Iron deficiency anemia   . NSCL ca dx'd 03/2018  . Thyroid disease     ALLERGIES:  is allergic to paclitaxel; aspirin; esomeprazole magnesium; and ace inhibitors.  MEDICATIONS:  Current Outpatient Medications  Medication Sig Dispense Refill  . dexlansoprazole (DEXILANT) 60 MG capsule Take 1qam ac 90 capsule 3  . diphenhydrAMINE (BENADRYL) 2 % cream Apply 1 application  topically as needed for itching.    . docusate sodium (COLACE) 100 MG capsule Take 100 mg by mouth daily as needed for mild constipation.    . ferrous sulfate 325 (65 FE) MG tablet Take 1 tablet (325 mg total) by mouth 2 (two) times daily with a meal. (Patient taking differently: Take 325 mg by mouth 2 (two) times daily with a meal. Pt reports she takes 1 tab 3 times a day) 180 tablet 1  . glipiZIDE (GLUCOTROL) 10 MG tablet Take 1 tablet (10 mg total) by mouth 2 (two) times daily before a meal. 180 tablet 1  . labetalol (NORMODYNE) 100 MG tablet Take 1 tablet (100 mg total) by mouth 2 (two) times daily. 180 tablet 0  . levothyroxine (SYNTHROID) 75 MCG tablet Take 1 tablet (75 mcg total) by mouth daily before breakfast. 30 tablet 2  . Multiple Vitamin (MULTIVITAMIN WITH MINERALS) TABS tablet Take 1 tablet by mouth at bedtime.     . prochlorperazine (COMPAZINE) 10 MG tablet Take 1 tablet (10 mg total) by mouth every 6 (six) hours as needed for nausea or vomiting. 30 tablet 0  . simvastatin (ZOCOR) 20 MG tablet Take 1 tablet (20 mg total) by mouth every evening. 90 tablet 1  . sucralfate (CARAFATE) 1 g tablet Take 1 tablet (1 g total) by mouth 4 (four) times daily. (Patient not taking: Reported on 02/08/2019) 120 tablet 2   No current facility-administered medications for this visit.    Facility-Administered Medications Ordered in Other Visits  Medication Dose Route Frequency Provider Last Rate Last Dose  .  promethazine (PHENERGAN) injection 12.5 mg  12.5 mg Intravenous Once Harle Stanford., PA-C      . Tbo-Filgrastim (GRANIX) injection 300 mcg  300 mcg Subcutaneous Once Curt Bears, MD        SURGICAL HISTORY:  Past Surgical History:  Procedure Laterality Date  . CATARACT EXTRACTION Right   . CHOLECYSTECTOMY    . COLONOSCOPY  10/24/2009   normal rectum/1X1cm abnormal lesion in the ascending colon (bx benign). TI normal for 10cm.  Prep difficult/inadequate. f/u TCS 09/2012 recommended  .  COLONOSCOPY  10/13/2004   Normal rectum/Diminutive polyps, splenic flexure, cold biopsied/removed.  Remainder of colonic mucosa appeared normal.  . COLONOSCOPY N/A 12/14/2012   XBM:[id number] polyp-tubular adenoma  . ESOPHAGOGASTRODUODENOSCOPY  10/13/2004    Normal esophagus/ Nodular volcano like lesion in the antrum, either representing a  pancreatic rest or leiomyoma, biopsied.  Remainder of the gastric mucosa appeared normal, normal D1-D2  . ESOPHAGOGASTRODUODENOSCOPY  10/24/2009   Benign biopsies. normal esophagus/small hiatal hernia/nodular lesion antrum/distal greater curvature. duodenal AVM s/p ablation  . GIVENS CAPSULE STUDY  07/27/2010    multiple arteriovenous malformations which could definitely be the contributor to her drifting hemoglobin and hematocrit  . TUBAL LIGATION    . VIDEO BRONCHOSCOPY WITH ENDOBRONCHIAL ULTRASOUND N/A 04/27/2018   Procedure: VIDEO BRONCHOSCOPY WITH ENDOBRONCHIAL ULTRASOUND;  Surgeon: Melrose Nakayama, MD;  Location: [facility] OR;  Service: Thoracic;  Laterality: N/A;    REVIEW OF SYSTEMS:  A comprehensive review of systems was negative except for: Constitutional: positive for fatigue   PHYSICAL EXAMINATION: General appearance: alert, cooperative, fatigued and no distress Head: Normocephalic, without obvious abnormality, atraumatic Neck: no adenopathy, no JVD, supple, symmetrical, trachea midline and thyroid not enlarged, symmetric, no tenderness/mass/nodules Lymph nodes: Cervical, supraclavicular, and axillary nodes normal. Resp: clear to auscultation bilaterally Back: symmetric, no curvature. ROM normal. No CVA tenderness. Cardio: regular rate and rhythm, S1, S2 normal, no murmur, click, rub or gallop GI: soft, non-tender; bowel sounds normal; no masses,  no organomegaly Extremities: extremities normal, atraumatic, no cyanosis or edema  ECOG PERFORMANCE STATUS: 1 - Symptomatic but completely ambulatory  Blood pressure (!) 148/87, pulse 91, temperature  98.7 F (37.1 C), temperature source Oral, resp. rate 20, height 5\' 5"  (1.651 m), weight 176 lb 3.2 oz (79.9 kg), SpO2 100 %.  LABORATORY DATA: Lab Results  Component Value Date   WBC 4.4 03/08/2019   HGB 9.6 (L) 03/08/2019   HCT 30.8 (L) 03/08/2019   MCV 91.4 03/08/2019   PLT 184 03/08/2019      Chemistry      Component Value Date/Time   NA 135 03/08/2019 1047   K 5.3 (H) 03/08/2019 1047   CL 104 03/08/2019 1047   CO2 20 (L) 03/08/2019 1047   BUN 24 (H) 03/08/2019 1047   BUN 12 12/10/2011 0919   CREATININE 1.46 (H) 03/08/2019 1047   CREATININE 0.72 12/10/2011 0919      Component Value Date/Time   CALCIUM 9.4 03/08/2019 1047   ALKPHOS 106 03/08/2019 1047   ALKPHOS 88 12/10/2011 0919   AST 25 03/08/2019 1047   ALT 22 03/08/2019 1047   BILITOT 0.2 (L) 03/08/2019 1047       RADIOGRAPHIC STUDIES: No results found.  ASSESSMENT AND PLAN: This is a very pleasant 73 years old African-American female recently diagnosed with a stage IIIB non-small cell lung cancer, squamous cell carcinoma.  She completed a course of concurrent chemoradiation with weekly carboplatin and paclitaxel status post 5 cycles with partial response.  She tolerated this treatment well except for the pancytopenia and fatigue.  The patient is currently undergoing treatment with consolidation immunotherapy with Imfinzi status post 16 cycles. The patient continues to tolerate her treatment well with no concerning adverse effects. I recommended for her to proceed with cycle #17 today as planned. I will see her back for follow-up visit in 2 weeks for evaluation before starting cycle #18. For the iron deficiency anemia, she will continue on oral iron tablets for now. For the hypertension she was advised to take her blood pressure medication and monitor her blood pressure closely at home. She was advised to call immediately if she has any concerning symptoms in the interval. The patient voices understanding of  current disease status and treatment options and is in agreement with the current care plan. All questions were answered. The patient knows to call the clinic with any problems, questions or concerns. We can certainly see the patient much sooner if necessary.  Disclaimer: This note was dictated with voice recognition software. Similar sounding words can inadvertently be transcribed and may not be corrected upon review.

## 2019-03-22 NOTE — Patient Instructions (Signed)
[facility] Discharge Instructions for Patients Receiving Chemotherapy  Today you received the following chemotherapy agents Durvalumab (IMFINZI).  To help prevent nausea and vomiting after your treatment, we encourage you to take your nausea medication as prescribed.  If you develop nausea and vomiting that is not controlled by your nausea medication, call the clinic.   BELOW ARE SYMPTOMS THAT SHOULD BE REPORTED IMMEDIATELY:  *FEVER GREATER THAN 100.5 F  *CHILLS WITH OR WITHOUT FEVER  NAUSEA AND VOMITING THAT IS NOT CONTROLLED WITH YOUR NAUSEA MEDICATION  *UNUSUAL SHORTNESS OF BREATH  *UNUSUAL BRUISING OR BLEEDING  TENDERNESS IN MOUTH AND THROAT WITH OR WITHOUT PRESENCE OF ULCERS  *URINARY PROBLEMS  *BOWEL PROBLEMS  UNUSUAL RASH Items with * indicate a potential emergency and should be followed up as soon as possible.  Feel free to call the clinic should you have any questions or concerns. The clinic phone number is [phone].  Please show the [facility] at check-in to the Emergency Department and triage nurse.  Coronavirus (COVID-19) Are you at risk?  Are you at risk for the Coronavirus (COVID-19)?  To be considered HIGH RISK for Coronavirus (COVID-19), you have to meet the following criteria:  . Traveled to [place], [place], [place], [place] or [place]; or in the [place] to [place], [place], [place], or [place]; and have fever, cough, and shortness of breath within the last 2 weeks of travel OR . Been in close contact with a person diagnosed with COVID-19 within the last 2 weeks and have fever, cough, and shortness of breath . IF YOU DO NOT MEET THESE CRITERIA, YOU ARE CONSIDERED LOW RISK FOR COVID-19.  What to do if you are HIGH RISK for COVID-19?  [name] If you are having a medical emergency, call 911. . Seek medical care right away. Before you go to a doctor's office, urgent care or emergency department, call ahead and tell them  about your recent travel, contact with someone diagnosed with COVID-19, and your symptoms. You should receive instructions from your physician's office regarding next steps of care.  . When you arrive at healthcare provider, tell the healthcare staff immediately you have returned from visiting [place], [place], [place], [place] or [place]; or traveled in the [place] to [place], [place], [place], or [place]; in the last two weeks or you have been in close contact with a person diagnosed with COVID-19 in the last 2 weeks.   . Tell the health care staff about your symptoms: fever, cough and shortness of breath. . After you have been seen by a medical provider, you will be either: o Tested for (COVID-19) and discharged home on quarantine except to seek medical care if symptoms worsen, and asked to  - Stay home and avoid contact with others until you get your results (4-5 days)  - Avoid travel on public transportation if possible (such as bus, train, or airplane) or o Sent to the Emergency Department by EMS for evaluation, COVID-19 testing, and possible admission depending on your condition and test results.  What to do if you are LOW RISK for COVID-19?  Reduce your risk of any infection by using the same precautions used for avoiding the common cold or flu:  [name] Wash your hands often with soap and warm water for at least 20 seconds.  If soap and water are not readily available, use an alcohol-based hand sanitizer with at least 60% alcohol.  . If coughing or sneezing,  cover your mouth and nose by coughing or sneezing into the elbow areas of your shirt or coat, into a tissue or into your sleeve (not your hands). . Avoid shaking hands with others and consider head nods or verbal greetings only. . Avoid touching your eyes, nose, or mouth with unwashed hands.  . Avoid close contact with people who are sick. . Avoid places or events with large numbers of people in one location, like concerts or  sporting events. . Carefully consider travel plans you have or are making. . If you are planning any travel outside or inside the [place], visit the CDC's Travelers' Health webpage for the latest health notices. . If you have some symptoms but not all symptoms, continue to monitor at home and seek medical attention if your symptoms worsen. . If you are having a medical emergency, call 911.   [facility] / e-Visit: [email or web address]         [facility]: [facility]: [phone]                   [facility]: [phone]

## 2019-04-05 ENCOUNTER — Telehealth: Payer: Self-pay | Admitting: Internal Medicine

## 2019-04-05 ENCOUNTER — Other Ambulatory Visit: Payer: Self-pay

## 2019-04-05 ENCOUNTER — Inpatient Hospital Stay: Payer: Medicare Other

## 2019-04-05 ENCOUNTER — Inpatient Hospital Stay (HOSPITAL_BASED_OUTPATIENT_CLINIC_OR_DEPARTMENT_OTHER): Payer: Medicare Other | Admitting: Physician Assistant

## 2019-04-05 VITALS — BP 140/60 | HR 79 | Temp 98.5°F | Resp 20 | Ht 65.0 in | Wt 177.8 lb

## 2019-04-05 DIAGNOSIS — I10 Essential (primary) hypertension: Secondary | ICD-10-CM | POA: Diagnosis not present

## 2019-04-05 DIAGNOSIS — C342 Malignant neoplasm of middle lobe, bronchus or lung: Secondary | ICD-10-CM

## 2019-04-05 DIAGNOSIS — E119 Type 2 diabetes mellitus without complications: Secondary | ICD-10-CM | POA: Diagnosis not present

## 2019-04-05 DIAGNOSIS — Z7984 Long term (current) use of oral hypoglycemic drugs: Secondary | ICD-10-CM | POA: Diagnosis not present

## 2019-04-05 DIAGNOSIS — R5382 Chronic fatigue, unspecified: Secondary | ICD-10-CM

## 2019-04-05 DIAGNOSIS — E785 Hyperlipidemia, unspecified: Secondary | ICD-10-CM | POA: Diagnosis not present

## 2019-04-05 DIAGNOSIS — Z5112 Encounter for antineoplastic immunotherapy: Secondary | ICD-10-CM

## 2019-04-05 DIAGNOSIS — Q2730 Arteriovenous malformation, site unspecified: Secondary | ICD-10-CM | POA: Diagnosis not present

## 2019-04-05 DIAGNOSIS — E1165 Type 2 diabetes mellitus with hyperglycemia: Secondary | ICD-10-CM | POA: Diagnosis not present

## 2019-04-05 DIAGNOSIS — Z79899 Other long term (current) drug therapy: Secondary | ICD-10-CM | POA: Diagnosis not present

## 2019-04-05 DIAGNOSIS — E079 Disorder of thyroid, unspecified: Secondary | ICD-10-CM | POA: Diagnosis not present

## 2019-04-05 LAB — CMP (CANCER CENTER ONLY)
ALT: 19 U/L (ref 0–44)
AST: 21 U/L (ref 15–41)
Albumin: 3.6 g/dL (ref 3.5–5.0)
Alkaline Phosphatase: 111 U/L (ref 38–126)
Anion gap: 10 (ref 5–15)
BUN: 28 mg/dL — ABNORMAL HIGH (ref 8–23)
CO2: 22 mmol/L (ref 22–32)
Calcium: 9.3 mg/dL (ref 8.9–10.3)
Chloride: 103 mmol/L (ref 98–111)
Creatinine: 1.42 mg/dL — ABNORMAL HIGH (ref 0.44–1.00)
GFR, Est AFR Am: 43 mL/min — ABNORMAL LOW (ref >60)
GFR, Estimated: 37 mL/min — ABNORMAL LOW (ref >60)
Glucose, Bld: 352 mg/dL — ABNORMAL HIGH (ref 70–99)
Potassium: 4.7 mmol/L (ref 3.5–5.1)
Sodium: 135 mmol/L (ref 135–145)
Total Bilirubin: 0.2 mg/dL — ABNORMAL LOW (ref 0.3–1.2)
Total Protein: 8.1 g/dL (ref 6.5–8.1)

## 2019-04-05 LAB — CBC WITH DIFFERENTIAL (CANCER CENTER ONLY)
Abs Immature Granulocytes: 0.01 10*3/uL (ref 0.00–0.07)
Basophils Absolute: 0 10*3/uL (ref 0.0–0.1)
Basophils Relative: 0 %
Eosinophils Absolute: 0.1 10*3/uL (ref 0.0–0.5)
Eosinophils Relative: 2 %
HCT: 30.9 % — ABNORMAL LOW (ref 36.0–46.0)
Hemoglobin: 9.6 g/dL — ABNORMAL LOW (ref 12.0–15.0)
Immature Granulocytes: 0 %
Lymphocytes Relative: 8 %
Lymphs Abs: 0.3 10*3/uL — ABNORMAL LOW (ref 0.7–4.0)
MCH: 27.9 pg (ref 26.0–34.0)
MCHC: 31.1 g/dL (ref 30.0–36.0)
MCV: 89.8 fL (ref 80.0–100.0)
Monocytes Absolute: 0.3 10*3/uL (ref 0.1–1.0)
Monocytes Relative: 6 %
Neutro Abs: 3.3 10*3/uL (ref 1.7–7.7)
Neutrophils Relative %: 84 %
Platelet Count: 190 10*3/uL (ref 150–400)
RBC: 3.44 MIL/uL — ABNORMAL LOW (ref 3.87–5.11)
RDW: 13.8 % (ref 11.5–15.5)
WBC Count: 4 10*3/uL (ref 4.0–10.5)
nRBC: 0 % (ref 0.0–0.2)

## 2019-04-05 LAB — TSH: TSH: 2.664 u[IU]/mL (ref 0.308–3.960)

## 2019-04-05 MED ORDER — INSULIN REGULAR HUMAN 100 UNIT/ML IJ SOLN
10.0000 [IU] | Freq: Once | INTRAMUSCULAR | Status: AC
  Administered 2019-04-05: 14:00:00 10 [IU] via SUBCUTANEOUS
  Filled 2019-04-05: qty 10

## 2019-04-05 MED ORDER — SODIUM CHLORIDE 0.9 % IV SOLN
10.8000 mg/kg | Freq: Once | INTRAVENOUS | Status: AC
  Administered 2019-04-05: 740 mg via INTRAVENOUS
  Filled 2019-04-05: qty 4.8

## 2019-04-05 MED ORDER — SODIUM CHLORIDE 0.9 % IV SOLN
Freq: Once | INTRAVENOUS | Status: AC
  Administered 2019-04-05: 14:00:00 via INTRAVENOUS
  Filled 2019-04-05: qty 250

## 2019-04-05 NOTE — Patient Instructions (Signed)
Durvalumab injection What is this medicine? DURVALUMAB (dur VAL ue mab) is a monoclonal antibody. It is used to treat urothelial cancer and lung cancer. This medicine may be used for other purposes; ask your health care provider or pharmacist if you have questions. COMMON BRAND NAME(S): IMFINZI What should I tell my health care provider before I take this medicine? They need to know if you have any of these conditions:  diabetes  immune system problems  infection  inflammatory bowel disease  kidney disease  liver disease  lung or breathing disease  lupus  organ transplant  stomach or intestine problems  thyroid disease  an unusual or allergic reaction to durvalumab, other medicines, foods, dyes, or preservatives  pregnant or trying to get pregnant  breast-feeding How should I use this medicine? This medicine is for infusion into a vein. It is given by a health care professional in a hospital or clinic setting. A special MedGuide will be given to you before each treatment. Be sure to read this information carefully each time. Talk to your pediatrician regarding the use of this medicine in children. Special care may be needed. Overdosage: If you think you have taken too much of this medicine contact a poison control center or emergency room at once. NOTE: This medicine is only for you. Do not share this medicine with others. What if I miss a dose? It is important not to miss your dose. Call your doctor or health care professional if you are unable to keep an appointment. What may interact with this medicine? Interactions have not been studied. This list may not describe all possible interactions. Give your health care provider a list of all the medicines, herbs, non-prescription drugs, or dietary supplements you use. Also tell them if you smoke, drink alcohol, or use illegal drugs. Some items may interact with your medicine. What should I watch for while using this  medicine? This drug may make you feel generally unwell. Continue your course of treatment even though you feel ill unless your doctor tells you to stop. You may need blood work done while you are taking this medicine. Do not become pregnant while taking this medicine or for 3 months after stopping it. Women should inform their doctor if they wish to become pregnant or think they might be pregnant. There is a potential for serious side effects to an unborn child. Talk to your health care professional or pharmacist for more information. Do not breast-feed an infant while taking this medicine or for 3 months after stopping it. What side effects may I notice from receiving this medicine? Side effects that you should report to your doctor or health care professional as soon as possible:  allergic reactions like skin rash, itching or hives, swelling of the face, lips, or tongue  black, tarry stools  bloody or watery diarrhea  breathing problems  change in emotions or moods  change in sex drive  changes in vision  chest pain or chest tightness  chills  confusion  cough  facial flushing  fever  headache  signs and symptoms of high blood sugar such as dizziness; dry mouth; dry skin; fruity breath; nausea; stomach pain; increased hunger or thirst; increased urination  signs and symptoms of liver injury like dark yellow or brown urine; general ill feeling or flu-like symptoms; light-colored stools; loss of appetite; nausea; right upper belly pain; unusually weak or tired; yellowing of the eyes or skin  stomach pain  trouble passing urine or change in   the amount of urine  weight gain or weight loss Side effects that usually do not require medical attention (report these to your doctor or health care professional if they continue or are bothersome):  bone pain  constipation  loss of appetite  muscle pain  nausea  swelling of the ankles, feet, hands  tiredness This list  may not describe all possible side effects. Call your doctor for medical advice about side effects. You may report side effects to FDA at 1-800-FDA-1088. Where should I keep my medicine? This drug is given in a hospital or clinic and will not be stored at home. NOTE: This sheet is a summary. It may not cover all possible information. If you have questions about this medicine, talk to your doctor, pharmacist, or health care provider.  2020 Elsevier/Gold Standard (2016-10-12 19:25:04)  Coronavirus (COVID-19) Are you at risk?  Are you at risk for the Coronavirus (COVID-19)?  To be considered HIGH RISK for Coronavirus (COVID-19), you have to meet the following criteria:  . Traveled to [place], [place], [place], [place] or [place]; or in the [place] to [place], [place], [place], or [place]; and have fever, cough, and shortness of breath within the last 2 weeks of travel OR . Been in close contact with a person diagnosed with COVID-19 within the last 2 weeks and have fever, cough, and shortness of breath . IF YOU DO NOT MEET THESE CRITERIA, YOU ARE CONSIDERED LOW RISK FOR COVID-19.  What to do if you are HIGH RISK for COVID-19?  . If you are having a medical emergency, call 911. . Seek medical care right away. Before you go to a doctor's office, urgent care or emergency department, call ahead and tell them about your recent travel, contact with someone diagnosed with COVID-19, and your symptoms. You should receive instructions from your physician's office regarding next steps of care.  . When you arrive at healthcare provider, tell the healthcare staff immediately you have returned from visiting [place], [place], [place], [place] or [place]; or traveled in the [place] to [place], [place], [place], or [place]; in the last two weeks or you have been in close contact with a person diagnosed with COVID-19 in the last 2 weeks.   . Tell the health care staff about your symptoms:  fever, cough and shortness of breath. . After you have been seen by a medical provider, you will be either: o Tested for (COVID-19) and discharged home on quarantine except to seek medical care if symptoms worsen, and asked to  - Stay home and avoid contact with others until you get your results (4-5 days)  - Avoid travel on public transportation if possible (such as bus, train, or airplane) or o Sent to the Emergency Department by EMS for evaluation, COVID-19 testing, and possible admission depending on your condition and test results.  What to do if you are LOW RISK for COVID-19?  Reduce your risk of any infection by using the same precautions used for avoiding the common cold or flu:  . Wash your hands often with soap and warm water for at least 20 seconds.  If soap and water are not readily available, use an alcohol-based hand sanitizer with at least 60% alcohol.  . If coughing or sneezing, cover your mouth and nose by coughing or sneezing into the elbow areas of your shirt or coat, into a tissue or into your sleeve (not your hands). . Avoid shaking hands with others   and consider head nods or verbal greetings only. . Avoid touching your eyes, nose, or mouth with unwashed hands.  . Avoid close contact with people who are sick. . Avoid places or events with large numbers of people in one location, like concerts or sporting events. . Carefully consider travel plans you have or are making. . If you are planning any travel outside or inside the [place], visit the CDC's Travelers' Health webpage for the latest health notices. . If you have some symptoms but not all symptoms, continue to monitor at home and seek medical attention if your symptoms worsen. . If you are having a medical emergency, call 911.   ADDITIONAL HEALTHCARE OPTIONS FOR PATIENTS  [facility] / e-Visit: [email or web address]         [facility]: [phone]    Urgent Care: [phone]                   [facility]: [phone]   

## 2019-04-05 NOTE — Patient Instructions (Addendum)
[phone] -->Radiology scheduling Hyperglycemia Hyperglycemia is when the sugar (glucose) level in your blood is too high. It may not cause symptoms. If you do have symptoms, they may include warning signs, such as:  Feeling more thirsty than normal.  Hunger.  Feeling tired.  Needing to pee (urinate) more than normal.  Blurry eyesight (vision). You may get other symptoms as it gets worse, such as:  Dry mouth.  Not being hungry (loss of appetite).  Fruity-smelling breath.  Weakness.  Weight gain or loss that is not planned. Weight loss may be fast.  A tingling or numb feeling in your hands or feet.  Headache.  Skin that does not bounce back quickly when it is lightly pinched and released (poor skin turgor).  Pain in your belly (abdomen).  Cuts or bruises that heal slowly. High blood sugar can happen to people who do or do not have diabetes. High blood sugar can happen slowly or quickly, and it can be an emergency. Follow these instructions at home: General instructions  Take over-the-counter and prescription medicines only as told by your doctor.  Do not use products that contain nicotine or tobacco, such as cigarettes and e-cigarettes. If you need help quitting, ask your doctor.  Limit alcohol intake to no more than 1 drink per day for nonpregnant women and 2 drinks per day for men. One drink equals 12 oz of beer, 5 oz of wine, or 1 oz of hard liquor.  Manage stress. If you need help with this, ask your doctor.  Keep all follow-up visits as told by your doctor. This is important. Eating and drinking   Stay at a healthy weight.  Exercise regularly, as told by your doctor.  Drink enough fluid, especially when you: ? Exercise. ? Get sick. ? Are in hot temperatures.  Eat healthy foods, such as: ? Low-fat (lean) proteins. ? Complex carbs (complex carbohydrates), such as whole wheat bread or brown rice. ? Fresh fruits and vegetables. ? Low-fat dairy  products. ? Healthy fats.  Drink enough fluid to keep your pee (urine) clear or pale yellow. If you have diabetes:   Make sure you know the symptoms of hyperglycemia.  Follow your diabetes management plan, as told by your doctor. Make sure you: ? Take insulin and medicines as told. ? Follow your exercise plan. ? Follow your meal plan. Eat on time. Do not skip meals. ? Check your blood sugar as often as told. Make sure to check before and after exercise. If you exercise longer or in a different way than you normally do, check your blood sugar more often. ? Follow your sick day plan whenever you cannot eat or drink normally. Make this plan ahead of time with your doctor.  Share your diabetes management plan with people in your workplace, school, and household.  Check your urine for ketones when you are ill and as told by your doctor.  Carry a card or wear jewelry that says that you have diabetes. Contact a doctor if:  Your blood sugar level is higher than 240 mg/dL (13.3 mmol/L) for 2 days in a row.  You have problems keeping your blood sugar in your target range.  High blood sugar happens often for you. Get help right away if:  You have trouble breathing.  You have a change in how you think, feel, or act (mental status).  You feel sick to your stomach (nauseous), and that feeling does not go away.  You cannot stop throwing up (vomiting).  These symptoms may be an emergency. Do not wait to see if the symptoms will go away. Get medical help right away. Call your local emergency services (911 in the [place] Do not drive yourself to the hospital. Summary  Hyperglycemia is when the sugar (glucose) level in your blood is too high.  High blood sugar can happen to people who do or do not have diabetes.  Make sure you drink enough fluids, eat healthy foods, and exercise regularly.  Contact your doctor if you have problems keeping your blood sugar in your target range. This  information is not intended to replace advice given to you by your health care provider. Make sure you discuss any questions you have with your health care provider. Document Released: 05/30/2009 Document Revised: 04/19/2016 Document Reviewed: 04/19/2016 Elsevier Patient Education  2020 [employer].

## 2019-04-05 NOTE — Progress Notes (Signed)
CBG rechecked 1 hour after insulin administered and was 287. Cassie Heilingoetter-PA updated on results

## 2019-04-05 NOTE — Progress Notes (Signed)
[facility] OFFICE PROGRESS NOTE  Jenna Passy, MD [place] [address]  DIAGNOSIS: Stage IIIB (T3,N3, M0)non-small cell lung cancer, squamous cell carcinoma presented with large right middle lobe lung mass in addition to mediastinal and bilateral hilar lymphadenopathy and suspicious right supraclavicular lymph node diagnosed in August 2019.  PRIOR THERAPY: Acourse of concurrent chemoradiation with weekly carboplatin for AUC of 2 and paclitaxel 45 MG/M2.First dose of chemotherapy given on 05/29/2018. Status post 5 cycles.  CURRENT THERAPY: Consolidation treatment with immunotherapy with Imfinzi 10 mg/KG every 2 weeks. First dose August 10, 2018. Status post 17cycles.  INTERVAL HISTORY: Jenna [name] 73 y.o. female returns to the clinic for a follow-up visit.  The patient is feeling well today without any concerning complaints.  She continues to tolerate her treatment well without any adverse side effects.  She denies any fever, chills, night sweats, or weight loss.  She denies any chest pain, shortness of breath, cough, or hemoptysis.  She denies any nausea, vomiting, diarrhea, abdominal pain, or constipation.  She denies any headache or visual changes.  She denies any rashes or skin changes.  She denies any polydipsia, polyuria, polyphagia, or mental status changes. The patient has diabetes and frequently has hyperglycemia on routine labs. She states she takes her medication, glipizide, as prescribed.  Here today for evaluation before starting cycle #18.  MEDICAL HISTORY: Past Medical History:  Diagnosis Date  . Blood transfusion without reported diagnosis   . Cataract   . DM (diabetes mellitus) ([place])   . GERD (gastroesophageal reflux disease)   . Hyperlipidemia   . Hypertension   . Iron deficiency anemia   . NSCL ca dx'd 03/2018  . Thyroid disease     ALLERGIES:  is allergic to paclitaxel; aspirin; esomeprazole magnesium; and ace  inhibitors.  MEDICATIONS:  Current Outpatient Medications  Medication Sig Dispense Refill  . dexlansoprazole (DEXILANT) 60 MG capsule Take 1qam ac 90 capsule 3  . diphenhydrAMINE (BENADRYL) 2 % cream Apply 1 application topically as needed for itching.    . docusate sodium (COLACE) 100 MG capsule Take 100 mg by mouth daily as needed for mild constipation.    . ferrous sulfate 325 (65 FE) MG tablet Take 1 tablet (325 mg total) by mouth 2 (two) times daily with a meal. (Patient taking differently: Take 325 mg by mouth 2 (two) times daily with a meal. Pt reports she takes 1 tab 3 times a day) 180 tablet 1  . glipiZIDE (GLUCOTROL) 10 MG tablet Take 1 tablet (10 mg total) by mouth 2 (two) times daily before a meal. 180 tablet 1  . labetalol (NORMODYNE) 100 MG tablet Take 1 tablet (100 mg total) by mouth 2 (two) times daily. 180 tablet 0  . levothyroxine (SYNTHROID) 75 MCG tablet Take 1 tablet (75 mcg total) by mouth daily before breakfast. 30 tablet 2  . Multiple Vitamin (MULTIVITAMIN WITH MINERALS) TABS tablet Take 1 tablet by mouth at bedtime.     . prochlorperazine (COMPAZINE) 10 MG tablet Take 1 tablet (10 mg total) by mouth every 6 (six) hours as needed for nausea or vomiting. 30 tablet 0  . simvastatin (ZOCOR) 20 MG tablet Take 1 tablet (20 mg total) by mouth every evening. 90 tablet 1  . sucralfate (CARAFATE) 1 g tablet Take 1 tablet (1 g total) by mouth 4 (four) times daily. 120 tablet 2   Current Facility-Administered Medications  Medication Dose Route Frequency Provider Last Rate Last Dose  .  insulin regular (NOVOLIN R) 100 units/mL injection 10 Units  10 Units Subcutaneous Once Heilingoetter, Cassandra L, PA-C       Facility-Administered Medications Ordered in Other Visits  Medication Dose Route Frequency Provider Last Rate Last Dose  . promethazine (PHENERGAN) injection 12.5 mg  12.5 mg Intravenous Once Harle Stanford., PA-C      . Tbo-Filgrastim (GRANIX) injection 300 mcg  300 mcg  Subcutaneous Once Curt Bears, MD        SURGICAL HISTORY:  Past Surgical History:  Procedure Laterality Date  . CATARACT EXTRACTION Right   . CHOLECYSTECTOMY    . COLONOSCOPY  10/24/2009   normal rectum/1X1cm abnormal lesion in the ascending colon (bx benign). TI normal for 10cm.  Prep difficult/inadequate. f/u TCS 09/2012 recommended  . COLONOSCOPY  10/13/2004   Normal rectum/Diminutive polyps, splenic flexure, cold biopsied/removed.  Remainder of colonic mucosa appeared normal.  . COLONOSCOPY N/A 12/14/2012   CNO:[id number] polyp-tubular adenoma  . ESOPHAGOGASTRODUODENOSCOPY  10/13/2004    Normal esophagus/ Nodular volcano like lesion in the antrum, either representing a  pancreatic rest or leiomyoma, biopsied.  Remainder of the gastric mucosa appeared normal, normal D1-D2  . ESOPHAGOGASTRODUODENOSCOPY  10/24/2009   Benign biopsies. normal esophagus/small hiatal hernia/nodular lesion antrum/distal greater curvature. duodenal AVM s/p ablation  . GIVENS CAPSULE STUDY  07/27/2010    multiple arteriovenous malformations which could definitely be the contributor to her drifting hemoglobin and hematocrit  . TUBAL LIGATION    . VIDEO BRONCHOSCOPY WITH ENDOBRONCHIAL ULTRASOUND N/A 04/27/2018   Procedure: VIDEO BRONCHOSCOPY WITH ENDOBRONCHIAL ULTRASOUND;  Surgeon: Melrose Nakayama, MD;  Location: [facility] OR;  Service: Thoracic;  Laterality: N/A;    REVIEW OF SYSTEMS:   Review of Systems  Constitutional: Negative for appetite change, chills, fatigue, fever and unexpected weight change.  HENT: Negative for mouth sores, nosebleeds, sore throat and trouble swallowing.   Eyes: Negative for eye problems and icterus.  Respiratory: Negative for cough, hemoptysis, shortness of breath and wheezing.   Cardiovascular: Negative for chest pain and leg swelling.  Gastrointestinal: Negative for abdominal pain, constipation, diarrhea, nausea and vomiting.  Genitourinary: Negative for bladder incontinence,  difficulty urinating, dysuria, frequency and hematuria.   Musculoskeletal: Negative for back pain, gait problem, neck pain and neck stiffness.  Skin: Negative for itching and rash.  Neurological: Negative for dizziness, extremity weakness, gait problem, headaches, light-headedness and seizures.  Hematological: Negative for adenopathy. Does not bruise/bleed easily.  Psychiatric/Behavioral: Negative for confusion, depression and sleep disturbance. The patient is not nervous/anxious.     PHYSICAL EXAMINATION:  Blood pressure 140/60, pulse 79, temperature 98.5 F (36.9 C), temperature source Oral, resp. rate 20, height 5\' 5"  (1.651 m), weight 177 lb 12.8 oz (80.6 kg), SpO2 100 %.  ECOG PERFORMANCE STATUS: 1 - Symptomatic but completely ambulatory  Physical Exam  Constitutional: Oriented to person, place, and time and well-developed, well-nourished, and in no distress.  HENT:  Head: Normocephalic and atraumatic.  Mouth/Throat: Oropharynx is clear and moist. No oropharyngeal exudate.  Eyes: Conjunctivae are normal. Right eye exhibits no discharge. Left eye exhibits no discharge. No scleral icterus.  Neck: Normal range of motion. Neck supple.  Cardiovascular: Normal rate, regular rhythm, normal heart sounds and intact distal pulses.   Pulmonary/Chest: Effort normal and breath sounds normal. No respiratory distress. No wheezes. No rales.  Abdominal: Soft. Bowel sounds are normal. Exhibits no distension and no mass. There is no tenderness.  Musculoskeletal: Normal range of motion. Exhibits no edema.  Lymphadenopathy:  No cervical adenopathy.  Neurological: Alert and oriented to person, place, and time. Exhibits normal muscle tone. Gait normal. Coordination normal.  Skin: Skin is warm and dry. No rash noted. Not diaphoretic. No erythema. No pallor.  Psychiatric: Mood, memory and judgment normal.  Vitals reviewed.  LABORATORY DATA: Lab Results  Component Value Date   WBC 4.0 04/05/2019    HGB 9.6 (L) 04/05/2019   HCT 30.9 (L) 04/05/2019   MCV 89.8 04/05/2019   PLT 190 04/05/2019      Chemistry      Component Value Date/Time   NA 135 04/05/2019 1212   K 4.7 04/05/2019 1212   CL 103 04/05/2019 1212   CO2 22 04/05/2019 1212   BUN 28 (H) 04/05/2019 1212   BUN 12 12/10/2011 0919   CREATININE 1.42 (H) 04/05/2019 1212   CREATININE 0.72 12/10/2011 0919      Component Value Date/Time   CALCIUM 9.3 04/05/2019 1212   ALKPHOS 111 04/05/2019 1212   ALKPHOS 88 12/10/2011 0919   AST 21 04/05/2019 1212   ALT 19 04/05/2019 1212   BILITOT 0.2 (L) 04/05/2019 1212       RADIOGRAPHIC STUDIES:  No results found.   ASSESSMENT/PLAN:  This is a very pleasant 73 year old African-American female diagnosed with stage IIIb non-small cell lung cancer, squamous cell carcinoma.  She presented with a large right middle lobe lung mass in addition to mediastinal and bilateral hilar lymphadenopathy.  She also has suspicious right supraclavicular lymphadenopathy.  She was diagnosed in August 2019.  She previously underwent concurrent chemoradiation with carboplatin and paclitaxel.  She is status post 5 cycles with a partial response.  She tolerated treatment fairly well except for pancytopenia and fatigue.  The patient is currently undergoing consolidation immunotherapy with Imfinzi 10 mg/kg IV every 2 weeks.  She is status post 17 cycles.  She has been tolerating it well without any adverse effects.  I recommend that she proceed with cycle #18 today as scheduled.  I will arrange for the patient to have a restaging CT scan of the chest performed prior to her next visit.   We will see the patient back for a follow up visit in 2 weeks for evaluation and to review her scan results.   I have reviewed the patients labs with her today. Her blood sugar is elevated today at 352 today. She is asymptomatic. I spent some time counseling the patient of the importance of control of her blood sugars. I  reinforced the warning symptoms of hyper/hypoglycemia with the patient which warrant immediate medical attention. I also strongly encouraged the patient to get a glucometer and to follow up with her PCP regarding their recommendations for further management of her diabetes. She was given 10 units of insulin while in the clinic today.   The patient was advised to call immediately if she has any concerning symptoms in the interval. The patient voices understanding of current disease status and treatment options and is in agreement with the current care plan. All questions were answered. The patient knows to call the clinic with any problems, questions or concerns. We can certainly see the patient much sooner if necessary  Orders Placed This Encounter  Procedures  . CT Chest W Contrast    Standing Status:   Future    Standing Expiration Date:   04/04/2020    Order Specific Question:   ** REASON FOR EXAM (FREE TEXT)    Answer:   Restaging Lung Cancer  Order Specific Question:   If indicated for the ordered procedure, I authorize the administration of contrast media per Radiology protocol    Answer:   Yes    Order Specific Question:   Preferred imaging location?    Answer:   [facility]    Order Specific Question:   Radiology Contrast Protocol - do NOT remove file path    Answer:   [email or web address]  . Iron and TIBC    Standing Status:   Future    Standing Expiration Date:   04/04/2020  . Ferritin    Standing Status:   Future    Standing Expiration Date:   04/04/2020     Tobe Sos Heilingoetter, PA-C 04/05/19

## 2019-04-05 NOTE — Telephone Encounter (Signed)
Added additional cycles per 8/20 los - pt to get an updated schedule next visit.

## 2019-04-06 LAB — GLUCOSE, CAPILLARY: Glucose-Capillary: 287 mg/dL — ABNORMAL HIGH (ref 70–99)

## 2019-04-16 ENCOUNTER — Ambulatory Visit (HOSPITAL_COMMUNITY)
Admission: RE | Admit: 2019-04-16 | Discharge: 2019-04-16 | Disposition: A | Discharge status: Home / Self Care | Payer: Medicare Other | Source: Ambulatory Visit | Attending: Physician Assistant | Admitting: Physician Assistant

## 2019-04-16 ENCOUNTER — Other Ambulatory Visit: Payer: Self-pay

## 2019-04-16 DIAGNOSIS — C342 Malignant neoplasm of middle lobe, bronchus or lung: Secondary | ICD-10-CM | POA: Diagnosis not present

## 2019-04-16 DIAGNOSIS — C3490 Malignant neoplasm of unspecified part of unspecified bronchus or lung: Secondary | ICD-10-CM | POA: Diagnosis not present

## 2019-04-16 MED ORDER — IOHEXOL 300 MG/ML  SOLN
75.0000 mL | Freq: Once | INTRAMUSCULAR | Status: AC | PRN
  Administered 2019-04-16: 10:00:00 60 mL via INTRAVENOUS

## 2019-04-16 MED ORDER — SODIUM CHLORIDE (PF) 0.9 % IJ SOLN
INTRAMUSCULAR | Status: AC
  Filled 2019-04-16: qty 50

## 2019-04-19 ENCOUNTER — Inpatient Hospital Stay (HOSPITAL_BASED_OUTPATIENT_CLINIC_OR_DEPARTMENT_OTHER): Payer: Medicare Other | Admitting: Internal Medicine

## 2019-04-19 ENCOUNTER — Inpatient Hospital Stay: Payer: Medicare Other | Attending: Internal Medicine

## 2019-04-19 ENCOUNTER — Inpatient Hospital Stay: Payer: Medicare Other

## 2019-04-19 ENCOUNTER — Other Ambulatory Visit: Payer: Self-pay

## 2019-04-19 ENCOUNTER — Encounter: Payer: Self-pay | Admitting: Internal Medicine

## 2019-04-19 VITALS — BP 151/65 | HR 99 | Temp 98.5°F | Resp 20 | Ht 65.0 in | Wt 176.2 lb

## 2019-04-19 DIAGNOSIS — Z79899 Other long term (current) drug therapy: Secondary | ICD-10-CM | POA: Diagnosis not present

## 2019-04-19 DIAGNOSIS — I10 Essential (primary) hypertension: Secondary | ICD-10-CM | POA: Insufficient documentation

## 2019-04-19 DIAGNOSIS — D61818 Other pancytopenia: Secondary | ICD-10-CM | POA: Diagnosis not present

## 2019-04-19 DIAGNOSIS — E785 Hyperlipidemia, unspecified: Secondary | ICD-10-CM | POA: Insufficient documentation

## 2019-04-19 DIAGNOSIS — C342 Malignant neoplasm of middle lobe, bronchus or lung: Secondary | ICD-10-CM | POA: Diagnosis not present

## 2019-04-19 DIAGNOSIS — E079 Disorder of thyroid, unspecified: Secondary | ICD-10-CM | POA: Insufficient documentation

## 2019-04-19 DIAGNOSIS — Z5112 Encounter for antineoplastic immunotherapy: Secondary | ICD-10-CM | POA: Insufficient documentation

## 2019-04-19 DIAGNOSIS — Q2730 Arteriovenous malformation, site unspecified: Secondary | ICD-10-CM

## 2019-04-19 DIAGNOSIS — Z7984 Long term (current) use of oral hypoglycemic drugs: Secondary | ICD-10-CM | POA: Diagnosis not present

## 2019-04-19 DIAGNOSIS — E119 Type 2 diabetes mellitus without complications: Secondary | ICD-10-CM | POA: Insufficient documentation

## 2019-04-19 LAB — CBC WITH DIFFERENTIAL (CANCER CENTER ONLY)
Abs Immature Granulocytes: 0.02 10*3/uL (ref 0.00–0.07)
Basophils Absolute: 0 10*3/uL (ref 0.0–0.1)
Basophils Relative: 1 %
Eosinophils Absolute: 0.1 10*3/uL (ref 0.0–0.5)
Eosinophils Relative: 2 %
HCT: 30.2 % — ABNORMAL LOW (ref 36.0–46.0)
Hemoglobin: 9.5 g/dL — ABNORMAL LOW (ref 12.0–15.0)
Immature Granulocytes: 1 %
Lymphocytes Relative: 10 %
Lymphs Abs: 0.4 10*3/uL — ABNORMAL LOW (ref 0.7–4.0)
MCH: 27.7 pg (ref 26.0–34.0)
MCHC: 31.5 g/dL (ref 30.0–36.0)
MCV: 88 fL (ref 80.0–100.0)
Monocytes Absolute: 0.2 10*3/uL (ref 0.1–1.0)
Monocytes Relative: 6 %
Neutro Abs: 3.1 10*3/uL (ref 1.7–7.7)
Neutrophils Relative %: 80 %
Platelet Count: 187 10*3/uL (ref 150–400)
RBC: 3.43 MIL/uL — ABNORMAL LOW (ref 3.87–5.11)
RDW: 14 % (ref 11.5–15.5)
WBC Count: 3.8 10*3/uL — ABNORMAL LOW (ref 4.0–10.5)
nRBC: 0 % (ref 0.0–0.2)

## 2019-04-19 LAB — IRON AND TIBC
Iron: 112 ug/dL (ref 41–142)
Saturation Ratios: 39 % (ref 21–57)
TIBC: 289 ug/dL (ref 236–444)
UIBC: 176 ug/dL (ref 120–384)

## 2019-04-19 LAB — CMP (CANCER CENTER ONLY)
ALT: 14 U/L (ref 0–44)
AST: 18 U/L (ref 15–41)
Albumin: 3.6 g/dL (ref 3.5–5.0)
Alkaline Phosphatase: 93 U/L (ref 38–126)
Anion gap: 8 (ref 5–15)
BUN: 21 mg/dL (ref 8–23)
CO2: 22 mmol/L (ref 22–32)
Calcium: 9.2 mg/dL (ref 8.9–10.3)
Chloride: 105 mmol/L (ref 98–111)
Creatinine: 1.35 mg/dL — ABNORMAL HIGH (ref 0.44–1.00)
GFR, Est AFR Am: 45 mL/min — ABNORMAL LOW (ref >60)
GFR, Estimated: 39 mL/min — ABNORMAL LOW (ref >60)
Glucose, Bld: 236 mg/dL — ABNORMAL HIGH (ref 70–99)
Potassium: 4 mmol/L (ref 3.5–5.1)
Sodium: 135 mmol/L (ref 135–145)
Total Bilirubin: 0.2 mg/dL — ABNORMAL LOW (ref 0.3–1.2)
Total Protein: 8 g/dL (ref 6.5–8.1)

## 2019-04-19 LAB — FERRITIN: Ferritin: 51 ng/mL (ref 11–307)

## 2019-04-19 MED ORDER — SODIUM CHLORIDE 0.9 % IV SOLN
10.8000 mg/kg | Freq: Once | INTRAVENOUS | Status: AC
  Administered 2019-04-19: 740 mg via INTRAVENOUS
  Filled 2019-04-19: qty 4.8

## 2019-04-19 MED ORDER — SODIUM CHLORIDE 0.9 % IV SOLN
Freq: Once | INTRAVENOUS | Status: AC
  Administered 2019-04-19: 15:00:00 via INTRAVENOUS
  Filled 2019-04-19: qty 250

## 2019-04-19 NOTE — Progress Notes (Signed)
Jenna [name] [phone]   [phone]  OFFICE PROGRESS NOTE  [name], MD [place] [address]  DIAGNOSIS: Stage IIIB (T3,N3, M0)non-small cell lung cancer, squamous cell carcinoma presented with large right middle lobe lung breast in addition to mediastinal and bilateral hilar lymphadenopathy and suspicious right supraclavicular lymph node diagnosed in August 2019.   PRIOR THERAPY: A course of concurrent chemoradiation with weekly carboplatin for AUC of 2 and paclitaxel 45 MG/M2.  First dose of chemotherapy given on 05/29/2018.  Status post 5 cycles.  CURRENT THERAPY:  Consolidation treatment with immunotherapy with Imfinzi 10 mg/KG every 2 weeks.  First dose August 10, 2018.  Status post 18 cycles.  INTERVAL HISTORY: Jenna [name] 73 y.o. female returns to the clinic today for follow-up visit.  The patient is feeling fine today with no concerning complaints except for mild fatigue.  She denied having any current chest pain, shortness of breath, cough or hemoptysis.  She denied having any nausea, vomiting no diarrhea or constipation.  She has no headache or visual changes.  She has no recent weight loss or night sweats.  She has been tolerating her treatment with consolidation Imfinzi fairly well.  The patient is here today for evaluation before starting cycle #19 with repeat CT scan of the chest for restaging of her disease.[name]  MEDICAL HISTORY: Past Medical History:  Diagnosis Date  . Blood transfusion without reported diagnosis   . Cataract   . DM (diabetes mellitus) ([facility])   . GERD (gastroesophageal reflux disease)   . Hyperlipidemia   . Hypertension   . Iron deficiency anemia   . NSCL ca dx'd 03/2018  . Thyroid disease     ALLERGIES:  is allergic to paclitaxel; aspirin; esomeprazole magnesium; and ace inhibitors.  MEDICATIONS:  Current Outpatient Medications  Medication Sig Dispense Refill  .  dexlansoprazole (DEXILANT) 60 MG capsule Take 1qam ac 90 capsule 3  . diphenhydrAMINE (BENADRYL) 2 % cream Apply 1 application topically as needed for itching.    . docusate sodium (COLACE) 100 MG capsule Take 100 mg by mouth daily as needed for mild constipation.    . ferrous sulfate 325 (65 FE) MG tablet Take 1 tablet (325 mg total) by mouth 2 (two) times daily with a meal. (Patient taking differently: Take 325 mg by mouth 2 (two) times daily with a meal. Pt reports she takes 1 tab 3 times a day) 180 tablet 1  . glipiZIDE (GLUCOTROL) 10 MG tablet Take 1 tablet (10 mg total) by mouth 2 (two) times daily before a meal. 180 tablet 1  . labetalol (NORMODYNE) 100 MG tablet Take 1 tablet (100 mg total) by mouth 2 (two) times daily. 180 tablet 0  . levothyroxine (SYNTHROID) 75 MCG tablet Take 1 tablet (75 mcg total) by mouth daily before breakfast. 30 tablet 2  . Multiple Vitamin (MULTIVITAMIN WITH MINERALS) TABS tablet Take 1 tablet by mouth at bedtime.     . prochlorperazine (COMPAZINE) 10 MG tablet Take 1 tablet (10 mg total) by mouth every 6 (six) hours as needed for nausea or vomiting. 30 tablet 0  . simvastatin (ZOCOR) 20 MG tablet Take 1 tablet (20 mg total) by mouth every evening. 90 tablet 1  . sucralfate (CARAFATE) 1 g tablet Take 1 tablet (1 g total) by mouth 4 (four) times daily. 120 tablet 2   No current facility-administered medications for this visit.    Facility-Administered Medications Ordered  in Other Visits  Medication Dose Route Frequency Provider Last Rate Last Dose  . promethazine (PHENERGAN) injection 12.5 mg  12.5 mg Intravenous Once Harle Stanford., PA-C      . Tbo-Filgrastim (GRANIX) injection 300 mcg  300 mcg Subcutaneous Once Curt Bears, MD        SURGICAL HISTORY:  Past Surgical History:  Procedure Laterality Date  . CATARACT EXTRACTION Right   . CHOLECYSTECTOMY    . COLONOSCOPY  10/24/2009   normal rectum/1X1cm abnormal lesion in the ascending colon (bx benign).  TI normal for 10cm.  Prep difficult/inadequate. f/u TCS 09/2012 recommended  . COLONOSCOPY  10/13/2004   Normal rectum/Diminutive polyps, splenic flexure, cold biopsied/removed.  Remainder of colonic mucosa appeared normal.  . COLONOSCOPY N/A 12/14/2012   ZJQ:BHALPFX polyp-tubular adenoma  . ESOPHAGOGASTRODUODENOSCOPY  10/13/2004    Normal esophagus/ Nodular volcano like lesion in the antrum, either representing a  pancreatic rest or leiomyoma, biopsied.  Remainder of the gastric mucosa appeared normal, normal D1-D2  . ESOPHAGOGASTRODUODENOSCOPY  10/24/2009   Benign biopsies. normal esophagus/small hiatal hernia/nodular lesion antrum/distal greater curvature. duodenal AVM s/p ablation  . GIVENS CAPSULE STUDY  07/27/2010    multiple arteriovenous malformations which could definitely be the contributor to her drifting hemoglobin and hematocrit  . TUBAL LIGATION    . VIDEO BRONCHOSCOPY WITH ENDOBRONCHIAL ULTRASOUND N/A 04/27/2018   Procedure: VIDEO BRONCHOSCOPY WITH ENDOBRONCHIAL ULTRASOUND;  Surgeon: Melrose Nakayama, MD;  Location: [facility];  Service: Thoracic;  Laterality: N/A;    REVIEW OF SYSTEMS:  Constitutional: positive for fatigue Eyes: negative Ears, nose, mouth, throat, and face: negative Respiratory: negative Cardiovascular: negative Gastrointestinal: negative Genitourinary:negative Integument/breast: negative Hematologic/lymphatic: negative Musculoskeletal:negative Neurological: negative Behavioral/Psych: negative Endocrine: negative Allergic/Immunologic: negative   PHYSICAL EXAMINATION: General appearance: alert, cooperative, fatigued and no distress Head: Normocephalic, without obvious abnormality, atraumatic Neck: no adenopathy, no JVD, supple, symmetrical, trachea midline and thyroid not enlarged, symmetric, no tenderness/mass/nodules Lymph nodes: Cervical, supraclavicular, and axillary nodes normal. Resp: clear to auscultation bilaterally Back: symmetric, no curvature.  ROM normal. No CVA tenderness. Cardio: regular rate and rhythm, S1, S2 normal, no murmur, click, rub or gallop GI: soft, non-tender; bowel sounds normal; no masses,  no organomegaly Extremities: extremities normal, atraumatic, no cyanosis or edema Neurologic: Alert and oriented X 3, normal strength and tone. Normal symmetric reflexes. Normal coordination and gait  ECOG PERFORMANCE STATUS: 1 - Symptomatic but completely ambulatory  Blood pressure (!) 151/65, pulse 99, temperature 98.5 F (36.9 C), temperature source Oral, resp. rate 20, height 5\' 5"  (1.651 m), weight 176 lb 3.2 oz (79.9 kg), SpO2 97 %.  LABORATORY DATA: Lab Results  Component Value Date   WBC 4.0 04/05/2019   HGB 9.6 (L) 04/05/2019   HCT 30.9 (L) 04/05/2019   MCV 89.8 04/05/2019   PLT 190 04/05/2019      Chemistry      Component Value Date/Time   NA 135 04/05/2019 1212   K 4.7 04/05/2019 1212   CL 103 04/05/2019 1212   CO2 22 04/05/2019 1212   BUN 28 (H) 04/05/2019 1212   BUN 12 12/10/2011 0919   CREATININE 1.42 (H) 04/05/2019 1212   CREATININE 0.72 12/10/2011 0919      Component Value Date/Time   CALCIUM 9.3 04/05/2019 1212   ALKPHOS 111 04/05/2019 1212   ALKPHOS 88 12/10/2011 0919   AST 21 04/05/2019 1212   ALT 19 04/05/2019 1212   BILITOT 0.2 (L) 04/05/2019 1212       RADIOGRAPHIC STUDIES:  Ct Chest W Contrast  Result Date: 04/16/2019 CLINICAL DATA:  Lung cancer. Ongoing chemotherapy. Radiation therapy complete. EXAM: CT CHEST WITH CONTRAST TECHNIQUE: Multidetector CT imaging of the chest was performed during intravenous contrast administration. CONTRAST:  65mL OMNIPAQUE IOHEXOL 300 MG/ML  SOLN COMPARISON:  01/23/2019. FINDINGS: Cardiovascular: Atherosclerotic calcification of the aorta and coronary arteries. Heart size normal. No pericardial effusion. Left ventricle may be somewhat dilated. Mediastinum/Nodes: Low-density density lymph node adjacent to the upper right lateral wall of the esophagus  measures 1.7 cm (2/36), unchanged. There is a small soft tissue density lymph node just above it, measuring 9 mm (28), stable. No additional pathologically enlarged mediastinal lymph nodes. Right hilar adenopathy measures up to approximately 1.9 cm, similar. No left hilar or axillary adenopathy. Esophagus is unremarkable. Lungs/Pleura: Spiculated right middle lobe nodule measures approximately 1.7 x 1.7 cm (5/73), stable. There is surrounding interstitial thickening, bronchiectasis and peribronchovascular ground-glass/consolidation predominantly in the right middle and right lower lobes, with some involvement of the right upper lobe. Findings are similar to 01/23/2019. Small right pleural effusion appears slightly decreased. Mild peribronchovascular ground-glass in the left lower lobe with bronchiectasis and mild architectural distortion, similar. No left pleural fluid. Debris is seen in the distal bronchus intermedius. Airway is otherwise unremarkable. Upper Abdomen: Visualized portion of the liver is unremarkable. Cholecystectomy. Right adrenal gland is unremarkable. Slight nodular thickening of the left adrenal gland, as before. Visualized portions of the kidneys, spleen, pancreas, stomach and bowel are unremarkable. No upper abdominal adenopathy. Musculoskeletal: Degenerative changes in the spine. No worrisome lytic or sclerotic lesions. Sternal nonunion fracture. IMPRESSION: 1. Right middle lobe nodule is stable, with surrounding changes of radiation therapy, similar. 2. Small right pleural effusion, slightly decreased. 3. Mediastinal and right hilar adenopathy, stable. 4. Aortic atherosclerosis (ICD10-170.0). Coronary artery calcification. Electronically Signed   By: Lorin Picket M.D.   On: 04/16/2019 11:23    ASSESSMENT AND PLAN: This is a very pleasant 73 years old African-American female recently diagnosed with a stage IIIB non-small cell lung cancer, squamous cell carcinoma.  She completed a course  of concurrent chemoradiation with weekly carboplatin and paclitaxel status post 5 cycles with partial response.  She tolerated this treatment well except for the pancytopenia and fatigue.  The patient is currently undergoing treatment with consolidation immunotherapy with Imfinzi status post 18 cycles. The patient has been tolerating her treatment well with no concerning adverse effects. She had repeat CT scan of the chest performed recently.  I personally and independently reviewed the scans and discussed the results with the patient today. Her CT scan showed no concerning findings for disease progression. I recommended for the patient to continue her current treatment with consolidation Imfinzi and she will proceed with cycle #19 today. I will see her back for follow-up visit in 2 weeks for evaluation before the next cycle of her treatment. For the anemia, she will continue with the oral iron tablets for now. She was advised to call immediately if she has any concerning symptoms in the interval. The patient voices understanding of current disease status and treatment options and is in agreement with the current care plan. All questions were answered. The patient knows to call the clinic with any problems, questions or concerns. We can certainly see the patient much sooner if necessary.  Disclaimer: This note was dictated with voice recognition software. Similar sounding words can inadvertently be transcribed and may not be corrected upon review.

## 2019-04-19 NOTE — Patient Instructions (Signed)
[facility] Discharge Instructions for Patients Receiving Chemotherapy  Today you received the following chemotherapy agents Durvalumab (IMFINZI).  To help prevent nausea and vomiting after your treatment, we encourage you to take your nausea medication as prescribed.  If you develop nausea and vomiting that is not controlled by your nausea medication, call the clinic.   BELOW ARE SYMPTOMS THAT SHOULD BE REPORTED IMMEDIATELY:  *FEVER GREATER THAN 100.5 F  *CHILLS WITH OR WITHOUT FEVER  NAUSEA AND VOMITING THAT IS NOT CONTROLLED WITH YOUR NAUSEA MEDICATION  *UNUSUAL SHORTNESS OF BREATH  *UNUSUAL BRUISING OR BLEEDING  TENDERNESS IN MOUTH AND THROAT WITH OR WITHOUT PRESENCE OF ULCERS  *URINARY PROBLEMS  *BOWEL PROBLEMS  UNUSUAL RASH Items with * indicate a potential emergency and should be followed up as soon as possible.  Feel free to call the clinic should you have any questions or concerns. The clinic phone number is [phone].  Please show the [facility] at check-in to the Emergency Department and triage nurse.  Coronavirus (COVID-19) Are you at risk?  Are you at risk for the Coronavirus (COVID-19)?  To be considered HIGH RISK for Coronavirus (COVID-19), you have to meet the following criteria:  . Traveled to [place], [place], [place], [place] or [place]; or in the [place] to [place], [place], [place], or [place]; and have fever, cough, and shortness of breath within the last 2 weeks of travel OR . Been in close contact with a person diagnosed with COVID-19 within the last 2 weeks and have fever, cough, and shortness of breath . IF YOU DO NOT MEET THESE CRITERIA, YOU ARE CONSIDERED LOW RISK FOR COVID-19.  What to do if you are HIGH RISK for COVID-19?  [name] If you are having a medical emergency, call 911. . Seek medical care right away. Before you go to a doctor's office, urgent care or emergency department, call ahead and tell them  about your recent travel, contact with someone diagnosed with COVID-19, and your symptoms. You should receive instructions from your physician's office regarding next steps of care.  . When you arrive at healthcare provider, tell the healthcare staff immediately you have returned from visiting [place], [place], [place], [place] or [place]; or traveled in the [place] to [place], [place], [place], or [place]; in the last two weeks or you have been in close contact with a person diagnosed with COVID-19 in the last 2 weeks.   . Tell the health care staff about your symptoms: fever, cough and shortness of breath. . After you have been seen by a medical provider, you will be either: o Tested for (COVID-19) and discharged home on quarantine except to seek medical care if symptoms worsen, and asked to  - Stay home and avoid contact with others until you get your results (4-5 days)  - Avoid travel on public transportation if possible (such as bus, train, or airplane) or o Sent to the Emergency Department by EMS for evaluation, COVID-19 testing, and possible admission depending on your condition and test results.  What to do if you are LOW RISK for COVID-19?  Reduce your risk of any infection by using the same precautions used for avoiding the common cold or flu:  [name] Wash your hands often with soap and warm water for at least 20 seconds.  If soap and water are not readily available, use an alcohol-based hand sanitizer with at least 60% alcohol.  . If coughing or sneezing,  cover your mouth and nose by coughing or sneezing into the elbow areas of your shirt or coat, into a tissue or into your sleeve (not your hands). . Avoid shaking hands with others and consider head nods or verbal greetings only. . Avoid touching your eyes, nose, or mouth with unwashed hands.  . Avoid close contact with people who are sick. . Avoid places or events with large numbers of people in one location, like concerts or  sporting events. . Carefully consider travel plans you have or are making. . If you are planning any travel outside or inside the [place], visit the CDC's Travelers' Health webpage for the latest health notices. . If you have some symptoms but not all symptoms, continue to monitor at home and seek medical attention if your symptoms worsen. . If you are having a medical emergency, call 911.   [facility] / e-Visit: [email or web address]         [facility]: [facility]: [phone]                   [facility]: [phone]

## 2019-05-03 ENCOUNTER — Inpatient Hospital Stay: Payer: Medicare Other

## 2019-05-03 ENCOUNTER — Inpatient Hospital Stay (HOSPITAL_BASED_OUTPATIENT_CLINIC_OR_DEPARTMENT_OTHER): Payer: Medicare Other | Admitting: Physician Assistant

## 2019-05-03 ENCOUNTER — Other Ambulatory Visit: Payer: Self-pay

## 2019-05-03 VITALS — BP 153/61 | HR 99 | Temp 98.0°F | Resp 16 | Ht 65.0 in | Wt 178.5 lb

## 2019-05-03 DIAGNOSIS — C342 Malignant neoplasm of middle lobe, bronchus or lung: Secondary | ICD-10-CM | POA: Diagnosis not present

## 2019-05-03 DIAGNOSIS — D61818 Other pancytopenia: Secondary | ICD-10-CM | POA: Diagnosis not present

## 2019-05-03 DIAGNOSIS — D500 Iron deficiency anemia secondary to blood loss (chronic): Secondary | ICD-10-CM | POA: Diagnosis not present

## 2019-05-03 DIAGNOSIS — Z7984 Long term (current) use of oral hypoglycemic drugs: Secondary | ICD-10-CM | POA: Diagnosis not present

## 2019-05-03 DIAGNOSIS — E119 Type 2 diabetes mellitus without complications: Secondary | ICD-10-CM | POA: Diagnosis not present

## 2019-05-03 DIAGNOSIS — I10 Essential (primary) hypertension: Secondary | ICD-10-CM | POA: Diagnosis not present

## 2019-05-03 DIAGNOSIS — Z5112 Encounter for antineoplastic immunotherapy: Secondary | ICD-10-CM

## 2019-05-03 DIAGNOSIS — Z79899 Other long term (current) drug therapy: Secondary | ICD-10-CM | POA: Diagnosis not present

## 2019-05-03 DIAGNOSIS — E079 Disorder of thyroid, unspecified: Secondary | ICD-10-CM | POA: Diagnosis not present

## 2019-05-03 DIAGNOSIS — R5382 Chronic fatigue, unspecified: Secondary | ICD-10-CM

## 2019-05-03 DIAGNOSIS — E785 Hyperlipidemia, unspecified: Secondary | ICD-10-CM | POA: Diagnosis not present

## 2019-05-03 LAB — CBC WITH DIFFERENTIAL (CANCER CENTER ONLY)
Abs Immature Granulocytes: 0.01 10*3/uL (ref 0.00–0.07)
Basophils Absolute: 0 10*3/uL (ref 0.0–0.1)
Basophils Relative: 1 %
Eosinophils Absolute: 0.1 10*3/uL (ref 0.0–0.5)
Eosinophils Relative: 2 %
HCT: 32.2 % — ABNORMAL LOW (ref 36.0–46.0)
Hemoglobin: 10.1 g/dL — ABNORMAL LOW (ref 12.0–15.0)
Immature Granulocytes: 0 %
Lymphocytes Relative: 10 %
Lymphs Abs: 0.4 10*3/uL — ABNORMAL LOW (ref 0.7–4.0)
MCH: 27.7 pg (ref 26.0–34.0)
MCHC: 31.4 g/dL (ref 30.0–36.0)
MCV: 88.2 fL (ref 80.0–100.0)
Monocytes Absolute: 0.3 10*3/uL (ref 0.1–1.0)
Monocytes Relative: 6 %
Neutro Abs: 3.2 10*3/uL (ref 1.7–7.7)
Neutrophils Relative %: 81 %
Platelet Count: 201 10*3/uL (ref 150–400)
RBC: 3.65 MIL/uL — ABNORMAL LOW (ref 3.87–5.11)
RDW: 13.8 % (ref 11.5–15.5)
WBC Count: 4 10*3/uL (ref 4.0–10.5)
nRBC: 0 % (ref 0.0–0.2)

## 2019-05-03 LAB — CMP (CANCER CENTER ONLY)
ALT: 17 U/L (ref 0–44)
AST: 21 U/L (ref 15–41)
Albumin: 3.8 g/dL (ref 3.5–5.0)
Alkaline Phosphatase: 98 U/L (ref 38–126)
Anion gap: 9 (ref 5–15)
BUN: 26 mg/dL — ABNORMAL HIGH (ref 8–23)
CO2: 22 mmol/L (ref 22–32)
Calcium: 9.7 mg/dL (ref 8.9–10.3)
Chloride: 106 mmol/L (ref 98–111)
Creatinine: 1.33 mg/dL — ABNORMAL HIGH (ref 0.44–1.00)
GFR, Est AFR Am: 46 mL/min — ABNORMAL LOW (ref >60)
GFR, Estimated: 40 mL/min — ABNORMAL LOW (ref >60)
Glucose, Bld: 291 mg/dL — ABNORMAL HIGH (ref 70–99)
Potassium: 4.2 mmol/L (ref 3.5–5.1)
Sodium: 137 mmol/L (ref 135–145)
Total Bilirubin: <0.2 mg/dL — ABNORMAL LOW (ref 0.3–1.2)
Total Protein: 8.1 g/dL (ref 6.5–8.1)

## 2019-05-03 LAB — TSH: TSH: 2.315 u[IU]/mL (ref 0.308–3.960)

## 2019-05-03 MED ORDER — SODIUM CHLORIDE 0.9 % IV SOLN
Freq: Once | INTRAVENOUS | Status: AC
  Administered 2019-05-03: 16:00:00 via INTRAVENOUS
  Filled 2019-05-03: qty 250

## 2019-05-03 MED ORDER — SODIUM CHLORIDE 0.9 % IV SOLN
740.0000 mg | Freq: Once | INTRAVENOUS | Status: AC
  Administered 2019-05-03: 740 mg via INTRAVENOUS
  Filled 2019-05-03: qty 4.8

## 2019-05-03 NOTE — Progress Notes (Signed)
[facility] OFFICE PROGRESS NOTE  [name], MD [place] [address]  DIAGNOSIS: Stage IIIB (T3,N3, M0)non-small cell lung cancer, squamous cell carcinoma presented with large right middle lobe lungmassin addition to mediastinal and bilateral hilar lymphadenopathy and suspicious right supraclavicular lymph node diagnosed in August 2019.  PRIOR THERAPY: Acourse of concurrent chemoradiation with weekly carboplatin for AUC of 2 and paclitaxel 45 MG/M2.First dose of chemotherapy given on 05/29/2018. Status post 5 cycles.  CURRENT THERAPY: Consolidation treatment with immunotherapy with Imfinzi 10 mg/KG every 2 weeks. First dose August 10, 2018. Status post 20cycles.  INTERVAL HISTORY: Jenna [name] 73 y.o. female returns to the clinic for a follow up visit. The patient is feeling well today without any concerning complaints. The patient continues to tolerate treatment with immunotherapy well without any adverse effects. Denies any fever, chills, night sweats, or weight loss. Denies any chest pain, shortness of breath, cough, or hemoptysis. Denies any nausea, vomiting, diarrhea, or constipation. Denies any headache or visual changes. Denies any rashes or skin changes. The patient is here today for evaluation prior to starting cycle #20  MEDICAL HISTORY: Past Medical History:  Diagnosis Date  . Blood transfusion without reported diagnosis   . Cataract   . DM (diabetes mellitus) ([place])   . GERD (gastroesophageal reflux disease)   . Hyperlipidemia   . Hypertension   . Iron deficiency anemia   . NSCL ca dx'd 03/2018  . Thyroid disease     ALLERGIES:  is allergic to paclitaxel; aspirin; esomeprazole magnesium; and ace inhibitors.  MEDICATIONS:  Current Outpatient Medications  Medication Sig Dispense Refill  . dexlansoprazole (DEXILANT) 60 MG capsule Take 1qam ac 90 capsule 3  . diphenhydrAMINE (BENADRYL) 2 % cream Apply 1 application  topically as needed for itching.    . docusate sodium (COLACE) 100 MG capsule Take 100 mg by mouth daily as needed for mild constipation.    . ferrous sulfate 325 (65 FE) MG tablet Take 1 tablet (325 mg total) by mouth 2 (two) times daily with a meal. (Patient taking differently: Take 325 mg by mouth 2 (two) times daily with a meal. Pt reports she takes 1 tab 3 times a day) 180 tablet 1  . glipiZIDE (GLUCOTROL) 10 MG tablet Take 1 tablet (10 mg total) by mouth 2 (two) times daily before a meal. 180 tablet 1  . labetalol (NORMODYNE) 100 MG tablet Take 1 tablet (100 mg total) by mouth 2 (two) times daily. 180 tablet 0  . levothyroxine (SYNTHROID) 75 MCG tablet Take 1 tablet (75 mcg total) by mouth daily before breakfast. 30 tablet 2  . Multiple Vitamin (MULTIVITAMIN WITH MINERALS) TABS tablet Take 1 tablet by mouth at bedtime.     . prochlorperazine (COMPAZINE) 10 MG tablet Take 1 tablet (10 mg total) by mouth every 6 (six) hours as needed for nausea or vomiting. 30 tablet 0  . simvastatin (ZOCOR) 20 MG tablet Take 1 tablet (20 mg total) by mouth every evening. 90 tablet 1  . sucralfate (CARAFATE) 1 g tablet Take 1 tablet (1 g total) by mouth 4 (four) times daily. 120 tablet 2   No current facility-administered medications for this visit.    Facility-Administered Medications Ordered in Other Visits  Medication Dose Route Frequency Provider Last Rate Last Dose  . durvalumab (IMFINZI) 740 mg in sodium chloride 0.9 % 100 mL chemo infusion  740 mg Intravenous Once Curt Bears, MD 115 mL/hr at 05/03/19 1604 740  mg at 05/03/19 1604  . promethazine (PHENERGAN) injection 12.5 mg  12.5 mg Intravenous Once Harle Stanford., PA-C      . Tbo-Filgrastim (GRANIX) injection 300 mcg  300 mcg Subcutaneous Once Curt Bears, MD        SURGICAL HISTORY:  Past Surgical History:  Procedure Laterality Date  . CATARACT EXTRACTION Right   . CHOLECYSTECTOMY    . COLONOSCOPY  10/24/2009   normal rectum/1X1cm  abnormal lesion in the ascending colon (bx benign). TI normal for 10cm.  Prep difficult/inadequate. f/u TCS 09/2012 recommended  . COLONOSCOPY  10/13/2004   Normal rectum/Diminutive polyps, splenic flexure, cold biopsied/removed.  Remainder of colonic mucosa appeared normal.  . COLONOSCOPY N/A 12/14/2012   [id number] polyp-tubular adenoma  . ESOPHAGOGASTRODUODENOSCOPY  10/13/2004    Normal esophagus/ Nodular volcano like lesion in the antrum, either representing a  pancreatic rest or leiomyoma, biopsied.  Remainder of the gastric mucosa appeared normal, normal D1-D2  . ESOPHAGOGASTRODUODENOSCOPY  10/24/2009   Benign biopsies. normal esophagus/small hiatal hernia/nodular lesion antrum/distal greater curvature. duodenal AVM s/p ablation  . GIVENS CAPSULE STUDY  07/27/2010    multiple arteriovenous malformations which could definitely be the contributor to her drifting hemoglobin and hematocrit  . TUBAL LIGATION    . VIDEO BRONCHOSCOPY WITH ENDOBRONCHIAL ULTRASOUND N/A 04/27/2018   Procedure: VIDEO BRONCHOSCOPY WITH ENDOBRONCHIAL ULTRASOUND;  Surgeon: Melrose Nakayama, MD;  Location: [facility] OR;  Service: Thoracic;  Laterality: N/A;    REVIEW OF SYSTEMS:   Review of Systems  Constitutional: Negative for appetite change, chills, fatigue, fever and unexpected weight change.  HENT: Negative for mouth sores, nosebleeds, sore throat and trouble swallowing.   Eyes: Negative for eye problems and icterus.  Respiratory: Negative for cough, hemoptysis, shortness of breath and wheezing.   Cardiovascular: Negative for chest pain and leg swelling.  Gastrointestinal: Negative for abdominal pain, constipation, diarrhea, nausea and vomiting.  Genitourinary: Negative for bladder incontinence, difficulty urinating, dysuria, frequency and hematuria.   Musculoskeletal: Negative for back pain, gait problem, neck pain and neck stiffness.  Skin: Negative for itching and rash.  Neurological: Negative for dizziness,  extremity weakness, gait problem, headaches, light-headedness and seizures.  Hematological: Negative for adenopathy. Does not bruise/bleed easily.  Psychiatric/Behavioral: Negative for confusion, depression and sleep disturbance. The patient is not nervous/anxious.   PHYSICAL EXAMINATION:  Blood pressure (!) 153/61, pulse 99, temperature 98 F (36.7 C), temperature source Temporal, resp. rate 16, height 5\' 5"  (1.651 m), weight 178 lb 8 oz (81 kg), SpO2 97 %.  ECOG PERFORMANCE STATUS: 1 - Symptomatic but completely ambulatory  Physical Exam  Constitutional: Oriented to person, place, and time and well-developed, well-nourished, and in no distress.  HENT:  Head: Normocephalic and atraumatic.  Mouth/Throat: Oropharynx is clear and moist. No oropharyngeal exudate.  Eyes: Conjunctivae are normal. Right eye exhibits no discharge. Left eye exhibits no discharge. No scleral icterus.  Neck: Normal range of motion. Neck supple.  Cardiovascular: Normal rate, regular rhythm, normal heart sounds and intact distal pulses.   Pulmonary/Chest: Effort normal and breath sounds normal. No respiratory distress. No wheezes. No rales.  Abdominal: Soft. Bowel sounds are normal. Exhibits no distension and no mass. There is no tenderness.  Musculoskeletal: Normal range of motion. Exhibits no edema.  Lymphadenopathy:    No cervical adenopathy.  Neurological: Alert and oriented to person, place, and time. Exhibits normal muscle tone. Gait normal. Coordination normal.  Skin: Skin is warm and dry. No rash noted. Not diaphoretic. No erythema. No pallor.  Psychiatric: Mood, memory and judgment normal.  Vitals reviewed.  LABORATORY DATA: Lab Results  Component Value Date   WBC 4.0 05/03/2019   HGB 10.1 (L) 05/03/2019   HCT 32.2 (L) 05/03/2019   MCV 88.2 05/03/2019   PLT 201 05/03/2019      Chemistry      Component Value Date/Time   NA 137 05/03/2019 1340   K 4.2 05/03/2019 1340   CL 106 05/03/2019 1340    CO2 22 05/03/2019 1340   BUN 26 (H) 05/03/2019 1340   BUN 12 12/10/2011 0919   CREATININE 1.33 (H) 05/03/2019 1340   CREATININE 0.72 12/10/2011 0919      Component Value Date/Time   CALCIUM 9.7 05/03/2019 1340   ALKPHOS 98 05/03/2019 1340   ALKPHOS 88 12/10/2011 0919   AST 21 05/03/2019 1340   ALT 17 05/03/2019 1340   BILITOT <0.2 (L) 05/03/2019 1340       RADIOGRAPHIC STUDIES:  Ct Chest W Contrast  Result Date: 04/16/2019 CLINICAL DATA:  Lung cancer. Ongoing chemotherapy. Radiation therapy complete. EXAM: CT CHEST WITH CONTRAST TECHNIQUE: Multidetector CT imaging of the chest was performed during intravenous contrast administration. CONTRAST:  16mL OMNIPAQUE IOHEXOL 300 MG/ML  SOLN COMPARISON:  01/23/2019. FINDINGS: Cardiovascular: Atherosclerotic calcification of the aorta and coronary arteries. Heart size normal. No pericardial effusion. Left ventricle may be somewhat dilated. Mediastinum/Nodes: Low-density density lymph node adjacent to the upper right lateral wall of the esophagus measures 1.7 cm (2/36), unchanged. There is a small soft tissue density lymph node just above it, measuring 9 mm (28), stable. No additional pathologically enlarged mediastinal lymph nodes. Right hilar adenopathy measures up to approximately 1.9 cm, similar. No left hilar or axillary adenopathy. Esophagus is unremarkable. Lungs/Pleura: Spiculated right middle lobe nodule measures approximately 1.7 x 1.7 cm (5/73), stable. There is surrounding interstitial thickening, bronchiectasis and peribronchovascular ground-glass/consolidation predominantly in the right middle and right lower lobes, with some involvement of the right upper lobe. Findings are similar to 01/23/2019. Small right pleural effusion appears slightly decreased. Mild peribronchovascular ground-glass in the left lower lobe with bronchiectasis and mild architectural distortion, similar. No left pleural fluid. Debris is seen in the distal bronchus  intermedius. Airway is otherwise unremarkable. Upper Abdomen: Visualized portion of the liver is unremarkable. Cholecystectomy. Right adrenal gland is unremarkable. Slight nodular thickening of the left adrenal gland, as before. Visualized portions of the kidneys, spleen, pancreas, stomach and bowel are unremarkable. No upper abdominal adenopathy. Musculoskeletal: Degenerative changes in the spine. No worrisome lytic or sclerotic lesions. Sternal nonunion fracture. IMPRESSION: 1. Right middle lobe nodule is stable, with surrounding changes of radiation therapy, similar. 2. Small right pleural effusion, slightly decreased. 3. Mediastinal and right hilar adenopathy, stable. 4. Aortic atherosclerosis (ICD10-170.0). Coronary artery calcification. Electronically Signed   By: Lorin Picket M.D.   On: 04/16/2019 11:23     ASSESSMENT/PLAN:  This is a very pleasant 73 year old African-American female diagnosed with stageIIIbnon-small cell lung cancer, squamous cell carcinoma. She presented with a large right middle lobe lung mass in addition to mediastinal and bilateral hilar lymphadenopathy. She also hassuspicious right supraclavicular lymphadenopathy. She was diagnosed in August 2019.  She previously underwent concurrent chemoradiation with carboplatin and paclitaxel. She is status post 5 cycles with a partial response. She tolerated treatment fairlywell except for pancytopenia and fatigue.  The patient is currently undergoing consolidation immunotherapy with Imfinzi 10 mg/kg IV every 2 weeks. She is status post 19 cycles. She has been tolerating it well without any adverse  effects. I recommend that she proceed with cycle #20 today as scheduled.  We will see the patient back for a follow up visit in 2 weeks for evaluation and to review her scan results.   The patient was advised to call immediately if she has any concerning symptoms in the interval. The patient voices understanding of  current disease status and treatment options and is in agreement with the current care plan. All questions were answered. The patient knows to call the clinic with any problems, questions or concerns. We can certainly see the patient much sooner if necessary   No orders of the defined types were placed in this encounter.    Reda Gettis L Grier Vu, PA-C 05/03/19

## 2019-05-03 NOTE — Patient Instructions (Signed)
[facility] Discharge Instructions for Patients Receiving Chemotherapy  Today you received the following chemotherapy agents :  Durvalumab.  To help prevent nausea and vomiting after your treatment, we encourage you to take your nausea medication as prescribed.   If you develop nausea and vomiting that is not controlled by your nausea medication, call the clinic.   BELOW ARE SYMPTOMS THAT SHOULD BE REPORTED IMMEDIATELY:  *FEVER GREATER THAN 100.5 F  *CHILLS WITH OR WITHOUT FEVER  NAUSEA AND VOMITING THAT IS NOT CONTROLLED WITH YOUR NAUSEA MEDICATION  *UNUSUAL SHORTNESS OF BREATH  *UNUSUAL BRUISING OR BLEEDING  TENDERNESS IN MOUTH AND THROAT WITH OR WITHOUT PRESENCE OF ULCERS  *URINARY PROBLEMS  *BOWEL PROBLEMS  UNUSUAL RASH Items with * indicate a potential emergency and should be followed up as soon as possible.  Feel free to call the clinic should you have any questions or concerns. The clinic phone number is [phone].  Please show the [facility] at check-in to the Emergency Department and triage nurse.

## 2019-05-16 ENCOUNTER — Other Ambulatory Visit: Payer: Self-pay | Admitting: Physician Assistant

## 2019-05-16 ENCOUNTER — Other Ambulatory Visit: Payer: Self-pay | Admitting: Family Medicine

## 2019-05-16 DIAGNOSIS — E039 Hypothyroidism, unspecified: Secondary | ICD-10-CM

## 2019-05-17 ENCOUNTER — Other Ambulatory Visit: Payer: Self-pay

## 2019-05-17 ENCOUNTER — Encounter: Payer: Self-pay | Admitting: Internal Medicine

## 2019-05-17 ENCOUNTER — Inpatient Hospital Stay (HOSPITAL_BASED_OUTPATIENT_CLINIC_OR_DEPARTMENT_OTHER): Payer: Medicare Other | Admitting: Internal Medicine

## 2019-05-17 ENCOUNTER — Inpatient Hospital Stay: Payer: Medicare Other | Attending: Physician Assistant

## 2019-05-17 ENCOUNTER — Inpatient Hospital Stay: Payer: Medicare Other

## 2019-05-17 VITALS — BP 160/73 | HR 99 | Temp 97.8°F | Resp 18 | Ht 65.0 in | Wt 176.2 lb

## 2019-05-17 DIAGNOSIS — C342 Malignant neoplasm of middle lobe, bronchus or lung: Secondary | ICD-10-CM

## 2019-05-17 DIAGNOSIS — Z79899 Other long term (current) drug therapy: Secondary | ICD-10-CM | POA: Diagnosis not present

## 2019-05-17 DIAGNOSIS — E1165 Type 2 diabetes mellitus with hyperglycemia: Secondary | ICD-10-CM | POA: Diagnosis not present

## 2019-05-17 DIAGNOSIS — Z23 Encounter for immunization: Secondary | ICD-10-CM | POA: Insufficient documentation

## 2019-05-17 DIAGNOSIS — E079 Disorder of thyroid, unspecified: Secondary | ICD-10-CM | POA: Insufficient documentation

## 2019-05-17 DIAGNOSIS — I10 Essential (primary) hypertension: Secondary | ICD-10-CM | POA: Insufficient documentation

## 2019-05-17 DIAGNOSIS — E119 Type 2 diabetes mellitus without complications: Secondary | ICD-10-CM

## 2019-05-17 DIAGNOSIS — Z5112 Encounter for antineoplastic immunotherapy: Secondary | ICD-10-CM | POA: Insufficient documentation

## 2019-05-17 DIAGNOSIS — Z794 Long term (current) use of insulin: Secondary | ICD-10-CM | POA: Diagnosis not present

## 2019-05-17 DIAGNOSIS — E785 Hyperlipidemia, unspecified: Secondary | ICD-10-CM | POA: Insufficient documentation

## 2019-05-17 LAB — CBC WITH DIFFERENTIAL (CANCER CENTER ONLY)
Abs Immature Granulocytes: 0.01 10*3/uL (ref 0.00–0.07)
Basophils Absolute: 0 10*3/uL (ref 0.0–0.1)
Basophils Relative: 0 %
Eosinophils Absolute: 0 10*3/uL (ref 0.0–0.5)
Eosinophils Relative: 1 %
HCT: 31.8 % — ABNORMAL LOW (ref 36.0–46.0)
Hemoglobin: 9.9 g/dL — ABNORMAL LOW (ref 12.0–15.0)
Immature Granulocytes: 0 %
Lymphocytes Relative: 7 %
Lymphs Abs: 0.3 10*3/uL — ABNORMAL LOW (ref 0.7–4.0)
MCH: 27.9 pg (ref 26.0–34.0)
MCHC: 31.1 g/dL (ref 30.0–36.0)
MCV: 89.6 fL (ref 80.0–100.0)
Monocytes Absolute: 0.3 10*3/uL (ref 0.1–1.0)
Monocytes Relative: 6 %
Neutro Abs: 3.5 10*3/uL (ref 1.7–7.7)
Neutrophils Relative %: 86 %
Platelet Count: 187 10*3/uL (ref 150–400)
RBC: 3.55 MIL/uL — ABNORMAL LOW (ref 3.87–5.11)
RDW: 14.2 % (ref 11.5–15.5)
WBC Count: 4.1 10*3/uL (ref 4.0–10.5)
nRBC: 0 % (ref 0.0–0.2)

## 2019-05-17 LAB — CMP (CANCER CENTER ONLY)
ALT: 12 U/L (ref 0–44)
AST: 19 U/L (ref 15–41)
Albumin: 3.7 g/dL (ref 3.5–5.0)
Alkaline Phosphatase: 92 U/L (ref 38–126)
Anion gap: 8 (ref 5–15)
BUN: 19 mg/dL (ref 8–23)
CO2: 24 mmol/L (ref 22–32)
Calcium: 9.4 mg/dL (ref 8.9–10.3)
Chloride: 104 mmol/L (ref 98–111)
Creatinine: 1.28 mg/dL — ABNORMAL HIGH (ref 0.44–1.00)
GFR, Est AFR Am: 48 mL/min — ABNORMAL LOW (ref >60)
GFR, Estimated: 42 mL/min — ABNORMAL LOW (ref >60)
Glucose, Bld: 339 mg/dL — ABNORMAL HIGH (ref 70–99)
Potassium: 4.6 mmol/L (ref 3.5–5.1)
Sodium: 136 mmol/L (ref 135–145)
Total Bilirubin: 0.3 mg/dL (ref 0.3–1.2)
Total Protein: 8 g/dL (ref 6.5–8.1)

## 2019-05-17 MED ORDER — INSULIN REGULAR HUMAN 100 UNIT/ML IJ SOLN
10.0000 [IU] | Freq: Once | INTRAMUSCULAR | Status: AC
  Administered 2019-05-17: 13:00:00 10 [IU] via SUBCUTANEOUS
  Filled 2019-05-17: qty 10

## 2019-05-17 MED ORDER — SODIUM CHLORIDE 0.9 % IV SOLN
Freq: Once | INTRAVENOUS | Status: AC
  Administered 2019-05-17: 13:00:00 via INTRAVENOUS
  Filled 2019-05-17: qty 250

## 2019-05-17 MED ORDER — SODIUM CHLORIDE 0.9 % IV SOLN
740.0000 mg | Freq: Once | INTRAVENOUS | Status: AC
  Administered 2019-05-17: 14:00:00 740 mg via INTRAVENOUS
  Filled 2019-05-17: qty 10

## 2019-05-17 NOTE — Progress Notes (Signed)
[facility] Telephone:([phone]   [phone]  OFFICE PROGRESS NOTE  [name], MD [place] [address]  DIAGNOSIS: Stage IIIB (T3,N3, M0)non-small cell lung cancer, squamous cell carcinoma presented with large right middle lobe lung breast in addition to mediastinal and bilateral hilar lymphadenopathy and suspicious right supraclavicular lymph node diagnosed in August 2019.   PRIOR THERAPY: A course of concurrent chemoradiation with weekly carboplatin for AUC of 2 and paclitaxel 45 MG/M2.  First dose of chemotherapy given on 05/29/2018.  Status post 5 cycles.  CURRENT THERAPY:  Consolidation treatment with immunotherapy with Imfinzi 10 mg/KG every 2 weeks.  First dose August 10, 2018.  Status post 20 cycles.  INTERVAL HISTORY: Jenna [name] 73 y.o. female returns to the clinic today for follow-up visit.  The patient is feeling fine today with no concerning complaints except for mild fatigue.  She denied having any chest pain, shortness of breath, cough or hemoptysis.  She denied having any recent weight loss or night sweats.  She has no nausea, vomiting, diarrhea or constipation.  She has no headache or visual changes.  She continues to tolerate her treatment with Imfinzi fairly well.  She is here today for evaluation before starting cycle #21. [name]  MEDICAL HISTORY: Past Medical History:  Diagnosis Date  . Blood transfusion without reported diagnosis   . Cataract   . DM (diabetes mellitus) ([place])   . GERD (gastroesophageal reflux disease)   . Hyperlipidemia   . Hypertension   . Iron deficiency anemia   . NSCL ca dx'd 03/2018  . Thyroid disease     ALLERGIES:  is allergic to paclitaxel; aspirin; esomeprazole magnesium; and ace inhibitors.  MEDICATIONS:  Current Outpatient Medications  Medication Sig Dispense Refill  . dexlansoprazole (DEXILANT) 60 MG capsule Take 1qam ac 90 capsule 3  . diphenhydrAMINE  (BENADRYL) 2 % cream Apply 1 application topically as needed for itching.    . docusate sodium (COLACE) 100 MG capsule Take 100 mg by mouth daily as needed for mild constipation.    . EUTHYROX 75 MCG tablet TAKE 1 TABLET BY MOUTH ONCE DAILY BEFORE BREAKFAST 30 tablet 0  . Ferrous Sulfate (IRON) 325 (65 Fe) MG TABS TAKE 1 TABLET BY MOUTH TWICE DAILY WITH A MEAL 180 tablet 0  . glipiZIDE (GLUCOTROL) 10 MG tablet TAKE 1 TABLET BY MOUTH TWICE DAILY BEFORE A MEAL 180 tablet 0  . labetalol (NORMODYNE) 100 MG tablet Take 1 tablet (100 mg total) by mouth 2 (two) times daily. 180 tablet 0  . Multiple Vitamin (MULTIVITAMIN WITH MINERALS) TABS tablet Take 1 tablet by mouth at bedtime.     . prochlorperazine (COMPAZINE) 10 MG tablet Take 1 tablet (10 mg total) by mouth every 6 (six) hours as needed for nausea or vomiting. 30 tablet 0  . simvastatin (ZOCOR) 20 MG tablet Take 1 tablet (20 mg total) by mouth every evening. 90 tablet 1  . sucralfate (CARAFATE) 1 g tablet Take 1 tablet (1 g total) by mouth 4 (four) times daily. 120 tablet 2   No current facility-administered medications for this visit.    Facility-Administered Medications Ordered in Other Visits  Medication Dose Route Frequency Provider Last Rate Last Dose  . promethazine (PHENERGAN) injection 12.5 mg  12.5 mg Intravenous Once Harle Stanford., PA-C      . Tbo-Filgrastim (GRANIX) injection 300 mcg  300 mcg Subcutaneous Once Curt Bears, MD  SURGICAL HISTORY:  Past Surgical History:  Procedure Laterality Date  . CATARACT EXTRACTION Right   . CHOLECYSTECTOMY    . COLONOSCOPY  10/24/2009   normal rectum/1X1cm abnormal lesion in the ascending colon (bx benign). TI normal for 10cm.  Prep difficult/inadequate. f/u TCS 09/2012 recommended  . COLONOSCOPY  10/13/2004   Normal rectum/Diminutive polyps, splenic flexure, cold biopsied/removed.  Remainder of colonic mucosa appeared normal.  . COLONOSCOPY N/A 12/14/2012   [id number]  polyp-tubular adenoma  . ESOPHAGOGASTRODUODENOSCOPY  10/13/2004    Normal esophagus/ Nodular volcano like lesion in the antrum, either representing a  pancreatic rest or leiomyoma, biopsied.  Remainder of the gastric mucosa appeared normal, normal D1-D2  . ESOPHAGOGASTRODUODENOSCOPY  10/24/2009   Benign biopsies. normal esophagus/small hiatal hernia/nodular lesion antrum/distal greater curvature. duodenal AVM s/p ablation  . GIVENS CAPSULE STUDY  07/27/2010    multiple arteriovenous malformations which could definitely be the contributor to her drifting hemoglobin and hematocrit  . TUBAL LIGATION    . VIDEO BRONCHOSCOPY WITH ENDOBRONCHIAL ULTRASOUND N/A 04/27/2018   Procedure: VIDEO BRONCHOSCOPY WITH ENDOBRONCHIAL ULTRASOUND;  Surgeon: Melrose Nakayama, MD;  Location: [facility] OR;  Service: Thoracic;  Laterality: N/A;    REVIEW OF SYSTEMS:  A comprehensive review of systems was negative except for: Constitutional: positive for fatigue   PHYSICAL EXAMINATION: General appearance: alert, cooperative, fatigued and no distress Head: Normocephalic, without obvious abnormality, atraumatic Neck: no adenopathy, no JVD, supple, symmetrical, trachea midline and thyroid not enlarged, symmetric, no tenderness/mass/nodules Lymph nodes: Cervical, supraclavicular, and axillary nodes normal. Resp: clear to auscultation bilaterally Back: symmetric, no curvature. ROM normal. No CVA tenderness. Cardio: regular rate and rhythm, S1, S2 normal, no murmur, click, rub or gallop GI: soft, non-tender; bowel sounds normal; no masses,  no organomegaly Extremities: extremities normal, atraumatic, no cyanosis or edema  ECOG PERFORMANCE STATUS: 1 - Symptomatic but completely ambulatory  Blood pressure (!) 160/73, pulse 99, temperature 97.8 F (36.6 C), temperature source Temporal, resp. rate 18, height 5\' 5"  (1.651 m), weight 176 lb 3.2 oz (79.9 kg), SpO2 98 %.  LABORATORY DATA: Lab Results  Component Value Date   WBC  4.1 05/17/2019   HGB 9.9 (L) 05/17/2019   HCT 31.8 (L) 05/17/2019   MCV 89.6 05/17/2019   PLT 187 05/17/2019      Chemistry      Component Value Date/Time   NA 136 05/17/2019 1150   K 4.6 05/17/2019 1150   CL 104 05/17/2019 1150   CO2 24 05/17/2019 1150   BUN 19 05/17/2019 1150   BUN 12 12/10/2011 0919   CREATININE 1.28 (H) 05/17/2019 1150   CREATININE 0.72 12/10/2011 0919      Component Value Date/Time   CALCIUM 9.4 05/17/2019 1150   ALKPHOS 92 05/17/2019 1150   ALKPHOS 88 12/10/2011 0919   AST 19 05/17/2019 1150   ALT 12 05/17/2019 1150   BILITOT 0.3 05/17/2019 1150       RADIOGRAPHIC STUDIES: No results found.  ASSESSMENT AND PLAN: This is a very pleasant 73 years old African-American female recently diagnosed with a stage IIIB non-small cell lung cancer, squamous cell carcinoma.  She completed a course of concurrent chemoradiation with weekly carboplatin and paclitaxel status post 5 cycles with partial response.  She tolerated this treatment well except for the pancytopenia and fatigue.  The patient is currently undergoing treatment with consolidation immunotherapy with Imfinzi status post 20 cycles. The patient has been tolerating this treatment well with no concerning adverse effects. I recommended for  her to proceed with cycle #21 today as planned. I will see her back for follow-up visit in 2 weeks for evaluation before the next cycle of her treatment. For the anemia, she will continue with the oral iron tablets for now. For the hyperglycemia, we will give the patient 10 units of regular insulin subcutaneously today.  She was advised to monitor her blood sugar closely at home. The patient was advised to call immediately if she has any concerning symptoms in the interval. The patient voices understanding of current disease status and treatment options and is in agreement with the current care plan. All questions were answered. The patient knows to call the clinic with  any problems, questions or concerns. We can certainly see the patient much sooner if necessary.  Disclaimer: This note was dictated with voice recognition software. Similar sounding words can inadvertently be transcribed and may not be corrected upon review.

## 2019-05-17 NOTE — Progress Notes (Signed)
Verbal order from Dr. Julien Nordmann: administer 10 units of Regular insulin for patient's blood glucose of 339.

## 2019-05-17 NOTE — Patient Instructions (Signed)
[facility] Discharge Instructions for Patients Receiving Chemotherapy  Today you received the following chemotherapy agents Durvalumab (IMFINZI).  To help prevent nausea and vomiting after your treatment, we encourage you to take your nausea medication as prescribed.   If you develop nausea and vomiting that is not controlled by your nausea medication, call the clinic.   BELOW ARE SYMPTOMS THAT SHOULD BE REPORTED IMMEDIATELY:  *FEVER GREATER THAN 100.5 F  *CHILLS WITH OR WITHOUT FEVER  NAUSEA AND VOMITING THAT IS NOT CONTROLLED WITH YOUR NAUSEA MEDICATION  *UNUSUAL SHORTNESS OF BREATH  *UNUSUAL BRUISING OR BLEEDING  TENDERNESS IN MOUTH AND THROAT WITH OR WITHOUT PRESENCE OF ULCERS  *URINARY PROBLEMS  *BOWEL PROBLEMS  UNUSUAL RASH Items with * indicate a potential emergency and should be followed up as soon as possible.  Feel free to call the clinic should you have any questions or concerns. The clinic phone number is [phone].  Please show the [facility] at check-in to the Emergency Department and triage nurse.  Coronavirus (COVID-19) Are you at risk?  Are you at risk for the Coronavirus (COVID-19)?  To be considered HIGH RISK for Coronavirus (COVID-19), you have to meet the following criteria:  . Traveled to [place], [place], [place], [place] or [place]; or in the [place] to [place], [place], [place], or [place]; and have fever, cough, and shortness of breath within the last 2 weeks of travel OR . Been in close contact with a person diagnosed with COVID-19 within the last 2 weeks and have fever, cough, and shortness of breath . IF YOU DO NOT MEET THESE CRITERIA, YOU ARE CONSIDERED LOW RISK FOR COVID-19.  What to do if you are HIGH RISK for COVID-19?  [name] If you are having a medical emergency, call 911. . Seek medical care right away. Before you go to a doctor's office, urgent care or emergency department, call ahead and tell them  about your recent travel, contact with someone diagnosed with COVID-19, and your symptoms. You should receive instructions from your physician's office regarding next steps of care.  . When you arrive at healthcare provider, tell the healthcare staff immediately you have returned from visiting [place], [place], [place], [place] or [place]; or traveled in the [place] to [place], [place], [place], or [place]; in the last two weeks or you have been in close contact with a person diagnosed with COVID-19 in the last 2 weeks.   . Tell the health care staff about your symptoms: fever, cough and shortness of breath. . After you have been seen by a medical provider, you will be either: o Tested for (COVID-19) and discharged home on quarantine except to seek medical care if symptoms worsen, and asked to  - Stay home and avoid contact with others until you get your results (4-5 days)  - Avoid travel on public transportation if possible (such as bus, train, or airplane) or o Sent to the Emergency Department by EMS for evaluation, COVID-19 testing, and possible admission depending on your condition and test results.  What to do if you are LOW RISK for COVID-19?  Reduce your risk of any infection by using the same precautions used for avoiding the common cold or flu:  [name] Wash your hands often with soap and warm water for at least 20 seconds.  If soap and water are not readily available, use an alcohol-based hand sanitizer with at least 60% alcohol.  . If coughing or  sneezing, cover your mouth and nose by coughing or sneezing into the elbow areas of your shirt or coat, into a tissue or into your sleeve (not your hands). . Avoid shaking hands with others and consider head nods or verbal greetings only. . Avoid touching your eyes, nose, or mouth with unwashed hands.  . Avoid close contact with people who are sick. . Avoid places or events with large numbers of people in one location, like concerts or  sporting events. . Carefully consider travel plans you have or are making. . If you are planning any travel outside or inside the [place], visit the CDC's Travelers' Health webpage for the latest health notices. . If you have some symptoms but not all symptoms, continue to monitor at home and seek medical attention if your symptoms worsen. . If you are having a medical emergency, call 911.   [facility] / e-Visit: [email or web address]         [facility]: [facility]: [phone]                   [facility]: [phone]

## 2019-05-18 ENCOUNTER — Telehealth: Payer: Self-pay | Admitting: Internal Medicine

## 2019-05-18 NOTE — Telephone Encounter (Signed)
Scheduled appt per 10/1 los - pt to get an updated schedule next visit.

## 2019-05-31 ENCOUNTER — Inpatient Hospital Stay (HOSPITAL_BASED_OUTPATIENT_CLINIC_OR_DEPARTMENT_OTHER): Payer: Medicare Other | Admitting: Physician Assistant

## 2019-05-31 ENCOUNTER — Telehealth: Payer: Self-pay

## 2019-05-31 ENCOUNTER — Inpatient Hospital Stay: Payer: Medicare Other

## 2019-05-31 ENCOUNTER — Other Ambulatory Visit: Payer: Self-pay | Admitting: Physician Assistant

## 2019-05-31 ENCOUNTER — Other Ambulatory Visit: Payer: Self-pay

## 2019-05-31 VITALS — BP 150/79 | HR 104 | Temp 98.0°F | Resp 17 | Ht 65.0 in | Wt 175.7 lb

## 2019-05-31 DIAGNOSIS — E119 Type 2 diabetes mellitus without complications: Secondary | ICD-10-CM | POA: Diagnosis not present

## 2019-05-31 DIAGNOSIS — C342 Malignant neoplasm of middle lobe, bronchus or lung: Secondary | ICD-10-CM

## 2019-05-31 DIAGNOSIS — E039 Hypothyroidism, unspecified: Secondary | ICD-10-CM

## 2019-05-31 DIAGNOSIS — D500 Iron deficiency anemia secondary to blood loss (chronic): Secondary | ICD-10-CM | POA: Diagnosis not present

## 2019-05-31 DIAGNOSIS — Z5112 Encounter for antineoplastic immunotherapy: Secondary | ICD-10-CM | POA: Diagnosis not present

## 2019-05-31 DIAGNOSIS — Z23 Encounter for immunization: Secondary | ICD-10-CM

## 2019-05-31 DIAGNOSIS — R5382 Chronic fatigue, unspecified: Secondary | ICD-10-CM

## 2019-05-31 LAB — CMP (CANCER CENTER ONLY)
ALT: 15 U/L (ref 0–44)
AST: 20 U/L (ref 15–41)
Albumin: 3.5 g/dL (ref 3.5–5.0)
Alkaline Phosphatase: 102 U/L (ref 38–126)
Anion gap: 10 (ref 5–15)
BUN: 22 mg/dL (ref 8–23)
CO2: 22 mmol/L (ref 22–32)
Calcium: 9.4 mg/dL (ref 8.9–10.3)
Chloride: 105 mmol/L (ref 98–111)
Creatinine: 1.54 mg/dL — ABNORMAL HIGH (ref 0.44–1.00)
GFR, Est AFR Am: 39 mL/min — ABNORMAL LOW (ref >60)
GFR, Estimated: 33 mL/min — ABNORMAL LOW (ref >60)
Glucose, Bld: 343 mg/dL — ABNORMAL HIGH (ref 70–99)
Potassium: 4.7 mmol/L (ref 3.5–5.1)
Sodium: 137 mmol/L (ref 135–145)
Total Bilirubin: 0.3 mg/dL (ref 0.3–1.2)
Total Protein: 7.9 g/dL (ref 6.5–8.1)

## 2019-05-31 LAB — CBC WITH DIFFERENTIAL (CANCER CENTER ONLY)
Abs Immature Granulocytes: 0.02 10*3/uL (ref 0.00–0.07)
Basophils Absolute: 0 10*3/uL (ref 0.0–0.1)
Basophils Relative: 0 %
Eosinophils Absolute: 0.1 10*3/uL (ref 0.0–0.5)
Eosinophils Relative: 1 %
HCT: 30.9 % — ABNORMAL LOW (ref 36.0–46.0)
Hemoglobin: 9.8 g/dL — ABNORMAL LOW (ref 12.0–15.0)
Immature Granulocytes: 1 %
Lymphocytes Relative: 5 %
Lymphs Abs: 0.2 10*3/uL — ABNORMAL LOW (ref 0.7–4.0)
MCH: 27.5 pg (ref 26.0–34.0)
MCHC: 31.7 g/dL (ref 30.0–36.0)
MCV: 86.8 fL (ref 80.0–100.0)
Monocytes Absolute: 0.2 10*3/uL (ref 0.1–1.0)
Monocytes Relative: 6 %
Neutro Abs: 3.8 10*3/uL (ref 1.7–7.7)
Neutrophils Relative %: 87 %
Platelet Count: 197 10*3/uL (ref 150–400)
RBC: 3.56 MIL/uL — ABNORMAL LOW (ref 3.87–5.11)
RDW: 14 % (ref 11.5–15.5)
WBC Count: 4.3 10*3/uL (ref 4.0–10.5)
nRBC: 0 % (ref 0.0–0.2)

## 2019-05-31 LAB — TSH: TSH: 1.176 u[IU]/mL (ref 0.308–3.960)

## 2019-05-31 MED ORDER — INSULIN REGULAR HUMAN 100 UNIT/ML IJ SOLN
10.0000 [IU] | Freq: Once | INTRAMUSCULAR | Status: AC
  Administered 2019-05-31: 13:00:00 10 [IU] via SUBCUTANEOUS
  Filled 2019-05-31: qty 10

## 2019-05-31 MED ORDER — LEVOTHYROXINE SODIUM 75 MCG PO TABS: ORAL_TABLET | ORAL | 2 refills | Status: DC

## 2019-05-31 MED ORDER — INFLUENZA VAC A&B SA ADJ QUAD 0.5 ML IM PRSY
PREFILLED_SYRINGE | INTRAMUSCULAR | Status: AC
  Filled 2019-05-31: qty 0.5

## 2019-05-31 MED ORDER — SODIUM CHLORIDE 0.9 % IV SOLN
Freq: Once | INTRAVENOUS | Status: AC
  Administered 2019-05-31: 13:00:00 via INTRAVENOUS
  Filled 2019-05-31: qty 250

## 2019-05-31 MED ORDER — SODIUM CHLORIDE 0.9 % IV SOLN
11.0000 mg/kg | Freq: Once | INTRAVENOUS | Status: AC
  Administered 2019-05-31: 740 mg via INTRAVENOUS
  Filled 2019-05-31: qty 10

## 2019-05-31 MED ORDER — INFLUENZA VAC A&B SA ADJ QUAD 0.5 ML IM PRSY
0.5000 mL | PREFILLED_SYRINGE | Freq: Once | INTRAMUSCULAR | Status: AC
  Administered 2019-05-31: 0.5 mL via INTRAMUSCULAR

## 2019-05-31 NOTE — Progress Notes (Signed)
[name] OFFICE PROGRESS NOTE  Jenna Passy, MD [place] [address]  DIAGNOSIS: Stage IIIB (T3,N3, M0)non-small cell lung cancer, squamous cell carcinoma presented with large right middle lobe lungmassin addition to mediastinal and bilateral hilar lymphadenopathy and suspicious right supraclavicular lymph node diagnosed in August 2019.  PRIOR THERAPY: A course of concurrent chemoradiation with weekly carboplatin for AUC of 2 and paclitaxel 45 MG/M2.First dose of chemotherapy given on 05/29/2018. Status post 5 cycles.  CURRENT THERAPY: Consolidation treatment with immunotherapy with Imfinzi 10 mg/KG every 2 weeks. First dose August 10, 2018. Status post 21cycles.  INTERVAL HISTORY: Jenna [name] 73 y.o. female returns to the clinic for a follow up visit. The patient is feeling well without any concerning complaints today. She is tolerating her treatment with immunotherapy well without any adverse side effects. She denies any fever, chills, night sweats, or weight loss. She denies any chest pain or hemoptysis. She reports her baseline shortness of breath with exertion. She denies any nausea, vomiting, diarrhea, or constipation. She denies any headaches or visual changes. She denies any rashes or skin changes. The patient has diabetes and does not check her blood sugar at home despite counseling her about the importance of checking her blood sugar regularly. She denies any abdominal pain, polyuria, polyphagia, or polydipsia. 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 Havery Moros for this concern and is waiting to have a procedure from them until she feels more comfortable given the current COVID 19 pandemic. She still takes her iron supplements. She is here for evaluation before starting cycle #22.   MEDICAL HISTORY: Past Medical History:  Diagnosis Date  . Blood transfusion without reported diagnosis   .  Cataract   . DM (diabetes mellitus) ([facility])   . GERD (gastroesophageal reflux disease)   . Hyperlipidemia   . Hypertension   . Iron deficiency anemia   . NSCL ca dx'd 03/2018  . Thyroid disease     ALLERGIES:  is allergic to paclitaxel; aspirin; esomeprazole magnesium; and ace inhibitors.  MEDICATIONS:  Current Outpatient Medications  Medication Sig Dispense Refill  . dexlansoprazole (DEXILANT) 60 MG capsule Take 1qam ac 90 capsule 3  . diphenhydrAMINE (BENADRYL) 2 % cream Apply 1 application topically as needed for itching.    . docusate sodium (COLACE) 100 MG capsule Take 100 mg by mouth daily as needed for mild constipation.    . EUTHYROX 75 MCG tablet TAKE 1 TABLET BY MOUTH ONCE DAILY BEFORE BREAKFAST 30 tablet 0  . Ferrous Sulfate (IRON) 325 (65 Fe) MG TABS TAKE 1 TABLET BY MOUTH TWICE DAILY WITH A MEAL 180 tablet 0  . glipiZIDE (GLUCOTROL) 10 MG tablet TAKE 1 TABLET BY MOUTH TWICE DAILY BEFORE A MEAL 180 tablet 0  . labetalol (NORMODYNE) 100 MG tablet Take 1 tablet (100 mg total) by mouth 2 (two) times daily. 180 tablet 0  . Multiple Vitamin (MULTIVITAMIN WITH MINERALS) TABS tablet Take 1 tablet by mouth at bedtime.     . simvastatin (ZOCOR) 20 MG tablet Take 1 tablet (20 mg total) by mouth every evening. 90 tablet 1  . prochlorperazine (COMPAZINE) 10 MG tablet Take 1 tablet (10 mg total) by mouth every 6 (six) hours as needed for nausea or vomiting. (Patient not taking: Reported on 05/31/2019) 30 tablet 0  . sucralfate (CARAFATE) 1 g tablet Take 1 tablet (1 g total) by mouth 4 (four) times daily. (Patient not taking: Reported on  05/31/2019) 120 tablet 2   No current facility-administered medications for this visit.    Facility-Administered Medications Ordered in Other Visits  Medication Dose Route Frequency Provider Last Rate Last Dose  . durvalumab (IMFINZI) 740 mg in sodium chloride 0.9 % 100 mL chemo infusion  11 mg/kg (Treatment Plan Recorded) Intravenous Once Jenna Bears,  MD      . promethazine (PHENERGAN) injection 12.5 mg  12.5 mg Intravenous Once Jenna [name]., PA-C      . Tbo-Filgrastim (GRANIX) injection 300 mcg  300 mcg Subcutaneous Once Jenna Bears, MD        SURGICAL HISTORY:  Past Surgical History:  Procedure Laterality Date  . CATARACT EXTRACTION Right   . CHOLECYSTECTOMY    . COLONOSCOPY  10/24/2009   normal rectum/1X1cm abnormal lesion in the ascending colon (bx benign). TI normal for 10cm.  Prep difficult/inadequate. f/u TCS 09/2012 recommended  . COLONOSCOPY  10/13/2004   Normal rectum/Diminutive polyps, splenic flexure, cold biopsied/removed.  Remainder of colonic mucosa appeared normal.  . COLONOSCOPY N/A 12/14/2012   VQM:GQQPYPP polyp-tubular adenoma  . ESOPHAGOGASTRODUODENOSCOPY  10/13/2004    Normal esophagus/ Nodular volcano like lesion in the antrum, either representing a  pancreatic rest or leiomyoma, biopsied.  Remainder of the gastric mucosa appeared normal, normal D1-D2  . ESOPHAGOGASTRODUODENOSCOPY  10/24/2009   Benign biopsies. normal esophagus/small hiatal hernia/nodular lesion antrum/distal greater curvature. duodenal AVM s/p ablation  . GIVENS CAPSULE STUDY  07/27/2010    multiple arteriovenous malformations which could definitely be the contributor to her drifting hemoglobin and hematocrit  . TUBAL LIGATION    . VIDEO BRONCHOSCOPY WITH ENDOBRONCHIAL ULTRASOUND N/A 04/27/2018   Procedure: VIDEO BRONCHOSCOPY WITH ENDOBRONCHIAL ULTRASOUND;  Surgeon: Melrose Nakayama, MD;  Location: [facility] OR;  Service: Thoracic;  Laterality: N/A;    REVIEW OF SYSTEMS:   Review of Systems  Constitutional: Negative for appetite change, chills, fatigue, fever and unexpected weight change.  HENT: Negative for mouth sores, nosebleeds, sore throat and trouble swallowing.   Eyes: Negative for eye problems and icterus.  Respiratory: Positive for shortness of breath with exertion and baseline cough. Negative for hemoptysis, and wheezing.    Cardiovascular: Negative for chest pain and leg swelling.  Gastrointestinal: Negative for abdominal pain, constipation, diarrhea, nausea and vomiting.  Genitourinary: Negative for bladder incontinence, difficulty urinating, dysuria, frequency and hematuria.   Musculoskeletal: Negative for back pain, gait problem, neck pain and neck stiffness.  Skin: Negative for itching and rash.  Neurological: Negative for dizziness, extremity weakness, gait problem, headaches, light-headedness and seizures.  Hematological: Negative for adenopathy. Does not bruise/bleed easily.  Psychiatric/Behavioral: Negative for confusion, depression and sleep disturbance. The patient is not nervous/anxious.     PHYSICAL EXAMINATION:  Blood pressure (!) 150/79, pulse (!) 104, temperature 98 F (36.7 C), temperature source Temporal, resp. rate 17, height 5\' 5"  (1.651 m), weight 175 lb 11.2 oz (79.7 kg), SpO2 99 %.  ECOG PERFORMANCE STATUS: 1 - Symptomatic but completely ambulatory  Physical Exam  Constitutional: Oriented to person, place, and time and well-developed, well-nourished, and in no distress.  HENT:  Head: Normocephalic and atraumatic.  Mouth/Throat: Oropharynx is clear and moist. No oropharyngeal exudate.  Eyes: Conjunctivae are normal. Right eye exhibits no discharge. Left eye exhibits no discharge. No scleral icterus.  Neck: Normal range of motion. Neck supple.  Cardiovascular: Normal rate, regular rhythm, normal heart sounds and intact distal pulses.   Pulmonary/Chest: Effort normal and breath sounds normal. No respiratory distress. No wheezes. No rales.  Abdominal: Soft. Bowel sounds are normal. Exhibits no distension and no mass. There is no tenderness.  Musculoskeletal: Normal range of motion. Exhibits no edema.  Lymphadenopathy:    No cervical adenopathy.  Neurological: Alert and oriented to person, place, and time. Exhibits normal muscle tone. Gait normal. Coordination normal.  Skin: Skin is warm  and dry. No rash noted. Not diaphoretic. No erythema. No pallor.  Psychiatric: Mood, memory and judgment normal.  Vitals reviewed.  LABORATORY DATA: Lab Results  Component Value Date   WBC 4.3 05/31/2019   HGB 9.8 (L) 05/31/2019   HCT 30.9 (L) 05/31/2019   MCV 86.8 05/31/2019   PLT 197 05/31/2019      Chemistry      Component Value Date/Time   NA 137 05/31/2019 1111   K 4.7 05/31/2019 1111   CL 105 05/31/2019 1111   CO2 22 05/31/2019 1111   BUN 22 05/31/2019 1111   BUN 12 12/10/2011 0919   CREATININE 1.54 (H) 05/31/2019 1111   CREATININE 0.72 12/10/2011 0919      Component Value Date/Time   CALCIUM 9.4 05/31/2019 1111   ALKPHOS 102 05/31/2019 1111   ALKPHOS 88 12/10/2011 0919   AST 20 05/31/2019 1111   ALT 15 05/31/2019 1111   BILITOT 0.3 05/31/2019 1111       RADIOGRAPHIC STUDIES:  No results found.   ASSESSMENT/PLAN:  This is a very pleasant 73 year old African-American female diagnosed with stageIIIbnon-small cell lung cancer, squamous cell carcinoma. She presented with a large right middle lobe lung mass in addition to mediastinal and bilateral hilar lymphadenopathy. She also hassuspicious right supraclavicular lymphadenopathy. She was diagnosed in August 2019.  She previously underwent concurrent chemoradiation with carboplatin and paclitaxel. She is status post 5 cycles with a partial response. She tolerated treatment fairlywell except for pancytopenia and fatigue.  The patient is currently undergoing consolidation immunotherapy with Imfinzi 10 mg/kg IV every 2 weeks. She is status post 19cycles. She has been tolerating it well without any adverse effects. I recommend that she proceed with cycle #21today as scheduled.  We will see the patient back for a follow up visit in 2 weeks for evaluation before cycle #23.   I strongly encouraged that the patient monitor her blood sugars closely at home. I discussed the importance of and the adverse long  term affects of hyperglycemia with the patient. She will receive an additional 10 units of insulin while in the clinic today.   I have sent a refill for synthroid to the patient's pharmacy.   She was encouraged to continue taking her iron supplement and reach out to Dr. 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 Her hemoglobin is stable.   The patient was advised to call immediately if he has any concerning symptoms in the interval. The patient voices understanding of current disease status and treatment options and is in agreement with the current care plan. All questions were answered. The patient knows to call the clinic with any problems, questions or concerns. We can certainly see the patient much sooner if necessary  No orders of the defined types were placed in this encounter.    Jenna Lippy L Vertie Dibbern, PA-C 05/31/19

## 2019-05-31 NOTE — Telephone Encounter (Signed)
I spoke with patient and she is concerned about her BS levels and her Bp.  Pt explained that her BS was at 343 during her visit with Dr. Peggye Ley office.  Pt can not remember what her BP was but they told her that it was elevated.  Pt said that she is now taking Novolin during her visit to Dr. Peggye Ley office as well.  I informed pt that I would send Dr. Deborra Medina this message and get her feedback to as what pt concerns are. Please advise.

## 2019-05-31 NOTE — Progress Notes (Signed)
Ok to treat with creatinine at 1.54 per Dr. Julien Nordmann.

## 2019-05-31 NOTE — Telephone Encounter (Signed)
Copied from [place] [phone]. Topic: General - Call Back - No Documentation >> May 31, 2019  3:38 PM Erick Blinks wrote: Reason for CRM: Pt has lung cancer and was instructed to contact PCP about BS and BP, she has elevated levels. Please advise Requesting call back  Best contact: [phone]

## 2019-05-31 NOTE — Patient Instructions (Addendum)
[facility] Discharge Instructions for Patients Receiving Chemotherapy  Today you received the following chemotherapy agents:  Durvalumab  To help prevent nausea and vomiting after your treatment, we encourage you to take your nausea medication as prescribed.   If you develop nausea and vomiting that is not controlled by your nausea medication, call the clinic.   BELOW ARE SYMPTOMS THAT SHOULD BE REPORTED IMMEDIATELY:  *FEVER GREATER THAN 100.5 F  *CHILLS WITH OR WITHOUT FEVER  NAUSEA AND VOMITING THAT IS NOT CONTROLLED WITH YOUR NAUSEA MEDICATION  *UNUSUAL SHORTNESS OF BREATH  *UNUSUAL BRUISING OR BLEEDING  TENDERNESS IN MOUTH AND THROAT WITH OR WITHOUT PRESENCE OF ULCERS  *URINARY PROBLEMS  *BOWEL PROBLEMS  UNUSUAL RASH Items with * indicate a potential emergency and should be followed up as soon as possible.  Feel free to call the clinic should you have any questions or concerns. The clinic phone number is [phone].  Please show the [facility] at check-in to the Emergency Department and triage nurse.  Influenza Virus Vaccine injection What is this medicine? INFLUENZA VIRUS VACCINE (in floo EN zuh VAHY ruhs vak SEEN) helps to reduce the risk of getting influenza also known as the flu. The vaccine only helps protect you against some strains of the flu. This medicine may be used for other purposes; ask your health care provider or pharmacist if you have questions. COMMON BRAND NAME(S): Afluria, Afluria Quadrivalent, Agriflu, Alfuria, FLUAD, Fluarix, Fluarix Quadrivalent, Flublok, Flublok Quadrivalent, FLUCELVAX, Flulaval, Fluvirin, Fluzone, Fluzone High-Dose, Fluzone Intradermal What should I tell my health care provider before I take this medicine? They need to know if you have any of these conditions:  bleeding disorder like hemophilia  fever or infection  Guillain-Barre syndrome or other neurological problems  immune system  problems  infection with the human immunodeficiency virus (HIV) or AIDS  low blood platelet counts  multiple sclerosis  an unusual or allergic reaction to influenza virus vaccine, latex, other medicines, foods, dyes, or preservatives. Different brands of vaccines contain different allergens. Some may contain latex or eggs. Talk to your doctor about your allergies to make sure that you get the right vaccine.  pregnant or trying to get pregnant  breast-feeding How should I use this medicine? This vaccine is for injection into a muscle or under the skin. It is given by a health care professional. A copy of Vaccine Information Statements will be given before each vaccination. Read this sheet carefully each time. The sheet may change frequently. Talk to your healthcare provider to see which vaccines are right for you. Some vaccines should not be used in all age groups. Overdosage: If you think you have taken too much of this medicine contact a poison control center or emergency room at once. NOTE: This medicine is only for you. Do not share this medicine with others. What if I miss a dose? This does not apply. What may interact with this medicine?  chemotherapy or radiation therapy  medicines that lower your immune system like etanercept, anakinra, infliximab, and adalimumab  medicines that treat or prevent blood clots like warfarin  phenytoin  steroid medicines like prednisone or cortisone  theophylline  vaccines This list may not describe all possible interactions. Give your health care provider a list of all the medicines, herbs, non-prescription drugs, or dietary supplements you use. Also tell them if you smoke, drink alcohol, or use illegal drugs. Some items may interact with your medicine. What should I watch for while using  this medicine? Report any side effects that do not go away within 3 days to your doctor or health care professional. Call your health care provider if any  unusual symptoms occur within 6 weeks of receiving this vaccine. You may still catch the flu, but the illness is not usually as bad. You cannot get the flu from the vaccine. The vaccine will not protect against colds or other illnesses that may cause fever. The vaccine is needed every year. What side effects may I notice from receiving this medicine? Side effects that you should report to your doctor or health care professional as soon as possible:  allergic reactions like skin rash, itching or hives, swelling of the face, lips, or tongue Side effects that usually do not require medical attention (report to your doctor or health care professional if they continue or are bothersome):  fever  headache  muscle aches and pains  pain, tenderness, redness, or swelling at the injection site  tiredness This list may not describe all possible side effects. Call your doctor for medical advice about side effects. You may report side effects to FDA at 1-800-FDA-1088. Where should I keep my medicine? The vaccine will be given by a health care professional in a clinic, pharmacy, doctor's office, or other health care setting. You will not be given vaccine doses to store at home. NOTE: This sheet is a summary. It may not cover all possible information. If you have questions about this medicine, talk to your doctor, pharmacist, or health care provider.  2020 Elsevier/Gold Standard (2018-06-27 08:45:43)

## 2019-05-31 NOTE — Telephone Encounter (Signed)
Can we get pt in to see me next week?

## 2019-06-01 NOTE — Telephone Encounter (Signed)
I SPOKE W/PTAND SHE INFORMED ME THAT SHE WILL CALL OFFICE BACK TO SCHEDULE APPT.

## 2019-06-08 ENCOUNTER — Telehealth: Payer: Self-pay

## 2019-06-08 NOTE — Telephone Encounter (Signed)
Travel or Contacts:   Any travel in the past 2 weeks? NO Have you came in contact with anyone who has Covid? NO  Have you had a positive Covid test?NO If so, when?  Fever >100.28F []   Yes [x]   No []   Unknown  Chills []   Yes [x]   No []   Unknown  Muscle aches (myalgia) []   Yes [x]   No []   Unknown  Runny nose (rhinorrhea) []   Yes []   No []   Unknown  Sore throat []   Yes [x]   No []   Unknown Cough (new onset or worsening of chronic cough) []   Yes [x]   No []   Unknown  Shortness of breath (dyspnea) []   Yes [x]   No []   Unknown Nausea or vomiting []   Yes [x]   No []   Unknown  Headache []   Yes [x]   No []   Unknown  Abdominal pain  []   Yes [x]   No []   Unknown  Diarrhea (?3 loose/looser than normal stools/24hr period) []   Yes [x]   No []   Unknown Other, s:pecify

## 2019-06-10 DIAGNOSIS — I700 Atherosclerosis of aorta: Secondary | ICD-10-CM | POA: Insufficient documentation

## 2019-06-10 DIAGNOSIS — D61818 Other pancytopenia: Secondary | ICD-10-CM | POA: Insufficient documentation

## 2019-06-10 NOTE — Assessment & Plan Note (Addendum)
In small bowel but largest one in [place]. She is taking oral iron until she feel more comfortable having ablation done. Followed by Dr. Enis Gash (GI).

## 2019-06-10 NOTE — Assessment & Plan Note (Addendum)
Check a1c, urine miroalbumin today. Prevnar 13 given today. Foot exam done.  She will schedule eye appointment. No changes made to rxs today- elevation of blood sugar could have been to how she was taking her medications and recent infection.  Orders Placed This Encounter  Procedures  . Hepatitis C Antibody  . Microalbumin / creatinine urine ratio  . Hemoglobin A1c  . Lipid panel  . TSH  . T4, free  . Comprehensive metabolic panel

## 2019-06-10 NOTE — Assessment & Plan Note (Signed)
Followed by Dr. Inda Merlin.  Currently receiving chemotherapy.

## 2019-06-10 NOTE — Assessment & Plan Note (Signed)
Seen on CT of chest from 03/2019. She is on a statin, no longer smoking.

## 2019-06-10 NOTE — Progress Notes (Signed)
Subjective:   Patient ID: Jenna [name], female    DOB: [birth year], 73 y.o.   MRN: [id number]  Jenna [name] is a pleasant 73 y.o. year old female who presents to clinic today with Follow-up (Pt is here today to discuss HTN.  She states that she has had a problem with her BP when she takes her Thyroid med. She feels like it may also make her sugars high too. She takes Labetalol @ 7am ac; Dexilant 7:30am ac;Glipiizide 8am ac; Euthyroid 8:30am ac; eats then takes her Fe. She is unsure if this regimen needs to be rearanged? Is needing refills on everything.), Diabetes (due for prevnar 23. She got the PNV-13 in 2015 at [place]. She received her Flu shot 11-days-ago. urine micro, ?eye exam), and Abscess (She states that she has an abscess at her belly button. She was Tx with an abx but there is still yellow-green purulence and she keeps it cleaned with H2O2 and puts a cotton ball in it during the day.)  on 06/11/2019  HPI:  Last saw patient for videovisit on 11/21/18.  Note reviewed.  Hypothyroidism- At that OV, her TSH was very high. Synthroid 50 mcg daily was sent to her pharmacy but she was unaware and did not pick it up.  She is  Currently taking Levothyroxine 75 mg daily and is clinically euthyroid.  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 Inda Merlin.    Most recently seen by Roosevelt Locks, oncology PA on 05/31/19. Note reviewed. PRIOR THERAPY: A course of concurrent chemoradiation with weekly carboplatin for AUC of 2 and paclitaxel 45 MG/M2.First dose of chemotherapy given on 05/29/2018. Status post 5 cycles.  CURRENT THERAPY: Consolidation treatment with immunotherapy with Imfinzi 10 mg/KG every 2 weeks. First dose August 10, 2018. Status post21cycles.  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 Havery Moros.  Colonoscopy reviewed again from 12/19/18 which showed  the AVMs he felt were most likely attributing to her iron deficiency anemia.  Largest lesion seen in cecum.  Ablation was discussed after her colonoscopy in 12/2018.  She wants to hold off prior to considering ablation until after the pandemic.  She is taking oral iron- 325 mg twice daily with meals.  Umbilical abscess- noticed it her belly button is draining for 10 days.  Has been pouring peroxide.  NO fevers, no nausea.  Lab Results  Component Value Date   WBC 4.3 05/31/2019   HGB 9.8 (L) 05/31/2019   HCT 30.9 (L) 05/31/2019   MCV 86.8 05/31/2019   PLT 197 05/31/2019   Lab Results  Component Value Date   FERRITIN 51 04/19/2019   Patient did call on 10/115/20 with the following message:    I spoke with patient and she is concerned about her BS levels and her Bp.  Pt explained that her BS was at 343 during her visit with Dr. Peggye Ley office.  Pt can not remember what her BP was but they told her that it was elevated.  Pt said that she is now taking Novolin during her visit to Dr. Peggye Ley office as well.  I informed pt that I would send Dr. Deborra Medina this message and get her feedback to as what pt concerns are. Please advise.      DM-  Currently taking Glucotrol 10 mg twice daily. Does not check FSBS regularly.  I do not see novolin on her  list.She says she is no longer taking it.    Denies any episodes of hypoglycemia.  Allergic to ACEI.  Due for urine micro, prevnar 13.      Lab Results  Component Value Date   HGBA1C 7.8 (A) 03/20/2018   HTN- has been taking labetalolol 100 mg twice daily.    BP Readings from Last 3 Encounters:  06/11/19 (!) 146/78  05/31/19 (!) 150/79  05/17/19 (!) 160/73   Lab Results  Component Value Date   CREATININE 1.54 (H) 05/31/2019    HLD- taking zocor 20 mg daily.    Lab Results  Component Value Date   CHOL 123 03/20/2018   HDL 35.30 (L) 03/20/2018   LDLCALC 69 03/20/2018   TRIG 91.0 03/20/2018   CHOLHDL 3 03/20/2018   The ASCVD Risk score  Mikey Bussing DC Jr., et al., 2013) failed to calculate for the following reasons:   The valid total cholesterol range is 130 to 320 mg/dL  Current Outpatient Medications on File Prior to Visit  Medication Sig Dispense Refill  . dexlansoprazole (DEXILANT) 60 MG capsule Take 1qam ac 90 capsule 3  . diphenhydrAMINE (BENADRYL) 2 % cream Apply 1 application topically as needed for itching.    . docusate sodium (COLACE) 100 MG capsule Take 100 mg by mouth daily as needed for mild constipation.    . Ferrous Sulfate (IRON) 325 (65 Fe) MG TABS TAKE 1 TABLET BY MOUTH TWICE DAILY WITH A MEAL 180 tablet 0  . glipiZIDE (GLUCOTROL) 10 MG tablet TAKE 1 TABLET BY MOUTH TWICE DAILY BEFORE A MEAL 180 tablet 0  . labetalol (NORMODYNE) 100 MG tablet Take 1 tablet (100 mg total) by mouth 2 (two) times daily. 180 tablet 0  . levothyroxine (EUTHYROX) 75 MCG tablet TAKE 1 TABLET BY MOUTH ONCE DAILY BEFORE BREAKFAST 30 tablet 2  . Multiple Vitamin (MULTIVITAMIN WITH MINERALS) TABS tablet Take 1 tablet by mouth at bedtime.     . simvastatin (ZOCOR) 20 MG tablet Take 1 tablet (20 mg total) by mouth every evening. 90 tablet 1   Current Facility-Administered Medications on File Prior to Visit  Medication Dose Route Frequency Provider Last Rate Last Dose  . promethazine (PHENERGAN) injection 12.5 mg  12.5 mg Intravenous Once Harle Stanford., PA-C      . Tbo-Filgrastim (GRANIX) injection 300 mcg  300 mcg Subcutaneous Once Curt Bears, MD        Allergies  Allergen Reactions  . Paclitaxel Other (See Comments)    Unresponsiveness shortly after Taxol inf started 06/12/18.  . Aspirin Other (See Comments)    Stomach bleeding   . Esomeprazole Magnesium     UNSPECIFIED REACTION   . Ace Inhibitors Other (See Comments)    Dizziness, drunk like    Past Medical History:  Diagnosis Date  . Blood transfusion without reported diagnosis   . Cataract   . DM (diabetes mellitus) ([place])   . GERD (gastroesophageal reflux disease)    . Hyperlipidemia   . Hypertension   . Iron deficiency anemia   . NSCL ca dx'd 03/2018  . Thyroid disease     Past Surgical History:  Procedure Laterality Date  . CATARACT EXTRACTION Right   . CHOLECYSTECTOMY    . COLONOSCOPY  10/24/2009   normal rectum/1X1cm abnormal lesion in the ascending colon (bx benign). TI normal for 10cm.  Prep difficult/inadequate. f/u TCS 09/2012 recommended  . COLONOSCOPY  10/13/2004   Normal rectum/Diminutive polyps, splenic flexure, cold biopsied/removed.  Remainder of colonic  mucosa appeared normal.  . COLONOSCOPY N/A 12/14/2012   IOE:[id number] polyp-tubular adenoma  . ESOPHAGOGASTRODUODENOSCOPY  10/13/2004    Normal esophagus/ Nodular volcano like lesion in the antrum, either representing a  pancreatic rest or leiomyoma, biopsied.  Remainder of the gastric mucosa appeared normal, normal D1-D2  . ESOPHAGOGASTRODUODENOSCOPY  10/24/2009   Benign biopsies. normal esophagus/small hiatal hernia/nodular lesion antrum/distal greater curvature. duodenal AVM s/p ablation  . GIVENS CAPSULE STUDY  07/27/2010    multiple arteriovenous malformations which could definitely be the contributor to her drifting hemoglobin and hematocrit  . TUBAL LIGATION    . VIDEO BRONCHOSCOPY WITH ENDOBRONCHIAL ULTRASOUND N/A 04/27/2018   Procedure: VIDEO BRONCHOSCOPY WITH ENDOBRONCHIAL ULTRASOUND;  Surgeon: Melrose Nakayama, MD;  [facility] OR;  Service: Thoracic;  Laterality: N/A;    Family History  Problem Relation Age of Onset  . Colon cancer Maternal Aunt        greater than age 42  . Breast cancer Cousin   . Amblyopia Neg Hx   . Blindness Neg Hx   . Cataracts Neg Hx   . Diabetes Neg Hx   . Glaucoma Neg Hx   . Macular degeneration Neg Hx   . Retinal detachment Neg Hx   . Strabismus Neg Hx   . Retinitis pigmentosa Neg Hx   . Rectal cancer Neg Hx   . Stomach cancer Neg Hx   . Colon polyps Neg Hx   . Esophageal cancer Neg Hx     Social History   Socioeconomic History   . Marital status: Single    Spouse name: Not on file  . Number of children: Not on file  . Years of education: Not on file  . Highest education level: Not on file  Occupational History  . Not on file  Social Needs  . Financial resource strain: Not on file  . Food insecurity    Worry: Not on file    Inability: Not on file  . Transportation needs    Medical: No    Non-medical: No  Tobacco Use  . Smoking status: Former Smoker    Packs/day: 0.50    Years: 55.00    Pack years: 27.50    Types: Cigarettes    Quit date: 03/18/2018    Years since quitting: 1.2  . Smokeless tobacco: Never Used  . Tobacco comment: smoked off and n  Substance and Sexual Activity  . Alcohol use: No  . Drug use: No  . Sexual activity: Not on file  Lifestyle  . Physical activity    Days per week: Not on file    Minutes per session: Not on file  . Stress: Not on file  Relationships  . Social Herbalist on phone: Not on file    Gets together: Not on file    Attends religious service: Not on file    Active member of club or organization: Not on file    Attends meetings of clubs or organizations: Not on file    Relationship status: Not on file  . Intimate partner violence    Fear of current or ex partner: No    Emotionally abused: No    Physically abused: No    Forced sexual activity: No  Other Topics Concern  . Not on file  Social History Narrative  . Not on file   The PMH, PSH, Social History, Family History, Medications, and allergies have been reviewed in [facility], and have been updated if relevant.  Review of Systems  Constitutional: Negative.  Negative for fever.  HENT: Negative.   Eyes: Negative.   Respiratory: Negative.   Cardiovascular: Negative.   Gastrointestinal: Negative.   Endocrine: Negative.   Genitourinary: Negative.   Musculoskeletal: Negative.   Skin: Positive for wound.  Allergic/Immunologic: Negative.   Neurological: Positive for dizziness.  Hematological:  Negative.   Psychiatric/Behavioral: Negative.   All other systems reviewed and are negative.      Objective:    BP (!) 146/78 (BP Location: Left Arm, Patient Position: Sitting, Cuff Size: Normal)   Pulse 94   Temp 98.2 F (36.8 C) (Oral)   Wt 175 lb 6.4 oz (79.6 kg)   SpO2 98%   BMI 29.19 kg/m    Physical Exam   General:  Well-developed,well-nourished,in no acute distress; alert,appropriate and cooperative throughout examination Head:  normocephalic and atraumatic.   Eyes:  vision grossly intact, PERRL Ears:  R ear normal and L ear normal externally, TMs clear bilaterally Nose:  no external deformity.   Mouth:  good dentition.   Neck:  No deformities, masses, or tenderness noted. Breasts:  No mass, nodules, thickening, tenderness, bulging, retraction, inflamation, nipple discharge or skin changes noted.   Lungs:  Normal respiratory effort, chest expands symmetrically. Lungs are clear to auscultation, no crackles or wheezes. Heart:  Normal rate and regular rhythm. S1 and S2 normal without gallop, murmur, click, rub or other extra sounds. Abdomen: purulent drainage expressed from her umbilcus Msk:  No deformity or scoliosis noted of thoracic or lumbar spine.   Extremities:  No clubbing, cyanosis, edema, or deformity noted with normal full range of motion of all joints.   Neurologic:  alert & oriented X3 and gait normal.   Skin:  Intact without suspicious lesions or rashes Cervical Nodes:  No lymphadenopathy noted Axillary Nodes:  No palpable lymphadenopathy Psych:  Cognition and judgment appear intact. Alert and cooperative with normal attention span and concentration. 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)  Type 2 diabetes mellitus without complication, without long-term current use of insulin ([place]) - Plan: Microalbumin / creatinine urine ratio, Hemoglobin A1c  AVM  (arteriovenous malformation)  Iron deficiency anemia due to chronic blood loss  Hyperlipidemia, unspecified hyperlipidemia type - Plan: Lipid panel, Comprehensive metabolic panel  Hypothyroidism, unspecified type - Plan: TSH, T4, free  Essential hypertension  Other pancytopenia ([place]), Chronic  Need for hepatitis C screening test - Plan: Hepatitis C Antibody, CANCELED: Hepatitis C Antibody  Gastroesophageal reflux disease, unspecified whether esophagitis present  Aortic atherosclerosis (HCC)  Abscess, umbilical - Plan: Wound culture No follow-ups on file.

## 2019-06-10 NOTE — Assessment & Plan Note (Addendum)
She is taking high dose dexliant. I did explain that she should  take her levothyroxine greater than 30 min before breakfast, separated by at least 4 hours  from antacids, calcium, iron, and multivitamins.  She was not aware of this so we talked at length about her medication schedule- see AVS for details.

## 2019-06-10 NOTE — Patient Instructions (Addendum)
Great to see you!  Happy belated birthday! I will call you with your lab results from today and you can view them online.    Please take your euthyrox medication as soon as you wake up on an empty stomach- greater than 30 min before breakfast, separated by at least 4 hours  from antacids, calcium, iron, and multivitamins.  Then go ahead and eat breakfast-  Okay to take your glipizide with breakfast with and supper.  Okay to take your labetalol 50 mg twice daily and added Cozaar 25 mg daily with breakfast.  Take doxycyline as directed for your belly button infection- 1 tablet twice daily for 7 days and we will call you with culture results.  So take your dexilant and iron at least 4 hours after you take your thyroid medication.  We are decreasing your simvastatin to 10 mg nightly (okay to cut your 20 mg tablets in half).

## 2019-06-11 ENCOUNTER — Other Ambulatory Visit: Payer: Self-pay

## 2019-06-11 ENCOUNTER — Ambulatory Visit (INDEPENDENT_AMBULATORY_CARE_PROVIDER_SITE_OTHER): Payer: Medicare Other | Admitting: Family Medicine

## 2019-06-11 ENCOUNTER — Encounter: Payer: Self-pay | Admitting: Family Medicine

## 2019-06-11 VITALS — BP 146/78 | HR 94 | Temp 98.2°F | Wt 175.4 lb

## 2019-06-11 DIAGNOSIS — K219 Gastro-esophageal reflux disease without esophagitis: Secondary | ICD-10-CM

## 2019-06-11 DIAGNOSIS — Q2730 Arteriovenous malformation, site unspecified: Secondary | ICD-10-CM | POA: Diagnosis not present

## 2019-06-11 DIAGNOSIS — E785 Hyperlipidemia, unspecified: Secondary | ICD-10-CM | POA: Diagnosis not present

## 2019-06-11 DIAGNOSIS — Z1159 Encounter for screening for other viral diseases: Secondary | ICD-10-CM

## 2019-06-11 DIAGNOSIS — D61818 Other pancytopenia: Secondary | ICD-10-CM

## 2019-06-11 DIAGNOSIS — L02216 Cutaneous abscess of umbilicus: Secondary | ICD-10-CM | POA: Insufficient documentation

## 2019-06-11 DIAGNOSIS — I10 Essential (primary) hypertension: Secondary | ICD-10-CM

## 2019-06-11 DIAGNOSIS — E039 Hypothyroidism, unspecified: Secondary | ICD-10-CM

## 2019-06-11 DIAGNOSIS — I160 Hypertensive urgency: Secondary | ICD-10-CM

## 2019-06-11 DIAGNOSIS — E119 Type 2 diabetes mellitus without complications: Secondary | ICD-10-CM

## 2019-06-11 DIAGNOSIS — C342 Malignant neoplasm of middle lobe, bronchus or lung: Secondary | ICD-10-CM

## 2019-06-11 DIAGNOSIS — I700 Atherosclerosis of aorta: Secondary | ICD-10-CM

## 2019-06-11 DIAGNOSIS — D500 Iron deficiency anemia secondary to blood loss (chronic): Secondary | ICD-10-CM

## 2019-06-11 LAB — HEMOGLOBIN A1C: Hgb A1c MFr Bld: 9.8 % — ABNORMAL HIGH (ref 4.6–6.5)

## 2019-06-11 LAB — COMPREHENSIVE METABOLIC PANEL
ALT: 13 U/L (ref 0–35)
AST: 19 U/L (ref 0–37)
Albumin: 3.9 g/dL (ref 3.5–5.2)
Alkaline Phosphatase: 87 U/L (ref 39–117)
BUN: 20 mg/dL (ref 6–23)
CO2: 23 mEq/L (ref 19–32)
Calcium: 9.1 mg/dL (ref 8.4–10.5)
Chloride: 106 mEq/L (ref 96–112)
Creatinine, Ser: 1.09 mg/dL (ref 0.40–1.20)
GFR: 59.53 mL/min — ABNORMAL LOW (ref >60.00)
Glucose, Bld: 222 mg/dL — ABNORMAL HIGH (ref 70–99)
Potassium: 3.8 mEq/L (ref 3.5–5.1)
Sodium: 137 mEq/L (ref 135–145)
Total Bilirubin: 0.2 mg/dL (ref 0.2–1.2)
Total Protein: 7.8 g/dL (ref 6.0–8.3)

## 2019-06-11 LAB — LIPID PANEL
Cholesterol: 157 mg/dL (ref 0–200)
HDL: 39.2 mg/dL (ref >39.00)
LDL Cholesterol: 93 mg/dL (ref 0–99)
NonHDL: 118.19
Total CHOL/HDL Ratio: 4
Triglycerides: 124 mg/dL (ref 0.0–149.0)
VLDL: 24.8 mg/dL (ref 0.0–40.0)

## 2019-06-11 LAB — MICROALBUMIN / CREATININE URINE RATIO
Creatinine,U: 242.9 mg/dL
Microalb Creat Ratio: 15.4 mg/g (ref 0.0–30.0)
Microalb, Ur: 37.5 mg/dL — ABNORMAL HIGH (ref 0.0–1.9)

## 2019-06-11 LAB — TSH: TSH: 1.77 u[IU]/mL (ref 0.35–4.50)

## 2019-06-11 LAB — T4, FREE: Free T4: 0.72 ng/dL (ref 0.60–1.60)

## 2019-06-11 MED ORDER — LEVOTHYROXINE SODIUM 75 MCG PO TABS: ORAL_TABLET | ORAL | 2 refills | Status: DC

## 2019-06-11 MED ORDER — SIMVASTATIN 10 MG PO TABS: 10.0000 mg | ORAL_TABLET | Freq: Every day | ORAL | 3 refills | Status: DC

## 2019-06-11 MED ORDER — GLIPIZIDE 10 MG PO TABS: ORAL_TABLET | ORAL | 0 refills | Status: DC

## 2019-06-11 MED ORDER — LABETALOL HCL 100 MG PO TABS: 50.0000 mg | ORAL_TABLET | Freq: Two times a day (BID) | ORAL | 0 refills | Status: DC

## 2019-06-11 MED ORDER — IRON 325 (65 FE) MG PO TABS: ORAL_TABLET | ORAL | 0 refills | Status: DC

## 2019-06-11 MED ORDER — DOXYCYCLINE HYCLATE 100 MG PO TABS: 100.0000 mg | ORAL_TABLET | Freq: Two times a day (BID) | ORAL | 0 refills | Status: DC

## 2019-06-11 MED ORDER — LOSARTAN POTASSIUM 25 MG PO TABS: 25.0000 mg | ORAL_TABLET | Freq: Every day | ORAL | 3 refills | Status: DC

## 2019-06-11 NOTE — Assessment & Plan Note (Addendum)
On zocor- since lipid is well controlled and ASCVD 10 year risk score is reasonable, we did discuss decreasing her dose of zocor to 10 mg daily.  Orders Placed This Encounter  Procedures  . Wound culture  . Microalbumin / creatinine urine ratio  . Hemoglobin A1c  . Lipid panel  . TSH  . T4, free  . Comprehensive metabolic panel  . Hepatitis C Antibody

## 2019-06-11 NOTE — Assessment & Plan Note (Signed)
Now wish elevations in BP ( could be due to infection) but I am not comfortable increasing labetolol.  We agreed to decrease labetalol to 50 mg twice daily while adding cozaar 25 mg daily. The patient indicates understanding of these issues and agrees with the plan.

## 2019-06-11 NOTE — Assessment & Plan Note (Signed)
New- wound cx sent.  May also explain her elevated blood sugars. Start doxycyline 100 mg twice daily for seven days. Call or send my chart message prn if these symptoms worsen or fail to improve as anticipated. The patient indicates understanding of these issues and agrees with the plan.

## 2019-06-11 NOTE — Assessment & Plan Note (Signed)
Due for labs today. 

## 2019-06-12 ENCOUNTER — Other Ambulatory Visit: Payer: Self-pay | Admitting: Family Medicine

## 2019-06-12 DIAGNOSIS — Z1231 Encounter for screening mammogram for malignant neoplasm of breast: Secondary | ICD-10-CM

## 2019-06-14 ENCOUNTER — Inpatient Hospital Stay: Payer: Medicare Other

## 2019-06-14 ENCOUNTER — Other Ambulatory Visit: Payer: Self-pay

## 2019-06-14 ENCOUNTER — Inpatient Hospital Stay (HOSPITAL_BASED_OUTPATIENT_CLINIC_OR_DEPARTMENT_OTHER): Payer: Medicare Other | Admitting: Physician Assistant

## 2019-06-14 VITALS — BP 160/59 | HR 110 | Temp 98.0°F | Resp 18 | Ht 65.0 in | Wt 178.5 lb

## 2019-06-14 VITALS — HR 103

## 2019-06-14 DIAGNOSIS — C342 Malignant neoplasm of middle lobe, bronchus or lung: Secondary | ICD-10-CM

## 2019-06-14 DIAGNOSIS — Z5112 Encounter for antineoplastic immunotherapy: Secondary | ICD-10-CM

## 2019-06-14 LAB — CMP (CANCER CENTER ONLY)
ALT: 11 U/L (ref 0–44)
AST: 13 U/L — ABNORMAL LOW (ref 15–41)
Albumin: 3.4 g/dL — ABNORMAL LOW (ref 3.5–5.0)
Alkaline Phosphatase: 95 U/L (ref 38–126)
Anion gap: 8 (ref 5–15)
BUN: 21 mg/dL (ref 8–23)
CO2: 22 mmol/L (ref 22–32)
Calcium: 9 mg/dL (ref 8.9–10.3)
Chloride: 111 mmol/L (ref 98–111)
Creatinine: 1.3 mg/dL — ABNORMAL HIGH (ref 0.44–1.00)
GFR, Est AFR Am: 47 mL/min — ABNORMAL LOW (ref >60)
GFR, Estimated: 41 mL/min — ABNORMAL LOW (ref >60)
Glucose, Bld: 276 mg/dL — ABNORMAL HIGH (ref 70–99)
Potassium: 4 mmol/L (ref 3.5–5.1)
Sodium: 141 mmol/L (ref 135–145)
Total Bilirubin: <0.2 mg/dL — ABNORMAL LOW (ref 0.3–1.2)
Total Protein: 7.4 g/dL (ref 6.5–8.1)

## 2019-06-14 LAB — CBC WITH DIFFERENTIAL (CANCER CENTER ONLY)
Abs Immature Granulocytes: 0 10*3/uL (ref 0.00–0.07)
Basophils Absolute: 0 10*3/uL (ref 0.0–0.1)
Basophils Relative: 1 %
Eosinophils Absolute: 0.1 10*3/uL (ref 0.0–0.5)
Eosinophils Relative: 1 %
HCT: 30.2 % — ABNORMAL LOW (ref 36.0–46.0)
Hemoglobin: 9.2 g/dL — ABNORMAL LOW (ref 12.0–15.0)
Immature Granulocytes: 0 %
Lymphocytes Relative: 8 %
Lymphs Abs: 0.3 10*3/uL — ABNORMAL LOW (ref 0.7–4.0)
MCH: 27.1 pg (ref 26.0–34.0)
MCHC: 30.5 g/dL (ref 30.0–36.0)
MCV: 89.1 fL (ref 80.0–100.0)
Monocytes Absolute: 0.3 10*3/uL (ref 0.1–1.0)
Monocytes Relative: 7 %
Neutro Abs: 3.1 10*3/uL (ref 1.7–7.7)
Neutrophils Relative %: 83 %
Platelet Count: 194 10*3/uL (ref 150–400)
RBC: 3.39 MIL/uL — ABNORMAL LOW (ref 3.87–5.11)
RDW: 14.5 % (ref 11.5–15.5)
WBC Count: 3.7 10*3/uL — ABNORMAL LOW (ref 4.0–10.5)
nRBC: 0 % (ref 0.0–0.2)

## 2019-06-14 LAB — WOUND CULTURE
MICRO NUMBER:: 1029463
SPECIMEN QUALITY:: ADEQUATE

## 2019-06-14 MED ORDER — SODIUM CHLORIDE 0.9 % IV SOLN
10.8000 mg/kg | Freq: Once | INTRAVENOUS | Status: AC
  Administered 2019-06-14: 14:00:00 740 mg via INTRAVENOUS
  Filled 2019-06-14: qty 4.8

## 2019-06-14 MED ORDER — SODIUM CHLORIDE 0.9 % IV SOLN
Freq: Once | INTRAVENOUS | Status: AC
  Administered 2019-06-14: 13:00:00 via INTRAVENOUS
  Filled 2019-06-14: qty 250

## 2019-06-14 MED ORDER — INSULIN REGULAR HUMAN 100 UNIT/ML IJ SOLN
8.0000 [IU] | Freq: Once | INTRAMUSCULAR | Status: AC
  Administered 2019-06-14: 13:00:00 8 [IU] via SUBCUTANEOUS
  Filled 2019-06-14: qty 10

## 2019-06-14 NOTE — Progress Notes (Signed)
Ok to treat with HR today Per Cassie PA

## 2019-06-14 NOTE — Patient Instructions (Signed)
[facility] Discharge Instructions for Patients Receiving Chemotherapy  Today you received the following chemotherapy agents:  Durvalumab  To help prevent nausea and vomiting after your treatment, we encourage you to take your nausea medication as prescribed.   If you develop nausea and vomiting that is not controlled by your nausea medication, call the clinic.   BELOW ARE SYMPTOMS THAT SHOULD BE REPORTED IMMEDIATELY:  *FEVER GREATER THAN 100.5 F  *CHILLS WITH OR WITHOUT FEVER  NAUSEA AND VOMITING THAT IS NOT CONTROLLED WITH YOUR NAUSEA MEDICATION  *UNUSUAL SHORTNESS OF BREATH  *UNUSUAL BRUISING OR BLEEDING  TENDERNESS IN MOUTH AND THROAT WITH OR WITHOUT PRESENCE OF ULCERS  *URINARY PROBLEMS  *BOWEL PROBLEMS  UNUSUAL RASH Items with * indicate a potential emergency and should be followed up as soon as possible.  Feel free to call the clinic should you have any questions or concerns. The clinic phone number is [phone].  Please show the [facility] at check-in to the Emergency Department and triage nurse.  Influenza Virus Vaccine injection What is this medicine? INFLUENZA VIRUS VACCINE (in floo EN zuh VAHY ruhs vak SEEN) helps to reduce the risk of getting influenza also known as the flu. The vaccine only helps protect you against some strains of the flu. This medicine may be used for other purposes; ask your health care provider or pharmacist if you have questions. COMMON BRAND NAME(S): Afluria, Afluria Quadrivalent, Agriflu, Alfuria, FLUAD, Fluarix, Fluarix Quadrivalent, Flublok, Flublok Quadrivalent, FLUCELVAX, Flulaval, Fluvirin, Fluzone, Fluzone High-Dose, Fluzone Intradermal What should I tell my health care provider before I take this medicine? They need to know if you have any of these conditions:  bleeding disorder like hemophilia  fever or infection  Guillain-Barre syndrome or other neurological problems  immune system  problems  infection with the human immunodeficiency virus (HIV) or AIDS  low blood platelet counts  multiple sclerosis  an unusual or allergic reaction to influenza virus vaccine, latex, other medicines, foods, dyes, or preservatives. Different brands of vaccines contain different allergens. Some may contain latex or eggs. Talk to your doctor about your allergies to make sure that you get the right vaccine.  pregnant or trying to get pregnant  breast-feeding How should I use this medicine? This vaccine is for injection into a muscle or under the skin. It is given by a health care professional. A copy of Vaccine Information Statements will be given before each vaccination. Read this sheet carefully each time. The sheet may change frequently. Talk to your healthcare provider to see which vaccines are right for you. Some vaccines should not be used in all age groups. Overdosage: If you think you have taken too much of this medicine contact a poison control center or emergency room at once. NOTE: This medicine is only for you. Do not share this medicine with others. What if I miss a dose? This does not apply. What may interact with this medicine?  chemotherapy or radiation therapy  medicines that lower your immune system like etanercept, anakinra, infliximab, and adalimumab  medicines that treat or prevent blood clots like warfarin  phenytoin  steroid medicines like prednisone or cortisone  theophylline  vaccines This list may not describe all possible interactions. Give your health care provider a list of all the medicines, herbs, non-prescription drugs, or dietary supplements you use. Also tell them if you smoke, drink alcohol, or use illegal drugs. Some items may interact with your medicine. What should I watch for while using  this medicine? Report any side effects that do not go away within 3 days to your doctor or health care professional. Call your health care provider if any  unusual symptoms occur within 6 weeks of receiving this vaccine. You may still catch the flu, but the illness is not usually as bad. You cannot get the flu from the vaccine. The vaccine will not protect against colds or other illnesses that may cause fever. The vaccine is needed every year. What side effects may I notice from receiving this medicine? Side effects that you should report to your doctor or health care professional as soon as possible:  allergic reactions like skin rash, itching or hives, swelling of the face, lips, or tongue Side effects that usually do not require medical attention (report to your doctor or health care professional if they continue or are bothersome):  fever  headache  muscle aches and pains  pain, tenderness, redness, or swelling at the injection site  tiredness This list may not describe all possible side effects. Call your doctor for medical advice about side effects. You may report side effects to FDA at 1-800-FDA-1088. Where should I keep my medicine? The vaccine will be given by a health care professional in a clinic, pharmacy, doctor's office, or other health care setting. You will not be given vaccine doses to store at home. NOTE: This sheet is a summary. It may not cover all possible information. If you have questions about this medicine, talk to your doctor, pharmacist, or health care provider.  2020 Elsevier/Gold Standard (2018-06-27 08:45:43)

## 2019-06-14 NOTE — Progress Notes (Signed)
[facility] OFFICE PROGRESS NOTE  [name], MD [place] [address]  DIAGNOSIS: Stage IIIB (T3,N3, M0)non-small cell lung cancer, squamous cell carcinoma presented with large right middle lobe lungmassin addition to mediastinal and bilateral hilar lymphadenopathy and suspicious right supraclavicular lymph node diagnosed in August 2019.  PRIOR THERAPY: A course of concurrent chemoradiation with weekly carboplatin for AUC of 2 and paclitaxel 45 MG/M2.First dose of chemotherapy given on 05/29/2018. Status post 5 cycles.  CURRENT THERAPY: Consolidation treatment with immunotherapy with Imfinzi 10 mg/KG every 2 weeks. First dose August 10, 2018. Status post22cycles.  INTERVAL HISTORY: Jenna [name] 73 y.o. female returns to the clinic for a follow up visit. The patient is feeling well today without any concerning complaints. She recently had a visit with her PCP to have her routine evaluation and lab work performed.  Regarding her immunotherapy, the patient continues to tolerate treatment with  well without any adverse side effects. Denies any fever, chills, night sweats, or weight loss. Denies any chest pain, shortness of breath, cough, or hemoptysis. Denies any nausea, vomiting, diarrhea, or constipation. Denies any headache or visual changes. Denies any rashes or skin changes. She denies any polydipsia, polyuria, polyphagia, or mental status changes. The patient has diabetes and frequently has hyperglycemia on routine labs. The patient is here today for evaluation prior to starting cycle # 23  MEDICAL HISTORY: Past Medical History:  Diagnosis Date  . Blood transfusion without reported diagnosis   . Cataract   . DM (diabetes mellitus) ([place])   . GERD (gastroesophageal reflux disease)   . Hyperlipidemia   . Hypertension   . Iron deficiency anemia   . NSCL ca dx'd 03/2018  . Thyroid disease     ALLERGIES:  is allergic to  paclitaxel; aspirin; esomeprazole magnesium; and ace inhibitors.  MEDICATIONS:  Current Outpatient Medications  Medication Sig Dispense Refill  . dexlansoprazole (DEXILANT) 60 MG capsule Take 1qam ac 90 capsule 3  . diphenhydrAMINE (BENADRYL) 2 % cream Apply 1 application topically as needed for itching.    . docusate sodium (COLACE) 100 MG capsule Take 100 mg by mouth daily as needed for mild constipation.    [name] doxycycline (VIBRA-TABS) 100 MG tablet Take 1 tablet (100 mg total) by mouth 2 (two) times daily. 14 tablet 0  . Ferrous Sulfate (IRON) 325 (65 Fe) MG TABS TAKE 1 TABLET BY MOUTH TWICE DAILY WITH A MEAL 180 tablet 0  . glipiZIDE (GLUCOTROL) 10 MG tablet TAKE 1 TABLET BY MOUTH TWICE DAILY BEFORE A MEAL 180 tablet 0  . labetalol (NORMODYNE) 100 MG tablet Take 0.5 tablets (50 mg total) by mouth 2 (two) times daily. 90 tablet 0  . levothyroxine (EUTHYROX) 75 MCG tablet TAKE 1 TABLET BY MOUTH ONCE DAILY BEFORE BREAKFAST 30 tablet 2  . losartan (COZAAR) 25 MG tablet Take 1 tablet (25 mg total) by mouth daily. 30 tablet 3  . Multiple Vitamin (MULTIVITAMIN WITH MINERALS) TABS tablet Take 1 tablet by mouth at bedtime.     . simvastatin (ZOCOR) 10 MG tablet Take 1 tablet (10 mg total) by mouth at bedtime. 90 tablet 3   No current facility-administered medications for this visit.    Facility-Administered Medications Ordered in Other Visits  Medication Dose Route Frequency Provider Last Rate Last Dose  . durvalumab (IMFINZI) 740 mg in sodium chloride 0.9 % 100 mL chemo infusion  10.8 mg/kg (Treatment Plan Recorded) Intravenous Once Curt Bears, MD      .  promethazine (PHENERGAN) injection 12.5 mg  12.5 mg Intravenous Once Harle Stanford., PA-C      . Tbo-Filgrastim (GRANIX) injection 300 mcg  300 mcg Subcutaneous Once Curt Bears, MD        SURGICAL HISTORY:  Past Surgical History:  Procedure Laterality Date  . CATARACT EXTRACTION Right   . CHOLECYSTECTOMY    . COLONOSCOPY   10/24/2009   normal rectum/1X1cm abnormal lesion in the ascending colon (bx benign). TI normal for 10cm.  Prep difficult/inadequate. f/u TCS 09/2012 recommended  . COLONOSCOPY  10/13/2004   Normal rectum/Diminutive polyps, splenic flexure, cold biopsied/removed.  Remainder of colonic mucosa appeared normal.  . COLONOSCOPY N/A 12/14/2012   ONG:EXBMWUX polyp-tubular adenoma  . ESOPHAGOGASTRODUODENOSCOPY  10/13/2004    Normal esophagus/ Nodular volcano like lesion in the antrum, either representing a  pancreatic rest or leiomyoma, biopsied.  Remainder of the gastric mucosa appeared normal, normal D1-D2  . ESOPHAGOGASTRODUODENOSCOPY  10/24/2009   Benign biopsies. normal esophagus/small hiatal hernia/nodular lesion antrum/distal greater curvature. duodenal AVM s/p ablation  . GIVENS CAPSULE STUDY  07/27/2010    multiple arteriovenous malformations which could definitely be the contributor to her drifting hemoglobin and hematocrit  . TUBAL LIGATION    . VIDEO BRONCHOSCOPY WITH ENDOBRONCHIAL ULTRASOUND N/A 04/27/2018   Procedure: VIDEO BRONCHOSCOPY WITH ENDOBRONCHIAL ULTRASOUND;  Surgeon: Melrose Nakayama, MD;  Location: [facility] OR;  Service: Thoracic;  Laterality: N/A;    REVIEW OF SYSTEMS:   Review of Systems  Constitutional: Negative for appetite change, chills, fatigue, fever and unexpected weight change.  HENT: Negative for mouth sores, nosebleeds, sore throat and trouble swallowing.   Eyes: Negative for eye problems and icterus.  Respiratory: Negative for cough, hemoptysis, shortness of breath and wheezing.   Cardiovascular: Negative for chest pain and leg swelling.  Gastrointestinal: Negative for abdominal pain, constipation, diarrhea, nausea and vomiting.  Genitourinary: Negative for bladder incontinence, difficulty urinating, dysuria, frequency and hematuria.   Musculoskeletal: Negative for back pain, gait problem, neck pain and neck stiffness.  Skin: Negative for itching and rash.   Neurological: Negative for dizziness, extremity weakness, gait problem, headaches, light-headedness and seizures.  Hematological: Negative for adenopathy. Does not bruise/bleed easily.  Psychiatric/Behavioral: Negative for confusion, depression and sleep disturbance. The patient is not nervous/anxious.     PHYSICAL EXAMINATION:  Blood pressure (!) 160/59, pulse (!) 110, temperature 98 F (36.7 C), temperature source Temporal, resp. rate 18, height 5\' 5"  (1.651 m), weight 178 lb 8 oz (81 kg), SpO2 96 %.  ECOG PERFORMANCE STATUS: 1 - Symptomatic but completely ambulatory  Physical Exam  Constitutional: Oriented to person, place, and time and well-developed, well-nourished, and in no distress. No distress.  HENT:  Head: Normocephalic and atraumatic.  Mouth/Throat: Oropharynx is clear and moist. No oropharyngeal exudate.  Eyes: Conjunctivae are normal. Right eye exhibits no discharge. Left eye exhibits no discharge. No scleral icterus.  Neck: Normal range of motion. Neck supple.  Cardiovascular: Normal rate, regular rhythm, normal heart sounds and intact distal pulses.   Pulmonary/Chest: Effort normal and breath sounds normal. No respiratory distress. No wheezes. No rales.  Abdominal: Soft. Bowel sounds are normal. Exhibits no distension and no mass. There is no tenderness.  Musculoskeletal: Normal range of motion. Exhibits no edema.  Lymphadenopathy:    No cervical adenopathy.  Neurological: Alert and oriented to person, place, and time. Exhibits normal muscle tone. Gait normal. Coordination normal.  Skin: Skin is warm and dry. No rash noted. Not diaphoretic. No erythema. No pallor.  Psychiatric: Mood, memory and judgment normal.  Vitals reviewed.  LABORATORY DATA: Lab Results  Component Value Date   WBC 3.7 (L) 06/14/2019   HGB 9.2 (L) 06/14/2019   HCT 30.2 (L) 06/14/2019   MCV 89.1 06/14/2019   PLT 194 06/14/2019      Chemistry      Component Value Date/Time   NA 141  06/14/2019 1106   K 4.0 06/14/2019 1106   CL 111 06/14/2019 1106   CO2 22 06/14/2019 1106   BUN 21 06/14/2019 1106   BUN 12 12/10/2011 0919   CREATININE 1.30 (H) 06/14/2019 1106   CREATININE 0.72 12/10/2011 0919      Component Value Date/Time   CALCIUM 9.0 06/14/2019 1106   ALKPHOS 95 06/14/2019 1106   ALKPHOS 88 12/10/2011 0919   AST 13 (L) 06/14/2019 1106   ALT 11 06/14/2019 1106   BILITOT <0.2 (L) 06/14/2019 1106       RADIOGRAPHIC STUDIES:  No results found.   ASSESSMENT/PLAN:  This is a very pleasant 73 year old African-American female diagnosed with stageIIIbnon-small cell lung cancer, squamous cell carcinoma. She presented with a large right middle lobe lung mass in addition to mediastinal and bilateral hilar lymphadenopathy. She also hassuspicious right supraclavicular lymphadenopathy. She was diagnosed in August 2019.  She previously underwent concurrent chemoradiation with carboplatin and paclitaxel. She is status post 5 cycles with a partial response. She tolerated treatment fairlywell except for pancytopenia and fatigue.  The patient is currently undergoing consolidation immunotherapy with Imfinzi 10 mg/kg IV every 2 weeks. She is status post 22 cycles. She has been tolerating it well without any adverse effects. I recommend that she proceed with cycle #23 today as scheduled.  We will see her back for a follow up in 2 weeks for evaluation before starting cycle #24.   Her blood sugar was elevated today at 276. I have placed 8 units of insulin in for her to receive while in infusion today. Her blood pressure was also elevated in the clinic today. Her blood pressure is managed by her PCP and her BP medication was recently adjusted. I encouraged the patient to keep a log of her BP readings and to check it at home a few times a week.   The patient was advised to call immediately if she has any concerning symptoms in the interval. The patient voices  understanding of current disease status and treatment options and is in agreement with the current care plan. All questions were answered. The patient knows to call the clinic with any problems, questions or concerns. We can certainly see the patient much sooner if necessary  No orders of the defined types were placed in this encounter.     L , PA-C 06/14/19

## 2019-06-15 ENCOUNTER — Telehealth: Payer: Self-pay | Admitting: Physician Assistant

## 2019-06-15 NOTE — Telephone Encounter (Signed)
Scheduled per los. Called and left msg. Mailed printout  °

## 2019-06-18 ENCOUNTER — Other Ambulatory Visit: Payer: Self-pay | Admitting: Family Medicine

## 2019-06-18 MED ORDER — BLOOD GLUCOSE MONITOR KIT: PACK | 0 refills | Status: DC

## 2019-06-18 MED ORDER — MUPIROCIN 2 % EX OINT: 1.0000 "application " | TOPICAL_OINTMENT | Freq: Two times a day (BID) | CUTANEOUS | 0 refills | Status: DC

## 2019-06-18 MED ORDER — MUPIROCIN 2 % EX OINT: TOPICAL_OINTMENT | CUTANEOUS | 0 refills | Status: DC

## 2019-06-18 MED ORDER — CANAGLIFLOZIN 100 MG PO TABS: 100.0000 mg | ORAL_TABLET | Freq: Every day | ORAL | 3 refills | Status: DC

## 2019-06-21 ENCOUNTER — Telehealth: Payer: Self-pay

## 2019-06-21 NOTE — Telephone Encounter (Signed)
Copied from [place] [id number]. Topic: General - Inquiry >> Jun 20, 2019 12:45 PM Alease Frame wrote: Reason for CRM: Patient called in regarding her recent medication . She would like doctor to call back as soon as possible >> Jun 21, 2019 10:16 AM Richardo Priest, NT wrote: Pt called in stating she would like to discuss with CMA the prescription for her naval. Pt states that prescription directions are wrong and wants clarification. PT also has some complaints about prescriptions. Please advise.

## 2019-06-21 NOTE — Telephone Encounter (Signed)
TA-I talked to pt and advised that I placed a BS meter at the front for her since the pharmacy didn't have one/I reiterated instructions of Mupirocin ointment bid to wound/answered questions about [employer] Rx that she had and although she was scared when reading the warnings she will try it and call if any concerns arise/plz advise if there is anything that you would like to add/thx dmf

## 2019-06-27 ENCOUNTER — Inpatient Hospital Stay: Payer: Medicare Other

## 2019-06-27 ENCOUNTER — Inpatient Hospital Stay (HOSPITAL_BASED_OUTPATIENT_CLINIC_OR_DEPARTMENT_OTHER): Payer: Medicare Other | Admitting: Internal Medicine

## 2019-06-27 ENCOUNTER — Inpatient Hospital Stay: Payer: Medicare Other | Attending: Physician Assistant

## 2019-06-27 ENCOUNTER — Encounter: Payer: Self-pay | Admitting: Internal Medicine

## 2019-06-27 ENCOUNTER — Other Ambulatory Visit: Payer: Self-pay

## 2019-06-27 VITALS — BP 144/65 | HR 109 | Temp 98.2°F | Resp 18 | Ht 65.0 in | Wt 175.6 lb

## 2019-06-27 DIAGNOSIS — Z7984 Long term (current) use of oral hypoglycemic drugs: Secondary | ICD-10-CM | POA: Diagnosis not present

## 2019-06-27 DIAGNOSIS — E785 Hyperlipidemia, unspecified: Secondary | ICD-10-CM | POA: Diagnosis not present

## 2019-06-27 DIAGNOSIS — Z5112 Encounter for antineoplastic immunotherapy: Secondary | ICD-10-CM

## 2019-06-27 DIAGNOSIS — C342 Malignant neoplasm of middle lobe, bronchus or lung: Secondary | ICD-10-CM

## 2019-06-27 DIAGNOSIS — I10 Essential (primary) hypertension: Secondary | ICD-10-CM | POA: Diagnosis not present

## 2019-06-27 DIAGNOSIS — E079 Disorder of thyroid, unspecified: Secondary | ICD-10-CM | POA: Diagnosis not present

## 2019-06-27 DIAGNOSIS — E119 Type 2 diabetes mellitus without complications: Secondary | ICD-10-CM | POA: Diagnosis not present

## 2019-06-27 DIAGNOSIS — D61818 Other pancytopenia: Secondary | ICD-10-CM | POA: Diagnosis not present

## 2019-06-27 DIAGNOSIS — E039 Hypothyroidism, unspecified: Secondary | ICD-10-CM | POA: Diagnosis not present

## 2019-06-27 DIAGNOSIS — Z79899 Other long term (current) drug therapy: Secondary | ICD-10-CM | POA: Diagnosis not present

## 2019-06-27 DIAGNOSIS — R5382 Chronic fatigue, unspecified: Secondary | ICD-10-CM

## 2019-06-27 LAB — CBC WITH DIFFERENTIAL (CANCER CENTER ONLY)
Abs Immature Granulocytes: 0.02 10*3/uL (ref 0.00–0.07)
Basophils Absolute: 0 10*3/uL (ref 0.0–0.1)
Basophils Relative: 1 %
Eosinophils Absolute: 0.1 10*3/uL (ref 0.0–0.5)
Eosinophils Relative: 2 %
HCT: 34.4 % — ABNORMAL LOW (ref 36.0–46.0)
Hemoglobin: 10.7 g/dL — ABNORMAL LOW (ref 12.0–15.0)
Immature Granulocytes: 1 %
Lymphocytes Relative: 8 %
Lymphs Abs: 0.4 10*3/uL — ABNORMAL LOW (ref 0.7–4.0)
MCH: 27.6 pg (ref 26.0–34.0)
MCHC: 31.1 g/dL (ref 30.0–36.0)
MCV: 88.7 fL (ref 80.0–100.0)
Monocytes Absolute: 0.3 10*3/uL (ref 0.1–1.0)
Monocytes Relative: 6 %
Neutro Abs: 3.5 10*3/uL (ref 1.7–7.7)
Neutrophils Relative %: 82 %
Platelet Count: 221 10*3/uL (ref 150–400)
RBC: 3.88 MIL/uL (ref 3.87–5.11)
RDW: 14.6 % (ref 11.5–15.5)
WBC Count: 4.2 10*3/uL (ref 4.0–10.5)
nRBC: 0 % (ref 0.0–0.2)

## 2019-06-27 LAB — CMP (CANCER CENTER ONLY)
ALT: 13 U/L (ref 0–44)
AST: 17 U/L (ref 15–41)
Albumin: 3.8 g/dL (ref 3.5–5.0)
Alkaline Phosphatase: 95 U/L (ref 38–126)
Anion gap: 11 (ref 5–15)
BUN: 28 mg/dL — ABNORMAL HIGH (ref 8–23)
CO2: 21 mmol/L — ABNORMAL LOW (ref 22–32)
Calcium: 9.5 mg/dL (ref 8.9–10.3)
Chloride: 104 mmol/L (ref 98–111)
Creatinine: 1.57 mg/dL — ABNORMAL HIGH (ref 0.44–1.00)
GFR, Est AFR Am: 38 mL/min — ABNORMAL LOW (ref >60)
GFR, Estimated: 32 mL/min — ABNORMAL LOW (ref >60)
Glucose, Bld: 306 mg/dL — ABNORMAL HIGH (ref 70–99)
Potassium: 4.5 mmol/L (ref 3.5–5.1)
Sodium: 136 mmol/L (ref 135–145)
Total Bilirubin: 0.3 mg/dL (ref 0.3–1.2)
Total Protein: 8.4 g/dL — ABNORMAL HIGH (ref 6.5–8.1)

## 2019-06-27 LAB — TSH: TSH: 2.845 u[IU]/mL (ref 0.308–3.960)

## 2019-06-27 MED ORDER — SODIUM CHLORIDE 0.9 % IV SOLN
Freq: Once | INTRAVENOUS | Status: AC
  Administered 2019-06-27: 11:00:00 via INTRAVENOUS
  Filled 2019-06-27: qty 250

## 2019-06-27 MED ORDER — SODIUM CHLORIDE 0.9 % IV SOLN
10.8000 mg/kg | Freq: Once | INTRAVENOUS | Status: AC
  Administered 2019-06-27: 740 mg via INTRAVENOUS
  Filled 2019-06-27: qty 10

## 2019-06-27 NOTE — Progress Notes (Signed)
Per Dr. Julien Nordmann, ok to treat with Scr 1.57 and HR 109.

## 2019-06-27 NOTE — Progress Notes (Signed)
[name] [phone]   [phone]  OFFICE PROGRESS NOTE  Jenna Passy, MD [place] [address]  DIAGNOSIS: Stage IIIB (T3,N3, M0)non-small cell lung cancer, squamous cell carcinoma presented with large right middle lobe lung breast in addition to mediastinal and bilateral hilar lymphadenopathy and suspicious right supraclavicular lymph node diagnosed in August 2019.   PRIOR THERAPY: A course of concurrent chemoradiation with weekly carboplatin for AUC of 2 and paclitaxel 45 MG/M2.  First dose of chemotherapy given on 05/29/2018.  Status post 5 cycles.  CURRENT THERAPY:  Consolidation treatment with immunotherapy with Imfinzi 10 mg/KG every 2 weeks.  First dose August 10, 2018.  Status post 23 cycles.  INTERVAL HISTORY: Jenna [name] 73 y.o. female returns to the clinic today for follow-up visit.  The patient is feeling fine today with no concerning complaints.  She denied having any chest pain, shortness of breath, cough or hemoptysis.  She denied having any fever or chills.  She has no nausea, vomiting, diarrhea or constipation.  She denied having any headache or visual changes.  The patient continues to tolerate her treatment with Imfinzi fairly well.  She is here today for evaluation before starting cycle #24. Jenna [name]  MEDICAL HISTORY: Past Medical History:  Diagnosis Date  . Blood transfusion without reported diagnosis   . Cataract   . DM (diabetes mellitus) ([place])   . GERD (gastroesophageal reflux disease)   . Hyperlipidemia   . Hypertension   . Iron deficiency anemia   . NSCL ca dx'd 03/2018  . Thyroid disease     ALLERGIES:  is allergic to paclitaxel; aspirin; esomeprazole magnesium; and ace inhibitors.  MEDICATIONS:  Current Outpatient Medications  Medication Sig Dispense Refill  . blood glucose meter kit and supplies KIT Use up to four times daily as directed 1 each 0  . canagliflozin (INVOKANA)  100 MG TABS tablet Take 1 tablet (100 mg total) by mouth daily before breakfast. 30 tablet 3  . dexlansoprazole (DEXILANT) 60 MG capsule Take 1qam ac 90 capsule 3  . diphenhydrAMINE (BENADRYL) 2 % cream Apply 1 application topically as needed for itching.    . docusate sodium (COLACE) 100 MG capsule Take 100 mg by mouth daily as needed for mild constipation.    Jenna [name] doxycycline (VIBRA-TABS) 100 MG tablet Take 1 tablet (100 mg total) by mouth 2 (two) times daily. 14 tablet 0  . Ferrous Sulfate (IRON) 325 (65 Fe) MG TABS TAKE 1 TABLET BY MOUTH TWICE DAILY WITH A MEAL 180 tablet 0  . glipiZIDE (GLUCOTROL) 10 MG tablet TAKE 1 TABLET BY MOUTH TWICE DAILY BEFORE A MEAL 180 tablet 0  . labetalol (NORMODYNE) 100 MG tablet Take 0.5 tablets (50 mg total) by mouth 2 (two) times daily. 90 tablet 0  . levothyroxine (EUTHYROX) 75 MCG tablet TAKE 1 TABLET BY MOUTH ONCE DAILY BEFORE BREAKFAST 30 tablet 2  . losartan (COZAAR) 25 MG tablet Take 1 tablet (25 mg total) by mouth daily. 30 tablet 3  . Multiple Vitamin (MULTIVITAMIN WITH MINERALS) TABS tablet Take 1 tablet by mouth at bedtime.     . mupirocin ointment (BACTROBAN) 2 % Apply into wound twice daily. 22 g 0  . simvastatin (ZOCOR) 10 MG tablet Take 1 tablet (10 mg total) by mouth at bedtime. 90 tablet 3   No current facility-administered medications for this visit.    Facility-Administered Medications Ordered in Other Visits  Medication Dose Route Frequency  Provider Last Rate Last Dose  . promethazine (PHENERGAN) injection 12.5 mg  12.5 mg Intravenous Once Harle Stanford., PA-C      . Tbo-Filgrastim (GRANIX) injection 300 mcg  300 mcg Subcutaneous Once Curt Bears, MD        SURGICAL HISTORY:  Past Surgical History:  Procedure Laterality Date  . CATARACT EXTRACTION Right   . CHOLECYSTECTOMY    . COLONOSCOPY  10/24/2009   normal rectum/1X1cm abnormal lesion in the ascending colon (bx benign). TI normal for 10cm.  Prep difficult/inadequate. f/u TCS  09/2012 recommended  . COLONOSCOPY  10/13/2004   Normal rectum/Diminutive polyps, splenic flexure, cold biopsied/removed.  Remainder of colonic mucosa appeared normal.  . COLONOSCOPY N/A 12/14/2012   AGT:[id number] polyp-tubular adenoma  . ESOPHAGOGASTRODUODENOSCOPY  10/13/2004    Normal esophagus/ Nodular volcano like lesion in the antrum, either representing a  pancreatic rest or leiomyoma, biopsied.  Remainder of the gastric mucosa appeared normal, normal D1-D2  . ESOPHAGOGASTRODUODENOSCOPY  10/24/2009   Benign biopsies. normal esophagus/small hiatal hernia/nodular lesion antrum/distal greater curvature. duodenal AVM s/p ablation  . GIVENS CAPSULE STUDY  07/27/2010    multiple arteriovenous malformations which could definitely be the contributor to her drifting hemoglobin and hematocrit  . TUBAL LIGATION    . VIDEO BRONCHOSCOPY WITH ENDOBRONCHIAL ULTRASOUND N/A 04/27/2018   Procedure: VIDEO BRONCHOSCOPY WITH ENDOBRONCHIAL ULTRASOUND;  Surgeon: Melrose Nakayama, MD;  Location: [facility] OR;  Service: Thoracic;  Laterality: N/A;    REVIEW OF SYSTEMS:  A comprehensive review of systems was negative.   PHYSICAL EXAMINATION: General appearance: alert, cooperative, fatigued and no distress Head: Normocephalic, without obvious abnormality, atraumatic Neck: no adenopathy, no JVD, supple, symmetrical, trachea midline and thyroid not enlarged, symmetric, no tenderness/mass/nodules Lymph nodes: Cervical, supraclavicular, and axillary nodes normal. Resp: clear to auscultation bilaterally Back: symmetric, no curvature. ROM normal. No CVA tenderness. Cardio: regular rate and rhythm, S1, S2 normal, no murmur, click, rub or gallop GI: soft, non-tender; bowel sounds normal; no masses,  no organomegaly Extremities: extremities normal, atraumatic, no cyanosis or edema  ECOG PERFORMANCE STATUS: 1 - Symptomatic but completely ambulatory  Blood pressure (!) 144/65, pulse (!) 109, temperature 98.2 F (36.8 C),  temperature source Temporal, resp. rate 18, height _0  (1.651 m), weight 175 lb 9.6 oz (79.7 kg), SpO2 98 %.  LABORATORY DATA: Lab Results  Component Value Date   WBC 4.2 06/27/2019   HGB 10.7 (L) 06/27/2019   HCT 34.4 (L) 06/27/2019   MCV 88.7 06/27/2019   PLT 221 06/27/2019      Chemistry      Component Value Date/Time   NA 141 06/14/2019 1106   K 4.0 06/14/2019 1106   CL 111 06/14/2019 1106   CO2 22 06/14/2019 1106   BUN 21 06/14/2019 1106   BUN 12 12/10/2011 0919   CREATININE 1.30 (H) 06/14/2019 1106   CREATININE 0.72 12/10/2011 0919      Component Value Date/Time   CALCIUM 9.0 06/14/2019 1106   ALKPHOS 95 06/14/2019 1106   ALKPHOS 88 12/10/2011 0919   AST 13 (L) 06/14/2019 1106   ALT 11 06/14/2019 1106   BILITOT <0.2 (L) 06/14/2019 1106       RADIOGRAPHIC STUDIES: No results found.  ASSESSMENT AND PLAN: This is a very pleasant 73 years old African-American female recently diagnosed with a stage IIIB non-small cell lung cancer, squamous cell carcinoma.  She completed a course of concurrent chemoradiation with weekly carboplatin and paclitaxel status post 5 cycles with partial response.  She tolerated this treatment well except for the pancytopenia and fatigue.  The patient is currently undergoing treatment with consolidation immunotherapy with Imfinzi status post 23 cycles. The patient continues to tolerate this treatment well with no concerning adverse effects. I recommended for her to proceed with cycle #24 today as planned. She will come back for follow-up visit in 2 weeks for evaluation before the next cycle of her treatment. The patient was advised to call immediately if she has any concerning symptoms in the interval. The patient voices understanding of current disease status and treatment options and is in agreement with the current care plan. All questions were answered. The patient knows to call the clinic with any problems, questions or concerns. We can  certainly see the patient much sooner if necessary.  Disclaimer: This note was dictated with voice recognition software. Similar sounding words can inadvertently be transcribed and may not be corrected upon review.

## 2019-06-27 NOTE — Patient Instructions (Signed)
[facility] Discharge Instructions for Patients Receiving Chemotherapy  Today you received the following chemotherapy agents:  Durvalumab  To help prevent nausea and vomiting after your treatment, we encourage you to take your nausea medication as prescribed.   If you develop nausea and vomiting that is not controlled by your nausea medication, call the clinic.   BELOW ARE SYMPTOMS THAT SHOULD BE REPORTED IMMEDIATELY:  *FEVER GREATER THAN 100.5 F  *CHILLS WITH OR WITHOUT FEVER  NAUSEA AND VOMITING THAT IS NOT CONTROLLED WITH YOUR NAUSEA MEDICATION  *UNUSUAL SHORTNESS OF BREATH  *UNUSUAL BRUISING OR BLEEDING  TENDERNESS IN MOUTH AND THROAT WITH OR WITHOUT PRESENCE OF ULCERS  *URINARY PROBLEMS  *BOWEL PROBLEMS  UNUSUAL RASH Items with * indicate a potential emergency and should be followed up as soon as possible.  Feel free to call the clinic should you have any questions or concerns. The clinic phone number is [phone].  Please show the [facility] at check-in to the Emergency Department and triage nurse.

## 2019-06-27 NOTE — Patient Instructions (Signed)
Steps to Quit Smoking Smoking tobacco is the leading cause of preventable death. It can affect almost every organ in the body. Smoking puts you and people around you at risk for many serious, long-lasting (chronic) diseases. Quitting smoking can be hard, but it is one of the best things that you can do for your health. It is never too late to quit. How do I get ready to quit? When you decide to quit smoking, make a plan to help you succeed. Before you quit:  Pick a date to quit. Set a date within the next 2 weeks to give you time to prepare.  Write down the reasons why you are quitting. Keep this list in places where you will see it often.  Tell your family, friends, and co-workers that you are quitting. Their support is important.  Talk with your doctor about the choices that may help you quit.  Find out if your health insurance will pay for these treatments.  Know the people, places, things, and activities that make you want to smoke (triggers). Avoid them. What first steps can I take to quit smoking?  Throw away all cigarettes at home, at work, and in your car.  Throw away the things that you use when you smoke, such as ashtrays and lighters.  Clean your car. Make sure to empty the ashtray.  Clean your home, including curtains and carpets. What can I do to help me quit smoking? Talk with your doctor about taking medicines and seeing a counselor at the same time. You are more likely to succeed when you do both.  If you are pregnant or breastfeeding, talk with your doctor about counseling or other ways to quit smoking. Do not take medicine to help you quit smoking unless your doctor tells you to do so. To quit smoking: Quit right away  Quit smoking totally, instead of slowly cutting back on how much you smoke over a period of time.  Go to counseling. You are more likely to quit if you go to counseling sessions regularly. Take medicine You may take medicines to help you quit. Some  medicines need a prescription, and some you can buy over-the-counter. Some medicines may contain a drug called nicotine to replace the nicotine in cigarettes. Medicines may:  Help you to stop having the desire to smoke (cravings).  Help to stop the problems that come when you stop smoking (withdrawal symptoms). Your doctor may ask you to use:  Nicotine patches, gum, or lozenges.  Nicotine inhalers or sprays.  Non-nicotine medicine that is taken by mouth. Find resources Find resources and other ways to help you quit smoking and remain smoke-free after you quit. These resources are most helpful when you use them often. They include:  Online chats with a counselor.  Phone quitlines.  Printed self-help materials.  Support groups or group counseling.  Text messaging programs.  Mobile phone apps. Use apps on your mobile phone or tablet that can help you stick to your quit plan. There are many free apps for mobile phones and tablets as well as websites. Examples include Quit Guide from the CDC and smokefree.gov  What things can I do to make it easier to quit?   Talk to your family and friends. Ask them to support and encourage you.  Call a phone quitline (1-800-QUIT-NOW), reach out to support groups, or work with a counselor.  Ask people who smoke to not smoke around you.  Avoid places that make you want to smoke,   such as: ? Bars. ? Parties. ? Smoke-break areas at work.  Spend time with people who do not smoke.  Lower the stress in your life. Stress can make you want to smoke. Try these things to help your stress: ? Getting regular exercise. ? Doing deep-breathing exercises. ? Doing yoga. ? Meditating. ? Doing a body scan. To do this, close your eyes, focus on one area of your body at a time from head to toe. Notice which parts of your body are tense. Try to relax the muscles in those areas. How will I feel when I quit smoking? Day 1 to 3 weeks Within the first 24 hours,  you may start to have some problems that come from quitting tobacco. These problems are very bad 2-3 days after you quit, but they do not often last for more than 2-3 weeks. You may get these symptoms:  Mood swings.  Feeling restless, nervous, angry, or annoyed.  Trouble concentrating.  Dizziness.  Strong desire for high-sugar foods and nicotine.  Weight gain.  Trouble pooping (constipation).  Feeling like you may vomit (nausea).  Coughing or a sore throat.  Changes in how the medicines that you take for other issues work in your body.  Depression.  Trouble sleeping (insomnia). Week 3 and afterward After the first 2-3 weeks of quitting, you may start to notice more positive results, such as:  Better sense of smell and taste.  Less coughing and sore throat.  Slower heart rate.  Lower blood pressure.  Clearer skin.  Better breathing.  Fewer sick days. Quitting smoking can be hard. Do not give up if you fail the first time. Some people need to try a few times before they succeed. Do your best to stick to your quit plan, and talk with your doctor if you have any questions or concerns. Summary  Smoking tobacco is the leading cause of preventable death. Quitting smoking can be hard, but it is one of the best things that you can do for your health.  When you decide to quit smoking, make a plan to help you succeed.  Quit smoking right away, not slowly over a period of time.  When you start quitting, seek help from your doctor, family, or friends. This information is not intended to replace advice given to you by your health care provider. Make sure you discuss any questions you have with your health care provider. Document Released: 05/29/2009 Document Revised: 10/20/2018 Document Reviewed: 10/21/2018 Elsevier Patient Education  2020 [employer]  

## 2019-07-11 ENCOUNTER — Inpatient Hospital Stay: Payer: Medicare Other

## 2019-07-11 ENCOUNTER — Other Ambulatory Visit: Payer: Self-pay

## 2019-07-11 ENCOUNTER — Encounter: Payer: Self-pay | Admitting: Internal Medicine

## 2019-07-11 ENCOUNTER — Inpatient Hospital Stay (HOSPITAL_BASED_OUTPATIENT_CLINIC_OR_DEPARTMENT_OTHER): Payer: Medicare Other | Admitting: Internal Medicine

## 2019-07-11 VITALS — BP 108/65 | HR 100 | Temp 98.0°F | Resp 17 | Ht 65.0 in | Wt 175.7 lb

## 2019-07-11 DIAGNOSIS — Z5112 Encounter for antineoplastic immunotherapy: Secondary | ICD-10-CM | POA: Diagnosis not present

## 2019-07-11 DIAGNOSIS — C342 Malignant neoplasm of middle lobe, bronchus or lung: Secondary | ICD-10-CM | POA: Diagnosis not present

## 2019-07-11 DIAGNOSIS — D500 Iron deficiency anemia secondary to blood loss (chronic): Secondary | ICD-10-CM

## 2019-07-11 DIAGNOSIS — I10 Essential (primary) hypertension: Secondary | ICD-10-CM | POA: Diagnosis not present

## 2019-07-11 LAB — CBC WITH DIFFERENTIAL (CANCER CENTER ONLY)
Abs Immature Granulocytes: 0.03 10*3/uL (ref 0.00–0.07)
Basophils Absolute: 0 10*3/uL (ref 0.0–0.1)
Basophils Relative: 0 %
Eosinophils Absolute: 0.1 10*3/uL (ref 0.0–0.5)
Eosinophils Relative: 2 %
HCT: 32 % — ABNORMAL LOW (ref 36.0–46.0)
Hemoglobin: 10.1 g/dL — ABNORMAL LOW (ref 12.0–15.0)
Immature Granulocytes: 1 %
Lymphocytes Relative: 10 %
Lymphs Abs: 0.4 10*3/uL — ABNORMAL LOW (ref 0.7–4.0)
MCH: 27.4 pg (ref 26.0–34.0)
MCHC: 31.6 g/dL (ref 30.0–36.0)
MCV: 87 fL (ref 80.0–100.0)
Monocytes Absolute: 0.3 10*3/uL (ref 0.1–1.0)
Monocytes Relative: 7 %
Neutro Abs: 3 10*3/uL (ref 1.7–7.7)
Neutrophils Relative %: 80 %
Platelet Count: 192 10*3/uL (ref 150–400)
RBC: 3.68 MIL/uL — ABNORMAL LOW (ref 3.87–5.11)
RDW: 14.4 % (ref 11.5–15.5)
WBC Count: 3.7 10*3/uL — ABNORMAL LOW (ref 4.0–10.5)
nRBC: 0 % (ref 0.0–0.2)

## 2019-07-11 LAB — CMP (CANCER CENTER ONLY)
ALT: 21 U/L (ref 0–44)
AST: 23 U/L (ref 15–41)
Albumin: 3.9 g/dL (ref 3.5–5.0)
Alkaline Phosphatase: 95 U/L (ref 38–126)
Anion gap: 10 (ref 5–15)
BUN: 23 mg/dL (ref 8–23)
CO2: 19 mmol/L — ABNORMAL LOW (ref 22–32)
Calcium: 9.2 mg/dL (ref 8.9–10.3)
Chloride: 105 mmol/L (ref 98–111)
Creatinine: 1.4 mg/dL — ABNORMAL HIGH (ref 0.44–1.00)
GFR, Est AFR Am: 43 mL/min — ABNORMAL LOW (ref >60)
GFR, Estimated: 37 mL/min — ABNORMAL LOW (ref >60)
Glucose, Bld: 283 mg/dL — ABNORMAL HIGH (ref 70–99)
Potassium: 4.7 mmol/L (ref 3.5–5.1)
Sodium: 134 mmol/L — ABNORMAL LOW (ref 135–145)
Total Bilirubin: <0.2 mg/dL — ABNORMAL LOW (ref 0.3–1.2)
Total Protein: 8.1 g/dL (ref 6.5–8.1)

## 2019-07-11 MED ORDER — SODIUM CHLORIDE 0.9 % IV SOLN
10.8000 mg/kg | Freq: Once | INTRAVENOUS | Status: AC
  Administered 2019-07-11: 740 mg via INTRAVENOUS
  Filled 2019-07-11: qty 10

## 2019-07-11 MED ORDER — SODIUM CHLORIDE 0.9 % IV SOLN
Freq: Once | INTRAVENOUS | Status: AC
  Administered 2019-07-11: 13:00:00 via INTRAVENOUS
  Filled 2019-07-11: qty 250

## 2019-07-11 NOTE — Patient Instructions (Signed)
[name] Discharge Instructions for Patients Receiving Chemotherapy  Today you received the following chemotherapy agents:  Durvalumab  To help prevent nausea and vomiting after your treatment, we encourage you to take your nausea medication as prescribed.   If you develop nausea and vomiting that is not controlled by your nausea medication, call the clinic.   BELOW ARE SYMPTOMS THAT SHOULD BE REPORTED IMMEDIATELY:  *FEVER GREATER THAN 100.5 F  *CHILLS WITH OR WITHOUT FEVER  NAUSEA AND VOMITING THAT IS NOT CONTROLLED WITH YOUR NAUSEA MEDICATION  *UNUSUAL SHORTNESS OF BREATH  *UNUSUAL BRUISING OR BLEEDING  TENDERNESS IN MOUTH AND THROAT WITH OR WITHOUT PRESENCE OF ULCERS  *URINARY PROBLEMS  *BOWEL PROBLEMS  UNUSUAL RASH Items with * indicate a potential emergency and should be followed up as soon as possible.  Feel free to call the clinic should you have any questions or concerns. The clinic phone number is [phone].  Please show the [facility] at check-in to the Emergency Department and triage nurse.

## 2019-07-11 NOTE — Progress Notes (Signed)
[facility] [phone]   [phone]  OFFICE PROGRESS NOTE  [name], MD [place] [address]  DIAGNOSIS: Stage IIIB (T3,N3, M0)non-small cell lung cancer, squamous cell carcinoma presented with large right middle lobe lung breast in addition to mediastinal and bilateral hilar lymphadenopathy and suspicious right supraclavicular lymph node diagnosed in August 2019.   PRIOR THERAPY: A course of concurrent chemoradiation with weekly carboplatin for AUC of 2 and paclitaxel 45 MG/M2.  First dose of chemotherapy given on 05/29/2018.  Status post 5 cycles.  CURRENT THERAPY:  Consolidation treatment with immunotherapy with Imfinzi 10 mg/KG every 2 weeks.  First dose August 10, 2018.  Status post 24 cycles.  INTERVAL HISTORY: Jenna [name] 73 y.o. female returns to the clinic today for follow-up visit.  The patient is feeling fine today with no concerning complaints.  She has been tolerating her treatment with immunotherapy fairly well.  She denied having any chest pain, shortness of breath, cough or hemoptysis.  She denied having any fever or chills.  She has no nausea, vomiting, diarrhea or constipation.  She has no headache or visual changes.  She is here today for evaluation before starting cycle #25 of her treatment.[name]  MEDICAL HISTORY: Past Medical History:  Diagnosis Date  . Blood transfusion without reported diagnosis   . Cataract   . DM (diabetes mellitus) ([place])   . GERD (gastroesophageal reflux disease)   . Hyperlipidemia   . Hypertension   . Iron deficiency anemia   . NSCL ca dx'd 03/2018  . Thyroid disease     ALLERGIES:  is allergic to paclitaxel; aspirin; esomeprazole magnesium; and ace inhibitors.  MEDICATIONS:  Current Outpatient Medications  Medication Sig Dispense Refill  . blood glucose meter kit and supplies KIT Use up to four times daily as directed 1 each 0  . canagliflozin (INVOKANA)  100 MG TABS tablet Take 1 tablet (100 mg total) by mouth daily before breakfast. 30 tablet 3  . dexlansoprazole (DEXILANT) 60 MG capsule Take 1qam ac 90 capsule 3  . diphenhydrAMINE (BENADRYL) 2 % cream Apply 1 application topically as needed for itching.    . docusate sodium (COLACE) 100 MG capsule Take 100 mg by mouth daily as needed for mild constipation.    [name] doxycycline (VIBRA-TABS) 100 MG tablet Take 1 tablet (100 mg total) by mouth 2 (two) times daily. (Patient not taking: Reported on 06/27/2019) 14 tablet 0  . Ferrous Sulfate (IRON) 325 (65 Fe) MG TABS TAKE 1 TABLET BY MOUTH TWICE DAILY WITH A MEAL 180 tablet 0  . glipiZIDE (GLUCOTROL) 10 MG tablet TAKE 1 TABLET BY MOUTH TWICE DAILY BEFORE A MEAL 180 tablet 0  . labetalol (NORMODYNE) 100 MG tablet Take 0.5 tablets (50 mg total) by mouth 2 (two) times daily. 90 tablet 0  . levothyroxine (EUTHYROX) 75 MCG tablet TAKE 1 TABLET BY MOUTH ONCE DAILY BEFORE BREAKFAST 30 tablet 2  . losartan (COZAAR) 25 MG tablet Take 1 tablet (25 mg total) by mouth daily. 30 tablet 3  . Multiple Vitamin (MULTIVITAMIN WITH MINERALS) TABS tablet Take 1 tablet by mouth at bedtime.     . mupirocin ointment (BACTROBAN) 2 % Apply into wound twice daily. 22 g 0  . simvastatin (ZOCOR) 10 MG tablet Take 1 tablet (10 mg total) by mouth at bedtime. 90 tablet 3   No current facility-administered medications for this visit.    Facility-Administered Medications Ordered in Other  Visits  Medication Dose Route Frequency Provider Last Rate Last Dose  . promethazine (PHENERGAN) injection 12.5 mg  12.5 mg Intravenous Once Harle Stanford., PA-C      . Tbo-Filgrastim (GRANIX) injection 300 mcg  300 mcg Subcutaneous Once Curt Bears, MD        SURGICAL HISTORY:  Past Surgical History:  Procedure Laterality Date  . CATARACT EXTRACTION Right   . CHOLECYSTECTOMY    . COLONOSCOPY  10/24/2009   normal rectum/1X1cm abnormal lesion in the ascending colon (bx benign). TI normal for  10cm.  Prep difficult/inadequate. f/u TCS 09/2012 recommended  . COLONOSCOPY  10/13/2004   Normal rectum/Diminutive polyps, splenic flexure, cold biopsied/removed.  Remainder of colonic mucosa appeared normal.  . COLONOSCOPY N/A 12/14/2012   JQZ:[id number] polyp-tubular adenoma  . ESOPHAGOGASTRODUODENOSCOPY  10/13/2004    Normal esophagus/ Nodular volcano like lesion in the antrum, either representing a  pancreatic rest or leiomyoma, biopsied.  Remainder of the gastric mucosa appeared normal, normal D1-D2  . ESOPHAGOGASTRODUODENOSCOPY  10/24/2009   Benign biopsies. normal esophagus/small hiatal hernia/nodular lesion antrum/distal greater curvature. duodenal AVM s/p ablation  . GIVENS CAPSULE STUDY  07/27/2010    multiple arteriovenous malformations which could definitely be the contributor to her drifting hemoglobin and hematocrit  . TUBAL LIGATION    . VIDEO BRONCHOSCOPY WITH ENDOBRONCHIAL ULTRASOUND N/A 04/27/2018   Procedure: VIDEO BRONCHOSCOPY WITH ENDOBRONCHIAL ULTRASOUND;  Surgeon: Melrose Nakayama, MD;  Location: [facility] OR;  Service: Thoracic;  Laterality: N/A;    REVIEW OF SYSTEMS:  A comprehensive review of systems was negative.   PHYSICAL EXAMINATION: General appearance: alert, cooperative and no distress Head: Normocephalic, without obvious abnormality, atraumatic Neck: no adenopathy, no JVD, supple, symmetrical, trachea midline and thyroid not enlarged, symmetric, no tenderness/mass/nodules Lymph nodes: Cervical, supraclavicular, and axillary nodes normal. Resp: clear to auscultation bilaterally Back: symmetric, no curvature. ROM normal. No CVA tenderness. Cardio: regular rate and rhythm, S1, S2 normal, no murmur, click, rub or gallop GI: soft, non-tender; bowel sounds normal; no masses,  no organomegaly Extremities: extremities normal, atraumatic, no cyanosis or edema  ECOG PERFORMANCE STATUS: 1 - Symptomatic but completely ambulatory  Blood pressure 108/65, pulse 100, temperature  98 F (36.7 C), temperature source Oral, resp. rate 17, height '5\' 5"'$  (1.651 m), weight 175 lb 11.2 oz (79.7 kg), SpO2 100 %.  LABORATORY DATA: Lab Results  Component Value Date   WBC 4.2 06/27/2019   HGB 10.7 (L) 06/27/2019   HCT 34.4 (L) 06/27/2019   MCV 88.7 06/27/2019   PLT 221 06/27/2019      Chemistry      Component Value Date/Time   NA 136 06/27/2019 1007   K 4.5 06/27/2019 1007   CL 104 06/27/2019 1007   CO2 21 (L) 06/27/2019 1007   BUN 28 (H) 06/27/2019 1007   BUN 12 12/10/2011 0919   CREATININE 1.57 (H) 06/27/2019 1007   CREATININE 0.72 12/10/2011 0919      Component Value Date/Time   CALCIUM 9.5 06/27/2019 1007   ALKPHOS 95 06/27/2019 1007   ALKPHOS 88 12/10/2011 0919   AST 17 06/27/2019 1007   ALT 13 06/27/2019 1007   BILITOT 0.3 06/27/2019 1007       RADIOGRAPHIC STUDIES: No results found.  ASSESSMENT AND PLAN: This is a very pleasant 73 years old African-American female recently diagnosed with a stage IIIB non-small cell lung cancer, squamous cell carcinoma.  She completed a course of concurrent chemoradiation with weekly carboplatin and paclitaxel status post 5 cycles  with partial response.  She tolerated this treatment well except for the pancytopenia and fatigue.  The patient is currently undergoing treatment with consolidation immunotherapy with Imfinzi status post 24 cycles. The patient has been tolerating her treatment well with no concerning adverse effects. I recommended for her to proceed with cycle #25 today as planned. I will see her back for follow-up visit in 2 weeks for evaluation before the last cycle of her consolidation immunotherapy. The patient was advised to call immediately if she has any concerning symptoms in the interval. The patient voices understanding of current disease status and treatment options and is in agreement with the current care plan. All questions were answered. The patient knows to call the clinic with any problems,  questions or concerns. We can certainly see the patient much sooner if necessary.  Disclaimer: This note was dictated with voice recognition software. Similar sounding words can inadvertently be transcribed and may not be corrected upon review.

## 2019-07-25 NOTE — Progress Notes (Signed)
[facility] OFFICE PROGRESS NOTE  Jenna Passy, MD [place] [address]  DIAGNOSIS: Stage IIIB (T3,N3, M0)non-small cell lung cancer, squamous cell carcinoma presented with large right middle lobe lungmassin addition to mediastinal and bilateral hilar lymphadenopathy and suspicious right supraclavicular lymph node diagnosed in August 2019.  PRIOR THERAPY: Acourse of concurrent chemoradiation with weekly carboplatin for AUC of 2 and paclitaxel 45 MG/M2.First dose of chemotherapy given on 05/29/2018. Status post 5 cycles.  CURRENT THERAPY: Consolidation treatment with immunotherapy with Imfinzi 10 mg/KG every 2 weeks. First dose August 10, 2018. Status post25cycles.  INTERVAL HISTORY: Jenna [name] 72 y.o. female returns to the clinic for a follow up visit. The patient is feeling well today without any concerning complaints. The patient continues to tolerate treatment with immunotherapy with imfinzi well without any adverse effects. Denies any fever, chills, night sweats, or weight loss. Denies any chest pain, shortness of breath, cough, or hemoptysis. Denies any nausea, vomiting, diarrhea, or constipation. Denies any headache or visual changes. Denies any rashes or skin changes. The patient is here today for evaluation prior to starting her final cycle of immunotherapy with cycle # 26   MEDICAL HISTORY: Past Medical History:  Diagnosis Date  . Blood transfusion without reported diagnosis   . Cataract   . DM (diabetes mellitus) ([place])   . GERD (gastroesophageal reflux disease)   . Hyperlipidemia   . Hypertension   . Iron deficiency anemia   . NSCL ca dx'd 03/2018  . Thyroid disease     ALLERGIES:  is allergic to paclitaxel; aspirin; esomeprazole magnesium; and ace inhibitors.  MEDICATIONS:  Current Outpatient Medications  Medication Sig Dispense Refill  . blood glucose meter kit and supplies KIT Use up to four times daily as  directed 1 each 0  . canagliflozin (INVOKANA) 100 MG TABS tablet Take 1 tablet (100 mg total) by mouth daily before breakfast. 30 tablet 3  . dexlansoprazole (DEXILANT) 60 MG capsule Take 1qam ac 90 capsule 3  . diphenhydrAMINE (BENADRYL) 2 % cream Apply 1 application topically as needed for itching.    . docusate sodium (COLACE) 100 MG capsule Take 100 mg by mouth daily as needed for mild constipation.    . Ferrous Sulfate (IRON) 325 (65 Fe) MG TABS TAKE 1 TABLET BY MOUTH TWICE DAILY WITH A MEAL 180 tablet 0  . glipiZIDE (GLUCOTROL) 10 MG tablet TAKE 1 TABLET BY MOUTH TWICE DAILY BEFORE A MEAL 180 tablet 0  . labetalol (NORMODYNE) 100 MG tablet Take 0.5 tablets (50 mg total) by mouth 2 (two) times daily. 90 tablet 0  . levothyroxine (EUTHYROX) 75 MCG tablet TAKE 1 TABLET BY MOUTH ONCE DAILY BEFORE BREAKFAST 30 tablet 2  . losartan (COZAAR) 25 MG tablet Take 1 tablet (25 mg total) by mouth daily. 30 tablet 3  . Multiple Vitamin (MULTIVITAMIN WITH MINERALS) TABS tablet Take 1 tablet by mouth at bedtime.     . mupirocin ointment (BACTROBAN) 2 % Apply into wound twice daily. 22 g 0  . simvastatin (ZOCOR) 10 MG tablet Take 1 tablet (10 mg total) by mouth at bedtime. 90 tablet 3   No current facility-administered medications for this visit.   Facility-Administered Medications Ordered in Other Visits  Medication Dose Route Frequency Provider Last Rate Last Admin  . promethazine (PHENERGAN) injection 12.5 mg  12.5 mg Intravenous Once Harle Stanford., PA-C      . Tbo-Filgrastim (GRANIX) injection 300 mcg  300 mcg Subcutaneous  Once Curt Bears, MD        SURGICAL HISTORY:  Past Surgical History:  Procedure Laterality Date  . CATARACT EXTRACTION Right   . CHOLECYSTECTOMY    . COLONOSCOPY  10/24/2009   normal rectum/1X1cm abnormal lesion in the ascending colon (bx benign). TI normal for 10cm.  Prep difficult/inadequate. f/u TCS 09/2012 recommended  . COLONOSCOPY  10/13/2004   Normal  rectum/Diminutive polyps, splenic flexure, cold biopsied/removed.  Remainder of colonic mucosa appeared normal.  . COLONOSCOPY N/A 12/14/2012   DZH:GDJMEQA polyp-tubular adenoma  . ESOPHAGOGASTRODUODENOSCOPY  10/13/2004    Normal esophagus/ Nodular volcano like lesion in the antrum, either representing a  pancreatic rest or leiomyoma, biopsied.  Remainder of the gastric mucosa appeared normal, normal D1-D2  . ESOPHAGOGASTRODUODENOSCOPY  10/24/2009   Benign biopsies. normal esophagus/small hiatal hernia/nodular lesion antrum/distal greater curvature. duodenal AVM s/p ablation  . GIVENS CAPSULE STUDY  07/27/2010    multiple arteriovenous malformations which could definitely be the contributor to her drifting hemoglobin and hematocrit  . TUBAL LIGATION    . VIDEO BRONCHOSCOPY WITH ENDOBRONCHIAL ULTRASOUND N/A 04/27/2018   Procedure: VIDEO BRONCHOSCOPY WITH ENDOBRONCHIAL ULTRASOUND;  Surgeon: Melrose Nakayama, MD;  Location: [facility] OR;  Service: Thoracic;  Laterality: N/A;    REVIEW OF SYSTEMS:   Review of Systems  Constitutional: Negative for appetite change, chills, fatigue, fever and unexpected weight change.  HENT: Negative for mouth sores, nosebleeds, sore throat and trouble swallowing.   Eyes: Negative for eye problems and icterus.  Respiratory: Negative for cough, hemoptysis, shortness of breath and wheezing.   Cardiovascular: Negative for chest pain and leg swelling.  Gastrointestinal: Negative for abdominal pain, constipation, diarrhea, nausea and vomiting.  Genitourinary: Negative for bladder incontinence, difficulty urinating, dysuria, frequency and hematuria.   Musculoskeletal: Negative for back pain, gait problem, neck pain and neck stiffness.  Skin: Negative for itching and rash.  Neurological: Negative for dizziness, extremity weakness, gait problem, headaches, light-headedness and seizures.  Hematological: Negative for adenopathy. Does not bruise/bleed easily.   Psychiatric/Behavioral: Negative for confusion, depression and sleep disturbance. The patient is not nervous/anxious.     PHYSICAL EXAMINATION:  Blood pressure (!) 136/46, pulse 98, temperature 98.2 F (36.8 C), temperature source Temporal, resp. rate 18, height '5\' 5"'$  (1.651 m), weight 174 lb 3.2 oz (79 kg), SpO2 99 %.  ECOG PERFORMANCE STATUS: 1 - Symptomatic but completely ambulatory  Physical Exam  Constitutional: Oriented to person, place, and time and well-developed, well-nourished, and in no distress.  HENT:  Head: Normocephalic and atraumatic.  Mouth/Throat: Oropharynx is clear and moist. No oropharyngeal exudate.  Eyes: Conjunctivae are normal. Right eye exhibits no discharge. Left eye exhibits no discharge. No scleral icterus.  Neck: Normal range of motion. Neck supple.  Cardiovascular: Normal rate, regular rhythm, normal heart sounds and intact distal pulses.   Pulmonary/Chest: Effort normal and breath sounds normal. No respiratory distress. No wheezes. No rales.  Abdominal: Soft. Bowel sounds are normal. Exhibits no distension and no mass. There is no tenderness.  Musculoskeletal: Normal range of motion. Exhibits no edema.  Lymphadenopathy:    No cervical adenopathy.  Neurological: Alert and oriented to person, place, and time. Exhibits normal muscle tone. Gait normal. Coordination normal.  Skin: Skin is warm and dry. No rash noted. Not diaphoretic. No erythema. No pallor.  Psychiatric: Mood, memory and judgment normal.  Vitals reviewed.  LABORATORY DATA: Lab Results  Component Value Date   WBC 4.1 07/26/2019   HGB 10.5 (L) 07/26/2019   HCT  33.9 (L) 07/26/2019   MCV 89.4 07/26/2019   PLT 200 07/26/2019      Chemistry      Component Value Date/Time   NA 137 07/26/2019 1003   K 4.6 07/26/2019 1003   CL 109 07/26/2019 1003   CO2 19 (L) 07/26/2019 1003   BUN 26 (H) 07/26/2019 1003   BUN 12 12/10/2011 0919   CREATININE 1.33 (H) 07/26/2019 1003   CREATININE 0.72  12/10/2011 0919      Component Value Date/Time   CALCIUM 9.0 07/26/2019 1003   ALKPHOS 87 07/26/2019 1003   ALKPHOS 88 12/10/2011 0919   AST 18 07/26/2019 1003   ALT 13 07/26/2019 1003   BILITOT <0.2 (L) 07/26/2019 1003       RADIOGRAPHIC STUDIES:  No results found.   ASSESSMENT/PLAN:  This is a very pleasant 73 year old African-American female diagnosed with stageIIIbnon-small cell lung cancer, squamous cell carcinoma. She presented with a large right middle lobe lung mass in addition to mediastinal and bilateral hilar lymphadenopathy. She also hassuspicious right supraclavicular lymphadenopathy. She was diagnosed in August 2019.  She previously underwent concurrent chemoradiation with carboplatin and paclitaxel. She is status post 5 cycles with a partial response. She tolerated treatment fairlywell except for pancytopenia and fatigue.  The patient is currently undergoing consolidation immunotherapy with Imfinzi 10 mg/kg IV every 2 weeks. She is status post 25cycles. She has been tolerating it well without any adverse effects. I recommend that she proceed with her final cycle #26today as scheduled.  I will arrange for the patient to have a restaging CT scan performed in 2- 3 weeks. We will see her back a few days later for evaluation and to review her scan results.  The patient was advised to call immediately if she has any concerning symptoms in the interval. The patient voices understanding of current disease status and treatment options and is in agreement with the current care plan. All questions were answered. The patient knows to call the clinic with any problems, questions or concerns. We can certainly see the patient much sooner if necessary  Orders Placed This Encounter  Procedures  . CT Chest W Contrast    Standing Status:   Future    Standing Expiration Date:   07/25/2020    Order Specific Question:   ** REASON FOR EXAM (FREE TEXT)    Answer:    restaging Lung cancer    Order Specific Question:   If indicated for the ordered procedure, I authorize the administration of contrast media per Radiology protocol    Answer:   Yes    Order Specific Question:   Preferred imaging location?    Answer:   [facility]    Order Specific Question:   Radiology Contrast Protocol - do NOT remove file path    Answer:   [email or web address]  . CBC with Differential ([facility] Only)    Standing Status:   Future    Standing Expiration Date:   07/25/2020  . CMP ([facility] only)    Standing Status:   Future    Standing Expiration Date:   07/25/2020     Cassandra L Heilingoetter, PA-C 07/26/19

## 2019-07-26 ENCOUNTER — Inpatient Hospital Stay: Payer: Medicare Other | Attending: Physician Assistant | Admitting: Physician Assistant

## 2019-07-26 ENCOUNTER — Other Ambulatory Visit: Payer: Self-pay

## 2019-07-26 ENCOUNTER — Inpatient Hospital Stay: Payer: Medicare Other

## 2019-07-26 VITALS — BP 136/46 | HR 98 | Temp 98.2°F | Resp 18 | Ht 65.0 in | Wt 174.2 lb

## 2019-07-26 DIAGNOSIS — D61818 Other pancytopenia: Secondary | ICD-10-CM | POA: Diagnosis not present

## 2019-07-26 DIAGNOSIS — Z5112 Encounter for antineoplastic immunotherapy: Secondary | ICD-10-CM | POA: Diagnosis not present

## 2019-07-26 DIAGNOSIS — C342 Malignant neoplasm of middle lobe, bronchus or lung: Secondary | ICD-10-CM

## 2019-07-26 DIAGNOSIS — E119 Type 2 diabetes mellitus without complications: Secondary | ICD-10-CM | POA: Insufficient documentation

## 2019-07-26 DIAGNOSIS — E785 Hyperlipidemia, unspecified: Secondary | ICD-10-CM | POA: Insufficient documentation

## 2019-07-26 DIAGNOSIS — Z7984 Long term (current) use of oral hypoglycemic drugs: Secondary | ICD-10-CM | POA: Insufficient documentation

## 2019-07-26 DIAGNOSIS — Z79899 Other long term (current) drug therapy: Secondary | ICD-10-CM | POA: Insufficient documentation

## 2019-07-26 DIAGNOSIS — E079 Disorder of thyroid, unspecified: Secondary | ICD-10-CM | POA: Diagnosis not present

## 2019-07-26 DIAGNOSIS — R5382 Chronic fatigue, unspecified: Secondary | ICD-10-CM

## 2019-07-26 DIAGNOSIS — I10 Essential (primary) hypertension: Secondary | ICD-10-CM | POA: Diagnosis not present

## 2019-07-26 LAB — CMP (CANCER CENTER ONLY)
ALT: 13 U/L (ref 0–44)
AST: 18 U/L (ref 15–41)
Albumin: 3.8 g/dL (ref 3.5–5.0)
Alkaline Phosphatase: 87 U/L (ref 38–126)
Anion gap: 9 (ref 5–15)
BUN: 26 mg/dL — ABNORMAL HIGH (ref 8–23)
CO2: 19 mmol/L — ABNORMAL LOW (ref 22–32)
Calcium: 9 mg/dL (ref 8.9–10.3)
Chloride: 109 mmol/L (ref 98–111)
Creatinine: 1.33 mg/dL — ABNORMAL HIGH (ref 0.44–1.00)
GFR, Est AFR Am: 46 mL/min — ABNORMAL LOW (ref >60)
GFR, Estimated: 40 mL/min — ABNORMAL LOW (ref >60)
Glucose, Bld: 258 mg/dL — ABNORMAL HIGH (ref 70–99)
Potassium: 4.6 mmol/L (ref 3.5–5.1)
Sodium: 137 mmol/L (ref 135–145)
Total Bilirubin: <0.2 mg/dL — ABNORMAL LOW (ref 0.3–1.2)
Total Protein: 8.1 g/dL (ref 6.5–8.1)

## 2019-07-26 LAB — CBC WITH DIFFERENTIAL (CANCER CENTER ONLY)
Abs Immature Granulocytes: 0.03 10*3/uL (ref 0.00–0.07)
Basophils Absolute: 0 10*3/uL (ref 0.0–0.1)
Basophils Relative: 1 %
Eosinophils Absolute: 0.1 10*3/uL (ref 0.0–0.5)
Eosinophils Relative: 1 %
HCT: 33.9 % — ABNORMAL LOW (ref 36.0–46.0)
Hemoglobin: 10.5 g/dL — ABNORMAL LOW (ref 12.0–15.0)
Immature Granulocytes: 1 %
Lymphocytes Relative: 8 %
Lymphs Abs: 0.3 10*3/uL — ABNORMAL LOW (ref 0.7–4.0)
MCH: 27.7 pg (ref 26.0–34.0)
MCHC: 31 g/dL (ref 30.0–36.0)
MCV: 89.4 fL (ref 80.0–100.0)
Monocytes Absolute: 0.2 10*3/uL (ref 0.1–1.0)
Monocytes Relative: 6 %
Neutro Abs: 3.4 10*3/uL (ref 1.7–7.7)
Neutrophils Relative %: 83 %
Platelet Count: 200 10*3/uL (ref 150–400)
RBC: 3.79 MIL/uL — ABNORMAL LOW (ref 3.87–5.11)
RDW: 14.4 % (ref 11.5–15.5)
WBC Count: 4.1 10*3/uL (ref 4.0–10.5)
nRBC: 0 % (ref 0.0–0.2)

## 2019-07-26 LAB — TSH: TSH: 4.155 u[IU]/mL — ABNORMAL HIGH (ref 0.308–3.960)

## 2019-07-26 MED ORDER — SODIUM CHLORIDE 0.9 % IV SOLN
10.8000 mg/kg | Freq: Once | INTRAVENOUS | Status: AC
  Administered 2019-07-26: 740 mg via INTRAVENOUS
  Filled 2019-07-26: qty 4.8

## 2019-07-26 MED ORDER — SODIUM CHLORIDE 0.9 % IV SOLN
Freq: Once | INTRAVENOUS | Status: AC
  Administered 2019-07-26: 12:00:00 via INTRAVENOUS
  Filled 2019-07-26: qty 250

## 2019-07-26 NOTE — Progress Notes (Deleted)
Actual dose of Imfinzi 10 mg/kg. Dose entered at 10.8 mg/kg for vial rounding.  Larene Beach, PharmD

## 2019-07-26 NOTE — Progress Notes (Unsigned)
Actual dose of Imfinzi 10 mg/kg. Dose entered at 10.8 mg/kg for vial rounding.  Larene Beach, PharmD

## 2019-07-26 NOTE — Patient Instructions (Signed)
[facility] Discharge Instructions for Patients Receiving Chemotherapy  Today you received the following chemotherapy agents: Imfinzi.  To help prevent nausea and vomiting after your treatment, we encourage you to take your nausea medication as directed.   If you develop nausea and vomiting that is not controlled by your nausea medication, call the clinic.   BELOW ARE SYMPTOMS THAT SHOULD BE REPORTED IMMEDIATELY:  *FEVER GREATER THAN 100.5 F  *CHILLS WITH OR WITHOUT FEVER  NAUSEA AND VOMITING THAT IS NOT CONTROLLED WITH YOUR NAUSEA MEDICATION  *UNUSUAL SHORTNESS OF BREATH  *UNUSUAL BRUISING OR BLEEDING  TENDERNESS IN MOUTH AND THROAT WITH OR WITHOUT PRESENCE OF ULCERS  *URINARY PROBLEMS  *BOWEL PROBLEMS  UNUSUAL RASH Items with * indicate a potential emergency and should be followed up as soon as possible.  Feel free to call the clinic should you have any questions or concerns. The clinic phone number is [phone].  Please show the CHEMO ALERT CARD at check-in to the Emergency Department and triage nurse.   

## 2019-07-27 ENCOUNTER — Telehealth: Payer: Self-pay | Admitting: Internal Medicine

## 2019-07-27 NOTE — Telephone Encounter (Signed)
Scheduled per los. Called and spoke with patient. Confirmed appt 

## 2019-07-30 IMAGING — CT CT CHEST WITH CONTRAST
2 of 4 series · 15 of 36 positions shown, 18 images · IV contrast (OMNIPAQUE)
Comparison: 07/31/2018

CLINICAL DATA: Lung cancer.  Ongoing chemotherapy.

EXAM:
CT CHEST WITH CONTRAST
TECHNIQUE: Multidetector CT imaging of the chest was performed during
intravenous contrast administration.
CONTRAST:  75mL OMNIPAQUE IOHEXOL 300 MG/ML  SOLN

[Series 2: axial st · axial · 0.63mm/px · z∈[-322,-58]mm · 12 of 154 slices shown, 15 images]
[im 11/154  mediastinal]
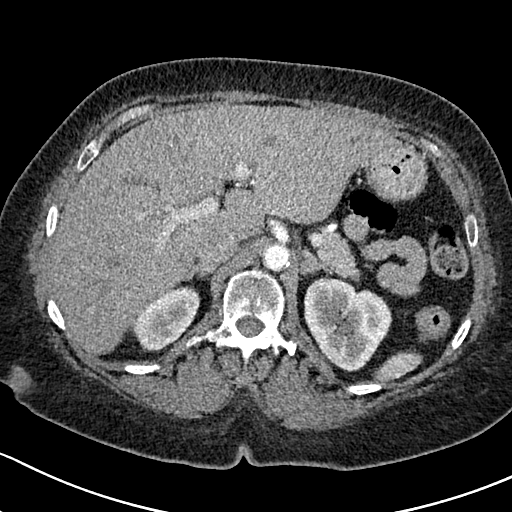
[im 11/154  lung]
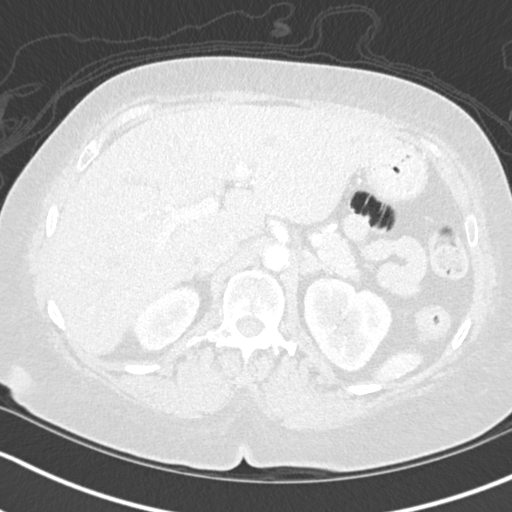
[im 22/154  lung]
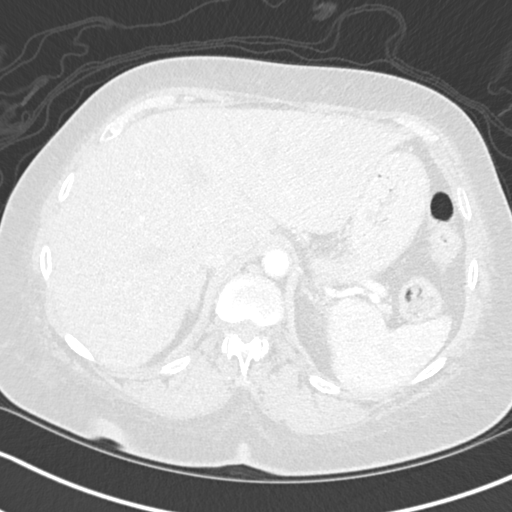
[im 33/154  lung]
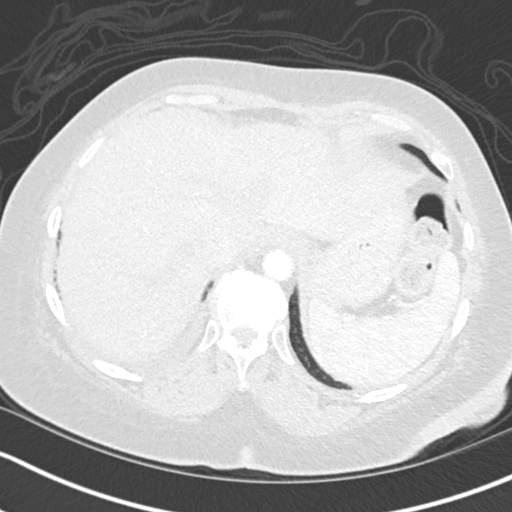
[im 44/154  lung]
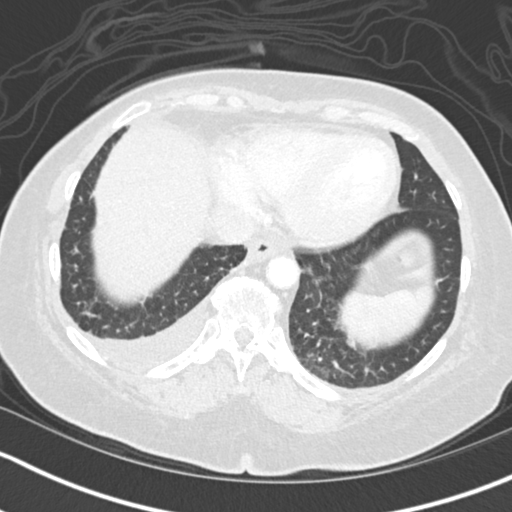
[im 55/154  mediastinal]
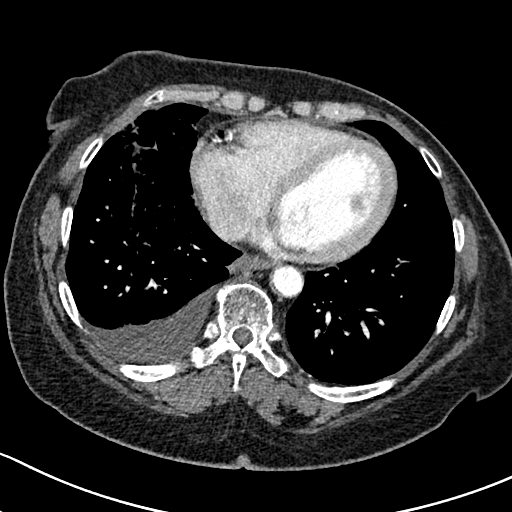
[im 55/154  lung]
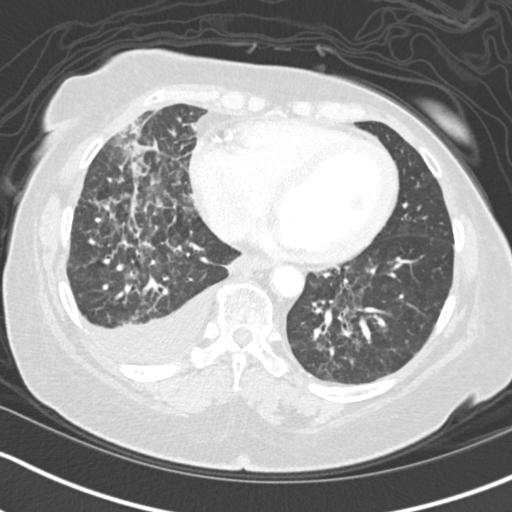
[im 66/154  lung]
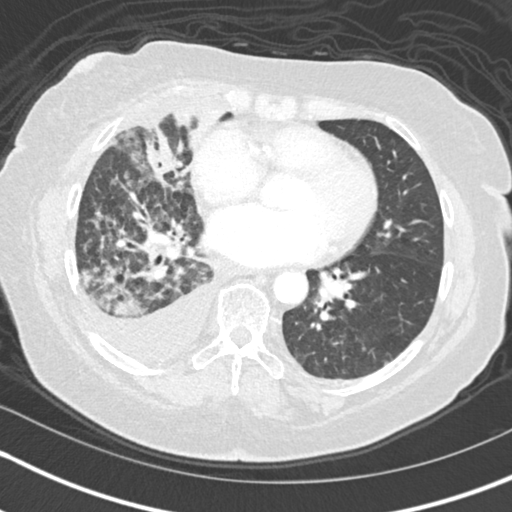
[im 88/154  lung]
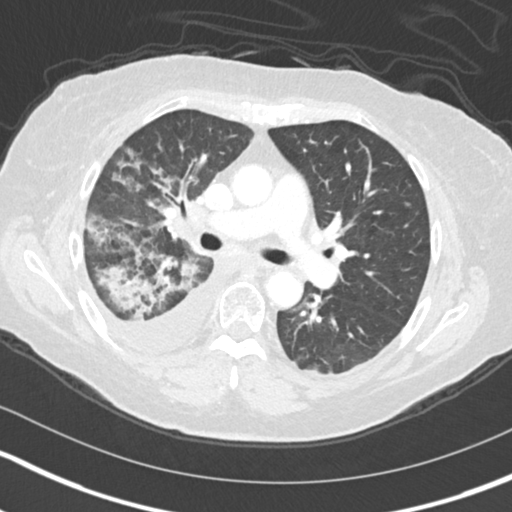
[im 99/154  lung]
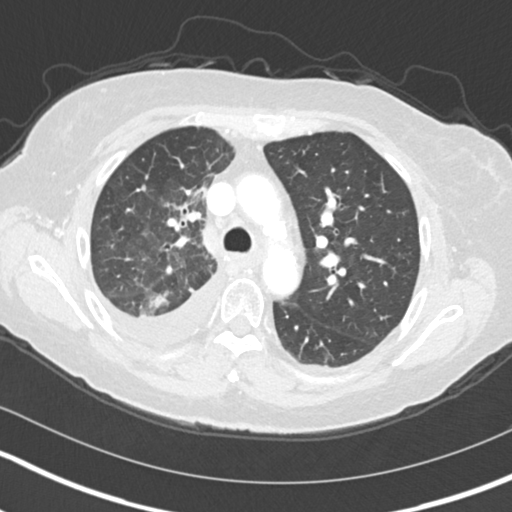
[im 110/154  mediastinal]
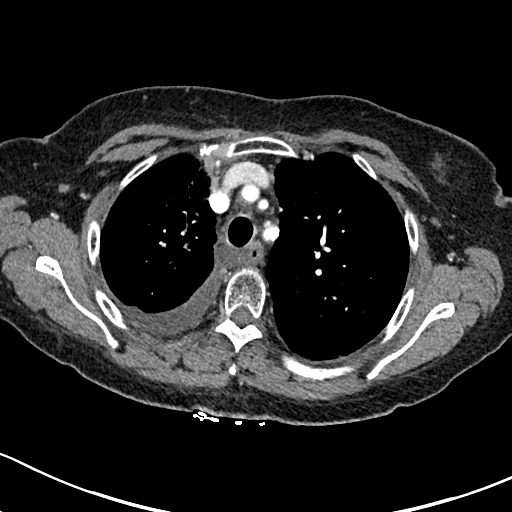
[im 110/154  lung]
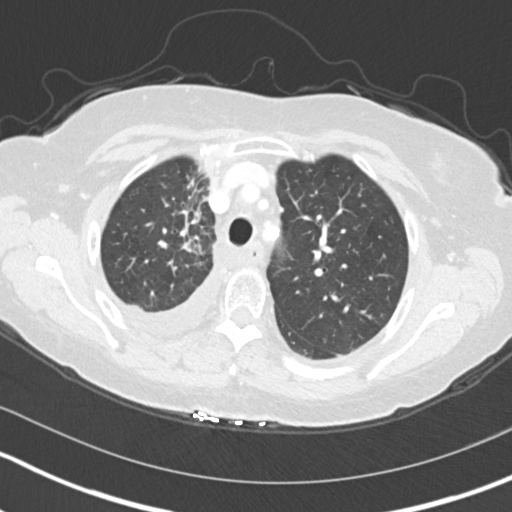
[im 121/154  lung]
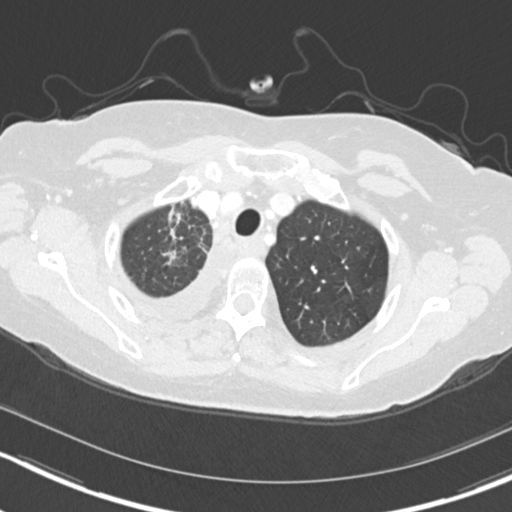
[im 132/154  lung]
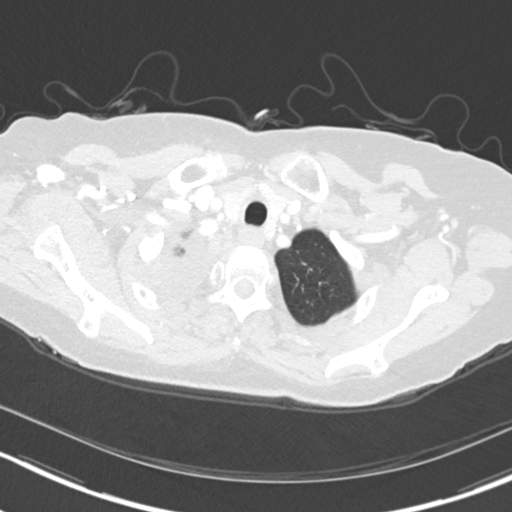
[im 143/154  lung]
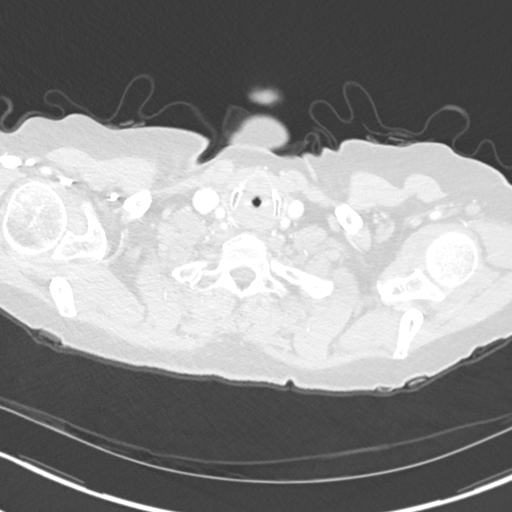

[Series 5: coronal · coronal · 0.60mm/px · 3 of 123 slices shown]
[im 25/123  lung]
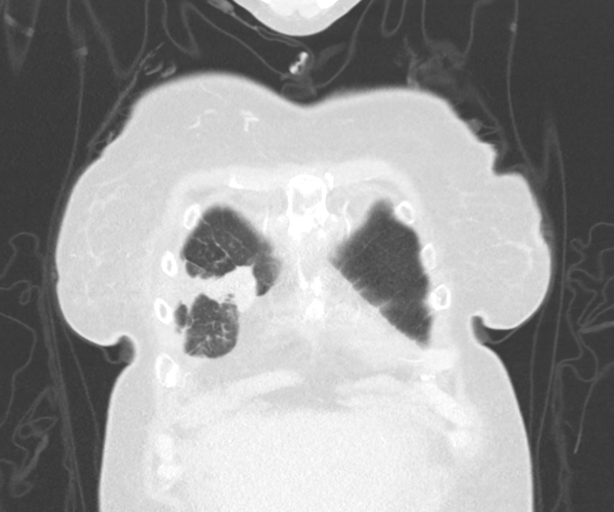
[im 49/123  lung]
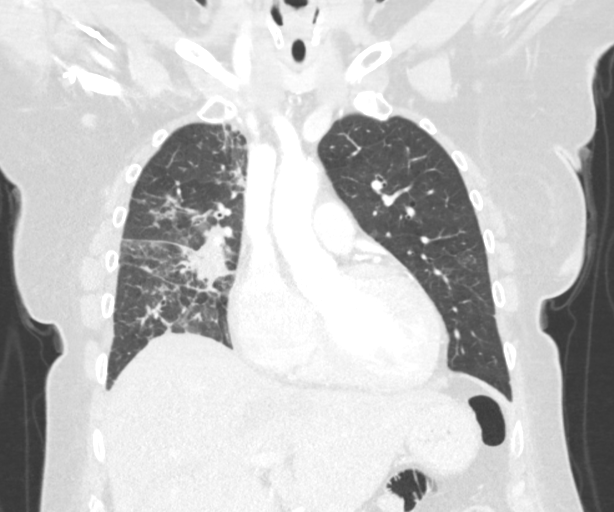
[im 74/123  lung]
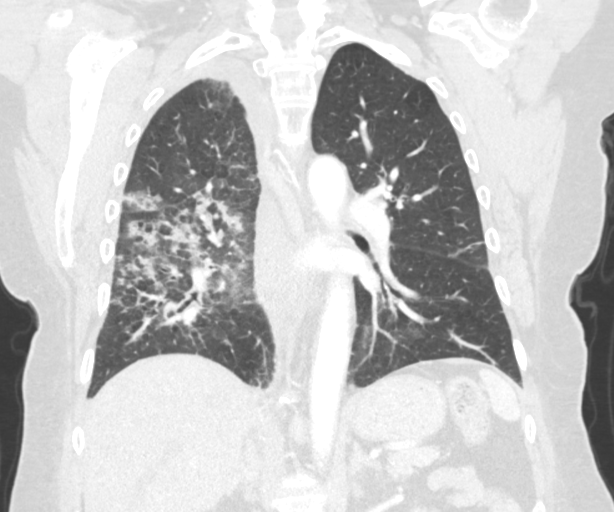

[15 of 36 positions shown; findings below may reference images not displayed]

FINDINGS: Cardiovascular: The heart size is normal. No substantial pericardial
effusion. Coronary artery calcification is evident. Atherosclerotic
calcification is noted in the wall of the thoracic aorta.

Mediastinum/Nodes: 12 mm short axis paraesophageal node ([DATE]) is
slightly increased from 9 mm previously. 8 mm short axis node
towards the right thoracic inlet ([DATE]) was 5 mm previously. Index
node measured in the right supraclavicular region on the previous
study at 8 mm now measures 7 mm. Right hilar lymphadenopathy
measured in long axis on the previous study at 2.2 cm is stable at
2.2 cm today. The esophagus has normal imaging features. There is no
axillary lymphadenopathy.

Lungs/Pleura: The central tracheobronchial airways are patent.
Centrilobular emphsyema noted. Right middle lobe collapse seen on
the previous study has decreased in the interval. There is diffuse
interstitial and patchy airspace disease in the medial right upper
lobe and parahilar right middle and right lower lobes, markedly
progressed in the interval. Patchy ground-glass attenuation in the
central left lower lobe is similar to prior. Several scattered
ill-defined tiny nodular opacities in the left lower lobe are again
noted. The index 4 mm nodule identified on the previous study is
stable. Small to moderate right pleural effusion is new since prior.

Upper Abdomen: Unremarkable.

Musculoskeletal: No worrisome lytic or sclerotic osseous
abnormality.
IMPRESSION: 1. Right middle lobe pulmonary mass measured previously is not
measurable today given the extensive adjacent airspace disease in
the right upper and lower lobes. Right middle lobe collapse seen on
the previous study has decreased in the interval. Worsening
interstitial and airspace disease in the parahilar right lung today
may be sequelae of radiation therapy, but inflammation/infection not
excluded.
2. No substantial change in the right supraclavicular and right
mediastinal/paratracheal lymphadenopathy with some lymph nodes
measuring minimally bigger on today's study and others measuring
minimally smaller. Right hilar disease also stable.
3. Interval redevelopment of small to moderate right pleural
effusion.
4.  Aortic Atherosclerois ([id number]-170.0)

## 2019-08-01 ENCOUNTER — Ambulatory Visit: Payer: Medicare Other

## 2019-08-08 ENCOUNTER — Other Ambulatory Visit: Payer: Self-pay

## 2019-08-08 MED ORDER — CANAGLIFLOZIN 100 MG PO TABS: 100.0000 mg | ORAL_TABLET | Freq: Every day | ORAL | 3 refills | Status: DC

## 2019-08-08 NOTE — Telephone Encounter (Signed)
Last OV 06/11/19 Last fill 06/18/19  #30/3

## 2019-08-13 ENCOUNTER — Inpatient Hospital Stay: Payer: Medicare Other

## 2019-08-13 ENCOUNTER — Other Ambulatory Visit: Payer: Self-pay

## 2019-08-13 ENCOUNTER — Ambulatory Visit (HOSPITAL_COMMUNITY)
Admission: RE | Admit: 2019-08-13 | Discharge: 2019-08-13 | Disposition: A | Discharge status: Home / Self Care | Payer: Medicare Other | Source: Ambulatory Visit | Attending: Physician Assistant | Admitting: Physician Assistant

## 2019-08-13 ENCOUNTER — Other Ambulatory Visit: Payer: Medicare Other

## 2019-08-13 DIAGNOSIS — C342 Malignant neoplasm of middle lobe, bronchus or lung: Secondary | ICD-10-CM | POA: Diagnosis present

## 2019-08-13 DIAGNOSIS — Z5112 Encounter for antineoplastic immunotherapy: Secondary | ICD-10-CM | POA: Diagnosis not present

## 2019-08-13 LAB — CBC WITH DIFFERENTIAL (CANCER CENTER ONLY)
Abs Immature Granulocytes: 0.01 10*3/uL (ref 0.00–0.07)
Basophils Absolute: 0 10*3/uL (ref 0.0–0.1)
Basophils Relative: 1 %
Eosinophils Absolute: 0.1 10*3/uL (ref 0.0–0.5)
Eosinophils Relative: 2 %
HCT: 35.6 % — ABNORMAL LOW (ref 36.0–46.0)
Hemoglobin: 11 g/dL — ABNORMAL LOW (ref 12.0–15.0)
Immature Granulocytes: 0 %
Lymphocytes Relative: 12 %
Lymphs Abs: 0.4 10*3/uL — ABNORMAL LOW (ref 0.7–4.0)
MCH: 27.8 pg (ref 26.0–34.0)
MCHC: 30.9 g/dL (ref 30.0–36.0)
MCV: 90.1 fL (ref 80.0–100.0)
Monocytes Absolute: 0.3 10*3/uL (ref 0.1–1.0)
Monocytes Relative: 10 %
Neutro Abs: 2.6 10*3/uL (ref 1.7–7.7)
Neutrophils Relative %: 75 %
Platelet Count: 204 10*3/uL (ref 150–400)
RBC: 3.95 MIL/uL (ref 3.87–5.11)
RDW: 14.7 % (ref 11.5–15.5)
WBC Count: 3.4 10*3/uL — ABNORMAL LOW (ref 4.0–10.5)
nRBC: 0 % (ref 0.0–0.2)

## 2019-08-13 LAB — CMP (CANCER CENTER ONLY)
ALT: 14 U/L (ref 0–44)
AST: 17 U/L (ref 15–41)
Albumin: 4 g/dL (ref 3.5–5.0)
Alkaline Phosphatase: 87 U/L (ref 38–126)
Anion gap: 10 (ref 5–15)
BUN: 30 mg/dL — ABNORMAL HIGH (ref 8–23)
CO2: 20 mmol/L — ABNORMAL LOW (ref 22–32)
Calcium: 9.4 mg/dL (ref 8.9–10.3)
Chloride: 106 mmol/L (ref 98–111)
Creatinine: 1.29 mg/dL — ABNORMAL HIGH (ref 0.44–1.00)
GFR, Est AFR Am: 48 mL/min — ABNORMAL LOW (ref >60)
GFR, Estimated: 41 mL/min — ABNORMAL LOW (ref >60)
Glucose, Bld: 154 mg/dL — ABNORMAL HIGH (ref 70–99)
Potassium: 4.8 mmol/L (ref 3.5–5.1)
Sodium: 136 mmol/L (ref 135–145)
Total Bilirubin: <0.2 mg/dL — ABNORMAL LOW (ref 0.3–1.2)
Total Protein: 8.3 g/dL — ABNORMAL HIGH (ref 6.5–8.1)

## 2019-08-13 MED ORDER — IOHEXOL 300 MG/ML  SOLN
75.0000 mL | Freq: Once | INTRAMUSCULAR | Status: AC | PRN
  Administered 2019-08-13: 75 mL via INTRAVENOUS

## 2019-08-13 MED ORDER — SODIUM CHLORIDE (PF) 0.9 % IJ SOLN
INTRAMUSCULAR | Status: AC
  Filled 2019-08-13: qty 50

## 2019-08-14 NOTE — Progress Notes (Signed)
[facility] OFFICE PROGRESS NOTE  [name], MD [place] [address]  DIAGNOSIS: Stage IIIB (T3,N3, M0)non-small cell lung cancer, squamous cell carcinoma presented with large right middle lobe lungmassin addition to mediastinal and bilateral hilar lymphadenopathy and suspicious right supraclavicular lymph node diagnosed in August 2019.  PRIOR THERAPY: 1) Acourse of concurrent chemoradiation with weekly carboplatin for AUC of 2 and paclitaxel 45 MG/M2.First dose of chemotherapy given on 05/29/2018. Status post 5 cycles. 2) Consolidation treatment with immunotherapy with Imfinzi 10 mg/KG every 2 weeks. Last dose 07/26/2019 Status post26cycles.  CURRENT THERAPY: Observation  INTERVAL HISTORY: Jenna [name] 73 y.o. female returns to the clinic for a follow up visit. The patient is feeling well today without any concerning complaints. The patient recently completed all 26 cycles of immunotherapy earlier this month. Today, she denies any fever, chills, night sweats, or weight loss. Denies any chest pain, shortness of breath, cough, or hemoptysis. Denies any nausea, vomiting, diarrhea, or constipation. Denies any headache or visual changes. Denies any rashes or skin changes. She recently had a restaging CT scan performed. The patient is here today for evaluation and to review the results of her recently CT scan.    MEDICAL HISTORY: Past Medical History:  Diagnosis Date  . Blood transfusion without reported diagnosis   . Cataract   . DM (diabetes mellitus) ([place])   . GERD (gastroesophageal reflux disease)   . Hyperlipidemia   . Hypertension   . Iron deficiency anemia   . NSCL ca dx'd 03/2018  . Thyroid disease     ALLERGIES:  is allergic to paclitaxel; aspirin; esomeprazole magnesium; and ace inhibitors.  MEDICATIONS:  Current Outpatient Medications  Medication Sig Dispense Refill  . blood glucose meter kit and supplies KIT Use  up to four times daily as directed 1 each 0  . canagliflozin (INVOKANA) 100 MG TABS tablet Take 1 tablet (100 mg total) by mouth daily before breakfast. 30 tablet 3  . dexlansoprazole (DEXILANT) 60 MG capsule Take 1qam ac 90 capsule 3  . diphenhydrAMINE (BENADRYL) 2 % cream Apply 1 application topically as needed for itching.    . docusate sodium (COLACE) 100 MG capsule Take 100 mg by mouth daily as needed for mild constipation.    . Ferrous Sulfate (IRON) 325 (65 Fe) MG TABS TAKE 1 TABLET BY MOUTH TWICE DAILY WITH A MEAL 180 tablet 0  . glipiZIDE (GLUCOTROL) 10 MG tablet TAKE 1 TABLET BY MOUTH TWICE DAILY BEFORE A MEAL 180 tablet 0  . labetalol (NORMODYNE) 100 MG tablet Take 0.5 tablets (50 mg total) by mouth 2 (two) times daily. 90 tablet 0  . levothyroxine (EUTHYROX) 75 MCG tablet TAKE 1 TABLET BY MOUTH ONCE DAILY BEFORE BREAKFAST 30 tablet 2  . losartan (COZAAR) 25 MG tablet Take 1 tablet (25 mg total) by mouth daily. 30 tablet 3  . Multiple Vitamin (MULTIVITAMIN WITH MINERALS) TABS tablet Take 1 tablet by mouth at bedtime.     . mupirocin ointment (BACTROBAN) 2 % Apply into wound twice daily. 22 g 0  . simvastatin (ZOCOR) 10 MG tablet Take 1 tablet (10 mg total) by mouth at bedtime. 90 tablet 3   No current facility-administered medications for this visit.   Facility-Administered Medications Ordered in Other Visits  Medication Dose Route Frequency Provider Last Rate Last Admin  . promethazine (PHENERGAN) injection 12.5 mg  12.5 mg Intravenous Once Harle Stanford., PA-C      . Tbo-Filgrastim Galen Daft)  injection 300 mcg  300 mcg Subcutaneous Once Curt Bears, MD        SURGICAL HISTORY:  Past Surgical History:  Procedure Laterality Date  . CATARACT EXTRACTION Right   . CHOLECYSTECTOMY    . COLONOSCOPY  10/24/2009   normal rectum/1X1cm abnormal lesion in the ascending colon (bx benign). TI normal for 10cm.  Prep difficult/inadequate. f/u TCS 09/2012 recommended  . COLONOSCOPY   10/13/2004   Normal rectum/Diminutive polyps, splenic flexure, cold biopsied/removed.  Remainder of colonic mucosa appeared normal.  . COLONOSCOPY N/A 12/14/2012   VQQ:[id number] polyp-tubular adenoma  . ESOPHAGOGASTRODUODENOSCOPY  10/13/2004    Normal esophagus/ Nodular volcano like lesion in the antrum, either representing a  pancreatic rest or leiomyoma, biopsied.  Remainder of the gastric mucosa appeared normal, normal D1-D2  . ESOPHAGOGASTRODUODENOSCOPY  10/24/2009   Benign biopsies. normal esophagus/small hiatal hernia/nodular lesion antrum/distal greater curvature. duodenal AVM s/p ablation  . GIVENS CAPSULE STUDY  07/27/2010    multiple arteriovenous malformations which could definitely be the contributor to her drifting hemoglobin and hematocrit  . TUBAL LIGATION    . VIDEO BRONCHOSCOPY WITH ENDOBRONCHIAL ULTRASOUND N/A 04/27/2018   Procedure: VIDEO BRONCHOSCOPY WITH ENDOBRONCHIAL ULTRASOUND;  Surgeon: Melrose Nakayama, MD;  Location: [facility] OR;  Service: Thoracic;  Laterality: N/A;    REVIEW OF SYSTEMS:   Review of Systems  Constitutional: Negative for appetite change, chills, fatigue, fever and unexpected weight change.  HENT:   Negative for mouth sores, nosebleeds, sore throat and trouble swallowing.   Eyes: Negative for eye problems and icterus.  Respiratory: Negative for cough, hemoptysis, shortness of breath and wheezing.  Cardiovascular: Negative for chest pain and leg swelling.  Gastrointestinal: Negative for abdominal pain, constipation, diarrhea, nausea and vomiting.  Genitourinary: Negative for bladder incontinence, difficulty urinating, dysuria, frequency and hematuria.   Musculoskeletal: Negative for back pain, gait problem, neck pain and neck stiffness.  Skin: Negative for itching and rash.  Neurological: Negative for dizziness, extremity weakness, gait problem, headaches, light-headedness and seizures.  Hematological: Negative for adenopathy. Does not bruise/bleed easily.   Psychiatric/Behavioral: Negative for confusion, depression and sleep disturbance. The patient is not nervous/anxious.     PHYSICAL EXAMINATION:  Blood pressure (!) 165/70, pulse 100, temperature 98.2 F (36.8 C), temperature source Temporal, resp. rate 20, height '5\' 5"'$  (1.651 m), weight 174 lb 6.4 oz (79.1 kg), SpO2 99 %.  ECOG PERFORMANCE STATUS: 0 - Asymptomatic  Physical Exam  Constitutional: Oriented to person, place, and time and well-developed, well-nourished, and in no distress.  HENT:  Head: Normocephalic and atraumatic.  Mouth/Throat: Oropharynx is clear and moist. No oropharyngeal exudate.  Eyes: Conjunctivae are normal. Right eye exhibits no discharge. Left eye exhibits no discharge. No scleral icterus.  Neck: Normal range of motion. Neck supple.  Cardiovascular: Normal rate, regular rhythm, normal heart sounds and intact distal pulses.   Pulmonary/Chest: Effort normal and breath sounds normal. No respiratory distress. No wheezes. No rales.  Abdominal: Soft. Bowel sounds are normal. Exhibits no distension and no mass. There is no tenderness.  Musculoskeletal: Normal range of motion. Exhibits no edema.  Lymphadenopathy:    No cervical adenopathy.  Neurological: Alert and oriented to person, place, and time. Exhibits normal muscle tone. Gait normal. Coordination normal.  Skin: Skin is warm and dry. No rash noted. Not diaphoretic. No erythema. No pallor.  Psychiatric: Mood, memory and judgment normal.  Vitals reviewed.  LABORATORY DATA: Lab Results  Component Value Date   WBC 3.4 (L) 08/13/2019   HGB  11.0 (L) 08/13/2019   HCT 35.6 (L) 08/13/2019   MCV 90.1 08/13/2019   PLT 204 08/13/2019      Chemistry      Component Value Date/Time   NA 136 08/13/2019 1425   K 4.8 08/13/2019 1425   CL 106 08/13/2019 1425   CO2 20 (L) 08/13/2019 1425   BUN 30 (H) 08/13/2019 1425   BUN 12 12/10/2011 0919   CREATININE 1.29 (H) 08/13/2019 1425   CREATININE 0.72 12/10/2011 0919       Component Value Date/Time   CALCIUM 9.4 08/13/2019 1425   ALKPHOS 87 08/13/2019 1425   ALKPHOS 88 12/10/2011 0919   AST 17 08/13/2019 1425   ALT 14 08/13/2019 1425   BILITOT <0.2 (L) 08/13/2019 1425       RADIOGRAPHIC STUDIES:  CT Chest W Contrast  Result Date: 08/13/2019 CLINICAL DATA:  Stage IIIB right middle lobe non-small cell lung cancer status post concurrent chemo radiation therapy and consolidation immunotherapy. Restaging. EXAM: CT CHEST WITH CONTRAST TECHNIQUE: Multidetector CT imaging of the chest was performed during intravenous contrast administration. CONTRAST:  33m OMNIPAQUE IOHEXOL 300 MG/ML  SOLN COMPARISON:  04/16/2019 chest CT. FINDINGS: Cardiovascular: Normal heart size. No significant pericardial effusion/thickening. Left anterior descending and right coronary atherosclerosis. Atherosclerotic nonaneurysmal thoracic aorta. Normal caliber pulmonary arteries. No central pulmonary emboli. Mediastinum/Nodes: No discrete thyroid nodules. Unremarkable esophagus. No axillary adenopathy. Stable mildly enlarged 1.0 cm right hilar node using similar measurement technique (series 2/image 55). Stable enlarged 1.6 cm high right paraesophageal node (series 2/image 29). No new pathologically enlarged mediastinal or hilar lymph nodes. Lungs/Pleura: No pneumothorax. No pleural effusion. Mild centrilobular emphysema. Irregular right middle lobe 1.7 x 1.6 cm pulmonary nodule (series 7/image 63), previously 1.7 x 1.7 cm, stable. Somewhat sharply marginated patchy perihilar and upper paramediastinal right lung consolidation, similar with increased volume loss and distortion, compatible with evolving postradiation change. No new significant pulmonary nodules. Upper abdomen: No acute abnormality. Musculoskeletal: No aggressive appearing focal osseous lesions. Moderate thoracic spondylosis. Healing transverse sternal fracture, nondisplaced, with increased sclerosis at the fracture site. IMPRESSION:  1. Stable treated right middle lobe pulmonary nodule. Expected evolution of postradiation changes in the right lung. 2. Stable right hilar and high right paraesophageal lymphadenopathy. 3. No new or progressive metastatic disease in the chest. Aortic Atherosclerosis (ICD10-I70.0) and Emphysema (ICD10-J43.9). Electronically Signed   By: JIlona SorrelM.D.   On: 08/13/2019 18:04     ASSESSMENT/PLAN:  This is a very pleasant 73year old African-American female diagnosed with stageIIIbnon-small cell lung cancer, squamous cell carcinoma. She presented with a large right middle lobe lung mass in addition to mediastinal and bilateral hilar lymphadenopathy. She also hassuspicious right supraclavicular lymphadenopathy. She was diagnosed in August 2019.  She previously underwent concurrent chemoradiation with carboplatin and paclitaxel. She is status post 5 cycles with a partial response. She tolerated treatment fairlywell except for pancytopenia and fatigue.  She recently completed 26 cycles of consolidation immunotherapy with Imfinzi 10 mg/kg IV every 2 weeks.   She recently had a restaging CT scan. Dr. MJulien Nordmannpersonally and independently reviewed the scan and discussed the results with the patient. The scan did not show any evidence of disease progression. Dr. MJulien Nordmannrecommends that she proceed on observation with a restaging CT scan in 3 months.   We will see her back for a follow up visit in 3 months for evaluation and to review her scan.   The patient's blood pressure was noted to be elevated while in the  clinic today. She forgot to take one of her blood pressure medications today per patient report. She was instructed to monitor her blood pressure closely at home and keep a log of her readings. If her blood pressure continues to be elevated, then she was encouraged to make a follow up appointment with her PCP for consideration of adjustment of her anti-hypertensives.   The patient was  advised to call immediately if she has any concerning symptoms in the interval. The patient voices understanding of current disease status and treatment options and is in agreement with the current care plan. All questions were answered. The patient knows to call the clinic with any problems, questions or concerns. We can certainly see the patient much sooner if necessary   Orders Placed This Encounter  Procedures  . CT Chest W Contrast    Standing Status:   Future    Standing Expiration Date:   08/14/2020    Order Specific Question:   ** REASON FOR EXAM (FREE TEXT)    Answer:   Restaging Lung Cancer    Order Specific Question:   If indicated for the ordered procedure, I authorize the administration of contrast media per Radiology protocol    Answer:   Yes    Order Specific Question:   Preferred imaging location?    Answer:   [facility]    Order Specific Question:   Radiology Contrast Protocol - do NOT remove file path    Answer:   [email or web address]  . CBC with Differential ([place] Only)    Standing Status:   Future    Standing Expiration Date:   08/14/2020  . CMP ([place] only)    Standing Status:   Future    Standing Expiration Date:   08/14/2020     Tobe Sos Tabytha Gradillas, PA-C 08/15/19  ADDENDUM: Hematology/Oncology Attending: I had a face-to-face encounter with the patient today.  I recommended her care plan.  This is a very pleasant 73 years old African-American female with history of stage IIIb non-small cell lung cancer, squamous cell carcinoma status post induction concurrent chemoradiation with weekly carboplatin and paclitaxel.  She also completed a course of consolidation immunotherapy with Imfinzi status post 26 cycles. The patient tolerated this course of the treatment fairly well. She had repeat CT scan of the chest performed recently.  I personally and independently reviewed the scans and discussed the results with  the patient today. Her scan showed no concerning findings for disease progression. I recommended for the patient to continue on observation with repeat CT scan of the chest in 3 months. For the anemia, she will continue on oral iron tablets. The patient was advised to call immediately if she has any concerning symptoms in the interval.  Disclaimer: This note was dictated with voice recognition software. Similar sounding words can inadvertently be transcribed and may be missed upon review. Eilleen Kempf, MD 08/15/19

## 2019-08-15 ENCOUNTER — Other Ambulatory Visit: Payer: Self-pay

## 2019-08-15 ENCOUNTER — Encounter: Payer: Self-pay | Admitting: Physician Assistant

## 2019-08-15 ENCOUNTER — Inpatient Hospital Stay (HOSPITAL_BASED_OUTPATIENT_CLINIC_OR_DEPARTMENT_OTHER): Payer: Medicare Other | Admitting: Physician Assistant

## 2019-08-15 ENCOUNTER — Ambulatory Visit: Payer: Medicare Other | Admitting: Physician Assistant

## 2019-08-15 VITALS — BP 165/70 | HR 100 | Temp 98.2°F | Resp 20 | Ht 65.0 in | Wt 174.4 lb

## 2019-08-15 DIAGNOSIS — C342 Malignant neoplasm of middle lobe, bronchus or lung: Secondary | ICD-10-CM

## 2019-08-15 DIAGNOSIS — Z5112 Encounter for antineoplastic immunotherapy: Secondary | ICD-10-CM | POA: Diagnosis not present

## 2019-08-22 ENCOUNTER — Other Ambulatory Visit: Payer: Self-pay | Admitting: Family Medicine

## 2019-08-22 NOTE — Telephone Encounter (Signed)
Last OV 06/11/19 Last fill 06/11/19  #180/0

## 2019-09-14 ENCOUNTER — Other Ambulatory Visit: Payer: Self-pay

## 2019-09-14 ENCOUNTER — Other Ambulatory Visit: Payer: Self-pay | Admitting: Family Medicine

## 2019-09-14 MED ORDER — LOSARTAN POTASSIUM 25 MG PO TABS: 25.0000 mg | ORAL_TABLET | Freq: Every day | ORAL | 3 refills | Status: DC

## 2019-09-24 ENCOUNTER — Telehealth: Payer: Self-pay | Admitting: Family Medicine

## 2019-09-24 NOTE — Telephone Encounter (Signed)
Patient is calling is calling and wanted to speak to someone regarding medication. CB is [phone]

## 2019-09-25 ENCOUNTER — Telehealth: Payer: Self-pay | Admitting: Family Medicine

## 2019-09-25 NOTE — Progress Notes (Signed)
Virtual Visit via Audio Note  I connected with patient on 09/26/19 at 10:15 AM EST by audio enabled telemedicine application and verified that I am speaking with the correct person using two identifiers.   THIS ENCOUNTER IS A VIRTUAL VISIT DUE TO COVID-19 - PATIENT WAS NOT SEEN IN THE OFFICE. PATIENT HAS CONSENTED TO VIRTUAL VISIT / TELEMEDICINE VISIT   Location of patient: home  Location of provider: office  I discussed the limitations of evaluation and management by telemedicine and the availability of in person appointments. The patient expressed understanding and agreed to proceed.   Subjective:   Jenna [name] is a 74 y.o. female who presents for an Initial Medicare Annual Wellness Visit.  Review of Systems    Home Safety/Smoke Alarms: Feels safe in home. Smoke alarms in place.  Lives alone in 1 story home.   Female:   Mammo- 05/04/18      Dexa scan- declines today       CCS- 12/19/18.    Objective:      Advanced Directives 09/26/2019 07/26/2019 03/22/2019 11/02/2018 09/06/2018 08/17/2018 06/12/2018  Does Patient Have a Medical Advance Directive? _0  No No  Would patient like information on creating a medical advance directive? No - Patient declined - No - Patient declined - - - -    Current Medications (verified) Outpatient Encounter Medications as of 09/26/2019  Medication Sig  . canagliflozin (INVOKANA) 100 MG TABS tablet Take 1 tablet (100 mg total) by mouth daily before breakfast.  . dexlansoprazole (DEXILANT) 60 MG capsule Take 1qam ac  . diphenhydrAMINE (BENADRYL) 2 % cream Apply 1 application topically as needed for itching.  . docusate sodium (COLACE) 100 MG capsule Take 100 mg by mouth daily as needed for mild constipation.  . Ferrous Sulfate (IRON) 325 (65 Fe) MG TABS TAKE 1 TABLET BY MOUTH TWICE DAILY WITH A MEAL  . glipiZIDE (GLUCOTROL) 10 MG tablet TAKE 1 TABLET BY MOUTH TWICE DAILY BEFORE A MEAL  . labetalol (NORMODYNE) 100 MG tablet Take 0.5  tablets (50 mg total) by mouth 2 (two) times daily.  [name] levothyroxine (EUTHYROX) 75 MCG tablet TAKE 1 TABLET BY MOUTH ONCE DAILY BEFORE BREAKFAST  . losartan (COZAAR) 25 MG tablet Take 1 tablet (25 mg total) by mouth daily.  . Multiple Vitamin (MULTIVITAMIN WITH MINERALS) TABS tablet Take 1 tablet by mouth at bedtime.   . mupirocin ointment (BACTROBAN) 2 % Apply into wound twice daily.  . simvastatin (ZOCOR) 10 MG tablet Take 1 tablet (10 mg total) by mouth at bedtime.  . blood glucose meter kit and supplies KIT Use up to four times daily as directed (Patient not taking: Reported on 09/26/2019)  . simvastatin (ZOCOR) 20 MG tablet TAKE 1 TABLET BY MOUTH ONCE DAILY IN THE EVENING (Patient not taking: Reported on 09/26/2019)   Facility-Administered Encounter Medications as of 09/26/2019  Medication  . promethazine (PHENERGAN) injection 12.5 mg  . Tbo-Filgrastim (GRANIX) injection 300 mcg    Allergies (verified) Paclitaxel, Aspirin, Esomeprazole magnesium, and Ace inhibitors   History: Past Medical History:  Diagnosis Date  . Blood transfusion without reported diagnosis   . Cataract   . DM (diabetes mellitus) ([place])   . GERD (gastroesophageal reflux disease)   . Hyperlipidemia   . Hypertension   . Iron deficiency anemia   . NSCL ca dx'd 03/2018  . Thyroid disease    Past Surgical History:  Procedure Laterality Date  . CATARACT EXTRACTION Right   . CHOLECYSTECTOMY    .  COLONOSCOPY  10/24/2009   normal rectum/1X1cm abnormal lesion in the ascending colon (bx benign). TI normal for 10cm.  Prep difficult/inadequate. f/u TCS 09/2012 recommended  . COLONOSCOPY  10/13/2004   Normal rectum/Diminutive polyps, splenic flexure, cold biopsied/removed.  Remainder of colonic mucosa appeared normal.  . COLONOSCOPY N/A 12/14/2012   TZG:[id number] polyp-tubular adenoma  . ESOPHAGOGASTRODUODENOSCOPY  10/13/2004    Normal esophagus/ Nodular volcano like lesion in the antrum, either representing a  pancreatic  rest or leiomyoma, biopsied.  Remainder of the gastric mucosa appeared normal, normal D1-D2  . ESOPHAGOGASTRODUODENOSCOPY  10/24/2009   Benign biopsies. normal esophagus/small hiatal hernia/nodular lesion antrum/distal greater curvature. duodenal AVM s/p ablation  . GIVENS CAPSULE STUDY  07/27/2010    multiple arteriovenous malformations which could definitely be the contributor to her drifting hemoglobin and hematocrit  . TUBAL LIGATION    . VIDEO BRONCHOSCOPY WITH ENDOBRONCHIAL ULTRASOUND N/A 04/27/2018   Procedure: VIDEO BRONCHOSCOPY WITH ENDOBRONCHIAL ULTRASOUND;  Surgeon: Melrose Nakayama, MD;  Location: [facility] OR;  Service: Thoracic;  Laterality: N/A;   Family History  Problem Relation Age of Onset  . Colon cancer Maternal Aunt        greater than age 25  . Breast cancer Cousin   . Amblyopia Neg Hx   . Blindness Neg Hx   . Cataracts Neg Hx   . Diabetes Neg Hx   . Glaucoma Neg Hx   . Macular degeneration Neg Hx   . Retinal detachment Neg Hx   . Strabismus Neg Hx   . Retinitis pigmentosa Neg Hx   . Rectal cancer Neg Hx   . Stomach cancer Neg Hx   . Colon polyps Neg Hx   . Esophageal cancer Neg Hx    Social History   Socioeconomic History  . Marital status: Single    Spouse name: Not on file  . Number of children: Not on file  . Years of education: Not on file  . Highest education level: Not on file  Occupational History  . Not on file  Tobacco Use  . Smoking status: Former Smoker    Packs/day: 0.50    Years: 55.00    Pack years: 27.50    Types: Cigarettes    Quit date: 03/18/2018    Years since quitting: 1.5  . Smokeless tobacco: Never Used  . Tobacco comment: smoked off and n  Substance and Sexual Activity  . Alcohol use: No  . Drug use: No  . Sexual activity: Not on file  Other Topics Concern  . Not on file  Social History Narrative  . Not on file   Social Determinants of Health   Financial Resource Strain: Low Risk   . Difficulty of Paying Living  Expenses: Not very hard  Food Insecurity: No Food Insecurity  . Worried About Charity fundraiser in the Last Year: Never true  . Ran Out of Food in the Last Year: Never true  Transportation Needs: No Transportation Needs  . Lack of Transportation (Medical): No  . Lack of Transportation (Non-Medical): No  Physical Activity:   . Days of Exercise per Week: Not on file  . Minutes of Exercise per Session: Not on file  Stress:   . Feeling of Stress : Not on file  Social Connections:   . Frequency of Communication with Friends and Family: Not on file  . Frequency of Social Gatherings with Friends and Family: Not on file  . Attends Religious Services: Not on file  . Active  Member of Clubs or Organizations: Not on file  . Attends Archivist Meetings: Not on file  . Marital Status: Not on file    Tobacco Counseling Counseling given: Not Answered Comment: smoked off and n   Clinical Intake:     Pain : No/denies pain                  Activities of Daily Living In your present state of health, do you have any difficulty performing the following activities: 09/26/2019 06/11/2019  Hearing? N N  Vision? N N  Difficulty concentrating or making decisions? N N  Walking or climbing stairs? N N  Dressing or bathing? N N  Doing errands, shopping? Y N  Comment pt does not drive but family will take her. -  Preparing Food and eating ? N -  Using the Toilet? N -  In the past six months, have you accidently leaked urine? N -  Do you have problems with loss of bowel control? N -  Managing your Medications? N -  Managing your Finances? N -  Housekeeping or managing your Housekeeping? N -  Some recent data might be hidden     Immunizations and Health Maintenance Immunization History  Administered Date(s) Administered  . Fluad Quad(high Dose 65+) 05/31/2019  . Influenza-Unspecified 04/16/2014  . Pneumococcal Conjugate-13 06/10/2014   Health Maintenance Due  Topic  Date Due  . Hepatitis C Screening  05-19-1946  . OPHTHALMOLOGY EXAM  06/01/1956  . TETANUS/TDAP  06/01/1965  . DEXA SCAN  06/02/2011  . PNA vac Low Risk Adult (2 of 2 - PPSV23) 06/11/2015  . FOOT EXAM  03/21/2019    Patient Care Team: [name], MD as PCP - General (Family Medicine) Gala Romney Cristopher Estimable, MD (Gastroenterology)  Indicate any recent Medical Services you may have received from other than [facility] providers in the past year (date may be approximate).     Assessment:   This is a routine wellness examination for Tamyia. Physical assessment deferred to PCP.   Hearing/Vision screen Unable to assess. This visit is enabled though telemedicine due to Covid 19.   Dietary issues and exercise activities discussed: Current Exercise Habits: The patient does not participate in regular exercise at present, Exercise limited by: None identified Diet (meal preparation, eat out, water intake, caffeinated beverages, dairy products, fruits and vegetables): well balanced, on average, 3 meals per day     Goals    . Increase physical activity      Depression Screen PHQ 2/9 Scores 09/26/2019 06/11/2019 03/20/2018  PHQ - 2 Score 0 0 0    Fall Risk Fall Risk  09/26/2019 03/20/2018  Falls in the past year? 0 No  Number falls in past yr: 0 -  Injury with Fall? 0 -  Follow up Education provided;Falls prevention discussed -   Cognitive Function:   Ad8 score reviewed for issues:  Issues making decisions:no  Less interest in hobbies / activities:no  Repeats questions, stories (family complaining):no  Trouble using ordinary gadgets (microwave, computer, phone):no  Forgets the month or year: no  Mismanaging finances: no  Remembering appts:no  Daily problems with thinking and/or memory:no Ad8 score is=0       Screening Tests Health Maintenance  Topic Date Due  . Hepatitis C Screening  [birth year]  . OPHTHALMOLOGY EXAM  06/01/1956  . TETANUS/TDAP  06/01/1965  . DEXA SCAN   06/02/2011  . PNA vac Low Risk Adult (2 of 2 - PPSV23) 06/11/2015  .  FOOT EXAM  03/21/2019  . HEMOGLOBIN A1C  12/10/2019  . MAMMOGRAM  05/03/2020  . COLONOSCOPY  12/18/2021  . INFLUENZA VACCINE  Completed     Plan:    Please schedule your next medicare wellness visit with me in 1 yr.  Continue to eat heart healthy diet (full of fruits, vegetables, whole grains, lean protein, water--limit salt, fat, and sugar intake) and increase physical activity as tolerated.  Continue doing brain stimulating activities (puzzles, reading, adult coloring books, staying active) to keep memory sharp.   Bring a copy of your living will and/or healthcare power of attorney to your next office visit.    I have personally reviewed and noted the following in the patient's chart:   . Medical and social history . Use of alcohol, tobacco or illicit drugs  . Current medications and supplements . Functional ability and status . Nutritional status . Physical activity . Advanced directives . List of other physicians . Hospitalizations, surgeries, and ER visits in previous 12 months . Vitals . Screenings to include cognitive, depression, and falls . Referrals and appointments  In addition, I have reviewed and discussed with patient certain preventive protocols, quality metrics, and best practice recommendations. A written personalized care plan for preventive services as well as general preventive health recommendations were provided to patient.     Shela Nevin, [place]   09/26/2019

## 2019-09-25 NOTE — Telephone Encounter (Signed)
I called patient to schedule her AWV and she mentioned that she had called the office to speak to a nurse about her medications. She picked up her Simvastatin and saw that it was 20mg  and she thought she was only supposed to be taking 10 mg. She seemed to have a lot  of questions about some of her other medications as well.Patient is requesting a call back.

## 2019-09-26 ENCOUNTER — Ambulatory Visit (INDEPENDENT_AMBULATORY_CARE_PROVIDER_SITE_OTHER): Payer: Medicare Other | Admitting: *Deleted

## 2019-09-26 ENCOUNTER — Encounter: Payer: Self-pay | Admitting: *Deleted

## 2019-09-26 DIAGNOSIS — Z0000 Encounter for general adult medical examination without abnormal findings: Secondary | ICD-10-CM

## 2019-09-26 NOTE — Patient Instructions (Addendum)
Please schedule your next medicare wellness visit with me in 1 yr.  Continue to eat heart healthy diet (full of fruits, vegetables, whole grains, lean protein, water--limit salt, fat, and sugar intake) and increase physical activity as tolerated.  Continue doing brain stimulating activities (puzzles, reading, adult coloring books, staying active) to keep memory sharp.   Bring a copy of your living will and/or healthcare power of attorney to your next office visit.   Jenna [name] , Thank you for taking time to come for your Medicare Wellness Visit. I appreciate your ongoing commitment to your health goals. Please review the following plan we discussed and let me know if I can assist you in the future.   These are the goals we discussed: Goals    . Increase physical activity       This is a list of the screening recommended for you and due dates:  Health Maintenance  Topic Date Due  .  Hepatitis C: One time screening is recommended by Center for Disease Control  (CDC) for  adults born from [birth year] through 1965.   1946-04-29  . Eye exam for diabetics  06/01/1956  . Tetanus Vaccine  06/01/1965  . DEXA scan (bone density measurement)  06/02/2011  . Pneumonia vaccines (2 of 2 - PPSV23) 06/11/2015  . Complete foot exam   03/21/2019  . Hemoglobin A1C  12/10/2019  . Mammogram  05/03/2020  . Colon Cancer Screening  12/18/2021  . Flu Shot  Completed    Preventive Care 65 Years and Older, Female Preventive care refers to lifestyle choices and visits with your health care provider that can promote health and wellness. This includes:  A yearly physical exam. This is also called an annual well check.  Regular dental and eye exams.  Immunizations.  Screening for certain conditions.  Healthy lifestyle choices, such as diet and exercise. What can I expect for my preventive care visit? Physical exam Your health care provider will check:  Height and weight. These may be used to calculate  body mass index (BMI), which is a measurement that tells if you are at a healthy weight.  Heart rate and blood pressure.  Your skin for abnormal spots. Counseling Your health care provider may ask you questions about:  Alcohol, tobacco, and drug use.  Emotional well-being.  Home and relationship well-being.  Sexual activity.  Eating habits.  History of falls.  Memory and ability to understand (cognition).  Work and work Statistician.  Pregnancy and menstrual history. What immunizations do I need?  Influenza (flu) vaccine  This is recommended every year. Tetanus, diphtheria, and pertussis (Tdap) vaccine  You may need a Td booster every 10 years. Varicella (chickenpox) vaccine  You may need this vaccine if you have not already been vaccinated. Zoster (shingles) vaccine  You may need this after age 64. Pneumococcal conjugate (PCV13) vaccine  One dose is recommended after age 94. Pneumococcal polysaccharide (PPSV23) vaccine  One dose is recommended after age 78. Measles, mumps, and rubella (MMR) vaccine  You may need at least one dose of MMR if you were born in 1957 or later. You may also need a second dose. Meningococcal conjugate (MenACWY) vaccine  You may need this if you have certain conditions. Hepatitis A vaccine  You may need this if you have certain conditions or if you travel or work in places where you may be exposed to hepatitis A. Hepatitis B vaccine  You may need this if you have certain conditions or if  you travel or work in places where you may be exposed to hepatitis B. Haemophilus influenzae type b (Hib) vaccine  You may need this if you have certain conditions. You may receive vaccines as individual doses or as more than one vaccine together in one shot (combination vaccines). Talk with your health care provider about the risks and benefits of combination vaccines. What tests do I need? Blood tests  Lipid and cholesterol levels. These may  be checked every 5 years, or more frequently depending on your overall health.  Hepatitis C test.  Hepatitis B test. Screening  Lung cancer screening. You may have this screening every year starting at age 50 if you have a 30-pack-year history of smoking and currently smoke or have quit within the past 15 years.  Colorectal cancer screening. All adults should have this screening starting at age 74 and continuing until age 86. Your health care provider may recommend screening at age 29 if you are at increased risk. You will have tests every 1-10 years, depending on your results and the type of screening test.  Diabetes screening. This is done by checking your blood sugar (glucose) after you have not eaten for a while (fasting). You may have this done every 1-3 years.  Mammogram. This may be done every 1-2 years. Talk with your health care provider about how often you should have regular mammograms.  BRCA-related cancer screening. This may be done if you have a family history of breast, ovarian, tubal, or peritoneal cancers. Other tests  Sexually transmitted disease (STD) testing.  Bone density scan. This is done to screen for osteoporosis. You may have this done starting at age 54. Follow these instructions at home: Eating and drinking  Eat a diet that includes fresh fruits and vegetables, whole grains, lean protein, and low-fat dairy products. Limit your intake of foods with high amounts of sugar, saturated fats, and salt.  Take vitamin and mineral supplements as recommended by your health care provider.  Do not drink alcohol if your health care provider tells you not to drink.  If you drink alcohol: ? Limit how much you have to 0-1 drink a day. ? Be aware of how much alcohol is in your drink. In the [place] one drink equals one 12 oz bottle of beer (355 mL), one 5 oz glass of wine (148 mL), or one 1 oz glass of hard liquor (44 mL). Lifestyle  Take daily care of your teeth and  gums.  Stay active. Exercise for at least 30 minutes on 5 or more days each week.  Do not use any products that contain nicotine or tobacco, such as cigarettes, e-cigarettes, and chewing tobacco. If you need help quitting, ask your health care provider.  If you are sexually active, practice safe sex. Use a condom or other form of protection in order to prevent STIs (sexually transmitted infections).  Talk with your health care provider about taking a low-dose aspirin or statin. What's next?  Go to your health care provider once a year for a well check visit.  Ask your health care provider how often you should have your eyes and teeth checked.  Stay up to date on all vaccines. This information is not intended to replace advice given to you by your health care provider. Make sure you discuss any questions you have with your health care provider. Document Revised: 07/27/2018 Document Reviewed: 07/27/2018 Elsevier Patient Education  2020 [employer].

## 2019-09-26 NOTE — Telephone Encounter (Signed)
TA-There is a separate phone encounter/I have already taken care of this/thx dmf

## 2019-09-26 NOTE — Telephone Encounter (Signed)
Can you please call her to get more information?

## 2019-09-26 NOTE — Telephone Encounter (Signed)
Talked to pt/thx dmf

## 2019-09-27 ENCOUNTER — Other Ambulatory Visit: Payer: Self-pay

## 2019-09-28 ENCOUNTER — Ambulatory Visit (INDEPENDENT_AMBULATORY_CARE_PROVIDER_SITE_OTHER): Payer: Medicare Other | Admitting: Family Medicine

## 2019-09-28 ENCOUNTER — Encounter: Payer: Self-pay | Admitting: Family Medicine

## 2019-09-28 VITALS — Temp 97.5°F | Ht 65.0 in | Wt 170.0 lb

## 2019-09-28 DIAGNOSIS — E785 Hyperlipidemia, unspecified: Secondary | ICD-10-CM

## 2019-09-28 DIAGNOSIS — D500 Iron deficiency anemia secondary to blood loss (chronic): Secondary | ICD-10-CM

## 2019-09-28 DIAGNOSIS — E119 Type 2 diabetes mellitus without complications: Secondary | ICD-10-CM

## 2019-09-28 DIAGNOSIS — C342 Malignant neoplasm of middle lobe, bronchus or lung: Secondary | ICD-10-CM

## 2019-09-28 DIAGNOSIS — E039 Hypothyroidism, unspecified: Secondary | ICD-10-CM | POA: Diagnosis not present

## 2019-09-28 MED ORDER — BLOOD GLUCOSE MONITOR KIT: PACK | 0 refills | Status: DC

## 2019-09-28 NOTE — Progress Notes (Signed)
Virtual Visit via Telephone Note  I connected with Jenna [name] on 09/28/19 at  1:00 PM EST by telephone and verified that I am speaking with the correct person using two identifiers.   I discussed the limitations, risks, security and privacy concerns of performing an evaluation and management service by telephone and the availability of in person appointments. I also discussed with the patient that there may be a patient responsible charge related to this service. The patient expressed understanding and agreed to proceed.  Location patient: home Location provider: work  Participants present for the call: patient, provider Patient did not have a visit in the prior 7 days to address this/these issue(s).  Chief Complaint  Patient presents with  . Establish Care     History of Present Illness: Jenna [name] is a 74 y.o. female who is formerly a patient of Dr. Deborra Medina who is establish care with me today. She has a PMHx significant for HTN, DM, hyperlipidemia, hypothyroidism, iron deficiency anemia, and lung cancer (non-small cell). She has had recent labs including CBC, CMP, TSH, lipid panel in 05/2019 and 07/2019. She does not need any medication refills at this time. She is due for A1C and needs Rx resent to pharm for glucometer.  Specialists: Oncology Dr. Julien Nordmann at [facility].   Past Medical History:  Diagnosis Date  . Blood transfusion without reported diagnosis   . Cataract   . DM (diabetes mellitus) ([place])   . GERD (gastroesophageal reflux disease)   . Hyperlipidemia   . Hypertension   . Iron deficiency anemia   . NSCL ca dx'd 03/2018  . Thyroid disease     Past Surgical History:  Procedure Laterality Date  . CATARACT EXTRACTION Right   . CHOLECYSTECTOMY    . COLONOSCOPY  10/24/2009   normal rectum/1X1cm abnormal lesion in the ascending colon (bx benign). TI normal for 10cm.  Prep difficult/inadequate. f/u TCS 09/2012 recommended  . COLONOSCOPY   10/13/2004   Normal rectum/Diminutive polyps, splenic flexure, cold biopsied/removed.  Remainder of colonic mucosa appeared normal.  . COLONOSCOPY N/A 12/14/2012   [id number] polyp-tubular adenoma  . ESOPHAGOGASTRODUODENOSCOPY  10/13/2004    Normal esophagus/ Nodular volcano like lesion in the antrum, either representing a  pancreatic rest or leiomyoma, biopsied.  Remainder of the gastric mucosa appeared normal, normal D1-D2  . ESOPHAGOGASTRODUODENOSCOPY  10/24/2009   Benign biopsies. normal esophagus/small hiatal hernia/nodular lesion antrum/distal greater curvature. duodenal AVM s/p ablation  . GIVENS CAPSULE STUDY  07/27/2010    multiple arteriovenous malformations which could definitely be the contributor to her drifting hemoglobin and hematocrit  . TUBAL LIGATION    . VIDEO BRONCHOSCOPY WITH ENDOBRONCHIAL ULTRASOUND N/A 04/27/2018   Procedure: VIDEO BRONCHOSCOPY WITH ENDOBRONCHIAL ULTRASOUND;  Surgeon: Melrose Nakayama, MD;  Location: [facility] OR;  Service: Thoracic;  Laterality: N/A;    Social History   Tobacco Use  . Smoking status: Former Smoker    Packs/day: 0.50    Years: 55.00    Pack years: 27.50    Types: Cigarettes    Quit date: 03/18/2018    Years since quitting: 1.5  . Smokeless tobacco: Never Used  . Tobacco comment: smoked off and n  Substance Use Topics  . Alcohol use: No  . Drug use: No    Family History  Problem Relation Age of Onset  . Colon cancer Maternal Aunt        greater than age 35  . Breast cancer Cousin   . Amblyopia  Neg Hx   . Blindness Neg Hx   . Cataracts Neg Hx   . Diabetes Neg Hx   . Glaucoma Neg Hx   . Macular degeneration Neg Hx   . Retinal detachment Neg Hx   . Strabismus Neg Hx   . Retinitis pigmentosa Neg Hx   . Rectal cancer Neg Hx   . Stomach cancer Neg Hx   . Colon polyps Neg Hx   . Esophageal cancer Neg Hx     Outpatient Encounter Medications as of 09/28/2019  Medication Sig  . blood glucose meter kit and supplies KIT Use up  to four times daily as directed  . canagliflozin (INVOKANA) 100 MG TABS tablet Take 1 tablet (100 mg total) by mouth daily before breakfast.  . dexlansoprazole (DEXILANT) 60 MG capsule Take 1qam ac  . diphenhydrAMINE (BENADRYL) 2 % cream Apply 1 application topically as needed for itching.  . docusate sodium (COLACE) 100 MG capsule Take 100 mg by mouth daily as needed for mild constipation.  . Ferrous Sulfate (IRON) 325 (65 Fe) MG TABS TAKE 1 TABLET BY MOUTH TWICE DAILY WITH A MEAL  . glipiZIDE (GLUCOTROL) 10 MG tablet TAKE 1 TABLET BY MOUTH TWICE DAILY BEFORE A MEAL  . labetalol (NORMODYNE) 100 MG tablet Take 0.5 tablets (50 mg total) by mouth 2 (two) times daily.  [name] levothyroxine (EUTHYROX) 75 MCG tablet TAKE 1 TABLET BY MOUTH ONCE DAILY BEFORE BREAKFAST  . losartan (COZAAR) 25 MG tablet Take 1 tablet (25 mg total) by mouth daily.  . Multiple Vitamin (MULTIVITAMIN WITH MINERALS) TABS tablet Take 1 tablet by mouth at bedtime.   . mupirocin ointment (BACTROBAN) 2 % Apply into wound twice daily.  . simvastatin (ZOCOR) 10 MG tablet Take 1 tablet (10 mg total) by mouth at bedtime.  . [DISCONTINUED] simvastatin (ZOCOR) 20 MG tablet TAKE 1 TABLET BY MOUTH ONCE DAILY IN THE EVENING (Patient not taking: Reported on 09/26/2019)   Facility-Administered Encounter Medications as of 09/28/2019  Medication  . promethazine (PHENERGAN) injection 12.5 mg  . Tbo-Filgrastim (GRANIX) injection 300 mcg     Allergies  Allergen Reactions  . Paclitaxel Other (See Comments)    Unresponsiveness shortly after Taxol inf started 06/12/18.  . Aspirin Other (See Comments)    Stomach bleeding   . Esomeprazole Magnesium     UNSPECIFIED REACTION   . Ace Inhibitors Other (See Comments)    Dizziness, drunk like      ROS: See pertinent positives and negatives per HPI.   Observations/Objective: Patient sounds cheerful and well on the phone. I do not appreciate any SOB. Speech and thought processing are grossly  intact. Patient reported vitals:  Temp (!) 97.5 F (36.4 C) (Temporal)   Ht _0  (1.651 m)   Wt 170 lb (77.1 kg)   BMI 28.29 kg/m    Assessment and Plan:  1. Hypothyroidism, unspecified type - on levothyroxine 22mg daily - TSH WNL in 07/2019  2. Type 2 diabetes mellitus without complication, without long-term current use of insulin ([place] - uncontrolled, last A1C = 9.8 in 05/2019 - invokana added in 06/2019 in addition to glipizide - Hemoglobin A1c; Future - Microalbumin / creatinine urine ratio; Future [facility] - Rx for glucometer and supplies to be faxed to pts pharm  3. Hyperlipidemia, unspecified hyperlipidemia type - on simvastatin 169mdaily - decreased from 20106mn 06/2019 - last FLP in 05/2019 - Lipid panel; Future  4.  Iron deficiency anemia due to chronic blood loss - cont iron supplement - last CBC in 07/2019  5. Malignant neoplasm of middle lobe of right lung ([place]) - follows with oncology Dr. Mckinley Jewel  Plan for q52mof/u  I did not refer this patient for an OV in the next 24 hours for this/these issue(s).  I discussed the assessment and treatment plan with the patient. The patient was provided an opportunity to ask questions and all were answered. The patient agreed with the plan and demonstrated an understanding of the instructions.   The patient was advised to call back or seek an in-person evaluation if the symptoms worsen or if the condition fails to improve as anticipated.  I provided 12 minutes of non-face-to-face time during this encounter.   MLetta Median DO

## 2019-10-08 ENCOUNTER — Other Ambulatory Visit: Payer: Self-pay

## 2019-10-08 MED ORDER — LOSARTAN POTASSIUM 25 MG PO TABS: 25.0000 mg | ORAL_TABLET | Freq: Every day | ORAL | 0 refills | Status: DC

## 2019-10-17 ENCOUNTER — Telehealth: Payer: Self-pay | Admitting: Internal Medicine

## 2019-10-17 NOTE — Telephone Encounter (Signed)
Called and spoke with patient. Confirmed 3/30 and 4/1 appts

## 2019-11-09 ENCOUNTER — Telehealth: Payer: Self-pay | Admitting: Family Medicine

## 2019-11-09 MED ORDER — CANAGLIFLOZIN 100 MG PO TABS: 100.0000 mg | ORAL_TABLET | Freq: Every day | ORAL | 3 refills | Status: DC

## 2019-11-09 MED ORDER — FARXIGA 5 MG PO TABS: 5.0000 mg | ORAL_TABLET | Freq: Every day | ORAL | 3 refills | Status: DC

## 2019-11-09 NOTE — Telephone Encounter (Signed)
Refill sent to pharmacy.   

## 2019-11-09 NOTE — Telephone Encounter (Signed)
PT needs refill of Invokana previously filled by Dr. Deborra Medina. She states she already called pharmacy and they will not fill until they get approval by Dr. Loletha Grayer.

## 2019-11-09 NOTE — Telephone Encounter (Signed)
Rx sent for farxiga 5mg  daily qAM to pharm on file

## 2019-11-09 NOTE — Telephone Encounter (Signed)
[employer] was calling wanting to speak to nurse but she was with a pt, call back: [phone], Reference Key: [id number] Please advise

## 2019-11-09 NOTE — Addendum Note (Signed)
Addended by: Ronnald Nian on: 11/09/2019 01:39 PM   Modules accepted: Orders

## 2019-11-09 NOTE — Telephone Encounter (Signed)
Please see other TE

## 2019-11-09 NOTE — Telephone Encounter (Signed)
I spoke with [employer] and they informed me that pt's insurance will cover [place] and [place].

## 2019-11-09 NOTE — Addendum Note (Signed)
Addended by: Ronnald Nian on: 11/09/2019 05:16 PM   Modules accepted: Orders

## 2019-11-13 ENCOUNTER — Ambulatory Visit (HOSPITAL_COMMUNITY)
Admission: RE | Admit: 2019-11-13 | Discharge: 2019-11-13 | Disposition: A | Discharge status: Home / Self Care | Payer: Medicare Other | Source: Ambulatory Visit | Attending: Physician Assistant | Admitting: Physician Assistant

## 2019-11-13 ENCOUNTER — Other Ambulatory Visit: Payer: Medicare Other

## 2019-11-13 ENCOUNTER — Telehealth: Payer: Self-pay | Admitting: Family Medicine

## 2019-11-13 ENCOUNTER — Inpatient Hospital Stay: Payer: Medicare Other | Attending: Internal Medicine

## 2019-11-13 ENCOUNTER — Other Ambulatory Visit: Payer: Self-pay

## 2019-11-13 DIAGNOSIS — E119 Type 2 diabetes mellitus without complications: Secondary | ICD-10-CM | POA: Insufficient documentation

## 2019-11-13 DIAGNOSIS — C342 Malignant neoplasm of middle lobe, bronchus or lung: Secondary | ICD-10-CM | POA: Insufficient documentation

## 2019-11-13 DIAGNOSIS — E039 Hypothyroidism, unspecified: Secondary | ICD-10-CM | POA: Diagnosis not present

## 2019-11-13 DIAGNOSIS — I10 Essential (primary) hypertension: Secondary | ICD-10-CM | POA: Diagnosis not present

## 2019-11-13 LAB — CMP (CANCER CENTER ONLY)
ALT: 24 U/L (ref 0–44)
AST: 23 U/L (ref 15–41)
Albumin: 3.6 g/dL (ref 3.5–5.0)
Alkaline Phosphatase: 94 U/L (ref 38–126)
Anion gap: 8 (ref 5–15)
BUN: 18 mg/dL (ref 8–23)
CO2: 20 mmol/L — ABNORMAL LOW (ref 22–32)
Calcium: 9.3 mg/dL (ref 8.9–10.3)
Chloride: 109 mmol/L (ref 98–111)
Creatinine: 1.05 mg/dL — ABNORMAL HIGH (ref 0.44–1.00)
GFR, Est AFR Am: >60 mL/min (ref >60)
GFR, Estimated: 53 mL/min — ABNORMAL LOW (ref >60)
Glucose, Bld: 211 mg/dL — ABNORMAL HIGH (ref 70–99)
Potassium: 4.3 mmol/L (ref 3.5–5.1)
Sodium: 137 mmol/L (ref 135–145)
Total Bilirubin: 0.2 mg/dL — ABNORMAL LOW (ref 0.3–1.2)
Total Protein: 7.9 g/dL (ref 6.5–8.1)

## 2019-11-13 LAB — CBC WITH DIFFERENTIAL (CANCER CENTER ONLY)
Abs Immature Granulocytes: 0.02 10*3/uL (ref 0.00–0.07)
Basophils Absolute: 0 10*3/uL (ref 0.0–0.1)
Basophils Relative: 1 %
Eosinophils Absolute: 0.1 10*3/uL (ref 0.0–0.5)
Eosinophils Relative: 2 %
HCT: 32.9 % — ABNORMAL LOW (ref 36.0–46.0)
Hemoglobin: 10 g/dL — ABNORMAL LOW (ref 12.0–15.0)
Immature Granulocytes: 1 %
Lymphocytes Relative: 9 %
Lymphs Abs: 0.4 10*3/uL — ABNORMAL LOW (ref 0.7–4.0)
MCH: 27.4 pg (ref 26.0–34.0)
MCHC: 30.4 g/dL (ref 30.0–36.0)
MCV: 90.1 fL (ref 80.0–100.0)
Monocytes Absolute: 0.3 10*3/uL (ref 0.1–1.0)
Monocytes Relative: 7 %
Neutro Abs: 3.2 10*3/uL (ref 1.7–7.7)
Neutrophils Relative %: 80 %
Platelet Count: 211 10*3/uL (ref 150–400)
RBC: 3.65 MIL/uL — ABNORMAL LOW (ref 3.87–5.11)
RDW: 14.7 % (ref 11.5–15.5)
WBC Count: 3.9 10*3/uL — ABNORMAL LOW (ref 4.0–10.5)
nRBC: 0 % (ref 0.0–0.2)

## 2019-11-13 MED ORDER — IOHEXOL 300 MG/ML  SOLN
75.0000 mL | Freq: Once | INTRAMUSCULAR | Status: AC | PRN
  Administered 2019-11-13: 75 mL via INTRAVENOUS

## 2019-11-13 MED ORDER — SODIUM CHLORIDE (PF) 0.9 % IJ SOLN
INTRAMUSCULAR | Status: AC
  Filled 2019-11-13: qty 50

## 2019-11-13 NOTE — Telephone Encounter (Signed)
Patient is calling and wanted to speak to someone regarding medication. CB is [phone]

## 2019-11-13 NOTE — Telephone Encounter (Signed)
Pt aware of medication change 

## 2019-11-13 NOTE — Telephone Encounter (Signed)
Pt aware of medication sent in.

## 2019-11-15 ENCOUNTER — Encounter: Payer: Self-pay | Admitting: Internal Medicine

## 2019-11-15 ENCOUNTER — Inpatient Hospital Stay: Payer: Medicare Other | Attending: Internal Medicine | Admitting: Internal Medicine

## 2019-11-15 ENCOUNTER — Other Ambulatory Visit: Payer: Self-pay

## 2019-11-15 VITALS — BP 161/58 | HR 100 | Temp 98.0°F | Resp 20 | Ht 65.0 in | Wt 180.2 lb

## 2019-11-15 DIAGNOSIS — Z9221 Personal history of antineoplastic chemotherapy: Secondary | ICD-10-CM | POA: Insufficient documentation

## 2019-11-15 DIAGNOSIS — Z79899 Other long term (current) drug therapy: Secondary | ICD-10-CM | POA: Insufficient documentation

## 2019-11-15 DIAGNOSIS — C3490 Malignant neoplasm of unspecified part of unspecified bronchus or lung: Secondary | ICD-10-CM | POA: Diagnosis not present

## 2019-11-15 DIAGNOSIS — C342 Malignant neoplasm of middle lobe, bronchus or lung: Secondary | ICD-10-CM

## 2019-11-15 DIAGNOSIS — Z7984 Long term (current) use of oral hypoglycemic drugs: Secondary | ICD-10-CM | POA: Diagnosis not present

## 2019-11-15 DIAGNOSIS — E785 Hyperlipidemia, unspecified: Secondary | ICD-10-CM | POA: Diagnosis not present

## 2019-11-15 DIAGNOSIS — E039 Hypothyroidism, unspecified: Secondary | ICD-10-CM | POA: Diagnosis not present

## 2019-11-15 DIAGNOSIS — E079 Disorder of thyroid, unspecified: Secondary | ICD-10-CM | POA: Insufficient documentation

## 2019-11-15 DIAGNOSIS — I10 Essential (primary) hypertension: Secondary | ICD-10-CM | POA: Insufficient documentation

## 2019-11-15 MED ORDER — METHYLPREDNISOLONE 4 MG PO TBPK: ORAL_TABLET | ORAL | 0 refills | Status: DC

## 2019-11-15 NOTE — Progress Notes (Signed)
[facility] [phone]   [phone]  OFFICE PROGRESS NOTE  [place] [address]  DIAGNOSIS: Stage IIIB (T3,N3, M0)non-small cell lung cancer, squamous cell carcinoma presented with large right middle lobe lung breast in addition to mediastinal and bilateral hilar lymphadenopathy and suspicious right supraclavicular lymph node diagnosed in August 2019.   PRIOR THERAPY:  1) A course of concurrent chemoradiation with weekly carboplatin for AUC of 2 and paclitaxel 45 MG/M2.  First dose of chemotherapy given on 05/29/2018.  Status post 5 cycles. 2) Consolidation treatment with immunotherapy with Imfinzi 10 mg/KG every 2 weeks.  First dose August 10, 2018.  Status post 26 cycles.  CURRENT THERAPY:  Observation.  INTERVAL HISTORY: Jenna [name] 74 y.o. female returns to the clinic today for follow-up visit.  The patient is feeling fine today with no concerning complaints except for shortness of breath with exertion with mild cough and no hemoptysis.  She denied having any chest pain.  She denied having any fever or chills.  She has no nausea, vomiting, diarrhea or constipation.  She has no headache or visual changes.  The patient denied having any recent weight loss or night sweats.  She completed a course of consolidation treatment with immunotherapy with Imfinzi and tolerated her treatment well.She had repeat CT scan of the chest performed recently and she is here for evaluation and discussion of her risk her results.   MEDICAL HISTORY: Past Medical History:  Diagnosis Date  . Blood transfusion without reported diagnosis   . Cataract   . DM (diabetes mellitus) ([place])   . GERD (gastroesophageal reflux disease)   . Hyperlipidemia   . Hypertension   . Iron deficiency anemia   . NSCL ca dx'd 03/2018  . Thyroid disease     ALLERGIES:  is allergic to paclitaxel; aspirin; esomeprazole  magnesium; and ace inhibitors.  MEDICATIONS:  Current Outpatient Medications  Medication Sig Dispense Refill  . blood glucose meter kit and supplies KIT Use up to four times daily as directed 1 each 0  . dapagliflozin propanediol (FARXIGA) 5 MG TABS tablet Take 5 mg by mouth daily before breakfast. 90 tablet 3  . dexlansoprazole (DEXILANT) 60 MG capsule Take 1qam ac 90 capsule 3  . diphenhydrAMINE (BENADRYL) 2 % cream Apply 1 application topically as needed for itching.    . docusate sodium (COLACE) 100 MG capsule Take 100 mg by mouth daily as needed for mild constipation.    . Ferrous Sulfate (IRON) 325 (65 Fe) MG TABS TAKE 1 TABLET BY MOUTH TWICE DAILY WITH A MEAL 180 tablet 0  . glipiZIDE (GLUCOTROL) 10 MG tablet TAKE 1 TABLET BY MOUTH TWICE DAILY BEFORE A MEAL 180 tablet 0  . labetalol (NORMODYNE) 100 MG tablet Take 0.5 tablets (50 mg total) by mouth 2 (two) times daily. 90 tablet 0  . levothyroxine (EUTHYROX) 75 MCG tablet TAKE 1 TABLET BY MOUTH ONCE DAILY BEFORE BREAKFAST 30 tablet 2  . losartan (COZAAR) 25 MG tablet Take 1 tablet (25 mg total) by mouth daily. 90 tablet 0  . Multiple Vitamin (MULTIVITAMIN WITH MINERALS) TABS tablet Take 1 tablet by mouth at bedtime.     . mupirocin ointment (BACTROBAN) 2 % Apply into wound twice daily. 22 g 0  . simvastatin (ZOCOR) 10 MG tablet Take 1 tablet (10 mg total) by mouth at bedtime. 90 tablet 3   No current facility-administered medications for this visit.  Facility-Administered Medications Ordered in Other Visits  Medication Dose Route Frequency Provider Last Rate Last Admin  . promethazine (PHENERGAN) injection 12.5 mg  12.5 mg Intravenous Once Harle Stanford., PA-C      . Tbo-Filgrastim (GRANIX) injection 300 mcg  300 mcg Subcutaneous Once Curt Bears, MD        SURGICAL HISTORY:  Past Surgical History:  Procedure Laterality Date  . CATARACT EXTRACTION Right   . CHOLECYSTECTOMY    . COLONOSCOPY  10/24/2009   normal rectum/1X1cm  abnormal lesion in the ascending colon (bx benign). TI normal for 10cm.  Prep difficult/inadequate. f/u TCS 09/2012 recommended  . COLONOSCOPY  10/13/2004   Normal rectum/Diminutive polyps, splenic flexure, cold biopsied/removed.  Remainder of colonic mucosa appeared normal.  . COLONOSCOPY N/A 12/14/2012   [id number] polyp-tubular adenoma  . ESOPHAGOGASTRODUODENOSCOPY  10/13/2004    Normal esophagus/ Nodular volcano like lesion in the antrum, either representing a  pancreatic rest or leiomyoma, biopsied.  Remainder of the gastric mucosa appeared normal, normal D1-D2  . ESOPHAGOGASTRODUODENOSCOPY  10/24/2009   Benign biopsies. normal esophagus/small hiatal hernia/nodular lesion antrum/distal greater curvature. duodenal AVM s/p ablation  . GIVENS CAPSULE STUDY  07/27/2010    multiple arteriovenous malformations which could definitely be the contributor to her drifting hemoglobin and hematocrit  . TUBAL LIGATION    . VIDEO BRONCHOSCOPY WITH ENDOBRONCHIAL ULTRASOUND N/A 04/27/2018   Procedure: VIDEO BRONCHOSCOPY WITH ENDOBRONCHIAL ULTRASOUND;  Surgeon: Melrose Nakayama, MD;  Location: [facility];  Service: Thoracic;  Laterality: N/A;    REVIEW OF SYSTEMS:  Constitutional: positive for fatigue Eyes: negative Ears, nose, mouth, throat, and face: negative Respiratory: positive for dyspnea on exertion Cardiovascular: negative Gastrointestinal: negative Genitourinary:negative Integument/breast: negative Hematologic/lymphatic: negative Musculoskeletal:negative Neurological: negative Behavioral/Psych: negative Endocrine: negative Allergic/Immunologic: negative   PHYSICAL EXAMINATION: General appearance: alert, cooperative and no distress Head: Normocephalic, without obvious abnormality, atraumatic Neck: no adenopathy, no JVD, supple, symmetrical, trachea midline and thyroid not enlarged, symmetric, no tenderness/mass/nodules Lymph nodes: Cervical, supraclavicular, and axillary nodes normal. Resp:  clear to auscultation bilaterally Back: symmetric, no curvature. ROM normal. No CVA tenderness. Cardio: regular rate and rhythm, S1, S2 normal, no murmur, click, rub or gallop GI: soft, non-tender; bowel sounds normal; no masses,  no organomegaly Extremities: extremities normal, atraumatic, no cyanosis or edema Neurologic: Alert and oriented X 3, normal strength and tone. Normal symmetric reflexes. Normal coordination and gait  ECOG PERFORMANCE STATUS: 1 - Symptomatic but completely ambulatory  Blood pressure (!) 161/58, pulse 100, temperature 98 F (36.7 C), temperature source Oral, resp. rate 20, height '5\' 5"'$  (1.651 m), weight 180 lb 3.2 oz (81.7 kg), SpO2 100 %.  LABORATORY DATA: Lab Results  Component Value Date   WBC 3.9 (L) 11/13/2019   HGB 10.0 (L) 11/13/2019   HCT 32.9 (L) 11/13/2019   MCV 90.1 11/13/2019   PLT 211 11/13/2019      Chemistry      Component Value Date/Time   NA 137 11/13/2019 1041   K 4.3 11/13/2019 1041   CL 109 11/13/2019 1041   CO2 20 (L) 11/13/2019 1041   BUN 18 11/13/2019 1041   BUN 12 12/10/2011 0919   CREATININE 1.05 (H) 11/13/2019 1041   CREATININE 0.72 12/10/2011 0919      Component Value Date/Time   CALCIUM 9.3 11/13/2019 1041   ALKPHOS 94 11/13/2019 1041   ALKPHOS 88 12/10/2011 0919   AST 23 11/13/2019 1041   ALT 24 11/13/2019 1041   BILITOT 0.2 (L) 11/13/2019 1041  RADIOGRAPHIC STUDIES: CT Chest W Contrast  Result Date: 11/13/2019 CLINICAL DATA:  Restaging lung cancer, right lung cancer, s/p chemo, XRT, and consolidation immunotherapy, currently on observation EXAM: CT CHEST WITH CONTRAST TECHNIQUE: Multidetector CT imaging of the chest was performed during intravenous contrast administration. CONTRAST:  25m OMNIPAQUE IOHEXOL 300 MG/ML  SOLN COMPARISON:  08/13/2019, 04/16/2019 FINDINGS: Cardiovascular: Aortic atherosclerosis. Normal heart size. Three-vessel coronary artery calcifications. No pericardial effusion.  Mediastinum/Nodes: No significant change in enlarged pretracheal and right paraesophageal lymph nodes, largest paraesophageal node measuring 1.8 x 1.4 cm (series 2, image 33). Thyroid gland, trachea, and esophagus demonstrate no significant findings. Lungs/Pleura: Mild centrilobular emphysema. Significant interval increase in perihilar consolidation and fibrosis of the right lung, particularly of the right middle lobe. A treated right middle lobe nodule is difficult to distinguish from adjacent consolidation but is not grossly changed unchanged (series 7, image 71). Tiny, clustered ground-glass nodules in the bilateral lung bases,, unchanged and nonspecific, infectious or inflammatory. Small right pleural effusion, not significantly changed. Upper Abdomen: No acute abnormality. Musculoskeletal: No chest wall mass or suspicious bone lesions identified. IMPRESSION: 1. Significant interval increase in perihilar consolidation and fibrosis of the right lung, particularly of the right middle lobe, findings generally favored to reflect ongoing development of post treatment/post radiation change. A treated right middle lobe nodule is now difficult to distinguish from adjacent consolidation but is not grossly changed. 2. No significant change in enlarged pretracheal and right paraesophageal lymph nodes. 3. Unchanged small right pleural effusion. 4.  Emphysema (ICD10-J43.9). 5. Coronary artery disease.  Aortic Atherosclerosis (ICD10-I70.0). Electronically Signed   By: AEddie CandleM.D.   On: 11/13/2019 15:52    ASSESSMENT AND PLAN: This is a very pleasant 74years old African-American female recently diagnosed with a stage IIIB non-small cell lung cancer, squamous cell carcinoma.  She completed a course of concurrent chemoradiation with weekly carboplatin and paclitaxel status post 5 cycles with partial response.  She tolerated this treatment well except for the pancytopenia and fatigue.  The patient completed a course of  treatment with consolidation immunotherapy with Imfinzi status post 26 cycles.  She tolerated her treatment well with no concerning adverse effects. She had repeat CT scan of the chest performed recently.  I personally and independently reviewed the scans and discussed the results with the patient today. Her scan showed no concerning evidence for disease progression but she has increased energy consolidation of the right lung following the course of radiotherapy as well as immunotherapy. I recommended for the patient to continue on observation with repeat CT scan of the chest in 3 months for restaging of her disease. For the radiation/immunotherapy mediated pneumonitis, I started the patient on a short course of steroids with Medrol Dosepak.  I was planning to give her a long course of steroid but because of her diabetes this may be challenging for her to regulate her blood sugar.  She was still advised to monitor her blood sugar closely during the week of the Medrol Dosepak. For the hypertension she was advised to take her blood pressure medication as prescribed and to discuss with her primary care physician adjustment of her medication if needed. The patient was advised to call immediately if she has any other concerning symptoms in the interval.  The patient voices understanding of current disease status and treatment options and is in agreement with the current care plan. All questions were answered. The patient knows to call the clinic with any problems, questions or concerns. We  can certainly see the patient much sooner if necessary.  Disclaimer: This note was dictated with voice recognition software. Similar sounding words can inadvertently be transcribed and may not be corrected upon review.

## 2019-11-16 ENCOUNTER — Telehealth: Payer: Self-pay | Admitting: Internal Medicine

## 2019-11-16 NOTE — Telephone Encounter (Signed)
Scheduled per los. Called and spoke with patient. Confirmed appt 

## 2019-11-29 ENCOUNTER — Other Ambulatory Visit: Payer: Self-pay

## 2019-11-29 DIAGNOSIS — I160 Hypertensive urgency: Secondary | ICD-10-CM

## 2019-11-29 DIAGNOSIS — I10 Essential (primary) hypertension: Secondary | ICD-10-CM

## 2019-11-29 MED ORDER — LABETALOL HCL 100 MG PO TABS: 50.0000 mg | ORAL_TABLET | Freq: Two times a day (BID) | ORAL | 0 refills | Status: DC

## 2019-11-29 NOTE — Telephone Encounter (Signed)
Last ov 09/28/19 Last fill 06/11/19  #90/0

## 2019-12-25 ENCOUNTER — Other Ambulatory Visit: Payer: Self-pay

## 2019-12-25 MED ORDER — SIMVASTATIN 10 MG PO TABS: 10.0000 mg | ORAL_TABLET | Freq: Every day | ORAL | 3 refills | Status: DC

## 2019-12-25 NOTE — Telephone Encounter (Signed)
Last OV 09/28/19 Last fill 06/11/19  #90/3

## 2020-01-10 ENCOUNTER — Other Ambulatory Visit: Payer: Self-pay | Admitting: Physician Assistant

## 2020-01-10 DIAGNOSIS — E039 Hypothyroidism, unspecified: Secondary | ICD-10-CM

## 2020-02-12 ENCOUNTER — Ambulatory Visit (HOSPITAL_COMMUNITY)
Admission: RE | Admit: 2020-02-12 | Discharge: 2020-02-12 | Disposition: A | Discharge status: Home / Self Care | Payer: Medicare Other | Source: Ambulatory Visit | Attending: Internal Medicine | Admitting: Internal Medicine

## 2020-02-12 ENCOUNTER — Inpatient Hospital Stay: Payer: Medicare Other | Attending: Internal Medicine

## 2020-02-12 ENCOUNTER — Encounter (HOSPITAL_COMMUNITY): Payer: Self-pay

## 2020-02-12 ENCOUNTER — Other Ambulatory Visit: Payer: Self-pay

## 2020-02-12 ENCOUNTER — Telehealth: Payer: Self-pay | Admitting: Medical Oncology

## 2020-02-12 ENCOUNTER — Other Ambulatory Visit: Payer: Self-pay | Admitting: Physician Assistant

## 2020-02-12 ENCOUNTER — Telehealth: Payer: Self-pay

## 2020-02-12 DIAGNOSIS — D508 Other iron deficiency anemias: Secondary | ICD-10-CM | POA: Diagnosis not present

## 2020-02-12 DIAGNOSIS — C3490 Malignant neoplasm of unspecified part of unspecified bronchus or lung: Secondary | ICD-10-CM | POA: Insufficient documentation

## 2020-02-12 DIAGNOSIS — I10 Essential (primary) hypertension: Secondary | ICD-10-CM | POA: Diagnosis not present

## 2020-02-12 DIAGNOSIS — Z9221 Personal history of antineoplastic chemotherapy: Secondary | ICD-10-CM | POA: Insufficient documentation

## 2020-02-12 DIAGNOSIS — Z79899 Other long term (current) drug therapy: Secondary | ICD-10-CM | POA: Insufficient documentation

## 2020-02-12 DIAGNOSIS — Z7984 Long term (current) use of oral hypoglycemic drugs: Secondary | ICD-10-CM | POA: Diagnosis not present

## 2020-02-12 DIAGNOSIS — Z923 Personal history of irradiation: Secondary | ICD-10-CM | POA: Diagnosis not present

## 2020-02-12 DIAGNOSIS — E039 Hypothyroidism, unspecified: Secondary | ICD-10-CM

## 2020-02-12 DIAGNOSIS — Z85118 Personal history of other malignant neoplasm of bronchus and lung: Secondary | ICD-10-CM | POA: Diagnosis not present

## 2020-02-12 DIAGNOSIS — E079 Disorder of thyroid, unspecified: Secondary | ICD-10-CM | POA: Diagnosis not present

## 2020-02-12 LAB — CMP (CANCER CENTER ONLY)
ALT: 25 U/L (ref 0–44)
AST: 26 U/L (ref 15–41)
Albumin: 4 g/dL (ref 3.5–5.0)
Alkaline Phosphatase: 92 U/L (ref 38–126)
Anion gap: 11 (ref 5–15)
BUN: 24 mg/dL — ABNORMAL HIGH (ref 8–23)
CO2: 19 mmol/L — ABNORMAL LOW (ref 22–32)
Calcium: 9.9 mg/dL (ref 8.9–10.3)
Chloride: 104 mmol/L (ref 98–111)
Creatinine: 1.4 mg/dL — ABNORMAL HIGH (ref 0.44–1.00)
GFR, Est AFR Am: 43 mL/min — ABNORMAL LOW (ref >60)
GFR, Estimated: 37 mL/min — ABNORMAL LOW (ref >60)
Glucose, Bld: 320 mg/dL — ABNORMAL HIGH (ref 70–99)
Potassium: 4.5 mmol/L (ref 3.5–5.1)
Sodium: 134 mmol/L — ABNORMAL LOW (ref 135–145)
Total Bilirubin: 0.3 mg/dL (ref 0.3–1.2)
Total Protein: 8.1 g/dL (ref 6.5–8.1)

## 2020-02-12 LAB — CBC WITH DIFFERENTIAL (CANCER CENTER ONLY)
Abs Immature Granulocytes: 0.02 10*3/uL (ref 0.00–0.07)
Basophils Absolute: 0 10*3/uL (ref 0.0–0.1)
Basophils Relative: 1 %
Eosinophils Absolute: 0.1 10*3/uL (ref 0.0–0.5)
Eosinophils Relative: 1 %
HCT: 36.2 % (ref 36.0–46.0)
Hemoglobin: 11.2 g/dL — ABNORMAL LOW (ref 12.0–15.0)
Immature Granulocytes: 1 %
Lymphocytes Relative: 10 %
Lymphs Abs: 0.4 10*3/uL — ABNORMAL LOW (ref 0.7–4.0)
MCH: 28.1 pg (ref 26.0–34.0)
MCHC: 30.9 g/dL (ref 30.0–36.0)
MCV: 91 fL (ref 80.0–100.0)
Monocytes Absolute: 0.3 10*3/uL (ref 0.1–1.0)
Monocytes Relative: 7 %
Neutro Abs: 3.1 10*3/uL (ref 1.7–7.7)
Neutrophils Relative %: 80 %
Platelet Count: 212 10*3/uL (ref 150–400)
RBC: 3.98 MIL/uL (ref 3.87–5.11)
RDW: 14.9 % (ref 11.5–15.5)
WBC Count: 3.9 10*3/uL — ABNORMAL LOW (ref 4.0–10.5)
nRBC: 0 % (ref 0.0–0.2)

## 2020-02-12 LAB — CBC AND DIFFERENTIAL
Hemoglobin: 11.2 — AB (ref 12.0–16.0)
Platelets: 212 (ref 150–399)
WBC: 3.9

## 2020-02-12 LAB — BASIC METABOLIC PANEL
BUN: 24 — AB (ref 4–21)
Creatinine: 1.4 — AB (ref 0.5–1.1)

## 2020-02-12 LAB — HEPATIC FUNCTION PANEL
ALT: 25 (ref 7–35)
AST: 26 (ref 13–35)
Bilirubin, Total: 0.3

## 2020-02-12 LAB — COMPREHENSIVE METABOLIC PANEL
GFR calc Af Amer: 43
GFR calc non Af Amer: 37

## 2020-02-12 LAB — TSH: TSH: 99.487 u[IU]/mL — ABNORMAL HIGH (ref 0.308–3.960)

## 2020-02-12 MED ORDER — IOHEXOL 300 MG/ML  SOLN
60.0000 mL | Freq: Once | INTRAMUSCULAR | Status: AC | PRN
  Administered 2020-02-12: 60 mL via INTRAVENOUS

## 2020-02-12 MED ORDER — SODIUM CHLORIDE (PF) 0.9 % IJ SOLN
INTRAMUSCULAR | Status: AC
  Filled 2020-02-12: qty 50

## 2020-02-12 MED ORDER — LEVOTHYROXINE SODIUM 100 MCG PO TABS: ORAL_TABLET | ORAL | 1 refills | Status: DC

## 2020-02-12 NOTE — Telephone Encounter (Signed)
Per Cassie PA and Diane RN faxed TSH results to patients PCP Letta Median to [phone]. Fax returned back as complete and given to [employer]. Was unable to reach patient about refill on her medication but left voicemail for patient to call back [facility] and ask for [facility] nurse. Made Cassie and Diane aware.

## 2020-02-12 NOTE — Telephone Encounter (Signed)
Last Pharmacy  rx was 12/10/19 [facility] .

## 2020-02-12 NOTE — Telephone Encounter (Signed)
TSH - 99.487  Pt on levothyroxine  75 mcg. Last dose was may 28. She said the pharamcy was going to contact the provider.

## 2020-02-14 ENCOUNTER — Encounter: Payer: Self-pay | Admitting: Internal Medicine

## 2020-02-14 ENCOUNTER — Inpatient Hospital Stay: Payer: Medicare Other | Attending: Internal Medicine | Admitting: Internal Medicine

## 2020-02-14 ENCOUNTER — Other Ambulatory Visit: Payer: Self-pay

## 2020-02-14 VITALS — BP 144/69 | HR 96 | Temp 97.8°F | Resp 20 | Ht 65.0 in | Wt 181.3 lb

## 2020-02-14 DIAGNOSIS — C342 Malignant neoplasm of middle lobe, bronchus or lung: Secondary | ICD-10-CM

## 2020-02-14 DIAGNOSIS — I10 Essential (primary) hypertension: Secondary | ICD-10-CM | POA: Insufficient documentation

## 2020-02-14 DIAGNOSIS — Z7984 Long term (current) use of oral hypoglycemic drugs: Secondary | ICD-10-CM | POA: Insufficient documentation

## 2020-02-14 DIAGNOSIS — Z79899 Other long term (current) drug therapy: Secondary | ICD-10-CM | POA: Insufficient documentation

## 2020-02-14 DIAGNOSIS — E039 Hypothyroidism, unspecified: Secondary | ICD-10-CM | POA: Diagnosis not present

## 2020-02-14 DIAGNOSIS — E119 Type 2 diabetes mellitus without complications: Secondary | ICD-10-CM | POA: Diagnosis not present

## 2020-02-14 DIAGNOSIS — Z9221 Personal history of antineoplastic chemotherapy: Secondary | ICD-10-CM | POA: Insufficient documentation

## 2020-02-14 DIAGNOSIS — C3490 Malignant neoplasm of unspecified part of unspecified bronchus or lung: Secondary | ICD-10-CM

## 2020-02-14 DIAGNOSIS — E785 Hyperlipidemia, unspecified: Secondary | ICD-10-CM | POA: Diagnosis not present

## 2020-02-14 DIAGNOSIS — J439 Emphysema, unspecified: Secondary | ICD-10-CM | POA: Diagnosis not present

## 2020-02-14 DIAGNOSIS — Z7952 Long term (current) use of systemic steroids: Secondary | ICD-10-CM | POA: Insufficient documentation

## 2020-02-14 DIAGNOSIS — I2510 Atherosclerotic heart disease of native coronary artery without angina pectoris: Secondary | ICD-10-CM | POA: Insufficient documentation

## 2020-02-14 DIAGNOSIS — Z85118 Personal history of other malignant neoplasm of bronchus and lung: Secondary | ICD-10-CM | POA: Insufficient documentation

## 2020-02-14 NOTE — Progress Notes (Signed)
[facility] Telephone:([phone]   Fax:([phone]) [phone]  OFFICE PROGRESS NOTE  [place] [address]  DIAGNOSIS: Stage IIIB (T3,N3, M0)non-small cell lung cancer, squamous cell carcinoma presented with large right middle lobe lung breast in addition to mediastinal and bilateral hilar lymphadenopathy and suspicious right supraclavicular lymph node diagnosed in August 2019.   PRIOR THERAPY:  1) A course of concurrent chemoradiation with weekly carboplatin for AUC of 2 and paclitaxel 45 MG/M2.  First dose of chemotherapy given on 05/29/2018.  Status post 5 cycles. 2) Consolidation treatment with immunotherapy with Imfinzi 10 mg/KG every 2 weeks.  First dose August 10, 2018.  Status post 26 cycles.  CURRENT THERAPY:  Observation.  INTERVAL HISTORY: Jenna [name] 74 y.o. female returns to the clinic today for follow-up visit.  The patient is feeling fine today with no concerning complaints except for occasional shortness of breath with exertion.  She is currently fully vaccinated for the COVID-19.  She denied having any chest pain, cough or hemoptysis.  She denied having any fever or chills.  She has no nausea, vomiting, diarrhea or constipation.  She denied having any headache or visual changes.  She was not taking her thyroid medicine as prescribed.  The patient is here today for evaluation with repeat blood work as well as CT scan of the chest for restaging of her disease.   MEDICAL HISTORY: Past Medical History:  Diagnosis Date  . Blood transfusion without reported diagnosis   . Cataract   . DM (diabetes mellitus) ([place])   . GERD (gastroesophageal reflux disease)   . Hyperlipidemia   . Hypertension   . Iron deficiency anemia   . NSCL ca dx'd 03/2018  . Thyroid disease     ALLERGIES:  is allergic to paclitaxel, aspirin, esomeprazole magnesium, and ace inhibitors.  MEDICATIONS:  Current Outpatient  Medications  Medication Sig Dispense Refill  . blood glucose meter kit and supplies KIT Use up to four times daily as directed 1 each 0  . dapagliflozin propanediol (FARXIGA) 5 MG TABS tablet Take 5 mg by mouth daily before breakfast. 90 tablet 3  . dexlansoprazole (DEXILANT) 60 MG capsule Take 1qam ac 90 capsule 3  . diphenhydrAMINE (BENADRYL) 2 % cream Apply 1 application topically as needed for itching.    . docusate sodium (COLACE) 100 MG capsule Take 100 mg by mouth daily as needed for mild constipation.    . Ferrous Sulfate (IRON) 325 (65 Fe) MG TABS TAKE 1 TABLET BY MOUTH TWICE DAILY WITH A MEAL 180 tablet 0  . glipiZIDE (GLUCOTROL) 10 MG tablet TAKE 1 TABLET BY MOUTH TWICE DAILY BEFORE A MEAL 180 tablet 0  . labetalol (NORMODYNE) 100 MG tablet Take 0.5 tablets (50 mg total) by mouth 2 (two) times daily. 90 tablet 0  . levothyroxine (EUTHYROX) 100 MCG tablet TAKE 1 TABLET BY MOUTH ONCE DAILY BEFORE BREAKFAST 30 tablet 1  . losartan (COZAAR) 25 MG tablet Take 1 tablet (25 mg total) by mouth daily. 90 tablet 0  . methylPREDNISolone (MEDROL DOSEPAK) 4 MG TBPK tablet Use as instructed. 21 tablet 0  . Multiple Vitamin (MULTIVITAMIN WITH MINERALS) TABS tablet Take 1 tablet by mouth at bedtime.     . mupirocin ointment (BACTROBAN) 2 % Apply into wound twice daily. 22 g 0  . simvastatin (ZOCOR) 10 MG tablet Take 1 tablet (10 mg total) by mouth at bedtime. 90 tablet 3  No current facility-administered medications for this visit.   Facility-Administered Medications Ordered in Other Visits  Medication Dose Route Frequency Provider Last Rate Last Admin  . promethazine (PHENERGAN) injection 12.5 mg  12.5 mg Intravenous Once Harle Stanford., PA-C      . Tbo-Filgrastim (GRANIX) injection 300 mcg  300 mcg Subcutaneous Once Curt Bears, MD        SURGICAL HISTORY:  Past Surgical History:  Procedure Laterality Date  . CATARACT EXTRACTION Right   . CHOLECYSTECTOMY    . COLONOSCOPY  10/24/2009     normal rectum/1X1cm abnormal lesion in the ascending colon (bx benign). TI normal for 10cm.  Prep difficult/inadequate. f/u TCS 09/2012 recommended  . COLONOSCOPY  10/13/2004   Normal rectum/Diminutive polyps, splenic flexure, cold biopsied/removed.  Remainder of colonic mucosa appeared normal.  . COLONOSCOPY N/A 12/14/2012   XNA:TFTDDUK polyp-tubular adenoma  . ESOPHAGOGASTRODUODENOSCOPY  10/13/2004    Normal esophagus/ Nodular volcano like lesion in the antrum, either representing a  pancreatic rest or leiomyoma, biopsied.  Remainder of the gastric mucosa appeared normal, normal D1-D2  . ESOPHAGOGASTRODUODENOSCOPY  10/24/2009   Benign biopsies. normal esophagus/small hiatal hernia/nodular lesion antrum/distal greater curvature. duodenal AVM s/p ablation  . GIVENS CAPSULE STUDY  07/27/2010    multiple arteriovenous malformations which could definitely be the contributor to her drifting hemoglobin and hematocrit  . TUBAL LIGATION    . VIDEO BRONCHOSCOPY WITH ENDOBRONCHIAL ULTRASOUND N/A 04/27/2018   Procedure: VIDEO BRONCHOSCOPY WITH ENDOBRONCHIAL ULTRASOUND;  Surgeon: Melrose Nakayama, MD;  Location: [facility];  Service: Thoracic;  Laterality: N/A;    REVIEW OF SYSTEMS:  Constitutional: positive for fatigue Eyes: negative Ears, nose, mouth, throat, and face: negative Respiratory: positive for dyspnea on exertion Cardiovascular: negative Gastrointestinal: negative Genitourinary:negative Integument/breast: negative Hematologic/lymphatic: negative Musculoskeletal:negative Neurological: negative Behavioral/Psych: negative Endocrine: negative Allergic/Immunologic: negative   PHYSICAL EXAMINATION: General appearance: alert, cooperative and no distress Head: Normocephalic, without obvious abnormality, atraumatic Neck: no adenopathy, no JVD, supple, symmetrical, trachea midline and thyroid not enlarged, symmetric, no tenderness/mass/nodules Lymph nodes: Cervical, supraclavicular, and  axillary nodes normal. Resp: clear to auscultation bilaterally Back: symmetric, no curvature. ROM normal. No CVA tenderness. Cardio: regular rate and rhythm, S1, S2 normal, no murmur, click, rub or gallop GI: soft, non-tender; bowel sounds normal; no masses,  no organomegaly Extremities: extremities normal, atraumatic, no cyanosis or edema Neurologic: Alert and oriented X 3, normal strength and tone. Normal symmetric reflexes. Normal coordination and gait  ECOG PERFORMANCE STATUS: 1 - Symptomatic but completely ambulatory  Blood pressure (!) 144/69, pulse 96, temperature 97.8 F (36.6 C), temperature source Temporal, resp. rate 20, height '5\' 5"'$  (1.651 m), weight 181 lb 4.8 oz (82.2 kg), SpO2 98 %.  LABORATORY DATA: Lab Results  Component Value Date   WBC 3.9 (L) 02/12/2020   HGB 11.2 (L) 02/12/2020   HCT 36.2 02/12/2020   MCV 91.0 02/12/2020   PLT 212 02/12/2020      Chemistry      Component Value Date/Time   NA 134 (L) 02/12/2020 0941   K 4.5 02/12/2020 0941   CL 104 02/12/2020 0941   CO2 19 (L) 02/12/2020 0941   BUN 24 (H) 02/12/2020 0941   BUN 24 (A) 02/12/2020 0000   CREATININE 1.40 (H) 02/12/2020 0941   CREATININE 0.72 12/10/2011 0919      Component Value Date/Time   CALCIUM 9.9 02/12/2020 0941   ALKPHOS 92 02/12/2020 0941   ALKPHOS 88 12/10/2011 0919   AST 26 02/12/2020 0941  ALT 25 02/12/2020 0941   BILITOT 0.3 02/12/2020 0941       RADIOGRAPHIC STUDIES: CT Chest W Contrast  Result Date: 02/12/2020 CLINICAL DATA:  Primary Cancer Type: Lung Imaging Indication: Routine surveillance Interval therapy since last imaging? No Initial Cancer Diagnosis Date: 04/27/2018; Established by: Biopsy-proven Detailed Pathology: Stage IIIB non-small cell lung cancer, squamous cell carcinoma Primary Tumor location: Right middle lobe Chemotherapy: Yes; Ongoing? No; Most recent administration: 08/03/2018 Immunotherapy?  Yes; Type: Imfinzi; Ongoing? No Radiation therapy? Yes; Date  Range: 05/24/2018-07/10/2018; Target: Right lung EXAM: CT CHEST WITH CONTRAST TECHNIQUE: Multidetector CT imaging of the chest was performed during intravenous contrast administration. CONTRAST:  54m OMNIPAQUE IOHEXOL 300 MG/ML  SOLN COMPARISON:  Most recent CT chest 11/13/2019.  04/28/2018 PET-CT. FINDINGS: Cardiovascular: Aortic atherosclerosis. Normal heart size. Three-vessel coronary artery calcifications. No pericardial effusion. Mediastinum/Nodes: Slight interval decrease in size of a right paraesophageal lymph node measuring 1.7 x 1.2 cm, previously 1.9 x 1.4 cm (series 2, image 37). Unchanged prominent pretracheal lymph nodes measuring up to 1.2 x 0.6 cm (series 2, image 58). No other enlarged mediastinal, hilar, or axillary lymph nodes. Thyroid gland, trachea, and esophagus demonstrate no significant findings. Lungs/Pleura: Mild centrilobular emphysema. Continued interval development of post treatment consolidation and fibrosis of the perihilar right lung, with essentially total fibrosis and volume loss of the right middle lobe. Unchanged small right pleural effusion. Unchanged tiny, scattered ground-glass nodules and opacities in the bilateral lung bases. Upper Abdomen: No acute abnormality. Musculoskeletal: No chest wall mass or suspicious bone lesions identified. IMPRESSION: 1. Continued interval development of post treatment consolidation and fibrosis of the perihilar right lung, with essentially total fibrosis and volume loss of the right middle lobe. 2. Slight interval decrease in size of a right paraesophageal lymph node. Unchanged prominent pretracheal lymph nodes. 3. Overall findings above are consistent with continued treatment response. 4. Unchanged small right pleural effusion. 5. Emphysema (ICD10-J43.9). 6. Coronary artery disease.  Aortic Atherosclerosis (ICD10-I70.0). Electronically Signed   By: AEddie CandleM.D.   On: 02/12/2020 11:53    ASSESSMENT AND PLAN: This is a very pleasant 74 years old African-American female recently diagnosed with a stage IIIB non-small cell lung cancer, squamous cell carcinoma.  She completed a course of concurrent chemoradiation with weekly carboplatin and paclitaxel status post 5 cycles with partial response.  She tolerated this treatment well except for the pancytopenia and fatigue.  The patient completed a course of treatment with consolidation immunotherapy with Imfinzi status post 26 cycles.  She tolerated her treatment well with no concerning adverse effects. The patient is currently on observation and she is feeling fine today with no concerning complaints except for persistent fatigue as well as shortness of breath with exertion. She had repeat CT scan of the chest performed recently.  I personally and independently reviewed the scans and discussed the results with the patient today. Her scan showed no concerning findings for disease progression. I recommended her to continue on observation with repeat CT scan of the chest in 4 months. For the hypothyroidism, she was not compliant with her thyroid medication.  We gave the patient a refill for levothyroxine 100 mcg p.o. daily.  She will follow with her primary care physician for close monitoring of her TSH and adjustment of her medication. The patient was advised to call immediately if she has any other concerning symptoms in the interval. The patient voices understanding of current disease status and treatment options and is in agreement with the  current care plan. All questions were answered. The patient knows to call the clinic with any problems, questions or concerns. We can certainly see the patient much sooner if necessary.  Disclaimer: This note was dictated with voice recognition software. Similar sounding words can inadvertently be transcribed and may not be corrected upon review.

## 2020-02-19 ENCOUNTER — Telehealth: Payer: Self-pay | Admitting: Internal Medicine

## 2020-02-19 NOTE — Telephone Encounter (Signed)
Scheduled per los. Called and spoke with patient. Confirmed appt 

## 2020-02-25 ENCOUNTER — Telehealth: Payer: Self-pay | Admitting: Family Medicine

## 2020-02-25 DIAGNOSIS — I160 Hypertensive urgency: Secondary | ICD-10-CM

## 2020-02-25 DIAGNOSIS — I10 Essential (primary) hypertension: Secondary | ICD-10-CM

## 2020-02-25 MED ORDER — LABETALOL HCL 100 MG PO TABS: 50.0000 mg | ORAL_TABLET | Freq: Two times a day (BID) | ORAL | 0 refills | Status: DC

## 2020-02-25 MED ORDER — GLIPIZIDE 10 MG PO TABS: ORAL_TABLET | ORAL | 0 refills | Status: DC

## 2020-02-25 NOTE — Telephone Encounter (Signed)
Pt has not been seen since 09/2019 and was to have labs, pt never got labs and never had 3 month chronic medical conditions appt. Pt was called and told short supply would be sent to pharmacy but she would have to be seen to get any further refills as we have not checked A1C since 05/2019. Pt said [place] and it is hard for her to come to appts. Pt advised we would have to have visit with lab work to continue to supply medications. Pt will call back to schedule. Meds sent for 30 days, Dexilant not sent, we do not RX this med.

## 2020-02-25 NOTE — Telephone Encounter (Signed)
Continued (computer error). Patient needs refill of labetalol, dexlansoprazole and glipizide. States she will run out Wednesday. Sending to Doc of the Day.

## 2020-02-25 NOTE — Telephone Encounter (Signed)
Patient needs refill of labetalol, de and glipizide. States she will run out on Wednesday.

## 2020-02-25 NOTE — Telephone Encounter (Signed)
Message below Luther. Please advise if need be

## 2020-02-27 NOTE — Telephone Encounter (Signed)
Patient returned Tequila's call regarding refills.

## 2020-02-27 NOTE — Telephone Encounter (Signed)
Called patient to go over refills and possibly switching locations if it's more convenient  to schedule for follow up and TOC, no answer LMTCB

## 2020-02-27 NOTE — Telephone Encounter (Signed)
Agree that pt needs appt. If another [name] office is more convenient and would allow her to have easier time with appts, please encourage her to switch.

## 2020-02-27 NOTE — Telephone Encounter (Signed)
Spoke with patient regarding possibly TOC to a location that's closer  to her. Patient will call to see about transferring and give [place] a back.

## 2020-03-06 ENCOUNTER — Other Ambulatory Visit: Payer: Self-pay

## 2020-03-07 ENCOUNTER — Ambulatory Visit (INDEPENDENT_AMBULATORY_CARE_PROVIDER_SITE_OTHER): Payer: Medicare Other | Admitting: Family Medicine

## 2020-03-07 ENCOUNTER — Encounter: Payer: Self-pay | Admitting: Family Medicine

## 2020-03-07 VITALS — BP 147/80 | HR 97 | Temp 97.1°F | Ht 65.0 in | Wt 179.6 lb

## 2020-03-07 DIAGNOSIS — E039 Hypothyroidism, unspecified: Secondary | ICD-10-CM

## 2020-03-07 DIAGNOSIS — I10 Essential (primary) hypertension: Secondary | ICD-10-CM | POA: Diagnosis not present

## 2020-03-07 DIAGNOSIS — E119 Type 2 diabetes mellitus without complications: Secondary | ICD-10-CM

## 2020-03-07 DIAGNOSIS — K219 Gastro-esophageal reflux disease without esophagitis: Secondary | ICD-10-CM

## 2020-03-07 DIAGNOSIS — E785 Hyperlipidemia, unspecified: Secondary | ICD-10-CM | POA: Diagnosis not present

## 2020-03-07 LAB — TSH: TSH: 1.71 u[IU]/mL (ref 0.35–4.50)

## 2020-03-07 LAB — HEMOGLOBIN A1C: Hgb A1c MFr Bld: 11.5 % — ABNORMAL HIGH (ref 4.6–6.5)

## 2020-03-07 LAB — T4, FREE: Free T4: 1.19 ng/dL (ref 0.60–1.60)

## 2020-03-07 MED ORDER — DAPAGLIFLOZIN PROPANEDIOL 5 MG PO TABS: 5.0000 mg | ORAL_TABLET | Freq: Every day | ORAL | 3 refills | Status: DC

## 2020-03-07 MED ORDER — LABETALOL HCL 100 MG PO TABS: 50.0000 mg | ORAL_TABLET | Freq: Two times a day (BID) | ORAL | 3 refills | Status: DC

## 2020-03-07 MED ORDER — GLIPIZIDE 10 MG PO TABS: ORAL_TABLET | ORAL | 3 refills | Status: DC

## 2020-03-07 MED ORDER — LOSARTAN POTASSIUM 25 MG PO TABS: 25.0000 mg | ORAL_TABLET | Freq: Every day | ORAL | 3 refills | Status: DC

## 2020-03-07 MED ORDER — LEVOTHYROXINE SODIUM 100 MCG PO TABS: ORAL_TABLET | ORAL | 3 refills | Status: DC

## 2020-03-07 MED ORDER — DEXILANT 60 MG PO CPDR: DELAYED_RELEASE_CAPSULE | ORAL | 3 refills | Status: DC

## 2020-03-07 MED ORDER — SIMVASTATIN 10 MG PO TABS: 10.0000 mg | ORAL_TABLET | Freq: Every day | ORAL | 3 refills | Status: DC

## 2020-03-07 NOTE — Progress Notes (Signed)
Chief Complaint  Patient presents with  . Diabetes    Follow up visit    HPI: *Jenna [name] is a 74 y.o. female here for DM, hyperlipidemia, hypothyroidism follow-up. She needs refills of all of her medications. She is due for A1C as well as f/u TFTs.  Pt with h/o lung cancer, RML - Dr. Julien Nordmann Pt had been non-compliant with her thyroid medication and most recent TFTs showed TSH 99-100.  Pt does not check BS at home.  Hypoglycemia/Hypergylcemic episodes: no  Diet: "trying to do what I'm supposed to do" but states "I am eating good" Exercise: limited to d/t lung cancer and SOB  Lab Results  Component Value Date   HGBA1C 9.8 (H) 06/11/2019   Lab Results  Component Value Date   MICROALBUR 37.5 (H) 06/11/2019   Lab Results  Component Value Date   CREATININE 1.40 (H) 02/12/2020   Lab Results  Component Value Date   CHOL 157 06/11/2019   HDL 39.20 06/11/2019   LDLCALC 93 06/11/2019   TRIG 124.0 06/11/2019   CHOLHDL 4 06/11/2019    The 10-year ASCVD risk score Mikey Bussing DC Jr., et al., 2013) is: 48.1%   Values used to calculate the score:     Age: 72 years     Sex: Female     Is Non-Hispanic African American: Yes     Diabetic: Yes     Tobacco smoker: Yes     Systolic Blood Pressure: 101 mmHg     Is BP treated: Yes     HDL Cholesterol: 39.2 mg/dL     Total Cholesterol: 157 mg/dL  Lab Results  Component Value Date   TSH 99.487 (H) 02/12/2020    Past Medical History:  Diagnosis Date  . Blood transfusion without reported diagnosis   . Cataract   . DM (diabetes mellitus) ([place])   . GERD (gastroesophageal reflux disease)   . Hyperlipidemia   . Hypertension   . Iron deficiency anemia   . NSCL ca dx'd 03/2018  . Thyroid disease     Past Surgical History:  Procedure Laterality Date  . CATARACT EXTRACTION Right   . CHOLECYSTECTOMY    . COLONOSCOPY  10/24/2009   normal rectum/1X1cm abnormal lesion in the ascending colon (bx benign). TI normal for 10cm.   Prep difficult/inadequate. f/u TCS 09/2012 recommended  . COLONOSCOPY  10/13/2004   Normal rectum/Diminutive polyps, splenic flexure, cold biopsied/removed.  Remainder of colonic mucosa appeared normal.  . COLONOSCOPY N/A 12/14/2012   BPZ:WCHENID polyp-tubular adenoma  . ESOPHAGOGASTRODUODENOSCOPY  10/13/2004    Normal esophagus/ Nodular volcano like lesion in the antrum, either representing a  pancreatic rest or leiomyoma, biopsied.  Remainder of the gastric mucosa appeared normal, normal D1-D2  . ESOPHAGOGASTRODUODENOSCOPY  10/24/2009   Benign biopsies. normal esophagus/small hiatal hernia/nodular lesion antrum/distal greater curvature. duodenal AVM s/p ablation  . GIVENS CAPSULE STUDY  07/27/2010    multiple arteriovenous malformations which could definitely be the contributor to her drifting hemoglobin and hematocrit  . TUBAL LIGATION    . VIDEO BRONCHOSCOPY WITH ENDOBRONCHIAL ULTRASOUND N/A 04/27/2018   Procedure: VIDEO BRONCHOSCOPY WITH ENDOBRONCHIAL ULTRASOUND;  Surgeon: Melrose Nakayama, MD;  Location: [facility] OR;  Service: Thoracic;  Laterality: N/A;    Social History   Socioeconomic History  . Marital status: Single    Spouse name: Not on file  . Number of children: Not on file  . Years of education: Not on file  . Highest education level: Not on  file  Occupational History  . Not on file  Tobacco Use  . Smoking status: Former Smoker    Packs/day: 0.50    Years: 55.00    Pack years: 27.50    Types: Cigarettes    Quit date: 03/18/2018    Years since quitting: 1.9  . Smokeless tobacco: Never Used  . Tobacco comment: smoked off and n  Vaping Use  . Vaping Use: Never used  Substance and Sexual Activity  . Alcohol use: No  . Drug use: No  . Sexual activity: Not on file  Other Topics Concern  . Not on file  Social History Narrative  . Not on file   Social Determinants of Health   Financial Resource Strain: Low Risk   . Difficulty of Paying Living Expenses: Not very hard    Food Insecurity: No Food Insecurity  . Worried About Charity fundraiser in the Last Year: Never true  . Ran Out of Food in the Last Year: Never true  Transportation Needs: No Transportation Needs  . Lack of Transportation (Medical): No  . Lack of Transportation (Non-Medical): No  Physical Activity:   . Days of Exercise per Week:   . Minutes of Exercise per Session:   Stress:   . Feeling of Stress :   Social Connections:   . Frequency of Communication with Friends and Family:   . Frequency of Social Gatherings with Friends and Family:   . Attends Religious Services:   . Active Member of Clubs or Organizations:   . Attends Archivist Meetings:   [name] Marital Status:   Intimate Partner Violence:   . Fear of Current or Ex-Partner:   . Emotionally Abused:   [name] Physically Abused:   . Sexually Abused:     Family History  Problem Relation Age of Onset  . Colon cancer Maternal Aunt        greater than age 19  . Breast cancer Cousin   . Amblyopia Neg Hx   . Blindness Neg Hx   . Cataracts Neg Hx   . Diabetes Neg Hx   . Glaucoma Neg Hx   . Macular degeneration Neg Hx   . Retinal detachment Neg Hx   . Strabismus Neg Hx   . Retinitis pigmentosa Neg Hx   . Rectal cancer Neg Hx   . Stomach cancer Neg Hx   . Colon polyps Neg Hx   . Esophageal cancer Neg Hx      Immunization History  Administered Date(s) Administered  . Fluad Quad(high Dose 65+) 05/31/2019  . Influenza-Unspecified 04/16/2014  . Pneumococcal Conjugate-13 06/10/2014    Outpatient Encounter Medications as of 03/07/2020  Medication Sig  . blood glucose meter kit and supplies KIT Use up to four times daily as directed  . dapagliflozin propanediol (FARXIGA) 5 MG TABS tablet Take 1 tablet (5 mg total) by mouth daily before breakfast.  . dexlansoprazole (DEXILANT) 60 MG capsule Take 1qam ac  . diphenhydrAMINE (BENADRYL) 2 % cream Apply 1 application topically as needed for itching.  . docusate sodium (COLACE)  100 MG capsule Take 100 mg by mouth daily as needed for mild constipation.  . Ferrous Sulfate (IRON) 325 (65 Fe) MG TABS TAKE 1 TABLET BY MOUTH TWICE DAILY WITH A MEAL  . glipiZIDE (GLUCOTROL) 10 MG tablet Take one tablet by mouth two times daily  . labetalol (NORMODYNE) 100 MG tablet Take 0.5 tablets (50 mg total) by mouth 2 (two) times daily.  [name] levothyroxine (  EUTHYROX) 100 MCG tablet TAKE 1 TABLET BY MOUTH ONCE DAILY BEFORE BREAKFAST  . losartan (COZAAR) 25 MG tablet Take 1 tablet (25 mg total) by mouth daily.  . methylPREDNISolone (MEDROL DOSEPAK) 4 MG TBPK tablet Use as instructed.  . Multiple Vitamin (MULTIVITAMIN WITH MINERALS) TABS tablet Take 1 tablet by mouth at bedtime.   . mupirocin ointment (BACTROBAN) 2 % Apply into wound twice daily.  . simvastatin (ZOCOR) 10 MG tablet Take 1 tablet (10 mg total) by mouth at bedtime.  . [DISCONTINUED] dapagliflozin propanediol (FARXIGA) 5 MG TABS tablet Take 5 mg by mouth daily before breakfast.  . [DISCONTINUED] dexlansoprazole (DEXILANT) 60 MG capsule Take 1qam ac  . [DISCONTINUED] glipiZIDE (GLUCOTROL) 10 MG tablet Take one tablet by mouth two times daily  . [DISCONTINUED] labetalol (NORMODYNE) 100 MG tablet Take 0.5 tablets (50 mg total) by mouth 2 (two) times daily.  . [DISCONTINUED] levothyroxine (EUTHYROX) 100 MCG tablet TAKE 1 TABLET BY MOUTH ONCE DAILY BEFORE BREAKFAST  . [DISCONTINUED] losartan (COZAAR) 25 MG tablet Take 1 tablet (25 mg total) by mouth daily.  . [DISCONTINUED] simvastatin (ZOCOR) 10 MG tablet Take 1 tablet (10 mg total) by mouth at bedtime.   Facility-Administered Encounter Medications as of 03/07/2020  Medication  . promethazine (PHENERGAN) injection 12.5 mg  . Tbo-Filgrastim (GRANIX) injection 300 mcg     ROS: Pertinent positives and negatives noted in HPI. Remainder of ROS non-contributory    Allergies  Allergen Reactions  . Paclitaxel Other (See Comments)    Unresponsiveness shortly after Taxol inf started  06/12/18.  . Aspirin Other (See Comments)    Stomach bleeding   . Esomeprazole Magnesium     UNSPECIFIED REACTION   . Ace Inhibitors Other (See Comments)    Dizziness, drunk like    BP (!) 147/80 (BP Location: Left Arm, Patient Position: Sitting, Cuff Size: Normal)   Pulse 97   Temp (!) 97.1 F (36.2 C) (Temporal)   Ht '5\' 5"'$  (1.651 m)   Wt 179 lb 9.6 oz (81.5 kg)   SpO2 99%   BMI 29.89 kg/m   BP Readings from Last 3 Encounters:  03/07/20 (!) 147/80  02/14/20 (!) 144/69  11/15/19 (!) 161/58   Pulse Readings from Last 3 Encounters:  03/07/20 97  02/14/20 96  11/15/19 100   Wt Readings from Last 3 Encounters:  03/07/20 179 lb 9.6 oz (81.5 kg)  02/14/20 181 lb 4.8 oz (82.2 kg)  11/15/19 180 lb 3.2 oz (81.7 kg)     Physical Exam Constitutional:      General: She is not in acute distress.    Appearance: She is not ill-appearing or toxic-appearing.  Neck:     Thyroid: No thyroid mass, thyromegaly or thyroid tenderness.  Cardiovascular:     Rate and Rhythm: Normal rate and regular rhythm.     Pulses: Normal pulses.  Pulmonary:     Effort: Pulmonary effort is normal. No respiratory distress.     Breath sounds: No wheezing or rhonchi.  Musculoskeletal:     Right lower leg: No edema.     Left lower leg: No edema.  Skin:    General: Skin is warm and dry.  Neurological:     Mental Status: She is alert and oriented to person, place, and time.  Psychiatric:        Mood and Affect: Mood normal.        Behavior: Behavior normal.      A/P:  1. Type 2 diabetes mellitus  without complication, without long-term current use of insulin (HCC) - uncontrolled  - Hemoglobin A1c - dapagliflozin propanediol (FARXIGA) 5 MG TABS tablet; Take 1 tablet (5 mg total) by mouth daily before breakfast.  Dispense: 90 tablet; Refill: 3 - may need to increase to '10mg'$  daily depending on A1C result - glipiZIDE (GLUCOTROL) 10 MG tablet; Take one tablet by mouth two times daily  Dispense: 180  tablet; Refill: 3 - encouraged pt to follow low carb, low sugar diet  - encouraged pt to check BS at home - ideally fasting in AM and 2hr after lunch or dinner - f/u in 3 mo  2. Hyperlipidemia, unspecified hyperlipidemia type - at goal Refill: - simvastatin (ZOCOR) 10 MG tablet; Take 1 tablet (10 mg total) by mouth at bedtime.  Dispense: 90 tablet; Refill: 3  3. Essential hypertension - SBP slightly elevated and this is consistent. Pt states higher dose of losartan made her "feel drunk". She states she was on labetalol '100mg'$  BID in the past and is not sure when/why it was decreased to '50mg'$  BID.  - labetalol (NORMODYNE) 100 MG tablet; Take 0.5 tablets (50 mg total) by mouth 2 (two) times daily.  Dispense: 90 tablet; Refill: 3 - losartan (COZAAR) 25 MG tablet; Take 1 tablet (25 mg total) by mouth daily.  Dispense: 90 tablet; Refill: 3  4. Hypothyroidism, unspecified type - pt had been non-complaint with taking levothyroxine previously but states she has been taking it since 01/2020 - TSH - T4, free - levothyroxine (EUTHYROX) 100 MCG tablet; TAKE 1 TABLET BY MOUTH ONCE DAILY BEFORE BREAKFAST  Dispense: 90 tablet; Refill: 3  5. Gastroesophageal reflux disease, unspecified whether esophagitis present - stable, controlled Refill: - dexlansoprazole (DEXILANT) 60 MG capsule; Take 1qam ac  Dispense: 90 capsule; Refill: 3    This visit occurred during the SARS-CoV-2 public health emergency.  Safety protocols were in place, including screening questions prior to the visit, additional usage of staff PPE, and extensive cleaning of exam room while observing appropriate contact time as indicated for disinfecting solutions.

## 2020-03-11 ENCOUNTER — Other Ambulatory Visit: Payer: Self-pay | Admitting: Family Medicine

## 2020-03-11 DIAGNOSIS — Z1231 Encounter for screening mammogram for malignant neoplasm of breast: Secondary | ICD-10-CM

## 2020-03-12 ENCOUNTER — Encounter: Payer: Self-pay | Admitting: Family Medicine

## 2020-03-12 ENCOUNTER — Telehealth (INDEPENDENT_AMBULATORY_CARE_PROVIDER_SITE_OTHER): Payer: Medicare Other | Admitting: Family Medicine

## 2020-03-12 VITALS — Ht 65.0 in

## 2020-03-12 DIAGNOSIS — E1165 Type 2 diabetes mellitus with hyperglycemia: Secondary | ICD-10-CM | POA: Diagnosis not present

## 2020-03-12 MED ORDER — DAPAGLIFLOZIN PROPANEDIOL 10 MG PO TABS: 10.0000 mg | ORAL_TABLET | Freq: Every day | ORAL | 3 refills | Status: DC

## 2020-03-12 NOTE — Progress Notes (Signed)
Virtual Visit via Telephone Note  I connected with Jenna [name] on 03/12/20 at  3:30 PM EDT by telephone and verified that I am speaking with the correct person using two identifiers.   I discussed the limitations, risks, security and privacy concerns of performing an evaluation and management service by telephone and the availability of in person appointments. I also discussed with the patient that there may be a patient responsible charge related to this service. The patient expressed understanding and agreed to proceed.  Location patient: home Location provider: work  Participants present for the call: patient, provider Patient did not have a visit in the prior 7 days to address this/these issue(s).  Chief Complaint  Patient presents with  . Diabetes    Medication discussion     History of Present Illness: Jenna [name] is a 74 y.o. female scheduled today to discuss her recent A1C result of 11.5. Prior to that is was last checked in 05/2019 and was 9.8. Her current DM medication regimen includes glipizide '10mg'$  BID, farxiga '5mg'$  daily and she is taking this consistently.  She does not check her BS at home often/consistently.  She states she has a "healthy appetite" and eats a lot of fruit - strawberries, cherries, peaches, tangerines - and yogurt.  She said she ate a few ice cream sandwiches last week.  She denies eating many carbs or sweets.    Lab Results  Component Value Date   HGBA1C 11.5 Repeated and verified X2. (H) 03/07/2020     Past Medical History:  Diagnosis Date  . Blood transfusion without reported diagnosis   . Cataract   . DM (diabetes mellitus) ([place])   . GERD (gastroesophageal reflux disease)   . Hyperlipidemia   . Hypertension   . Iron deficiency anemia   . NSCL ca dx'd 03/2018  . Thyroid disease     Past Surgical History:  Procedure Laterality Date  . CATARACT EXTRACTION Right   . CHOLECYSTECTOMY    . COLONOSCOPY  10/24/2009   normal  rectum/1X1cm abnormal lesion in the ascending colon (bx benign). TI normal for 10cm.  Prep difficult/inadequate. f/u TCS 09/2012 recommended  . COLONOSCOPY  10/13/2004   Normal rectum/Diminutive polyps, splenic flexure, cold biopsied/removed.  Remainder of colonic mucosa appeared normal.  . COLONOSCOPY N/A 12/14/2012   WJX:[id number] polyp-tubular adenoma  . ESOPHAGOGASTRODUODENOSCOPY  10/13/2004    Normal esophagus/ Nodular volcano like lesion in the antrum, either representing a  pancreatic rest or leiomyoma, biopsied.  Remainder of the gastric mucosa appeared normal, normal D1-D2  . ESOPHAGOGASTRODUODENOSCOPY  10/24/2009   Benign biopsies. normal esophagus/small hiatal hernia/nodular lesion antrum/distal greater curvature. duodenal AVM s/p ablation  . GIVENS CAPSULE STUDY  07/27/2010    multiple arteriovenous malformations which could definitely be the contributor to her drifting hemoglobin and hematocrit  . TUBAL LIGATION    . VIDEO BRONCHOSCOPY WITH ENDOBRONCHIAL ULTRASOUND N/A 04/27/2018   Procedure: VIDEO BRONCHOSCOPY WITH ENDOBRONCHIAL ULTRASOUND;  Surgeon: Melrose Nakayama, MD;  Location: [facility] OR;  Service: Thoracic;  Laterality: N/A;    Social History   Tobacco Use  . Smoking status: Former Smoker    Packs/day: 0.50    Years: 55.00    Pack years: 27.50    Types: Cigarettes    Quit date: 03/18/2018    Years since quitting: 1.9  . Smokeless tobacco: Never Used  . Tobacco comment: smoked off and n  Vaping Use  . Vaping Use: Never used  Substance Use Topics  .  Alcohol use: No  . Drug use: No    Family History  Problem Relation Age of Onset  . Colon cancer Maternal Aunt        greater than age 79  . Breast cancer Cousin   . Amblyopia Neg Hx   . Blindness Neg Hx   . Cataracts Neg Hx   . Diabetes Neg Hx   . Glaucoma Neg Hx   . Macular degeneration Neg Hx   . Retinal detachment Neg Hx   . Strabismus Neg Hx   . Retinitis pigmentosa Neg Hx   . Rectal cancer Neg Hx   .  Stomach cancer Neg Hx   . Colon polyps Neg Hx   . Esophageal cancer Neg Hx     Outpatient Encounter Medications as of 03/12/2020  Medication Sig  . blood glucose meter kit and supplies KIT Use up to four times daily as directed  . dapagliflozin propanediol (FARXIGA) 5 MG TABS tablet Take 1 tablet (5 mg total) by mouth daily before breakfast.  . dexlansoprazole (DEXILANT) 60 MG capsule Take 1qam ac  . diphenhydrAMINE (BENADRYL) 2 % cream Apply 1 application topically as needed for itching.  . docusate sodium (COLACE) 100 MG capsule Take 100 mg by mouth daily as needed for mild constipation.  . Ferrous Sulfate (IRON) 325 (65 Fe) MG TABS TAKE 1 TABLET BY MOUTH TWICE DAILY WITH A MEAL  . glipiZIDE (GLUCOTROL) 10 MG tablet Take one tablet by mouth two times daily  . labetalol (NORMODYNE) 100 MG tablet Take 0.5 tablets (50 mg total) by mouth 2 (two) times daily.  [name] levothyroxine (EUTHYROX) 100 MCG tablet TAKE 1 TABLET BY MOUTH ONCE DAILY BEFORE BREAKFAST  . losartan (COZAAR) 25 MG tablet Take 1 tablet (25 mg total) by mouth daily.  . methylPREDNISolone (MEDROL DOSEPAK) 4 MG TBPK tablet Use as instructed.  . Multiple Vitamin (MULTIVITAMIN WITH MINERALS) TABS tablet Take 1 tablet by mouth at bedtime.   . mupirocin ointment (BACTROBAN) 2 % Apply into wound twice daily.  . simvastatin (ZOCOR) 10 MG tablet Take 1 tablet (10 mg total) by mouth at bedtime.   Facility-Administered Encounter Medications as of 03/12/2020  Medication  . promethazine (PHENERGAN) injection 12.5 mg  . Tbo-Filgrastim (GRANIX) injection 300 mcg     Allergies  Allergen Reactions  . Paclitaxel Other (See Comments)    Unresponsiveness shortly after Taxol inf started 06/12/18.  . Aspirin Other (See Comments)    Stomach bleeding   . Esomeprazole Magnesium     UNSPECIFIED REACTION   . Ace Inhibitors Other (See Comments)    Dizziness, drunk like      ROS: See pertinent positives and negatives per HPI.    Observations/Objective: Patient sounds cheerful and well on the phone. I do not appreciate any SOB. Speech and thought processing are grossly intact. Patient reported vitals:   Assessment and Plan: 1. Uncontrolled type 2 diabetes mellitus with hyperglycemia (HCC) - cont glipizide '10mg'$  BID Increase: - dapagliflozin propanediol (FARXIGA) 10 MG TABS tablet; Take 1 tablet (10 mg total) by mouth daily before breakfast.  Dispense: 90 tablet; Refill: 3 - increased from '5mg'$  to '10mg'$  - Ambulatory referral to diabetic education - advised pt to check BS fasting in AM and 2hrs after lunch or dinner and keep a log of BS readings - f/u in 3 mo or sooner PRN   I did not refer this patient for an OV in the next 24 hours for this/these issue(s).  I discussed the  assessment and treatment plan with the patient. The patient was provided an opportunity to ask questions and all were answered. The patient agreed with the plan and demonstrated an understanding of the instructions.   The patient was advised to call back or seek an in-person evaluation if the symptoms worsen or if the condition fails to improve as anticipated.  I provided 15 minutes of non-face-to-face time during this encounter.   Letta Median, DO

## 2020-03-17 ENCOUNTER — Telehealth: Payer: Self-pay | Admitting: Gastroenterology

## 2020-03-17 NOTE — Telephone Encounter (Signed)
Pt is requesting a call back from a nurse to discuss a possible surgery regarding her intestine.

## 2020-03-17 NOTE — Telephone Encounter (Signed)
Patient was seen in 2020 and potentially need AVM ablation in colon. Patient will come in to discuss 05/28/20.

## 2020-03-21 ENCOUNTER — Ambulatory Visit
Admission: RE | Admit: 2020-03-21 | Discharge: 2020-03-21 | Disposition: A | Discharge status: Home / Self Care | Payer: Medicare Other | Source: Ambulatory Visit | Attending: Family Medicine | Admitting: Family Medicine

## 2020-03-21 ENCOUNTER — Other Ambulatory Visit: Payer: Self-pay

## 2020-03-21 DIAGNOSIS — Z1231 Encounter for screening mammogram for malignant neoplasm of breast: Secondary | ICD-10-CM

## 2020-04-14 ENCOUNTER — Encounter: Payer: Medicare Other | Attending: Family Medicine | Admitting: Dietician

## 2020-04-14 ENCOUNTER — Other Ambulatory Visit: Payer: Self-pay

## 2020-04-14 ENCOUNTER — Encounter: Payer: Self-pay | Admitting: Dietician

## 2020-04-14 DIAGNOSIS — E119 Type 2 diabetes mellitus without complications: Secondary | ICD-10-CM | POA: Insufficient documentation

## 2020-04-14 NOTE — Progress Notes (Signed)
Diabetes Self-Management Education  Visit Type: First/Initial  Appt. Start Time: 2:15pm   Appt. End Time: 3:15pm  04/14/2020  [name], identified by name and date of birth, is a 74 y.o. female with a diagnosis of Diabetes: Type 2.   ASSESSMENT Patient and a friend were present for today's visit. Patient states her A1c is very high and she believes it is related to her diet as well as recent hx of taking steroid medications for cancer treatments. 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 Typical meal pattern is 3 meals per day. Lunch is small, usually snack size, and dinner is a large meal. States she drinks lots of water. Likes popcorn, all kinds of vegetables, yogurt, fruit, and vanilla wafers.    Diabetes Self-Management Education - 04/14/20 1415      Visit Information   Visit Type First/Initial      Initial Visit   Diabetes Type Type 2    Are you currently following a meal plan? No    Are you taking your medications as prescribed? Yes    Date Diagnosed 2000      Health Coping   How would you rate your overall health? Good      Psychosocial Assessment   Patient Belief/Attitude about Diabetes Defeat/Burnout    Self-care barriers None    Self-management support Doctor's office;Family;Friends    Other persons present Friend    Patient Concerns Nutrition/Meal planning;Glycemic Control    Special Needs None    Preferred Learning Style No preference indicated    Learning Readiness Ready    How often do you need to have someone help you when you read instructions, pamphlets, or other written materials from your doctor or pharmacy? 1 - Never    What is the last grade level you completed in school? 64WO      Complications   Last HgB A1C per patient/outside source 11.5 %   03/07/20   How often do you check your blood sugar? 0 times/day (not testing)   has Accu-Chek meter, doesn't know how to use or how often to check   Have you had a dilated eye exam in the past 12 months? No     Have you had a dental exam in the past 12 months? No    Are you checking your feet? Yes    How many days per week are you checking your feet? 7      Dietary Intake   Breakfast Raisin Bran cereal + Lactaid milk   or Cheerios + strawberries + Splenda + Lactaid milk   Lunch nabs crackers   or popcorn   Snack (afternoon) freeze pops    Dinner beef roast + potatoes + rice + carrots + collard greens + mac & cheese + cornbread    Beverage(s) water, sugar free flavored water, ginger ale      Exercise   Exercise Type Light (walking / raking leaves)    How many days per week to you exercise? 6    How many minutes per day do you exercise? 30    Total minutes per week of exercise 180      Patient Education   Previous Diabetes Education Yes (please comment)   when diagnosed in 2000   Disease state  Definition of diabetes, type 1 and 2, and the diagnosis of diabetes;Explored patient's options for treatment of their diabetes;Factors that contribute to the development of diabetes    Nutrition management  Role of diet in the  treatment of diabetes and the relationship between the three main macronutrients and blood glucose level;Food label reading, portion sizes and measuring food.;Reviewed blood glucose goals for pre and post meals and how to evaluate the patients' food intake on their blood glucose level.;Effects of alcohol on blood glucose and safety factors with consumption of alcohol.;Information on hints to eating out and maintain blood glucose control.;Meal options for control of blood glucose level and chronic complications.    Physical activity and exercise  Role of exercise on diabetes management, blood pressure control and cardiac health.    Monitoring Taught/evaluated SMBG meter.;Purpose and frequency of SMBG.;Taught/discussed recording of test results and interpretation of SMBG.;Identified appropriate SMBG and/or A1C goals.;Daily foot exams;Yearly dilated eye exam    Acute complications Discussed  and identified patients' treatment of hyperglycemia.;Taught treatment of hypoglycemia - the 15 rule.    Chronic complications Relationship between chronic complications and blood glucose control;Assessed and discussed foot care and prevention of foot problems;Retinopathy and reason for yearly dilated eye exams    Psychosocial adjustment Worked with patient to identify barriers to care and solutions;Role of stress on diabetes;Helped patient identify a support system for diabetes management      Individualized Goals (developed by patient)   Nutrition General guidelines for healthy choices and portions discussed    Monitoring  test my blood glucose as discussed   goals for fasting and 2-hrs after meals     Outcomes   Expected Outcomes Demonstrated interest in learning. Expect positive outcomes    Future DMSE PRN           Individualized Plan for Diabetes Self-Management Training:  Learning Objective:  Patient will have a greater understanding of diabetes self-management. Patient education plan is to attend individual and/or group sessions per assessed needs and concerns.  Expected Outcomes:  Demonstrated interest in learning. Expect positive outcomes  Education material provided: ADA - How to Thrive: A Guide for Your Journey with Diabetes, Meal Ideas, Breakfast Ideas  If problems or questions, patient to contact team via:  Phone and Email  Future DSME appointment: PRN

## 2020-05-28 ENCOUNTER — Encounter: Payer: Self-pay | Admitting: Gastroenterology

## 2020-05-28 ENCOUNTER — Ambulatory Visit (INDEPENDENT_AMBULATORY_CARE_PROVIDER_SITE_OTHER): Payer: Medicare Other | Admitting: Gastroenterology

## 2020-05-28 ENCOUNTER — Other Ambulatory Visit (INDEPENDENT_AMBULATORY_CARE_PROVIDER_SITE_OTHER): Payer: Medicare Other

## 2020-05-28 VITALS — BP 150/62 | HR 94 | Ht 65.0 in | Wt 181.0 lb

## 2020-05-28 DIAGNOSIS — K219 Gastro-esophageal reflux disease without esophagitis: Secondary | ICD-10-CM

## 2020-05-28 DIAGNOSIS — D509 Iron deficiency anemia, unspecified: Secondary | ICD-10-CM | POA: Diagnosis not present

## 2020-05-28 DIAGNOSIS — R0600 Dyspnea, unspecified: Secondary | ICD-10-CM

## 2020-05-28 DIAGNOSIS — Z86010 Personal history of colonic polyps: Secondary | ICD-10-CM

## 2020-05-28 DIAGNOSIS — K5520 Angiodysplasia of colon without hemorrhage: Secondary | ICD-10-CM

## 2020-05-28 LAB — CBC WITH DIFFERENTIAL/PLATELET
Basophils Absolute: 0 10*3/uL (ref 0.0–0.1)
Basophils Relative: 0.8 % (ref 0.0–3.0)
Eosinophils Absolute: 0.1 10*3/uL (ref 0.0–0.7)
Eosinophils Relative: 1.3 % (ref 0.0–5.0)
HCT: 33.6 % — ABNORMAL LOW (ref 36.0–46.0)
Hemoglobin: 11 g/dL — ABNORMAL LOW (ref 12.0–15.0)
Lymphocytes Relative: 8.2 % — ABNORMAL LOW (ref 12.0–46.0)
Lymphs Abs: 0.4 10*3/uL — ABNORMAL LOW (ref 0.7–4.0)
MCHC: 32.7 g/dL (ref 30.0–36.0)
MCV: 84.8 fl (ref 78.0–100.0)
Monocytes Absolute: 0.3 10*3/uL (ref 0.1–1.0)
Monocytes Relative: 5.3 % (ref 3.0–12.0)
Neutro Abs: 4.1 10*3/uL (ref 1.4–7.7)
Neutrophils Relative %: 84.4 % — ABNORMAL HIGH (ref 43.0–77.0)
Platelets: 212 10*3/uL (ref 150.0–400.0)
RBC: 3.96 Mil/uL (ref 3.87–5.11)
RDW: 14.9 % (ref 11.5–15.5)
WBC: 4.9 10*3/uL (ref 4.0–10.5)

## 2020-05-28 LAB — IBC + FERRITIN
Ferritin: 74.2 ng/mL (ref 10.0–291.0)
Iron: 50 ug/dL (ref 42–145)
Saturation Ratios: 16.1 % — ABNORMAL LOW (ref 20.0–50.0)
Transferrin: 222 mg/dL (ref 212.0–360.0)

## 2020-05-28 MED ORDER — DEXLANSOPRAZOLE 30 MG PO CPDR: 30.0000 mg | DELAYED_RELEASE_CAPSULE | Freq: Every day | ORAL | 3 refills | Status: DC

## 2020-05-28 NOTE — Patient Instructions (Addendum)
If you are age 74 or older, your body mass index should be between 23-30. Your Body mass index is 30.12 kg/m. If this is out of the aforementioned range listed, please consider follow up with your Primary Care Provider.  If you are age 24 or younger, your body mass index should be between 19-25. Your Body mass index is 30.12 kg/m. If this is out of the aformentioned range listed, please consider follow up with your Primary Care Provider.   Decrease the Dexilant to 30 mg 1 a day from 60 mg a day.  Your provider has requested that you go to the basement level for lab work before leaving today. Press "B" on the elevator. The lab is located at the first door on the left as you exit the elevator.  Due to recent changes in healthcare laws, you may see the results of your imaging and laboratory studies on MyChart before your provider has had a chance to review them.  We understand that in some cases there may be results that are confusing or concerning to you. Not all laboratory results come back in the same time frame and the provider may be waiting for multiple results in order to interpret others.  Please give [place] 48 hours in order for your provider to thoroughly review all the results before contacting the office for clarification of your results.   It was a pleasure to see you today!  Dr. Havery Moros

## 2020-05-28 NOTE — Progress Notes (Signed)
HPI :  74 year old female with a history of lung cancer status post therapy with chemo radiation and immunotherapy, history of iron deficiency anemia and small bowel/colonic AVMs, here for a follow-up visit.  Patient inquires as to whether or not she needs to have her bowel AVMs ablated or not. She has had a history of iron deficiency anemia with occult positive stools in March 2020, she had a hemoglobin of 8.1. She underwent EGD and colonoscopy with me in May 2020. She had diminutive AVMs in the small bowel noted. She also had a large cecal AVM noted, with a few adenomas in her colon as well that were removed. AVMs were not treated during the procedure given the case was done in the office and not the hospital, do not have access to APC.  She takes oral iron and her hemoglobin has ranged from 10-11 since November 2020. She tolerates the iron well, makes her stool dark. She does not see any obvious blood in her stool. She denies any abdominal pains. She does have a history of reflux. Her EGD did not show any Barrett's esophagus. She has been taking Dexilant 60 mg a day which works really well to control her heartburn and reflux. She inquires about long-term plan. She does not think that she has been on lower dose PPI in a while. She is happy with the [place].  She otherwise reports she has had some exertional dyspnea ongoing for a few months now. She cannot walk too far due to her dyspnea. She is not had an echocardiogram or seen a cardiologist recently from what I can gather. She has repeat CT scan of her chest pending for the end of this month. She denies any weight loss. She denies any chest pains.  Prior workup: Colonoscopy 12/14/2012 - Dr. Gala Romney - 17mm splenic flexure polyp, otherwise normal exam  EGD and colonoscopy 10/2009 - paper report, not interpretable - report of a duodenal AVM that was ablated  EGD 12/19/18 - - The exam of the esophagus was otherwise normal. - A single 6 mm papule  (nodule) was found in the gastric antrum, appeared to be umbilicated and subepithelial, suspicious for benign pancreatic rest. Biopsies were taken with a cold forceps for histology. - Patchy mildly erythematous mucosa was found in the gastric body and in the gastric antrum. Biopsies were taken with a cold forceps for Helicobacter pylori testing. - The exam of the stomach was otherwise normal. - A few small angiodysplastic lesions were found in the duodenal bulb and in the second portion of the duodenum (3 total - 1 bulb, 2 in 2nd portion - all small). - The exam of the duodenum was otherwise normal.   Colonoscopy 12/19/18 - The perianal and digital rectal examinations were normal. - The terminal ileum appeared normal. - A single large angiodysplastic lesion was found in the cecum. - Three sessile polyps were found in the transverse colon. The polyps were 4 to 5 mm in size. These polyps were removed with a cold snare. Resection and retrieval were complete. - A few small-mouthed diverticula were found in the sigmoid colon. - The exam was otherwise without abnormality.  1. Surgical [P], stomach, antral - BENIGN POLYPOID GASTRIC TYPE MUCOSA WITH MILD CHRONIC INFLAMMATION. - THERE IS NO EVIDENCE OF DYSPLASIA OR MALIGNANCY. 2. Surgical [P], gastric antrum and gastric body - CHRONIC INACTIVE GASTRITIS, MILD. - THERE IS NO EVIDENCE OF HELICOBACTER PYLORI, DYSPLASIA, OR MALIGNANCY. - SEE COMMENT. 3. Surgical [P], colon, transverse, polyp (  3) - TUBULAR ADENOMA(S). - HIGH GRADE DYSPLASIA IS NOT IDENTIFIED.   CT scan chest 02/12/20 - IMPRESSION: 1. Continued interval development of post treatment consolidation and fibrosis of the perihilar right lung, with essentially total fibrosis and volume loss of the right middle lobe. 2. Slight interval decrease in size of a right paraesophageal lymph node. Unchanged prominent pretracheal lymph nodes. 3. Overall findings above are consistent with  continued treatment response. 4. Unchanged small right pleural effusion. 5. Emphysema (ICD10-J43.9). 6. Coronary artery disease.  Aortic Atherosclerosis (ICD10-I70.0).      Past Medical History:  Diagnosis Date  . Blood transfusion without reported diagnosis   . Cataract   . DM (diabetes mellitus) ([place])   . GERD (gastroesophageal reflux disease)   . Hyperlipidemia   . Hypertension   . Iron deficiency anemia   . NSCL ca dx'd 03/2018  . Thyroid disease      Past Surgical History:  Procedure Laterality Date  . CATARACT EXTRACTION Right   . CHOLECYSTECTOMY    . COLONOSCOPY  10/24/2009   normal rectum/1X1cm abnormal lesion in the ascending colon (bx benign). TI normal for 10cm.  Prep difficult/inadequate. f/u TCS 09/2012 recommended  . COLONOSCOPY  10/13/2004   Normal rectum/Diminutive polyps, splenic flexure, cold biopsied/removed.  Remainder of colonic mucosa appeared normal.  . COLONOSCOPY N/A 12/14/2012   QQP:[id number] polyp-tubular adenoma  . ESOPHAGOGASTRODUODENOSCOPY  10/13/2004    Normal esophagus/ Nodular volcano like lesion in the antrum, either representing a  pancreatic rest or leiomyoma, biopsied.  Remainder of the gastric mucosa appeared normal, normal D1-D2  . ESOPHAGOGASTRODUODENOSCOPY  10/24/2009   Benign biopsies. normal esophagus/small hiatal hernia/nodular lesion antrum/distal greater curvature. duodenal AVM s/p ablation  . GIVENS CAPSULE STUDY  07/27/2010    multiple arteriovenous malformations which could definitely be the contributor to her drifting hemoglobin and hematocrit  . TUBAL LIGATION    . VIDEO BRONCHOSCOPY WITH ENDOBRONCHIAL ULTRASOUND N/A 04/27/2018   Procedure: VIDEO BRONCHOSCOPY WITH ENDOBRONCHIAL ULTRASOUND;  Surgeon: Melrose Nakayama, MD;  Location: [facility] OR;  Service: Thoracic;  Laterality: N/A;   Family History  Problem Relation Age of Onset  . Colon cancer Maternal Aunt        greater than age 13  . Breast cancer Cousin   . Amblyopia Neg  Hx   . Blindness Neg Hx   . Cataracts Neg Hx   . Diabetes Neg Hx   . Glaucoma Neg Hx   . Macular degeneration Neg Hx   . Retinal detachment Neg Hx   . Strabismus Neg Hx   . Retinitis pigmentosa Neg Hx   . Rectal cancer Neg Hx   . Stomach cancer Neg Hx   . Colon polyps Neg Hx   . Esophageal cancer Neg Hx    Social History   Tobacco Use  . Smoking status: Former Smoker    Packs/day: 0.50    Years: 55.00    Pack years: 27.50    Types: Cigarettes    Quit date: 03/18/2018    Years since quitting: 2.1  . Smokeless tobacco: Never Used  . Tobacco comment: smoked off and n  Vaping Use  . Vaping Use: Never used  Substance Use Topics  . Alcohol use: No  . Drug use: No   Current Outpatient Medications  Medication Sig Dispense Refill  . blood glucose meter kit and supplies KIT Use up to four times daily as directed 1 each 0  . dapagliflozin propanediol (FARXIGA) 10 MG TABS tablet Take 1  tablet (10 mg total) by mouth daily before breakfast. 90 tablet 3  . dexlansoprazole (DEXILANT) 60 MG capsule Take 1qam ac 90 capsule 3  . diphenhydrAMINE (BENADRYL) 2 % cream Apply 1 application topically as needed for itching.    . docusate sodium (COLACE) 100 MG capsule Take 100 mg by mouth daily as needed for mild constipation.    . Ferrous Sulfate (IRON) 325 (65 Fe) MG TABS TAKE 1 TABLET BY MOUTH TWICE DAILY WITH A MEAL 180 tablet 0  . glipiZIDE (GLUCOTROL) 10 MG tablet Take one tablet by mouth two times daily 180 tablet 3  . labetalol (NORMODYNE) 100 MG tablet Take 0.5 tablets (50 mg total) by mouth 2 (two) times daily. 90 tablet 3  . levothyroxine (EUTHYROX) 100 MCG tablet TAKE 1 TABLET BY MOUTH ONCE DAILY BEFORE BREAKFAST 90 tablet 3  . losartan (COZAAR) 25 MG tablet Take 1 tablet (25 mg total) by mouth daily. 90 tablet 3  . Multiple Vitamin (MULTIVITAMIN WITH MINERALS) TABS tablet Take 1 tablet by mouth at bedtime.     . simvastatin (ZOCOR) 10 MG tablet Take 1 tablet (10 mg total) by mouth at  bedtime. 90 tablet 3   No current facility-administered medications for this visit.   Facility-Administered Medications Ordered in Other Visits  Medication Dose Route Frequency Provider Last Rate Last Admin  . promethazine (PHENERGAN) injection 12.5 mg  12.5 mg Intravenous Once Harle Stanford., PA-C      . Tbo-Filgrastim (GRANIX) injection 300 mcg  300 mcg Subcutaneous Once Curt Bears, MD       Allergies  Allergen Reactions  . Paclitaxel Other (See Comments)    Unresponsiveness shortly after Taxol inf started 06/12/18.  . Aspirin Other (See Comments)    Stomach bleeding   . Esomeprazole Magnesium     UNSPECIFIED REACTION   . Ace Inhibitors Other (See Comments)    Dizziness, drunk like     Review of Systems: All systems reviewed and negative except where noted in HPI.     Physical Exam: BP (!) 150/62   Pulse 94   Ht $R'5\' 5"'UA$  (1.651 m)   Wt 181 lb (82.1 kg)   BMI 30.12 kg/m  Constitutional: Pleasant,well-developed, female in no acute distress. HEENT: Normocephalic and atraumatic.  Neck supple.  Cardiovascular: Normal rate, regular rhythm.  Pulmonary/chest: Effort normal and breath sounds normal. Abdominal: Soft, nondistended, nontender.  There are no masses palpable.  Extremities: no edema Lymphadenopathy: No cervical adenopathy noted. Neurological: Alert and oriented to person place and time. Skin: Skin is warm and dry. No rashes noted. Psychiatric: Normal mood and affect. Behavior is normal.   ASSESSMENT AND PLAN: 74 year old female here for reassessment of the following:  History of AVMs / IDA - with iron supplementation her anemia has been managed and her hemoglobin has been stable. Discussed her AVMs, doubt her small bowel AVMs at least endoscopically when seen last year or causing much of a problem. Suspect the larger AVM lesion in the cecum could be related. As long as her hemoglobin stays stable I don't feel strongly that we need to pursue endoscopic  ablation, given size of cecal lesion there is risks of procedural bleeding with ablation. I will recheck her hemoglobin and iron studies today. If those are relatively stable then she agreed we will hold off on ablation of the lesions right now and she will continue iron. If she cannot maintain her hemoglobin, has overt bleeding etc., we will then proceed with ablation at the  hospital. She was in agreement with the plan, all questions answered, avoid NSAIDs moving forward.  History of colon polyps - due for surveillance colonoscopy in between 2023 and 2025 given 3 polyps on her last colonoscopy.  GERD - taking high-dose Dexilant longstanding for reflux. I discussed long-term risk benefits of that with her. Recommend using the lowest dose needed to control her symptoms. We will initially transition her from 60 mg of Dexilant per day to 30 mg and see how she does. If she can have control of symptoms with lower dose of medication that would be preferable. She has no history of Barrett's esophagus. She will try this for a month and if continues to do well we will continue try to taper her down or switch her to a less expensive regimen.  Dyspnea - endorses some progressive exertional dyspnea in recent months. No chest pains. CT scan does show some volume loss in her right mid lobe unclear if that is related from treatment of lung cancer. I told her I do not think her level of anemia is causing her dyspnea. Will await her repeat CT scan of her chest but I think may be reasonable to have her see cardiology for possible echo, I will touch base with her primary care about this. She agreed.  [place] Cellar, MD [facility]

## 2020-05-29 ENCOUNTER — Other Ambulatory Visit: Payer: Self-pay | Admitting: Family Medicine

## 2020-05-29 ENCOUNTER — Other Ambulatory Visit: Payer: Self-pay

## 2020-05-29 DIAGNOSIS — R0600 Dyspnea, unspecified: Secondary | ICD-10-CM

## 2020-05-29 DIAGNOSIS — R0609 Other forms of dyspnea: Secondary | ICD-10-CM

## 2020-05-29 DIAGNOSIS — K5520 Angiodysplasia of colon without hemorrhage: Secondary | ICD-10-CM

## 2020-05-29 DIAGNOSIS — D509 Iron deficiency anemia, unspecified: Secondary | ICD-10-CM

## 2020-05-29 NOTE — Progress Notes (Signed)
Patient notified VIA phone that order was placed.  No questions.  Dm/cma

## 2020-05-29 NOTE — Progress Notes (Signed)
Please call pt to let her know Dr. Havery Moros reached out to me regarding her SOB w/ exertion over the past few months. I placed an order for an echo (ultrasound of the heart) to further evaluate this. They will call her to schedule the appt

## 2020-06-10 ENCOUNTER — Other Ambulatory Visit: Payer: Self-pay

## 2020-06-11 ENCOUNTER — Encounter: Payer: Self-pay | Admitting: Family Medicine

## 2020-06-11 ENCOUNTER — Ambulatory Visit (INDEPENDENT_AMBULATORY_CARE_PROVIDER_SITE_OTHER): Payer: Medicare Other | Admitting: Family Medicine

## 2020-06-11 VITALS — BP 120/72 | HR 90 | Temp 97.5°F | Ht 65.0 in | Wt 180.0 lb

## 2020-06-11 DIAGNOSIS — E785 Hyperlipidemia, unspecified: Secondary | ICD-10-CM

## 2020-06-11 DIAGNOSIS — E1165 Type 2 diabetes mellitus with hyperglycemia: Secondary | ICD-10-CM | POA: Diagnosis not present

## 2020-06-11 DIAGNOSIS — I10 Essential (primary) hypertension: Secondary | ICD-10-CM

## 2020-06-11 LAB — MICROALBUMIN / CREATININE URINE RATIO
Creatinine,U: 40.7 mg/dL
Microalb Creat Ratio: 4.6 mg/g (ref 0.0–30.0)
Microalb, Ur: 1.9 mg/dL (ref 0.0–1.9)

## 2020-06-11 LAB — LIPID PANEL
Cholesterol: 201 mg/dL — ABNORMAL HIGH (ref 0–200)
HDL: 46.1 mg/dL (ref >39.00)
LDL Cholesterol: 124 mg/dL — ABNORMAL HIGH (ref 0–99)
NonHDL: 155.1
Total CHOL/HDL Ratio: 4
Triglycerides: 155 mg/dL — ABNORMAL HIGH (ref 0.0–149.0)
VLDL: 31 mg/dL (ref 0.0–40.0)

## 2020-06-11 LAB — HEMOGLOBIN A1C: Hgb A1c MFr Bld: 12.2 % — ABNORMAL HIGH (ref 4.6–6.5)

## 2020-06-11 NOTE — Progress Notes (Signed)
Chief Complaint  Patient presents with  . Follow-up    f/u Diabetes/labs.  not fasting this am. c/o haivng numbness in the RT two fingers all the time.      HPI: *Jenna [name] is a 74 y.o. female here for DM, HTN, HLD follow-up. For her HTN pt is taking losartan 29m daily and labetalol 530mBID. For her HLD, pt is taking simvastatin 1052maily. For her DM, pt is taking glipizide 36m18mD and farxiga 36mg63mly. At last OV in 02/2020, we increased her farxiga from 5mg t23m0mg d33m. She was also referred for diabetic education and nutrition counseling which she attended on 04/14/20.   Pt does not check BS at home. She states she does not like needles.  Hypoglycemia/Hypergylcemic episodes: no  Diet: pt states she loves to eat and is a retired cook ExTraining and development officerse: walks semi-regularly.   She would like to wait on flu shot today as she does not want to get close to her covid booster which is due  Lab Results  Component Value Date   HGBA1C 11.5 Repeated and verified X2. (H) 03/07/2020   Lab Results  Component Value Date   MICROALBUR 37.5 (H) 06/11/2019   Lab Results  Component Value Date   CREATININE 1.40 (H) 02/12/2020   Lab Results  Component Value Date   CHOL 157 06/11/2019   HDL 39.20 06/11/2019   LDLCALC 93 06/11/2019   TRIG 124.0 06/11/2019   CHOLHDL 4 06/11/2019    The 10-year ASCVD risk score (Goff DMikey Bussing, et al., 2013) is: 38.3%   Values used to calculate the score:     Age: 35 year47    Sex: Female     Is Non-Hispanic African American: Yes     Diabetic: Yes     Tobacco smoker: Yes     Systolic Blood Pressure: 120 mmH476   Is BP treated: Yes     HDL Cholesterol: 39.2 mg/dL     Total Cholesterol: 157 mg/dL  Lab Results  Component Value Date   TSH 1.71 03/07/2020     Past Medical History:  Diagnosis Date  . Blood transfusion without reported diagnosis   . Cataract   . DM (diabetes mellitus) ([facility])   [place]ERD (gastroesophageal reflux disease)   .  Hyperlipidemia   . Hypertension   . Iron deficiency anemia   . NSCL ca dx'd 03/2018  . Thyroid disease     Past Surgical History:  Procedure Laterality Date  . CATARACT EXTRACTION Right   . CHOLECYSTECTOMY    . COLONOSCOPY  10/24/2009   normal rectum/1X1cm abnormal lesion in the ascending colon (bx benign). TI normal for 10cm.  Prep difficult/inadequate. f/u TCS 09/2012 recommended  . COLONOSCOPY  10/13/2004   Normal rectum/Diminutive polyps, splenic flexure, cold biopsied/removed.  Remainder of colonic mucosa appeared normal.  . COLONOSCOPY N/A 12/14/2012   RMR:ColLYY:TKPTWSFtubular adenoma  . ESOPHAGOGASTRODUODENOSCOPY  10/13/2004    Normal esophagus/ Nodular volcano like lesion in the antrum, either representing a  pancreatic rest or leiomyoma, biopsied.  Remainder of the gastric mucosa appeared normal, normal D1-D2  . ESOPHAGOGASTRODUODENOSCOPY  10/24/2009   Benign biopsies. normal esophagus/small hiatal hernia/nodular lesion antrum/distal greater curvature. duodenal AVM s/p ablation  . GIVENS CAPSULE STUDY  07/27/2010    multiple arteriovenous malformations which could definitely be the contributor to her drifting hemoglobin and hematocrit  . TUBAL LIGATION    . VIDEO BRONCHOSCOPY WITH ENDOBRONCHIAL ULTRASOUND N/A 04/27/2018  Procedure: VIDEO BRONCHOSCOPY WITH ENDOBRONCHIAL ULTRASOUND;  Surgeon: Melrose Nakayama, MD;  Location[facility] OR;  Service: Thoracic;  Laterality: N/A;    Social History   Socioeconomic History  . Marital status: Single    Spouse name: Not on file  . Number of children: Not on file  . Years of education: Not on file  . Highest education level: Not on file  Occupational History  . Not on file  Tobacco Use  . Smoking status: Former Smoker    Packs/day: 0.50    Years: 55.00    Pack years: 27.50    Types: Cigarettes    Quit date: 03/18/2018    Years since quitting: 2.2  . Smokeless tobacco: Never Used  . Tobacco comment: smoked off and n  Vaping Use    . Vaping Use: Never used  Substance and Sexual Activity  . Alcohol use: No  . Drug use: No  . Sexual activity: Not on file  Other Topics Concern  . Not on file  Social History Narrative   Patient fully vaccinated   Social Determinants of Health   Financial Resource Strain: Low Risk   . Difficulty of Paying Living Expenses: Not very hard  Food Insecurity: No Food Insecurity  . Worried About Charity fundraiser in the Last Year: Never true  . Ran Out of Food in the Last Year: Never true  Transportation Needs: No Transportation Needs  . Lack of Transportation (Medical): No  . Lack of Transportation (Non-Medical): No  Physical Activity:   . Days of Exercise per Week: Not on file  . Minutes of Exercise per Session: Not on file  Stress:   . Feeling of Stress : Not on file  Social Connections:   . Frequency of Communication with Friends and Family: Not on file  . Frequency of Social Gatherings with Friends and Family: Not on file  . Attends Religious Services: Not on file  . Active Member of Clubs or Organizations: Not on file  . Attends Archivist Meetings: Not on file  . Marital Status: Not on file  Intimate Partner Violence:   . Fear of Current or Ex-Partner: Not on file  . Emotionally Abused: Not on file  . Physically Abused: Not on file  . Sexually Abused: Not on file    Family History  Problem Relation Age of Onset  . Colon cancer Maternal Aunt        greater than age 77  . Breast cancer Cousin   . Amblyopia Neg Hx   . Blindness Neg Hx   . Cataracts Neg Hx   . Diabetes Neg Hx   . Glaucoma Neg Hx   . Macular degeneration Neg Hx   . Retinal detachment Neg Hx   . Strabismus Neg Hx   . Retinitis pigmentosa Neg Hx   . Rectal cancer Neg Hx   . Stomach cancer Neg Hx   . Colon polyps Neg Hx   . Esophageal cancer Neg Hx      Immunization History  Administered Date(s) Administered  . Fluad Quad(high Dose 65+) 05/31/2019  . Influenza-Unspecified  04/16/2014  . PFIZER SARS-COV-2 Vaccination 11/22/2019, 12/17/2019  . Pneumococcal Conjugate-13 06/10/2014    Outpatient Encounter Medications as of 06/11/2020  Medication Sig  . blood glucose meter kit and supplies KIT Use up to four times daily as directed  . dapagliflozin propanediol (FARXIGA) 10 MG TABS tablet Take 1 tablet (10 mg total) by mouth daily before breakfast.  .  Dexlansoprazole 30 MG capsule Take 1 capsule (30 mg total) by mouth daily.  . diphenhydrAMINE (BENADRYL) 2 % cream Apply 1 application topically as needed for itching.  . docusate sodium (COLACE) 100 MG capsule Take 100 mg by mouth daily as needed for mild constipation.  . Ferrous Sulfate (IRON) 325 (65 Fe) MG TABS TAKE 1 TABLET BY MOUTH TWICE DAILY WITH A MEAL  . glipiZIDE (GLUCOTROL) 10 MG tablet Take one tablet by mouth two times daily  . labetalol (NORMODYNE) 100 MG tablet Take 0.5 tablets (50 mg total) by mouth 2 (two) times daily.  [name] levothyroxine (EUTHYROX) 100 MCG tablet TAKE 1 TABLET BY MOUTH ONCE DAILY BEFORE BREAKFAST  . losartan (COZAAR) 25 MG tablet Take 1 tablet (25 mg total) by mouth daily.  . Multiple Vitamin (MULTIVITAMIN WITH MINERALS) TABS tablet Take 1 tablet by mouth at bedtime.   . simvastatin (ZOCOR) 10 MG tablet Take 1 tablet (10 mg total) by mouth at bedtime.   Facility-Administered Encounter Medications as of 06/11/2020  Medication  . promethazine (PHENERGAN) injection 12.5 mg  . Tbo-Filgrastim (GRANIX) injection 300 mcg     ROS: Gen: no fever, chills  Eyes: no blurry vision, double vision Resp: no cough, wheeze, SOB; + DOE x months CV: no CP, palpitations, LE edema,  GI: no heartburn, n/v/d/c, abd pain GU: no dysuria, urgency, frequency, hematuria  Neuro: no dizziness, headache, weakness   Allergies  Allergen Reactions  . Paclitaxel Other (See Comments)    Unresponsiveness shortly after Taxol inf started 06/12/18.  . Aspirin Other (See Comments)    Stomach bleeding   .  Esomeprazole Magnesium     UNSPECIFIED REACTION   . Ace Inhibitors Other (See Comments)    Dizziness, drunk like    BP 120/72   Pulse 90   Temp (!) 97.5 F (36.4 C) (Temporal)   Ht $R'5\' 5"'px$  (1.651 m)   Wt 180 lb (81.6 kg)   SpO2 90%   BMI 29.95 kg/m    BP Readings from Last 3 Encounters:  06/11/20 120/72  05/28/20 (!) 150/62  03/07/20 (!) 147/80   Pulse Readings from Last 3 Encounters:  06/11/20 90  05/28/20 94  03/07/20 97   Wt Readings from Last 3 Encounters:  06/11/20 180 lb (81.6 kg)  05/28/20 181 lb (82.1 kg)  03/07/20 179 lb 9.6 oz (81.5 kg)     Physical Exam Constitutional:      General: She is not in acute distress.    Appearance: Normal appearance. She is not ill-appearing.  Cardiovascular:     Rate and Rhythm: Normal rate and regular rhythm.     Pulses: Normal pulses.  Pulmonary:     Effort: Pulmonary effort is normal. No respiratory distress.     Breath sounds: Normal breath sounds. No wheezing, rhonchi or rales.  Musculoskeletal:     Right lower leg: No edema.     Left lower leg: No edema.  Neurological:     Mental Status: She is alert and oriented to person, place, and time.  Psychiatric:        Mood and Affect: Mood normal.        Behavior: Behavior normal.      A/P:  1. Essential hypertension - controlled, at goal - cont losartan $RemoveBefor'25mg'[id number]$  daily, labetalol $RemoveBeforeDE'50mg'SETjnFTSBqlriQe$  BID  2. Uncontrolled type 2 diabetes mellitus with hyperglycemia ([place]) - last A1C = 11.5 in 02/2020.  - on farxiga $RemoveBe'10mg'DjAeMytVF$  daily and glipizide $RemoveBefor'10mg'EqoUaKgxyoWF$  BID. Pt refuses insulin.  -  pt does not check BS at home despite repeated recommendations to do and keep log - unclear how closely pt follows diabetic diet but stressed importance of low carb, low sugar diet - Hemoglobin A1c - Lipid panel - Microalbumin / creatinine urine ratio  3. Hyperlipidemia, unspecified hyperlipidemia type - LDL near goal (91) on simvastatin 72m when last checked 1 year ago - Lipid panel  F/u in 3 mo or sooner  PRN  Pt declines flu vaccine today as she states she plans to get covid booster Friday or Saturday and does not want to get both close together. I explained both can be given but she declines today. She does plan to get flu vaccine at a later date.   This visit occurred during the SARS-CoV-2 public health emergency.  Safety protocols were in place, including screening questions prior to the visit, additional usage of staff PPE, and extensive cleaning of exam room while observing appropriate contact time as indicated for disinfecting solutions.

## 2020-06-12 ENCOUNTER — Other Ambulatory Visit: Payer: Self-pay | Admitting: Family Medicine

## 2020-06-12 DIAGNOSIS — E785 Hyperlipidemia, unspecified: Secondary | ICD-10-CM

## 2020-06-12 MED ORDER — ATORVASTATIN CALCIUM 40 MG PO TABS: 40.0000 mg | ORAL_TABLET | Freq: Every day | ORAL | 3 refills | Status: DC

## 2020-06-13 ENCOUNTER — Encounter (HOSPITAL_COMMUNITY): Payer: Self-pay

## 2020-06-13 ENCOUNTER — Ambulatory Visit (HOSPITAL_COMMUNITY)
Admission: RE | Admit: 2020-06-13 | Discharge: 2020-06-13 | Disposition: A | Discharge status: Home / Self Care | Payer: Medicare Other | Source: Ambulatory Visit | Attending: Internal Medicine | Admitting: Internal Medicine

## 2020-06-13 ENCOUNTER — Other Ambulatory Visit: Payer: Self-pay

## 2020-06-13 ENCOUNTER — Inpatient Hospital Stay: Payer: Medicare Other | Attending: Internal Medicine

## 2020-06-13 DIAGNOSIS — D509 Iron deficiency anemia, unspecified: Secondary | ICD-10-CM | POA: Insufficient documentation

## 2020-06-13 DIAGNOSIS — Z923 Personal history of irradiation: Secondary | ICD-10-CM | POA: Diagnosis not present

## 2020-06-13 DIAGNOSIS — C342 Malignant neoplasm of middle lobe, bronchus or lung: Secondary | ICD-10-CM | POA: Diagnosis not present

## 2020-06-13 DIAGNOSIS — C3490 Malignant neoplasm of unspecified part of unspecified bronchus or lung: Secondary | ICD-10-CM | POA: Diagnosis not present

## 2020-06-13 DIAGNOSIS — E119 Type 2 diabetes mellitus without complications: Secondary | ICD-10-CM | POA: Diagnosis not present

## 2020-06-13 DIAGNOSIS — I10 Essential (primary) hypertension: Secondary | ICD-10-CM | POA: Diagnosis not present

## 2020-06-13 DIAGNOSIS — E079 Disorder of thyroid, unspecified: Secondary | ICD-10-CM | POA: Insufficient documentation

## 2020-06-13 DIAGNOSIS — I2510 Atherosclerotic heart disease of native coronary artery without angina pectoris: Secondary | ICD-10-CM | POA: Diagnosis not present

## 2020-06-13 DIAGNOSIS — Z9221 Personal history of antineoplastic chemotherapy: Secondary | ICD-10-CM | POA: Diagnosis not present

## 2020-06-13 DIAGNOSIS — Z85118 Personal history of other malignant neoplasm of bronchus and lung: Secondary | ICD-10-CM | POA: Diagnosis not present

## 2020-06-13 DIAGNOSIS — J90 Pleural effusion, not elsewhere classified: Secondary | ICD-10-CM | POA: Diagnosis not present

## 2020-06-13 DIAGNOSIS — Z7984 Long term (current) use of oral hypoglycemic drugs: Secondary | ICD-10-CM | POA: Diagnosis not present

## 2020-06-13 DIAGNOSIS — J9811 Atelectasis: Secondary | ICD-10-CM | POA: Diagnosis not present

## 2020-06-13 DIAGNOSIS — Z79899 Other long term (current) drug therapy: Secondary | ICD-10-CM | POA: Insufficient documentation

## 2020-06-13 LAB — CBC WITH DIFFERENTIAL (CANCER CENTER ONLY)
Abs Immature Granulocytes: 0.02 10*3/uL (ref 0.00–0.07)
Basophils Absolute: 0 10*3/uL (ref 0.0–0.1)
Basophils Relative: 0 %
Eosinophils Absolute: 0.1 10*3/uL (ref 0.0–0.5)
Eosinophils Relative: 1 %
HCT: 33.7 % — ABNORMAL LOW (ref 36.0–46.0)
Hemoglobin: 10.6 g/dL — ABNORMAL LOW (ref 12.0–15.0)
Immature Granulocytes: 0 %
Lymphocytes Relative: 9 %
Lymphs Abs: 0.4 10*3/uL — ABNORMAL LOW (ref 0.7–4.0)
MCH: 26.8 pg (ref 26.0–34.0)
MCHC: 31.5 g/dL (ref 30.0–36.0)
MCV: 85.1 fL (ref 80.0–100.0)
Monocytes Absolute: 0.4 10*3/uL (ref 0.1–1.0)
Monocytes Relative: 8 %
Neutro Abs: 3.6 10*3/uL (ref 1.7–7.7)
Neutrophils Relative %: 82 %
Platelet Count: 215 10*3/uL (ref 150–400)
RBC: 3.96 MIL/uL (ref 3.87–5.11)
RDW: 14.1 % (ref 11.5–15.5)
WBC Count: 4.5 10*3/uL (ref 4.0–10.5)
nRBC: 0 % (ref 0.0–0.2)

## 2020-06-13 LAB — CMP (CANCER CENTER ONLY)
ALT: 20 U/L (ref 0–44)
AST: 18 U/L (ref 15–41)
Albumin: 3.7 g/dL (ref 3.5–5.0)
Alkaline Phosphatase: 97 U/L (ref 38–126)
Anion gap: 10 (ref 5–15)
BUN: 33 mg/dL — ABNORMAL HIGH (ref 8–23)
CO2: 22 mmol/L (ref 22–32)
Calcium: 10.1 mg/dL (ref 8.9–10.3)
Chloride: 104 mmol/L (ref 98–111)
Creatinine: 1.46 mg/dL — ABNORMAL HIGH (ref 0.44–1.00)
GFR, Estimated: 38 mL/min — ABNORMAL LOW (ref >60)
Glucose, Bld: 349 mg/dL — ABNORMAL HIGH (ref 70–99)
Potassium: 4.4 mmol/L (ref 3.5–5.1)
Sodium: 136 mmol/L (ref 135–145)
Total Bilirubin: 0.3 mg/dL (ref 0.3–1.2)
Total Protein: 7.6 g/dL (ref 6.5–8.1)

## 2020-06-13 LAB — TSH: TSH: <0.08 u[IU]/mL — ABNORMAL LOW (ref 0.308–3.960)

## 2020-06-16 ENCOUNTER — Other Ambulatory Visit: Payer: Self-pay

## 2020-06-16 ENCOUNTER — Inpatient Hospital Stay: Payer: Medicare Other | Attending: Internal Medicine | Admitting: Internal Medicine

## 2020-06-16 ENCOUNTER — Encounter: Payer: Self-pay | Admitting: Internal Medicine

## 2020-06-16 DIAGNOSIS — C3490 Malignant neoplasm of unspecified part of unspecified bronchus or lung: Secondary | ICD-10-CM | POA: Diagnosis not present

## 2020-06-16 DIAGNOSIS — Z7984 Long term (current) use of oral hypoglycemic drugs: Secondary | ICD-10-CM | POA: Insufficient documentation

## 2020-06-16 DIAGNOSIS — I10 Essential (primary) hypertension: Secondary | ICD-10-CM | POA: Insufficient documentation

## 2020-06-16 DIAGNOSIS — E785 Hyperlipidemia, unspecified: Secondary | ICD-10-CM | POA: Diagnosis not present

## 2020-06-16 DIAGNOSIS — Z9221 Personal history of antineoplastic chemotherapy: Secondary | ICD-10-CM | POA: Insufficient documentation

## 2020-06-16 DIAGNOSIS — E039 Hypothyroidism, unspecified: Secondary | ICD-10-CM | POA: Diagnosis not present

## 2020-06-16 DIAGNOSIS — J449 Chronic obstructive pulmonary disease, unspecified: Secondary | ICD-10-CM | POA: Diagnosis not present

## 2020-06-16 DIAGNOSIS — E119 Type 2 diabetes mellitus without complications: Secondary | ICD-10-CM | POA: Diagnosis not present

## 2020-06-16 DIAGNOSIS — Z923 Personal history of irradiation: Secondary | ICD-10-CM | POA: Diagnosis not present

## 2020-06-16 DIAGNOSIS — D61818 Other pancytopenia: Secondary | ICD-10-CM | POA: Insufficient documentation

## 2020-06-16 DIAGNOSIS — Z79899 Other long term (current) drug therapy: Secondary | ICD-10-CM | POA: Diagnosis not present

## 2020-06-16 DIAGNOSIS — C342 Malignant neoplasm of middle lobe, bronchus or lung: Secondary | ICD-10-CM | POA: Diagnosis not present

## 2020-06-16 NOTE — Progress Notes (Signed)
[place] [phone]   [phone]  OFFICE PROGRESS NOTE  [place] [address]  DIAGNOSIS: Stage IIIB (T3,N3, M0)non-small cell lung cancer, squamous cell carcinoma presented with large right middle lobe lung breast in addition to mediastinal and bilateral hilar lymphadenopathy and suspicious right supraclavicular lymph node diagnosed in August 2019.   PRIOR THERAPY:  1) A course of concurrent chemoradiation with weekly carboplatin for AUC of 2 and paclitaxel 45 MG/M2.  First dose of chemotherapy given on 05/29/2018.  Status post 5 cycles. 2) Consolidation treatment with immunotherapy with Imfinzi 10 mg/KG every 2 weeks.  First dose August 10, 2018.  Status post 26 cycles.  CURRENT THERAPY:  Observation.  INTERVAL HISTORY: Jenna [name] 74 y.o. female returns to the clinic today for 3 months follow-up visit.  The patient is feeling fine today with no concerning complaints except for the baseline shortness of breath secondary to COPD and radiation induced fibrosis.  She denied having any current chest pain, cough or hemoptysis.  She denied having any fever or chills.  She has no nausea, vomiting, diarrhea or constipation.  She denied having any headache or visual changes.  She is here today for evaluation with repeat CT scan of the chest for restaging of her disease.  MEDICAL HISTORY: Past Medical History:  Diagnosis Date  . Blood transfusion without reported diagnosis   . Cataract   . DM (diabetes mellitus) ([place])   . GERD (gastroesophageal reflux disease)   . Hyperlipidemia   . Hypertension   . Iron deficiency anemia   . NSCL ca dx'd 03/2018  . Thyroid disease     ALLERGIES:  is allergic to paclitaxel, aspirin, esomeprazole magnesium, and ace inhibitors.  MEDICATIONS:  Current Outpatient Medications  Medication Sig Dispense Refill  . atorvastatin (LIPITOR) 40 MG tablet Take  1 tablet (40 mg total) by mouth daily. 90 tablet 3  . blood glucose meter kit and supplies KIT Use up to four times daily as directed 1 each 0  . dapagliflozin propanediol (FARXIGA) 10 MG TABS tablet Take 1 tablet (10 mg total) by mouth daily before breakfast. 90 tablet 3  . Dexlansoprazole 30 MG capsule Take 1 capsule (30 mg total) by mouth daily. 30 capsule 3  . diphenhydrAMINE (BENADRYL) 2 % cream Apply 1 application topically as needed for itching.    . docusate sodium (COLACE) 100 MG capsule Take 100 mg by mouth daily as needed for mild constipation.    . Ferrous Sulfate (IRON) 325 (65 Fe) MG TABS TAKE 1 TABLET BY MOUTH TWICE DAILY WITH A MEAL 180 tablet 0  . glipiZIDE (GLUCOTROL) 10 MG tablet Take one tablet by mouth two times daily 180 tablet 3  . labetalol (NORMODYNE) 100 MG tablet Take 0.5 tablets (50 mg total) by mouth 2 (two) times daily. 90 tablet 3  . levothyroxine (EUTHYROX) 100 MCG tablet TAKE 1 TABLET BY MOUTH ONCE DAILY BEFORE BREAKFAST 90 tablet 3  . losartan (COZAAR) 25 MG tablet Take 1 tablet (25 mg total) by mouth daily. 90 tablet 3  . Multiple Vitamin (MULTIVITAMIN WITH MINERALS) TABS tablet Take 1 tablet by mouth at bedtime.      No current facility-administered medications for this visit.   Facility-Administered Medications Ordered in Other Visits  Medication Dose Route Frequency Provider Last Rate Last Admin  . promethazine (PHENERGAN) injection 12.5 mg  12.5 mg Intravenous Once Harle Stanford., PA-C      .  Tbo-Filgrastim (GRANIX) injection 300 mcg  300 mcg Subcutaneous Once Curt Bears, MD        SURGICAL HISTORY:  Past Surgical History:  Procedure Laterality Date  . CATARACT EXTRACTION Right   . CHOLECYSTECTOMY    . COLONOSCOPY  10/24/2009   normal rectum/1X1cm abnormal lesion in the ascending colon (bx benign). TI normal for 10cm.  Prep difficult/inadequate. f/u TCS 09/2012 recommended  . COLONOSCOPY  10/13/2004   Normal rectum/Diminutive polyps, splenic  flexure, cold biopsied/removed.  Remainder of colonic mucosa appeared normal.  . COLONOSCOPY N/A 12/14/2012   [id number] polyp-tubular adenoma  . ESOPHAGOGASTRODUODENOSCOPY  10/13/2004    Normal esophagus/ Nodular volcano like lesion in the antrum, either representing a  pancreatic rest or leiomyoma, biopsied.  Remainder of the gastric mucosa appeared normal, normal D1-D2  . ESOPHAGOGASTRODUODENOSCOPY  10/24/2009   Benign biopsies. normal esophagus/small hiatal hernia/nodular lesion antrum/distal greater curvature. duodenal AVM s/p ablation  . GIVENS CAPSULE STUDY  07/27/2010    multiple arteriovenous malformations which could definitely be the contributor to her drifting hemoglobin and hematocrit  . TUBAL LIGATION    . VIDEO BRONCHOSCOPY WITH ENDOBRONCHIAL ULTRASOUND N/A 04/27/2018   Procedure: VIDEO BRONCHOSCOPY WITH ENDOBRONCHIAL ULTRASOUND;  Surgeon: Melrose Nakayama, MD;  Location: [facility] OR;  Service: Thoracic;  Laterality: N/A;    REVIEW OF SYSTEMS:  A comprehensive review of systems was negative except for: Respiratory: positive for dyspnea on exertion   PHYSICAL EXAMINATION: General appearance: alert, cooperative and no distress Head: Normocephalic, without obvious abnormality, atraumatic Neck: no adenopathy, no JVD, supple, symmetrical, trachea midline and thyroid not enlarged, symmetric, no tenderness/mass/nodules Lymph nodes: Cervical, supraclavicular, and axillary nodes normal. Resp: clear to auscultation bilaterally Back: symmetric, no curvature. ROM normal. No CVA tenderness. Cardio: regular rate and rhythm, S1, S2 normal, no murmur, click, rub or gallop GI: soft, non-tender; bowel sounds normal; no masses,  no organomegaly Extremities: extremities normal, atraumatic, no cyanosis or edema  ECOG PERFORMANCE STATUS: 1 - Symptomatic but completely ambulatory  Blood pressure (!) 139/59, pulse 84, temperature 97.6 F (36.4 C), temperature source Tympanic, resp. rate 18, weight  180 lb (81.6 kg), SpO2 100 %.  LABORATORY DATA: Lab Results  Component Value Date   WBC 4.5 06/13/2020   HGB 10.6 (L) 06/13/2020   HCT 33.7 (L) 06/13/2020   MCV 85.1 06/13/2020   PLT 215 06/13/2020      Chemistry      Component Value Date/Time   NA 136 06/13/2020 0926   K 4.4 06/13/2020 0926   CL 104 06/13/2020 0926   CO2 22 06/13/2020 0926   BUN 33 (H) 06/13/2020 0926   BUN 24 (A) 02/12/2020 0000   CREATININE 1.46 (H) 06/13/2020 0926   CREATININE 0.72 12/10/2011 0919      Component Value Date/Time   CALCIUM 10.1 06/13/2020 0926   ALKPHOS 97 06/13/2020 0926   ALKPHOS 88 12/10/2011 0919   AST 18 06/13/2020 0926   ALT 20 06/13/2020 0926   BILITOT 0.3 06/13/2020 0926       RADIOGRAPHIC STUDIES: CT Chest Wo Contrast  Result Date: 06/13/2020 CLINICAL DATA:  Primary Cancer Type: Lung Imaging Indication: Routine surveillance Interval therapy since last imaging? No Initial Cancer Diagnosis Date: 04/27/2018; Established by: Biopsy-proven Detailed Pathology: Stage IIIB non-small cell lung cancer, squamous cell carcinoma. Primary Tumor location:  Right middle lobe. Surgeries: Cholecystectomy. Chemotherapy: Yes; Ongoing? No; Most recent administration: 08/03/2018 Immunotherapy?  Yes; Type: Imfinzi; Ongoing? No Radiation therapy? Yes; Date Range: 05/24/2018-07/10/2018; Target: Right lung EXAM:  CT CHEST WITHOUT CONTRAST TECHNIQUE: Multidetector CT imaging of the chest was performed following the standard protocol without IV contrast. COMPARISON:  Most recent CT chest 02/12/2020.  04/28/2018 PET-CT. FINDINGS: Cardiovascular: Coronary, aortic arch, and branch vessel atherosclerotic vascular disease. Mediastinum/Nodes: Right paratracheal node 1.2 cm in short axis on image 31 of series 2, previously 1.4 cm. Other smaller right upper paratracheal lymph nodes are stable. A right lower paratracheal lymph node anterior to the carina measures 0.6 cm in short axis on image 52 of series 2, previously  the same. Lungs/Pleura: Continued right perihilar consolidation involving the upper lobe, lower lobe, and middle lobe with progressive and now complete atelectasis of the right middle lobe. Air bronchograms within the region of consolidation noted. The right upper lobe consolidation extends along the paramediastinal region, as before. Subtle reticulonodular opacities in the right lower lobe, similar to previous. Faint peribronchovascular interstitial accentuation in the left lower lobe is stable. Subtle nodularity in the left lower lobe including a 0.4 by 0.3 cm left lower lobe nodule on image 87 of series 5, stable. Small right pleural effusion, stable. Upper Abdomen: Cholecystectomy. Musculoskeletal: Stable abnormal sclerosis and deformity in the mid sternal body. Thoracic spondylosis. IMPRESSION: 1. Continued right perihilar consolidation involving the upper lobe, lower lobe, and middle lobe, with progressive and now complete atelectasis of the right middle lobe. This may well be treatment related although the complete atelectasis of the right middle lobe obviously could obscure an underlying lesion. 2. Minimal reduction in size of the dominant right paratracheal lymph node. 3. Stable small right pleural effusion. 4. Stable abnormal sclerosis and deformity in the mid sternal body, probably related to prior pathologic fracture. 5. Coronary, aortic arch, and branch vessel atherosclerotic vascular disease. 6. Aortic atherosclerosis. Aortic Atherosclerosis (ICD10-I70.0). Electronically Signed   By: Van Clines M.D.   On: 06/13/2020 12:54    ASSESSMENT AND PLAN: This is a very pleasant 74 years old African-American female recently diagnosed with a stage IIIB non-small cell lung cancer, squamous cell carcinoma.  She completed a course of concurrent chemoradiation with weekly carboplatin and paclitaxel status post 5 cycles with partial response.  She tolerated this treatment well except for the pancytopenia  and fatigue.  The patient completed a course of treatment with consolidation immunotherapy with Imfinzi status post 26 cycles.  She tolerated her treatment well with no concerning adverse effects. The patient is currently on observation and she is feeling fine today with no concerning complaints except for the mild shortness of breath. She had repeat CT scan of the chest performed recently.  I personally and independently reviewed the scans and discussed the results with the patient today. Her scan showed continuous evolving of the radiation changes but no significant evidence for disease progression. I recommended for the patient to continue on observation with repeat CT scan of the chest in 6 months. For the hypothyroidism, she will continue her follow-up visit with her primary care physician and close monitoring of her thyroid function. The patient was advised to call immediately if she has any concerning symptoms in the interval. The patient voices understanding of current disease status and treatment options and is in agreement with the current care plan. All questions were answered. The patient knows to call the clinic with any problems, questions or concerns. We can certainly see the patient much sooner if necessary.  Disclaimer: This note was dictated with voice recognition software. Similar sounding words can inadvertently be transcribed and may not be corrected upon review.

## 2020-06-19 ENCOUNTER — Telehealth: Payer: Self-pay | Admitting: Internal Medicine

## 2020-06-19 NOTE — Telephone Encounter (Signed)
Scheduled per los. Called and left msg. Mailed printout  °

## 2020-06-24 ENCOUNTER — Ambulatory Visit (HOSPITAL_COMMUNITY): Payer: Medicare Other | Attending: Cardiology

## 2020-06-24 ENCOUNTER — Other Ambulatory Visit: Payer: Self-pay

## 2020-06-24 DIAGNOSIS — R0600 Dyspnea, unspecified: Secondary | ICD-10-CM

## 2020-06-24 DIAGNOSIS — R0609 Other forms of dyspnea: Secondary | ICD-10-CM

## 2020-06-24 LAB — ECHOCARDIOGRAM COMPLETE
Area-P 1/2: 3.46 cm2
P 1/2 time: 350 msec
S' Lateral: 3.1 cm

## 2020-07-29 ENCOUNTER — Other Ambulatory Visit: Payer: Self-pay

## 2020-07-29 ENCOUNTER — Ambulatory Visit (INDEPENDENT_AMBULATORY_CARE_PROVIDER_SITE_OTHER): Payer: Medicare Other | Admitting: Endocrinology

## 2020-07-29 ENCOUNTER — Encounter: Payer: Self-pay | Admitting: Endocrinology

## 2020-07-29 VITALS — BP 160/70 | Ht 65.0 in | Wt 183.2 lb

## 2020-07-29 DIAGNOSIS — E119 Type 2 diabetes mellitus without complications: Secondary | ICD-10-CM | POA: Diagnosis not present

## 2020-07-29 DIAGNOSIS — E1369 Other specified diabetes mellitus with other specified complication: Secondary | ICD-10-CM | POA: Diagnosis not present

## 2020-07-29 LAB — POCT GLYCOSYLATED HEMOGLOBIN (HGB A1C): Hemoglobin A1C: 11.3 % — AB (ref 4.0–5.6)

## 2020-07-29 MED ORDER — RYBELSUS 3 MG PO TABS: 3.0000 mg | ORAL_TABLET | Freq: Every day | ORAL | 11 refills | Status: DC

## 2020-07-29 NOTE — Patient Instructions (Signed)
good diet and exercise significantly improve the control of your diabetes.  please let me know if you wish to be referred to a dietician.  high blood sugar is very risky to your health.  you should see an eye doctor and dentist every year.  It is very important to get all recommended vaccinations.  Controlling your blood pressure and cholesterol drastically reduces the damage diabetes does to your body.  Those who smoke should quit.  Please discuss these with your doctor.  I have sent a prescription to your pharmacy, to add "Rybelsus,"  Please continue the same other medications.  Please come back for a follow-up appointment in 1 month.  Please see a diabetes educator the same day, to learn about insulin.

## 2020-07-29 NOTE — Progress Notes (Signed)
Subjective:    Patient ID: Jenna [name], female    DOB: [birth year], 74 y.o.   MRN: [id number]  HPI pt is referred by Dr Bryan Lemma, for diabetes.  Pt states DM was dx'ed in 4196; it is complicated by CRI and PAD; she has never been on insulin, and she declines; pt says her diet and exercise are fair; she has never had GDM, pancreatitis, pancreatic surgery, severe hypoglycemia or DKA.  She also declines to check cbg's.   Past Medical History:  Diagnosis Date  . Blood transfusion without reported diagnosis   . Cataract   . DM (diabetes mellitus) ([place])   . GERD (gastroesophageal reflux disease)   . Hyperlipidemia   . Hypertension   . Iron deficiency anemia   . NSCL ca dx'd 03/2018  . Thyroid disease     Past Surgical History:  Procedure Laterality Date  . CATARACT EXTRACTION Right   . CHOLECYSTECTOMY    . COLONOSCOPY  10/24/2009   normal rectum/1X1cm abnormal lesion in the ascending colon (bx benign). TI normal for 10cm.  Prep difficult/inadequate. f/u TCS 09/2012 recommended  . COLONOSCOPY  10/13/2004   Normal rectum/Diminutive polyps, splenic flexure, cold biopsied/removed.  Remainder of colonic mucosa appeared normal.  . COLONOSCOPY N/A 12/14/2012   [id number] polyp-tubular adenoma  . ESOPHAGOGASTRODUODENOSCOPY  10/13/2004    Normal esophagus/ Nodular volcano like lesion in the antrum, either representing a  pancreatic rest or leiomyoma, biopsied.  Remainder of the gastric mucosa appeared normal, normal D1-D2  . ESOPHAGOGASTRODUODENOSCOPY  10/24/2009   Benign biopsies. normal esophagus/small hiatal hernia/nodular lesion antrum/distal greater curvature. duodenal AVM s/p ablation  . GIVENS CAPSULE STUDY  07/27/2010    multiple arteriovenous malformations which could definitely be the contributor to her drifting hemoglobin and hematocrit  . TUBAL LIGATION    . VIDEO BRONCHOSCOPY WITH ENDOBRONCHIAL ULTRASOUND N/A 04/27/2018   Procedure: VIDEO BRONCHOSCOPY WITH ENDOBRONCHIAL  ULTRASOUND;  Surgeon: Melrose Nakayama, MD;  [facility];  Service: Thoracic;  Laterality: N/A;    Social History   Socioeconomic History  . Marital status: Single    Spouse name: Not on file  . Number of children: Not on file  . Years of education: Not on file  . Highest education level: Not on file  Occupational History  . Not on file  Tobacco Use  . Smoking status: Former Smoker    Packs/day: 0.50    Years: 55.00    Pack years: 27.50    Types: Cigarettes    Quit date: 03/18/2018    Years since quitting: 2.3  . Smokeless tobacco: Never Used  . Tobacco comment: smoked off and n  Vaping Use  . Vaping Use: Never used  Substance and Sexual Activity  . Alcohol use: No  . Drug use: No  . Sexual activity: Not on file  Other Topics Concern  . Not on file  Social History Narrative   Patient fully vaccinated   Social Determinants of Health   Financial Resource Strain: Low Risk   . Difficulty of Paying Living Expenses: Not very hard  Food Insecurity: No Food Insecurity  . Worried About Charity fundraiser in the Last Year: Never true  . Ran Out of Food in the Last Year: Never true  Transportation Needs: No Transportation Needs  . Lack of Transportation (Medical): No  . Lack of Transportation (Non-Medical): No  Physical Activity: Not on file  Stress: Not on file  Social Connections: Not on file  Intimate Partner  Violence: Not on file    Current Outpatient Medications on File Prior to Visit  Medication Sig Dispense Refill  . atorvastatin (LIPITOR) 40 MG tablet Take 1 tablet (40 mg total) by mouth daily. 90 tablet 3  . blood glucose meter kit and supplies KIT Use up to four times daily as directed 1 each 0  . dapagliflozin propanediol (FARXIGA) 10 MG TABS tablet Take 1 tablet (10 mg total) by mouth daily before breakfast. 90 tablet 3  . Dexlansoprazole 30 MG capsule Take 1 capsule (30 mg total) by mouth daily. 30 capsule 3  . diphenhydrAMINE (BENADRYL) 2 % cream  Apply 1 application topically as needed for itching.    . docusate sodium (COLACE) 100 MG capsule Take 100 mg by mouth daily as needed for mild constipation.    . Ferrous Sulfate (IRON) 325 (65 Fe) MG TABS TAKE 1 TABLET BY MOUTH TWICE DAILY WITH A MEAL 180 tablet 0  . glipiZIDE (GLUCOTROL) 10 MG tablet Take one tablet by mouth two times daily 180 tablet 3  . labetalol (NORMODYNE) 100 MG tablet Take 0.5 tablets (50 mg total) by mouth 2 (two) times daily. 90 tablet 3  . levothyroxine (EUTHYROX) 100 MCG tablet TAKE 1 TABLET BY MOUTH ONCE DAILY BEFORE BREAKFAST 90 tablet 3  . losartan (COZAAR) 25 MG tablet Take 1 tablet (25 mg total) by mouth daily. 90 tablet 3  . Multiple Vitamin (MULTIVITAMIN WITH MINERALS) TABS tablet Take 1 tablet by mouth at bedtime.      Current Facility-Administered Medications on File Prior to Visit  Medication Dose Route Frequency Provider Last Rate Last Admin  . promethazine (PHENERGAN) injection 12.5 mg  12.5 mg Intravenous Once Harle Stanford., PA-C      . Tbo-Filgrastim (GRANIX) injection 300 mcg  300 mcg Subcutaneous Once Curt Bears, MD        Allergies  Allergen Reactions  . Paclitaxel Other (See Comments)    Unresponsiveness shortly after Taxol inf started 06/12/18.  . Aspirin Other (See Comments)    Stomach bleeding   . Esomeprazole Magnesium     UNSPECIFIED REACTION   . Ace Inhibitors Other (See Comments)    Dizziness, drunk like    Family History  Problem Relation Age of Onset  . Diabetes Sister   . Colon cancer Maternal Aunt        greater than age 56  . Breast cancer Cousin   . Amblyopia Neg Hx   . Blindness Neg Hx   . Cataracts Neg Hx   . Glaucoma Neg Hx   . Macular degeneration Neg Hx   . Retinal detachment Neg Hx   . Strabismus Neg Hx   . Retinitis pigmentosa Neg Hx   . Rectal cancer Neg Hx   . Stomach cancer Neg Hx   . Colon polyps Neg Hx   . Esophageal cancer Neg Hx     BP (!) 160/70   Ht _0  (1.651 m)   Wt 183 lb 3.2 oz  (83.1 kg)   BMI 30.49 kg/m     Review of Systems denies weight loss, blurry vision, chest pain, n/v, urinary frequency, memory loss, and depression.  She has chronic sob.      Objective:   Physical Exam VITAL SIGNS:  See vs page GENERAL: no distress Pulses: dorsalis pedis intact bilat.   MSK: no deformity of the feet CV: trace bilat leg edema Skin:  no ulcer on the feet.  normal color and temp on the  feet.  Neuro: sensation is intact to touch on the feet.      Lab Results  Component Value Date   CHOL 201 (H) 06/11/2020   HDL 46.10 06/11/2020   LDLCALC 124 (H) 06/11/2020   TRIG 155.0 (H) 06/11/2020   CHOLHDL 4 06/11/2020   Lab Results  Component Value Date   HGBA1C 11.3 (A) 07/29/2020    Lab Results  Component Value Date   CREATININE 1.46 (H) 06/13/2020   BUN 33 (H) 06/13/2020   NA 136 06/13/2020   K 4.4 06/13/2020   CL 104 06/13/2020   CO2 22 06/13/2020   I have reviewed outside records, and summarized: Pt was noted to have elevated A1c, and referred here.  Referral says dyslipidemia, but notes suggest DM    Assessment & Plan:  Type 2 DM, with stage 3 CRI: uncontrolled.  She needs insulin.   Patient Instructions  good diet and exercise significantly improve the control of your diabetes.  please let me know if you wish to be referred to a dietician.  high blood sugar is very risky to your health.  you should see an eye doctor and dentist every year.  It is very important to get all recommended vaccinations.  Controlling your blood pressure and cholesterol drastically reduces the damage diabetes does to your body.  Those who smoke should quit.  Please discuss these with your doctor.  I have sent a prescription to your pharmacy, to add "Rybelsus,"  Please continue the same other medications.  Please come back for a follow-up appointment in 1 month.  Please see a diabetes educator the same day, to learn about insulin.

## 2020-08-13 ENCOUNTER — Encounter: Payer: Medicare Other | Attending: Endocrinology | Admitting: Nutrition

## 2020-08-13 ENCOUNTER — Other Ambulatory Visit: Payer: Self-pay

## 2020-08-13 DIAGNOSIS — E119 Type 2 diabetes mellitus without complications: Secondary | ICD-10-CM | POA: Diagnosis not present

## 2020-08-17 NOTE — Patient Instructions (Signed)
Stop eating cold cereal and milk. Review list of other breakfast choices. Test blood sugars once a day, before meals, or bedtime. Call if questions.

## 2020-08-17 NOTE — Progress Notes (Signed)
Patient is requesting information on insulin, but says she does not want to do this.  Discussed diet.  It appears she is eating 2 meals: 1:15ZM: 2 slices of liver pudding, and 1 bowl of Cheerios with stawberries, and milk.   1-3PM: Goes to [employer]:  Baked chicken, 2 non starchy veg., and one starchy veg.  With low calorie lemonade.   Denies eating after supper, except 2X/wk, eating popcorn. Discussed: 1. the fact that she is not eating a very bad diet, except for cereal and milk, Suggestions/handout given for other breakfast choices.  But stressed fact that she has very limited dietary changes that need to be made, and that her body is possible running out of insulin production.  2. Discussed  need to test blood sugar to determine what type of insulin is needed, She was given a Accu-chek guide and extra test strips to test ac and HS-once a day-alternating the meal each day, until she sees him in 2 weeks.  She agreed to do this. 3.  She was shown how to use an insulin pen--attaching a needle priming the pen, dialing the dose, different injection sites, and how to inject and deliver the insulin.  She reported good understanding of this. 4.  Also discussed low blood sugars-symptoms and treatments.  She reported good understanding of this and and had no final quesitons.

## 2020-08-20 ENCOUNTER — Telehealth: Payer: Self-pay | Admitting: Nutrition

## 2020-08-20 NOTE — Telephone Encounter (Signed)
Patient reported that she has been testing her blood sugar once a day, but was not able to give them to me at this time.  Has not tested today, and can not remember what it was yesterday. She reports having to go the doctor tomorrow, and was encouraged to bring her meter with her.  She agreed to do this.  She reports having read over dietary sheets given, and she had no dietary questions for me at this time.  She was given my number to call if she has questions.

## 2020-08-25 ENCOUNTER — Telehealth: Payer: Self-pay

## 2020-08-25 NOTE — Telephone Encounter (Signed)
Called and spoke to patient to ask her to go to the lab for CBC. She indicated she is seeing Dr. Loanne Drilling with oncology on Thursday and is having blood work. She will ask them to send Dr. Havery Moros results.

## 2020-08-25 NOTE — Telephone Encounter (Signed)
-----   Message from Roetta Sessions, [place] sent at 05/29/2020 10:40 AM EDT ----- Regarding: cbc due in late January Patient needs to go to lab for cbc one day in the next couple of weeks. Order is in

## 2020-08-27 ENCOUNTER — Other Ambulatory Visit: Payer: Self-pay

## 2020-08-28 ENCOUNTER — Other Ambulatory Visit: Payer: Self-pay | Admitting: *Deleted

## 2020-08-28 ENCOUNTER — Encounter: Payer: Self-pay | Admitting: Endocrinology

## 2020-08-28 ENCOUNTER — Ambulatory Visit (INDEPENDENT_AMBULATORY_CARE_PROVIDER_SITE_OTHER): Payer: Medicare Other | Admitting: Endocrinology

## 2020-08-28 VITALS — BP 142/86 | HR 93 | Ht 65.0 in | Wt 179.0 lb

## 2020-08-28 DIAGNOSIS — E119 Type 2 diabetes mellitus without complications: Secondary | ICD-10-CM

## 2020-08-28 LAB — BASIC METABOLIC PANEL
BUN: 23 mg/dL (ref 6–23)
CO2: 23 mEq/L (ref 19–32)
Calcium: 9.6 mg/dL (ref 8.4–10.5)
Chloride: 108 mEq/L (ref 96–112)
Creatinine, Ser: 1.31 mg/dL — ABNORMAL HIGH (ref 0.40–1.20)
GFR: 40.21 mL/min — ABNORMAL LOW (ref >60.00)
Glucose, Bld: 264 mg/dL — ABNORMAL HIGH (ref 70–99)
Potassium: 4.2 mEq/L (ref 3.5–5.1)
Sodium: 138 mEq/L (ref 135–145)

## 2020-08-28 LAB — CBC WITH DIFFERENTIAL/PLATELET
Basophils Absolute: 0 10*3/uL (ref 0.0–0.1)
Basophils Relative: 0.4 % (ref 0.0–3.0)
Eosinophils Absolute: 0.1 10*3/uL (ref 0.0–0.7)
Eosinophils Relative: 1.2 % (ref 0.0–5.0)
HCT: 33.8 % — ABNORMAL LOW (ref 36.0–46.0)
Hemoglobin: 10.8 g/dL — ABNORMAL LOW (ref 12.0–15.0)
Lymphocytes Relative: 9.3 % — ABNORMAL LOW (ref 12.0–46.0)
Lymphs Abs: 0.4 10*3/uL — ABNORMAL LOW (ref 0.7–4.0)
MCHC: 31.8 g/dL (ref 30.0–36.0)
MCV: 81.9 fl (ref 78.0–100.0)
Monocytes Absolute: 0.2 10*3/uL (ref 0.1–1.0)
Monocytes Relative: 5.7 % (ref 3.0–12.0)
Neutro Abs: 3.6 10*3/uL (ref 1.4–7.7)
Neutrophils Relative %: 83.4 % — ABNORMAL HIGH (ref 43.0–77.0)
Platelets: 215 10*3/uL (ref 150.0–400.0)
RBC: 4.13 Mil/uL (ref 3.87–5.11)
RDW: 15.8 % — ABNORMAL HIGH (ref 11.5–15.5)
WBC: 4.3 10*3/uL (ref 4.0–10.5)

## 2020-08-28 LAB — TSH: TSH: 0.02 u[IU]/mL — ABNORMAL LOW (ref 0.35–4.50)

## 2020-08-28 LAB — POCT GLYCOSYLATED HEMOGLOBIN (HGB A1C): Hemoglobin A1C: 10.1 % — AB (ref 4.0–5.6)

## 2020-08-28 LAB — IBC PANEL
Iron: 60 ug/dL (ref 42–145)
Saturation Ratios: 18.5 % — ABNORMAL LOW (ref 20.0–50.0)
Transferrin: 232 mg/dL (ref 212.0–360.0)

## 2020-08-28 LAB — T4, FREE: Free T4: 1.38 ng/dL (ref 0.60–1.60)

## 2020-08-28 MED ORDER — LEVOTHYROXINE SODIUM 75 MCG PO TABS
75.0000 ug | ORAL_TABLET | Freq: Every day | ORAL | 3 refills | Status: DC
Start: 2020-08-28 — End: 2021-08-03

## 2020-08-28 MED ORDER — LANTUS SOLOSTAR 100 UNIT/ML ~~LOC~~ SOPN
10.0000 [IU] | PEN_INJECTOR | SUBCUTANEOUS | 99 refills | Status: DC
Start: 2020-08-28 — End: 2021-04-28

## 2020-08-28 NOTE — Patient Instructions (Addendum)
Your blood pressure is high today.  Please see your primary care provider soon, to have it rechecked check your blood sugar twice a day.  vary the time of day when you check, between before the 3 meals, and at bedtime.  also check if you have symptoms of your blood sugar being too high or too low.  please keep a record of the readings and bring it to your next appointment here (or you can bring the meter itself).  You can write it on any piece of paper.  please call us sooner if your blood sugar goes below 70, or if you have a lot of readings over 200.I have sent a prescription to your pharmacy, to add "Lantus," 10 units each morning.    Please continue the same other medications.  Please come back for a follow-up appointment in 1 month.

## 2020-08-28 NOTE — Progress Notes (Signed)
Subjective:    Patient ID: Jenna [name], female    DOB: 23-Jul-1946, 75 y.o.   MRN: [id number]  HPI Pt returns for f/u of diabetes mellitus:  DM type: Insulin-requiring type 2.   Dx'ed: 6834 Complications: CRI and PAD.   Therapy: 2 oral meds.   GDM: never DKA: never Severe hypoglycemia: never Pancreatitis: never Pancreatic imaging: never SDOH: She declines to check cbg's. Other: she lost weight with XRT, but regained afterwards.   Interval history: pt states she feels well in general.  She takes meds as rx'ed.  She stopped Rybelsus, due to abd pain.  Dr Enis Gash requests cbc Past Medical History:  Diagnosis Date  . Blood transfusion without reported diagnosis   . Cataract   . DM (diabetes mellitus) ([place])   . GERD (gastroesophageal reflux disease)   . Hyperlipidemia   . Hypertension   . Iron deficiency anemia   . NSCL ca dx'd 03/2018  . Thyroid disease     Past Surgical History:  Procedure Laterality Date  . CATARACT EXTRACTION Right   . CHOLECYSTECTOMY    . COLONOSCOPY  10/24/2009   normal rectum/1X1cm abnormal lesion in the ascending colon (bx benign). TI normal for 10cm.  Prep difficult/inadequate. f/u TCS 09/2012 recommended  . COLONOSCOPY  10/13/2004   Normal rectum/Diminutive polyps, splenic flexure, cold biopsied/removed.  Remainder of colonic mucosa appeared normal.  . COLONOSCOPY N/A 12/14/2012   HDQ:QIWLNLG polyp-tubular adenoma  . ESOPHAGOGASTRODUODENOSCOPY  10/13/2004    Normal esophagus/ Nodular volcano like lesion in the antrum, either representing a  pancreatic rest or leiomyoma, biopsied.  Remainder of the gastric mucosa appeared normal, normal D1-D2  . ESOPHAGOGASTRODUODENOSCOPY  10/24/2009   Benign biopsies. normal esophagus/small hiatal hernia/nodular lesion antrum/distal greater curvature. duodenal AVM s/p ablation  . GIVENS CAPSULE STUDY  07/27/2010    multiple arteriovenous malformations which could definitely be the contributor to her  drifting hemoglobin and hematocrit  . TUBAL LIGATION    . VIDEO BRONCHOSCOPY WITH ENDOBRONCHIAL ULTRASOUND N/A 04/27/2018   Procedure: VIDEO BRONCHOSCOPY WITH ENDOBRONCHIAL ULTRASOUND;  Surgeon: Melrose Nakayama, MD;  Location: [facility] OR;  Service: Thoracic;  Laterality: N/A;    Social History   Socioeconomic History  . Marital status: Single    Spouse name: Not on file  . Number of children: Not on file  . Years of education: Not on file  . Highest education level: Not on file  Occupational History  . Not on file  Tobacco Use  . Smoking status: Former Smoker    Packs/day: 0.50    Years: 55.00    Pack years: 27.50    Types: Cigarettes    Quit date: 03/18/2018    Years since quitting: 2.4  . Smokeless tobacco: Never Used  . Tobacco comment: smoked off and n  Vaping Use  . Vaping Use: Never used  Substance and Sexual Activity  . Alcohol use: No  . Drug use: No  . Sexual activity: Not on file  Other Topics Concern  . Not on file  Social History Narrative   Patient fully vaccinated   Social Determinants of Health   Financial Resource Strain: Low Risk   . Difficulty of Paying Living Expenses: Not very hard  Food Insecurity: No Food Insecurity  . Worried About Charity fundraiser in the Last Year: Never true  . Ran Out of Food in the Last Year: Never true  Transportation Needs: No Transportation Needs  . Lack of Transportation (Medical): No  .  Lack of Transportation (Non-Medical): No  Physical Activity: Not on file  Stress: Not on file  Social Connections: Not on file  Intimate Partner Violence: Not on file    Current Outpatient Medications on File Prior to Visit  Medication Sig Dispense Refill  . atorvastatin (LIPITOR) 40 MG tablet Take 1 tablet (40 mg total) by mouth daily. 90 tablet 3  . blood glucose meter kit and supplies KIT Use up to four times daily as directed 1 each 0  . dapagliflozin propanediol (FARXIGA) 10 MG TABS tablet Take 1 tablet (10 mg total) by  mouth daily before breakfast. 90 tablet 3  . Dexlansoprazole 30 MG capsule Take 1 capsule (30 mg total) by mouth daily. 30 capsule 3  . diphenhydrAMINE (BENADRYL) 2 % cream Apply 1 application topically as needed for itching.    . docusate sodium (COLACE) 100 MG capsule Take 100 mg by mouth daily as needed for mild constipation.    . Ferrous Sulfate (IRON) 325 (65 Fe) MG TABS TAKE 1 TABLET BY MOUTH TWICE DAILY WITH A MEAL 180 tablet 0  . glipiZIDE (GLUCOTROL) 10 MG tablet Take one tablet by mouth two times daily 180 tablet 3  . labetalol (NORMODYNE) 100 MG tablet Take 0.5 tablets (50 mg total) by mouth 2 (two) times daily. 90 tablet 3  . losartan (COZAAR) 25 MG tablet Take 1 tablet (25 mg total) by mouth daily. 90 tablet 3  . Multiple Vitamin (MULTIVITAMIN WITH MINERALS) TABS tablet Take 1 tablet by mouth at bedtime.     . Semaglutide (RYBELSUS) 3 MG TABS Take 3 mg by mouth daily. 30 tablet 11   Current Facility-Administered Medications on File Prior to Visit  Medication Dose Route Frequency Provider Last Rate Last Admin  . promethazine (PHENERGAN) injection 12.5 mg  12.5 mg Intravenous Once Harle Stanford., PA-C      . Tbo-Filgrastim (GRANIX) injection 300 mcg  300 mcg Subcutaneous Once Curt Bears, MD        Allergies  Allergen Reactions  . Paclitaxel Other (See Comments)    Unresponsiveness shortly after Taxol inf started 06/12/18.  . Aspirin Other (See Comments)    Stomach bleeding   . Esomeprazole Magnesium     UNSPECIFIED REACTION   . Ace Inhibitors Other (See Comments)    Dizziness, drunk like    Family History  Problem Relation Age of Onset  . Diabetes Sister   . Colon cancer Maternal Aunt        greater than age 66  . Breast cancer Cousin   . Amblyopia Neg Hx   . Blindness Neg Hx   . Cataracts Neg Hx   . Glaucoma Neg Hx   . Macular degeneration Neg Hx   . Retinal detachment Neg Hx   . Strabismus Neg Hx   . Retinitis pigmentosa Neg Hx   . Rectal cancer Neg Hx    . Stomach cancer Neg Hx   . Colon polyps Neg Hx   . Esophageal cancer Neg Hx     BP (!) 142/86   Pulse 93   Ht $R'5\' 5"'qL$  (1.651 m)   Wt 179 lb (81.2 kg)   SpO2 95%   BMI 29.79 kg/m   Review of Systems She denies hypoglycemia sxs.      Objective:   Physical Exam VITAL SIGNS:  See vs page GENERAL: no distress Pulses: dorsalis pedis intact bilat.   MSK: no deformity of the feet CV: trace bilat leg edema Skin:  no  ulcer on the feet.  normal color and temp on the feet.  Neuro: sensation is intact to touch on the feet.   A1c=10.1%     Assessment & Plan:  HTN: is noted today.  Type 2 DM, with PAD: uncontrolled.  She agrees to take insulin, but she declines multiple daily injections.    Patient Instructions  Your blood pressure is high today.  Please see your primary care provider soon, to have it rechecked check your blood sugar twice a day.  vary the time of day when you check, between before the 3 meals, and at bedtime.  also check if you have symptoms of your blood sugar being too high or too low.  please keep a record of the readings and bring it to your next appointment here (or you can bring the meter itself).  You can write it on any piece of paper.  please call us sooner if your blood sugar goes below 70, or if you have a lot of readings over 200.I have sent a prescription to your pharmacy, to add "Lantus," 10 units each morning.    Please continue the same other medications.  Please come back for a follow-up appointment in 1 month.

## 2020-08-29 ENCOUNTER — Telehealth: Payer: Self-pay

## 2020-08-29 NOTE — Telephone Encounter (Signed)
Patient informed of results and advised to start taking 15mcg instead of 182mcg.

## 2020-08-29 NOTE — Telephone Encounter (Signed)
-----   Message from Renato Shin, MD sent at 08/28/2020  6:28 PM EST ----- please contact patient: We need to reduce the thyroid pill.  I have sent a prescription to your pharmacy.  The kidneys are a little better--good.  You are anemic again.  I am forwarding to Dr Havery Moros.  I'll see you next time.

## 2020-09-02 NOTE — Telephone Encounter (Signed)
Spoke with patient in regards to her most recent lab results, advised that she will need to continue her iron supplements. She is aware that we will repeat labs in about 4 months. Patient verbalized understanding and had no concerns at the end of the call.   Lab order and reminder in epic.

## 2020-09-02 NOTE — Telephone Encounter (Signed)
[name], Jenna Raspberry, MD  Renato Shin, MD; Yevette Edwards, RN No problem, our office will touch base with her. Thanks   [place] can you let the patient know I have reviewed her labs and things are stable. Continue iron, we will plan on repeating another CBC in 4 months or so. Thanks   Richardson Landry

## 2020-09-12 ENCOUNTER — Other Ambulatory Visit: Payer: Self-pay

## 2020-09-12 ENCOUNTER — Ambulatory Visit (INDEPENDENT_AMBULATORY_CARE_PROVIDER_SITE_OTHER): Payer: Medicare Other | Admitting: Family Medicine

## 2020-09-12 ENCOUNTER — Encounter: Payer: Self-pay | Admitting: Family Medicine

## 2020-09-12 VITALS — BP 142/66 | HR 82 | Temp 97.0°F | Ht 65.0 in | Wt 182.6 lb

## 2020-09-12 DIAGNOSIS — E039 Hypothyroidism, unspecified: Secondary | ICD-10-CM | POA: Diagnosis not present

## 2020-09-12 DIAGNOSIS — I10 Essential (primary) hypertension: Secondary | ICD-10-CM | POA: Diagnosis not present

## 2020-09-12 DIAGNOSIS — E785 Hyperlipidemia, unspecified: Secondary | ICD-10-CM | POA: Diagnosis not present

## 2020-09-12 DIAGNOSIS — Z23 Encounter for immunization: Secondary | ICD-10-CM

## 2020-09-12 DIAGNOSIS — E1165 Type 2 diabetes mellitus with hyperglycemia: Secondary | ICD-10-CM

## 2020-09-12 LAB — LIPID PANEL
Cholesterol: 127 mg/dL (ref 0–200)
HDL: 39.8 mg/dL (ref >39.00)
LDL Cholesterol: 69 mg/dL (ref 0–99)
NonHDL: 86.95
Total CHOL/HDL Ratio: 3
Triglycerides: 91 mg/dL (ref 0.0–149.0)
VLDL: 18.2 mg/dL (ref 0.0–40.0)

## 2020-09-12 LAB — AST: AST: 22 U/L (ref 0–37)

## 2020-09-12 LAB — ALT: ALT: 18 U/L (ref 0–35)

## 2020-09-12 MED ORDER — LOSARTAN POTASSIUM 50 MG PO TABS: 50.0000 mg | ORAL_TABLET | Freq: Every day | ORAL | 3 refills | Status: DC

## 2020-09-12 NOTE — Addendum Note (Signed)
Addended by: Konrad Saha on: 09/12/2020 10:45 AM   Modules accepted: Orders

## 2020-09-12 NOTE — Patient Instructions (Addendum)
For your BP: Increase losartan from 25mg  to 50mg  daily. Ok to take 2 25mg  tabs together until that runs out and then start taking 50mg  tabs 1 tab daily Cont with labetalol 50mg  2x/day (1/2 of 100mg  tab 2x/day)  Delayed release melatonin (Natrol) 3mg -5mg  30-39min prior to bedtime

## 2020-09-12 NOTE — Progress Notes (Signed)
Jenna [name] is a 75 y.o. female  Chief Complaint  Patient presents with  . Follow-up    3 month f/u meds.  Wants to think about flu shot.     HPI: Jenna [name] is a 75 y.o. female seen today for routine f/u on chronic medical issues including HTN, HLD, DM, IDA, hypothyroidism. Pt follows with Dr. Loanne Drilling (endo) for her DM and hypothyroidism, and was last seen earlier this month. At that time, her levothyroxine dose was decreased from 132mcg to 62mcg. Lantus 10 units daily was added to her DM medication regimen.   Lab Results  Component Value Date   HGBA1C 10.1 (A) 08/28/2020   Lab Results  Component Value Date   TSH 0.02 (L) 08/28/2020   BP was elevated at endo appt. Pt is taking losartan $RemoveBeforeD'25mg'jPVWoeFRwNxwKr$  daily and labetalol $RemoveBefor'50mg'KUmrWCaHeBfR$  BID. Pt denies HA, lightheadedness, CP, SOB, LE edema. Creatinine and GFR with slight improvement compared to prior labs 4 and 7 mo ago. Diet: appetite is "excellent" as per patient Exercise/Activity: she has been walking regularly other than in the past 2-3 weeks with winter weather  BP Readings from Last 3 Encounters:  09/12/20 (!) 142/66  08/28/20 (!) 142/86  07/29/20 (!) 160/70   Lab Results  Component Value Date   NA 138 08/28/2020   K 4.2 08/28/2020   CREATININE 1.31 (H) 08/28/2020   GFRNONAA 38 (L) 06/13/2020   GFRAA 43 (L) 02/12/2020   GLUCOSE 264 (H) 08/28/2020    For HLD, pt is taking lipitor $RemoveBefore'40mg'hymRnclJQMbMR$  daily. At last OV in 05/2020, we switch her from simvastatin to atorvastatin.   Lab Results  Component Value Date   CHOL 201 (H) 06/11/2020   HDL 46.10 06/11/2020   LDLCALC 124 (H) 06/11/2020   TRIG 155.0 (H) 06/11/2020   CHOLHDL 4 06/11/2020   Pt follows with GI  Dr. Havery Moros for IDA and is taking ferrous sulfate $RemoveBeforeD'325mg'EPaxlOEMNSfEHJ$  BID. EGD and colonoscopy done in 12/2018. Colo revealed AVMs, largest in cecum and smaller one in duodenum. Pt denies melena, hematochezia, hemoptysis. + fatigue - not new or worse than baseline. Some SOB w/  exertion x months. Echo done in 06/2020 - normal EF, mild MR, mild to mod AR.  Lab Results  Component Value Date   WBC 4.3 08/28/2020   HGB 10.8 (L) 08/28/2020   HCT 33.8 (L) 08/28/2020   MCV 81.9 08/28/2020   PLT 215.0 08/28/2020   Lab Results  Component Value Date   IRON 60 08/28/2020   TIBC 289 04/19/2019   FERRITIN 74.2 05/28/2020     Past Medical History:  Diagnosis Date  . Blood transfusion without reported diagnosis   . Cataract   . DM (diabetes mellitus) ([place])   . GERD (gastroesophageal reflux disease)   . Hyperlipidemia   . Hypertension   . Iron deficiency anemia   . NSCL ca dx'd 03/2018  . Thyroid disease     Past Surgical History:  Procedure Laterality Date  . CATARACT EXTRACTION Right   . CHOLECYSTECTOMY    . COLONOSCOPY  10/24/2009   normal rectum/1X1cm abnormal lesion in the ascending colon (bx benign). TI normal for 10cm.  Prep difficult/inadequate. f/u TCS 09/2012 recommended  . COLONOSCOPY  10/13/2004   Normal rectum/Diminutive polyps, splenic flexure, cold biopsied/removed.  Remainder of colonic mucosa appeared normal.  . COLONOSCOPY N/A 12/14/2012   JXB:JYNWGNF polyp-tubular adenoma  . ESOPHAGOGASTRODUODENOSCOPY  10/13/2004    Normal esophagus/ Nodular volcano like lesion in the antrum, either representing  a  pancreatic rest or leiomyoma, biopsied.  Remainder of the gastric mucosa appeared normal, normal D1-D2  . ESOPHAGOGASTRODUODENOSCOPY  10/24/2009   Benign biopsies. normal esophagus/small hiatal hernia/nodular lesion antrum/distal greater curvature. duodenal AVM s/p ablation  . GIVENS CAPSULE STUDY  07/27/2010    multiple arteriovenous malformations which could definitely be the contributor to her drifting hemoglobin and hematocrit  . TUBAL LIGATION    . VIDEO BRONCHOSCOPY WITH ENDOBRONCHIAL ULTRASOUND N/A 04/27/2018   Procedure: VIDEO BRONCHOSCOPY WITH ENDOBRONCHIAL ULTRASOUND;  Surgeon: Melrose Nakayama, MD;  Location: [facility] OR;  Service: Thoracic;   Laterality: N/A;    Social History   Socioeconomic History  . Marital status: Single    Spouse name: Not on file  . Number of children: Not on file  . Years of education: Not on file  . Highest education level: Not on file  Occupational History  . Not on file  Tobacco Use  . Smoking status: Former Smoker    Packs/day: 0.50    Years: 55.00    Pack years: 27.50    Types: Cigarettes    Quit date: 03/18/2018    Years since quitting: 2.4  . Smokeless tobacco: Never Used  . Tobacco comment: smoked off and n  Vaping Use  . Vaping Use: Never used  Substance and Sexual Activity  . Alcohol use: No  . Drug use: No  . Sexual activity: Not on file  Other Topics Concern  . Not on file  Social History Narrative   Patient fully vaccinated   Social Determinants of Health   Financial Resource Strain: Low Risk   . Difficulty of Paying Living Expenses: Not very hard  Food Insecurity: No Food Insecurity  . Worried About Charity fundraiser in the Last Year: Never true  . Ran Out of Food in the Last Year: Never true  Transportation Needs: No Transportation Needs  . Lack of Transportation (Medical): No  . Lack of Transportation (Non-Medical): No  Physical Activity: Not on file  Stress: Not on file  Social Connections: Not on file  Intimate Partner Violence: Not on file    Family History  Problem Relation Age of Onset  . Diabetes Sister   . Colon cancer Maternal Aunt        greater than age 39  . Breast cancer Cousin   . Amblyopia Neg Hx   . Blindness Neg Hx   . Cataracts Neg Hx   . Glaucoma Neg Hx   . Macular degeneration Neg Hx   . Retinal detachment Neg Hx   . Strabismus Neg Hx   . Retinitis pigmentosa Neg Hx   . Rectal cancer Neg Hx   . Stomach cancer Neg Hx   . Colon polyps Neg Hx   . Esophageal cancer Neg Hx      Immunization History  Administered Date(s) Administered  . Fluad Quad(high Dose 65+) 05/31/2019  . Influenza-Unspecified 04/16/2014  . PFIZER(Purple  Top)SARS-COV-2 Vaccination 11/22/2019, 12/17/2019  . Pneumococcal Conjugate-13 06/10/2014    Outpatient Encounter Medications as of 09/12/2020  Medication Sig  . atorvastatin (LIPITOR) 40 MG tablet Take 1 tablet (40 mg total) by mouth daily.  . blood glucose meter kit and supplies KIT Use up to four times daily as directed  . dapagliflozin propanediol (FARXIGA) 10 MG TABS tablet Take 1 tablet (10 mg total) by mouth daily before breakfast.  . Dexlansoprazole 30 MG capsule Take 1 capsule (30 mg total) by mouth daily.  . diphenhydrAMINE (BENADRYL) 2 %  cream Apply 1 application topically as needed for itching.  . docusate sodium (COLACE) 100 MG capsule Take 100 mg by mouth daily as needed for mild constipation.  . Ferrous Sulfate (IRON) 325 (65 Fe) MG TABS TAKE 1 TABLET BY MOUTH TWICE DAILY WITH A MEAL  . glipiZIDE (GLUCOTROL) 10 MG tablet Take one tablet by mouth two times daily  . insulin glargine (LANTUS SOLOSTAR) 100 UNIT/ML Solostar Pen Inject 10 Units into the skin every morning. And 31G pen needles 1/day  . labetalol (NORMODYNE) 100 MG tablet Take 0.5 tablets (50 mg total) by mouth 2 (two) times daily.  [name] levothyroxine (SYNTHROID) 75 MCG tablet Take 1 tablet (75 mcg total) by mouth daily.  [name] losartan (COZAAR) 25 MG tablet Take 1 tablet (25 mg total) by mouth daily.  . Multiple Vitamin (MULTIVITAMIN WITH MINERALS) TABS tablet Take 1 tablet by mouth at bedtime.   . Semaglutide (RYBELSUS) 3 MG TABS Take 3 mg by mouth daily. (Patient not taking: Reported on 09/12/2020)   Facility-Administered Encounter Medications as of 09/12/2020  Medication  . promethazine (PHENERGAN) injection 12.5 mg  . Tbo-Filgrastim (GRANIX) injection 300 mcg     ROS: Pertinent positives and negatives noted in HPI. Remainder of ROS non-contributory    Allergies  Allergen Reactions  . Paclitaxel Other (See Comments)    Unresponsiveness shortly after Taxol inf started 06/12/18.  . Aspirin Other (See Comments)     Stomach bleeding   . Esomeprazole Magnesium     UNSPECIFIED REACTION   . Ace Inhibitors Other (See Comments)    Dizziness, drunk like    BP (!) 142/66   Pulse 82   Temp (!) 97 F (36.1 C) (Temporal)   Ht 5\' 5"  (1.651 m)   Wt 182 lb 9.6 oz (82.8 kg)   SpO2 98%   BMI 30.39 kg/m   Wt Readings from Last 3 Encounters:  09/12/20 182 lb 9.6 oz (82.8 kg)  08/28/20 179 lb (81.2 kg)  07/29/20 183 lb 3.2 oz (83.1 kg)   Temp Readings from Last 3 Encounters:  09/12/20 (!) 97 F (36.1 C) (Temporal)  06/16/20 97.6 F (36.4 C) (Tympanic)  06/11/20 (!) 97.5 F (36.4 C) (Temporal)   BP Readings from Last 3 Encounters:  09/12/20 (!) 142/66  08/28/20 (!) 142/86  07/29/20 (!) 160/70   Pulse Readings from Last 3 Encounters:  09/12/20 82  08/28/20 93  06/16/20 84    Physical Exam Constitutional:      General: She is not in acute distress.    Appearance: Normal appearance. She is not ill-appearing.  Cardiovascular:     Rate and Rhythm: Normal rate and regular rhythm.     Pulses: Normal pulses.  Pulmonary:     Effort: Pulmonary effort is normal. No respiratory distress.     Breath sounds: Normal breath sounds. No wheezing or rhonchi.  Abdominal:     General: Bowel sounds are normal. There is no distension.     Palpations: Abdomen is soft.     Tenderness: There is no abdominal tenderness.  Musculoskeletal:     Right lower leg: No edema.     Left lower leg: No edema.  Neurological:     Mental Status: She is alert and oriented to person, place, and time.  Psychiatric:        Mood and Affect: Mood normal.        Behavior: Behavior normal.      A/P:  1. Primary hypertension - SBP not at  goal Increase: - losartan (COZAAR) 50 MG tablet; Take 1 tablet (50 mg total) by mouth daily.  Dispense: 90 tablet; Refill: 3 - increased from $RemoveBefore'25mg'EcLRdIbnMfkWC$  daily - cont labetalol $RemoveBefore'50mg'UWSQEfqxuGRNu$  BID - low sodium diet, and encouraged her to continue walking   2. Hypothyroidism (acquired) - on levothyroxine  63mcg daily, recently decreased from 147mcg - pt had f/u endo next month  3. Hyperlipidemia, unspecified hyperlipidemia type - on lipitor $RemoveBe'40mg'PFEAWUdVQ$  daily, switching from simvastatin in 05/2020 - Lipid panel - AST - ALT  4. Uncontrolled type 2 diabetes mellitus with hyperglycemia ([place]) - follows with endo Dr. Loanne Drilling and at most recent appt lantus 10 units daily was added - she has f/u next month  This visit occurred during the SARS-CoV-2 public health emergency.  Safety protocols were in place, including screening questions prior to the visit, additional usage of staff PPE, and extensive cleaning of exam room while observing appropriate contact time as indicated for disinfecting solutions.

## 2020-09-29 NOTE — Progress Notes (Signed)
Subjective:   Jenna [name] is a 75 y.o. female who presents for Medicare Annual (Subsequent) preventive examination.  I connected with Jeremie today by telephone and verified that I am speaking with the correct person using two identifiers. Location patient: home Location provider: work Persons participating in the virtual visit: patient, Marine scientist.    I discussed the limitations, risks, security and privacy concerns of performing an evaluation and management service by telephone and the availability of in person appointments. I also discussed with the patient that there may be a patient responsible charge related to this service. The patient expressed understanding and verbally consented to this telephonic visit.    Interactive audio and video telecommunications were attempted between this provider and patient, however failed, due to patient having technical difficulties OR patient did not have access to video capability.  We continued and completed visit with audio only.  Some vital signs may be absent or patient reported.   Time Spent with patient on telephone encounter: 30 minutes   Review of Systems     Cardiac Risk Factors include: advanced age (>51men, >45 women);diabetes mellitus;dyslipidemia;hypertension;obesity (BMI >30kg/m2);sedentary lifestyle     Objective:    Today's Vitals   09/30/20 1030  Weight: 182 lb (82.6 kg)  Height: $Remove'5\' 5"'tNJiUtb$  (1.651 m)   Body mass index is 30.29 kg/m.  Advanced Directives 09/30/2020 04/14/2020 02/14/2020 09/26/2019 07/26/2019 03/22/2019 11/02/2018  Does Patient Have a Medical Advance Directive? No No No No No No No  Would patient like information on creating a medical advance directive? Yes (MAU/Ambulatory/Procedural Areas - Information given) Yes (MAU/Ambulatory/Procedural Areas - Information given) No - Patient declined No - Patient declined - No - Patient declined -    Current Medications (verified) Outpatient Encounter Medications as of  09/30/2020  Medication Sig  . atorvastatin (LIPITOR) 40 MG tablet Take 1 tablet (40 mg total) by mouth daily.  . blood glucose meter kit and supplies KIT Use up to four times daily as directed  . dapagliflozin propanediol (FARXIGA) 10 MG TABS tablet Take 1 tablet (10 mg total) by mouth daily before breakfast.  . Dexlansoprazole 30 MG capsule Take 1 capsule (30 mg total) by mouth daily.  . diphenhydrAMINE (BENADRYL) 2 % cream Apply 1 application topically as needed for itching.  . docusate sodium (COLACE) 100 MG capsule Take 100 mg by mouth daily as needed for mild constipation.  . Ferrous Sulfate (IRON) 325 (65 Fe) MG TABS TAKE 1 TABLET BY MOUTH TWICE DAILY WITH A MEAL  . glipiZIDE (GLUCOTROL) 10 MG tablet Take one tablet by mouth two times daily  . insulin glargine (LANTUS SOLOSTAR) 100 UNIT/ML Solostar Pen Inject 10 Units into the skin every morning. And 31G pen needles 1/day  . labetalol (NORMODYNE) 100 MG tablet Take 0.5 tablets (50 mg total) by mouth 2 (two) times daily.  [name] levothyroxine (SYNTHROID) 75 MCG tablet Take 1 tablet (75 mcg total) by mouth daily.  [name] losartan (COZAAR) 50 MG tablet Take 1 tablet (50 mg total) by mouth daily.  . Multiple Vitamin (MULTIVITAMIN WITH MINERALS) TABS tablet Take 1 tablet by mouth at bedtime.   . Semaglutide (RYBELSUS) 3 MG TABS Take 3 mg by mouth daily. (Patient not taking: No sig reported)   Facility-Administered Encounter Medications as of 09/30/2020  Medication  . promethazine (PHENERGAN) injection 12.5 mg  . Tbo-Filgrastim (GRANIX) injection 300 mcg    Allergies (verified) Paclitaxel, Aspirin, Esomeprazole magnesium, and Ace inhibitors   History: Past Medical History:  Diagnosis Date  .  Blood transfusion without reported diagnosis   . Cataract   . DM (diabetes mellitus) ([place])   . GERD (gastroesophageal reflux disease)   . Hyperlipidemia   . Hypertension   . Iron deficiency anemia   . NSCL ca dx'd 03/2018  . Thyroid disease    Past  Surgical History:  Procedure Laterality Date  . CATARACT EXTRACTION Right   . CHOLECYSTECTOMY    . COLONOSCOPY  10/24/2009   normal rectum/1X1cm abnormal lesion in the ascending colon (bx benign). TI normal for 10cm.  Prep difficult/inadequate. f/u TCS 09/2012 recommended  . COLONOSCOPY  10/13/2004   Normal rectum/Diminutive polyps, splenic flexure, cold biopsied/removed.  Remainder of colonic mucosa appeared normal.  . COLONOSCOPY N/A 12/14/2012   XTK:WIOXBDZ polyp-tubular adenoma  . ESOPHAGOGASTRODUODENOSCOPY  10/13/2004    Normal esophagus/ Nodular volcano like lesion in the antrum, either representing a  pancreatic rest or leiomyoma, biopsied.  Remainder of the gastric mucosa appeared normal, normal D1-D2  . ESOPHAGOGASTRODUODENOSCOPY  10/24/2009   Benign biopsies. normal esophagus/small hiatal hernia/nodular lesion antrum/distal greater curvature. duodenal AVM s/p ablation  . GIVENS CAPSULE STUDY  07/27/2010    multiple arteriovenous malformations which could definitely be the contributor to her drifting hemoglobin and hematocrit  . TUBAL LIGATION    . VIDEO BRONCHOSCOPY WITH ENDOBRONCHIAL ULTRASOUND N/A 04/27/2018   Procedure: VIDEO BRONCHOSCOPY WITH ENDOBRONCHIAL ULTRASOUND;  Surgeon: Melrose Nakayama, MD;  Location: [facility] OR;  Service: Thoracic;  Laterality: N/A;   Family History  Problem Relation Age of Onset  . Diabetes Sister   . Colon cancer Maternal Aunt        greater than age 68  . Breast cancer Cousin   . Amblyopia Neg Hx   . Blindness Neg Hx   . Cataracts Neg Hx   . Glaucoma Neg Hx   . Macular degeneration Neg Hx   . Retinal detachment Neg Hx   . Strabismus Neg Hx   . Retinitis pigmentosa Neg Hx   . Rectal cancer Neg Hx   . Stomach cancer Neg Hx   . Colon polyps Neg Hx   . Esophageal cancer Neg Hx    Social History   Socioeconomic History  . Marital status: Single    Spouse name: Not on file  . Number of children: Not on file  . Years of education: Not on  file  . Highest education level: Not on file  Occupational History  . Not on file  Tobacco Use  . Smoking status: Former Smoker    Packs/day: 0.50    Years: 55.00    Pack years: 27.50    Types: Cigarettes    Quit date: 03/18/2018    Years since quitting: 2.5  . Smokeless tobacco: Never Used  . Tobacco comment: smoked off and n  Vaping Use  . Vaping Use: Never used  Substance and Sexual Activity  . Alcohol use: No  . Drug use: No  . Sexual activity: Not on file  Other Topics Concern  . Not on file  Social History Narrative   Patient fully vaccinated   Social Determinants of Health   Financial Resource Strain: Low Risk   . Difficulty of Paying Living Expenses: Not hard at all  Food Insecurity: No Food Insecurity  . Worried About Charity fundraiser in the Last Year: Never true  . Ran Out of Food in the Last Year: Never true  Transportation Needs: No Transportation Needs  . Lack of Transportation (Medical): No  . Lack  of Transportation (Non-Medical): No  Physical Activity: Inactive  . Days of Exercise per Week: 0 days  . Minutes of Exercise per Session: 0 min  Stress: No Stress Concern Present  . Feeling of Stress : Not at all  Social Connections: Socially Isolated  . Frequency of Communication with Friends and Family: More than three times a week  . Frequency of Social Gatherings with Friends and Family: More than three times a week  . Attends Religious Services: Never  . Active Member of Clubs or Organizations: No  . Attends Archivist Meetings: Never  . Marital Status: Never married    Tobacco Counseling Counseling given: Not Answered Comment: smoked off and n   Clinical Intake:  Pre-visit preparation completed: Yes  Pain : No/denies pain     Nutritional Status: BMI > 30  Obese Nutritional Risks: None Diabetes: Yes CBG done?: No Did pt. bring in CBG monitor from home?: No  How often do you need to have someone help you when you read  instructions, pamphlets, or other written materials from your doctor or pharmacy?: 1 - Never  Diabetes:  Is the patient diabetic?  Yes  If diabetic, was a CBG obtained today?  No  Did the patient bring in their glucometer from home?  No phone visit How often do you monitor your CBG's? never.   Financial Strains and Diabetes Management:  Are you having any financial strains with the device, your supplies or your medication? No .  Does the patient want to be seen by Chronic Care Management for management of their diabetes?  No  Would the patient like to be referred to a Nutritionist or for Diabetic Management?  No   Diabetic Exams:  Diabetic Eye Exam: . Overdue for diabetic eye exam. Pt has been advised about the importance in completing this exam. A referral has been placed today.Advised pt to expect a call from our office re: appt.  Diabetic Foot Exam: Completed 08/28/2020.    Interpreter Needed?: No  Information entered by :: Caroleen Hamman LPN   Activities of Daily Living In your present state of health, do you have any difficulty performing the following activities: 09/30/2020  Hearing? N  Vision? N  Difficulty concentrating or making decisions? N  Walking or climbing stairs? N  Dressing or bathing? N  Doing errands, shopping? N  Preparing Food and eating ? N  Using the Toilet? N  In the past six months, have you accidently leaked urine? N  Do you have problems with loss of bowel control? N  Managing your Medications? N  Managing your Finances? N  Housekeeping or managing your Housekeeping? N  Some recent data might be hidden    Patient Care Team: Ronnald Nian, DO as PCP - General (Family Medicine) Gala Romney, Cristopher Estimable, MD (Gastroenterology)  Indicate any recent Medical Services you may have received from other than [facility] providers in the past year (date may be approximate).     Assessment:   This is a routine wellness examination for Ramya.  Hearing/Vision  screen  Hearing Screening   '125Hz'$  $Remo'250Hz'pcquD$'500Hz'$'1000Hz'$'2000Hz'$'3000Hz'$'4000Hz'$'6000Hz'$'8000Hz'$   Right ear:           Left ear:           Comments: No issues  Vision Screening Comments: Last eye exam-2020  Dietary issues and exercise activities discussed: Current Exercise Habits: The patient does not participate in regular exercise at present, Exercise limited by: None identified  Goals    . Increase physical activity    . Patient Stated     Eat healthier      Depression Screen PHQ 2/9 Scores 09/30/2020 04/14/2020 09/26/2019 06/11/2019 03/20/2018  PHQ - 2 Score 0 0 0 0 0    Fall Risk Fall Risk  09/30/2020 04/14/2020 09/26/2019 03/20/2018  Falls in the past year? 0 0 0 No  Number falls in past yr: 0 - 0 -  Injury with Fall? 0 - 0 -  Follow up Falls prevention discussed - Education provided;Falls prevention discussed -    FALL RISK PREVENTION PERTAINING TO THE HOME:  Any stairs in or around the home? Yes  If so, are there any without handrails? No  Home free of loose throw rugs in walkways, pet beds, electrical cords, etc? Yes  Adequate lighting in your home to reduce risk of falls? Yes   ASSISTIVE DEVICES UTILIZED TO PREVENT FALLS:  Life alert? No  Use of a cane, walker or w/c? No  Grab bars in the bathroom? No  Shower chair or bench in shower? No  Elevated toilet seat or a handicapped toilet? No   TIMED UP AND GO:  Was the test performed? No . Phone visit   Cognitive Function:Normal cognitive status assessed by this Nurse Health Advisor. No abnormalities found.          Immunizations Immunization History  Administered Date(s) Administered  . Fluad Quad(high Dose 65+) 05/31/2019, 09/12/2020  . Influenza-Unspecified 04/16/2014  . PFIZER(Purple Top)SARS-COV-2 Vaccination 11/22/2019, 12/17/2019  . Pneumococcal Conjugate-13 06/10/2014    TDAP status: Due, Education has been provided regarding the importance of this vaccine. Advised may receive this vaccine at local pharmacy  or Health Dept. Aware to provide a copy of the vaccination record if obtained from local pharmacy or Health Dept. Verbalized acceptance and understanding.  Flu Vaccine status: Up to date  Pneumococcal vaccine status: Up to date Patient states she has had both vaccines. No date available for the Pneumovax-23.  Covid-19 vaccine status: Completed vaccines  Qualifies for Shingles Vaccine? Yes   Zostavax completed No   Shingrix Completed?: No.    Education has been provided regarding the importance of this vaccine. Patient has been advised to call insurance company to determine out of pocket expense if they have not yet received this vaccine. Advised may also receive vaccine at local pharmacy or Health Dept. Verbalized acceptance and understanding.  Screening Tests Health Maintenance  Topic Date Due  . Hepatitis C Screening  Never done  . OPHTHALMOLOGY EXAM  Never done  . TETANUS/TDAP  Never done  . DEXA SCAN  Never done  . PNA vac Low Risk Adult (2 of 2 - PPSV23) 06/11/2015  . COVID-19 Vaccine (3 - Pfizer risk 4-dose series) 01/14/2020  . HEMOGLOBIN A1C  02/25/2021  . FOOT EXAM  08/28/2021  . COLONOSCOPY (Pts 45-48yrs Insurance coverage will need to be confirmed)  12/18/2021  . MAMMOGRAM  03/21/2022  . INFLUENZA VACCINE  Completed    Health Maintenance  Health Maintenance Due  Topic Date Due  . Hepatitis C Screening  Never done  . OPHTHALMOLOGY EXAM  Never done  . TETANUS/TDAP  Never done  . DEXA SCAN  Never done  . PNA vac Low Risk Adult (2 of 2 - PPSV23) 06/11/2015  . COVID-19 Vaccine (3 - Pfizer risk 4-dose series) 01/14/2020    Colorectal cancer screening: Type of screening: Colonoscopy. Completed 12/19/2018. Repeat every 3 years  Mammogram status: Completed 03/21/2020. Repeat  every year  Bone Density status: Ordered today. Pt provided with contact info and advised to call to schedule appt.  Lung Cancer Screening: (Low Dose CT Chest recommended if Age 28-80 years, 30  pack-year currently smoking OR have quit w/in 15years.) does not qualify.    Additional Screening:  Hepatitis C Screening: does qualify; Discuss with PCP  Vision Screening: Recommended annual ophthalmology exams for early detection of glaucoma and other disorders of the eye. Is the patient up to date with their annual eye exam?  No  Who is the provider or what is the name of the office in which the patient attends annual eye exams? unknown If pt is not established with a provider, would they like to be referred to a provider to establish care? Yes .   Dental Screening: Recommended annual dental exams for proper oral hygiene  Community Resource Referral / Chronic Care Management: CRR required this visit?  No   CCM required this visit?  No      Plan:     I have personally reviewed and noted the following in the patient's chart:   . Medical and social history . Use of alcohol, tobacco or illicit drugs  . Current medications and supplements . Functional ability and status . Nutritional status . Physical activity . Advanced directives . List of other physicians . Hospitalizations, surgeries, and ER visits in previous 12 months . Vitals . Screenings to include cognitive, depression, and falls . Referrals and appointments  In addition, I have reviewed and discussed with patient certain preventive protocols, quality metrics, and best practice recommendations. A written personalized care plan for preventive services as well as general preventive health recommendations were provided to patient.   Due to this being a telephonic visit, the after visit summary with patients personalized plan was offered to patient via mail or my-chart. Per request, patient was mailed a copy of [place], LPN   3/34/3568  Nurse Health Advisor  Nurse Notes: None

## 2020-09-30 ENCOUNTER — Ambulatory Visit (INDEPENDENT_AMBULATORY_CARE_PROVIDER_SITE_OTHER): Payer: Medicare Other

## 2020-09-30 VITALS — Ht 65.0 in | Wt 182.0 lb

## 2020-09-30 DIAGNOSIS — Z0000 Encounter for general adult medical examination without abnormal findings: Secondary | ICD-10-CM

## 2020-09-30 DIAGNOSIS — Z780 Asymptomatic menopausal state: Secondary | ICD-10-CM | POA: Diagnosis not present

## 2020-09-30 DIAGNOSIS — E119 Type 2 diabetes mellitus without complications: Secondary | ICD-10-CM | POA: Diagnosis not present

## 2020-09-30 NOTE — Patient Instructions (Signed)
Jenna [name] , Thank you for taking time to complete your Medicare Wellness Visit. I appreciate your ongoing commitment to your health goals. Please review the following plan we discussed and let me know if I can assist you in the future.   Screening recommendations/referrals: Colonoscopy: Completed 12/19/2018-Please follow the recommendation from the GI doctor for follow up. Mammogram: Completed 03/21/2020-Due 03/21/2021 Bone Density: Ordered today. Someone will be calling you to schedule. Recommended yearly ophthalmology/optometry visit for glaucoma screening and checkup Recommended yearly dental visit for hygiene and checkup  Vaccinations: Influenza vaccine: Up to date Pneumococcal vaccine: Completed vaccines Tdap vaccine: Discuss with pharmacy Shingles vaccine: Declined   Covid-19:Completed vaccines  Advanced directives: Information mailed today.  Conditions/risks identified: See problem list  Next appointment: Follow up in one year for your annual wellness visit 10/06/2021 @ 10:30   Preventive Care 65 Years and Older, Female Preventive care refers to lifestyle choices and visits with your health care provider that can promote health and wellness. What does preventive care include?  A yearly physical exam. This is also called an annual well check.  Dental exams once or twice a year.  Routine eye exams. Ask your health care provider how often you should have your eyes checked.  Personal lifestyle choices, including:  Daily care of your teeth and gums.  Regular physical activity.  Eating a healthy diet.  Avoiding tobacco and drug use.  Limiting alcohol use.  Practicing safe sex.  Taking low-dose aspirin every day.  Taking vitamin and mineral supplements as recommended by your health care provider. What happens during an annual well check? The services and screenings done by your health care provider during your annual well check will depend on your age, overall health,  lifestyle risk factors, and family history of disease. Counseling  Your health care provider may ask you questions about your:  Alcohol use.  Tobacco use.  Drug use.  Emotional well-being.  Home and relationship well-being.  Sexual activity.  Eating habits.  History of falls.  Memory and ability to understand (cognition).  Work and work Statistician.  Reproductive health. Screening  You may have the following tests or measurements:  Height, weight, and BMI.  Blood pressure.  Lipid and cholesterol levels. These may be checked every 5 years, or more frequently if you are over 4 years old.  Skin check.  Lung cancer screening. You may have this screening every year starting at age 62 if you have a 30-pack-year history of smoking and currently smoke or have quit within the past 15 years.  Fecal occult blood test (FOBT) of the stool. You may have this test every year starting at age 73.  Flexible sigmoidoscopy or colonoscopy. You may have a sigmoidoscopy every 5 years or a colonoscopy every 10 years starting at age 24.  Hepatitis C blood test.  Hepatitis B blood test.  Sexually transmitted disease (STD) testing.  Diabetes screening. This is done by checking your blood sugar (glucose) after you have not eaten for a while (fasting). You may have this done every 1-3 years.  Bone density scan. This is done to screen for osteoporosis. You may have this done starting at age 100.  Mammogram. This may be done every 1-2 years. Talk to your health care provider about how often you should have regular mammograms. Talk with your health care provider about your test results, treatment options, and if necessary, the need for more tests. Vaccines  Your health care provider may recommend certain vaccines, such as:  Influenza vaccine. This is recommended every year.  Tetanus, diphtheria, and acellular pertussis (Tdap, Td) vaccine. You may need a Td booster every 10 years.  Zoster  vaccine. You may need this after age 61.  Pneumococcal 13-valent conjugate (PCV13) vaccine. One dose is recommended after age 52.  Pneumococcal polysaccharide (PPSV23) vaccine. One dose is recommended after age 66. Talk to your health care provider about which screenings and vaccines you need and how often you need them. This information is not intended to replace advice given to you by your health care provider. Make sure you discuss any questions you have with your health care provider. Document Released: 08/29/2015 Document Revised: 04/21/2016 Document Reviewed: 06/03/2015 Elsevier Interactive Patient Education  2017 [employer] Prevention in the Home Falls can cause injuries. They can happen to people of all ages. There are many things you can do to make your home safe and to help prevent falls. What can I do on the outside of my home?  Regularly fix the edges of walkways and driveways and fix any cracks.  Remove anything that might make you trip as you walk through a door, such as a raised step or threshold.  Trim any bushes or trees on the path to your home.  Use bright outdoor lighting.  Clear any walking paths of anything that might make someone trip, such as rocks or tools.  Regularly check to see if handrails are loose or broken. Make sure that both sides of any steps have handrails.  Any raised decks and porches should have guardrails on the edges.  Have any leaves, snow, or ice cleared regularly.  Use sand or salt on walking paths during winter.  Clean up any spills in your garage right away. This includes oil or grease spills. What can I do in the bathroom?  Use night lights.  Install grab bars by the toilet and in the tub and shower. Do not use towel bars as grab bars.  Use non-skid mats or decals in the tub or shower.  If you need to sit down in the shower, use a plastic, non-slip stool.  Keep the floor dry. Clean up any water that spills on the  floor as soon as it happens.  Remove soap buildup in the tub or shower regularly.  Attach bath mats securely with double-sided non-slip rug tape.  Do not have throw rugs and other things on the floor that can make you trip. What can I do in the bedroom?  Use night lights.  Make sure that you have a light by your bed that is easy to reach.  Do not use any sheets or blankets that are too big for your bed. They should not hang down onto the floor.  Have a firm chair that has side arms. You can use this for support while you get dressed.  Do not have throw rugs and other things on the floor that can make you trip. What can I do in the kitchen?  Clean up any spills right away.  Avoid walking on wet floors.  Keep items that you use a lot in easy-to-reach places.  If you need to reach something above you, use a strong step stool that has a grab bar.  Keep electrical cords out of the way.  Do not use floor polish or wax that makes floors slippery. If you must use wax, use non-skid floor wax.  Do not have throw rugs and other things on the floor that  can make you trip. What can I do with my stairs?  Do not leave any items on the stairs.  Make sure that there are handrails on both sides of the stairs and use them. Fix handrails that are broken or loose. Make sure that handrails are as long as the stairways.  Check any carpeting to make sure that it is firmly attached to the stairs. Fix any carpet that is loose or worn.  Avoid having throw rugs at the top or bottom of the stairs. If you do have throw rugs, attach them to the floor with carpet tape.  Make sure that you have a light switch at the top of the stairs and the bottom of the stairs. If you do not have them, ask someone to add them for you. What else can I do to help prevent falls?  Wear shoes that:  Do not have high heels.  Have rubber bottoms.  Are comfortable and fit you well.  Are closed at the toe. Do not wear  sandals.  If you use a stepladder:  Make sure that it is fully opened. Do not climb a closed stepladder.  Make sure that both sides of the stepladder are locked into place.  Ask someone to hold it for you, if possible.  Clearly mark and make sure that you can see:  Any grab bars or handrails.  First and last steps.  Where the edge of each step is.  Use tools that help you move around (mobility aids) if they are needed. These include:  Canes.  Walkers.  Scooters.  Crutches.  Turn on the lights when you go into a dark area. Replace any light bulbs as soon as they burn out.  Set up your furniture so you have a clear path. Avoid moving your furniture around.  If any of your floors are uneven, fix them.  If there are any pets around you, be aware of where they are.  Review your medicines with your doctor. Some medicines can make you feel dizzy. This can increase your chance of falling. Ask your doctor what other things that you can do to help prevent falls. This information is not intended to replace advice given to you by your health care provider. Make sure you discuss any questions you have with your health care provider. Document Released: 05/29/2009 Document Revised: 01/08/2016 Document Reviewed: 09/06/2014 Elsevier Interactive Patient Education  2017 [employer].

## 2020-10-02 ENCOUNTER — Telehealth: Payer: Medicare Other | Admitting: Endocrinology

## 2020-10-14 ENCOUNTER — Other Ambulatory Visit: Payer: Self-pay | Admitting: Gastroenterology

## 2020-10-15 ENCOUNTER — Telehealth: Payer: Self-pay

## 2020-10-15 NOTE — Telephone Encounter (Signed)
Pt calling to inform our office that the [facility] transportation said that they will not be able to transfer her to her eye doctor appointment that she was referred to.  Please advise what the pt should do.  [phone]

## 2020-10-16 ENCOUNTER — Telehealth: Payer: Self-pay

## 2020-10-16 NOTE — Telephone Encounter (Signed)
PA for 30 mg Dexilant once daily submitted via CoverMyMeds:  Key: [id number] - PA Case ID: [id number] - Rx #: [id number]

## 2020-10-17 ENCOUNTER — Encounter: Payer: Self-pay | Admitting: Family Medicine

## 2020-10-17 NOTE — Telephone Encounter (Signed)
Called and spoke to patient. She indicated that she was able to pick up a 90 day supply from [facility] of the 30 mg Dexilant and it didn't cost her anything.  We will see if she can get this going forward in spite of the fact that [facility] denied the PA

## 2020-10-17 NOTE — Telephone Encounter (Signed)
Appointment scheduled with in network ophthalmology. Dr. Melissa Noon [facility] 12/19/2020 2:15pm  Pt has been advised and agreed to appt. This should be in network and covered under [employer]. Letter mailed with details of all upcoming appts per pt request.

## 2020-10-17 NOTE — Telephone Encounter (Signed)
PA denied for Dexilant 30 mg qd. Not covered by Medicare Part D.

## 2020-10-21 NOTE — Telephone Encounter (Signed)
Pt called 10/21/20, and is confused about where to go for her eye appointment she said [facility] just called her to confirm her eye appt but she thought someone here had cancelled that for her. I relayed the info in this thread regarding her in network appointment. She is asking a call back from

## 2020-11-25 ENCOUNTER — Other Ambulatory Visit: Payer: Self-pay

## 2020-11-25 DIAGNOSIS — D509 Iron deficiency anemia, unspecified: Secondary | ICD-10-CM

## 2020-11-25 NOTE — Progress Notes (Signed)
Cbc due end of April for IDA

## 2020-12-03 ENCOUNTER — Telehealth: Payer: Self-pay

## 2020-12-03 NOTE — Telephone Encounter (Signed)
-----   Message from Roetta Sessions, [place] sent at 11/25/2020  2:18 PM EDT ----- Regarding: FW: needs CBC  ----- Message ----- From: Roetta Sessions, CMA Sent: 11/25/2020  12:00 AM EDT To: Roetta Sessions, CMA Subject: needs CBC                                      Repeat CBC in late April or early May 2022

## 2020-12-03 NOTE — Telephone Encounter (Signed)
Called and spoke to patient. She is scheduled to have lab work next Friday 4-29 for the [facility]. Will check results on Monday and advise Dr. Havery Moros

## 2020-12-12 ENCOUNTER — Ambulatory Visit (HOSPITAL_COMMUNITY)
Admission: RE | Admit: 2020-12-12 | Discharge: 2020-12-12 | Disposition: A | Discharge status: Home / Self Care | Payer: Medicare Other | Source: Ambulatory Visit | Attending: Internal Medicine | Admitting: Internal Medicine

## 2020-12-12 ENCOUNTER — Inpatient Hospital Stay: Payer: Medicare Other | Attending: Internal Medicine

## 2020-12-12 ENCOUNTER — Encounter (HOSPITAL_COMMUNITY): Payer: Self-pay

## 2020-12-12 ENCOUNTER — Other Ambulatory Visit: Payer: Self-pay

## 2020-12-12 DIAGNOSIS — C3490 Malignant neoplasm of unspecified part of unspecified bronchus or lung: Secondary | ICD-10-CM | POA: Insufficient documentation

## 2020-12-12 DIAGNOSIS — J984 Other disorders of lung: Secondary | ICD-10-CM | POA: Diagnosis not present

## 2020-12-12 DIAGNOSIS — J8410 Pulmonary fibrosis, unspecified: Secondary | ICD-10-CM | POA: Diagnosis not present

## 2020-12-12 DIAGNOSIS — I2510 Atherosclerotic heart disease of native coronary artery without angina pectoris: Secondary | ICD-10-CM | POA: Diagnosis not present

## 2020-12-12 LAB — CMP (CANCER CENTER ONLY)
ALT: 21 U/L (ref 0–44)
AST: 19 U/L (ref 15–41)
Albumin: 3.7 g/dL (ref 3.5–5.0)
Alkaline Phosphatase: 115 U/L (ref 38–126)
Anion gap: 10 (ref 5–15)
BUN: 23 mg/dL (ref 8–23)
CO2: 22 mmol/L (ref 22–32)
Calcium: 9.1 mg/dL (ref 8.9–10.3)
Chloride: 109 mmol/L (ref 98–111)
Creatinine: 1.38 mg/dL — ABNORMAL HIGH (ref 0.44–1.00)
GFR, Estimated: 40 mL/min — ABNORMAL LOW (ref >60)
Glucose, Bld: 260 mg/dL — ABNORMAL HIGH (ref 70–99)
Potassium: 4 mmol/L (ref 3.5–5.1)
Sodium: 141 mmol/L (ref 135–145)
Total Bilirubin: 0.3 mg/dL (ref 0.3–1.2)
Total Protein: 7.9 g/dL (ref 6.5–8.1)

## 2020-12-12 LAB — CBC WITH DIFFERENTIAL (CANCER CENTER ONLY)
Abs Immature Granulocytes: 0.02 10*3/uL (ref 0.00–0.07)
Basophils Absolute: 0 10*3/uL (ref 0.0–0.1)
Basophils Relative: 0 %
Eosinophils Absolute: 0.1 10*3/uL (ref 0.0–0.5)
Eosinophils Relative: 2 %
HCT: 33.6 % — ABNORMAL LOW (ref 36.0–46.0)
Hemoglobin: 10.1 g/dL — ABNORMAL LOW (ref 12.0–15.0)
Immature Granulocytes: 0 %
Lymphocytes Relative: 9 %
Lymphs Abs: 0.4 10*3/uL — ABNORMAL LOW (ref 0.7–4.0)
MCH: 25.9 pg — ABNORMAL LOW (ref 26.0–34.0)
MCHC: 30.1 g/dL (ref 30.0–36.0)
MCV: 86.2 fL (ref 80.0–100.0)
Monocytes Absolute: 0.2 10*3/uL (ref 0.1–1.0)
Monocytes Relative: 5 %
Neutro Abs: 3.8 10*3/uL (ref 1.7–7.7)
Neutrophils Relative %: 84 %
Platelet Count: 206 10*3/uL (ref 150–400)
RBC: 3.9 MIL/uL (ref 3.87–5.11)
RDW: 16.4 % — ABNORMAL HIGH (ref 11.5–15.5)
WBC Count: 4.6 10*3/uL (ref 4.0–10.5)
nRBC: 0 % (ref 0.0–0.2)

## 2020-12-15 ENCOUNTER — Inpatient Hospital Stay (HOSPITAL_COMMUNITY)
Admission: EM | Admit: 2020-12-15 | Discharge: 2020-12-17 | DRG: 871 | Disposition: A | Discharge status: Home / Self Care | Payer: Medicare Other | Attending: Internal Medicine | Admitting: Internal Medicine

## 2020-12-15 ENCOUNTER — Emergency Department (HOSPITAL_COMMUNITY): Payer: Medicare Other

## 2020-12-15 ENCOUNTER — Telehealth: Payer: Self-pay | Admitting: Internal Medicine

## 2020-12-15 ENCOUNTER — Other Ambulatory Visit: Payer: Self-pay

## 2020-12-15 ENCOUNTER — Ambulatory Visit: Payer: Medicare Other | Admitting: Internal Medicine

## 2020-12-15 ENCOUNTER — Encounter (HOSPITAL_COMMUNITY): Payer: Self-pay | Admitting: Emergency Medicine

## 2020-12-15 DIAGNOSIS — Z85118 Personal history of other malignant neoplasm of bronchus and lung: Secondary | ICD-10-CM | POA: Diagnosis not present

## 2020-12-15 DIAGNOSIS — Z743 Need for continuous supervision: Secondary | ICD-10-CM | POA: Diagnosis not present

## 2020-12-15 DIAGNOSIS — Z87891 Personal history of nicotine dependence: Secondary | ICD-10-CM | POA: Diagnosis not present

## 2020-12-15 DIAGNOSIS — Z888 Allergy status to other drugs, medicaments and biological substances status: Secondary | ICD-10-CM | POA: Diagnosis not present

## 2020-12-15 DIAGNOSIS — I129 Hypertensive chronic kidney disease with stage 1 through stage 4 chronic kidney disease, or unspecified chronic kidney disease: Secondary | ICD-10-CM | POA: Diagnosis not present

## 2020-12-15 DIAGNOSIS — Z20822 Contact with and (suspected) exposure to covid-19: Secondary | ICD-10-CM | POA: Diagnosis not present

## 2020-12-15 DIAGNOSIS — R059 Cough, unspecified: Secondary | ICD-10-CM | POA: Diagnosis present

## 2020-12-15 DIAGNOSIS — E1122 Type 2 diabetes mellitus with diabetic chronic kidney disease: Secondary | ICD-10-CM | POA: Diagnosis not present

## 2020-12-15 DIAGNOSIS — R911 Solitary pulmonary nodule: Secondary | ICD-10-CM | POA: Diagnosis not present

## 2020-12-15 DIAGNOSIS — Z79890 Hormone replacement therapy (postmenopausal): Secondary | ICD-10-CM

## 2020-12-15 DIAGNOSIS — E118 Type 2 diabetes mellitus with unspecified complications: Secondary | ICD-10-CM

## 2020-12-15 DIAGNOSIS — Z86010 Personal history of colonic polyps: Secondary | ICD-10-CM

## 2020-12-15 DIAGNOSIS — D509 Iron deficiency anemia, unspecified: Secondary | ICD-10-CM | POA: Diagnosis present

## 2020-12-15 DIAGNOSIS — R000 Tachycardia, unspecified: Secondary | ICD-10-CM | POA: Diagnosis not present

## 2020-12-15 DIAGNOSIS — Z7984 Long term (current) use of oral hypoglycemic drugs: Secondary | ICD-10-CM | POA: Diagnosis not present

## 2020-12-15 DIAGNOSIS — E785 Hyperlipidemia, unspecified: Secondary | ICD-10-CM | POA: Diagnosis present

## 2020-12-15 DIAGNOSIS — J189 Pneumonia, unspecified organism: Secondary | ICD-10-CM | POA: Diagnosis present

## 2020-12-15 DIAGNOSIS — Z9841 Cataract extraction status, right eye: Secondary | ICD-10-CM | POA: Diagnosis not present

## 2020-12-15 DIAGNOSIS — K219 Gastro-esophageal reflux disease without esophagitis: Secondary | ICD-10-CM | POA: Diagnosis not present

## 2020-12-15 DIAGNOSIS — Z800 Family history of malignant neoplasm of digestive organs: Secondary | ICD-10-CM

## 2020-12-15 DIAGNOSIS — E119 Type 2 diabetes mellitus without complications: Secondary | ICD-10-CM | POA: Diagnosis not present

## 2020-12-15 DIAGNOSIS — Z794 Long term (current) use of insulin: Secondary | ICD-10-CM | POA: Diagnosis not present

## 2020-12-15 DIAGNOSIS — I10 Essential (primary) hypertension: Secondary | ICD-10-CM | POA: Diagnosis not present

## 2020-12-15 DIAGNOSIS — E1169 Type 2 diabetes mellitus with other specified complication: Secondary | ICD-10-CM | POA: Diagnosis present

## 2020-12-15 DIAGNOSIS — N1831 Chronic kidney disease, stage 3a: Secondary | ICD-10-CM | POA: Diagnosis not present

## 2020-12-15 DIAGNOSIS — Z803 Family history of malignant neoplasm of breast: Secondary | ICD-10-CM | POA: Diagnosis not present

## 2020-12-15 DIAGNOSIS — Z833 Family history of diabetes mellitus: Secondary | ICD-10-CM

## 2020-12-15 DIAGNOSIS — A419 Sepsis, unspecified organism: Principal | ICD-10-CM | POA: Diagnosis present

## 2020-12-15 DIAGNOSIS — C3490 Malignant neoplasm of unspecified part of unspecified bronchus or lung: Secondary | ICD-10-CM | POA: Diagnosis not present

## 2020-12-15 DIAGNOSIS — J90 Pleural effusion, not elsewhere classified: Secondary | ICD-10-CM | POA: Diagnosis not present

## 2020-12-15 DIAGNOSIS — Z79899 Other long term (current) drug therapy: Secondary | ICD-10-CM | POA: Diagnosis not present

## 2020-12-15 DIAGNOSIS — R0602 Shortness of breath: Secondary | ICD-10-CM | POA: Diagnosis not present

## 2020-12-15 DIAGNOSIS — E039 Hypothyroidism, unspecified: Secondary | ICD-10-CM | POA: Diagnosis not present

## 2020-12-15 DIAGNOSIS — Z9049 Acquired absence of other specified parts of digestive tract: Secondary | ICD-10-CM | POA: Diagnosis not present

## 2020-12-15 DIAGNOSIS — J9601 Acute respiratory failure with hypoxia: Secondary | ICD-10-CM

## 2020-12-15 DIAGNOSIS — I499 Cardiac arrhythmia, unspecified: Secondary | ICD-10-CM | POA: Diagnosis not present

## 2020-12-15 DIAGNOSIS — G4489 Other headache syndrome: Secondary | ICD-10-CM | POA: Diagnosis not present

## 2020-12-15 DIAGNOSIS — R519 Headache, unspecified: Secondary | ICD-10-CM | POA: Diagnosis not present

## 2020-12-15 DIAGNOSIS — R6889 Other general symptoms and signs: Secondary | ICD-10-CM | POA: Diagnosis not present

## 2020-12-15 LAB — CBC WITH DIFFERENTIAL/PLATELET
Abs Immature Granulocytes: 0.04 10*3/uL (ref 0.00–0.07)
Basophils Absolute: 0 10*3/uL (ref 0.0–0.1)
Basophils Relative: 0 %
Eosinophils Absolute: 0 10*3/uL (ref 0.0–0.5)
Eosinophils Relative: 0 %
HCT: 32.3 % — ABNORMAL LOW (ref 36.0–46.0)
Hemoglobin: 9.8 g/dL — ABNORMAL LOW (ref 12.0–15.0)
Immature Granulocytes: 1 %
Lymphocytes Relative: 3 %
Lymphs Abs: 0.2 10*3/uL — ABNORMAL LOW (ref 0.7–4.0)
MCH: 26.4 pg (ref 26.0–34.0)
MCHC: 30.3 g/dL (ref 30.0–36.0)
MCV: 87.1 fL (ref 80.0–100.0)
Monocytes Absolute: 0.4 10*3/uL (ref 0.1–1.0)
Monocytes Relative: 6 %
Neutro Abs: 6.1 10*3/uL (ref 1.7–7.7)
Neutrophils Relative %: 90 %
Platelets: 183 10*3/uL (ref 150–400)
RBC: 3.71 MIL/uL — ABNORMAL LOW (ref 3.87–5.11)
RDW: 16.4 % — ABNORMAL HIGH (ref 11.5–15.5)
WBC: 6.7 10*3/uL (ref 4.0–10.5)
nRBC: 0 % (ref 0.0–0.2)

## 2020-12-15 LAB — COMPREHENSIVE METABOLIC PANEL
ALT: 20 U/L (ref 0–44)
AST: 24 U/L (ref 15–41)
Albumin: 3.5 g/dL (ref 3.5–5.0)
Alkaline Phosphatase: 90 U/L (ref 38–126)
Anion gap: 8 (ref 5–15)
BUN: 18 mg/dL (ref 8–23)
CO2: 23 mmol/L (ref 22–32)
Calcium: 9.1 mg/dL (ref 8.9–10.3)
Chloride: 106 mmol/L (ref 98–111)
Creatinine, Ser: 1.19 mg/dL — ABNORMAL HIGH (ref 0.44–1.00)
GFR, Estimated: 48 mL/min — ABNORMAL LOW (ref >60)
Glucose, Bld: 143 mg/dL — ABNORMAL HIGH (ref 70–99)
Potassium: 4.5 mmol/L (ref 3.5–5.1)
Sodium: 137 mmol/L (ref 135–145)
Total Bilirubin: 0.8 mg/dL (ref 0.3–1.2)
Total Protein: 7.4 g/dL (ref 6.5–8.1)

## 2020-12-15 LAB — TROPONIN I (HIGH SENSITIVITY)
Troponin I (High Sensitivity): 10 ng/L (ref <18)
Troponin I (High Sensitivity): 12 ng/L (ref <18)

## 2020-12-15 LAB — BRAIN NATRIURETIC PEPTIDE: B Natriuretic Peptide: 206 pg/mL — ABNORMAL HIGH (ref 0.0–100.0)

## 2020-12-15 MED ORDER — ACETAMINOPHEN 325 MG PO TABS
650.0000 mg | ORAL_TABLET | Freq: Once | ORAL | Status: AC
  Administered 2020-12-16: 650 mg via ORAL
  Filled 2020-12-15: qty 2

## 2020-12-15 NOTE — ED Provider Notes (Signed)
Emergency Medicine Provider Triage Evaluation Note  Jenna [name] 75 y.o. female was evaluated in triage.  Pt complains of SOB.  EMS picked up patient from home for complaints of shortness of breath began last night.  O2 sats were 95% on room air.  Patient reports he has had a cough.  She states that she does not smoke and no history of asthma or COPD.  She has been COVID-vaccine x3.  No COVID exposure that she knows of.  She states that she feels like her symptoms are getting worse.  Denies any chest pain, fevers, abdominal pain.  Review of Systems  Positive: SOB, cough Negative: CP, Fever  Physical Exam  BP 134/82   Pulse 70   Temp 98.2 F (36.8 C) (Oral)   Resp 18   Ht 5\' 4"  (1.626 m)   Wt 65.8 kg   SpO2 100%   BMI 24.89 kg/m  Gen:   Awake, no distress   HEENT:  Atraumatic  Resp:  Normal effort. Able to speak in full sentences without any difficulty.  Cardiac:  Normal rate  Abd:   Nondistended, nontender MSK:   Moves extremities without difficulty  Neuro:  Speech clear  Medical Decision Making  Medically screening exam initiated at 3:55 AM.  Appropriate orders placed.  Greggory Keen was informed that the remainder of the evaluation will be completed by another provider, this initial triage assessment does not replace that evaluation, and the importance of remaining in the ED until their evaluation is complete.   Clinical Impression  SOB   Portions of this note were generated with Dragon dictation software. Dictation errors may occur despite best attempts at proofreading.     Volanda Napoleon, PA-C 12/15/20 1847    Lorelle Gibbs, DO 12/16/20 0012

## 2020-12-15 NOTE — ED Triage Notes (Signed)
Ems stated, she had so b during the night with a productive cough and has a headache.cbg 161 Left forarm 20g.

## 2020-12-15 NOTE — Telephone Encounter (Signed)
R/s appt per 5/2 sch msg. Pt aware.

## 2020-12-15 NOTE — ED Notes (Signed)
Patient transported to X-ray 

## 2020-12-16 ENCOUNTER — Encounter (HOSPITAL_COMMUNITY): Payer: Self-pay | Admitting: Internal Medicine

## 2020-12-16 ENCOUNTER — Inpatient Hospital Stay (HOSPITAL_COMMUNITY): Payer: Medicare Other

## 2020-12-16 ENCOUNTER — Other Ambulatory Visit: Payer: Self-pay

## 2020-12-16 ENCOUNTER — Emergency Department (HOSPITAL_COMMUNITY): Payer: Medicare Other

## 2020-12-16 DIAGNOSIS — E119 Type 2 diabetes mellitus without complications: Secondary | ICD-10-CM

## 2020-12-16 DIAGNOSIS — K219 Gastro-esophageal reflux disease without esophagitis: Secondary | ICD-10-CM | POA: Diagnosis present

## 2020-12-16 DIAGNOSIS — N1831 Chronic kidney disease, stage 3a: Secondary | ICD-10-CM | POA: Diagnosis present

## 2020-12-16 DIAGNOSIS — E039 Hypothyroidism, unspecified: Secondary | ICD-10-CM | POA: Diagnosis present

## 2020-12-16 DIAGNOSIS — A419 Sepsis, unspecified organism: Secondary | ICD-10-CM | POA: Diagnosis not present

## 2020-12-16 DIAGNOSIS — R918 Other nonspecific abnormal finding of lung field: Secondary | ICD-10-CM | POA: Diagnosis not present

## 2020-12-16 DIAGNOSIS — J90 Pleural effusion, not elsewhere classified: Secondary | ICD-10-CM | POA: Diagnosis not present

## 2020-12-16 DIAGNOSIS — J189 Pneumonia, unspecified organism: Secondary | ICD-10-CM | POA: Diagnosis present

## 2020-12-16 DIAGNOSIS — D509 Iron deficiency anemia, unspecified: Secondary | ICD-10-CM | POA: Diagnosis not present

## 2020-12-16 DIAGNOSIS — Z85118 Personal history of other malignant neoplasm of bronchus and lung: Secondary | ICD-10-CM | POA: Diagnosis not present

## 2020-12-16 DIAGNOSIS — Z86010 Personal history of colonic polyps: Secondary | ICD-10-CM | POA: Diagnosis not present

## 2020-12-16 DIAGNOSIS — R000 Tachycardia, unspecified: Secondary | ICD-10-CM | POA: Diagnosis not present

## 2020-12-16 DIAGNOSIS — Z794 Long term (current) use of insulin: Secondary | ICD-10-CM | POA: Diagnosis not present

## 2020-12-16 DIAGNOSIS — Z9049 Acquired absence of other specified parts of digestive tract: Secondary | ICD-10-CM | POA: Diagnosis not present

## 2020-12-16 DIAGNOSIS — E1122 Type 2 diabetes mellitus with diabetic chronic kidney disease: Secondary | ICD-10-CM | POA: Diagnosis present

## 2020-12-16 DIAGNOSIS — Z888 Allergy status to other drugs, medicaments and biological substances status: Secondary | ICD-10-CM | POA: Diagnosis not present

## 2020-12-16 DIAGNOSIS — Z79899 Other long term (current) drug therapy: Secondary | ICD-10-CM | POA: Diagnosis not present

## 2020-12-16 DIAGNOSIS — R911 Solitary pulmonary nodule: Secondary | ICD-10-CM | POA: Diagnosis not present

## 2020-12-16 DIAGNOSIS — E785 Hyperlipidemia, unspecified: Secondary | ICD-10-CM

## 2020-12-16 DIAGNOSIS — I129 Hypertensive chronic kidney disease with stage 1 through stage 4 chronic kidney disease, or unspecified chronic kidney disease: Secondary | ICD-10-CM | POA: Diagnosis present

## 2020-12-16 DIAGNOSIS — R0602 Shortness of breath: Secondary | ICD-10-CM | POA: Diagnosis not present

## 2020-12-16 DIAGNOSIS — J948 Other specified pleural conditions: Secondary | ICD-10-CM | POA: Diagnosis not present

## 2020-12-16 DIAGNOSIS — I10 Essential (primary) hypertension: Secondary | ICD-10-CM | POA: Diagnosis not present

## 2020-12-16 DIAGNOSIS — Z9841 Cataract extraction status, right eye: Secondary | ICD-10-CM | POA: Diagnosis not present

## 2020-12-16 DIAGNOSIS — Z79890 Hormone replacement therapy (postmenopausal): Secondary | ICD-10-CM | POA: Diagnosis not present

## 2020-12-16 DIAGNOSIS — Z833 Family history of diabetes mellitus: Secondary | ICD-10-CM | POA: Diagnosis not present

## 2020-12-16 DIAGNOSIS — R059 Cough, unspecified: Secondary | ICD-10-CM | POA: Diagnosis present

## 2020-12-16 DIAGNOSIS — Z87891 Personal history of nicotine dependence: Secondary | ICD-10-CM | POA: Diagnosis not present

## 2020-12-16 DIAGNOSIS — J9601 Acute respiratory failure with hypoxia: Secondary | ICD-10-CM | POA: Diagnosis not present

## 2020-12-16 DIAGNOSIS — Z20822 Contact with and (suspected) exposure to covid-19: Secondary | ICD-10-CM | POA: Diagnosis present

## 2020-12-16 DIAGNOSIS — Z800 Family history of malignant neoplasm of digestive organs: Secondary | ICD-10-CM | POA: Diagnosis not present

## 2020-12-16 DIAGNOSIS — Z803 Family history of malignant neoplasm of breast: Secondary | ICD-10-CM | POA: Diagnosis not present

## 2020-12-16 DIAGNOSIS — Z7984 Long term (current) use of oral hypoglycemic drugs: Secondary | ICD-10-CM | POA: Diagnosis not present

## 2020-12-16 HISTORY — PX: IR THORACENTESIS RIGHT ASP PLEURAL SPACE W/IMG GUIDE: IMG5380

## 2020-12-16 LAB — CBG MONITORING, ED
Glucose-Capillary: 161 mg/dL — ABNORMAL HIGH (ref 70–99)
Glucose-Capillary: 254 mg/dL — ABNORMAL HIGH (ref 70–99)

## 2020-12-16 LAB — BODY FLUID CELL COUNT WITH DIFFERENTIAL
Eos, Fluid: 0 %
Lymphs, Fluid: 61 %
Monocyte-Macrophage-Serous Fluid: 35 % — ABNORMAL LOW (ref 50–90)
Neutrophil Count, Fluid: 4 % (ref 0–25)
Total Nucleated Cell Count, Fluid: 680 cu mm (ref 0–1000)

## 2020-12-16 LAB — COMPREHENSIVE METABOLIC PANEL
ALT: 21 U/L (ref 0–44)
AST: 26 U/L (ref 15–41)
Albumin: 3.3 g/dL — ABNORMAL LOW (ref 3.5–5.0)
Alkaline Phosphatase: 80 U/L (ref 38–126)
Anion gap: 9 (ref 5–15)
BUN: 19 mg/dL (ref 8–23)
CO2: 21 mmol/L — ABNORMAL LOW (ref 22–32)
Calcium: 8.6 mg/dL — ABNORMAL LOW (ref 8.9–10.3)
Chloride: 107 mmol/L (ref 98–111)
Creatinine, Ser: 1.17 mg/dL — ABNORMAL HIGH (ref 0.44–1.00)
GFR, Estimated: 49 mL/min — ABNORMAL LOW (ref >60)
Glucose, Bld: 182 mg/dL — ABNORMAL HIGH (ref 70–99)
Potassium: 4 mmol/L (ref 3.5–5.1)
Sodium: 137 mmol/L (ref 135–145)
Total Bilirubin: 0.5 mg/dL (ref 0.3–1.2)
Total Protein: 7 g/dL (ref 6.5–8.1)

## 2020-12-16 LAB — PROTEIN, PLEURAL OR PERITONEAL FLUID: Total protein, fluid: 3.6 g/dL

## 2020-12-16 LAB — LACTATE DEHYDROGENASE, PLEURAL OR PERITONEAL FLUID: LD, Fluid: 103 U/L — ABNORMAL HIGH (ref 3–23)

## 2020-12-16 LAB — HEMOGLOBIN A1C
Hgb A1c MFr Bld: 8.8 % — ABNORMAL HIGH (ref 4.8–5.6)
Mean Plasma Glucose: 205.86 mg/dL

## 2020-12-16 LAB — CBC WITH DIFFERENTIAL/PLATELET
Abs Immature Granulocytes: 0.02 10*3/uL (ref 0.00–0.07)
Basophils Absolute: 0 10*3/uL (ref 0.0–0.1)
Basophils Relative: 0 %
Eosinophils Absolute: 0 10*3/uL (ref 0.0–0.5)
Eosinophils Relative: 0 %
HCT: 33.4 % — ABNORMAL LOW (ref 36.0–46.0)
Hemoglobin: 9.6 g/dL — ABNORMAL LOW (ref 12.0–15.0)
Immature Granulocytes: 1 %
Lymphocytes Relative: 6 %
Lymphs Abs: 0.2 10*3/uL — ABNORMAL LOW (ref 0.7–4.0)
MCH: 26 pg (ref 26.0–34.0)
MCHC: 28.7 g/dL — ABNORMAL LOW (ref 30.0–36.0)
MCV: 90.5 fL (ref 80.0–100.0)
Monocytes Absolute: 0.4 10*3/uL (ref 0.1–1.0)
Monocytes Relative: 11 %
Neutro Abs: 3.2 10*3/uL (ref 1.7–7.7)
Neutrophils Relative %: 82 %
Platelets: 164 10*3/uL (ref 150–400)
RBC: 3.69 MIL/uL — ABNORMAL LOW (ref 3.87–5.11)
RDW: 16.5 % — ABNORMAL HIGH (ref 11.5–15.5)
WBC: 3.9 10*3/uL — ABNORMAL LOW (ref 4.0–10.5)
nRBC: 0 % (ref 0.0–0.2)

## 2020-12-16 LAB — GRAM STAIN

## 2020-12-16 LAB — GLUCOSE, PLEURAL OR PERITONEAL FLUID: Glucose, Fluid: 204 mg/dL

## 2020-12-16 LAB — SARS CORONAVIRUS 2 (TAT 6-24 HRS): SARS Coronavirus 2: NEGATIVE

## 2020-12-16 LAB — PHOSPHORUS: Phosphorus: 3.3 mg/dL (ref 2.5–4.6)

## 2020-12-16 LAB — PROCALCITONIN: Procalcitonin: 0.22 ng/mL

## 2020-12-16 LAB — AMYLASE, PLEURAL OR PERITONEAL FLUID: Amylase, Fluid: 31 U/L

## 2020-12-16 LAB — GLUCOSE, CAPILLARY: Glucose-Capillary: 328 mg/dL — ABNORMAL HIGH (ref 70–99)

## 2020-12-16 LAB — LACTATE DEHYDROGENASE: LDH: 154 U/L (ref 98–192)

## 2020-12-16 LAB — MAGNESIUM: Magnesium: 2 mg/dL (ref 1.7–2.4)

## 2020-12-16 LAB — LACTIC ACID, PLASMA: Lactic Acid, Venous: 1.2 mmol/L (ref 0.5–1.9)

## 2020-12-16 MED ORDER — LABETALOL HCL 100 MG PO TABS
50.0000 mg | ORAL_TABLET | Freq: Two times a day (BID) | ORAL | Status: DC
  Administered 2020-12-16 – 2020-12-17 (×3): 50 mg via ORAL
  Filled 2020-12-16: qty 0.5
  Filled 2020-12-16: qty 1
  Filled 2020-12-16: qty 0.5
  Filled 2020-12-16: qty 1

## 2020-12-16 MED ORDER — IPRATROPIUM-ALBUTEROL 0.5-2.5 (3) MG/3ML IN SOLN: 3.0000 mL | Freq: Four times a day (QID) | RESPIRATORY_TRACT | Status: DC | PRN

## 2020-12-16 MED ORDER — SODIUM CHLORIDE 0.9 % IV SOLN
1.0000 g | Freq: Once | INTRAVENOUS | Status: AC
  Administered 2020-12-16: 1 g via INTRAVENOUS
  Filled 2020-12-16: qty 10

## 2020-12-16 MED ORDER — ACETAMINOPHEN 325 MG PO TABS: 650.0000 mg | ORAL_TABLET | Freq: Four times a day (QID) | ORAL | Status: DC | PRN

## 2020-12-16 MED ORDER — LEVOTHYROXINE SODIUM 75 MCG PO TABS
75.0000 ug | ORAL_TABLET | Freq: Every day | ORAL | Status: DC
  Administered 2020-12-16 – 2020-12-17 (×2): 75 ug via ORAL
  Filled 2020-12-16: qty 3
  Filled 2020-12-16: qty 1

## 2020-12-16 MED ORDER — SODIUM CHLORIDE 0.9 % IV SOLN
500.0000 mg | Freq: Once | INTRAVENOUS | Status: AC
  Administered 2020-12-16: 500 mg via INTRAVENOUS
  Filled 2020-12-16: qty 500

## 2020-12-16 MED ORDER — INSULIN ASPART 100 UNIT/ML IJ SOLN
0.0000 [IU] | Freq: Every day | INTRAMUSCULAR | Status: DC
  Administered 2020-12-16: 4 [IU] via SUBCUTANEOUS

## 2020-12-16 MED ORDER — ATORVASTATIN CALCIUM 40 MG PO TABS
40.0000 mg | ORAL_TABLET | Freq: Every day | ORAL | Status: DC
  Administered 2020-12-16: 40 mg via ORAL
  Filled 2020-12-16: qty 1

## 2020-12-16 MED ORDER — SODIUM CHLORIDE 0.9 % IV BOLUS: 1000.0000 mL | Freq: Once | INTRAVENOUS | Status: DC

## 2020-12-16 MED ORDER — METHYLPREDNISOLONE SODIUM SUCC 125 MG IJ SOLR: 125.0000 mg | Freq: Once | INTRAMUSCULAR | Status: DC

## 2020-12-16 MED ORDER — SODIUM CHLORIDE 0.9 % IV BOLUS
500.0000 mL | Freq: Once | INTRAVENOUS | Status: AC
  Administered 2020-12-16: 500 mL via INTRAVENOUS

## 2020-12-16 MED ORDER — METHYLPREDNISOLONE SODIUM SUCC 125 MG IJ SOLR
125.0000 mg | Freq: Once | INTRAMUSCULAR | Status: AC
  Administered 2020-12-16: 125 mg via INTRAVENOUS
  Filled 2020-12-16: qty 2

## 2020-12-16 MED ORDER — INSULIN ASPART 100 UNIT/ML IJ SOLN
0.0000 [IU] | Freq: Three times a day (TID) | INTRAMUSCULAR | Status: DC
  Administered 2020-12-16: 7 [IU] via SUBCUTANEOUS
  Administered 2020-12-16: 2 [IU] via SUBCUTANEOUS
  Administered 2020-12-17 (×2): 3 [IU] via SUBCUTANEOUS

## 2020-12-16 MED ORDER — IPRATROPIUM-ALBUTEROL 0.5-2.5 (3) MG/3ML IN SOLN
3.0000 mL | Freq: Once | RESPIRATORY_TRACT | Status: AC
  Administered 2020-12-16: 3 mL via RESPIRATORY_TRACT
  Filled 2020-12-16: qty 3

## 2020-12-16 MED ORDER — INSULIN ASPART 100 UNIT/ML IJ SOLN: 0.0000 [IU] | Freq: Three times a day (TID) | INTRAMUSCULAR | Status: DC

## 2020-12-16 MED ORDER — LIDOCAINE HCL (PF) 1 % IJ SOLN
INTRAMUSCULAR | Status: DC | PRN
  Administered 2020-12-16: 10 mL

## 2020-12-16 MED ORDER — ACETAMINOPHEN 650 MG RE SUPP: 650.0000 mg | Freq: Four times a day (QID) | RECTAL | Status: DC | PRN

## 2020-12-16 MED ORDER — LOSARTAN POTASSIUM 50 MG PO TABS
50.0000 mg | ORAL_TABLET | Freq: Every day | ORAL | Status: DC
  Administered 2020-12-16 – 2020-12-17 (×2): 50 mg via ORAL
  Filled 2020-12-16 (×2): qty 1

## 2020-12-16 MED ORDER — IOHEXOL 350 MG/ML SOLN
100.0000 mL | Freq: Once | INTRAVENOUS | Status: AC | PRN
  Administered 2020-12-16: 51 mL via INTRAVENOUS

## 2020-12-16 MED ORDER — SODIUM CHLORIDE 0.9 % IV SOLN
500.0000 mg | INTRAVENOUS | Status: DC
  Administered 2020-12-16: 500 mg via INTRAVENOUS
  Filled 2020-12-16 (×2): qty 500

## 2020-12-16 MED ORDER — LIDOCAINE HCL 1 % IJ SOLN
INTRAMUSCULAR | Status: AC
  Filled 2020-12-16: qty 20

## 2020-12-16 MED ORDER — ALBUTEROL SULFATE (2.5 MG/3ML) 0.083% IN NEBU: 2.5000 mg | INHALATION_SOLUTION | RESPIRATORY_TRACT | Status: DC | PRN

## 2020-12-16 MED ORDER — PANTOPRAZOLE SODIUM 40 MG PO TBEC
40.0000 mg | DELAYED_RELEASE_TABLET | Freq: Every day | ORAL | Status: DC
  Administered 2020-12-16 – 2020-12-17 (×2): 40 mg via ORAL
  Filled 2020-12-16 (×2): qty 1

## 2020-12-16 MED ORDER — SODIUM CHLORIDE 0.9 % IV SOLN
2.0000 g | INTRAVENOUS | Status: DC
  Administered 2020-12-16: 2 g via INTRAVENOUS
  Filled 2020-12-16: qty 2
  Filled 2020-12-16: qty 20

## 2020-12-16 NOTE — ED Notes (Signed)
Per Dr. Wyvonnia Dusky okay to hold meds until phleb collects labs

## 2020-12-16 NOTE — ED Notes (Addendum)
This RN attempted to straight stick pt for blood, unsuccessful. This pt also tried to pull off of pt line with no success. Phleb notified and going to collect blood. MD notified.

## 2020-12-16 NOTE — Progress Notes (Signed)
PROGRESS NOTE  Jenna [name]  DOB: Oct 27, 1945  PCP: Ronnald Nian, DO KPT:[id number]  DOA: 12/15/2020  LOS: 0 days   Chief Complaint  Patient presents with  . Shortness of Breath  . Headache    Brief narrative: Jenna [name] is a 75 y.o. female with PMH significant for DM2, HTN, CKD 3a, chronic anemia, non-small cell lung cancer, right pleural effusion in 2019, hypothyroidism Patient presented to ED on 12/15/2020 with shortness of breath for 3 to 4 days associated with productive cough  In the ED, patient had a fever of 101.9, blood pressure elevated up to 167/57  Maintaining oxygen saturation 94% 200% on room air at rest. Labs unremarkable. Blood cultures were collected, broad-spectrum antibiotic initiated. EKG showed sinus tachycardia with heart rate 100, and no evidence of T wave or ST changes CTA chest, which was compared to CT chest from 12/12/2020, showed no evidence of acute pulmonary embolism, and demonstrated stable moderate to large right sided pleural effusion, without significant change relative to this recent prior CT chest.  Patient was admitted to hospitalist service for further evaluation management.  Subjective: Patient was seen and examined this morning.  Patient was in the ED waiting for bed availability upstairs.  She had audible wheezing, O2 sat 95% on room air Chart reviewed No recurrence of fever, tachycardia improving, blood pressure mostly 140s overnight  Assessment/Plan: Moderate to large right-sided pleural effusion -Presented with shortness of breath -CTA chest finding as above showing moderate to large right-sided pleural effusion in the context of non-small cell lung cancer -Pending ultrasound thoracentesis this morning. -Fluid analysis ordered as well.  History of Non-small cell lung cancer -Follows up with Dr. Julien Nordmann as an outpatient.  She saw her few weeks ago and underwent a CT chest on 4/29 and a repeat CT on this  admission.   -I made Dr. Julien Nordmann aware of patient's admission to the hospital.    Sepsis -Likely pneumonia -Presented with fever, tachycardia, tachypnea. -Unable to rule out underlying consolidation in the setting of large pleural effusion. -Blood cultures sent from ED. -Currently on IV Rocephin and azithromycin. -Hemodynamically stable.  Not requiring IV fluids. Recent Labs  Lab 12/12/20 1213 12/15/20 1845 12/16/20 0211 12/16/20 0747  WBC 4.6 6.7  --  3.9*  LATICACIDVEN  --   --  1.2  --   PROCALCITON  --   --   --  0.22   Audible wheezing -1 dose of Solu-Medrol IV 125 mg.  Continue bronchodilators.  Monitor respiratory status. -Currently not requiring supplemental oxygen.  Type 2 diabetes mellitus -A1c 10.1 on January 2022 -Home meds include Lantus 10 units daily,  Farxiga 10 mg daily, glipizide 10 mg twice daily -Currently on glipizide and sliding scale insulin with Accu-Cheks. Lab Results  Component Value Date   HGBA1C 10.1 (A) 08/28/2020   Recent Labs  Lab 12/16/20 0805  GLUCAP 161*    Essential hypertension -Home meds include Labetalol 50 mg twice daily, losartan 50 mg daily, -Continue the same.  Continue to monitor blood pressure  CKD stage IIIa -Creatinine remains at baseline.  Continue to monitor Recent Labs    02/12/20 0000 02/12/20 0941 06/13/20 0926 08/28/20 1352 12/12/20 1213 12/15/20 1845 12/16/20 0747  BUN 24* 24* 33* 23 23 18 19   CREATININE 1.4* 1.40* 1.46* 1.31* 1.38* 1.19* 1.17*   Chronic iron deficiency anemia -Continue PPI.  Patient seems to be not taking iron supplement at home.  Hemoglobin remained stable. Recent Labs  05/28/20 1141 06/13/20 0926 08/28/20 1352 12/12/20 1213 12/15/20 1845 12/16/20 0747  HGB 11.0* 10.6* 10.8* 10.1* 9.8* 9.6*  MCV 84.8 85.1 81.9 86.2 87.1 90.5  FERRITIN 74.2  --   --   --   --   --   IRON 50  --  60  --   --   --    Hyperlipidemia -Lipitor 40 mg daily  Hypothyroidism -Synthroid 75 mcg  daily    Mobility: With ambulation.  Previously independent Code Status:   Code Status: Full Code  Nutritional status: Body mass index is 30.29 kg/m.     Diet Order            Diet regular Room service appropriate? Yes; Fluid consistency: Thin  Diet effective now                 DVT prophylaxis: SCDs Start: 12/16/20 0211   Antimicrobials:  IV Rocephin, IV azithromycin Fluid: None Consultants: None Family Communication:  None at bedside  Status is: Inpatient  Remains inpatient appropriate because: Needs IV antibiotics  Dispo: The patient is from: Home               Anticipated d/c is to: Home              Patient currently is not medically stable to d/c.   Difficult to place patient No     Infusions:  . [START ON 12/17/2020] azithromycin    . cefTRIAXone (ROCEPHIN)  IV      Scheduled Meds: . atorvastatin  40 mg Oral QHS  . insulin aspart  0-5 Units Subcutaneous QHS  . insulin aspart  0-9 Units Subcutaneous TID WC  . labetalol  50 mg Oral BID  . levothyroxine  75 mcg Oral Q0600  . losartan  50 mg Oral Daily  . methylPREDNISolone (SOLU-MEDROL) injection  125 mg Intravenous Once  . pantoprazole  40 mg Oral Daily    Antimicrobials: Anti-infectives (From admission, onward)   Start     Dose/Rate Route Frequency Ordered Stop   12/17/20 0000  azithromycin (ZITHROMAX) 500 mg in sodium chloride 0.9 % 250 mL IVPB        500 mg 250 mL/hr over 60 Minutes Intravenous Every 24 hours 12/16/20 0213     12/16/20 2200  cefTRIAXone (ROCEPHIN) 2 g in sodium chloride 0.9 % 100 mL IVPB        2 g 200 mL/hr over 30 Minutes Intravenous Every 24 hours 12/16/20 0213     12/16/20 0145  cefTRIAXone (ROCEPHIN) 1 g in sodium chloride 0.9 % 100 mL IVPB        1 g 200 mL/hr over 30 Minutes Intravenous  Once 12/16/20 0143 12/16/20 0349   12/16/20 0145  azithromycin (ZITHROMAX) 500 mg in sodium chloride 0.9 % 250 mL IVPB        500 mg 250 mL/hr over 60 Minutes Intravenous  Once  12/16/20 0143 12/16/20 0503      PRN meds: acetaminophen **OR** acetaminophen, albuterol   Objective: Vitals:   12/16/20 0930 12/16/20 1000  BP: 127/65 (!) 169/56  Pulse: (!) 103 (!) 113  Resp: (!) 26 (!) 34  Temp:    SpO2: 97% 96%   No intake or output data in the 24 hours ending 12/16/20 1105 Filed Weights   12/16/20 0000  Weight: 82.6 kg   Weight change:  Body mass index is 30.29 kg/m.   Physical Exam: General exam: Pleasant, elderly African-American female. Skin: No  rashes, lesions or ulcers. HEENT: Atraumatic, normocephalic, no obvious bleeding Lungs: Audible wheezing.  No crackles CVS: Regular rate and rhythm, no murmur GI/Abd soft, nontender, nondistended, bowel sound present CNS: Alert, awake, oriented x3 Psychiatry: Mood appropriate Extremities: No pedal edema, no calf tenderness  Data Review: I have personally reviewed the laboratory data and studies available.  Recent Labs  Lab 12/12/20 1213 12/15/20 1845 12/16/20 0747  WBC 4.6 6.7 3.9*  NEUTROABS 3.8 6.1 3.2  HGB 10.1* 9.8* 9.6*  HCT 33.6* 32.3* 33.4*  MCV 86.2 87.1 90.5  PLT 206 183 164   Recent Labs  Lab 12/12/20 1213 12/15/20 1845 12/16/20 0747  NA 141 137 137  K 4.0 4.5 4.0  CL 109 106 107  CO2 22 23 21*  GLUCOSE 260* 143* 182*  BUN 23 18 19   CREATININE 1.38* 1.19* 1.17*  CALCIUM 9.1 9.1 8.6*  MG  --   --  2.0  PHOS  --   --  3.3    F/u labs ordered Unresulted Labs (From admission, onward)          Start     Ordered   12/17/20 0500  CBC with Differential/Platelet  Tomorrow morning,   R        12/16/20 1105   12/17/20 4696  Basic metabolic panel  Tomorrow morning,   R        12/16/20 1105   12/16/20 0800  Hemoglobin A1c  Add-on,   AD       Comments: To assess prior glycemic control    12/16/20 0759   12/16/20 0450  Urinalysis, Routine w reflex microscopic  Once,   STAT        12/16/20 0449   12/16/20 0011  Blood culture (routine x 2)  BLOOD CULTURE X 2,   STAT       12/16/20 0010   12/15/20 2356  Resp Panel by RT-PCR (Flu A&B, Covid) Nasopharyngeal Swab  (Tier 2 - Symptomatic/asymptomatic with Precautions )  Once,   STAT       Question Answer Comment  Is this test for diagnosis or screening Screening   Symptomatic for COVID-19 as defined by CDC No   Hospitalized for COVID-19 No   Admitted to ICU for COVID-19 No   Previously tested for COVID-19 Yes   Resident in a congregate (group) care setting No   Employed in healthcare setting No   Pregnant No   Has patient completed COVID vaccination(s) (2 doses of Pfizer/Moderna 1 dose of [employer]) Unknown      12/15/20 2355          Signed, Terrilee Croak, MD [facility] 12/16/2020

## 2020-12-16 NOTE — ED Provider Notes (Signed)
Jenna [name]   CSN: [id number] Arrival date & time: 12/15/20  1838     History Chief Complaint  Patient presents with  . Shortness of Breath  . Headache    Jenna [name] is a 75 y.o. female.  Patient with a history of non-small cell lung cancer in remission, diabetes, hypertension, acid reflux disease here with shortness of breath that onset this morning.  States has been fairly constant.  It is gotten progressively worse throughout the day especially with exertion.  Has a cough that is nonproductive.  No history of COPD or asthma.  She is COVID vaccinated.  She reports she is not currently getting any chemotherapy or radiation.  Denies chest pain.  Denies leg pain or leg swelling.  Found to be febrile on arrival to the ED.  Did not notice that she had a fever at home.  Had a CT scan several days ago that showed:   IMPRESSION: 1. Increase in volume of right pleural effusion, now moderate. In the absence of symptoms of CHF consider diagnostic thoracentesis to exclude malignant effusion. 2. Stable appearance of right midlung perihilar and paramediastinal masslike architectural distortion and fibrosis consistent with changes secondary to external beam radiation. 3. Chronic interstitial reticulation within the left lower lobe is stable and is favored to be in etiology. 4. A few nonspecific nodules are noted within the left lung which appears stable from previous exam. 5. Aortic Atherosclerosis (ICD10-I70.0). Coronary artery Calcifications.  States she normally has sleep propped up but denies any history of CHF or CAD.  States she is currently not curable any radiation or chemotherapy.  The history is provided by the patient and a relative.  Shortness of Breath Associated symptoms: cough, fever, headaches and sore throat   Associated symptoms: no abdominal pain, no chest pain, no rash and no vomiting    Headache Associated symptoms: cough, fatigue, fever and sore throat   Associated symptoms: no abdominal pain, no dizziness, no myalgias, no nausea, no vomiting and no weakness        Past Medical History:  Diagnosis Date  . Blood transfusion without reported diagnosis   . Cataract   . DM (diabetes mellitus) ([place])   . GERD (gastroesophageal reflux disease)   . Hyperlipidemia   . Hypertension   . Iron deficiency anemia   . NSCL ca dx'd 03/2018  . Thyroid disease     Patient Active Problem List   Diagnosis Date Noted  . Hypothyroidism (acquired) 02/14/2020  . Abscess, umbilical 44/96/7591  . Other pancytopenia ([place]) 06/10/2019  . Aortic atherosclerosis ([place]) 06/10/2019  . Hypothyroidism 11/21/2018  . Encounter for antineoplastic immunotherapy 08/03/2018  . Malignant neoplasm of middle lobe of right lung ([place]) 05/04/2018  . Goals of care, counseling/discussion 05/04/2018  . Encounter for antineoplastic chemotherapy 05/04/2018  . HLD (hyperlipidemia) 03/20/2018  . AVM (arteriovenous malformation) 03/15/2012  . GERD 09/26/2009  . CHANGE IN BOWELS 09/26/2009  . Type 2 diabetes mellitus without complication ([place]) 63/84/6659  . OVERWEIGHT 09/25/2009  . Iron deficiency anemia 09/25/2009  . Hypertension 09/25/2009  . CHOLECYSTITIS, HX OF 09/25/2009    Past Surgical History:  Procedure Laterality Date  . CATARACT EXTRACTION Right   . CHOLECYSTECTOMY    . COLONOSCOPY  10/24/2009   normal rectum/1X1cm abnormal lesion in the ascending colon (bx benign). TI normal for 10cm.  Prep difficult/inadequate. f/u TCS 09/2012 recommended  . COLONOSCOPY  10/13/2004   Normal rectum/Diminutive polyps, splenic flexure, cold  biopsied/removed.  Remainder of colonic mucosa appeared normal.  . COLONOSCOPY N/A 12/14/2012   QBH:ALPFXTK polyp-tubular adenoma  . ESOPHAGOGASTRODUODENOSCOPY  10/13/2004    Normal esophagus/ Nodular volcano like lesion in the antrum, either representing a  pancreatic rest  or leiomyoma, biopsied.  Remainder of the gastric mucosa appeared normal, normal D1-D2  . ESOPHAGOGASTRODUODENOSCOPY  10/24/2009   Benign biopsies. normal esophagus/small hiatal hernia/nodular lesion antrum/distal greater curvature. duodenal AVM s/p ablation  . GIVENS CAPSULE STUDY  07/27/2010    multiple arteriovenous malformations which could definitely be the contributor to her drifting hemoglobin and hematocrit  . TUBAL LIGATION    . VIDEO BRONCHOSCOPY WITH ENDOBRONCHIAL ULTRASOUND N/A 04/27/2018   Procedure: VIDEO BRONCHOSCOPY WITH ENDOBRONCHIAL ULTRASOUND;  Surgeon: Melrose Nakayama, MD;  Location: [facility];  Service: Thoracic;  Laterality: N/A;     OB History   No obstetric history on file.     Family History  Problem Relation Age of Onset  . Diabetes Sister   . Colon cancer Maternal Aunt        greater than age 73  . Breast cancer Cousin   . Amblyopia Neg Hx   . Blindness Neg Hx   . Cataracts Neg Hx   . Glaucoma Neg Hx   . Macular degeneration Neg Hx   . Retinal detachment Neg Hx   . Strabismus Neg Hx   . Retinitis pigmentosa Neg Hx   . Rectal cancer Neg Hx   . Stomach cancer Neg Hx   . Colon polyps Neg Hx   . Esophageal cancer Neg Hx     Social History   Tobacco Use  . Smoking status: Former Smoker    Packs/day: 0.50    Years: 55.00    Pack years: 27.50    Types: Cigarettes    Quit date: 03/18/2018    Years since quitting: 2.7  . Smokeless tobacco: Never Used  . Tobacco comment: smoked off and n  Vaping Use  . Vaping Use: Never used  Substance Use Topics  . Alcohol use: No  . Drug use: No    Home Medications Prior to Admission medications   Medication Sig Start Date End Date Taking? Authorizing Provider  atorvastatin (LIPITOR) 40 MG tablet Take 1 tablet (40 mg total) by mouth daily. 06/12/20   Cirigliano, Garvin Fila, DO  blood glucose meter kit and supplies KIT Use up to four times daily as directed 09/28/19   Ronnald Nian, DO  dapagliflozin  propanediol (FARXIGA) 10 MG TABS tablet Take 1 tablet (10 mg total) by mouth daily before breakfast. 03/12/20   Ronnald Nian, DO  Dexlansoprazole 30 MG capsule TAKE 1 CAPSULE BY MOUTH DAILY 10/14/20   Armbruster, Carlota Raspberry, MD  diphenhydrAMINE (BENADRYL) 2 % cream Apply 1 application topically as needed for itching.    [provider]  docusate sodium (COLACE) 100 MG capsule Take 100 mg by mouth daily as needed for mild constipation.    [provider]  Ferrous Sulfate (IRON) 325 (65 Fe) MG TABS TAKE 1 TABLET BY MOUTH TWICE DAILY WITH A MEAL 09/14/19   [name], MD  glipiZIDE (GLUCOTROL) 10 MG tablet Take one tablet by mouth two times daily 03/07/20   Cirigliano, Mary K, DO  insulin glargine (LANTUS SOLOSTAR) 100 UNIT/ML Solostar Pen Inject 10 Units into the skin every morning. And 31G pen needles 1/day 08/28/20   Renato Shin, MD  labetalol (NORMODYNE) 100 MG tablet Take 0.5 tablets (50 mg total) by  mouth 2 (two) times daily. 03/07/20   Cirigliano, Garvin Fila, DO  levothyroxine (SYNTHROID) 75 MCG tablet Take 1 tablet (75 mcg total) by mouth daily. 08/28/20   Renato Shin, MD  losartan (COZAAR) 50 MG tablet Take 1 tablet (50 mg total) by mouth daily. 09/12/20   CiriglianoGarvin Fila, DO  Multiple Vitamin (MULTIVITAMIN WITH MINERALS) TABS tablet Take 1 tablet by mouth at bedtime.     [provider]  Semaglutide (RYBELSUS) 3 MG TABS Take 3 mg by mouth daily. Patient not taking: No sig reported 07/29/20   Renato Shin, MD    Allergies    Paclitaxel, Aspirin, Esomeprazole magnesium, and Ace inhibitors  Review of Systems   Review of Systems  Constitutional: Positive for fatigue and fever. Negative for activity change and appetite change.  HENT: Positive for sore throat.   Eyes: Negative for visual disturbance.  Respiratory: Positive for cough and shortness of breath. Negative for chest tightness.   Cardiovascular: Negative for chest pain.  Gastrointestinal: Negative for  abdominal pain, nausea and vomiting.  Genitourinary: Negative for dysuria and hematuria.  Musculoskeletal: Negative for arthralgias and myalgias.  Skin: Negative for rash.  Neurological: Positive for headaches. Negative for dizziness and weakness.   all other systems are negative except as noted in the HPI and PMH.   Physical Exam Updated Vital Signs BP (!) 149/61 (BP Location: Right Arm)   Pulse (!) 104   Temp 99 F (37.2 C) (Oral)   Resp (!) 24   Ht $R'5\' 5"'KT$  (1.651 m)   Wt 82.6 kg   SpO2 97%   BMI 30.29 kg/m   Physical Exam Vitals and nursing [name] reviewed.  Constitutional:      General: She is not in acute distress.    Appearance: She is well-developed.  HENT:     Head: Normocephalic and atraumatic.     Mouth/Throat:     Pharynx: No oropharyngeal exudate.  Eyes:     Conjunctiva/sclera: Conjunctivae normal.     Pupils: Pupils are equal, round, and reactive to light.  Neck:     Comments: No meningismus. Cardiovascular:     Rate and Rhythm: Normal rate and regular rhythm.     Heart sounds: Normal heart sounds. No murmur heard.   Pulmonary:     Effort: Pulmonary effort is normal. No respiratory distress.     Breath sounds: Rhonchi present.     Comments: Rhonchi on R Chest:     Chest wall: No tenderness.  Abdominal:     Palpations: Abdomen is soft.     Tenderness: There is no abdominal tenderness. There is no guarding or rebound.  Musculoskeletal:        General: No tenderness. Normal range of motion.     Cervical back: Normal range of motion and neck supple.  Skin:    General: Skin is warm.  Neurological:     General: No focal deficit present.     Mental Status: She is alert and oriented to person, place, and time. Mental status is at baseline.     Cranial Nerves: No cranial nerve deficit.     Motor: No abnormal muscle tone.     Coordination: Coordination normal.     Comments:  5/5 strength throughout. CN 2-12 intact.Equal grip strength.   Psychiatric:         Behavior: Behavior normal.     ED Results / Procedures / Treatments   Labs (all labs ordered are listed, but only abnormal results are  displayed) Labs Reviewed  COMPREHENSIVE METABOLIC PANEL - Abnormal; Notable for the following components:      Result Value   Glucose, Bld 143 (*)    Creatinine, Ser 1.19 (*)    GFR, Estimated 48 (*)    All other components within normal limits  CBC WITH DIFFERENTIAL/PLATELET - Abnormal; Notable for the following components:   RBC 3.71 (*)    Hemoglobin 9.8 (*)    HCT 32.3 (*)    RDW 16.4 (*)    Lymphs Abs 0.2 (*)    All other components within normal limits  BRAIN NATRIURETIC PEPTIDE - Abnormal; Notable for the following components:   B Natriuretic Peptide 206.0 (*)    All other components within normal limits  SARS CORONAVIRUS 2 (TAT 6-24 HRS)  RESP PANEL BY RT-PCR (FLU A&B, COVID) ARPGX2  CULTURE, BLOOD (ROUTINE X 2)  CULTURE, BLOOD (ROUTINE X 2)  LACTIC ACID, PLASMA  POC SARS CORONAVIRUS 2 AG -  ED  TROPONIN I (HIGH SENSITIVITY)  TROPONIN I (HIGH SENSITIVITY)    EKG EKG Interpretation  Date/Time:  Tuesday Dec 16 2020 00:11:25 EDT Ventricular Rate:  100 PR Interval:  126 QRS Duration: 93 QT Interval:  355 QTC Calculation: 458 R Axis:   -5 Text Interpretation: Sinus tachycardia Atrial premature complex Anteroseptal infarct, old Rate faster Confirmed by Ezequiel Essex [phone]) on 12/16/2020 12:13:07 AM   Radiology DG Chest 2 View  Result Date: 12/15/2020 CLINICAL DATA:  Short of breath and headache for 1 day, previous tobacco abuse, non-small cell lung cancer EXAM: CHEST - 2 VIEW COMPARISON:  05/08/2018, 12/12/2020 FINDINGS: Frontal and lateral views of the chest demonstrate a stable cardiac silhouette. Continued right perihilar consolidation consistent with prior radiation therapy. Stable volume loss within the right hemithorax. Right pleural effusion is again noted. No pneumothorax. Left chest is clear. IMPRESSION: 1. Stable post  radiation changes right hilar region, unchanged since recent CT. 2. Right pleural effusion. Electronically Signed   By: Randa Ngo M.D.   On: 12/15/2020 19:27   CT Angio Chest PE W and/or Wo Contrast  Result Date: 12/16/2020 CLINICAL DATA:  Productive cough and headache. EXAM: CT ANGIOGRAPHY CHEST WITH CONTRAST TECHNIQUE: Multidetector CT imaging of the chest was performed using the standard protocol during bolus administration of intravenous contrast. Multiplanar CT image reconstructions and MIPs were obtained to evaluate the vascular anatomy. CONTRAST:  33mL OMNIPAQUE IOHEXOL 350 MG/ML SOLN COMPARISON:  December 12, 2020 FINDINGS: Cardiovascular: Moderate severity calcification of the aortic arch is noted, without evidence of aneurysmal dilatation or dissection. Satisfactory opacification of the pulmonary arteries to the segmental level. No evidence of pulmonary embolism. Normal heart size with moderate severity coronary artery calcification. No pericardial effusion. Mediastinum/Nodes: No enlarged mediastinal, hilar, or axillary lymph nodes. The right paratracheal lymph node seen on the prior study is poorly visualized on the current exam. A stable 4 mm precarinal lymph node is noted (axial CT image 46, CT series number 5). Thyroid gland, trachea, and esophagus demonstrate no significant findings. Lungs/Pleura: A large area of right perihilar and right paramediastinal mass-like architectural distortion is seen. This is unchanged in appearance when compared to the prior study. A stable 5 mm noncalcified subpleural lung nodule is seen within the posterior aspect of the left lower lobe (axial CT image 102, CT series 11). A stable 4 mm linear nodule is seen within the left upper lobe (axial CT image 54, CT series 11). A stable moderate to large right-sided pleural effusion is seen. No  pneumothorax is identified. Upper Abdomen: No acute abnormality. Musculoskeletal: A chronic appearing deformity is again seen  involving the body of the sternum. No acute or significant osseous findings. Review of the MIP images confirms the above findings. IMPRESSION: 1. No evidence of pulmonary embolism. 2. Stable moderate to large right-sided pleural effusion. A malignant effusion cannot be excluded. Correlation with diagnostic thoracentesis should be considered. 3. Stable area of mass-like architectural distortion within the perihilar and paramediastinal regions on the right, suggestive of sequelae related tube external beam radiation. 4. Stable subcentimeter left upper lobe and left lower lobe noncalcified lung nodules. Electronically Signed   By: Virgina Norfolk M.D.   On: 12/16/2020 01:38    Procedures .Critical Care Performed by: Ezequiel Essex, MD Authorized by: Ezequiel Essex, MD   Critical care provider statement:    Critical care time (minutes):  45   Critical care was necessary to treat or prevent imminent or life-threatening deterioration of the following conditions:  Sepsis and respiratory failure   Critical care was time spent personally by me on the following activities:  Discussions with consultants, evaluation of patient's response to treatment, examination of patient, ordering and performing treatments and interventions, ordering and review of laboratory studies, ordering and review of radiographic studies, pulse oximetry, re-evaluation of patient's condition, obtaining history from patient or surrogate and review of old charts     Medications Ordered in ED Medications  acetaminophen (TYLENOL) tablet 650 mg (has no administration in time range)  ipratropium-albuterol (DUONEB) 0.5-2.5 (3) MG/3ML nebulizer solution 3 mL (has no administration in time range)    ED Course  I have reviewed the triage vital signs and the nursing notes.  Pertinent labs & imaging results that were available during my care of the patient were reviewed by me and considered in my medical decision making (see chart for  details).    MDM Rules/Calculators/A&P                          SOB with for the past 1 day. Febrile on arrival. No hypoxia.   Rhonchi on the right with chest x-ray showing right-sided pleural effusion.  She did have a CT scan on April 29 that showed pleural effusion as well  Patient tachycardic and febrile.  She started on IV fluids, broad-spectrum antibiotics after cultures and lactate are obtained. Concern for possible sepsis from pneumonia.  She is started on broad-spectrum antibiotics as well as IV fluids after cultures were obtained.  Lactate is pending.  CT is negative for pulmonary embolism but does show a large right-sided effusion with concern for possible malignant effusion.  With attempted ambulation, patient becomes dyspneic with O2 saturation in the low 90s.  She is tachycardic to the 1 teens and 120s. She started on IV fluids and IV antibiotics after cultures were obtained.  Will benefit from admission for thoracentesis in the morning.  Continue antibiotics for possible component of pneumonia as well.  Admission discussed with Dr. Velia Meyer.   Jenna [name] was evaluated in Emergency Department on 12/16/2020 for the symptoms described in the history of present illness. She was evaluated in the context of the global COVID-19 pandemic, which necessitated consideration that the patient might be at risk for infection with the SARS-CoV-2 virus that causes COVID-19. Institutional protocols and algorithms that pertain to the evaluation of patients at risk for COVID-19 are in a state of rapid change based on information released by regulatory bodies including the CDC  and federal and state organizations. These policies and algorithms were followed during the patient's care in the ED.   Final Clinical Impression(s) / ED Diagnoses Final diagnoses:  Pleural effusion    Rx / DC Orders ED Discharge Orders    None       Avishai Reihl, Annie Main, MD 12/16/20 [phone]

## 2020-12-16 NOTE — Procedures (Signed)
PROCEDURE SUMMARY:  Successful image-guided right thoracentesis. Yielded 400 milliliters of clear gold fluid. Patient tolerated procedure well. No immediate complications. EBL <1 mL.  Specimen was sent for labs. CXR ordered.  Please see imaging section of Epic for full dictation.   Earley Abide PA-C 12/16/2020 12:36 PM

## 2020-12-16 NOTE — ED Notes (Signed)
Pt taken to CT.

## 2020-12-16 NOTE — ED Notes (Signed)
Ambulated pt on pulse ox. SPO2 @ 94%. Pt having labored respirations but denies feeling SOB, dizziness.

## 2020-12-16 NOTE — Progress Notes (Signed)
RT to give nebulizer. Patient OTF

## 2020-12-16 NOTE — Plan of Care (Signed)

## 2020-12-16 NOTE — Progress Notes (Signed)
Brief note regarding plan, with full H&P to follow:  75 year old female with history of non-small cell lung cancer who is being admitted with moderate to large right pleural effusion and potential community-acquired pneumonia after presenting to [facility] emergency department complaining of shortness of breath and new onset nonproductive cough.  We will pursue diagnostic and therapeutic thoracentesis via interventional radiology, with orders for this purpose to be placed shortly.  Also started on azithromycin and Rocephin as coverage for potential concomitant intimately associated community-acquired pneumonia.  Of note, the patient had a right-sided pleural effusion that was drained back in 2019, representing the last time she underwent thoracentesis.    Babs Bertin, DO Hospitalist

## 2020-12-16 NOTE — ED Notes (Addendum)
Pt daughter Baker Janus would like a call when pt gets a room assignment. If she does not answer, we can leave a voicemail with room assignment. We can also call the mobile number in chart which is her daughter.

## 2020-12-16 NOTE — ED Notes (Signed)
Pt transported to [facility]

## 2020-12-16 NOTE — H&P (Signed)
History and Physical    PLEASE NOTE THAT DRAGON DICTATION SOFTWARE WAS USED IN THE CONSTRUCTION OF THIS NOTE.   Jenna [name] ZOX:[id number] DOB: 04/27/1946 DOA: 12/15/2020  PCP: Ronnald Nian, DO Patient coming from: home   I have personally briefly reviewed patient's old medical records in [place]  Chief Complaint: Shortness of breath  HPI: Jenna [name] is a 75 y.o. female with medical history significant for non-small cell lung cancer, right pleural effusion in 2019, hypothyroidism, chronic iron deficiency anemia with baseline hemoglobin 10-11, hypertension, type 2 diabetes mellitus, stage IIIa chronic kidney disease with baseline creatinine 1.2-1.5, who is admitted to [facility] on 12/15/2020 with right pleural effusion after presenting from home to [facility] ED complaining of shortness of breath.   The patient reports 3 to 4 days of progressive shortness of breath associated with new onset productive cough.  She denies any associated orthopnea, PND, or new onset peripheral edema.  She also denies any associated chest pain, palpitations, diaphoresis, nausea, vomiting, dizziness, PCP, or syncope.  Denies subjective fever, chills, rigors, or generalized myalgias.  No recent rhinitis, rhinorrhea, sore throat, wheezing, abdominal pain, diarrhea, or rash.  No recent traveling or known COVID-19 exposures.  No recent trauma or travel.  Denies any new onset calf tenderness or lower extremity erythema.  No personal or family history of known DVT/PE.  Not associate with any hemoptysis.  She denies any recent dysuria, gross hematuria, or change in urinary urgency/frequency.  My discussions with the patient, she confirms that she had a right-sided pleural effusion in 2019, for which she underwent thoracentesis.  She does not recall if this was both a diagnostic and therapeutic thoracentesis, or just at the therapeutic thoracentesis.  She notes no need for interval  thoracentesis following this 2019 procedure.  She conveys that her presenting constellation of symptoms are qualitatively similar to that which she was experiencing at the time leading up to her 2019 thoracentesis.      ED Course:  Vital signs in the ED were notable for the following:  -Temperature max one 1.9, heart rate 92-1 25, blood pressure 136/52 153/55; respiratory rate 17-27; oxygen saturation 94 to 100% on room air.  Labs were notable for the following: CMP was notable for the following: Sodium 137, bicarbonate 23, creatinine 1.19 relative to most recent prior value of 1.38 on 12/12/2020, glucose 143.  CBC notable for white blood cell count of 6700, hemoglobin 9.8 relative to most recent prior 10.1 on 12/12/2020, platelet count 183.  Lactic acid 1.2.  BNP 206.  High-sensitivity troponin I x2 found to be 10 followed by 12.  Nasopharyngeal COVID-19 PCR/influenza PCR were performed in the ED today, with results found to be negative.  Blood cultures x2 were collected prior to initiation of antibiotics.  EKG showed sinus tachycardia with heart rate 100, and no evidence of T wave or ST changes, including no evidence of ST elevation.  CTA chest, which was compared to CT chest from 12/12/2020, showed no evidence of acute pulmonary embolism, and demonstrated stable moderate to large right sided pleural effusion, without significant change relative to this recent prior CT chest.  Otherwise, today CTA chest showed no evidence of acute cardiopulmonary process.   While in the ED, the following were administered: Acetaminophen 650 mg p.o. x1, azithromycin, Rocephin, and a 500 cc normal saline bolus.  Socially, the patient was admitted to the med telemetry for further evaluation and management of presenting right-sided pleural effusion, including  for diagnostic and therapeutic thoracentesis.      Review of Systems: As per HPI otherwise 10 point review of systems negative.   Past Medical History:  Diagnosis  Date  . Blood transfusion without reported diagnosis   . Cataract   . DM (diabetes mellitus) ([place])   . GERD (gastroesophageal reflux disease)   . Hyperlipidemia   . Hypertension   . Iron deficiency anemia   . NSCL ca dx'd 03/2018  . Thyroid disease     Past Surgical History:  Procedure Laterality Date  . CATARACT EXTRACTION Right   . CHOLECYSTECTOMY    . COLONOSCOPY  10/24/2009   normal rectum/1X1cm abnormal lesion in the ascending colon (bx benign). TI normal for 10cm.  Prep difficult/inadequate. f/u TCS 09/2012 recommended  . COLONOSCOPY  10/13/2004   Normal rectum/Diminutive polyps, splenic flexure, cold biopsied/removed.  Remainder of colonic mucosa appeared normal.  . COLONOSCOPY N/A 12/14/2012   JOA:CZYSAYT polyp-tubular adenoma  . ESOPHAGOGASTRODUODENOSCOPY  10/13/2004    Normal esophagus/ Nodular volcano like lesion in the antrum, either representing a  pancreatic rest or leiomyoma, biopsied.  Remainder of the gastric mucosa appeared normal, normal D1-D2  . ESOPHAGOGASTRODUODENOSCOPY  10/24/2009   Benign biopsies. normal esophagus/small hiatal hernia/nodular lesion antrum/distal greater curvature. duodenal AVM s/p ablation  . GIVENS CAPSULE STUDY  07/27/2010    multiple arteriovenous malformations which could definitely be the contributor to her drifting hemoglobin and hematocrit  . TUBAL LIGATION    . VIDEO BRONCHOSCOPY WITH ENDOBRONCHIAL ULTRASOUND N/A 04/27/2018   Procedure: VIDEO BRONCHOSCOPY WITH ENDOBRONCHIAL ULTRASOUND;  Surgeon: Melrose Nakayama, MD;  Location: [facility];  Service: Thoracic;  Laterality: N/A;    Social History:  reports that she quit smoking about 2 years ago. Her smoking use included cigarettes. She has a 27.50 pack-year smoking history. She has never used smokeless tobacco. She reports that she does not drink alcohol and does not use drugs.   Allergies  Allergen Reactions  . Paclitaxel Other (See Comments)    Unresponsiveness shortly after Taxol  inf started 06/12/18.  . Aspirin Other (See Comments)    Stomach bleeding   . Esomeprazole Magnesium     UNSPECIFIED REACTION   . Ace Inhibitors Other (See Comments)    Dizziness, drunk like    Family History  Problem Relation Age of Onset  . Diabetes Sister   . Colon cancer Maternal Aunt        greater than age 6  . Breast cancer Cousin   . Amblyopia Neg Hx   . Blindness Neg Hx   . Cataracts Neg Hx   . Glaucoma Neg Hx   . Macular degeneration Neg Hx   . Retinal detachment Neg Hx   . Strabismus Neg Hx   . Retinitis pigmentosa Neg Hx   . Rectal cancer Neg Hx   . Stomach cancer Neg Hx   . Colon polyps Neg Hx   . Esophageal cancer Neg Hx     Family history reviewed and not pertinent    Prior to Admission medications   Medication Sig Start Date End Date Taking? Authorizing Provider  atorvastatin (LIPITOR) 40 MG tablet Take 1 tablet (40 mg total) by mouth daily. 06/12/20   Cirigliano, Garvin Fila, DO  blood glucose meter kit and supplies KIT Use up to four times daily as directed 09/28/19   Ronnald Nian, DO  dapagliflozin propanediol (FARXIGA) 10 MG TABS tablet Take 1 tablet (10 mg total) by mouth daily before breakfast. 03/12/20  Cirigliano, Mary K, DO  Dexlansoprazole 30 MG capsule TAKE 1 CAPSULE BY MOUTH DAILY Patient taking differently: Take 30 mg by mouth daily. 10/14/20   Armbruster, Carlota Raspberry, MD  diphenhydrAMINE (BENADRYL) 2 % cream Apply 1 application topically as needed for itching.    [provider]  docusate sodium (COLACE) 100 MG capsule Take 100 mg by mouth daily as needed for mild constipation.    [provider]  Ferrous Sulfate (IRON) 325 (65 Fe) MG TABS TAKE 1 TABLET BY MOUTH TWICE DAILY WITH A MEAL Patient taking differently: Take 325 mg by mouth 2 (two) times daily with a meal. 09/14/19   [name], MD  glipiZIDE (GLUCOTROL) 10 MG tablet Take one tablet by mouth two times daily Patient taking differently: Take 10 mg by mouth 2 (two) times  daily before a meal. 03/07/20   Cirigliano, Mary K, DO  insulin glargine (LANTUS SOLOSTAR) 100 UNIT/ML Solostar Pen Inject 10 Units into the skin every morning. And 31G pen needles 1/day 08/28/20   Renato Shin, MD  labetalol (NORMODYNE) 100 MG tablet Take 0.5 tablets (50 mg total) by mouth 2 (two) times daily. 03/07/20   Cirigliano, Garvin Fila, DO  levothyroxine (SYNTHROID) 75 MCG tablet Take 1 tablet (75 mcg total) by mouth daily. 08/28/20   Renato Shin, MD  losartan (COZAAR) 50 MG tablet Take 1 tablet (50 mg total) by mouth daily. 09/12/20   CiriglianoGarvin Fila, DO  Multiple Vitamin (MULTIVITAMIN WITH MINERALS) TABS tablet Take 1 tablet by mouth at bedtime.     [provider]  Semaglutide (RYBELSUS) 3 MG TABS Take 3 mg by mouth daily. Patient not taking: No sig reported 07/29/20   Renato Shin, MD     Objective    Physical Exam: Vitals:   12/16/20 0123 12/16/20 0124 12/16/20 0130 12/16/20 0200  BP:   (!) 152/50 (!) 146/47  Pulse:  (!) 124 (!) 127 (!) 129  Resp:  (!) 25 (!) 29 (!) 27  Temp:      TempSrc:      SpO2: 98% 100% 98% 95%  Weight:      Height:        General: appears to be stated age; alert, oriented;  Skin: warm, dry, no rash Head:  AT/[place] Mouth:  Oral mucosa membranes appear moist, normal dentition Neck: supple; trachea midline Heart: Mildly tachycardic, but regular; did not appreciate any M/R/G Lungs: Diminished right basilar breath sounds, but otherwise CTAB, did not appreciate any wheezes, rales, or rhonchi Abdomen: + BS; soft, ND, NT Vascular: 2+ pedal pulses b/l; 2+ radial pulses b/l Extremities: no peripheral edema, no muscle wasting Neuro: strength and sensation intact in upper and lower extremities b/l   Labs on Admission: I have personally reviewed following labs and imaging studies  CBC: Recent Labs  Lab 12/12/20 1213 12/15/20 1845  WBC 4.6 6.7  NEUTROABS 3.8 6.1  HGB 10.1* 9.8*  HCT 33.6* 32.3*  MCV 86.2 87.1  PLT 206 962   Basic  Metabolic Panel: Recent Labs  Lab 12/12/20 1213 12/15/20 1845  NA 141 137  K 4.0 4.5  CL 109 106  CO2 22 23  GLUCOSE 260* 143*  BUN 23 18  CREATININE 1.38* 1.19*  CALCIUM 9.1 9.1   GFR: Estimated Creatinine Clearance: 44 mL/min (A) (by C-G formula based on SCr of 1.19 mg/dL (H)). Liver Function Tests: Recent Labs  Lab 12/12/20 1213 12/15/20 1845  AST 19 24  ALT 21 20  ALKPHOS 115  90  BILITOT 0.3 0.8  PROT 7.9 7.4  ALBUMIN 3.7 3.5   No results for input(s): LIPASE, AMYLASE in the last 168 hours. No results for input(s): AMMONIA in the last 168 hours. Coagulation Profile: No results for input(s): INR, PROTIME in the last 168 hours. Cardiac Enzymes: No results for input(s): CKTOTAL, CKMB, CKMBINDEX, TROPONINI in the last 168 hours. BNP (last 3 results) No results for input(s): PROBNP in the last 8760 hours. HbA1C: No results for input(s): HGBA1C in the last 72 hours. CBG: No results for input(s): GLUCAP in the last 168 hours. Lipid Profile: No results for input(s): CHOL, HDL, LDLCALC, TRIG, CHOLHDL, LDLDIRECT in the last 72 hours. Thyroid Function Tests: No results for input(s): TSH, T4TOTAL, FREET4, T3FREE, THYROIDAB in the last 72 hours. Anemia Panel: No results for input(s): VITAMINB12, FOLATE, FERRITIN, TIBC, IRON, RETICCTPCT in the last 72 hours. Urine analysis:    Component Value Date/Time   COLORURINE YELLOW 07/17/2010 1650   APPEARANCEUR CLEAR 07/17/2010 1650   LABSPEC >1.030 (H) 07/17/2010 1650   PHURINE 5.5 07/17/2010 1650   GLUCOSEU NEGATIVE 07/17/2010 1650   HGBUR NEGATIVE 07/17/2010 1650   BILIRUBINUR NEGATIVE 07/17/2010 1650   KETONESUR NEGATIVE 07/17/2010 1650   PROTEINUR NEGATIVE 07/17/2010 1650   UROBILINOGEN 0.2 07/17/2010 1650   NITRITE NEGATIVE 07/17/2010 1650   LEUKOCYTESUR  07/17/2010 1650    NEGATIVE MICROSCOPIC NOT DONE ON URINES WITH NEGATIVE PROTEIN, BLOOD, LEUKOCYTES, NITRITE, OR GLUCOSE <1000 mg/dL.    Radiological Exams on  Admission: DG Chest 2 View  Result Date: 12/15/2020 CLINICAL DATA:  Short of breath and headache for 1 day, previous tobacco abuse, non-small cell lung cancer EXAM: CHEST - 2 VIEW COMPARISON:  05/08/2018, 12/12/2020 FINDINGS: Frontal and lateral views of the chest demonstrate a stable cardiac silhouette. Continued right perihilar consolidation consistent with prior radiation therapy. Stable volume loss within the right hemithorax. Right pleural effusion is again noted. No pneumothorax. Left chest is clear. IMPRESSION: 1. Stable post radiation changes right hilar region, unchanged since recent CT. 2. Right pleural effusion. Electronically Signed   By: Randa Ngo M.D.   On: 12/15/2020 19:27   CT Angio Chest PE W and/or Wo Contrast  Result Date: 12/16/2020 CLINICAL DATA:  Productive cough and headache. EXAM: CT ANGIOGRAPHY CHEST WITH CONTRAST TECHNIQUE: Multidetector CT imaging of the chest was performed using the standard protocol during bolus administration of intravenous contrast. Multiplanar CT image reconstructions and MIPs were obtained to evaluate the vascular anatomy. CONTRAST:  49mL OMNIPAQUE IOHEXOL 350 MG/ML SOLN COMPARISON:  December 12, 2020 FINDINGS: Cardiovascular: Moderate severity calcification of the aortic arch is noted, without evidence of aneurysmal dilatation or dissection. Satisfactory opacification of the pulmonary arteries to the segmental level. No evidence of pulmonary embolism. Normal heart size with moderate severity coronary artery calcification. No pericardial effusion. Mediastinum/Nodes: No enlarged mediastinal, hilar, or axillary lymph nodes. The right paratracheal lymph node seen on the prior study is poorly visualized on the current exam. A stable 4 mm precarinal lymph node is noted (axial CT image 46, CT series number 5). Thyroid gland, trachea, and esophagus demonstrate no significant findings. Lungs/Pleura: A large area of right perihilar and right paramediastinal mass-like  architectural distortion is seen. This is unchanged in appearance when compared to the prior study. A stable 5 mm noncalcified subpleural lung nodule is seen within the posterior aspect of the left lower lobe (axial CT image 102, CT series 11). A stable 4 mm linear nodule is seen within the left upper  lobe (axial CT image 54, CT series 11). A stable moderate to large right-sided pleural effusion is seen. No pneumothorax is identified. Upper Abdomen: No acute abnormality. Musculoskeletal: A chronic appearing deformity is again seen involving the body of the sternum. No acute or significant osseous findings. Review of the MIP images confirms the above findings. IMPRESSION: 1. No evidence of pulmonary embolism. 2. Stable moderate to large right-sided pleural effusion. A malignant effusion cannot be excluded. Correlation with diagnostic thoracentesis should be considered. 3. Stable area of mass-like architectural distortion within the perihilar and paramediastinal regions on the right, suggestive of sequelae related tube external beam radiation. 4. Stable subcentimeter left upper lobe and left lower lobe noncalcified lung nodules. Electronically Signed   By: Virgina Norfolk M.D.   On: 12/16/2020 01:38     EKG: Independently reviewed, with result as described above.    Assessment/Plan   Jenna [name] is a 75 y.o. female with medical history significant for non-small cell lung cancer, right pleural effusion in 2019, hypothyroidism, chronic iron deficiency anemia with baseline hemoglobin 10-11, hypertension, type 2 diabetes mellitus, stage IIIa chronic kidney disease with baseline creatinine 1.2-1.5, who is admitted to [facility] on 12/15/2020 with right pleural effusion after presenting from home to [facility] ED complaining of shortness of breath.    Principal Problem:   Pleural effusion on right Active Problems:   Type 2 diabetes mellitus without complication (HCC)   Iron deficiency anemia    Hypertension   HLD (hyperlipidemia)   Sepsis ([place])   CAP (community acquired pneumonia)   SOB (shortness of breath)     #) Moderate to large right-sided pleural effusion; CTA chest shows evidence of a moderate to large right-sided pleural effusion, which appears to be stable in appearance relative to CTA chest performed a few days prior on 12/12/2020.  This is in the context of a history of non-small cell lung cancer as well as a prior right-sided pleural effusion in 2019 status postthoracentesis at that time, but without recurrent need for subsequent thoracentesis.  Given this history, differential includes malignant pleural effusion.  Given progressive shortness of breath over the last several days in the setting of this moderate to large right-sided pleural effusion, the patient would appear to benefit from a diagnostic/therapeutic thoracentesis.  Of note, the patient is on no blood thinners at home, including no aspirin.   Plan: I have placed an order for US Thoracentesis Asp Pleural Space w/Img Guide with IR to occur in the morning for therapeutic and diagnostic purposes, and have included with this order associated diagnostic labs relating to the pleural fluid, which include pleural culture and gram stain, cell count with differential, cytology, LDH, amylase, glucose, protein, pH.  I have also ordered serum LDH to be checked in the morning for evaluation via lights criteria.  CMP in the morning, including for evaluation of serum total protein.  Monitor continuous pulse oximetry on telemetry.       #) Sepsis due to [employer] pneumonia: Diagnosis on the basis of 3 to 4 days of progressive shortness of breath associated with new onset nonproductive cough, with imaging unable to rule out the possibility of right-sided pneumonia in the setting of moderate to large pleural effusion, which potentially could represent a apparent pneumonic process.  SIRS criteria met via presenting objective fever as  well as presenting tachycardia and tachypnea.  Patient sepsis does not meet criteria to considered severe nature in the absence of any associated evidence of endorgan damage,  including presenting lactic acid level, which was found to be nonelevated at 1.2.  No evidence of associated hypotension.  Blood cultures x2 were collected in the emergency department today prior to initiation of coverage for community-acquired pneumonia in the form of azithromycin and Rocephin.  No additional source of underlying infection identified at this time, including nasopharyngeal COVID-19/influenza PCR, which were performed in the ED today, and found to be negative.  Plan: Monitor for results of blood cultures x2.  Continue azithromycin and Rocephin.  Repeat CBC with differential in the morning.  Add on serum procalcitonin level monitor continuous pulse oximetry.  As needed acetaminophen for fever.  Check urinalysis.       #) Hypothyroidism: On Synthroid as an outpatient.  Plan: Continue home Synthroid.      #) Chronic iron deficiency anemia: Associated with baseline hemoglobin of 10-11, with presenting CBC reflecting hemoglobin within this range.  No evidence of active bleed.  Plan: Repeat CBC in the morning.       #) Essential hypertension: On labetalol and losartan as an outpatient.  Systolic blood pressures have been in the range of the 130s to 150s in the ED, without any associated evidence of hypotension.  Plan: In the setting of presenting sepsis, will be conservative with resumption of home antihypertensive regimen.  Specifically, will resume labetalol at this time, while holding losartan and closely monitoring sitting blood pressure via routine vital signs.      #) Hyperlipidemia: On atorvastatin as an outpatient.  Plan: Continue home statin.      #) Type 2 diabetes mellitus: On Lantus 10 units subcu daily in the absence of any associated sign scale insulin.  She is also on glipizide  and dapagliflozin.  Presenting blood sugar noted to be 143.  Plan: Hold home basal insulin during this hospitalization.  Hold home oral hypoglycemic agents.  Accu-Cheks before every meal and at bedtime with low-dose sliding scale insulin.      #) Stage III chronic kidney disease: Associated baseline creatinine of 1.2-1.5, with presenting serum creatinine noted to be within this range.  Plan: Monitor strict I's and O's and daily weights.  Tempt avoid nephrotoxic agents.  Repeat BMP in the morning.    DVT prophylaxis: scd's  Code Status: Full code Family Communication: none Disposition Plan: Per Rounding Team Consults called: none  Admission status: Inpatient; med telemetry    Of note, this patient was added by me to the following Admit List/Treatment Team:  mcadmits.     PLEASE NOTE THAT DRAGON DICTATION SOFTWARE WAS USED IN THE CONSTRUCTION OF THIS NOTE.   Rhetta Mura DO [facility] Pager [phone] From [place]   12/16/2020, 2:13 AM

## 2020-12-16 NOTE — ED Notes (Signed)
This RN attempted to collect morning labs on pt. Unsuccessful. Notified oncoming Rn and phleb will get

## 2020-12-17 DIAGNOSIS — J90 Pleural effusion, not elsewhere classified: Secondary | ICD-10-CM

## 2020-12-17 LAB — CBC WITH DIFFERENTIAL/PLATELET
Abs Immature Granulocytes: 0 10*3/uL (ref 0.00–0.07)
Basophils Absolute: 0 10*3/uL (ref 0.0–0.1)
Basophils Relative: 0 %
Eosinophils Absolute: 0 10*3/uL (ref 0.0–0.5)
Eosinophils Relative: 0 %
HCT: 32.3 % — ABNORMAL LOW (ref 36.0–46.0)
Hemoglobin: 9.8 g/dL — ABNORMAL LOW (ref 12.0–15.0)
Immature Granulocytes: 0 %
Lymphocytes Relative: 6 %
Lymphs Abs: 0.2 10*3/uL — ABNORMAL LOW (ref 0.7–4.0)
MCH: 26 pg (ref 26.0–34.0)
MCHC: 30.3 g/dL (ref 30.0–36.0)
MCV: 85.7 fL (ref 80.0–100.0)
Monocytes Absolute: 0.4 10*3/uL (ref 0.1–1.0)
Monocytes Relative: 10 %
Neutro Abs: 3.2 10*3/uL (ref 1.7–7.7)
Neutrophils Relative %: 84 %
Platelets: 186 10*3/uL (ref 150–400)
RBC: 3.77 MIL/uL — ABNORMAL LOW (ref 3.87–5.11)
RDW: 16.2 % — ABNORMAL HIGH (ref 11.5–15.5)
WBC: 3.8 10*3/uL — ABNORMAL LOW (ref 4.0–10.5)
nRBC: 0 % (ref 0.0–0.2)

## 2020-12-17 LAB — GLUCOSE, CAPILLARY
Glucose-Capillary: 322 mg/dL — ABNORMAL HIGH (ref 70–99)
Glucose-Capillary: 230 mg/dL — ABNORMAL HIGH (ref 70–99)
Glucose-Capillary: 247 mg/dL — ABNORMAL HIGH (ref 70–99)

## 2020-12-17 LAB — BASIC METABOLIC PANEL
Anion gap: 9 (ref 5–15)
BUN: 31 mg/dL — ABNORMAL HIGH (ref 8–23)
CO2: 20 mmol/L — ABNORMAL LOW (ref 22–32)
Calcium: 8.8 mg/dL — ABNORMAL LOW (ref 8.9–10.3)
Chloride: 105 mmol/L (ref 98–111)
Creatinine, Ser: 1.34 mg/dL — ABNORMAL HIGH (ref 0.44–1.00)
GFR, Estimated: 42 mL/min — ABNORMAL LOW (ref >60)
Glucose, Bld: 260 mg/dL — ABNORMAL HIGH (ref 70–99)
Potassium: 4 mmol/L (ref 3.5–5.1)
Sodium: 134 mmol/L — ABNORMAL LOW (ref 135–145)

## 2020-12-17 LAB — CYTOLOGY - NON PAP

## 2020-12-17 LAB — TSH: TSH: 0.472 u[IU]/mL (ref 0.350–4.500)

## 2020-12-17 LAB — PH, BODY FLUID: pH, Body Fluid: 7.7

## 2020-12-17 MED ORDER — DAPAGLIFLOZIN PROPANEDIOL 10 MG PO TABS: 10.0000 mg | ORAL_TABLET | Freq: Every day | ORAL | Status: DC

## 2020-12-17 MED ORDER — AMOXICILLIN-POT CLAVULANATE 875-125 MG PO TABS: 1.0000 | ORAL_TABLET | Freq: Two times a day (BID) | ORAL | 0 refills | Status: AC

## 2020-12-17 MED ORDER — GLIPIZIDE 5 MG PO TABS: 10.0000 mg | ORAL_TABLET | Freq: Two times a day (BID) | ORAL | Status: DC

## 2020-12-17 MED ORDER — SACCHAROMYCES BOULARDII 250 MG PO CAPS: 250.0000 mg | ORAL_CAPSULE | Freq: Two times a day (BID) | ORAL | 0 refills | Status: AC

## 2020-12-17 MED ORDER — INSULIN GLARGINE 100 UNIT/ML ~~LOC~~ SOLN
10.0000 [IU] | SUBCUTANEOUS | Status: DC
  Administered 2020-12-17: 10 [IU] via SUBCUTANEOUS
  Filled 2020-12-17 (×2): qty 0.1

## 2020-12-17 NOTE — Progress Notes (Signed)
SATURATION QUALIFICATIONS: (This note is used to comply with regulatory documentation for home oxygen)  Patient Saturations on Room Air at Rest = 99%  Patient Saturations on Room Air while Ambulating = 95%  Patient Saturations on 0 Liters of oxygen while Ambulating = 95%  Heart Rate increased to the 130's with ambulation, pt states felt tired and some shortness of breathe.  Ambulated from room to front of nurses station then back to room.

## 2020-12-17 NOTE — Progress Notes (Signed)
Diabetes coordinator called to make aware of consult and discharge for today. Updated on some specific concerns involving patient with not realizing she had a glucometer at home, nor utilizing it. How food can alter blood sugar (refined foods, cake, simple carb choices noted this admission with little intake of vegetables or protein noted). Outpatient resources for patient as well.

## 2020-12-17 NOTE — Discharge Summary (Signed)
Physician Discharge Summary  Jenna [name] OEV:[id number] DOB: 06/18/1946 DOA: 12/15/2020  PCP: Ronnald Nian, DO  Admit date: 12/15/2020 Discharge date: 12/17/2020  Admitted From: Home Discharge disposition: Home   Code Status: Full Code  Diet Recommendation: Cardiac/diabetic diet  Discharge Diagnosis:   Principal Problem:   Pleural effusion on right Active Problems:   Type 2 diabetes mellitus without complication (HCC)   Iron deficiency anemia   Hypertension   HLD (hyperlipidemia)   Sepsis ([place])   CAP (community acquired pneumonia)   SOB (shortness of breath)   Chief Complaint  Patient presents with  . Shortness of Breath  . Headache    Brief narrative: Jenna [name] is a 75 y.o. female with PMH significant for DM2, HTN, CKD 3a, chronic anemia, non-small cell lung cancer, right pleural effusion in 2019, hypothyroidism Patient presented to ED on 12/15/2020 with shortness of breath for 3 to 4 days associated with productive cough  In the ED, patient had a fever of 101.9, blood pressure elevated up to 167/57  Maintaining oxygen saturation 94% 200% on room air at rest. Labs unremarkable. Blood cultures were collected, broad-spectrum antibiotic initiated. EKG showed sinus tachycardia with heart rate 100, and no evidence of T wave or ST changes CTA chest, which was compared to CT chest from 12/12/2020, showed no evidence of acute pulmonary embolism, and demonstrated stable moderate to large right sided pleural effusion, without significant change relative to this recent prior CT chest.  Patient was admitted to hospitalist service for further evaluation management.  Subjective: Patient was seen and examined this morning.  Sitting up at the edge of the bed.  Not in distress. Underwent thoracentesis yesterday.    Not on supplemental oxygen.  Able to ambulate on the hallway without supplemental oxygen.   Feels better than at presentation.  Feels ready to go  home.  Hospital course: Moderate to large right-sided pleural effusion -Presented with shortness of breath -CTA chest finding as above showing moderate to large right-sided pleural effusion in the context of non-small cell lung cancer -5/3, underwent ultrasound-guided thoracentesis, 400 mL of clear gold colored fluid was drained.  Fluid analysis sent including cytology, pending cytology report.  History of Non-small cell lung cancer -Follows up with Dr. Julien Nordmann as an outpatient.  She saw her few weeks ago and underwent a CT chest on 4/29 and a repeat CT on this admission.   -5/3, I made Dr. Julien Nordmann aware of patient's admission to the hospital.  No need of inpatient intervention.  Patient to follow-up with him as an outpatient in the office  Sepsis -Likely due to pneumonia -Presented with fever, tachycardia, tachypnea. -Unable to rule out underlying consolidation in the setting of large pleural effusion. -Blood cultures sent from ED. no growth so far -Currently on IV Rocephin and azithromycin.  We will discharge her on 5 more days of oral Augmentin with probiotics. Recent Labs  Lab 12/12/20 1213 12/15/20 1845 12/16/20 0211 12/16/20 0747 12/17/20 1036  WBC 4.6 6.7  --  3.9* 3.8*  LATICACIDVEN  --   --  1.2  --   --   PROCALCITON  --   --   --  0.22  --    Type 2 diabetes mellitus -A1c 8.8 on 5/3 which is better than 10.1 on January 2022 -Home meds include Lantus 10 units daily,  Farxiga 10 mg daily, glipizide 10 mg twice daily -Resume the same post discharge.  Essential hypertension -Home meds include Labetalol 50 mg  twice daily, losartan 50 mg daily, -Continue the same post discharge.               CKD stage IIIa -Creatinine remains at baseline.   Recent Labs    02/12/20 0000 02/12/20 0941 06/13/20 0926 08/28/20 1352 12/12/20 1213 12/15/20 1845 12/16/20 0747 12/17/20 1036  BUN 24* 24* 33* $Remov'23 23 18 19 'ZoLvYc$ 31*  CREATININE 1.4* 1.40* 1.46* 1.31* 1.38* 1.19* 1.17* 1.34*    Chronic iron deficiency anemia -Continue PPI.  Patient seems to be not taking iron supplement at home.  Hemoglobin remains stable. Recent Labs    05/28/20 1141 06/13/20 0926 08/28/20 1352 12/12/20 1213 12/15/20 1845 12/16/20 0747 12/17/20 1036  HGB 11.0*   < > 10.8* 10.1* 9.8* 9.6* 9.8*  MCV 84.8   < > 81.9 86.2 87.1 90.5 85.7  FERRITIN 74.2  --   --   --   --   --   --   IRON 50  --  60  --   --   --   --    < > = values in this interval not displayed.   Hyperlipidemia -Lipitor 40 mg daily  Hypothyroidism -Synthroid 75 mcg daily  Stable to discharge home today.   Wound care: Incision (Closed) 05/18/18 Neck Right (Active)  Date First Assessed/Time First Assessed: 05/18/18 0850   Location: Neck  Location Orientation: Right  Present on Admission: No    Assessments 05/18/2018  8:50 AM  Dressing Type Adhesive bandage  Dressing Dry;Intact;Clean  Dressing Change Frequency PRN  Site / Wound Assessment Clean;Dry  Closure None  Drainage Amount None  Drainage Description No odor  Treatment Cleansed     No Linked orders to display    Discharge Exam:   Vitals:   12/16/20 1410 12/16/20 1930 12/17/20 0507 12/17/20 0805  BP: (!) 141/47 (!) 164/57 (!) 125/48 (!) 146/72  Pulse: (!) 101 96 86   Resp: 17  18 (!) 25  Temp: 99.9 F (37.7 C) 98.1 F (36.7 C) 98.3 F (36.8 C) 98.5 F (36.9 C)  TempSrc:  Oral Oral Oral  SpO2: 96% 94% 97% 97%  Weight:      Height:        Body mass index is 30.29 kg/m.  General exam: Pleasant, elderly African-American female.  Sitting up at the edge of the bed.  Not in distress. Skin: No rashes, lesions or ulcers. HEENT: Atraumatic, normocephalic, no obvious bleeding Lungs: Clear to auscultation bilaterally CVS: Regular rate and rhythm, no murmur GI/Abd soft, nontender, nondistended, bowel sound present CNS: Alert, awake, oriented x3 Psychiatry: Mood appropriate Extremities: No pedal edema, no calf tenderness  Follow ups:    Discharge Instructions    Diet - low sodium heart healthy   Complete by: As directed    Diet Carb Modified   Complete by: As directed    Increase activity slowly   Complete by: As directed       Follow-up Information    Ronnald Nian, DO Follow up.   Specialty: Family Medicine Contact information: [place] [address] [phone]               Recommendations for Outpatient Follow-Up:   1. Follow-up with PCP as an outpatient 2. Follow-up with oncology as an outpatient  Discharge Instructions:  Follow with Primary MD Ronnald Nian, DO in 7 days   Get CBC/BMP checked in next visit within 1 week by PCP or SNF MD ( we routinely  change or add medications that can affect your baseline labs and fluid status, therefore we recommend that you get the mentioned basic workup next visit with your PCP, your PCP may decide not to get them or add new tests based on their clinical decision)  On your next visit with your PCP, please Get Medicines reviewed and adjusted.  Please request your PCP  to go over all Hospital Tests and Procedure/Radiological results at the follow up, please get all Hospital records sent to your Prim MD by signing hospital release before you go home.  Activity: As tolerated with Full fall precautions use walker/cane & assistance as needed  For Heart failure patients - Check your Weight same time everyday, if you gain over 2 pounds, or you develop in leg swelling, experience more shortness of breath or chest pain, call your Primary MD immediately. Follow Cardiac Low Salt Diet and 1.5 lit/day fluid restriction.  If you have smoked or chewed Tobacco in the last 2 yrs please stop smoking, stop any regular Alcohol  and or any Recreational drug use.  If you experience worsening of your admission symptoms, develop shortness of breath, life threatening emergency, suicidal or homicidal thoughts you must seek medical attention  immediately by calling 911 or calling your MD immediately  if symptoms less severe.  You Must read complete instructions/literature along with all the possible adverse reactions/side effects for all the Medicines you take and that have been prescribed to you. Take any new Medicines after you have completely understood and accpet all the possible adverse reactions/side effects.   Do not drive, operate heavy machinery, perform activities at heights, swimming or participation in water activities or provide baby sitting services if your were admitted for syncope or siezures until you have seen by Primary MD or a Neurologist and advised to do so again.  Do not drive when taking Pain medications.  Do not take more than prescribed Pain, Sleep and Anxiety Medications  Wear Seat belts while driving.   Please note You were cared for by a hospitalist during your hospital stay. If you have any questions about your discharge medications or the care you received while you were in the hospital after you are discharged, you can call the unit and asked to speak with the hospitalist on call if the hospitalist that took care of you is not available. Once you are discharged, your primary care physician will handle any further medical issues. Please note that NO REFILLS for any discharge medications will be authorized once you are discharged, as it is imperative that you return to your primary care physician (or establish a relationship with a primary care physician if you do not have one) for your aftercare needs so that they can reassess your need for medications and monitor your lab values.    Allergies as of 12/17/2020      Reactions   Paclitaxel Other (See Comments)   Unresponsiveness shortly after Taxol inf started 06/12/18.   Aspirin Other (See Comments)   Stomach bleeding    Esomeprazole Magnesium    UNSPECIFIED REACTION    Ace Inhibitors Other (See Comments)   Dizziness, drunk like      Medication List     TAKE these medications   amoxicillin-clavulanate 875-125 MG tablet Commonly known as: Augmentin Take 1 tablet by mouth 2 (two) times daily for 5 days.   atorvastatin 40 MG tablet Commonly known as: LIPITOR Take 1 tablet (40 mg total) by mouth daily. What changed: when to  take this   blood glucose meter kit and supplies Kit Use up to four times daily as directed   dapagliflozin propanediol 10 MG Tabs tablet Commonly known as: Farxiga Take 1 tablet (10 mg total) by mouth daily before breakfast.   Dexlansoprazole 30 MG capsule TAKE 1 CAPSULE BY MOUTH DAILY   diphenhydrAMINE 2 % cream Commonly known as: BENADRYL Apply 1 application topically as needed for itching.   docusate sodium 100 MG capsule Commonly known as: COLACE Take 100 mg by mouth daily as needed for mild constipation.   glipiZIDE 10 MG tablet Commonly known as: GLUCOTROL Take one tablet by mouth two times daily What changed:   how much to take  how to take this  when to take this  additional instructions   Iron 325 (65 Fe) MG Tabs TAKE 1 TABLET BY MOUTH TWICE DAILY WITH A MEAL   labetalol 100 MG tablet Commonly known as: NORMODYNE Take 0.5 tablets (50 mg total) by mouth 2 (two) times daily.   Lantus SoloStar 100 UNIT/ML Solostar Pen Generic drug: insulin glargine Inject 10 Units into the skin every morning. And 31G pen needles 1/day   levothyroxine 75 MCG tablet Commonly known as: SYNTHROID Take 1 tablet (75 mcg total) by mouth daily.   losartan 50 MG tablet Commonly known as: COZAAR Take 1 tablet (50 mg total) by mouth daily.   multivitamin with minerals Tabs tablet Take 1 tablet by mouth at bedtime.   Rybelsus 3 MG Tabs Generic drug: Semaglutide Take 3 mg by mouth daily.   saccharomyces boulardii 250 MG capsule Commonly known as: FLORASTOR Take 1 capsule (250 mg total) by mouth 2 (two) times daily for 5 days.       Time coordinating discharge: 35 minutes  The results of  significant diagnostics from this hospitalization (including imaging, microbiology, ancillary and laboratory) are listed below for reference.    Procedures and Diagnostic Studies:   DG Chest 1 View  Result Date: 12/16/2020 CLINICAL DATA:  Status post right thoracentesis. EXAM: CHEST  1 VIEW COMPARISON:  12/15/2020 FINDINGS: Persistent right lung base opacity is consistent with atelectasis and residual pleural fluid. Masslike opacity projecting from the right hilum is unchanged. No new lung abnormalities. No pneumothorax or evidence of a procedure complication. IMPRESSION: 1. No pneumothorax. Right perihilar and lung base opacities are similar to the prior exam. Electronically Signed   By: Lajean Manes M.D.   On: 12/16/2020 13:11   DG Chest 2 View  Result Date: 12/15/2020 CLINICAL DATA:  Short of breath and headache for 1 day, previous tobacco abuse, non-small cell lung cancer EXAM: CHEST - 2 VIEW COMPARISON:  05/08/2018, 12/12/2020 FINDINGS: Frontal and lateral views of the chest demonstrate a stable cardiac silhouette. Continued right perihilar consolidation consistent with prior radiation therapy. Stable volume loss within the right hemithorax. Right pleural effusion is again noted. No pneumothorax. Left chest is clear. IMPRESSION: 1. Stable post radiation changes right hilar region, unchanged since recent CT. 2. Right pleural effusion. Electronically Signed   By: Randa Ngo M.D.   On: 12/15/2020 19:27   CT Angio Chest PE W and/or Wo Contrast  Result Date: 12/16/2020 CLINICAL DATA:  Productive cough and headache. EXAM: CT ANGIOGRAPHY CHEST WITH CONTRAST TECHNIQUE: Multidetector CT imaging of the chest was performed using the standard protocol during bolus administration of intravenous contrast. Multiplanar CT image reconstructions and MIPs were obtained to evaluate the vascular anatomy. CONTRAST:  60mL OMNIPAQUE IOHEXOL 350 MG/ML SOLN COMPARISON:  December 12, 2020 FINDINGS: Cardiovascular: Moderate  severity calcification of the aortic arch is noted, without evidence of aneurysmal dilatation or dissection. Satisfactory opacification of the pulmonary arteries to the segmental level. No evidence of pulmonary embolism. Normal heart size with moderate severity coronary artery calcification. No pericardial effusion. Mediastinum/Nodes: No enlarged mediastinal, hilar, or axillary lymph nodes. The right paratracheal lymph node seen on the prior study is poorly visualized on the current exam. A stable 4 mm precarinal lymph node is noted (axial CT image 46, CT series number 5). Thyroid gland, trachea, and esophagus demonstrate no significant findings. Lungs/Pleura: A large area of right perihilar and right paramediastinal mass-like architectural distortion is seen. This is unchanged in appearance when compared to the prior study. A stable 5 mm noncalcified subpleural lung nodule is seen within the posterior aspect of the left lower lobe (axial CT image 102, CT series 11). A stable 4 mm linear nodule is seen within the left upper lobe (axial CT image 54, CT series 11). A stable moderate to large right-sided pleural effusion is seen. No pneumothorax is identified. Upper Abdomen: No acute abnormality. Musculoskeletal: A chronic appearing deformity is again seen involving the body of the sternum. No acute or significant osseous findings. Review of the MIP images confirms the above findings. IMPRESSION: 1. No evidence of pulmonary embolism. 2. Stable moderate to large right-sided pleural effusion. A malignant effusion cannot be excluded. Correlation with diagnostic thoracentesis should be considered. 3. Stable area of mass-like architectural distortion within the perihilar and paramediastinal regions on the right, suggestive of sequelae related tube external beam radiation. 4. Stable subcentimeter left upper lobe and left lower lobe noncalcified lung nodules. Electronically Signed   By: Aram Candela M.D.   On: 12/16/2020  01:38   IR THORACENTESIS ASP PLEURAL SPACE W/IMG GUIDE  Result Date: 12/16/2020 INDICATION: Patient with history of non-small cell lung cancer, dyspnea, and recurrent right pleural effusion. Request is made for diagnostic and therapeutic right thoracentesis. EXAM: ULTRASOUND GUIDED DIAGNOSTIC THERAPEUTIC RIGHT THORACENTESIS MEDICATIONS: 18 mL 1% lidocaine COMPLICATIONS: None immediate. PROCEDURE: An ultrasound guided thoracentesis was thoroughly discussed with the patient and questions answered. The benefits, risks, alternatives and complications were also discussed. The patient understands and wishes to proceed with the procedure. Written consent was obtained. Ultrasound was performed to localize and mark an adequate pocket of fluid in the right chest. The area was then prepped and draped in the normal sterile fashion. 1% Lidocaine was used for local anesthesia. Under ultrasound guidance a 6 Fr Safe-T-Centesis catheter was introduced. Thoracentesis was performed. The catheter was removed and a dressing applied. FINDINGS: A total of approximately 400 mL of clear gold fluid was removed. Samples were sent to the laboratory as requested by the clinical team. IMPRESSION: Successful ultrasound guided right thoracentesis yielding 400 mL of pleural fluid. Read by: Elwin Mocha, PA-C Electronically Signed   By: Malachy Moan M.D.   On: 12/16/2020 13:20     Labs:   Basic Metabolic Panel: Recent Labs  Lab 12/12/20 1213 12/15/20 1845 12/16/20 0747 12/17/20 1036  NA 141 137 137 134*  K 4.0 4.5 4.0 4.0  CL 109 106 107 105  CO2 22 23 21* 20*  GLUCOSE 260* 143* 182* 260*  BUN 23 18 19  31*  CREATININE 1.38* 1.19* 1.17* 1.34*  CALCIUM 9.1 9.1 8.6* 8.8*  MG  --   --  2.0  --   PHOS  --   --  3.3  --    GFR Estimated Creatinine Clearance:  39.1 mL/min (A) (by C-G formula based on SCr of 1.34 mg/dL (H)). Liver Function Tests: Recent Labs  Lab 12/12/20 1213 12/15/20 1845 12/16/20 0747  AST $Re'19 24  26  'hbb$ ALT $R'21 20 21  'LZ$ ALKPHOS 115 90 80  BILITOT 0.3 0.8 0.5  PROT 7.9 7.4 7.0  ALBUMIN 3.7 3.5 3.3*   No results for input(s): LIPASE, AMYLASE in the last 168 hours. No results for input(s): AMMONIA in the last 168 hours. Coagulation profile No results for input(s): INR, PROTIME in the last 168 hours.  CBC: Recent Labs  Lab 12/12/20 1213 12/15/20 1845 12/16/20 0747 12/17/20 1036  WBC 4.6 6.7 3.9* 3.8*  NEUTROABS 3.8 6.1 3.2 3.2  HGB 10.1* 9.8* 9.6* 9.8*  HCT 33.6* 32.3* 33.4* 32.3*  MCV 86.2 87.1 90.5 85.7  PLT 206 183 164 186   Cardiac Enzymes: No results for input(s): CKTOTAL, CKMB, CKMBINDEX, TROPONINI in the last 168 hours. BNP: Invalid input(s): POCBNP CBG: Recent Labs  Lab 12/16/20 0805 12/16/20 1325 12/16/20 1744 12/16/20 2033 12/17/20 0718  GLUCAP 161* 254* 322* 328* 230*   D-Dimer No results for input(s): DDIMER in the last 72 hours. Hgb A1c Recent Labs    12/16/20 0500  HGBA1C 8.8*   Lipid Profile No results for input(s): CHOL, HDL, LDLCALC, TRIG, CHOLHDL, LDLDIRECT in the last 72 hours. Thyroid function studies No results for input(s): TSH, T4TOTAL, T3FREE, THYROIDAB in the last 72 hours.  Invalid input(s): FREET3 Anemia work up No results for input(s): VITAMINB12, FOLATE, FERRITIN, TIBC, IRON, RETICCTPCT in the last 72 hours. Microbiology Recent Results (from the past 240 hour(s))  SARS CORONAVIRUS 2 (TAT 6-24 HRS) Nasopharyngeal Nasopharyngeal Swab     Status: None   Collection Time: 12/15/20  7:00 PM   Specimen: Nasopharyngeal Swab  Result Value Ref Range Status   SARS Coronavirus 2 NEGATIVE NEGATIVE Final    Comment: (NOTE) SARS-CoV-2 target nucleic acids are NOT DETECTED.  The SARS-CoV-2 RNA is generally detectable in upper and lower respiratory specimens during the acute phase of infection. Negative results do not preclude SARS-CoV-2 infection, do not rule out co-infections with other pathogens, and should not be used as the sole  basis for treatment or other patient management decisions. Negative results must be combined with clinical observations, patient history, and epidemiological information. The expected result is Negative.  Fact Sheet for Patients: SugarRoll.be  Fact Sheet for Healthcare Providers: https://www.woods-mathews.com/  This test is not yet approved or cleared by the [place] FDA and  has been authorized for detection and/or diagnosis of SARS-CoV-2 by FDA under an Emergency Use Authorization (EUA). This EUA will remain  in effect (meaning this test can be used) for the duration of the COVID-19 declaration under Se ction 564(b)(1) of the Act, 21 U.S.C. section 360bbb-3(b)(1), unless the authorization is terminated or revoked sooner.  Performed at [facility], [place] [address] [place], [place] [address]   Blood culture (routine x 2)     Status: None (Preliminary result)   Collection Time: 12/16/20  2:15 AM   Specimen: BLOOD  Result Value Ref Range Status   Specimen Description BLOOD RIGHT ARM  Final   Special Requests   Final    BOTTLES DRAWN AEROBIC AND ANAEROBIC Blood Culture adequate volume   Culture   Final    NO GROWTH 1 DAY Performed at [facility], [place] [address], [place], [place] [address]    Report Status PENDING  Incomplete  Blood culture (routine x 2)  Status: None (Preliminary result)   Collection Time: 12/16/20  2:18 AM   Specimen: BLOOD RIGHT HAND  Result Value Ref Range Status   Specimen Description BLOOD RIGHT HAND  Final   Special Requests   Final    BOTTLES DRAWN AEROBIC ONLY Blood Culture adequate volume   Culture   Final    NO GROWTH 1 DAY Performed at [facility], [address], [place], [place] [address]    Report Status PENDING  Incomplete  Gram stain     Status: None   Collection Time: 12/16/20 12:22 PM   Specimen: Lung, Right; Pleural Fluid  Result Value Ref Range Status   Specimen  Description FLUID  Final   Special Requests LUNG RIGHT  Final   Gram Stain   Final    WBC PRESENT,BOTH PMN AND MONONUCLEAR NO ORGANISMS SEEN CYTOSPIN SMEAR Performed at [facility], [address], [place], [place] [address]    Report Status 12/16/2020 FINAL  Final     Signed: Marlowe Aschoff Kimbrely Buckel  [facility] 12/17/2020, 11:43 AM

## 2020-12-17 NOTE — Progress Notes (Signed)
Pt ride to get here sometime around 1500 per pt. Education provided. IV site to be removed before leaving. Pt made aware.

## 2020-12-17 NOTE — Progress Notes (Signed)
Inpatient Diabetes Program Recommendations  AACE/ADA: New Consensus Statement on Inpatient Glycemic Control  Target Ranges:  Prepandial:   less than 140 mg/dL      Peak postprandial:   less than 180 mg/dL (1-2 hours)      Critically ill patients:  140 - 180 mg/dL   Results for Jenna [name], Jenna [name] (MRN [id number]) as of 12/17/2020 14:02  Ref. Range 12/16/2020 08:05 12/16/2020 13:25 12/16/2020 17:44 12/16/2020 20:33 12/17/2020 07:18 12/17/2020 11:55  Glucose-Capillary Latest Ref Range: 70 - 99 mg/dL 161 (H) 254 (H) 322 (H) 328 (H) 230 (H) 247 (H)  Results for Jenna [name], Jenna [name] (MRN [id number]) as of 12/17/2020 14:02  Ref. Range 08/28/2020 13:23 12/16/2020 05:00  Hemoglobin A1C Latest Ref Range: 4.8 - 5.6 % 10.1 (A) 8.8 (H)   Review of Glycemic Control  Diabetes history: DM2 Outpatient Diabetes medications: Lantus 10 units QAM, Glipizide 10 mg BID, Farxiga 10 mg daily Current orders for Inpatient glycemic control: Lantus 6 units QAM, Novolog 0-9 units TID with meals, Novolog 0-5 units QHS, Glipizide 10 mg BID, Farxiga 10 mg QAM  NOTE: Spoke with patient at bedside about diabetes and home regimen for diabetes control. Patient reports being followed by Dr. Loanne Drilling for diabetes management and currently taking Lantus 10 units QAM, Glipizide 10 mg BID, and Farxiga 10 mg daily as an outpatient for diabetes control. Patient reports taking DM medications as prescribed and last seen Dr. Loanne Drilling 08/28/20. At her last visit, Lantus was started an dpatient has been taking all DM medications consistently as prescribed. Patient is not checking glucose at home and reports that she has everything she needs for glucose monitoring but states "I just don't like needles so I don't like to check my sugar."   Explained importance of having glucose value trends to help determine changes with DM medications versus just using an A1C. Discussed A1C results (8.8% on 12/16/20 ) and explained that current A1C indicates an average glucose of  206 mg/dl over the past 2-3 months. Prior A1C 10.1% on 08/28/20; commended patient on improving A1C from 10.1% to 8.8%.  Discussed glucose and A1C goals and encouraged patient to work toward getting A1C down to 7% or less. Discussed importance of checking CBGs and maintaining good CBG control to prevent long-term and short-term complications. Explained how hyperglycemia leads to damage within blood vessels which lead to the common complications seen with uncontrolled diabetes. Stressed to the patient the importance of improving glycemic control to prevent further complications from uncontrolled diabetes. Discussed impact of nutrition, exercise, stress, sickness, and medications on diabetes control.  Discussed carbohydrates, carbohydrate goals per day and meal, along with portion sizes. Patient states that she drinks mainly water and that she drinks regular sodas about once a day. Encouraged patient to try to use diet sodas and eliminate all regular sodas if possible. Encouraged patient to start checking glucose at least once a day and patient states she will try to start checking at least once a day. Patient verbalized understanding of information discussed and reports no further questions at this time related to diabetes.  Thanks, Barnie Alderman, RN, MSN, CDE Diabetes Coordinator Inpatient Diabetes Program [phone] (Team Pager)

## 2020-12-18 ENCOUNTER — Telehealth: Payer: Self-pay

## 2020-12-18 NOTE — Telephone Encounter (Signed)
Transition Care Management Follow-up Telephone Call  Date of discharge and from where: 12/17/20-[facility]  How have you been since you were released from the hospital? A little better but still coughing.  Any questions or concerns? No  Items Reviewed:  Did the pt receive and understand the discharge instructions provided? Yes   Medications obtained and verified? Yes   Other? Yes   Any new allergies since your discharge? No   Dietary orders reviewed? Yes  Do you have support at home? No but she says her daughter lives close by  [place] and Equipment/Supplies: Were home health services ordered? no If so, what is the name of the agency? n/a  Has the agency set up a time to come to the patient's home? not applicable Were any new equipment or medical supplies ordered?  No What is the name of the medical supply agency? n/a Were you able to get the supplies/equipment? not applicable Do you have any questions related to the use of the equipment or supplies? n/a  Functional Questionnaire: (I = Independent and D = Dependent) ADLs: I  Bathing/Dressing- I  Meal Prep- I  Eating- I  Maintaining continence- I  Transferring/Ambulation- I  Managing Meds- I  Follow up appointments reviewed:   PCP Hospital f/u appt confirmed? Yes  Scheduled to see Dr. Bryan Lemma on 12/25/20 @ 11:00.  [facility] f/u appt confirmed? Yes  Scheduled to see Dr. Julien Nordmann on 01/06/21 @ 11:15.  Are transportation arrangements needed? No   If their condition worsens, is the pt aware to call PCP or go to the Emergency Dept.? Yes  Was the patient provided with contact information for the PCP's office or ED? Yes  Was to pt encouraged to call back with questions or concerns? Yes

## 2020-12-21 LAB — CULTURE, BLOOD (ROUTINE X 2)
Culture: NO GROWTH
Culture: NO GROWTH
Special Requests: ADEQUATE
Special Requests: ADEQUATE

## 2020-12-24 ENCOUNTER — Other Ambulatory Visit: Payer: Self-pay

## 2020-12-25 ENCOUNTER — Ambulatory Visit (INDEPENDENT_AMBULATORY_CARE_PROVIDER_SITE_OTHER): Payer: Medicare Other | Admitting: Family Medicine

## 2020-12-25 ENCOUNTER — Encounter: Payer: Self-pay | Admitting: Family Medicine

## 2020-12-25 ENCOUNTER — Ambulatory Visit (INDEPENDENT_AMBULATORY_CARE_PROVIDER_SITE_OTHER): Payer: Medicare Other

## 2020-12-25 VITALS — BP 140/70 | HR 93 | Temp 97.1°F | Ht 65.0 in | Wt 180.8 lb

## 2020-12-25 DIAGNOSIS — Z09 Encounter for follow-up examination after completed treatment for conditions other than malignant neoplasm: Secondary | ICD-10-CM | POA: Diagnosis not present

## 2020-12-25 DIAGNOSIS — J90 Pleural effusion, not elsewhere classified: Secondary | ICD-10-CM | POA: Diagnosis not present

## 2020-12-25 DIAGNOSIS — J189 Pneumonia, unspecified organism: Secondary | ICD-10-CM | POA: Diagnosis not present

## 2020-12-25 DIAGNOSIS — R062 Wheezing: Secondary | ICD-10-CM | POA: Diagnosis not present

## 2020-12-25 MED ORDER — METHYLPREDNISOLONE ACETATE 80 MG/ML IJ SUSP: 60.0000 mg | Freq: Once | INTRAMUSCULAR | Status: DC

## 2020-12-25 MED ORDER — IRON 325 (65 FE) MG PO TABS: 1.0000 | ORAL_TABLET | Freq: Two times a day (BID) | ORAL | 0 refills | Status: AC

## 2020-12-25 NOTE — Progress Notes (Signed)
Jenna [name] is a 75 y.o. female  Chief Complaint  Patient presents with  . Hospitalization Follow-up    Pt here for 2 day hospital stay.    HPI: Jenna [name] is a 75 y.o. female patient seen today for hospital follow-up. She was admitted to [facility] on 12/15/20 and discharged on 12/17/20. She went to the ER d/t cough and increasing SOB x 3-4 days. She was diagnosed with moderate to large Rt pleural effusion and pneumonia. Pt was treated with abx and discharged on 5 days of PO Augmentin for the pneumonia. While hospitalized she underwent ultrasound-guided thoracentesis, 400 mL of clear gold colored fluid was drained. Hospital notes, tests, labs reviewed by me today.  Today pt states her breathing is better. She denies SOB at rest but endorses this with minimal exertion. Cough is improving, coughing less. No fever, chills.  Sleeping well.   Past Medical History:  Diagnosis Date  . Blood transfusion without reported diagnosis   . Cataract   . DM (diabetes mellitus) ([place])   . GERD (gastroesophageal reflux disease)   . Hyperlipidemia   . Hypertension   . Iron deficiency anemia   . NSCL ca dx'd 03/2018  . Thyroid disease     Past Surgical History:  Procedure Laterality Date  . CATARACT EXTRACTION Right   . CHOLECYSTECTOMY    . COLONOSCOPY  10/24/2009   normal rectum/1X1cm abnormal lesion in the ascending colon (bx benign). TI normal for 10cm.  Prep difficult/inadequate. f/u TCS 09/2012 recommended  . COLONOSCOPY  10/13/2004   Normal rectum/Diminutive polyps, splenic flexure, cold biopsied/removed.  Remainder of colonic mucosa appeared normal.  . COLONOSCOPY N/A 12/14/2012   [id number] polyp-tubular adenoma  . ESOPHAGOGASTRODUODENOSCOPY  10/13/2004    Normal esophagus/ Nodular volcano like lesion in the antrum, either representing a  pancreatic rest or leiomyoma, biopsied.  Remainder of the gastric mucosa appeared normal, normal D1-D2  . ESOPHAGOGASTRODUODENOSCOPY  10/24/2009    Benign biopsies. normal esophagus/small hiatal hernia/nodular lesion antrum/distal greater curvature. duodenal AVM s/p ablation  . GIVENS CAPSULE STUDY  07/27/2010    multiple arteriovenous malformations which could definitely be the contributor to her drifting hemoglobin and hematocrit  . IR THORACENTESIS ASP PLEURAL SPACE W/IMG GUIDE  12/16/2020  . TUBAL LIGATION    . VIDEO BRONCHOSCOPY WITH ENDOBRONCHIAL ULTRASOUND N/A 04/27/2018   Procedure: VIDEO BRONCHOSCOPY WITH ENDOBRONCHIAL ULTRASOUND;  Surgeon: Melrose Nakayama, MD;  Location: [facility] OR;  Service: Thoracic;  Laterality: N/A;    Social History   Socioeconomic History  . Marital status: Single    Spouse name: Not on file  . Number of children: 3  . Years of education: Not on file  . Highest education level: Not on file  Occupational History  . Occupation: retired  Tobacco Use  . Smoking status: Former Smoker    Packs/day: 0.50    Years: 55.00    Pack years: 27.50    Types: Cigarettes    Quit date: 03/18/2018    Years since quitting: 2.7  . Smokeless tobacco: Never Used  . Tobacco comment: smoked off and n  Vaping Use  . Vaping Use: Never used  Substance and Sexual Activity  . Alcohol use: No  . Drug use: No  . Sexual activity: Not Currently  Other Topics Concern  . Not on file  Social History Narrative   Patient fully vaccinated   Social Determinants of Health   Financial Resource Strain: Low Risk   . Difficulty of Paying  Living Expenses: Not hard at all  Food Insecurity: No Food Insecurity  . Worried About Charity fundraiser in the Last Year: Never true  . Ran Out of Food in the Last Year: Never true  Transportation Needs: No Transportation Needs  . Lack of Transportation (Medical): No  . Lack of Transportation (Non-Medical): No  Physical Activity: Inactive  . Days of Exercise per Week: 0 days  . Minutes of Exercise per Session: 0 min  Stress: No Stress Concern Present  . Feeling of Stress : Not at all   Social Connections: Socially Isolated  . Frequency of Communication with Friends and Family: More than three times a week  . Frequency of Social Gatherings with Friends and Family: More than three times a week  . Attends Religious Services: Never  . Active Member of Clubs or Organizations: No  . Attends Archivist Meetings: Never  . Marital Status: Never married  Intimate Partner Violence: Not At Risk  . Fear of Current or Ex-Partner: No  . Emotionally Abused: No  . Physically Abused: No  . Sexually Abused: No    Family History  Problem Relation Age of Onset  . Diabetes Sister   . Colon cancer Maternal Aunt        greater than age 21  . Breast cancer Cousin   . Diabetes Daughter   . Diabetes Daughter   . Amblyopia Neg Hx   . Blindness Neg Hx   . Cataracts Neg Hx   . Glaucoma Neg Hx   . Macular degeneration Neg Hx   . Retinal detachment Neg Hx   . Strabismus Neg Hx   . Retinitis pigmentosa Neg Hx   . Rectal cancer Neg Hx   . Stomach cancer Neg Hx   . Colon polyps Neg Hx   . Esophageal cancer Neg Hx      Immunization History  Administered Date(s) Administered  . Fluad Quad(high Dose 65+) 05/31/2019, 09/12/2020  . Influenza-Unspecified 04/16/2014  . PFIZER(Purple Top)SARS-COV-2 Vaccination 11/22/2019, 12/17/2019  . Pneumococcal Conjugate-13 06/10/2014    Outpatient Encounter Medications as of 12/25/2020  Medication Sig  . atorvastatin (LIPITOR) 40 MG tablet Take 1 tablet (40 mg total) by mouth daily. (Patient taking differently: Take 40 mg by mouth at bedtime.)  . blood glucose meter kit and supplies KIT Use up to four times daily as directed  . dapagliflozin propanediol (FARXIGA) 10 MG TABS tablet Take 1 tablet (10 mg total) by mouth daily before breakfast.  . Dexlansoprazole 30 MG capsule TAKE 1 CAPSULE BY MOUTH DAILY (Patient taking differently: Take 30 mg by mouth daily.)  . diphenhydrAMINE (BENADRYL) 2 % cream Apply 1 application topically as needed for  itching.  . docusate sodium (COLACE) 100 MG capsule Take 100 mg by mouth daily as needed for mild constipation.  [name] glipiZIDE (GLUCOTROL) 10 MG tablet Take one tablet by mouth two times daily (Patient taking differently: Take 10 mg by mouth 2 (two) times daily before a meal.)  . insulin glargine (LANTUS SOLOSTAR) 100 UNIT/ML Solostar Pen Inject 10 Units into the skin every morning. And 31G pen needles 1/day  . labetalol (NORMODYNE) 100 MG tablet Take 0.5 tablets (50 mg total) by mouth 2 (two) times daily.  [name] levothyroxine (SYNTHROID) 75 MCG tablet Take 1 tablet (75 mcg total) by mouth daily.  [name] losartan (COZAAR) 50 MG tablet Take 1 tablet (50 mg total) by mouth daily.  . Multiple Vitamin (MULTIVITAMIN WITH MINERALS) TABS tablet Take 1 tablet  by mouth at bedtime.   . Ferrous Sulfate (IRON) 325 (65 Fe) MG TABS Take 1 tablet (325 mg total) by mouth 2 (two) times daily with a meal.  . Semaglutide (RYBELSUS) 3 MG TABS Take 3 mg by mouth daily. (Patient not taking: No sig reported)  . [DISCONTINUED] Ferrous Sulfate (IRON) 325 (65 Fe) MG TABS TAKE 1 TABLET BY MOUTH TWICE DAILY WITH A MEAL (Patient not taking: No sig reported)   Facility-Administered Encounter Medications as of 12/25/2020  Medication  . methylPREDNISolone acetate (DEPO-MEDROL) injection 60 mg  . promethazine (PHENERGAN) injection 12.5 mg  . Tbo-Filgrastim (GRANIX) injection 300 mcg     ROS: Pertinent positives and negatives noted in HPI. Remainder of ROS non-contributory   Allergies  Allergen Reactions  . Paclitaxel Other (See Comments)    Unresponsiveness shortly after Taxol inf started 06/12/18.  . Aspirin Other (See Comments)    Stomach bleeding   . Esomeprazole Magnesium     UNSPECIFIED REACTION   . Ace Inhibitors Other (See Comments)    Dizziness, drunk like    BP 140/70 (BP Location: Left Arm, Patient Position: Sitting, Cuff Size: Normal)   Pulse 93   Temp (!) 97.1 F (36.2 C) (Temporal)   Ht _0  (1.651 m)   Wt  180 lb 12.8 oz (82 kg)   SpO2 93%   BMI 30.09 kg/m   Wt Readings from Last 3 Encounters:  12/25/20 180 lb 12.8 oz (82 kg)  12/16/20 182 lb (82.6 kg)  09/30/20 182 lb (82.6 kg)   Temp Readings from Last 3 Encounters:  12/25/20 (!) 97.1 F (36.2 C) (Temporal)  12/17/20 98.5 F (36.9 C) (Oral)  09/12/20 (!) 97 F (36.1 C) (Temporal)   BP Readings from Last 3 Encounters:  12/25/20 140/70  12/17/20 (!) 146/72  09/12/20 (!) 142/66   Pulse Readings from Last 3 Encounters:  12/25/20 93  12/17/20 86  09/12/20 82     Physical Exam Constitutional:      General: She is not in acute distress.    Appearance: Normal appearance. She is not ill-appearing.  Cardiovascular:     Rate and Rhythm: Normal rate and regular rhythm.  Pulmonary:     Effort: Pulmonary effort is normal.     Breath sounds: No stridor. Wheezing present. No rhonchi or rales.  Musculoskeletal:     Right lower leg: No edema.     Left lower leg: No edema.  Neurological:     Mental Status: She is alert and oriented to person, place, and time.  Psychiatric:        Behavior: Behavior normal.      A/P:  1. Hospital discharge follow-up 2. Pneumonia due to infectious organism, unspecified laterality, unspecified part of lung 3. Pleural effusion on right 4. Wheezing - pt completed oral antibiotics s/p discharge - overall symptoms improving but still some cough and DOE - DG Chest 2 View Rx: - methylPREDNISolone acetate (DEPO-MEDROL) injection 60 mg x 1 in office today - f/u if symptoms worsen or do not continue to improve in the next 7-10 days Discussed plan and reviewed medications with patient, including risks, benefits, and potential side effects. Pt expressed understand. All questions answered.    This visit occurred during the SARS-CoV-2 public health emergency.  Safety protocols were in place, including screening questions prior to the visit, additional usage of staff PPE, and extensive cleaning of exam  room while observing appropriate contact time as indicated for disinfecting solutions.

## 2020-12-31 ENCOUNTER — Telehealth: Payer: Self-pay

## 2020-12-31 DIAGNOSIS — D509 Iron deficiency anemia, unspecified: Secondary | ICD-10-CM

## 2020-12-31 NOTE — Telephone Encounter (Signed)
Dr. Havery Moros, patient is due for repeat lab work. She had labs drawn on 12/17/20. Please advise, thanks.

## 2020-12-31 NOTE — Telephone Encounter (Signed)
Spoke with patient and she is aware of recommendations.   Lab order and reminder in epic.

## 2020-12-31 NOTE — Addendum Note (Signed)
Addended by: Yevette Edwards on: 12/31/2020 12:40 PM   Modules accepted: Orders

## 2020-12-31 NOTE — Telephone Encounter (Signed)
Thanks [place], she has chronic anemia and labs were drawn during a hospital stay when she was admitted for a few days. Would repeat a CBC in 3 months to make sure stable, continue iron. Thanks

## 2020-12-31 NOTE — Telephone Encounter (Signed)
-----   Message from Yevette Edwards, RN sent at 09/02/2020 12:49 PM EST ----- Regarding: Labs Repeat CBC. Order in epic.

## 2021-01-06 ENCOUNTER — Encounter: Payer: Self-pay | Admitting: Internal Medicine

## 2021-01-06 ENCOUNTER — Other Ambulatory Visit: Payer: Self-pay

## 2021-01-06 ENCOUNTER — Inpatient Hospital Stay: Payer: Medicare Other | Attending: Internal Medicine | Admitting: Internal Medicine

## 2021-01-06 VITALS — BP 138/71 | HR 93 | Temp 97.7°F | Resp 18 | Ht 65.0 in | Wt 180.8 lb

## 2021-01-06 DIAGNOSIS — C3490 Malignant neoplasm of unspecified part of unspecified bronchus or lung: Secondary | ICD-10-CM

## 2021-01-06 DIAGNOSIS — Z7984 Long term (current) use of oral hypoglycemic drugs: Secondary | ICD-10-CM | POA: Insufficient documentation

## 2021-01-06 DIAGNOSIS — Z79899 Other long term (current) drug therapy: Secondary | ICD-10-CM | POA: Insufficient documentation

## 2021-01-06 DIAGNOSIS — E039 Hypothyroidism, unspecified: Secondary | ICD-10-CM | POA: Diagnosis not present

## 2021-01-06 DIAGNOSIS — C342 Malignant neoplasm of middle lobe, bronchus or lung: Secondary | ICD-10-CM

## 2021-01-06 DIAGNOSIS — Z9221 Personal history of antineoplastic chemotherapy: Secondary | ICD-10-CM | POA: Diagnosis not present

## 2021-01-06 DIAGNOSIS — Z923 Personal history of irradiation: Secondary | ICD-10-CM | POA: Diagnosis not present

## 2021-01-06 DIAGNOSIS — Z85118 Personal history of other malignant neoplasm of bronchus and lung: Secondary | ICD-10-CM | POA: Diagnosis not present

## 2021-01-06 NOTE — Progress Notes (Signed)
[place] [phone]   [phone]  OFFICE PROGRESS NOTE  [place] [address]  DIAGNOSIS: Stage IIIB (T3,N3, M0)non-small cell lung cancer, squamous cell carcinoma presented with large right middle lobe lung breast in addition to mediastinal and bilateral hilar lymphadenopathy and suspicious right supraclavicular lymph node diagnosed in August 2019.   PRIOR THERAPY:  1) A course of concurrent chemoradiation with weekly carboplatin for AUC of 2 and paclitaxel 45 MG/M2.  First dose of chemotherapy given on 05/29/2018.  Status post 5 cycles. 2) Consolidation treatment with immunotherapy with Imfinzi 10 mg/KG every 2 weeks.  First dose August 10, 2018.  Status post 26 cycles.  CURRENT THERAPY:  Observation.  INTERVAL HISTORY: Jenna [name] 75 y.o. female returns to the clinic today for follow-up visit.  The patient is feeling fine today with no concerning complaints.  She was seen at the emergency department few weeks ago for shortness of breath and imaging studies showed no concerning findings.  She had ultrasound-guided thoracentesis at that time and the final cytology from the pleural fluid was negative for malignancy.  She has no current chest pain but has shortness of breath with exertion with mild cough and no hemoptysis.  She denied having any fever or chills.  She has no nausea, vomiting, diarrhea or constipation.  She has no headache or visual changes.  She is here today for evaluation and discussion of her lab and scan results.  MEDICAL HISTORY: Past Medical History:  Diagnosis Date  . Blood transfusion without reported diagnosis   . Cataract   . DM (diabetes mellitus) ([place])   . GERD (gastroesophageal reflux disease)   . Hyperlipidemia   . Hypertension   . Iron deficiency anemia   . NSCL ca dx'd 03/2018  . Thyroid disease     ALLERGIES:  is allergic to paclitaxel,  aspirin, esomeprazole magnesium, and ace inhibitors.  MEDICATIONS:  Current Outpatient Medications  Medication Sig Dispense Refill  . atorvastatin (LIPITOR) 40 MG tablet Take 1 tablet (40 mg total) by mouth daily. (Patient taking differently: Take 40 mg by mouth at bedtime.) 90 tablet 3  . blood glucose meter kit and supplies KIT Use up to four times daily as directed 1 each 0  . dapagliflozin propanediol (FARXIGA) 10 MG TABS tablet Take 1 tablet (10 mg total) by mouth daily before breakfast. 90 tablet 3  . Dexlansoprazole 30 MG capsule TAKE 1 CAPSULE BY MOUTH DAILY (Patient taking differently: Take 30 mg by mouth daily.) 30 capsule 3  . diphenhydrAMINE (BENADRYL) 2 % cream Apply 1 application topically as needed for itching.    . docusate sodium (COLACE) 100 MG capsule Take 100 mg by mouth daily as needed for mild constipation.    . Ferrous Sulfate (IRON) 325 (65 Fe) MG TABS Take 1 tablet (325 mg total) by mouth 2 (two) times daily with a meal. 180 tablet 0  . glipiZIDE (GLUCOTROL) 10 MG tablet Take one tablet by mouth two times daily (Patient taking differently: Take 10 mg by mouth 2 (two) times daily before a meal.) 180 tablet 3  . insulin glargine (LANTUS SOLOSTAR) 100 UNIT/ML Solostar Pen Inject 10 Units into the skin every morning. And 31G pen needles 1/day 15 mL PRN  . labetalol (NORMODYNE) 100 MG tablet Take 0.5 tablets (50 mg total) by mouth 2 (two) times daily. 90 tablet 3  . levothyroxine (SYNTHROID) 75 MCG tablet Take 1  tablet (75 mcg total) by mouth daily. 90 tablet 3  . losartan (COZAAR) 50 MG tablet Take 1 tablet (50 mg total) by mouth daily. 90 tablet 3  . Multiple Vitamin (MULTIVITAMIN WITH MINERALS) TABS tablet Take 1 tablet by mouth at bedtime.      Current Facility-Administered Medications  Medication Dose Route Frequency Provider Last Rate Last Admin  . methylPREDNISolone acetate (DEPO-MEDROL) injection 60 mg  60 mg Intramuscular Once Cirigliano, Mary K, DO        Facility-Administered Medications Ordered in Other Visits  Medication Dose Route Frequency Provider Last Rate Last Admin  . promethazine (PHENERGAN) injection 12.5 mg  12.5 mg Intravenous Once Harle Stanford., PA-C      . Tbo-Filgrastim (GRANIX) injection 300 mcg  300 mcg Subcutaneous Once Curt Bears, MD        SURGICAL HISTORY:  Past Surgical History:  Procedure Laterality Date  . CATARACT EXTRACTION Right   . CHOLECYSTECTOMY    . COLONOSCOPY  10/24/2009   normal rectum/1X1cm abnormal lesion in the ascending colon (bx benign). TI normal for 10cm.  Prep difficult/inadequate. f/u TCS 09/2012 recommended  . COLONOSCOPY  10/13/2004   Normal rectum/Diminutive polyps, splenic flexure, cold biopsied/removed.  Remainder of colonic mucosa appeared normal.  . COLONOSCOPY N/A 12/14/2012   EYC:XKGYJEH polyp-tubular adenoma  . ESOPHAGOGASTRODUODENOSCOPY  10/13/2004    Normal esophagus/ Nodular volcano like lesion in the antrum, either representing a  pancreatic rest or leiomyoma, biopsied.  Remainder of the gastric mucosa appeared normal, normal D1-D2  . ESOPHAGOGASTRODUODENOSCOPY  10/24/2009   Benign biopsies. normal esophagus/small hiatal hernia/nodular lesion antrum/distal greater curvature. duodenal AVM s/p ablation  . GIVENS CAPSULE STUDY  07/27/2010    multiple arteriovenous malformations which could definitely be the contributor to her drifting hemoglobin and hematocrit  . IR THORACENTESIS ASP PLEURAL SPACE W/IMG GUIDE  12/16/2020  . TUBAL LIGATION    . VIDEO BRONCHOSCOPY WITH ENDOBRONCHIAL ULTRASOUND N/A 04/27/2018   Procedure: VIDEO BRONCHOSCOPY WITH ENDOBRONCHIAL ULTRASOUND;  Surgeon: Melrose Nakayama, MD;  Location: [facility] OR;  Service: Thoracic;  Laterality: N/A;    REVIEW OF SYSTEMS:  A comprehensive review of systems was negative except for: Respiratory: positive for cough and dyspnea on exertion   PHYSICAL EXAMINATION: General appearance: alert, cooperative and no distress Head:  Normocephalic, without obvious abnormality, atraumatic Neck: no adenopathy, no JVD, supple, symmetrical, trachea midline and thyroid not enlarged, symmetric, no tenderness/mass/nodules Lymph nodes: Cervical, supraclavicular, and axillary nodes normal. Resp: clear to auscultation bilaterally Back: symmetric, no curvature. ROM normal. No CVA tenderness. Cardio: regular rate and rhythm, S1, S2 normal, no murmur, click, rub or gallop GI: soft, non-tender; bowel sounds normal; no masses,  no organomegaly Extremities: extremities normal, atraumatic, no cyanosis or edema  ECOG PERFORMANCE STATUS: 1 - Symptomatic but completely ambulatory  Blood pressure 138/71, pulse 93, temperature 97.7 F (36.5 C), temperature source Tympanic, resp. rate 18, height $RemoveBe'5\' 5"'PGTKsGOCj$  (1.651 m), weight 180 lb 12.8 oz (82 kg), SpO2 98 %.  LABORATORY DATA: Lab Results  Component Value Date   WBC 3.8 (L) 12/17/2020   HGB 9.8 (L) 12/17/2020   HCT 32.3 (L) 12/17/2020   MCV 85.7 12/17/2020   PLT 186 12/17/2020      Chemistry      Component Value Date/Time   NA 134 (L) 12/17/2020 1036   K 4.0 12/17/2020 1036   CL 105 12/17/2020 1036   CO2 20 (L) 12/17/2020 1036   BUN 31 (H) 12/17/2020 1036   BUN 24 (  A) 02/12/2020 0000   CREATININE 1.34 (H) 12/17/2020 1036   CREATININE 1.38 (H) 12/12/2020 1213   CREATININE 0.72 12/10/2011 0919      Component Value Date/Time   CALCIUM 8.8 (L) 12/17/2020 1036   ALKPHOS 80 12/16/2020 0747   ALKPHOS 88 12/10/2011 0919   AST 26 12/16/2020 0747   AST 19 12/12/2020 1213   ALT 21 12/16/2020 0747   ALT 21 12/12/2020 1213   BILITOT 0.5 12/16/2020 0747   BILITOT 0.3 12/12/2020 1213       RADIOGRAPHIC STUDIES: DG Chest 1 View  Result Date: 12/16/2020 CLINICAL DATA:  Status post right thoracentesis. EXAM: CHEST  1 VIEW COMPARISON:  12/15/2020 FINDINGS: Persistent right lung base opacity is consistent with atelectasis and residual pleural fluid. Masslike opacity projecting from the right  hilum is unchanged. No new lung abnormalities. No pneumothorax or evidence of a procedure complication. IMPRESSION: 1. No pneumothorax. Right perihilar and lung base opacities are similar to the prior exam. Electronically Signed   By: Lajean Manes M.D.   On: 12/16/2020 13:11   DG Chest 2 View  Result Date: 12/27/2020 CLINICAL DATA:  Status post right thoracentesis, follow-up pneumonia. EXAM: CHEST - 2 VIEW COMPARISON:  12/16/2020 FINDINGS: Persistent right perihilar changes are noted consistent with prior radiation therapy and fibrosis. Triangular shaped density is noted in the right base in part due to focal eventration of the right hemidiaphragm. Minimal right-sided effusion is noted laterally. This is slightly greater than that seen on the prior exam. Left lung remains clear. No bony abnormality is seen. IMPRESSION: Small recurrent pleural effusion. Focal anterior eventration of the right hemidiaphragm similar to that seen on prior CT. Changes in the right perihilar region consistent with prior radiation therapy. Electronically Signed   By: Inez [place] M.D.   On: 12/27/2020 11:40   DG Chest 2 View  Result Date: 12/15/2020 CLINICAL DATA:  Short of breath and headache for 1 day, previous tobacco abuse, non-small cell lung cancer EXAM: CHEST - 2 VIEW COMPARISON:  05/08/2018, 12/12/2020 FINDINGS: Frontal and lateral views of the chest demonstrate a stable cardiac silhouette. Continued right perihilar consolidation consistent with prior radiation therapy. Stable volume loss within the right hemithorax. Right pleural effusion is again noted. No pneumothorax. Left chest is clear. IMPRESSION: 1. Stable post radiation changes right hilar region, unchanged since recent CT. 2. Right pleural effusion. Electronically Signed   By: Randa Ngo M.D.   On: 12/15/2020 19:27   CT Chest Wo Contrast  Result Date: 12/15/2020 CLINICAL DATA:  Restaging non-small cell lung cancer EXAM: CT CHEST WITHOUT CONTRAST TECHNIQUE:  Multidetector CT imaging of the chest was performed following the standard protocol without IV contrast. COMPARISON:  06/13/2020 FINDINGS: Cardiovascular: Normal heart size. No pericardial effusion identified. Aortic atherosclerosis. Coronary artery calcifications. Mediastinum/Nodes: No enlarged mediastinal or axillary lymph nodes. Thyroid gland, trachea, and esophagus demonstrate no significant findings. Right paratracheal lymph node measures 1.1 cm, image 42/2. Previously this measured the same. Index low right paratracheal lymph node measures 0.5 cm, image 60/2. Previously 0.6 cm. Lungs/Pleura: Moderate right pleural effusion has increased in volume from previous exam. Perihilar and paramediastinal masslike architectural distortion and fibrosis is identified. The appearance is similar to the previous exam. Ground-glass attenuation and interstitial reticulation within the left lower lobe appears unchanged from the previous exam. Sub solid nodule within the left lower lobe is similar measuring 5 mm, image 103/7. Posterior left lower lobe subpleural nodule is unchanged measuring 5 mm. Unchanged 4 mm linear  nodule within the left upper lobe, image 77/7. Upper Abdomen: No acute abnormality. Previous cholecystectomy. Aortic atherosclerosis noted. Musculoskeletal: Stable sclerosis and deformity of the sternum. No acute or suspicious osseous findings. IMPRESSION: 1. Increase in volume of right pleural effusion, now moderate. In the absence of symptoms of CHF consider diagnostic thoracentesis to exclude malignant effusion. 2. Stable appearance of right midlung perihilar and paramediastinal masslike architectural distortion and fibrosis consistent with changes secondary to external beam radiation. 3. Chronic interstitial reticulation within the left lower lobe is stable and is favored to be in etiology. 4. A few nonspecific nodules are noted within the left lung which appears stable from previous exam. 5. Aortic  Atherosclerosis (ICD10-I70.0). Coronary artery calcifications. Electronically Signed   By: Kerby Moors M.D.   On: 12/15/2020 08:45   CT Angio Chest PE W and/or Wo Contrast  Result Date: 12/16/2020 CLINICAL DATA:  Productive cough and headache. EXAM: CT ANGIOGRAPHY CHEST WITH CONTRAST TECHNIQUE: Multidetector CT imaging of the chest was performed using the standard protocol during bolus administration of intravenous contrast. Multiplanar CT image reconstructions and MIPs were obtained to evaluate the vascular anatomy. CONTRAST:  60mL OMNIPAQUE IOHEXOL 350 MG/ML SOLN COMPARISON:  December 12, 2020 FINDINGS: Cardiovascular: Moderate severity calcification of the aortic arch is noted, without evidence of aneurysmal dilatation or dissection. Satisfactory opacification of the pulmonary arteries to the segmental level. No evidence of pulmonary embolism. Normal heart size with moderate severity coronary artery calcification. No pericardial effusion. Mediastinum/Nodes: No enlarged mediastinal, hilar, or axillary lymph nodes. The right paratracheal lymph node seen on the prior study is poorly visualized on the current exam. A stable 4 mm precarinal lymph node is noted (axial CT image 46, CT series number 5). Thyroid gland, trachea, and esophagus demonstrate no significant findings. Lungs/Pleura: A large area of right perihilar and right paramediastinal mass-like architectural distortion is seen. This is unchanged in appearance when compared to the prior study. A stable 5 mm noncalcified subpleural lung nodule is seen within the posterior aspect of the left lower lobe (axial CT image 102, CT series 11). A stable 4 mm linear nodule is seen within the left upper lobe (axial CT image 54, CT series 11). A stable moderate to large right-sided pleural effusion is seen. No pneumothorax is identified. Upper Abdomen: No acute abnormality. Musculoskeletal: A chronic appearing deformity is again seen involving the body of the sternum.  No acute or significant osseous findings. Review of the MIP images confirms the above findings. IMPRESSION: 1. No evidence of pulmonary embolism. 2. Stable moderate to large right-sided pleural effusion. A malignant effusion cannot be excluded. Correlation with diagnostic thoracentesis should be considered. 3. Stable area of mass-like architectural distortion within the perihilar and paramediastinal regions on the right, suggestive of sequelae related tube external beam radiation. 4. Stable subcentimeter left upper lobe and left lower lobe noncalcified lung nodules. Electronically Signed   By: Virgina Norfolk M.D.   On: 12/16/2020 01:38   IR THORACENTESIS ASP PLEURAL SPACE W/IMG GUIDE  Result Date: 12/16/2020 INDICATION: Patient with history of non-small cell lung cancer, dyspnea, and recurrent right pleural effusion. Request is made for diagnostic and therapeutic right thoracentesis. EXAM: ULTRASOUND GUIDED DIAGNOSTIC THERAPEUTIC RIGHT THORACENTESIS MEDICATIONS: 18 mL 1% lidocaine COMPLICATIONS: None immediate. PROCEDURE: An ultrasound guided thoracentesis was thoroughly discussed with the patient and questions answered. The benefits, risks, alternatives and complications were also discussed. The patient understands and wishes to proceed with the procedure. Written consent was obtained. Ultrasound was performed to localize and  mark an adequate pocket of fluid in the right chest. The area was then prepped and draped in the normal sterile fashion. 1% Lidocaine was used for local anesthesia. Under ultrasound guidance a 6 Fr Safe-T-Centesis catheter was introduced. Thoracentesis was performed. The catheter was removed and a dressing applied. FINDINGS: A total of approximately 400 mL of clear gold fluid was removed. Samples were sent to the laboratory as requested by the clinical team. IMPRESSION: Successful ultrasound guided right thoracentesis yielding 400 mL of pleural fluid. Read by: Earley Abide, PA-C  Electronically Signed   By: Jacqulynn Cadet M.D.   On: 12/16/2020 13:20    ASSESSMENT AND PLAN: This is a very pleasant 75 years old African-American female recently diagnosed with a stage IIIB non-small cell lung cancer, squamous cell carcinoma.  She completed a course of concurrent chemoradiation with weekly carboplatin and paclitaxel status post 5 cycles with partial response.  She tolerated this treatment well except for the pancytopenia and fatigue.  The patient completed a course of treatment with consolidation immunotherapy with Imfinzi status post 26 cycles.  She tolerated her treatment well with no concerning adverse effects. She is currently on observation and feeling fine except for the intermittent shortness of breath increased with exertion. She had repeat imaging studies in late April and early May 2022.  I personally and independently reviewed her scans and discussed the results with the patient today. Her imaging studies showed no concerning findings for disease progression. I recommended for her to continue on observation with repeat CT scan of the chest in 6 months. For the hypothyroidism, we will continue to monitor her TSH with the blood work in 6 months. The patient was advised to call immediately if she has any concerning symptoms in the interval.  The patient voices understanding of current disease status and treatment options and is in agreement with the current care plan. All questions were answered. The patient knows to call the clinic with any problems, questions or concerns. We can certainly see the patient much sooner if necessary.  Disclaimer: This note was dictated with voice recognition software. Similar sounding words can inadvertently be transcribed and may not be corrected upon review.

## 2021-01-08 ENCOUNTER — Telehealth: Payer: Self-pay | Admitting: Internal Medicine

## 2021-01-08 NOTE — Telephone Encounter (Signed)
Scheduled per los. Called and left msg. Mailed printout  °

## 2021-01-13 ENCOUNTER — Other Ambulatory Visit: Payer: Self-pay

## 2021-01-13 MED ORDER — DEXLANSOPRAZOLE 30 MG PO CPDR: 1.0000 | DELAYED_RELEASE_CAPSULE | Freq: Every day | ORAL | 3 refills | Status: DC

## 2021-01-13 NOTE — Progress Notes (Signed)
Refill request for dexilant 30 mg once daily. sent

## 2021-01-28 ENCOUNTER — Other Ambulatory Visit: Payer: Self-pay | Admitting: Family Medicine

## 2021-01-28 ENCOUNTER — Other Ambulatory Visit: Payer: Self-pay

## 2021-01-28 ENCOUNTER — Other Ambulatory Visit: Payer: Medicare Other

## 2021-01-28 ENCOUNTER — Ambulatory Visit (INDEPENDENT_AMBULATORY_CARE_PROVIDER_SITE_OTHER)
Admission: RE | Admit: 2021-01-28 | Discharge: 2021-01-28 | Disposition: A | Discharge status: Home / Self Care | Payer: Medicare Other | Source: Ambulatory Visit | Attending: Family Medicine | Admitting: Family Medicine

## 2021-01-28 DIAGNOSIS — J90 Pleural effusion, not elsewhere classified: Secondary | ICD-10-CM

## 2021-01-28 DIAGNOSIS — I517 Cardiomegaly: Secondary | ICD-10-CM | POA: Diagnosis not present

## 2021-01-28 DIAGNOSIS — J189 Pneumonia, unspecified organism: Secondary | ICD-10-CM | POA: Diagnosis not present

## 2021-01-28 DIAGNOSIS — R21 Rash and other nonspecific skin eruption: Secondary | ICD-10-CM | POA: Diagnosis not present

## 2021-01-30 NOTE — Progress Notes (Signed)
Left message on voicemail to call office.  

## 2021-02-02 ENCOUNTER — Telehealth: Payer: Self-pay | Admitting: Family Medicine

## 2021-02-02 NOTE — Telephone Encounter (Signed)
Notified patient of lab results.  Patient verbalized understanding.

## 2021-02-02 NOTE — Telephone Encounter (Signed)
Patient is returning call to the nurse regarding her X-ray. Please give her a call back.

## 2021-02-19 ENCOUNTER — Other Ambulatory Visit: Payer: Self-pay | Admitting: Family Medicine

## 2021-02-19 DIAGNOSIS — Z1231 Encounter for screening mammogram for malignant neoplasm of breast: Secondary | ICD-10-CM

## 2021-02-27 ENCOUNTER — Ambulatory Visit
Admission: RE | Admit: 2021-02-27 | Discharge: 2021-02-27 | Disposition: A | Discharge status: Home / Self Care | Payer: Medicare Other | Source: Ambulatory Visit | Attending: Family Medicine | Admitting: Family Medicine

## 2021-02-27 ENCOUNTER — Other Ambulatory Visit: Payer: Self-pay

## 2021-02-27 DIAGNOSIS — Z780 Asymptomatic menopausal state: Secondary | ICD-10-CM

## 2021-02-27 DIAGNOSIS — M85851 Other specified disorders of bone density and structure, right thigh: Secondary | ICD-10-CM | POA: Diagnosis not present

## 2021-03-13 ENCOUNTER — Other Ambulatory Visit: Payer: Self-pay

## 2021-03-13 ENCOUNTER — Encounter: Payer: Self-pay | Admitting: Family Medicine

## 2021-03-13 ENCOUNTER — Ambulatory Visit (INDEPENDENT_AMBULATORY_CARE_PROVIDER_SITE_OTHER): Payer: Medicare Other | Admitting: Family Medicine

## 2021-03-13 VITALS — BP 140/60 | HR 79 | Temp 97.3°F | Wt 187.8 lb

## 2021-03-13 DIAGNOSIS — M8589 Other specified disorders of bone density and structure, multiple sites: Secondary | ICD-10-CM | POA: Diagnosis not present

## 2021-03-13 DIAGNOSIS — I10 Essential (primary) hypertension: Secondary | ICD-10-CM

## 2021-03-13 DIAGNOSIS — E119 Type 2 diabetes mellitus without complications: Secondary | ICD-10-CM | POA: Diagnosis not present

## 2021-03-13 DIAGNOSIS — E785 Hyperlipidemia, unspecified: Secondary | ICD-10-CM | POA: Diagnosis not present

## 2021-03-13 DIAGNOSIS — E1165 Type 2 diabetes mellitus with hyperglycemia: Secondary | ICD-10-CM | POA: Diagnosis not present

## 2021-03-13 MED ORDER — DAPAGLIFLOZIN PROPANEDIOL 10 MG PO TABS: 10.0000 mg | ORAL_TABLET | Freq: Every day | ORAL | 3 refills | Status: DC

## 2021-03-13 MED ORDER — GLIPIZIDE 10 MG PO TABS: 10.0000 mg | ORAL_TABLET | Freq: Two times a day (BID) | ORAL | 3 refills | Status: DC

## 2021-03-13 MED ORDER — ATORVASTATIN CALCIUM 40 MG PO TABS: 40.0000 mg | ORAL_TABLET | Freq: Every day | ORAL | 3 refills | Status: DC

## 2021-03-13 MED ORDER — LABETALOL HCL 100 MG PO TABS: 50.0000 mg | ORAL_TABLET | Freq: Two times a day (BID) | ORAL | 3 refills | Status: DC

## 2021-03-13 NOTE — Progress Notes (Signed)
Chief Complaint  Patient presents with   Follow-up    6 mo f/u HTN    HPI: Jenna [name] is a 75 y.o. female here for HTN, HLD, DM follow-up. Pt follows with Dr. Loanne Drilling ([place]) for her DM and hypothyroidism. She was last seen in 08/2020 and was to f/u in 1 mo but it appears that appt was cancelled and she does not, as of today, have a f/u appt scheduled. She will call to schedule.  For HTN, pt is taking losartan $RemoveBeforeD'50mg'ZKdyVWTXDfXokp$  daily and labetalol $RemoveBefor'50mg'MXtPBDhKveEH$  BID ($Rem'100mg'vlAo$  tabs 1/2 tab BID). For HLD, pt is taking lipitor $RemoveBefore'40mg'PeVtZtNjuCpHy$  daily. LDL in 08/2020 = 69 and at goal. She does no check BP at home. She does have a BP cuff at home and needs batteries.   Diet: pt states she needs to lose some weight and states she has been "eating too well". Eats out maybe 1x/wk or less. Has been eating a lot of salads but "drenched in ranch dressing"  BP Readings from Last 3 Encounters:  03/13/21 140/60  01/06/21 138/71  12/25/20 140/70   Lab Results  Component Value Date   CREATININE 1.34 (H) 12/17/2020   BUN 31 (H) 12/17/2020   NA 134 (L) 12/17/2020   K 4.0 12/17/2020   CL 105 12/17/2020   CO2 20 (L) 12/17/2020   Lab Results  Component Value Date   HGBA1C 8.8 (H) 12/16/2020   Lab Results  Component Value Date   CHOL 127 09/12/2020   HDL 39.80 09/12/2020   LDLCALC 69 09/12/2020   TRIG 91.0 09/12/2020   CHOLHDL 3 09/12/2020    Past Medical History:  Diagnosis Date   Blood transfusion without reported diagnosis    Cataract    DM (diabetes mellitus) ([place])    GERD (gastroesophageal reflux disease)    Hyperlipidemia    Hypertension    Iron deficiency anemia    NSCL ca dx'd 03/2018   Thyroid disease     Past Surgical History:  Procedure Laterality Date   CATARACT EXTRACTION Right    CHOLECYSTECTOMY     COLONOSCOPY  10/24/2009   normal rectum/1X1cm abnormal lesion in the ascending colon (bx benign). TI normal for 10cm.  Prep difficult/inadequate. f/u TCS 09/2012 recommended   COLONOSCOPY   10/13/2004   Normal rectum/Diminutive polyps, splenic flexure, cold biopsied/removed.  Remainder of colonic mucosa appeared normal.   COLONOSCOPY N/A 12/14/2012   RFF:MBWGYKZ polyp-tubular adenoma   ESOPHAGOGASTRODUODENOSCOPY  10/13/2004    Normal esophagus/ Nodular volcano like lesion in the antrum, either representing a  pancreatic rest or leiomyoma, biopsied.  Remainder of the gastric mucosa appeared normal, normal D1-D2   ESOPHAGOGASTRODUODENOSCOPY  10/24/2009   Benign biopsies. normal esophagus/small hiatal hernia/nodular lesion antrum/distal greater curvature. duodenal AVM s/p ablation   GIVENS CAPSULE STUDY  07/27/2010    multiple arteriovenous malformations which could definitely be the contributor to her drifting hemoglobin and hematocrit   IR THORACENTESIS ASP PLEURAL SPACE W/IMG GUIDE  12/16/2020   TUBAL LIGATION     VIDEO BRONCHOSCOPY WITH ENDOBRONCHIAL ULTRASOUND N/A 04/27/2018   Procedure: VIDEO BRONCHOSCOPY WITH ENDOBRONCHIAL ULTRASOUND;  Surgeon: Melrose Nakayama, MD;  Location: [facility] OR;  Service: Thoracic;  Laterality: N/A;    Social History   Socioeconomic History   Marital status: Single    Spouse name: Not on file   Number of children: 3   Years of education: Not on file   Highest education level: Not on file  Occupational History  Occupation: retired  Tobacco Use   Smoking status: Former    Packs/day: 0.50    Years: 55.00    Pack years: 27.50    Types: Cigarettes    Quit date: 03/18/2018    Years since quitting: 2.9   Smokeless tobacco: Never   Tobacco comments:    smoked off and n  Vaping Use   Vaping Use: Never used  Substance and Sexual Activity   Alcohol use: No   Drug use: No   Sexual activity: Not Currently  Other Topics Concern   Not on file  Social History Narrative   Patient fully vaccinated   Social Determinants of Health   Financial Resource Strain: Low Risk    Difficulty of Paying Living Expenses: Not hard at all  Food Insecurity: No  Food Insecurity   Worried About Charity fundraiser in the Last Year: Never true   [place] in the Last Year: Never true  Transportation Needs: No Transportation Needs   Lack of Transportation (Medical): No   Lack of Transportation (Non-Medical): No  Physical Activity: Inactive   Days of Exercise per Week: 0 days   Minutes of Exercise per Session: 0 min  Stress: No Stress Concern Present   Feeling of Stress : Not at all  Social Connections: Socially Isolated   Frequency of Communication with Friends and Family: More than three times a week   Frequency of Social Gatherings with Friends and Family: More than three times a week   Attends Religious Services: Never   Marine scientist or Organizations: No   Attends Archivist Meetings: Never   Marital Status: Never married  Human resources officer Violence: Not At Risk   Fear of Current or Ex-Partner: No   Emotionally Abused: No   Physically Abused: No   Sexually Abused: No    Family History  Problem Relation Age of Onset   Diabetes Sister    Colon cancer Maternal Aunt        greater than age 88   Breast cancer Cousin    Diabetes Daughter    Diabetes Daughter    Amblyopia Neg Hx    Blindness Neg Hx    Cataracts Neg Hx    Glaucoma Neg Hx    Macular degeneration Neg Hx    Retinal detachment Neg Hx    Strabismus Neg Hx    Retinitis pigmentosa Neg Hx    Rectal cancer Neg Hx    Stomach cancer Neg Hx    Colon polyps Neg Hx    Esophageal cancer Neg Hx      Immunization History  Administered Date(s) Administered   Fluad Quad(high Dose 65+) 05/31/2019, 09/12/2020   Influenza-Unspecified 04/16/2014   PFIZER(Purple Top)SARS-COV-2 Vaccination 11/22/2019, 12/17/2019   Pneumococcal Conjugate-13 06/10/2014    Outpatient Encounter Medications as of 03/13/2021  Medication Sig   atorvastatin (LIPITOR) 40 MG tablet Take 1 tablet (40 mg total) by mouth daily. (Patient taking differently: Take 40 mg by mouth at bedtime.)    dapagliflozin propanediol (FARXIGA) 10 MG TABS tablet Take 1 tablet (10 mg total) by mouth daily before breakfast.   Dexlansoprazole 30 MG capsule Take 1 capsule (30 mg total) by mouth daily.   diphenhydrAMINE (BENADRYL) 2 % cream Apply 1 application topically as needed for itching.   docusate sodium (COLACE) 100 MG capsule Take 100 mg by mouth daily as needed for mild constipation.   Ferrous Sulfate (IRON) 325 (65 Fe) MG TABS Take 1  tablet (325 mg total) by mouth 2 (two) times daily with a meal.   glipiZIDE (GLUCOTROL) 10 MG tablet Take one tablet by mouth two times daily (Patient taking differently: Take 10 mg by mouth 2 (two) times daily before a meal.)   insulin glargine (LANTUS SOLOSTAR) 100 UNIT/ML Solostar Pen Inject 10 Units into the skin every morning. And 31G pen needles 1/day   labetalol (NORMODYNE) 100 MG tablet Take 0.5 tablets (50 mg total) by mouth 2 (two) times daily.   levothyroxine (SYNTHROID) 75 MCG tablet Take 1 tablet (75 mcg total) by mouth daily.   losartan (COZAAR) 50 MG tablet Take 1 tablet (50 mg total) by mouth daily.   Multiple Vitamin (MULTIVITAMIN WITH MINERALS) TABS tablet Take 1 tablet by mouth at bedtime.    blood glucose meter kit and supplies KIT Use up to four times daily as directed (Patient not taking: Reported on 03/13/2021)   Facility-Administered Encounter Medications as of 03/13/2021  Medication   methylPREDNISolone acetate (DEPO-MEDROL) injection 60 mg   promethazine (PHENERGAN) injection 12.5 mg   Tbo-Filgrastim (GRANIX) injection 300 mcg     ROS: Pertinent positives and negatives noted in HPI. Remainder of ROS non-contributory  Gen: no fever, chills  Eyes: no blurry vision, double vision Resp: no cough, wheeze,SOB CV: no CP, palpitations, LE edema,  GI: no heartburn, n/v/d/c, abd pain Neuro: no dizziness, headache, weakness    Allergies  Allergen Reactions   Paclitaxel Other (See Comments)    Unresponsiveness shortly after Taxol inf  started 06/12/18.   Aspirin Other (See Comments)    Stomach bleeding    Esomeprazole Magnesium     UNSPECIFIED REACTION    Ace Inhibitors Other (See Comments)    Dizziness, drunk like    BP 140/60 (BP Location: Left Arm, Patient Position: Sitting, Cuff Size: Normal)   Pulse 79   Temp (!) 97.3 F (36.3 C) (Temporal)   Wt 187 lb 12.8 oz (85.2 kg)   SpO2 99%   BMI 31.25 kg/m   Wt Readings from Last 3 Encounters:  03/13/21 187 lb 12.8 oz (85.2 kg)  01/06/21 180 lb 12.8 oz (82 kg)  12/25/20 180 lb 12.8 oz (82 kg)   Temp Readings from Last 3 Encounters:  03/13/21 (!) 97.3 F (36.3 C) (Temporal)  01/06/21 97.7 F (36.5 C) (Tympanic)  12/25/20 (!) 97.1 F (36.2 C) (Temporal)   BP Readings from Last 3 Encounters:  03/13/21 140/60  01/06/21 138/71  12/25/20 140/70   Pulse Readings from Last 3 Encounters:  03/13/21 79  01/06/21 93  12/25/20 93     Physical Exam Constitutional:      General: She is not in acute distress.    Appearance: Normal appearance. She is not ill-appearing.  Cardiovascular:     Rate and Rhythm: Normal rate and regular rhythm.     Pulses: Normal pulses.  Pulmonary:     Effort: Pulmonary effort is normal. No respiratory distress.     Breath sounds: Normal breath sounds. No wheezing or rhonchi.  Musculoskeletal:     Right lower leg: No edema.     Left lower leg: No edema.  Neurological:     Mental Status: She is alert and oriented to person, place, and time.  Psychiatric:        Mood and Affect: Mood normal.        Behavior: Behavior normal.     A/P:  1. Type 2 diabetes mellitus without complication, without long-term current use of insulin (  [place]) - overdue for f/u with Dr. Loanne Drilling. Pt states she will call to scheduled Refill: - glipiZIDE (GLUCOTROL) 10 MG tablet; Take 1 tablet (10 mg total) by mouth 2 (two) times daily before a meal.  Dispense: 180 tablet; Refill: 3 - dapagliflozin propanediol (FARXIGA) 10 MG TABS tablet; Take 1 tablet (10  mg total) by mouth daily before breakfast.  Dispense: 90 tablet; Refill: 3  2. Hyperlipidemia, unspecified hyperlipidemia type - LDL at goal (69) on FLP in 08/2020 Refill: - atorvastatin (LIPITOR) 40 MG tablet; Take 1 tablet (40 mg total) by mouth at bedtime.  Dispense: 90 tablet; Refill: 3  3. Osteopenia of multiple sites - dexa with T-score = -1.6 - recommend calcium $RemoveBeforeDEI'1200mg'OwcOPuHDzsOcPxgE$  daily and Vit D 1000IU daily - next dexa in 2 years (2024)  4. Essential hypertension - SBP borderline - encouraged pt t improve diet, limit sodium and walk as tolerated - check BP at home and if average > 140 / > 90 call office to make f/u appt Refill: - labetalol (NORMODYNE) 100 MG tablet; Take 0.5 tablets (50 mg total) by mouth 2 (two) times daily.  Dispense: 90 tablet; Refill: 3 - cont losartan $RemoveBefor'50mg'RiNoyWJFIfJS$  daily - f/u in 3 mo  This visit occurred during the SARS-CoV-2 public health emergency.  Safety protocols were in place, including screening questions prior to the visit, additional usage of staff PPE, and extensive cleaning of exam room while observing appropriate contact time as indicated for disinfecting solutions.

## 2021-03-13 NOTE — Patient Instructions (Addendum)
[facility] - Dr. Domingo Mend, Deniece Ree, Dr. Grier Mitts, Dr. Shanon Ace [facility] - Dr. Pricilla Holm  Your have osteopenia. You should take: Take calcium 1200mg  daily Take Vit D 800-1000IU daily Repeat bone density scan in 2 year  Check BP at home. Goal is less than 140/less than 90 If persistently higher than that, call office and schedule follow-up appt

## 2021-04-02 ENCOUNTER — Telehealth: Payer: Self-pay

## 2021-04-02 NOTE — Telephone Encounter (Signed)
Spoke with patient to remind \\her  that he/she is due for repeat labs at this time. No appointment is necessary. Patient is aware that she can stop by the lab in the basement at her convenience between 7:30 AM - 5 PM, Monday through Friday. Patient verbalized understanding and had no concerns at the end of the call.

## 2021-04-02 NOTE — Telephone Encounter (Signed)
-----   Message from Yevette Edwards, RN sent at 12/31/2020 12:38 PM EDT ----- Regarding: Labs CBC, order in epic.

## 2021-04-14 LAB — HM DIABETES EYE EXAM

## 2021-04-15 ENCOUNTER — Encounter: Payer: Self-pay | Admitting: Family Medicine

## 2021-04-16 ENCOUNTER — Other Ambulatory Visit: Payer: Self-pay

## 2021-04-16 ENCOUNTER — Encounter: Payer: Self-pay | Admitting: Internal Medicine

## 2021-04-16 ENCOUNTER — Ambulatory Visit
Admission: RE | Admit: 2021-04-16 | Discharge: 2021-04-16 | Disposition: A | Discharge status: Home / Self Care | Payer: Medicare Other | Source: Ambulatory Visit | Attending: Family Medicine | Admitting: Family Medicine

## 2021-04-16 DIAGNOSIS — Z1231 Encounter for screening mammogram for malignant neoplasm of breast: Secondary | ICD-10-CM

## 2021-04-28 ENCOUNTER — Ambulatory Visit (INDEPENDENT_AMBULATORY_CARE_PROVIDER_SITE_OTHER): Payer: Medicare Other | Admitting: Endocrinology

## 2021-04-28 ENCOUNTER — Other Ambulatory Visit: Payer: Self-pay

## 2021-04-28 VITALS — BP 138/60 | HR 87 | Ht 65.0 in | Wt 186.0 lb

## 2021-04-28 DIAGNOSIS — E119 Type 2 diabetes mellitus without complications: Secondary | ICD-10-CM | POA: Diagnosis not present

## 2021-04-28 LAB — POCT GLYCOSYLATED HEMOGLOBIN (HGB A1C): Hemoglobin A1C: 9.3 % — AB (ref 4.0–5.6)

## 2021-04-28 MED ORDER — LANTUS SOLOSTAR 100 UNIT/ML ~~LOC~~ SOPN: 20.0000 [IU] | PEN_INJECTOR | SUBCUTANEOUS | 99 refills | Status: DC

## 2021-04-28 NOTE — Patient Instructions (Addendum)
Your blood pressure is high today.  Please see your primary care provider soon, to have it rechecked check your blood sugar twice a day.  vary the time of day when you check, between before the 3 meals, and at bedtime.  also check if you have symptoms of your blood sugar being too high or too low.  please keep a record of the readings and bring it to your next appointment here (or you can bring the meter itself).  You can write it on any piece of paper.  please call us sooner if your blood sugar goes below 70, or if you have a lot of readings over 200.   I have sent a prescription to your pharmacy, to increase the Lantus to 20 units each morning.   Please continue the same other medications.   Please come back for a follow-up appointment in 2 months.

## 2021-04-28 NOTE — Progress Notes (Signed)
Subjective:    Patient ID: Jenna [name], female    DOB: 1946/06/24, 75 y.o.   MRN: [id number]  HPI Pt returns for f/u of diabetes mellitus:  DM type: Insulin-requiring type 2.   Dx'ed: 2951 Complications: stage 3 CRI and PAD.   Therapy: insulin since 2022, and 2 oral meds.   GDM: never DKA: never Severe hypoglycemia: never Pancreatitis: never Pancreatic imaging: never SDOH: She declines to check cbg's. Other: she lost weight with XRT, but regained afterwards; she did not tolerate Rybelsus (abd pain).   Interval history: pt reports fatigue.  She takes meds as rx'ed.  We are out of continuous glucose monitors.   Past Medical History:  Diagnosis Date   Blood transfusion without reported diagnosis    Cataract    DM (diabetes mellitus) ([place])    GERD (gastroesophageal reflux disease)    Hyperlipidemia    Hypertension    Iron deficiency anemia    NSCL ca dx'd 03/2018   Thyroid disease     Past Surgical History:  Procedure Laterality Date   CATARACT EXTRACTION Right    CHOLECYSTECTOMY     COLONOSCOPY  10/24/2009   normal rectum/1X1cm abnormal lesion in the ascending colon (bx benign). TI normal for 10cm.  Prep difficult/inadequate. f/u TCS 09/2012 recommended   COLONOSCOPY  10/13/2004   Normal rectum/Diminutive polyps, splenic flexure, cold biopsied/removed.  Remainder of colonic mucosa appeared normal.   COLONOSCOPY N/A 12/14/2012   OAC:ZYSAYTK polyp-tubular adenoma   ESOPHAGOGASTRODUODENOSCOPY  10/13/2004    Normal esophagus/ Nodular volcano like lesion in the antrum, either representing a  pancreatic rest or leiomyoma, biopsied.  Remainder of the gastric mucosa appeared normal, normal D1-D2   ESOPHAGOGASTRODUODENOSCOPY  10/24/2009   Benign biopsies. normal esophagus/small hiatal hernia/nodular lesion antrum/distal greater curvature. duodenal AVM s/p ablation   GIVENS CAPSULE STUDY  07/27/2010    multiple arteriovenous malformations which could definitely be the  contributor to her drifting hemoglobin and hematocrit   IR THORACENTESIS ASP PLEURAL SPACE W/IMG GUIDE  12/16/2020   TUBAL LIGATION     VIDEO BRONCHOSCOPY WITH ENDOBRONCHIAL ULTRASOUND N/A 04/27/2018   Procedure: VIDEO BRONCHOSCOPY WITH ENDOBRONCHIAL ULTRASOUND;  Surgeon: Melrose Nakayama, MD;  Location: [facility] OR;  Service: Thoracic;  Laterality: N/A;    Social History   Socioeconomic History   Marital status: Single    Spouse name: Not on file   Number of children: 3   Years of education: Not on file   Highest education level: Not on file  Occupational History   Occupation: retired  Tobacco Use   Smoking status: Former    Packs/day: 0.50    Years: 55.00    Pack years: 27.50    Types: Cigarettes    Quit date: 03/18/2018    Years since quitting: 3.1   Smokeless tobacco: Never   Tobacco comments:    smoked off and n  Vaping Use   Vaping Use: Never used  Substance and Sexual Activity   Alcohol use: No   Drug use: No   Sexual activity: Not Currently  Other Topics Concern   Not on file  Social History Narrative   Patient fully vaccinated   Social Determinants of Health   Financial Resource Strain: Low Risk    Difficulty of Paying Living Expenses: Not hard at all  Food Insecurity: No Food Insecurity   Worried About Charity fundraiser in the Last Year: Never true   [place] in the Last Year: Never true  Transportation Needs: No Data processing manager (Medical): No   Lack of Transportation (Non-Medical): No  Physical Activity: Inactive   Days of Exercise per Week: 0 days   Minutes of Exercise per Session: 0 min  Stress: No Stress Concern Present   Feeling of Stress : Not at all  Social Connections: Socially Isolated   Frequency of Communication with Friends and Family: More than three times a week   Frequency of Social Gatherings with Friends and Family: More than three times a week   Attends Religious Services: Never   Corporate treasurer or Organizations: No   Attends Music therapist: Never   Marital Status: Never married  Human resources officer Violence: Not At Risk   Fear of Current or Ex-Partner: No   Emotionally Abused: No   Physically Abused: No   Sexually Abused: No    Current Outpatient Medications on File Prior to Visit  Medication Sig Dispense Refill   atorvastatin (LIPITOR) 40 MG tablet Take 1 tablet (40 mg total) by mouth at bedtime. 90 tablet 3   blood glucose meter kit and supplies KIT Use up to four times daily as directed 1 each 0   dapagliflozin propanediol (FARXIGA) 10 MG TABS tablet Take 1 tablet (10 mg total) by mouth daily before breakfast. 90 tablet 3   Dexlansoprazole 30 MG capsule Take 1 capsule (30 mg total) by mouth daily. 30 capsule 3   diphenhydrAMINE (BENADRYL) 2 % cream Apply 1 application topically as needed for itching.     docusate sodium (COLACE) 100 MG capsule Take 100 mg by mouth daily as needed for mild constipation.     Ferrous Sulfate (IRON) 325 (65 Fe) MG TABS Take 1 tablet (325 mg total) by mouth 2 (two) times daily with a meal. 180 tablet 0   glipiZIDE (GLUCOTROL) 10 MG tablet Take 1 tablet (10 mg total) by mouth 2 (two) times daily before a meal. 180 tablet 3   labetalol (NORMODYNE) 100 MG tablet Take 0.5 tablets (50 mg total) by mouth 2 (two) times daily. 90 tablet 3   levothyroxine (SYNTHROID) 75 MCG tablet Take 1 tablet (75 mcg total) by mouth daily. 90 tablet 3   losartan (COZAAR) 50 MG tablet Take 1 tablet (50 mg total) by mouth daily. 90 tablet 3   Multiple Vitamin (MULTIVITAMIN WITH MINERALS) TABS tablet Take 1 tablet by mouth at bedtime.      Current Facility-Administered Medications on File Prior to Visit  Medication Dose Route Frequency Provider Last Rate Last Admin   methylPREDNISolone acetate (DEPO-MEDROL) injection 60 mg  60 mg Intramuscular Once Cirigliano, Mary K, DO       promethazine (PHENERGAN) injection 12.5 mg  12.5 mg Intravenous Once Sandi Mealy  E., PA-C       Tbo-Filgrastim Galen Daft) injection 300 mcg  300 mcg Subcutaneous Once Curt Bears, MD        Allergies  Allergen Reactions   Paclitaxel Other (See Comments)    Unresponsiveness shortly after Taxol inf started 06/12/18.   Aspirin Other (See Comments)    Stomach bleeding    Esomeprazole Magnesium     UNSPECIFIED REACTION    Ace Inhibitors Other (See Comments)    Dizziness, drunk like    Family History  Problem Relation Age of Onset   Diabetes Sister    Colon cancer Maternal Aunt        greater than age 54   Breast cancer Cousin  Diabetes Daughter    Diabetes Daughter    Amblyopia Neg Hx    Blindness Neg Hx    Cataracts Neg Hx    Glaucoma Neg Hx    Macular degeneration Neg Hx    Retinal detachment Neg Hx    Strabismus Neg Hx    Retinitis pigmentosa Neg Hx    Rectal cancer Neg Hx    Stomach cancer Neg Hx    Colon polyps Neg Hx    Esophageal cancer Neg Hx     BP 138/60 (BP Location: Right Arm, Patient Position: Sitting, Cuff Size: Large)   Pulse 87   Ht _0  (1.651 m)   Wt 186 lb (84.4 kg)   SpO2 98%   BMI 30.95 kg/m    Review of Systems     Objective:   Physical Exam Pulses: dorsalis pedis intact bilat.   MSK: no deformity of the feet CV: 1+ bilat leg edema Skin:  no ulcer on the feet.  normal color and temp on the feet.  Neuro: sensation is intact to touch on the feet.    Lab Results  Component Value Date   HGBA1C 9.3 (A) 04/28/2021   Lab Results  Component Value Date   CREATININE 1.34 (H) 12/17/2020   BUN 31 (H) 12/17/2020   NA 134 (L) 12/17/2020   K 4.0 12/17/2020   CL 105 12/17/2020   CO2 20 (L) 12/17/2020   Lab Results  Component Value Date   TSH 0.472 12/17/2020      Assessment & Plan:  Insulin-requiring type 2 DM: uncontrolled.    Patient Instructions  Your blood pressure is high today.  Please see your primary care provider soon, to have it rechecked check your blood sugar twice a day.  vary the time of day  when you check, between before the 3 meals, and at bedtime.  also check if you have symptoms of your blood sugar being too high or too low.  please keep a record of the readings and bring it to your next appointment here (or you can bring the meter itself).  You can write it on any piece of paper.  please call us sooner if your blood sugar goes below 70, or if you have a lot of readings over 200.   I have sent a prescription to your pharmacy, to increase the Lantus to 20 units each morning.   Please continue the same other medications.   Please come back for a follow-up appointment in 2 months.

## 2021-05-25 ENCOUNTER — Other Ambulatory Visit: Payer: Self-pay | Admitting: Gastroenterology

## 2021-06-17 ENCOUNTER — Encounter: Payer: Self-pay | Admitting: Physician Assistant

## 2021-06-17 ENCOUNTER — Other Ambulatory Visit (INDEPENDENT_AMBULATORY_CARE_PROVIDER_SITE_OTHER): Payer: Medicare Other

## 2021-06-17 ENCOUNTER — Ambulatory Visit (INDEPENDENT_AMBULATORY_CARE_PROVIDER_SITE_OTHER): Payer: Medicare Other | Admitting: Physician Assistant

## 2021-06-17 VITALS — BP 138/60 | HR 88 | Ht 65.0 in | Wt 190.4 lb

## 2021-06-17 DIAGNOSIS — K219 Gastro-esophageal reflux disease without esophagitis: Secondary | ICD-10-CM

## 2021-06-17 DIAGNOSIS — K5520 Angiodysplasia of colon without hemorrhage: Secondary | ICD-10-CM

## 2021-06-17 DIAGNOSIS — D509 Iron deficiency anemia, unspecified: Secondary | ICD-10-CM

## 2021-06-17 LAB — CBC
HCT: 34.1 % — ABNORMAL LOW (ref 36.0–46.0)
Hemoglobin: 10.5 g/dL — ABNORMAL LOW (ref 12.0–15.0)
MCHC: 30.9 g/dL (ref 30.0–36.0)
MCV: 85.4 fl (ref 78.0–100.0)
Platelets: 191 10*3/uL (ref 150.0–400.0)
RBC: 4 Mil/uL (ref 3.87–5.11)
RDW: 16.5 % — ABNORMAL HIGH (ref 11.5–15.5)
WBC: 4.9 10*3/uL (ref 4.0–10.5)

## 2021-06-17 LAB — IBC + FERRITIN
Ferritin: 37.3 ng/mL (ref 10.0–291.0)
Iron: 54 ug/dL (ref 42–145)
Saturation Ratios: 16.8 % — ABNORMAL LOW (ref 20.0–50.0)
TIBC: 320.6 ug/dL (ref 250.0–450.0)
Transferrin: 229 mg/dL (ref 212.0–360.0)

## 2021-06-17 MED ORDER — DEXILANT 30 MG PO CPDR: 1.0000 | DELAYED_RELEASE_CAPSULE | Freq: Every day | ORAL | 3 refills | Status: DC

## 2021-06-17 NOTE — Progress Notes (Signed)
A 

## 2021-06-17 NOTE — Patient Instructions (Addendum)
We have sent the following medications to your pharmacy for you to pick up at your convenience: [place] provider has requested that you go to the basement level for lab work before leaving today. Press "B" on the elevator. The lab is located at the first door on the left as you exit the elevator.  Due to recent changes in healthcare laws, you may see the results of your imaging and laboratory studies on MyChart before your provider has had a chance to review them.  We understand that in some cases there may be results that are confusing or concerning to you. Not all laboratory results come back in the same time frame and the provider may be waiting for multiple results in order to interpret others.  Please give [place] 48 hours in order for your provider to thoroughly review all the results before contacting the office for clarification of your results.   Thank you for choosing me and [facility].  Ellouise Newer PA

## 2021-06-17 NOTE — Progress Notes (Signed)
Agree with assessment and plan as outlined.  

## 2021-06-17 NOTE — Progress Notes (Signed)
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 Havery Moros, who was referred to me by Ronnald Nian, DO for follow-up.    05/28/2020 patient followed with Dr. Havery Moros.  It was noted she had an EGD and colonoscopy in May 2020 with diminutive AVMs in the small bowel and a large cecal AVM with a few adenomatous that removed.  At that time was taking oral iron and her hemoglobin ranged from 10-11.  Also on Dexilant 60 mg a day which worked well for heartburn and reflux.  At that time continued on iron supplementation.  Noted that she is due for surveillance colonoscopy between 2023 and 2025.  She was transitioned down to 30 mg of Dexilant daily.    04/02/2021 patient told she was due for CBC.  Does not appear she had this done.  (Last CBC 12/17/2020 with a hemoglobin 9.8).    Today, the patient tells me that she is doing quite well.  She is currently taking the generic Dexilant 30 mg once daily and has no trouble with reflux.  She would like a 90-day refill of this.  Tells me she was recently started on a statin by her primary care provider but has had an increase in joint pains and aches and tells me it is "harder to get around than it used to be".  She wants to come off of this medicine but does not have a follow-up with her new primary care provider until January.  She does see Dr. Earlie Server at the cancer center in November.  Otherwise no new GI complaints or concerns.  No blood in her stool.    Denies fever, chills, weight loss, change in bowel habits or abdominal pain  Prior workup: Colonoscopy 12/14/2012 - Dr. Gala Romney - 66mm splenic flexure polyp, otherwise normal exam   EGD and colonoscopy 10/2009 - paper report, not interpretable - report of a  duodenal AVM that was ablated   EGD 12/19/18 - - The exam of the esophagus was otherwise normal. - A single 6 mm papule (nodule) was found in the gastric antrum, appeared to be umbilicated and subepithelial, suspicious for benign pancreatic rest. Biopsies were taken with a cold forceps for histology. - Patchy mildly erythematous mucosa was found in the gastric body and in the gastric antrum. Biopsies were taken with a cold forceps for Helicobacter pylori testing. - The exam of the stomach was otherwise normal. - A few small angiodysplastic lesions were found in the duodenal bulb and in the second portion of the duodenum (3 total - 1 bulb, 2 in 2nd portion - all small). - The exam of the duodenum was otherwise normal.     Colonoscopy 12/19/18 - The perianal and digital rectal examinations were normal. - The terminal ileum appeared normal. - A single large angiodysplastic lesion was found in the cecum. - Three sessile polyps were found in the transverse colon. The polyps were 4 to 5 mm in size. These polyps were removed with a cold snare. Resection and retrieval were complete. - A few small-mouthed diverticula were found in the sigmoid colon. - The exam was otherwise without abnormality.   1. Surgical [P], stomach, antral - BENIGN POLYPOID GASTRIC TYPE MUCOSA WITH MILD CHRONIC INFLAMMATION. - THERE IS NO EVIDENCE OF DYSPLASIA  OR MALIGNANCY. 2. Surgical [P], gastric antrum and gastric body - CHRONIC INACTIVE GASTRITIS, MILD. - THERE IS NO EVIDENCE OF HELICOBACTER PYLORI, DYSPLASIA, OR MALIGNANCY. - SEE COMMENT. 3. Surgical [P], colon, transverse, polyp (3) - TUBULAR ADENOMA(S). - HIGH GRADE DYSPLASIA IS NOT IDENTIFIED.     CT scan chest 02/12/20 - IMPRESSION: 1. Continued interval development of post treatment consolidation and fibrosis of the perihilar right lung, with essentially total fibrosis and volume loss of the right middle lobe. 2. Slight interval decrease in size of a right  paraesophageal lymph node. Unchanged prominent pretracheal lymph nodes. 3. Overall findings above are consistent with continued treatment response. 4. Unchanged small right pleural effusion. 5. Emphysema (ICD10-J43.9). 6. Coronary artery disease.  Aortic Atherosclerosis (ICD10-I70.0).      Past Medical History:  Diagnosis Date   Blood transfusion without reported diagnosis    Cataract    DM (diabetes mellitus) ([place])    GERD (gastroesophageal reflux disease)    Hyperlipidemia    Hypertension    Iron deficiency anemia    NSCL ca dx'd 03/2018   Thyroid disease     Past Surgical History:  Procedure Laterality Date   CATARACT EXTRACTION Right    CHOLECYSTECTOMY     COLONOSCOPY  10/24/2009   normal rectum/1X1cm abnormal lesion in the ascending colon (bx benign). TI normal for 10cm.  Prep difficult/inadequate. f/u TCS 09/2012 recommended   COLONOSCOPY  10/13/2004   Normal rectum/Diminutive polyps, splenic flexure, cold biopsied/removed.  Remainder of colonic mucosa appeared normal.   COLONOSCOPY N/A 12/14/2012   [facility]:[id number] polyp-tubular adenoma   ESOPHAGOGASTRODUODENOSCOPY  10/13/2004    Normal esophagus/ Nodular volcano like lesion in the antrum, either representing a  pancreatic rest or leiomyoma, biopsied.  Remainder of the gastric mucosa appeared normal, normal D1-D2   ESOPHAGOGASTRODUODENOSCOPY  10/24/2009   Benign biopsies. normal esophagus/small hiatal hernia/nodular lesion antrum/distal greater curvature. duodenal AVM s/p ablation   GIVENS CAPSULE STUDY  07/27/2010    multiple arteriovenous malformations which could definitely be the contributor to her drifting hemoglobin and hematocrit   IR THORACENTESIS ASP PLEURAL SPACE W/IMG GUIDE  12/16/2020   TUBAL LIGATION     VIDEO BRONCHOSCOPY WITH ENDOBRONCHIAL ULTRASOUND N/A 04/27/2018   Procedure: VIDEO BRONCHOSCOPY WITH ENDOBRONCHIAL ULTRASOUND;  Surgeon: Melrose Nakayama, MD;  Location: [facility] OR;  Service: Thoracic;  Laterality:  N/A;    Current Outpatient Medications  Medication Sig Dispense Refill   atorvastatin (LIPITOR) 40 MG tablet Take 1 tablet (40 mg total) by mouth at bedtime. 90 tablet 3   blood glucose meter kit and supplies KIT Use up to four times daily as directed 1 each 0   dapagliflozin propanediol (FARXIGA) 10 MG TABS tablet Take 1 tablet (10 mg total) by mouth daily before breakfast. 90 tablet 3   DEXILANT 30 MG capsule Take 1 capsule (30 mg total) by mouth daily. Please schedule a yearly follow up with Dr. Havery Moros for further refills. Thank you: [phone] 30 capsule 1   diphenhydrAMINE (BENADRYL) 2 % cream Apply 1 application topically as needed for itching.     docusate sodium (COLACE) 100 MG capsule Take 100 mg by mouth daily as needed for mild constipation.     Ferrous Sulfate (IRON) 325 (65 Fe) MG TABS Take 1 tablet (325 mg total) by mouth 2 (two) times daily with a meal. 180 tablet 0   glipiZIDE (GLUCOTROL) 10 MG tablet Take 1 tablet (10 mg total) by mouth 2 (two) times daily before a meal. 180 tablet  3   insulin glargine (LANTUS SOLOSTAR) 100 UNIT/ML Solostar Pen Inject 20 Units into the skin every morning. And 31G pen needles 1/day 30 mL PRN   labetalol (NORMODYNE) 100 MG tablet Take 0.5 tablets (50 mg total) by mouth 2 (two) times daily. 90 tablet 3   levothyroxine (SYNTHROID) 75 MCG tablet Take 1 tablet (75 mcg total) by mouth daily. 90 tablet 3   losartan (COZAAR) 50 MG tablet Take 1 tablet (50 mg total) by mouth daily. 90 tablet 3   Multiple Vitamin (MULTIVITAMIN WITH MINERALS) TABS tablet Take 1 tablet by mouth at bedtime.      Current Facility-Administered Medications  Medication Dose Route Frequency Provider Last Rate Last Admin   methylPREDNISolone acetate (DEPO-MEDROL) injection 60 mg  60 mg Intramuscular Once Cirigliano, Mary K, DO       Facility-Administered Medications Ordered in Other Visits  Medication Dose Route Frequency Provider Last Rate Last Admin   promethazine  (PHENERGAN) injection 12.5 mg  12.5 mg Intravenous Once Harle Stanford., PA-C       Tbo-Filgrastim Galen Daft) injection 300 mcg  300 mcg Subcutaneous Once Curt Bears, MD        Allergies as of 06/17/2021 - Review Complete 06/17/2021  Allergen Reaction Noted   Paclitaxel Other (See Comments) 06/15/2018   Aspirin Other (See Comments)    Esomeprazole magnesium     Ace inhibitors Other (See Comments) 03/23/2018    Family History  Problem Relation Age of Onset   Diabetes Sister    Colon cancer Maternal Aunt        greater than age 37   Breast cancer Cousin    Diabetes Daughter    Diabetes Daughter    Amblyopia Neg Hx    Blindness Neg Hx    Cataracts Neg Hx    Glaucoma Neg Hx    Macular degeneration Neg Hx    Retinal detachment Neg Hx    Strabismus Neg Hx    Retinitis pigmentosa Neg Hx    Rectal cancer Neg Hx    Stomach cancer Neg Hx    Colon polyps Neg Hx    Esophageal cancer Neg Hx     Social History   Socioeconomic History   Marital status: Single    Spouse name: Not on file   Number of children: 3   Years of education: Not on file   Highest education level: Not on file  Occupational History   Occupation: retired  Tobacco Use   Smoking status: Former    Packs/day: 0.50    Years: 55.00    Pack years: 27.50    Types: Cigarettes    Quit date: 03/18/2018    Years since quitting: 3.2   Smokeless tobacco: Never   Tobacco comments:    smoked off and n  Vaping Use   Vaping Use: Never used  Substance and Sexual Activity   Alcohol use: No   Drug use: No   Sexual activity: Not Currently  Other Topics Concern   Not on file  Social History Narrative   Patient fully vaccinated   Social Determinants of Health   Financial Resource Strain: Low Risk    Difficulty of Paying Living Expenses: Not hard at all  Food Insecurity: No Food Insecurity   Worried About Charity fundraiser in the Last Year: Never true   Ran Out of Food in the Last Year: Never true   Transportation Needs: No Transportation Needs   Lack of Transportation (Medical): No  Lack of Transportation (Non-Medical): No  Physical Activity: Inactive   Days of Exercise per Week: 0 days   Minutes of Exercise per Session: 0 min  Stress: No Stress Concern Present   Feeling of Stress : Not at all  Social Connections: Socially Isolated   Frequency of Communication with Friends and Family: More than three times a week   Frequency of Social Gatherings with Friends and Family: More than three times a week   Attends Religious Services: Never   Database administrator or Organizations: No   Attends Engineer, structural: Never   Marital Status: Never married  Catering manager Violence: Not At Risk   Fear of Current or Ex-Partner: No   Emotionally Abused: No   Physically Abused: No   Sexually Abused: No    Review of Systems:    Constitutional: No weight loss, fever or chills Cardiovascular: No chest pain Respiratory: No SOB  Gastrointestinal: See HPI and otherwise negative   Physical Exam:  Vital signs: BP 138/60   Pulse 88   Ht 5\' 5"  (1.651 m)   Wt 190 lb 6 oz (86.4 kg)   BMI 31.68 kg/m    Constitutional:   Pleasant overweight AA female appears to be in NAD, Well developed, Well nourished, alert and cooperative Respiratory: Respirations even and unlabored. Lungs clear to auscultation bilaterally.   No wheezes, crackles, or rhonchi.  Cardiovascular: Normal S1, S2. No MRG. Regular rate and rhythm. No peripheral edema, cyanosis or pallor.  Gastrointestinal:  Soft, nondistended, nontender. No rebound or guarding. Normal bowel sounds. No appreciable masses or hepatomegaly. Rectal:  Not performed.  Psychiatric: Demonstrates good judgement and reason without abnormal affect or behaviors.  RELEVANT LABS AND IMAGING: CBC    Component Value Date/Time   WBC 3.8 (L) 12/17/2020 1036   RBC 3.77 (L) 12/17/2020 1036   HGB 9.8 (L) 12/17/2020 1036   HGB 10.1 (L) 12/12/2020  1213   HGB 8.1 (L) 12/27/2017 1123   HCT 32.3 (L) 12/17/2020 1036   HCT 24.4 (L) 12/27/2017 1123   PLT 186 12/17/2020 1036   PLT 206 12/12/2020 1213   PLT 296 12/27/2017 1123   MCV 85.7 12/17/2020 1036   MCV 70 (L) 12/27/2017 1123   MCH 26.0 12/17/2020 1036   MCHC 30.3 12/17/2020 1036   RDW 16.2 (H) 12/17/2020 1036   RDW 17.0 (H) 12/27/2017 1123   LYMPHSABS 0.2 (L) 12/17/2020 1036   LYMPHSABS 0.8 12/27/2017 1123   MONOABS 0.4 12/17/2020 1036   EOSABS 0.0 12/17/2020 1036   EOSABS 0.1 12/27/2017 1123   BASOSABS 0.0 12/17/2020 1036   BASOSABS 0.0 12/27/2017 1123    CMP     Component Value Date/Time   NA 134 (L) 12/17/2020 1036   K 4.0 12/17/2020 1036   CL 105 12/17/2020 1036   CO2 20 (L) 12/17/2020 1036   GLUCOSE 260 (H) 12/17/2020 1036   BUN 31 (H) 12/17/2020 1036   BUN 24 (A) 02/12/2020 0000   CREATININE 1.34 (H) 12/17/2020 1036   CREATININE 1.38 (H) 12/12/2020 1213   CREATININE 0.72 12/10/2011 0919   CALCIUM 8.8 (L) 12/17/2020 1036   PROT 7.0 12/16/2020 0747   ALBUMIN 3.3 (L) 12/16/2020 0747   ALBUMIN 4.3 12/10/2011 0919   AST 26 12/16/2020 0747   AST 19 12/12/2020 1213   ALT 21 12/16/2020 0747   ALT 21 12/12/2020 1213   ALKPHOS 80 12/16/2020 0747   ALKPHOS 88 12/10/2011 0919   BILITOT 0.5 12/16/2020 0747  BILITOT 0.3 12/12/2020 1213   GFRNONAA 42 (L) 12/17/2020 1036   GFRNONAA 40 (L) 12/12/2020 1213   GFRAA 43 (L) 02/12/2020 0941    Assessment: 1.  Iron deficiency anemia: Chronically on iron supplementation, history of small bowel and colonic AVMs 2.  GERD: Controlled on Dexlansoprazole 30 mg daily  Plan: 1.  Recheck CBC and iron studies today. 2.  Continue Lansoprazole 30 mg daily.  Sent in a 90-day prescription, #90 with 3 refills. 3.  Explained to patient that she could discuss her statin use with Dr. Earlie Server he may have other suggestions for her, especially since she would rather not wait until the end of January having all of these joint pains. 4.   Patient to follow in clinic per recommendations after labs above.  Ellouise Newer, PA-C [facility] 06/17/2021, 10:55 AM  Cc: Ronnald Nian, DO

## 2021-07-01 ENCOUNTER — Other Ambulatory Visit: Payer: Self-pay

## 2021-07-01 ENCOUNTER — Telehealth: Payer: Self-pay | Admitting: Nutrition

## 2021-07-01 ENCOUNTER — Ambulatory Visit (INDEPENDENT_AMBULATORY_CARE_PROVIDER_SITE_OTHER): Payer: Medicare Other | Admitting: Endocrinology

## 2021-07-01 VITALS — BP 148/50 | HR 101 | Ht 65.0 in | Wt 190.6 lb

## 2021-07-01 DIAGNOSIS — E119 Type 2 diabetes mellitus without complications: Secondary | ICD-10-CM | POA: Diagnosis not present

## 2021-07-01 LAB — POCT GLYCOSYLATED HEMOGLOBIN (HGB A1C): Hemoglobin A1C: 8.2 % — AB (ref 4.0–5.6)

## 2021-07-01 MED ORDER — GLIPIZIDE 10 MG PO TABS: 10.0000 mg | ORAL_TABLET | Freq: Every day | ORAL | 3 refills | Status: DC

## 2021-07-01 MED ORDER — LANTUS SOLOSTAR 100 UNIT/ML ~~LOC~~ SOPN: 30.0000 [IU] | PEN_INJECTOR | SUBCUTANEOUS | 99 refills | Status: DC

## 2021-07-01 NOTE — Patient Instructions (Addendum)
Your blood pressure is high today.  Please see your primary care provider soon, to have it rechecked.   check your blood sugar twice a day.  vary the time of day when you check, between before the 3 meals, and at bedtime.  also check if you have symptoms of your blood sugar being too high or too low.  please keep a record of the readings and bring it to your next appointment here (or you can bring the meter itself).  You can write it on any piece of paper.  please call us sooner if your blood sugar goes below 70, or if you have a lot of readings over 200.   I have sent a prescription to your pharmacy, to increase the Lantus to 30 units each morning, and:   Reduce the glipizide to 10 mg in the morning, and none in the evening.   Please continue the same [place].   We are putting on a continuous glucose monitor sensor today.   Please come back for a follow-up appointment in 2 weeks.

## 2021-07-01 NOTE — Progress Notes (Signed)
Subjective:    Patient ID: Jenna [name], female    DOB: 04/15/1946, 75 y.o.   MRN: [id number]  HPI Pt returns for f/u of diabetes mellitus:  DM type: Insulin-requiring type 2.   Dx'ed: 2119 Complications: stage 3 CRI and PAD.   Therapy: insulin since 2022, and 2 oral meds.   GDM: never DKA: never Severe hypoglycemia: never Pancreatitis: never Pancreatic imaging: never SDOH: She declines to check cbg's. Other: she lost weight with XRT, but regained afterwards; she did not tolerate Rybelsus (abd pain).   Interval history: pt states she feels well in general.  She takes meds as rx'ed.   Past Medical History:  Diagnosis Date   Blood transfusion without reported diagnosis    Cataract    DM (diabetes mellitus) ([place])    GERD (gastroesophageal reflux disease)    Hyperlipidemia    Hypertension    Iron deficiency anemia    NSCL ca dx'd 03/2018   Thyroid disease     Past Surgical History:  Procedure Laterality Date   CATARACT EXTRACTION Right    CHOLECYSTECTOMY     COLONOSCOPY  10/24/2009   normal rectum/1X1cm abnormal lesion in the ascending colon (bx benign). TI normal for 10cm.  Prep difficult/inadequate. f/u TCS 09/2012 recommended   COLONOSCOPY  10/13/2004   Normal rectum/Diminutive polyps, splenic flexure, cold biopsied/removed.  Remainder of colonic mucosa appeared normal.   COLONOSCOPY N/A 12/14/2012   ERD:EYCXKGY polyp-tubular adenoma   ESOPHAGOGASTRODUODENOSCOPY  10/13/2004    Normal esophagus/ Nodular volcano like lesion in the antrum, either representing a  pancreatic rest or leiomyoma, biopsied.  Remainder of the gastric mucosa appeared normal, normal D1-D2   ESOPHAGOGASTRODUODENOSCOPY  10/24/2009   Benign biopsies. normal esophagus/small hiatal hernia/nodular lesion antrum/distal greater curvature. duodenal AVM s/p ablation   GIVENS CAPSULE STUDY  07/27/2010    multiple arteriovenous malformations which could definitely be the contributor to her drifting  hemoglobin and hematocrit   IR THORACENTESIS ASP PLEURAL SPACE W/IMG GUIDE  12/16/2020   TUBAL LIGATION     VIDEO BRONCHOSCOPY WITH ENDOBRONCHIAL ULTRASOUND N/A 04/27/2018   Procedure: VIDEO BRONCHOSCOPY WITH ENDOBRONCHIAL ULTRASOUND;  Surgeon: Melrose Nakayama, MD;  Location: [facility] OR;  Service: Thoracic;  Laterality: N/A;    Social History   Socioeconomic History   Marital status: Single    Spouse name: Not on file   Number of children: 3   Years of education: Not on file   Highest education level: Not on file  Occupational History   Occupation: retired  Tobacco Use   Smoking status: Former    Packs/day: 0.50    Years: 55.00    Pack years: 27.50    Types: Cigarettes    Quit date: 03/18/2018    Years since quitting: 3.3   Smokeless tobacco: Never   Tobacco comments:    smoked off and n  Vaping Use   Vaping Use: Never used  Substance and Sexual Activity   Alcohol use: No   Drug use: No   Sexual activity: Not Currently  Other Topics Concern   Not on file  Social History Narrative   Patient fully vaccinated   Social Determinants of Health   Financial Resource Strain: Low Risk    Difficulty of Paying Living Expenses: Not hard at all  Food Insecurity: No Food Insecurity   Worried About Charity fundraiser in the Last Year: Never true   [place] in the Last Year: Never true  Transportation Needs: No  Transportation Needs   Lack of Transportation (Medical): No   Lack of Transportation (Non-Medical): No  Physical Activity: Inactive   Days of Exercise per Week: 0 days   Minutes of Exercise per Session: 0 min  Stress: No Stress Concern Present   Feeling of Stress : Not at all  Social Connections: Socially Isolated   Frequency of Communication with Friends and Family: More than three times a week   Frequency of Social Gatherings with Friends and Family: More than three times a week   Attends Religious Services: Never   Marine scientist or Organizations: No    Attends Music therapist: Never   Marital Status: Never married  Human resources officer Violence: Not At Risk   Fear of Current or Ex-Partner: No   Emotionally Abused: No   Physically Abused: No   Sexually Abused: No    Current Outpatient Medications on File Prior to Visit  Medication Sig Dispense Refill   atorvastatin (LIPITOR) 40 MG tablet Take 1 tablet (40 mg total) by mouth at bedtime. 90 tablet 3   blood glucose meter kit and supplies KIT Use up to four times daily as directed 1 each 0   dapagliflozin propanediol (FARXIGA) 10 MG TABS tablet Take 1 tablet (10 mg total) by mouth daily before breakfast. 90 tablet 3   DEXILANT 30 MG capsule Take 1 capsule (30 mg total) by mouth daily. 90 capsule 3   diphenhydrAMINE (BENADRYL) 2 % cream Apply 1 application topically as needed for itching.     docusate sodium (COLACE) 100 MG capsule Take 100 mg by mouth daily as needed for mild constipation.     Ferrous Sulfate (IRON) 325 (65 Fe) MG TABS Take 1 tablet (325 mg total) by mouth 2 (two) times daily with a meal. 180 tablet 0   labetalol (NORMODYNE) 100 MG tablet Take 0.5 tablets (50 mg total) by mouth 2 (two) times daily. 90 tablet 3   levothyroxine (SYNTHROID) 75 MCG tablet Take 1 tablet (75 mcg total) by mouth daily. 90 tablet 3   losartan (COZAAR) 50 MG tablet Take 1 tablet (50 mg total) by mouth daily. 90 tablet 3   Multiple Vitamin (MULTIVITAMIN WITH MINERALS) TABS tablet Take 1 tablet by mouth at bedtime.      Current Facility-Administered Medications on File Prior to Visit  Medication Dose Route Frequency Provider Last Rate Last Admin   methylPREDNISolone acetate (DEPO-MEDROL) injection 60 mg  60 mg Intramuscular Once Cirigliano, Mary K, DO       promethazine (PHENERGAN) injection 12.5 mg  12.5 mg Intravenous Once Sandi Mealy E., PA-C       Tbo-Filgrastim Galen Daft) injection 300 mcg  300 mcg Subcutaneous Once Curt Bears, MD        Allergies  Allergen Reactions    Paclitaxel Other (See Comments)    Unresponsiveness shortly after Taxol inf started 06/12/18.   Aspirin Other (See Comments)    Stomach bleeding    Esomeprazole Magnesium     UNSPECIFIED REACTION    Ace Inhibitors Other (See Comments)    Dizziness, drunk like    Family History  Problem Relation Age of Onset   Diabetes Sister    Colon cancer Maternal Aunt        greater than age 17   Breast cancer Cousin    Diabetes Daughter    Diabetes Daughter    Amblyopia Neg Hx    Blindness Neg Hx    Cataracts Neg Hx  Glaucoma Neg Hx    Macular degeneration Neg Hx    Retinal detachment Neg Hx    Strabismus Neg Hx    Retinitis pigmentosa Neg Hx    Rectal cancer Neg Hx    Stomach cancer Neg Hx    Colon polyps Neg Hx    Esophageal cancer Neg Hx     BP (!) 148/50 (BP Location: Right Arm, Patient Position: Sitting, Cuff Size: Large)   Pulse (!) 101   Ht _0  (1.651 m)   Wt 190 lb 9.6 oz (86.5 kg)   SpO2 94%   BMI 31.72 kg/m    Review of Systems She dermatitis    Objective:   Physical Exam    Lab Results  Component Value Date   HGBA1C 8.2 (A) 07/01/2021       Assessment & Plan:  Insulin-requiring type 2 DM: uncontrolled.  Patient Instructions  Your blood pressure is high today.  Please see your primary care provider soon, to have it rechecked.   check your blood sugar twice a day.  vary the time of day when you check, between before the 3 meals, and at bedtime.  also check if you have symptoms of your blood sugar being too high or too low.  please keep a record of the readings and bring it to your next appointment here (or you can bring the meter itself).  You can write it on any piece of paper.  please call us sooner if your blood sugar goes below 70, or if you have a lot of readings over 200.   I have sent a prescription to your pharmacy, to increase the Lantus to 30 units each morning, and:   Reduce the glipizide to 10 mg in the morning, and none in the evening.    Please continue the same [place].   We are putting on a continuous glucose monitor sensor today.   Please come back for a follow-up appointment in 2 weeks.

## 2021-07-01 NOTE — Telephone Encounter (Signed)
Freestyle Libre pro inserted into patient's left arm.  Lot number: [id number]  Exp: 4/23.  The sensor was started and rechecked in 2 minutes and was working.  She was told to return in 2 weeks for recheck.  She agreed to do this and had no final questions.

## 2021-07-06 ENCOUNTER — Encounter (HOSPITAL_COMMUNITY): Payer: Self-pay

## 2021-07-06 ENCOUNTER — Ambulatory Visit (HOSPITAL_COMMUNITY)
Admission: RE | Admit: 2021-07-06 | Discharge: 2021-07-06 | Disposition: A | Discharge status: Home / Self Care | Payer: Medicare Other | Source: Ambulatory Visit | Attending: Internal Medicine | Admitting: Internal Medicine

## 2021-07-06 ENCOUNTER — Other Ambulatory Visit: Payer: Self-pay

## 2021-07-06 ENCOUNTER — Inpatient Hospital Stay: Payer: Medicare Other | Attending: Internal Medicine

## 2021-07-06 ENCOUNTER — Other Ambulatory Visit: Payer: Self-pay | Admitting: Internal Medicine

## 2021-07-06 DIAGNOSIS — Z923 Personal history of irradiation: Secondary | ICD-10-CM | POA: Insufficient documentation

## 2021-07-06 DIAGNOSIS — C3490 Malignant neoplasm of unspecified part of unspecified bronchus or lung: Secondary | ICD-10-CM

## 2021-07-06 DIAGNOSIS — Z79899 Other long term (current) drug therapy: Secondary | ICD-10-CM | POA: Insufficient documentation

## 2021-07-06 DIAGNOSIS — I10 Essential (primary) hypertension: Secondary | ICD-10-CM | POA: Insufficient documentation

## 2021-07-06 DIAGNOSIS — E032 Hypothyroidism due to medicaments and other exogenous substances: Secondary | ICD-10-CM | POA: Diagnosis not present

## 2021-07-06 DIAGNOSIS — Z794 Long term (current) use of insulin: Secondary | ICD-10-CM | POA: Insufficient documentation

## 2021-07-06 DIAGNOSIS — T451X5D Adverse effect of antineoplastic and immunosuppressive drugs, subsequent encounter: Secondary | ICD-10-CM | POA: Insufficient documentation

## 2021-07-06 DIAGNOSIS — Z9221 Personal history of antineoplastic chemotherapy: Secondary | ICD-10-CM | POA: Insufficient documentation

## 2021-07-06 DIAGNOSIS — E079 Disorder of thyroid, unspecified: Secondary | ICD-10-CM | POA: Insufficient documentation

## 2021-07-06 DIAGNOSIS — E119 Type 2 diabetes mellitus without complications: Secondary | ICD-10-CM | POA: Insufficient documentation

## 2021-07-06 DIAGNOSIS — Z85118 Personal history of other malignant neoplasm of bronchus and lung: Secondary | ICD-10-CM | POA: Insufficient documentation

## 2021-07-06 LAB — CBC WITH DIFFERENTIAL (CANCER CENTER ONLY)
Abs Immature Granulocytes: 0.01 10*3/uL (ref 0.00–0.07)
Basophils Absolute: 0 10*3/uL (ref 0.0–0.1)
Basophils Relative: 1 %
Eosinophils Absolute: 0.1 10*3/uL (ref 0.0–0.5)
Eosinophils Relative: 1 %
HCT: 36.9 % (ref 36.0–46.0)
Hemoglobin: 11.1 g/dL — ABNORMAL LOW (ref 12.0–15.0)
Immature Granulocytes: 0 %
Lymphocytes Relative: 13 %
Lymphs Abs: 0.6 10*3/uL — ABNORMAL LOW (ref 0.7–4.0)
MCH: 26.7 pg (ref 26.0–34.0)
MCHC: 30.1 g/dL (ref 30.0–36.0)
MCV: 88.9 fL (ref 80.0–100.0)
Monocytes Absolute: 0.3 10*3/uL (ref 0.1–1.0)
Monocytes Relative: 6 %
Neutro Abs: 3.3 10*3/uL (ref 1.7–7.7)
Neutrophils Relative %: 79 %
Platelet Count: 200 10*3/uL (ref 150–400)
RBC: 4.15 MIL/uL (ref 3.87–5.11)
RDW: 15.7 % — ABNORMAL HIGH (ref 11.5–15.5)
WBC Count: 4.2 10*3/uL (ref 4.0–10.5)
nRBC: 0 % (ref 0.0–0.2)

## 2021-07-06 LAB — CMP (CANCER CENTER ONLY)
ALT: 17 U/L (ref 0–44)
AST: 21 U/L (ref 15–41)
Albumin: 3.9 g/dL (ref 3.5–5.0)
Alkaline Phosphatase: 92 U/L (ref 38–126)
Anion gap: 9 (ref 5–15)
BUN: 28 mg/dL — ABNORMAL HIGH (ref 8–23)
CO2: 21 mmol/L — ABNORMAL LOW (ref 22–32)
Calcium: 9.4 mg/dL (ref 8.9–10.3)
Chloride: 108 mmol/L (ref 98–111)
Creatinine: 1.29 mg/dL — ABNORMAL HIGH (ref 0.44–1.00)
GFR, Estimated: 43 mL/min — ABNORMAL LOW (ref >60)
Glucose, Bld: 81 mg/dL (ref 70–99)
Potassium: 4.8 mmol/L (ref 3.5–5.1)
Sodium: 138 mmol/L (ref 135–145)
Total Bilirubin: 0.3 mg/dL (ref 0.3–1.2)
Total Protein: 8.5 g/dL — ABNORMAL HIGH (ref 6.5–8.1)

## 2021-07-06 LAB — TSH: TSH: 0.993 u[IU]/mL (ref 0.308–3.960)

## 2021-07-06 MED ORDER — IOHEXOL 350 MG/ML SOLN
50.0000 mL | Freq: Once | INTRAVENOUS | Status: AC | PRN
  Administered 2021-07-06: 50 mL via INTRAVENOUS

## 2021-07-06 MED ORDER — SODIUM CHLORIDE (PF) 0.9 % IJ SOLN
INTRAMUSCULAR | Status: AC
  Filled 2021-07-06: qty 50

## 2021-07-07 ENCOUNTER — Telehealth: Payer: Self-pay | Admitting: Nutrition

## 2021-07-07 ENCOUNTER — Inpatient Hospital Stay (HOSPITAL_BASED_OUTPATIENT_CLINIC_OR_DEPARTMENT_OTHER): Payer: Medicare Other | Admitting: Internal Medicine

## 2021-07-07 ENCOUNTER — Encounter: Payer: Self-pay | Admitting: Internal Medicine

## 2021-07-07 VITALS — BP 151/70 | HR 102 | Temp 97.9°F | Resp 20 | Ht 65.0 in | Wt 190.7 lb

## 2021-07-07 DIAGNOSIS — E079 Disorder of thyroid, unspecified: Secondary | ICD-10-CM | POA: Diagnosis not present

## 2021-07-07 DIAGNOSIS — E119 Type 2 diabetes mellitus without complications: Secondary | ICD-10-CM | POA: Diagnosis not present

## 2021-07-07 DIAGNOSIS — I10 Essential (primary) hypertension: Secondary | ICD-10-CM | POA: Diagnosis not present

## 2021-07-07 DIAGNOSIS — C3490 Malignant neoplasm of unspecified part of unspecified bronchus or lung: Secondary | ICD-10-CM | POA: Diagnosis not present

## 2021-07-07 DIAGNOSIS — Z9221 Personal history of antineoplastic chemotherapy: Secondary | ICD-10-CM | POA: Diagnosis not present

## 2021-07-07 DIAGNOSIS — C342 Malignant neoplasm of middle lobe, bronchus or lung: Secondary | ICD-10-CM

## 2021-07-07 DIAGNOSIS — Z85118 Personal history of other malignant neoplasm of bronchus and lung: Secondary | ICD-10-CM | POA: Diagnosis not present

## 2021-07-07 DIAGNOSIS — Z79899 Other long term (current) drug therapy: Secondary | ICD-10-CM | POA: Diagnosis not present

## 2021-07-07 DIAGNOSIS — Z794 Long term (current) use of insulin: Secondary | ICD-10-CM | POA: Diagnosis not present

## 2021-07-07 DIAGNOSIS — Z923 Personal history of irradiation: Secondary | ICD-10-CM | POA: Diagnosis not present

## 2021-07-07 NOTE — Progress Notes (Signed)
[place] [phone]   [phone]  OFFICE PROGRESS NOTE  Ronnald Nian, DO No address on file  DIAGNOSIS: Stage IIIB (T3, N3, M0) non-small cell lung cancer, squamous cell carcinoma presented with large right middle lobe lung breast in addition to mediastinal and bilateral hilar lymphadenopathy and suspicious right supraclavicular lymph node diagnosed in August 2019.    PRIOR THERAPY:  1) A course of concurrent chemoradiation with weekly carboplatin for AUC of 2 and paclitaxel 45 MG/M2.  First dose of chemotherapy given on 05/29/2018.  Status post 5 cycles. 2) Consolidation treatment with immunotherapy with Imfinzi 10 mg/KG every 2 weeks.  First dose August 10, 2018.  Status post 26 cycles.   CURRENT THERAPY:  Observation.  INTERVAL HISTORY: Janney Priego 75 y.o. female returns to the clinic today for 83-monthfollow-up visit.  The patient is feeling fine today with no concerning complaints except for the baseline shortness of breath increased with exertion.  She also has some aching pain and arthralgia in her hip and knees.  She is currently on Lipitor and she attributed her arthralgia and aching pain to this medication.  She is trying to establish care with another primary care physician after her previous physician left the office.  The patient is here today for evaluation with repeat CT scan of the chest for restaging of her disease. She has no nausea, vomiting, diarrhea or constipation.  She has no headache or visual changes.  MEDICAL HISTORY: Past Medical History:  Diagnosis Date   Blood transfusion without reported diagnosis    Cataract    DM (diabetes mellitus) ([facility]    GERD (gastroesophageal reflux disease)    Hyperlipidemia    Hypertension    Iron deficiency anemia    NSCL ca dx'd 03/2018   Thyroid disease     ALLERGIES:  is allergic to paclitaxel, aspirin, esomeprazole magnesium, and ace inhibitors.  MEDICATIONS:   Current Outpatient Medications  Medication Sig Dispense Refill   atorvastatin (LIPITOR) 40 MG tablet Take 1 tablet (40 mg total) by mouth at bedtime. 90 tablet 3   blood glucose meter kit and supplies KIT Use up to four times daily as directed 1 each 0   dapagliflozin propanediol (FARXIGA) 10 MG TABS tablet Take 1 tablet (10 mg total) by mouth daily before breakfast. 90 tablet 3   DEXILANT 30 MG capsule Take 1 capsule (30 mg total) by mouth daily. 90 capsule 3   diphenhydrAMINE (BENADRYL) 2 % cream Apply 1 application topically as needed for itching.     docusate sodium (COLACE) 100 MG capsule Take 100 mg by mouth daily as needed for mild constipation.     Ferrous Sulfate (IRON) 325 (65 Fe) MG TABS Take 1 tablet (325 mg total) by mouth 2 (two) times daily with a meal. 180 tablet 0   glipiZIDE (GLUCOTROL) 10 MG tablet Take 1 tablet (10 mg total) by mouth daily before breakfast. 90 tablet 3   insulin glargine (LANTUS SOLOSTAR) 100 UNIT/ML Solostar Pen Inject 30 Units into the skin every morning. And 31G, 583mpen needles 1/day 30 mL PRN   labetalol (NORMODYNE) 100 MG tablet Take 0.5 tablets (50 mg total) by mouth 2 (two) times daily. 90 tablet 3   levothyroxine (SYNTHROID) 75 MCG tablet Take 1 tablet (75 mcg total) by mouth daily. 90 tablet 3   losartan (COZAAR) 50 MG tablet Take 1 tablet (50 mg total) by mouth daily. 90 tablet 3  Multiple Vitamin (MULTIVITAMIN WITH MINERALS) TABS tablet Take 1 tablet by mouth at bedtime.      Current Facility-Administered Medications  Medication Dose Route Frequency Provider Last Rate Last Admin   methylPREDNISolone acetate (DEPO-MEDROL) injection 60 mg  60 mg Intramuscular Once Cirigliano, Mary K, DO       Facility-Administered Medications Ordered in Other Visits  Medication Dose Route Frequency Provider Last Rate Last Admin   promethazine (PHENERGAN) injection 12.5 mg  12.5 mg Intravenous Once Harle Stanford., PA-C       Tbo-Filgrastim Galen Daft) injection 300  mcg  300 mcg Subcutaneous Once Curt Bears, MD        SURGICAL HISTORY:  Past Surgical History:  Procedure Laterality Date   CATARACT EXTRACTION Right    CHOLECYSTECTOMY     COLONOSCOPY  10/24/2009   normal rectum/1X1cm abnormal lesion in the ascending colon (bx benign). TI normal for 10cm.  Prep difficult/inadequate. f/u TCS 09/2012 recommended   COLONOSCOPY  10/13/2004   Normal rectum/Diminutive polyps, splenic flexure, cold biopsied/removed.  Remainder of colonic mucosa appeared normal.   COLONOSCOPY N/A 12/14/2012   PRF:[id number] polyp-tubular adenoma   ESOPHAGOGASTRODUODENOSCOPY  10/13/2004    Normal esophagus/ Nodular volcano like lesion in the antrum, either representing a  pancreatic rest or leiomyoma, biopsied.  Remainder of the gastric mucosa appeared normal, normal D1-D2   ESOPHAGOGASTRODUODENOSCOPY  10/24/2009   Benign biopsies. normal esophagus/small hiatal hernia/nodular lesion antrum/distal greater curvature. duodenal AVM s/p ablation   GIVENS CAPSULE STUDY  07/27/2010    multiple arteriovenous malformations which could definitely be the contributor to her drifting hemoglobin and hematocrit   IR THORACENTESIS ASP PLEURAL SPACE W/IMG GUIDE  12/16/2020   TUBAL LIGATION     VIDEO BRONCHOSCOPY WITH ENDOBRONCHIAL ULTRASOUND N/A 04/27/2018   Procedure: VIDEO BRONCHOSCOPY WITH ENDOBRONCHIAL ULTRASOUND;  Surgeon: Melrose Nakayama, MD;  Location: [facility];  Service: Thoracic;  Laterality: N/A;    REVIEW OF SYSTEMS:  A comprehensive review of systems was negative except for: Constitutional: positive for fatigue Respiratory: positive for dyspnea on exertion   PHYSICAL EXAMINATION: General appearance: alert, cooperative, fatigued, and no distress Head: Normocephalic, without obvious abnormality, atraumatic Neck: no adenopathy, no JVD, supple, symmetrical, trachea midline, and thyroid not enlarged, symmetric, no tenderness/mass/nodules Lymph nodes: Cervical, supraclavicular, and  axillary nodes normal. Resp: clear to auscultation bilaterally Back: symmetric, no curvature. ROM normal. No CVA tenderness. Cardio: regular rate and rhythm, S1, S2 normal, no murmur, click, rub or gallop GI: soft, non-tender; bowel sounds normal; no masses,  no organomegaly Extremities: extremities normal, atraumatic, no cyanosis or edema  ECOG PERFORMANCE STATUS: 1 - Symptomatic but completely ambulatory  Blood pressure (!) 151/70, pulse (!) 102, temperature 97.9 F (36.6 C), temperature source Tympanic, resp. rate 20, height _0  (1.651 m), weight 190 lb 11.2 oz (86.5 kg), SpO2 98 %.  LABORATORY DATA: Lab Results  Component Value Date   WBC 4.2 07/06/2021   HGB 11.1 (L) 07/06/2021   HCT 36.9 07/06/2021   MCV 88.9 07/06/2021   PLT 200 07/06/2021      Chemistry      Component Value Date/Time   NA 138 07/06/2021 1150   K 4.8 07/06/2021 1150   CL 108 07/06/2021 1150   CO2 21 (L) 07/06/2021 1150   BUN 28 (H) 07/06/2021 1150   BUN 24 (A) 02/12/2020 0000   CREATININE 1.29 (H) 07/06/2021 1150   CREATININE 0.72 12/10/2011 0919      Component Value Date/Time   CALCIUM 9.4 07/06/2021  1150   ALKPHOS 92 07/06/2021 1150   ALKPHOS 88 12/10/2011 0919   AST 21 07/06/2021 1150   ALT 17 07/06/2021 1150   BILITOT 0.3 07/06/2021 1150       RADIOGRAPHIC STUDIES: CT CHEST WO CONTRAST  Result Date: 07/07/2021 CLINICAL DATA:  Non-small cell lung cancer restaging EXAM: CT CHEST WITHOUT CONTRAST TECHNIQUE: Multidetector CT imaging of the chest was performed following the standard protocol without IV contrast. COMPARISON:  12/12/2020 FINDINGS: Cardiovascular: Aortic atherosclerosis. Normal heart size. Three-vessel coronary artery calcifications. No pericardial effusion. Mediastinum/Nodes: Unchanged prominent subcentimeter mediastinal lymph nodes (series 3). Thyroid gland, trachea, and esophagus demonstrate no significant findings. Lungs/Pleura: Unchanged post treatment/post radiation  appearance of the right chest with dense, masslike consolidation and fibrosis of the perihilar right lung and a moderate, loculated appearing right pleural effusion unchanged compared to prior. Scattered irregular and ground-glass opacities throughout the left lung base, similar to prior examination. Mild underlying centrilobular and paraseptal emphysema. Occasional small nodules of the left lung are stable and almost certainly benign for example a 0.4 cm nodule of the peripheral left upper lobe (series 5) 4). Upper Abdomen: No acute abnormality. Musculoskeletal: No chest wall mass or suspicious bone lesions identified. IMPRESSION: 1. Unchanged post treatment/post radiation appearance of the right chest with dense, masslike consolidation and fibrosis of the perihilar right lung and a moderate, loculated appearing right pleural effusion. 2. Scattered irregular and ground-glass opacities throughout the left lung base, similar to prior examination and consistent with nonspecific infection or inflammation and sequelae there of. Continued attention on follow-up. 3. Occasional small nodules of the left lung are stable and almost certainly benign, incidental sequelae of prior infection or inflammation. 4. Emphysema. 5. Coronary artery disease. Aortic Atherosclerosis (ICD10-I70.0) and Emphysema (ICD10-J43.9). Electronically Signed   By: Delanna Ahmadi M.D.   On: 07/07/2021 12:46     ASSESSMENT AND PLAN: This is a very pleasant 75 years old African-American female recently diagnosed with a stage IIIB non-small cell lung cancer, squamous cell carcinoma.  She completed a course of concurrent chemoradiation with weekly carboplatin and paclitaxel status post 5 cycles with partial response.  She tolerated this treatment well except for the pancytopenia and fatigue.  The patient completed a course of treatment with consolidation immunotherapy with Imfinzi status post 26 cycles.  She tolerated her treatment well with no  concerning adverse effects. The patient is currently on observation and she is feeling fine today with no concerning complaints. She had repeat CT scan of the chest performed yesterday.  I personally and independently reviewed the scans and discussed the results with the patient today. Her scan showed no concerning findings for disease progression and she continues to have the radiation fibrosis and consolidation as well as loculated right pleural effusion. I recommended for the patient to continue on observation with repeat CT scan of the chest in 6 months. She will try to establish care with another primary care physician for adjustment of her medication. She was advised to call immediately if she has any other concerning symptoms in the interval. The patient voices understanding of current disease status and treatment options and is in agreement with the current care plan. All questions were answered. The patient knows to call the clinic with any problems, questions or concerns. We can certainly see the patient much sooner if necessary.  Disclaimer: This note was dictated with voice recognition software. Similar sounding words can inadvertently be transcribed and may not be corrected upon review.

## 2021-07-07 NOTE — Telephone Encounter (Signed)
Patient was called and told to bring the sensor in a zip lock bag to her visit on the 5th..  She agreed to do this.

## 2021-07-07 NOTE — Telephone Encounter (Signed)
Patient reports that they removed her 14 day profession CGM due to having a Cat Scan.  She has been wearing it for 5 days.

## 2021-07-16 ENCOUNTER — Telehealth: Payer: Self-pay | Admitting: Internal Medicine

## 2021-07-16 NOTE — Telephone Encounter (Signed)
Sch per 11/30,pt request mail calendar,calendar mailed, pt aware

## 2021-07-20 ENCOUNTER — Ambulatory Visit (INDEPENDENT_AMBULATORY_CARE_PROVIDER_SITE_OTHER): Payer: Medicare Other | Admitting: Endocrinology

## 2021-07-20 ENCOUNTER — Other Ambulatory Visit: Payer: Self-pay

## 2021-07-20 VITALS — BP 130/60 | HR 85 | Ht 65.0 in | Wt 194.6 lb

## 2021-07-20 DIAGNOSIS — E119 Type 2 diabetes mellitus without complications: Secondary | ICD-10-CM

## 2021-07-20 MED ORDER — LANTUS SOLOSTAR 100 UNIT/ML ~~LOC~~ SOPN: 40.0000 [IU] | PEN_INJECTOR | SUBCUTANEOUS | 99 refills | Status: DC

## 2021-07-20 NOTE — Patient Instructions (Addendum)
check your blood sugar twice a day.  vary the time of day when you check, between before the 3 meals, and at bedtime.  also check if you have symptoms of your blood sugar being too high or too low.  please keep a record of the readings and bring it to your next appointment here (or you can bring the meter itself).  You can write it on any piece of paper.  please call us sooner if your blood sugar goes below 70, or if you have a lot of readings over 200.   I have sent a prescription to your pharmacy, to increase the Lantus to 40 units each morning, and:   Please stop taking the glipizide.   Please continue the same [place].   Please come back for a follow-up appointment in 3 months.

## 2021-07-20 NOTE — Progress Notes (Signed)
Subjective:    Patient ID: Jenna [name], female    DOB: 07/24/1946, 75 y.o.   MRN: [id number]  HPI Pt returns for f/u of diabetes mellitus:  DM type: Insulin-requiring type 2.   Dx'ed: 1638 Complications: stage 3 CRI and PAD.   Therapy: insulin since 2022, and 2 oral meds.   GDM: never DKA: never Severe hypoglycemia: never Pancreatitis: never Pancreatic imaging: never SDOH: She declines to check cbg's.  She also declines personal CGM.   Other: she lost weight with XRT, but regained afterwards; she did not tolerate Rybelsus (abd pain).   Interval history: pt states she feels well in general.  She takes meds as rx'ed.  I reviewed continuous glucose monitor data.  Glucose varies from 45-260.  It is in general highest at 12N, and less so at 10PM.  It is in general lowest at 4AM-7AM.   Past Medical History:  Diagnosis Date   Blood transfusion without reported diagnosis    Cataract    DM (diabetes mellitus) ([place])    GERD (gastroesophageal reflux disease)    Hyperlipidemia    Hypertension    Iron deficiency anemia    NSCL ca dx'd 03/2018   Thyroid disease     Past Surgical History:  Procedure Laterality Date   CATARACT EXTRACTION Right    CHOLECYSTECTOMY     COLONOSCOPY  10/24/2009   normal rectum/1X1cm abnormal lesion in the ascending colon (bx benign). TI normal for 10cm.  Prep difficult/inadequate. f/u TCS 09/2012 recommended   COLONOSCOPY  10/13/2004   Normal rectum/Diminutive polyps, splenic flexure, cold biopsied/removed.  Remainder of colonic mucosa appeared normal.   COLONOSCOPY N/A 12/14/2012   GYK:[id number] polyp-tubular adenoma   ESOPHAGOGASTRODUODENOSCOPY  10/13/2004    Normal esophagus/ Nodular volcano like lesion in the antrum, either representing a  pancreatic rest or leiomyoma, biopsied.  Remainder of the gastric mucosa appeared normal, normal D1-D2   ESOPHAGOGASTRODUODENOSCOPY  10/24/2009   Benign biopsies. normal esophagus/small hiatal hernia/nodular lesion  antrum/distal greater curvature. duodenal AVM s/p ablation   GIVENS CAPSULE STUDY  07/27/2010    multiple arteriovenous malformations which could definitely be the contributor to her drifting hemoglobin and hematocrit   IR THORACENTESIS ASP PLEURAL SPACE W/IMG GUIDE  12/16/2020   TUBAL LIGATION     VIDEO BRONCHOSCOPY WITH ENDOBRONCHIAL ULTRASOUND N/A 04/27/2018   Procedure: VIDEO BRONCHOSCOPY WITH ENDOBRONCHIAL ULTRASOUND;  Surgeon: Melrose Nakayama, MD;  Location: [facility];  Service: Thoracic;  Laterality: N/A;    Social History   Socioeconomic History   Marital status: Single    Spouse name: Not on file   Number of children: 3   Years of education: Not on file   Highest education level: Not on file  Occupational History   Occupation: retired  Tobacco Use   Smoking status: Former    Packs/day: 0.50    Years: 55.00    Pack years: 27.50    Types: Cigarettes    Quit date: 03/18/2018    Years since quitting: 3.3   Smokeless tobacco: Never   Tobacco comments:    smoked off and n  Vaping Use   Vaping Use: Never used  Substance and Sexual Activity   Alcohol use: No   Drug use: No   Sexual activity: Not Currently  Other Topics Concern   Not on file  Social History Narrative   Patient fully vaccinated   Social Determinants of Health   Financial Resource Strain: Low Risk    Difficulty of Paying Living  Expenses: Not hard at all  Food Insecurity: No Food Insecurity   Worried About Charity fundraiser in the Last Year: Never true   Ran Out of Food in the Last Year: Never true  Transportation Needs: No Transportation Needs   Lack of Transportation (Medical): No   Lack of Transportation (Non-Medical): No  Physical Activity: Inactive   Days of Exercise per Week: 0 days   Minutes of Exercise per Session: 0 min  Stress: No Stress Concern Present   Feeling of Stress : Not at all  Social Connections: Socially Isolated   Frequency of Communication with Friends and Family: More than  three times a week   Frequency of Social Gatherings with Friends and Family: More than three times a week   Attends Religious Services: Never   Marine scientist or Organizations: No   Attends Music therapist: Never   Marital Status: Never married  Human resources officer Violence: Not At Risk   Fear of Current or Ex-Partner: No   Emotionally Abused: No   Physically Abused: No   Sexually Abused: No    Current Outpatient Medications on File Prior to Visit  Medication Sig Dispense Refill   atorvastatin (LIPITOR) 40 MG tablet Take 1 tablet (40 mg total) by mouth at bedtime. 90 tablet 3   B-D UF III MINI PEN NEEDLES 31G X 5 MM MISC Inject into the skin every morning.     blood glucose meter kit and supplies KIT Use up to four times daily as directed 1 each 0   dapagliflozin propanediol (FARXIGA) 10 MG TABS tablet Take 1 tablet (10 mg total) by mouth daily before breakfast. 90 tablet 3   DEXILANT 30 MG capsule Take 1 capsule (30 mg total) by mouth daily. 90 capsule 3   diphenhydrAMINE (BENADRYL) 2 % cream Apply 1 application topically as needed for itching.     docusate sodium (COLACE) 100 MG capsule Take 100 mg by mouth daily as needed for mild constipation.     Ferrous Sulfate (IRON) 325 (65 Fe) MG TABS Take 1 tablet (325 mg total) by mouth 2 (two) times daily with a meal. 180 tablet 0   labetalol (NORMODYNE) 100 MG tablet Take 0.5 tablets (50 mg total) by mouth 2 (two) times daily. 90 tablet 3   levothyroxine (SYNTHROID) 75 MCG tablet Take 1 tablet (75 mcg total) by mouth daily. 90 tablet 3   losartan (COZAAR) 50 MG tablet Take 1 tablet (50 mg total) by mouth daily. 90 tablet 3   Multiple Vitamin (MULTIVITAMIN WITH MINERALS) TABS tablet Take 1 tablet by mouth at bedtime.      Current Facility-Administered Medications on File Prior to Visit  Medication Dose Route Frequency Provider Last Rate Last Admin   methylPREDNISolone acetate (DEPO-MEDROL) injection 60 mg  60 mg  Intramuscular Once Cirigliano, Mary K, DO       promethazine (PHENERGAN) injection 12.5 mg  12.5 mg Intravenous Once Sandi Mealy E., PA-C       Tbo-Filgrastim Galen Daft) injection 300 mcg  300 mcg Subcutaneous Once Curt Bears, MD        Allergies  Allergen Reactions   Paclitaxel Other (See Comments)    Unresponsiveness shortly after Taxol inf started 06/12/18.   Aspirin Other (See Comments)    Stomach bleeding    Esomeprazole Magnesium     UNSPECIFIED REACTION    Ace Inhibitors Other (See Comments)    Dizziness, drunk like    Family History  Problem  Relation Age of Onset   Diabetes Sister    Colon cancer Maternal Aunt        greater than age 52   Breast cancer Cousin    Diabetes Daughter    Diabetes Daughter    Amblyopia Neg Hx    Blindness Neg Hx    Cataracts Neg Hx    Glaucoma Neg Hx    Macular degeneration Neg Hx    Retinal detachment Neg Hx    Strabismus Neg Hx    Retinitis pigmentosa Neg Hx    Rectal cancer Neg Hx    Stomach cancer Neg Hx    Colon polyps Neg Hx    Esophageal cancer Neg Hx     BP 130/60   Pulse 85   Ht $R'5\' 5"'ZN$  (1.651 m)   Wt 194 lb 9.6 oz (88.3 kg)   SpO2 97%   BMI 32.38 kg/m    Review of Systems She denies hypoglycemia.      Objective:   Physical Exam    Lab Results  Component Value Date   HGBA1C 8.2 (A) 07/01/2021      Assessment & Plan:  Insulin-requiring type 2 DM: uncontrolled Hypoglycemia, due to insulin/glipizide  Patient Instructions  check your blood sugar twice a day.  vary the time of day when you check, between before the 3 meals, and at bedtime.  also check if you have symptoms of your blood sugar being too high or too low.  please keep a record of the readings and bring it to your next appointment here (or you can bring the meter itself).  You can write it on any piece of paper.  please call us sooner if your blood sugar goes below 70, or if you have a lot of readings over 200.   I have sent a prescription to your  pharmacy, to increase the Lantus to 40 units each morning, and:   Please stop taking the glipizide.   Please continue the same [place].   Please come back for a follow-up appointment in 3 months.

## 2021-08-01 ENCOUNTER — Other Ambulatory Visit: Payer: Self-pay | Admitting: Endocrinology

## 2021-08-04 NOTE — Telephone Encounter (Signed)
MEDICATION: levothyroxine (SYNTHROID) 75 MCG tablet [place] ([place] - [address] Phone:  [phone]  Fax:  [phone]     PHARMACY:    HAS THE PATIENT CONTACTED THEIR PHARMACY?  YES  IS THIS A 90 DAY SUPPLY : YES  IS PATIENT OUT OF MEDICATION: 2 DAYS LEFT  IF NOT; HOW MUCH IS LEFT:   LAST APPOINTMENT DATE: @11 /22/2022  NEXT APPOINTMENT DATE:@12 /08/2020  DO WE HAVE YOUR PERMISSION TO LEAVE A DETAILED MESSAGE?:  OTHER COMMENTS:    **Let patient know to contact pharmacy at the end of the day to make sure medication is ready. **  ** Please notify patient to allow 48-72 hours to process**  **Encourage patient to contact the pharmacy for refills or they can request refills through [facility]**

## 2021-08-24 ENCOUNTER — Telehealth: Payer: Self-pay | Admitting: Endocrinology

## 2021-08-24 NOTE — Telephone Encounter (Signed)
Patient requests to be called at ph# [phone] re: Patient wants to know if Levothyroxine is the same type of medication that Patient used to be prescribed that is spelled similar to Eurothyrox

## 2021-08-24 NOTE — Telephone Encounter (Signed)
Spoke with pt regarding her questions/concerns with Levothyroxine and Eurothyrox

## 2021-09-01 ENCOUNTER — Other Ambulatory Visit: Payer: Self-pay

## 2021-09-01 ENCOUNTER — Emergency Department (HOSPITAL_COMMUNITY)
Admission: EM | Admit: 2021-09-01 | Discharge: 2021-09-01 | Disposition: A | Discharge status: Home / Self Care | Payer: Medicare Other | Attending: Emergency Medicine | Admitting: Emergency Medicine

## 2021-09-01 ENCOUNTER — Emergency Department (HOSPITAL_COMMUNITY): Payer: Medicare Other

## 2021-09-01 ENCOUNTER — Encounter: Payer: Self-pay | Admitting: Internal Medicine

## 2021-09-01 DIAGNOSIS — R059 Cough, unspecified: Secondary | ICD-10-CM | POA: Diagnosis not present

## 2021-09-01 DIAGNOSIS — E119 Type 2 diabetes mellitus without complications: Secondary | ICD-10-CM | POA: Insufficient documentation

## 2021-09-01 DIAGNOSIS — R0602 Shortness of breath: Secondary | ICD-10-CM | POA: Diagnosis not present

## 2021-09-01 DIAGNOSIS — J90 Pleural effusion, not elsewhere classified: Secondary | ICD-10-CM | POA: Diagnosis not present

## 2021-09-01 DIAGNOSIS — Z87891 Personal history of nicotine dependence: Secondary | ICD-10-CM | POA: Insufficient documentation

## 2021-09-01 DIAGNOSIS — Z85118 Personal history of other malignant neoplasm of bronchus and lung: Secondary | ICD-10-CM | POA: Insufficient documentation

## 2021-09-01 DIAGNOSIS — I10 Essential (primary) hypertension: Secondary | ICD-10-CM | POA: Diagnosis not present

## 2021-09-01 DIAGNOSIS — Z20822 Contact with and (suspected) exposure to covid-19: Secondary | ICD-10-CM | POA: Insufficient documentation

## 2021-09-01 DIAGNOSIS — R531 Weakness: Secondary | ICD-10-CM | POA: Diagnosis not present

## 2021-09-01 DIAGNOSIS — R051 Acute cough: Secondary | ICD-10-CM | POA: Diagnosis not present

## 2021-09-01 DIAGNOSIS — R0789 Other chest pain: Secondary | ICD-10-CM | POA: Insufficient documentation

## 2021-09-01 DIAGNOSIS — R5383 Other fatigue: Secondary | ICD-10-CM | POA: Insufficient documentation

## 2021-09-01 LAB — RESP PANEL BY RT-PCR (FLU A&B, COVID) ARPGX2
Influenza A by PCR: NEGATIVE
Influenza B by PCR: NEGATIVE
SARS Coronavirus 2 by RT PCR: NEGATIVE

## 2021-09-01 LAB — BASIC METABOLIC PANEL
Anion gap: 10 (ref 5–15)
BUN: 22 mg/dL (ref 8–23)
CO2: 21 mmol/L — ABNORMAL LOW (ref 22–32)
Calcium: 8.6 mg/dL — ABNORMAL LOW (ref 8.9–10.3)
Chloride: 105 mmol/L (ref 98–111)
Creatinine, Ser: 1.37 mg/dL — ABNORMAL HIGH (ref 0.44–1.00)
GFR, Estimated: 40 mL/min — ABNORMAL LOW (ref >60)
Glucose, Bld: 160 mg/dL — ABNORMAL HIGH (ref 70–99)
Potassium: 4.1 mmol/L (ref 3.5–5.1)
Sodium: 136 mmol/L (ref 135–145)

## 2021-09-01 LAB — BRAIN NATRIURETIC PEPTIDE: B Natriuretic Peptide: 127.9 pg/mL — ABNORMAL HIGH (ref 0.0–100.0)

## 2021-09-01 LAB — CBC
HCT: 32 % — ABNORMAL LOW (ref 36.0–46.0)
Hemoglobin: 9.9 g/dL — ABNORMAL LOW (ref 12.0–15.0)
MCH: 27.2 pg (ref 26.0–34.0)
MCHC: 30.9 g/dL (ref 30.0–36.0)
MCV: 87.9 fL (ref 80.0–100.0)
Platelets: 192 10*3/uL (ref 150–400)
RBC: 3.64 MIL/uL — ABNORMAL LOW (ref 3.87–5.11)
RDW: 15.5 % (ref 11.5–15.5)
WBC: 8.7 10*3/uL (ref 4.0–10.5)
nRBC: 0 % (ref 0.0–0.2)

## 2021-09-01 LAB — TROPONIN I (HIGH SENSITIVITY)
Troponin I (High Sensitivity): 12 ng/L (ref <18)
Troponin I (High Sensitivity): 11 ng/L (ref <18)

## 2021-09-01 MED ORDER — FUROSEMIDE 20 MG PO TABS: 20.0000 mg | ORAL_TABLET | Freq: Every day | ORAL | 0 refills | Status: DC

## 2021-09-01 MED ORDER — ALBUTEROL SULFATE HFA 108 (90 BASE) MCG/ACT IN AERS
2.0000 | INHALATION_SPRAY | Freq: Once | RESPIRATORY_TRACT | Status: AC
  Administered 2021-09-01: 2 via RESPIRATORY_TRACT
  Filled 2021-09-01: qty 6.7

## 2021-09-01 MED ORDER — DOXYCYCLINE HYCLATE 100 MG PO CAPS
100.0000 mg | ORAL_CAPSULE | Freq: Two times a day (BID) | ORAL | 0 refills | Status: AC
Start: 2021-09-01 — End: 2021-09-08

## 2021-09-01 MED ORDER — FUROSEMIDE 10 MG/ML IJ SOLN
20.0000 mg | Freq: Once | INTRAMUSCULAR | Status: AC
  Administered 2021-09-01: 20 mg via INTRAVENOUS
  Filled 2021-09-01: qty 2

## 2021-09-01 MED ORDER — DOXYCYCLINE HYCLATE 100 MG PO TABS
100.0000 mg | ORAL_TABLET | Freq: Once | ORAL | Status: AC
Start: 2021-09-01 — End: 2021-09-01
  Administered 2021-09-01: 100 mg via ORAL
  Filled 2021-09-01: qty 1

## 2021-09-01 MED ORDER — ALBUTEROL SULFATE HFA 108 (90 BASE) MCG/ACT IN AERS: 1.0000 | INHALATION_SPRAY | Freq: Four times a day (QID) | RESPIRATORY_TRACT | 0 refills | Status: DC | PRN

## 2021-09-01 NOTE — Discharge Instructions (Signed)
You were seen today with shortness of breath and cough.  I am starting you on several days of Lasix which is a fluid medication.  I am also starting on some antibiotics and have called in an albuterol inhaler.  Please follow closely with your primary care doctor for repeat blood work and evaluation in the next 1 to 2 weeks.  If you develop any new or suddenly worsening symptoms please return to the emergency department for reevaluation.

## 2021-09-01 NOTE — ED Provider Notes (Signed)
Emergency Department Provider Note   I have reviewed the triage vital signs and the nursing notes.   HISTORY  Chief Complaint Cough, Shortness of Breath, Fatigue, and Chest Pain   HPI Jenna [name] is a 76 y.o. female past medical history reviewed below including recurrent right-sided pleural effusion, remote history of non-small cell lung cancer, hypertension, diabetes, hyperlipidemia presents with productive cough and shortness of breath.  Symptoms developing over the past 48 hours.  She not had fevers.  She denies any chest pain. Has some chest soreness with coughing but no pain when not coughing. No pleuritic pain. No abdominal symptoms. Does endorse some fatigue.     Past Medical History:  Diagnosis Date   Blood transfusion without reported diagnosis    Cataract    DM (diabetes mellitus) ([place])    GERD (gastroesophageal reflux disease)    Hyperlipidemia    Hypertension    Iron deficiency anemia    NSCL ca dx'd 03/2018   Thyroid disease     Review of Systems  Constitutional: No fever/chills. Positive fatigue.  ENT: No sore throat. Cardiovascular: Positive chest tightness with coughing.  Respiratory: Mild shortness of breath. Positive cough.  Gastrointestinal: No abdominal pain.  No nausea, no vomiting.  No diarrhea.  No constipation. Musculoskeletal: Negative for back pain. Skin: Negative for rash. Neurological: Negative for headaches, focal weakness or numbness.   ____________________________________________   PHYSICAL EXAM:  VITAL SIGNS: ED Triage Vitals  Enc Vitals Group     BP 09/01/21 1143 (!) 143/45     Pulse Rate 09/01/21 1143 93     Resp 09/01/21 1143 (!) 26     Temp 09/01/21 1143 99.8 F (37.7 C)     Temp Source 09/01/21 1143 Oral     SpO2 09/01/21 1143 97 %   Constitutional: Alert and oriented. Well appearing and in no acute distress. Eyes: Conjunctivae are normal.  Head: Atraumatic. Nose: No congestion/rhinnorhea. Mouth/Throat:  Mucous membranes are moist.  Neck: No stridor.  Cardiovascular: Normal rate, regular rhythm. Good peripheral circulation. Grossly normal heart sounds.   Respiratory: Normal respiratory effort.  No retractions. Lungs diminished at the right base. No wheezing. No rhonchi.  Gastrointestinal: Soft and nontender. No distention.  Musculoskeletal: No lower extremity tenderness nor edema. No gross deformities of extremities. Neurologic:  Normal speech and language.  Skin:  Skin is warm, dry and intact. No rash noted.   ____________________________________________   LABS (all labs ordered are listed, but only abnormal results are displayed)  Labs Reviewed  BASIC METABOLIC PANEL - Abnormal; Notable for the following components:      Result Value   CO2 21 (*)    Glucose, Bld 160 (*)    Creatinine, Ser 1.37 (*)    Calcium 8.6 (*)    GFR, Estimated 40 (*)    All other components within normal limits  CBC - Abnormal; Notable for the following components:   RBC 3.64 (*)    Hemoglobin 9.9 (*)    HCT 32.0 (*)    All other components within normal limits  BRAIN NATRIURETIC PEPTIDE - Abnormal; Notable for the following components:   B Natriuretic Peptide 127.9 (*)    All other components within normal limits  RESP PANEL BY RT-PCR (FLU A&B, COVID) ARPGX2  TROPONIN I (HIGH SENSITIVITY)  TROPONIN I (HIGH SENSITIVITY)   ____________________________________________  EKG   EKG Interpretation  Date/Time:  Tuesday September 01 2021 11:45:04 EST Ventricular Rate:  90 PR Interval:  148 QRS  Duration: 82 QT Interval:  348 QTC Calculation: 425 R Axis:   -24 Text Interpretation: Normal sinus rhythm with sinus arrhythmia Cannot rule out Anterior infarct , age undetermined Abnormal ECG When compared with ECG of 16-Dec-2020 00:11, PREVIOUS ECG IS PRESENT Confirmed by Nanda Quinton [id number]) on 09/01/2021 1:05:50 PM        ____________________________________________  RADIOLOGY  DG Chest 2  View  Result Date: 09/01/2021 CLINICAL DATA:  Shortness of breath and weakness. Productive cough for 2 days. EXAM: CHEST - 2 VIEW COMPARISON:  Chest CT 07/06/2021.  Radiographs 01/28/2021. FINDINGS: The heart size and mediastinal contours are stable. There is stable volume loss in the right hemithorax with radiation changes in the right perihilar region and a small right pleural effusion, not significantly changed from previous studies. The left lung appears clear. There is no pneumothorax. The bones appear unremarkable. IMPRESSION: Stable treatment changes in the right lung and small right pleural effusion. No acute findings demonstrated. Electronically Signed   By: Richardean Sale M.D.   On: 09/01/2021 12:17    ____________________________________________   PROCEDURES  Procedure(s) performed:   Procedures  None  ____________________________________________   INITIAL IMPRESSION / ASSESSMENT AND PLAN / ED COURSE  Pertinent labs & imaging results that were available during my care of the patient were reviewed by me and considered in my medical decision making (see chart for details).   This patient is Presenting for Evaluation of SOB/CP, which does require a range of treatment options, and is a complaint that involves a high risk of morbidity and mortality.  The Differential Diagnoses includes all life-threatening causes for chest pain. This includes but is not exclusive to acute coronary syndrome, aortic dissection, pulmonary embolism, cardiac tamponade, community-acquired pneumonia, pericarditis, musculoskeletal chest wall pain, etc.   Critical Interventions- albuterol, troponin, EKG interpretation as above, and CXR interpretation.    Reassessment after intervention: Patient remains hemodynamically stable.   I decided to review pertinent External Data, and in summary patient admitted in May 2022 w/ PNA and moderate to large right sided pleural effusion. 464mL drained at that time.  Cytology negative for malignancy.    Clinical Laboratory Tests Ordered, included   Radiologic Tests Ordered, included CXR independently evaluated by me.  Notable right-sided pleural effusion similar to prior.  No large effusion or mediastinal shift.  No pneumothorax.  No focal infiltrate  Cardiac Monitor Tracing which shows NSR.    Social Determinants of Health Risk former smoker w/o COPD or asthma diagnosis.   Reevaluation with update and discussion with patient. She is feeling well after lasix and beginning to diuresis. She is comfortable with the plan for outpatient mgmt and PCP follow up.    Medical Decision Making: Summary:  Patient presents emergency department with chest discomfort as well as shortness of breath.  She has a small pleural effusion on the right but not moderate to large in size which is required drainage in the past.  Low suspicion for infected effusion or loculated effusion.  Plan for gentle diuresis over the next several days.  She does have some renal insufficiency but overall consistent with her prior values.  Low suspicion overall for ACS or PE.  Plan for close PCP follow-up.  Disposition: discharge  ____________________________________________  FINAL CLINICAL IMPRESSION(S) / ED DIAGNOSES  Final diagnoses:  SOB (shortness of breath)  Acute cough     MEDICATIONS GIVEN DURING THIS VISIT:  Medications  albuterol (VENTOLIN HFA) 108 (90 Base) MCG/ACT inhaler 2 puff (2 puffs Inhalation Given  09/01/21 1303)  furosemide (LASIX) injection 20 mg (20 mg Intravenous Given 09/01/21 1444)  doxycycline (VIBRA-TABS) tablet 100 mg (100 mg Oral Given 09/01/21 1443)     NEW OUTPATIENT MEDICATIONS STARTED DURING THIS VISIT:  Discharge Medication List as of 09/01/2021  3:33 PM     START taking these medications   Details  albuterol (VENTOLIN HFA) 108 (90 Base) MCG/ACT inhaler Inhale 1-2 puffs into the lungs every 6 (six) hours as needed for wheezing or shortness of  breath., Starting Tue 09/01/2021, Normal    doxycycline (VIBRAMYCIN) 100 MG capsule Take 1 capsule (100 mg total) by mouth 2 (two) times daily for 7 days., Starting Tue 09/01/2021, Until Tue 09/08/2021, Normal    furosemide (LASIX) 20 MG tablet Take 1 tablet (20 mg total) by mouth daily for 3 days., Starting Wed 09/02/2021, Until Sat 09/05/2021, Normal        Note:  This document was prepared using Dragon voice recognition software and may include unintentional dictation errors.  Nanda Quinton, MD, [facility] Emergency Medicine    Evita Merida, [facility], MD 09/02/21 [phone]

## 2021-09-01 NOTE — ED Triage Notes (Signed)
Pt. Stated, Im having a bad cough with some SOB and soreness across my chest and Im just feeling bad all day.

## 2021-09-14 ENCOUNTER — Ambulatory Visit (INDEPENDENT_AMBULATORY_CARE_PROVIDER_SITE_OTHER): Payer: Medicare Other | Admitting: Internal Medicine

## 2021-09-14 ENCOUNTER — Encounter: Payer: Self-pay | Admitting: Internal Medicine

## 2021-09-14 ENCOUNTER — Other Ambulatory Visit: Payer: Self-pay

## 2021-09-14 DIAGNOSIS — D509 Iron deficiency anemia, unspecified: Secondary | ICD-10-CM

## 2021-09-14 DIAGNOSIS — C342 Malignant neoplasm of middle lobe, bronchus or lung: Secondary | ICD-10-CM | POA: Diagnosis not present

## 2021-09-14 DIAGNOSIS — I10 Essential (primary) hypertension: Secondary | ICD-10-CM | POA: Diagnosis not present

## 2021-09-14 DIAGNOSIS — K219 Gastro-esophageal reflux disease without esophagitis: Secondary | ICD-10-CM | POA: Diagnosis not present

## 2021-09-14 DIAGNOSIS — R0602 Shortness of breath: Secondary | ICD-10-CM

## 2021-09-14 DIAGNOSIS — E785 Hyperlipidemia, unspecified: Secondary | ICD-10-CM | POA: Diagnosis not present

## 2021-09-14 DIAGNOSIS — E118 Type 2 diabetes mellitus with unspecified complications: Secondary | ICD-10-CM | POA: Diagnosis not present

## 2021-09-14 DIAGNOSIS — R051 Acute cough: Secondary | ICD-10-CM

## 2021-09-14 DIAGNOSIS — I700 Atherosclerosis of aorta: Secondary | ICD-10-CM

## 2021-09-14 DIAGNOSIS — E1169 Type 2 diabetes mellitus with other specified complication: Secondary | ICD-10-CM | POA: Diagnosis not present

## 2021-09-14 DIAGNOSIS — E039 Hypothyroidism, unspecified: Secondary | ICD-10-CM | POA: Diagnosis not present

## 2021-09-14 MED ORDER — LABETALOL HCL 100 MG PO TABS: 50.0000 mg | ORAL_TABLET | Freq: Two times a day (BID) | ORAL | 3 refills | Status: DC

## 2021-09-14 MED ORDER — LOSARTAN POTASSIUM 50 MG PO TABS: 50.0000 mg | ORAL_TABLET | Freq: Every day | ORAL | 3 refills | Status: DC

## 2021-09-14 MED ORDER — FLUTICASONE FUROATE-VILANTEROL 200-25 MCG/ACT IN AEPB: 1.0000 | INHALATION_SPRAY | Freq: Every day | RESPIRATORY_TRACT | 3 refills | Status: DC

## 2021-09-14 MED ORDER — AZITHROMYCIN 250 MG PO TABS: ORAL_TABLET | ORAL | 0 refills | Status: AC

## 2021-09-14 MED ORDER — PRAVASTATIN SODIUM 20 MG PO TABS: 20.0000 mg | ORAL_TABLET | Freq: Every day | ORAL | 3 refills | Status: DC

## 2021-09-14 NOTE — Progress Notes (Signed)
° °  Subjective:   Patient ID: Jenna [name], female    DOB: 1946-01-30, 76 y.o.   MRN: [id number]  HPI The patient is a 76 YO female coming in for transfer of care and concerns about recent cough and chronic SOB. Stopped taking atorvastatin due to muscle aches.   PMH, [facility], social history reviewed and updated  Review of Systems  Constitutional:  Positive for activity change and fatigue.  HENT: Negative.    Eyes: Negative.   Respiratory:  Positive for cough and shortness of breath. Negative for chest tightness.   Cardiovascular:  Negative for chest pain, palpitations and leg swelling.  Gastrointestinal:  Negative for abdominal distention, abdominal pain, constipation, diarrhea, nausea and vomiting.  Musculoskeletal: Negative.   Skin: Negative.   Neurological: Negative.   Psychiatric/Behavioral: Negative.     Objective:  Physical Exam Constitutional:      Appearance: She is well-developed.  HENT:     Head: Normocephalic and atraumatic.  Cardiovascular:     Rate and Rhythm: Normal rate and regular rhythm.  Pulmonary:     Effort: Pulmonary effort is normal. No respiratory distress.     Breath sounds: Wheezing and rhonchi present. No rales.  Abdominal:     General: Bowel sounds are normal. There is no distension.     Palpations: Abdomen is soft.     Tenderness: There is no abdominal tenderness. There is no rebound.  Musculoskeletal:     Cervical back: Normal range of motion.  Skin:    General: Skin is warm and dry.  Neurological:     Mental Status: She is alert and oriented to person, place, and time.     Coordination: Coordination normal.    Vitals:   09/14/21 1259  BP: 134/80  Pulse: 98  Resp: 18  SpO2: 99%  Weight: 192 lb 3.2 oz (87.2 kg)  Height: 5\' 5"  (1.651 m)    This visit occurred during the SARS-CoV-2 public health emergency.  Safety protocols were in place, including screening questions prior to the visit, additional usage of staff PPE, and extensive  cleaning of exam room while observing appropriate contact time as indicated for disinfecting solutions.   Assessment & Plan:  Visit time 25 minutes in face to face communication with patient and coordination of care, additional 15 minutes spent in record review, coordination or care, ordering tests, communicating/referring to other healthcare professionals, documenting in medical records all on the same day of the visit for total time 40 minutes spent on the visit.

## 2021-09-14 NOTE — Patient Instructions (Signed)
We have sent in the refills for you.  We have sent in azithromycin to take 2 pills today, then starting tomorrow take 1 pill daily.   We have sent in breo to do 1 puff daily everyday to help the breathing.

## 2021-09-15 NOTE — Assessment & Plan Note (Signed)
Prior radiation and chemotherapy and is under surveillance. Does have chronic dyspnea likely associated with treatment.

## 2021-09-15 NOTE — Assessment & Plan Note (Signed)
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 She does have chronic scar tissue and effusion on CXR consistently. She is not active and likely deconditioning contributes. Rx breo to use to see if this can help her SOB and then she can exercise more. Reviewed ECHO from 2021 with her without significant findings there.

## 2021-09-15 NOTE — Assessment & Plan Note (Signed)
Taking synthroid 75 mcg daily. No symptoms of over or under replacement.

## 2021-09-15 NOTE — Assessment & Plan Note (Signed)
Previously very poorly controlled most recent HgA1c markedly improved to 8.2. she is taking statin and ARB. She will continue to see endo for management and continue lantus 40 units daily and farxiga 10 mg daily.

## 2021-09-15 NOTE — Assessment & Plan Note (Signed)
BP at goal on labetalol 50 mg BID and losartan 50 mg daily. It is unclear if there is an indication for labetalol other than BP. Offered if she wanted adjustment for simplification we could do that as her other meds are daily except labetalol. She elects to leave this the same today.

## 2021-09-15 NOTE — Assessment & Plan Note (Signed)
We did review that she has this and the implications. She did stop taking atorvastatin due to muscle aches. Will prescribe pravastatin 20 mg daily to start taking.

## 2021-09-15 NOTE — Assessment & Plan Note (Signed)
Taking iron daily and last Hg 9.9 which was stable.

## 2021-09-15 NOTE — Assessment & Plan Note (Signed)
Recent ER visit with CXR without pneumonia. Since this is my first time meeting patient it is unclear if her lung exam findings are stable from prior with rhonchi in the left and right lung. Will treat with additional antibiotic azithromycin prescribed.

## 2021-09-15 NOTE — Assessment & Plan Note (Signed)
Stopped atorvastatin due to muscle cramps. Started pravastatin 20 mg daily today.

## 2021-09-15 NOTE — Assessment & Plan Note (Signed)
Taking dexilant 30 mg daily and this is controlling symptoms well. If cough does not improve could consider change to treatment.

## 2021-10-06 ENCOUNTER — Ambulatory Visit: Payer: Medicare Other

## 2021-10-20 ENCOUNTER — Ambulatory Visit: Payer: Medicare Other | Admitting: Endocrinology

## 2021-10-30 ENCOUNTER — Ambulatory Visit (INDEPENDENT_AMBULATORY_CARE_PROVIDER_SITE_OTHER): Payer: Medicare Other | Admitting: Endocrinology

## 2021-10-30 ENCOUNTER — Encounter: Payer: Self-pay | Admitting: Endocrinology

## 2021-10-30 ENCOUNTER — Other Ambulatory Visit: Payer: Self-pay

## 2021-10-30 VITALS — BP 148/70 | HR 93 | Ht 65.0 in | Wt 196.0 lb

## 2021-10-30 DIAGNOSIS — E118 Type 2 diabetes mellitus with unspecified complications: Secondary | ICD-10-CM | POA: Diagnosis not present

## 2021-10-30 LAB — POCT GLYCOSYLATED HEMOGLOBIN (HGB A1C): Hemoglobin A1C: 6.8 % — AB (ref 4.0–5.6)

## 2021-10-30 MED ORDER — LANTUS SOLOSTAR 100 UNIT/ML ~~LOC~~ SOPN: 36.0000 [IU] | PEN_INJECTOR | SUBCUTANEOUS | 99 refills | Status: DC

## 2021-10-30 NOTE — Patient Instructions (Addendum)
check your blood sugar twice a day.  vary the time of day when you check, between before the 3 meals, and at bedtime.  also check if you have symptoms of your blood sugar being too high or too low.  please keep a record of the readings and bring it to your next appointment here (or you can bring the meter itself).  You can write it on any piece of paper.  please call us sooner if your blood sugar goes below 70, or if you have a lot of readings over 200.   ?Please decrease the Lantus to 36 units each morning, and:  ?Please continue the same [place].   ?Please come back for a follow-up appointment in 3 months.   ? ? ?

## 2021-10-30 NOTE — Progress Notes (Signed)
? ?Subjective:  ? ? Patient ID: Jenna [name], female    DOB: 04-02-1946, 76 y.o.   MRN: [id number] ? ?HPI ?Pt returns for f/u of diabetes mellitus:  ?DM type: Insulin-requiring type 2.   ?Dx'ed: 2000 ?Complications: stage 3 CRI and PAD.   ?Therapy: insulin since 2022, and Farxiga.   ?GDM: never ?DKA: never ?Severe hypoglycemia: never ?Pancreatitis: never ?Pancreatic imaging: never ?SDOH: She declines to check cbg's.  She also declines personal CGM.   ?Other: she lost weight with XRT, but regained afterwards; she did not tolerate Rybelsus (abd pain).   ?Interval history: pt states fasting sweating, tremor, blurry vision, and anxiety.  Sxs resolve with PO intake.  She takes meds as rx'ed.  She does not have continuous glucose monitor device with her today.    ?Past Medical History:  ?Diagnosis Date  ? Blood transfusion without reported diagnosis   ? Cataract   ? DM (diabetes mellitus) ([place])   ? GERD (gastroesophageal reflux disease)   ? Hyperlipidemia   ? Hypertension   ? Iron deficiency anemia   ? NSCL ca dx'd 03/2018  ? Thyroid disease   ? ? ?Past Surgical History:  ?Procedure Laterality Date  ? CATARACT EXTRACTION Right   ? CHOLECYSTECTOMY    ? COLONOSCOPY  10/24/2009  ? normal rectum/1X1cm abnormal lesion in the ascending colon (bx benign). TI normal for 10cm.  Prep difficult/inadequate. f/u TCS 09/2012 recommended  ? COLONOSCOPY  10/13/2004  ? Normal rectum/Diminutive polyps, splenic flexure, cold biopsied/removed.  Remainder of colonic mucosa appeared normal.  ? COLONOSCOPY N/A 12/14/2012  ? PRX:YVOPFYT polyp-tubular adenoma  ? ESOPHAGOGASTRODUODENOSCOPY  10/13/2004  ?  Normal esophagus/ Nodular volcano like lesion in the antrum, either representing a  pancreatic rest or leiomyoma, biopsied.  Remainder of the gastric mucosa appeared normal, normal D1-D2  ? ESOPHAGOGASTRODUODENOSCOPY  10/24/2009  ? Benign biopsies. normal esophagus/small hiatal hernia/nodular lesion antrum/distal greater curvature. duodenal  AVM s/p ablation  ? GIVENS CAPSULE STUDY  07/27/2010  ?  multiple arteriovenous malformations which could definitely be the contributor to her drifting hemoglobin and hematocrit  ? IR THORACENTESIS ASP PLEURAL SPACE W/IMG GUIDE  12/16/2020  ? TUBAL LIGATION    ? VIDEO BRONCHOSCOPY WITH ENDOBRONCHIAL ULTRASOUND N/A 04/27/2018  ? Procedure: VIDEO BRONCHOSCOPY WITH ENDOBRONCHIAL ULTRASOUND;  Surgeon: Melrose Nakayama, MD;  Location: [place];  Service: Thoracic;  Laterality: N/A;  ? ? ?Social History  ? ?Socioeconomic History  ? Marital status: Single  ?  Spouse name: Not on file  ? Number of children: 3  ? Years of education: Not on file  ? Highest education level: Not on file  ?Occupational History  ? Occupation: retired  ?Tobacco Use  ? Smoking status: Former  ?  Packs/day: 0.50  ?  Years: 55.00  ?  Pack years: 27.50  ?  Types: Cigarettes  ?  Quit date: 03/18/2018  ?  Years since quitting: 3.6  ? Smokeless tobacco: Never  ? Tobacco comments:  ?  smoked off and n  ?Vaping Use  ? Vaping Use: Never used  ?Substance and Sexual Activity  ? Alcohol use: No  ? Drug use: No  ? Sexual activity: Not Currently  ?Other Topics Concern  ? Not on file  ?Social History Narrative  ? Patient fully vaccinated  ? ?Social Determinants of Health  ? ?Financial Resource Strain: Not on file  ?Food Insecurity: Not on file  ?Transportation Needs: Not on file  ?Physical Activity: Not on file  ?Stress:  Not on file  ?Social Connections: Not on file  ?Intimate Partner Violence: Not on file  ? ? ?Current Outpatient Medications on File Prior to Visit  ?Medication Sig Dispense Refill  ? albuterol (VENTOLIN HFA) 108 (90 Base) MCG/ACT inhaler Inhale 1-2 puffs into the lungs every 6 (six) hours as needed for wheezing or shortness of breath. 6.7 g 0  ? B-D UF III MINI PEN NEEDLES 31G X 5 MM MISC Inject into the skin every morning.    ? blood glucose meter kit and supplies KIT Use up to four times daily as directed 1 each 0  ? dapagliflozin propanediol  (FARXIGA) 10 MG TABS tablet Take 1 tablet (10 mg total) by mouth daily before breakfast. 90 tablet 3  ? DEXILANT 30 MG capsule Take 1 capsule (30 mg total) by mouth daily. 90 capsule 3  ? diphenhydrAMINE (BENADRYL) 2 % cream Apply 1 application topically as needed for itching.    ? docusate sodium (COLACE) 100 MG capsule Take 100 mg by mouth daily as needed for mild constipation.    ? Ferrous Sulfate (IRON) 325 (65 Fe) MG TABS Take 1 tablet (325 mg total) by mouth 2 (two) times daily with a meal. 180 tablet 0  ? fluticasone furoate-vilanterol (BREO ELLIPTA) 200-25 MCG/ACT AEPB Inhale 1 puff into the lungs daily. 30 each 3  ? labetalol (NORMODYNE) 100 MG tablet Take 0.5 tablets (50 mg total) by mouth 2 (two) times daily. 90 tablet 3  ? levothyroxine (SYNTHROID) 75 MCG tablet Take 1 tablet by mouth once daily 90 tablet 0  ? losartan (COZAAR) 50 MG tablet Take 1 tablet (50 mg total) by mouth daily. 90 tablet 3  ? Multiple Vitamin (MULTIVITAMIN WITH MINERALS) TABS tablet Take 1 tablet by mouth at bedtime.     ? pravastatin (PRAVACHOL) 20 MG tablet Take 1 tablet (20 mg total) by mouth daily. 90 tablet 3  ? ?No current facility-administered medications on file prior to visit.  ? ? ?Allergies  ?Allergen Reactions  ? Paclitaxel Other (See Comments)  ?  Unresponsiveness shortly after Taxol inf started 06/12/18.  ? Aspirin Other (See Comments)  ?  Stomach bleeding   ? Esomeprazole Magnesium   ?  UNSPECIFIED REACTION   ? Ace Inhibitors Other (See Comments)  ?  Dizziness, drunk like  ? ? ?Family History  ?Problem Relation Age of Onset  ? Diabetes Sister   ? Colon cancer Maternal Aunt   ?     greater than age 17  ? Breast cancer Cousin   ? Diabetes Daughter   ? Diabetes Daughter   ? Amblyopia Neg Hx   ? Blindness Neg Hx   ? Cataracts Neg Hx   ? Glaucoma Neg Hx   ? Macular degeneration Neg Hx   ? Retinal detachment Neg Hx   ? Strabismus Neg Hx   ? Retinitis pigmentosa Neg Hx   ? Rectal cancer Neg Hx   ? Stomach cancer Neg Hx   ?  Colon polyps Neg Hx   ? Esophageal cancer Neg Hx   ? ? ?BP (!) 148/70 (BP Location: Left Arm, Patient Position: Sitting, Cuff Size: Normal)   Pulse 93   Ht $R'5\' 5"'pN$  (1.651 m)   Wt 196 lb (88.9 kg)   SpO2 97%   BMI 32.62 kg/m?  ? ? ?Review of Systems ? ?   ?Objective:  ? Physical Exam ?VITAL SIGNS:  See vs page ?GENERAL: no distress ? ? ?A1c=6.8% ? ?   ?  Assessment & Plan:  ?Insulin-requiring type 2 DM: overcontrolled.  ? ? ?Patient Instructions  ?check your blood sugar twice a day.  vary the time of day when you check, between before the 3 meals, and at bedtime.  also check if you have symptoms of your blood sugar being too high or too low.  please keep a record of the readings and bring it to your next appointment here (or you can bring the meter itself).  You can write it on any piece of paper.  please call us sooner if your blood sugar goes below 70, or if you have a lot of readings over 200.   ?Please decrease the Lantus to 36 units each morning, and:  ?Please continue the same [place].   ?Please come back for a follow-up appointment in 3 months.   ? ? ? ? ?

## 2021-11-18 ENCOUNTER — Other Ambulatory Visit: Payer: Self-pay | Admitting: Endocrinology

## 2021-12-15 ENCOUNTER — Telehealth: Payer: Self-pay | Admitting: Internal Medicine

## 2021-12-15 NOTE — Telephone Encounter (Signed)
Noted  

## 2021-12-15 NOTE — Telephone Encounter (Signed)
Called patient to inform her that we needed to reschedule her AWV that was scheduled for tomorrow due to [facility] being out. ?Patient also has an appointment tomorrow with PCP at 10:00 and states that we set up transportation for these visits and we need to inform them that she won't need to be picked up so early since we are canceling the 9:15 appointment. ?Please call patient and let her know what time they will be picking her up once this is resolved, thank you. ? ?*Message forwwarded to [place]  ?

## 2021-12-15 NOTE — Telephone Encounter (Signed)
Pt states she was to receive a return call regarding transportation for her appt on 5-3 ? ?PT states she was informed that transportation was requested on her behalf but she does not know the time she is to be picked up ? ?Per message below, information regarding transportation was forward to a [employer] but there is not a number listed for them ? ?Pt requested to speak back w/ caller she spoke w/ earlier ? ?Call transfered ?

## 2021-12-16 ENCOUNTER — Ambulatory Visit (INDEPENDENT_AMBULATORY_CARE_PROVIDER_SITE_OTHER): Payer: Medicare Other | Admitting: Internal Medicine

## 2021-12-16 ENCOUNTER — Encounter: Payer: Self-pay | Admitting: Internal Medicine

## 2021-12-16 ENCOUNTER — Ambulatory Visit: Payer: Medicare Other

## 2021-12-16 VITALS — BP 126/82 | HR 95 | Resp 18 | Ht 65.0 in | Wt 196.8 lb

## 2021-12-16 DIAGNOSIS — E785 Hyperlipidemia, unspecified: Secondary | ICD-10-CM

## 2021-12-16 DIAGNOSIS — E1169 Type 2 diabetes mellitus with other specified complication: Secondary | ICD-10-CM

## 2021-12-16 DIAGNOSIS — D509 Iron deficiency anemia, unspecified: Secondary | ICD-10-CM | POA: Diagnosis not present

## 2021-12-16 DIAGNOSIS — E118 Type 2 diabetes mellitus with unspecified complications: Secondary | ICD-10-CM

## 2021-12-16 DIAGNOSIS — Z0001 Encounter for general adult medical examination with abnormal findings: Secondary | ICD-10-CM

## 2021-12-16 DIAGNOSIS — E1165 Type 2 diabetes mellitus with hyperglycemia: Secondary | ICD-10-CM

## 2021-12-16 DIAGNOSIS — R0602 Shortness of breath: Secondary | ICD-10-CM

## 2021-12-16 LAB — MICROALBUMIN / CREATININE URINE RATIO
Creatinine,U: 72.7 mg/dL
Microalb Creat Ratio: 29.6 mg/g (ref 0.0–30.0)
Microalb, Ur: 21.5 mg/dL — ABNORMAL HIGH (ref 0.0–1.9)

## 2021-12-16 LAB — COMPREHENSIVE METABOLIC PANEL
ALT: 34 U/L (ref 0–35)
AST: 34 U/L (ref 0–37)
Albumin: 4.1 g/dL (ref 3.5–5.2)
Alkaline Phosphatase: 109 U/L (ref 39–117)
BUN: 35 mg/dL — ABNORMAL HIGH (ref 6–23)
CO2: 22 mEq/L (ref 19–32)
Calcium: 9.8 mg/dL (ref 8.4–10.5)
Chloride: 106 mEq/L (ref 96–112)
Creatinine, Ser: 1.26 mg/dL — ABNORMAL HIGH (ref 0.40–1.20)
GFR: 41.75 mL/min — ABNORMAL LOW (ref >60.00)
Glucose, Bld: 137 mg/dL — ABNORMAL HIGH (ref 70–99)
Potassium: 4.4 mEq/L (ref 3.5–5.1)
Sodium: 137 mEq/L (ref 135–145)
Total Bilirubin: 0.3 mg/dL (ref 0.2–1.2)
Total Protein: 8.5 g/dL — ABNORMAL HIGH (ref 6.0–8.3)

## 2021-12-16 LAB — CBC
HCT: 34.8 % — ABNORMAL LOW (ref 36.0–46.0)
Hemoglobin: 11 g/dL — ABNORMAL LOW (ref 12.0–15.0)
MCHC: 31.5 g/dL (ref 30.0–36.0)
MCV: 83.7 fl (ref 78.0–100.0)
Platelets: 209 10*3/uL (ref 150.0–400.0)
RBC: 4.16 Mil/uL (ref 3.87–5.11)
RDW: 18 % — ABNORMAL HIGH (ref 11.5–15.5)
WBC: 6.2 10*3/uL (ref 4.0–10.5)

## 2021-12-16 LAB — LIPID PANEL
Cholesterol: 173 mg/dL (ref 0–200)
HDL: 47.9 mg/dL (ref >39.00)
LDL Cholesterol: 104 mg/dL — ABNORMAL HIGH (ref 0–99)
NonHDL: 124.64
Total CHOL/HDL Ratio: 4
Triglycerides: 105 mg/dL (ref 0.0–149.0)
VLDL: 21 mg/dL (ref 0.0–40.0)

## 2021-12-16 MED ORDER — DAPAGLIFLOZIN PROPANEDIOL 10 MG PO TABS: 10.0000 mg | ORAL_TABLET | Freq: Every day | ORAL | 3 refills | Status: DC

## 2021-12-16 NOTE — Progress Notes (Signed)
? ?Subjective:  ? ?Patient ID: Jenna [name], female    DOB: 11-Jul-1946, 76 y.o.   MRN: [id number] ? ?HPI ?Here for medicare wellness and physical, no new complaints. Please see A/P for status and treatment of chronic medical problems.  ? ?Diet: DM since diabetic ?Physical activity: sedentary ?Depression/mood screen: negative ?Hearing: intact to whispered voice ?Visual acuity: grossly normal, performs annual eye exam  ?ADLs: capable ?Fall risk: none ?Home safety: good ?Cognitive evaluation: intact to orientation, naming, recall and repetition ?EOL planning: adv directives discussed, not in place ? ?[place] Office Visit from 12/16/2021 in [facility] at [place]  ?PHQ-2 Total Score 0  ? ?  ?  ?[place] Office Visit from 12/16/2021 in [facility] at [place]  ?PHQ-9 Total Score 3  ? ?  ? ? ?  12/17/2020  ?  7:02 AM 01/06/2021  ? 11:50 AM 07/07/2021  ?  1:46 PM 09/01/2021  ? 11:48 AM 12/16/2021  ? 10:22 AM  ?Fall Risk  ?Falls in the past year?     0  ?Was there an injury with Fall?     0  ?Fall Risk Category Calculator     0  ?Fall Risk Category     Low  ?Patient Fall Risk Level Moderate fall risk Low fall risk Low fall risk Low fall risk   ? ? ?I have personally reviewed and have noted ?1. The patient's medical and social history - reviewed today no changes ?2. Their use of alcohol, tobacco or illicit drugs ?3. Their current medications and supplements ?4. The patient's functional ability including ADL's, fall risks, home safety risks and hearing or visual impairment. ?5. Diet and physical activities ?6. Evidence for depression or mood disorders ?7. Care team reviewed and updated ?8.  The patient is not on an opioid pain medication. ? ?Patient Care Team: ?Hoyt Koch, MD as PCP - General (Internal Medicine) ?Rourk, Cristopher Estimable, MD (Gastroenterology) ?Past Medical History:  ?Diagnosis Date  ? Blood transfusion without reported diagnosis   ? Cataract   ? DM (diabetes mellitus)  ([place])   ? GERD (gastroesophageal reflux disease)   ? Hyperlipidemia   ? Hypertension   ? Iron deficiency anemia   ? NSCL ca dx'd 03/2018  ? Thyroid disease   ? ?Past Surgical History:  ?Procedure Laterality Date  ? CATARACT EXTRACTION Right   ? CHOLECYSTECTOMY    ? COLONOSCOPY  10/24/2009  ? normal rectum/1X1cm abnormal lesion in the ascending colon (bx benign). TI normal for 10cm.  Prep difficult/inadequate. f/u TCS 09/2012 recommended  ? COLONOSCOPY  10/13/2004  ? Normal rectum/Diminutive polyps, splenic flexure, cold biopsied/removed.  Remainder of colonic mucosa appeared normal.  ? COLONOSCOPY N/A 12/14/2012  ? FYB:OFBPZWC polyp-tubular adenoma  ? ESOPHAGOGASTRODUODENOSCOPY  10/13/2004  ?  Normal esophagus/ Nodular volcano like lesion in the antrum, either representing a  pancreatic rest or leiomyoma, biopsied.  Remainder of the gastric mucosa appeared normal, normal D1-D2  ? ESOPHAGOGASTRODUODENOSCOPY  10/24/2009  ? Benign biopsies. normal esophagus/small hiatal hernia/nodular lesion antrum/distal greater curvature. duodenal AVM s/p ablation  ? GIVENS CAPSULE STUDY  07/27/2010  ?  multiple arteriovenous malformations which could definitely be the contributor to her drifting hemoglobin and hematocrit  ? IR THORACENTESIS ASP PLEURAL SPACE W/IMG GUIDE  12/16/2020  ? TUBAL LIGATION    ? VIDEO BRONCHOSCOPY WITH ENDOBRONCHIAL ULTRASOUND N/A 04/27/2018  ? Procedure: VIDEO BRONCHOSCOPY WITH ENDOBRONCHIAL ULTRASOUND;  Surgeon: Melrose Nakayama, MD;  Location: [facility];  Service:  Thoracic;  Laterality: N/A;  ? ?Family History  ?Problem Relation Age of Onset  ? Diabetes Sister   ? Colon cancer Maternal Aunt   ?     greater than age 77  ? Breast cancer Cousin   ? Diabetes Daughter   ? Diabetes Daughter   ? Amblyopia Neg Hx   ? Blindness Neg Hx   ? Cataracts Neg Hx   ? Glaucoma Neg Hx   ? Macular degeneration Neg Hx   ? Retinal detachment Neg Hx   ? Strabismus Neg Hx   ? Retinitis pigmentosa Neg Hx   ? Rectal cancer Neg Hx   ?  Stomach cancer Neg Hx   ? Colon polyps Neg Hx   ? Esophageal cancer Neg Hx   ? ?Review of Systems  ?Constitutional:  Positive for fatigue.  ?HENT: Negative.    ?Eyes: Negative.   ?Respiratory:  Positive for shortness of breath. Negative for cough and chest tightness.   ?Cardiovascular:  Negative for chest pain, palpitations and leg swelling.  ?Gastrointestinal:  Negative for abdominal distention, abdominal pain, constipation, diarrhea, nausea and vomiting.  ?Musculoskeletal: Negative.   ?Skin: Negative.   ?Neurological: Negative.   ?Psychiatric/Behavioral: Negative.    ? ?Objective:  ?Physical Exam ?Constitutional:   ?   Appearance: She is well-developed.  ?HENT:  ?   Head: Normocephalic and atraumatic.  ?Cardiovascular:  ?   Rate and Rhythm: Normal rate and regular rhythm.  ?Pulmonary:  ?   Effort: Pulmonary effort is normal. No respiratory distress.  ?   Breath sounds: Normal breath sounds. No wheezing or rales.  ?Abdominal:  ?   General: Bowel sounds are normal. There is no distension.  ?   Palpations: Abdomen is soft.  ?   Tenderness: There is no abdominal tenderness. There is no rebound.  ?Musculoskeletal:  ?   Cervical back: Normal range of motion.  ?Skin: ?   General: Skin is warm and dry.  ?Neurological:  ?   Mental Status: She is alert and oriented to person, place, and time.  ?   Coordination: Coordination normal.  ? ? ?Vitals:  ? 12/16/21 1017  ?BP: 126/82  ?Pulse: 95  ?Resp: 18  ?SpO2: 99%  ?Weight: 196 lb 12.8 oz (89.3 kg)  ?Height: 5\' 5"  (1.651 m)  ? ?This visit occurred during the SARS-CoV-2 public health emergency.  Safety protocols were in place, including screening questions prior to the visit, additional usage of staff PPE, and extensive cleaning of exam room while observing appropriate contact time as indicated for disinfecting solutions.  ? ?Assessment & Plan:  ? ?

## 2021-12-16 NOTE — Patient Instructions (Signed)
We will get the lung function test.  ? ? ?

## 2021-12-18 DIAGNOSIS — Z0001 Encounter for general adult medical examination with abnormal findings: Secondary | ICD-10-CM | POA: Insufficient documentation

## 2021-12-18 NOTE — Assessment & Plan Note (Signed)
Albuterol has not helped much and breo has not helped either. She may have more decrease in function from prior lung cancer treatment. Ordered PFTs today and will adjust as appropriate afterwards.  ?

## 2021-12-18 NOTE — Assessment & Plan Note (Signed)
Checking lipid panel and adjust pravastatin 20 mg daily as needed for LDL goal <100. ?

## 2021-12-18 NOTE — Assessment & Plan Note (Signed)
Flu shot yearly. Covid-19 counseled. Pneumonia declines. Shingrix declines. Tetanus declines. Colonoscopy due declines for now. Mammogram up to date, pap smear aged out and dexa complete. Counseled about sun safety and mole surveillance. Counseled about the dangers of distracted driving. Given 10 year screening recommendations.  ? ?

## 2021-12-18 NOTE — Assessment & Plan Note (Signed)
Needed refill on farxiga 10 mg daily which was done. Checking labs as due and this will save her a blood draw with endo. Also taking lantus 40 units daily. Checking microalbumin to creatinine ratio as well as lipid panel.  ?

## 2021-12-18 NOTE — Assessment & Plan Note (Signed)
Checking CBC to ensure stability and change treatment. Taking otc iron currently.  ?

## 2022-01-01 ENCOUNTER — Other Ambulatory Visit: Payer: Self-pay

## 2022-01-01 ENCOUNTER — Ambulatory Visit (HOSPITAL_COMMUNITY)
Admission: RE | Admit: 2022-01-01 | Discharge: 2022-01-01 | Disposition: A | Discharge status: Home / Self Care | Payer: Medicare Other | Source: Ambulatory Visit | Attending: Internal Medicine | Admitting: Internal Medicine

## 2022-01-01 ENCOUNTER — Inpatient Hospital Stay: Payer: Medicare Other | Attending: Internal Medicine

## 2022-01-01 DIAGNOSIS — Z9221 Personal history of antineoplastic chemotherapy: Secondary | ICD-10-CM | POA: Insufficient documentation

## 2022-01-01 DIAGNOSIS — Z85118 Personal history of other malignant neoplasm of bronchus and lung: Secondary | ICD-10-CM | POA: Diagnosis not present

## 2022-01-01 DIAGNOSIS — C342 Malignant neoplasm of middle lobe, bronchus or lung: Secondary | ICD-10-CM

## 2022-01-01 DIAGNOSIS — Z923 Personal history of irradiation: Secondary | ICD-10-CM | POA: Diagnosis not present

## 2022-01-01 DIAGNOSIS — C3490 Malignant neoplasm of unspecified part of unspecified bronchus or lung: Secondary | ICD-10-CM

## 2022-01-01 LAB — CMP (CANCER CENTER ONLY)
ALT: 19 U/L (ref 0–44)
AST: 29 U/L (ref 15–41)
Albumin: 3.9 g/dL (ref 3.5–5.0)
Alkaline Phosphatase: 90 U/L (ref 38–126)
Anion gap: 7 (ref 5–15)
BUN: 32 mg/dL — ABNORMAL HIGH (ref 8–23)
CO2: 23 mmol/L (ref 22–32)
Calcium: 9.1 mg/dL (ref 8.9–10.3)
Chloride: 109 mmol/L (ref 98–111)
Creatinine: 1.56 mg/dL — ABNORMAL HIGH (ref 0.44–1.00)
GFR, Estimated: 34 mL/min — ABNORMAL LOW (ref >60)
Glucose, Bld: 124 mg/dL — ABNORMAL HIGH (ref 70–99)
Potassium: 5 mmol/L (ref 3.5–5.1)
Sodium: 139 mmol/L (ref 135–145)
Total Bilirubin: 0.4 mg/dL (ref 0.3–1.2)
Total Protein: 8.3 g/dL — ABNORMAL HIGH (ref 6.5–8.1)

## 2022-01-01 LAB — CBC WITH DIFFERENTIAL (CANCER CENTER ONLY)
Abs Immature Granulocytes: 0.02 10*3/uL (ref 0.00–0.07)
Basophils Absolute: 0 10*3/uL (ref 0.0–0.1)
Basophils Relative: 0 %
Eosinophils Absolute: 0.1 10*3/uL (ref 0.0–0.5)
Eosinophils Relative: 2 %
HCT: 34.2 % — ABNORMAL LOW (ref 36.0–46.0)
Hemoglobin: 10.6 g/dL — ABNORMAL LOW (ref 12.0–15.0)
Immature Granulocytes: 0 %
Lymphocytes Relative: 9 %
Lymphs Abs: 0.5 10*3/uL — ABNORMAL LOW (ref 0.7–4.0)
MCH: 26.5 pg (ref 26.0–34.0)
MCHC: 31 g/dL (ref 30.0–36.0)
MCV: 85.5 fL (ref 80.0–100.0)
Monocytes Absolute: 0.4 10*3/uL (ref 0.1–1.0)
Monocytes Relative: 6 %
Neutro Abs: 4.8 10*3/uL (ref 1.7–7.7)
Neutrophils Relative %: 83 %
Platelet Count: 212 10*3/uL (ref 150–400)
RBC: 4 MIL/uL (ref 3.87–5.11)
RDW: 16.1 % — ABNORMAL HIGH (ref 11.5–15.5)
WBC Count: 5.9 10*3/uL (ref 4.0–10.5)
nRBC: 0 % (ref 0.0–0.2)

## 2022-01-04 ENCOUNTER — Ambulatory Visit (HOSPITAL_COMMUNITY)
Admission: RE | Admit: 2022-01-04 | Discharge: 2022-01-04 | Disposition: A | Discharge status: Home / Self Care | Payer: Medicare Other | Source: Ambulatory Visit | Attending: Internal Medicine | Admitting: Internal Medicine

## 2022-01-04 DIAGNOSIS — R911 Solitary pulmonary nodule: Secondary | ICD-10-CM | POA: Diagnosis not present

## 2022-01-04 DIAGNOSIS — J439 Emphysema, unspecified: Secondary | ICD-10-CM | POA: Diagnosis not present

## 2022-01-04 DIAGNOSIS — R918 Other nonspecific abnormal finding of lung field: Secondary | ICD-10-CM | POA: Diagnosis not present

## 2022-01-04 DIAGNOSIS — C3490 Malignant neoplasm of unspecified part of unspecified bronchus or lung: Secondary | ICD-10-CM | POA: Diagnosis not present

## 2022-01-04 DIAGNOSIS — J90 Pleural effusion, not elsewhere classified: Secondary | ICD-10-CM | POA: Diagnosis not present

## 2022-01-05 ENCOUNTER — Inpatient Hospital Stay: Payer: Medicare Other

## 2022-01-05 ENCOUNTER — Inpatient Hospital Stay (HOSPITAL_BASED_OUTPATIENT_CLINIC_OR_DEPARTMENT_OTHER): Payer: Medicare Other | Admitting: Internal Medicine

## 2022-01-05 VITALS — BP 156/62 | HR 88 | Temp 97.7°F | Resp 17 | Ht 65.0 in | Wt 194.6 lb

## 2022-01-05 DIAGNOSIS — Z9221 Personal history of antineoplastic chemotherapy: Secondary | ICD-10-CM | POA: Diagnosis not present

## 2022-01-05 DIAGNOSIS — Z923 Personal history of irradiation: Secondary | ICD-10-CM | POA: Diagnosis not present

## 2022-01-05 DIAGNOSIS — C3490 Malignant neoplasm of unspecified part of unspecified bronchus or lung: Secondary | ICD-10-CM

## 2022-01-05 DIAGNOSIS — Z85118 Personal history of other malignant neoplasm of bronchus and lung: Secondary | ICD-10-CM | POA: Diagnosis not present

## 2022-01-05 NOTE — Progress Notes (Signed)
[facility] [phone]   Fax:([phone]  OFFICE PROGRESS NOTE  Jenna Koch, MD [place] [address]  DIAGNOSIS: Stage IIIB (T3, N3, M0) non-small cell lung cancer, squamous cell carcinoma presented with large right middle lobe lung breast in addition to mediastinal and bilateral hilar lymphadenopathy and suspicious right supraclavicular lymph node diagnosed in August 2019.    PRIOR THERAPY:  1) A course of concurrent chemoradiation with weekly carboplatin for AUC of 2 and paclitaxel 45 MG/M2.  First dose of chemotherapy given on 05/29/2018.  Status post 5 cycles. 2) Consolidation treatment with immunotherapy with Imfinzi 10 mg/KG every 2 weeks.  First dose August 10, 2018.  Status post 26 cycles.   CURRENT THERAPY:  Observation.  INTERVAL HISTORY: Jenna [name] 76 y.o. female returns to the clinic today for 6 months follow-up visit.  The patient is feeling fine today with no concerning complaints except for shortness of breath with exertion.  She denied having any chest pain, cough or hemoptysis.  She has no nausea, vomiting, diarrhea or constipation.  She has no headache or visual changes.  She has been in observation now for more than 2 years.  She is here for evaluation with repeat CT scan of the chest for restaging of her disease.  MEDICAL HISTORY: Past Medical History:  Diagnosis Date   Blood transfusion without reported diagnosis    Cataract    DM (diabetes mellitus) ([place])    GERD (gastroesophageal reflux disease)    Hyperlipidemia    Hypertension    Iron deficiency anemia    NSCL ca dx'd 03/2018   Thyroid disease     ALLERGIES:  is allergic to paclitaxel, aspirin, esomeprazole magnesium, and ace inhibitors.  MEDICATIONS:  Current Outpatient Medications  Medication Sig Dispense Refill   albuterol (VENTOLIN HFA) 108 (90 Base) MCG/ACT inhaler Inhale 1-2 puffs into the lungs every 6 (six) hours as  needed for wheezing or shortness of breath. (Patient not taking: Reported on 12/16/2021) 6.7 g 0   B-D UF III MINI PEN NEEDLES 31G X 5 MM MISC Inject into the skin every morning.     blood glucose meter kit and supplies KIT Use up to four times daily as directed 1 each 0   dapagliflozin propanediol (FARXIGA) 10 MG TABS tablet Take 1 tablet (10 mg total) by mouth daily before breakfast. 90 tablet 3   DEXILANT 30 MG capsule Take 1 capsule (30 mg total) by mouth daily. 90 capsule 3   diphenhydrAMINE (BENADRYL) 2 % cream Apply 1 application topically as needed for itching.     docusate sodium (COLACE) 100 MG capsule Take 100 mg by mouth daily as needed for mild constipation.     Ferrous Sulfate (IRON) 325 (65 Fe) MG TABS Take 1 tablet (325 mg total) by mouth 2 (two) times daily with a meal. 180 tablet 0   fluticasone furoate-vilanterol (BREO ELLIPTA) 200-25 MCG/ACT AEPB Inhale 1 puff into the lungs daily. 30 each 3   insulin glargine (LANTUS SOLOSTAR) 100 UNIT/ML Solostar Pen Inject 36 Units into the skin every morning. And 31G, 27mm pen needles 1/day 45 mL PRN   labetalol (NORMODYNE) 100 MG tablet Take 0.5 tablets (50 mg total) by mouth 2 (two) times daily. 90 tablet 3   levothyroxine (SYNTHROID) 75 MCG tablet Take 1 tablet by mouth once daily 90 tablet 3   losartan (COZAAR) 50 MG tablet Take 1 tablet (50 mg total) by  mouth daily. 90 tablet 3   Multiple Vitamin (MULTIVITAMIN WITH MINERALS) TABS tablet Take 1 tablet by mouth at bedtime.      pravastatin (PRAVACHOL) 20 MG tablet Take 1 tablet (20 mg total) by mouth daily. 90 tablet 3   No current facility-administered medications for this visit.    SURGICAL HISTORY:  Past Surgical History:  Procedure Laterality Date   CATARACT EXTRACTION Right    CHOLECYSTECTOMY     COLONOSCOPY  10/24/2009   normal rectum/1X1cm abnormal lesion in the ascending colon (bx benign). TI normal for 10cm.  Prep difficult/inadequate. f/u TCS 09/2012 recommended   COLONOSCOPY   10/13/2004   Normal rectum/Diminutive polyps, splenic flexure, cold biopsied/removed.  Remainder of colonic mucosa appeared normal.   COLONOSCOPY N/A 12/14/2012   SWF:UXNATFT polyp-tubular adenoma   ESOPHAGOGASTRODUODENOSCOPY  10/13/2004    Normal esophagus/ Nodular volcano like lesion in the antrum, either representing a  pancreatic rest or leiomyoma, biopsied.  Remainder of the gastric mucosa appeared normal, normal D1-D2   ESOPHAGOGASTRODUODENOSCOPY  10/24/2009   Benign biopsies. normal esophagus/small hiatal hernia/nodular lesion antrum/distal greater curvature. duodenal AVM s/p ablation   GIVENS CAPSULE STUDY  07/27/2010    multiple arteriovenous malformations which could definitely be the contributor to her drifting hemoglobin and hematocrit   IR THORACENTESIS ASP PLEURAL SPACE W/IMG GUIDE  12/16/2020   TUBAL LIGATION     VIDEO BRONCHOSCOPY WITH ENDOBRONCHIAL ULTRASOUND N/A 04/27/2018   Procedure: VIDEO BRONCHOSCOPY WITH ENDOBRONCHIAL ULTRASOUND;  Surgeon: Melrose Nakayama, MD;  Location: [facility];  Service: Thoracic;  Laterality: N/A;    REVIEW OF SYSTEMS:  A comprehensive review of systems was negative except for: Respiratory: positive for dyspnea on exertion   PHYSICAL EXAMINATION: General appearance: alert, cooperative, fatigued, and no distress Head: Normocephalic, without obvious abnormality, atraumatic Neck: no adenopathy, no JVD, supple, symmetrical, trachea midline, and thyroid not enlarged, symmetric, no tenderness/mass/nodules Lymph nodes: Cervical, supraclavicular, and axillary nodes normal. Resp: clear to auscultation bilaterally Back: symmetric, no curvature. ROM normal. No CVA tenderness. Cardio: regular rate and rhythm, S1, S2 normal, no murmur, click, rub or gallop GI: soft, non-tender; bowel sounds normal; no masses,  no organomegaly Extremities: extremities normal, atraumatic, no cyanosis or edema  ECOG PERFORMANCE STATUS: 1 - Symptomatic but completely  ambulatory  Blood pressure (!) 156/62, pulse 88, temperature 97.7 F (36.5 C), temperature source Axillary, resp. rate 17, height $RemoveBe'5\' 5"'xioFdQUBG$  (1.651 m), weight 194 lb 9.6 oz (88.3 kg), SpO2 99 %.  LABORATORY DATA: Lab Results  Component Value Date   WBC 5.9 01/01/2022   HGB 10.6 (L) 01/01/2022   HCT 34.2 (L) 01/01/2022   MCV 85.5 01/01/2022   PLT 212 01/01/2022      Chemistry      Component Value Date/Time   NA 139 01/01/2022 1524   K 5.0 01/01/2022 1524   CL 109 01/01/2022 1524   CO2 23 01/01/2022 1524   BUN 32 (H) 01/01/2022 1524   BUN 24 (A) 02/12/2020 0000   CREATININE 1.56 (H) 01/01/2022 1524   CREATININE 0.72 12/10/2011 0919      Component Value Date/Time   CALCIUM 9.1 01/01/2022 1524   ALKPHOS 90 01/01/2022 1524   ALKPHOS 88 12/10/2011 0919   AST 29 01/01/2022 1524   ALT 19 01/01/2022 1524   BILITOT 0.4 01/01/2022 1524       RADIOGRAPHIC STUDIES: CT Chest Wo Contrast  Result Date: 01/05/2022 CLINICAL DATA:  Non-small cell lung cancer, restaging. * Tracking Code: BO * EXAM: CT CHEST  WITHOUT CONTRAST TECHNIQUE: Multidetector CT imaging of the chest was performed following the standard protocol without IV contrast. RADIATION DOSE REDUCTION: This exam was performed according to the departmental dose-optimization program which includes automated exposure control, adjustment of the mA and/or kV according to patient size and/or use of iterative reconstruction technique. COMPARISON:  Multiple priors including most recent CT July 06, 2021 FINDINGS: Cardiovascular: Aortic atherosclerosis without aneurysmal dilation. Coronary artery calcifications. Normal size heart. Trace pericardial effusion is similar prior within physiologic normal limits. Mediastinum/Nodes: Prominent mediastinal lymph nodes are unchanged and not pathologically enlarged by size criteria. No pathologically enlarged mediastinal, hilar or axillary lymph nodes, noting limited sensitivity for the detection of hilar  adenopathy on this noncontrast study. The esophagus is grossly unremarkable. Lungs/Pleura: Similar post treatment/post radiation appearance of the right chest with dense masslike consolidation and fibrosis of the perihilar right lung. Similar size of the loculated moderate right pleural effusion. Scattered irregular and ground-glass opacities throughout the left lung base are similar prior examination. Few small pulmonary nodules in the left lung are stable and almost certainly benign for reference a 4 mm nodule in the peripheral left upper lobe on image 56/7. Mild centrilobular and paraseptal emphysema. Upper Abdomen: Gallbladder surgically absent.  No acute abnormality. Musculoskeletal: No aggressive lytic or blastic lesion of bone. Post radiation change in the thoracic spine. Similar irregularity of the sternum likely reflecting sequela of prior trauma/post radiation. IMPRESSION: 1. Similar post treatment/post radiation appearance of the right chest with dense masslike consolidation and fibrosis of the perihilar right lung. As well, similar size of the loculated moderate right pleural effusion. 2. Scattered irregular and ground-glass opacities throughout the left lung base are similar prior examination and are most consistent with a nonspecific infectious or inflammatory process. Continued attention on follow-up imaging suggested. 3. A few small nodules in the left lung are stable and almost certainly benign incidental sequela of prior infection or inflammation 4. Aortic Atherosclerosis (ICD10-I70.0) and Emphysema (ICD10-J43.9). Electronically Signed   By: Dahlia Bailiff M.D.   On: 01/05/2022 10:36     ASSESSMENT AND PLAN: This is a very pleasant 76 years old African-American female recently diagnosed with a stage IIIB non-small cell lung cancer, squamous cell carcinoma.  She completed a course of concurrent chemoradiation with weekly carboplatin and paclitaxel status post 5 cycles with partial response.  She  tolerated this treatment well except for the pancytopenia and fatigue.  The patient completed a course of treatment with consolidation immunotherapy with Imfinzi status post 26 cycles.  She tolerated her treatment well with no concerning adverse effects. The patient has been on observation for few years and she is feeling fine with no concerning complaints except for the baseline shortness of breath. She had repeat CT scan of the chest performed recently.  I personally and independently reviewed the scans and discussed the result with the patient today. Her scan showed no concerning findings for disease progression. I recommended for her to continue on observation with repeat CT scan of the chest in 6 months. She was advised to call immediately if she has any other concerning symptoms in the interval. The patient voices understanding of current disease status and treatment options and is in agreement with the current care plan. All questions were answered. The patient knows to call the clinic with any problems, questions or concerns. We can certainly see the patient much sooner if necessary.  Disclaimer: This note was dictated with voice recognition software. Similar sounding words can inadvertently be transcribed  and may not be corrected upon review.

## 2022-01-14 ENCOUNTER — Encounter: Payer: Self-pay | Admitting: Gastroenterology

## 2022-02-04 ENCOUNTER — Other Ambulatory Visit: Payer: Self-pay | Admitting: Physician Assistant

## 2022-02-10 ENCOUNTER — Telehealth: Payer: Self-pay | Admitting: Internal Medicine

## 2022-02-10 NOTE — Telephone Encounter (Signed)
Yes referrals should be able to help

## 2022-02-10 NOTE — Telephone Encounter (Signed)
Pt called requesting an update on her referral for the Pulmonary Function test she said was ordered in may.   Please advise.  [phone]

## 2022-03-11 ENCOUNTER — Other Ambulatory Visit: Payer: Self-pay | Admitting: Internal Medicine

## 2022-03-11 DIAGNOSIS — Z1231 Encounter for screening mammogram for malignant neoplasm of breast: Secondary | ICD-10-CM

## 2022-03-12 ENCOUNTER — Telehealth: Payer: Self-pay | Admitting: Internal Medicine

## 2022-03-12 NOTE — Telephone Encounter (Signed)
Called patient regarding November appointment, patient is notified and calender will be mailed for a reminder.

## 2022-03-15 ENCOUNTER — Ambulatory Visit (INDEPENDENT_AMBULATORY_CARE_PROVIDER_SITE_OTHER): Payer: Medicare Other | Admitting: Internal Medicine

## 2022-03-15 DIAGNOSIS — R0602 Shortness of breath: Secondary | ICD-10-CM

## 2022-03-15 LAB — PULMONARY FUNCTION TEST
DL/VA % pred: 64 %
DL/VA: 2.62 ml/min/mmHg/L
DLCO cor % pred: 37 %
DLCO cor: 7.37 ml/min/mmHg
DLCO unc % pred: 37 %
DLCO unc: 7.37 ml/min/mmHg
FEF 25-75 Post: 0.63 L/sec
FEF 25-75 Pre: 0.88 L/sec
FEF2575-%Change-Post: -27 %
FEF2575-%Pred-Post: 36 %
FEF2575-%Pred-Pre: 51 %
FEV1-%Change-Post: -7 %
FEV1-%Pred-Post: 41 %
FEV1-%Pred-Pre: 44 %
FEV1-Post: 0.91 L
FEV1-Pre: 0.98 L
FEV1FVC-%Change-Post: 0 %
FEV1FVC-%Pred-Pre: 106 %
FEV6-%Change-Post: -7 %
FEV6-%Pred-Post: 40 %
FEV6-%Pred-Pre: 44 %
FEV6-Post: 1.13 L
FEV6-Pre: 1.23 L
FEV6FVC-%Pred-Post: 105 %
FEV6FVC-%Pred-Pre: 105 %
FVC-%Change-Post: -7 %
FVC-%Pred-Post: 38 %
FVC-%Pred-Pre: 41 %
FVC-Post: 1.13 L
FVC-Pre: 1.23 L
Post FEV1/FVC ratio: 80 %
Post FEV6/FVC ratio: 100 %
Pre FEV1/FVC ratio: 80 %
Pre FEV6/FVC Ratio: 100 %
RV % pred: 88 %
RV: 2.08 L
TLC % pred: 69 %
TLC: 3.62 L

## 2022-03-15 NOTE — Patient Instructions (Signed)
Full PFT Performed Today  

## 2022-03-15 NOTE — Progress Notes (Signed)
Full PFT Performed Today  

## 2022-03-16 ENCOUNTER — Other Ambulatory Visit: Payer: Self-pay | Admitting: Internal Medicine

## 2022-03-16 DIAGNOSIS — R0602 Shortness of breath: Secondary | ICD-10-CM

## 2022-04-05 ENCOUNTER — Encounter: Payer: Self-pay | Admitting: Internal Medicine

## 2022-04-05 ENCOUNTER — Ambulatory Visit (INDEPENDENT_AMBULATORY_CARE_PROVIDER_SITE_OTHER): Payer: Medicare Other | Admitting: Internal Medicine

## 2022-04-05 VITALS — BP 138/74 | HR 83 | Temp 99.2°F | Ht 65.0 in | Wt 199.0 lb

## 2022-04-05 DIAGNOSIS — J701 Chronic and other pulmonary manifestations due to radiation: Secondary | ICD-10-CM

## 2022-04-05 DIAGNOSIS — C3481 Malignant neoplasm of overlapping sites of right bronchus and lung: Secondary | ICD-10-CM | POA: Diagnosis not present

## 2022-04-05 DIAGNOSIS — R0602 Shortness of breath: Secondary | ICD-10-CM

## 2022-04-05 MED ORDER — FAMOTIDINE 20 MG PO TABS: 20.0000 mg | ORAL_TABLET | Freq: Two times a day (BID) | ORAL | 3 refills | Status: DC

## 2022-04-05 NOTE — Patient Instructions (Addendum)
Please schedule follow up scheduled with APP in 3 months.  If my schedule is not open yet, we will contact you with a reminder closer to that time. Please call [phone] if you haven't heard from [place] a month before.   Before your next visit I would like you to have:  CT scan which you will have done with Dr. Earlie Server. I will see you after you see Dr. Earlie Server.  Echocardiogram of your heart - Will call you with results.   I think you are short of breath because of scarring in your lungs from radiation related changes. My advice is to keep up your activity as much as possible.

## 2022-04-05 NOTE — Progress Notes (Signed)
Jenna [name]    [id number]    Dec 18, 1945  Primary Care Physician:Crawford, Real Cons, MD  Referring Physician: Hoyt Koch, MD [address] [place],  [place] [address] Reason for Consultation: shortness of breath Date of Consultation: 04/05/2022  Chief complaint:   Chief Complaint  Patient presents with   Consult    SOB with activity, cough, wheezing      HPI: Jenna [name]  is a 76 y.o. who presents for new patient evaluation of shortness of breath. Symptoms have been going on since she had radiation in 2019.  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 Now being managed by Dr. Earlie [name] with scans every 6 months.   She has had pleural effusion requiring thoracentesis x 2 in 2019 and last in May 2022.   She has dyspnea which limits her daily activities. She can go grocery shopping, but has transportation to bring herself. She gets short of breath such as ADLs such as taking a shower. This has worsened over the last year.   She has been prescribed breo in the past but it didn't help so she didn't keep taking it. She has an albuterol rescue inhaler and is unclear if that helps.   She coughs occasionally with yellow mucus. This is waking her up at night. She frequently clears her throat. She is on dexilant already for reflux.     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  Exposures: lives at home alone. Needs help with ADLs. No pets.  Smoking history: former smoker, quit in 2019.   Social History   Occupational History   Occupation: retired  Tobacco Use   Smoking status: Former    Packs/day: 0.50    Years: 55.00    Total pack years: 27.50    Types: Cigarettes    Quit date: 03/18/2018    Years since quitting: 4.0   Smokeless tobacco: Never   Tobacco comments:    smoked off and n  Vaping Use   Vaping Use: Never used  Substance and Sexual Activity    Alcohol use: No   Drug use: No   Sexual activity: Not Currently    Relevant family history:  Family History  Problem Relation Age of Onset   Diabetes Sister    Colon cancer Maternal Aunt        greater than age 26   Breast cancer Cousin    Diabetes Daughter    Diabetes Daughter    Amblyopia Neg Hx    Blindness Neg Hx    Cataracts Neg Hx    Glaucoma Neg Hx    Macular degeneration Neg Hx    Retinal detachment Neg Hx    Strabismus Neg Hx    Retinitis pigmentosa Neg Hx    Rectal cancer Neg Hx    Stomach cancer Neg Hx    Colon polyps Neg Hx    Esophageal cancer Neg Hx     Past Medical History:  Diagnosis Date   Blood transfusion without reported diagnosis    Cataract    DM (diabetes mellitus) ([place])    GERD (gastroesophageal reflux disease)    Hyperlipidemia    Hypertension    Iron deficiency anemia    NSCL ca dx'd 03/2018   Thyroid disease     Past Surgical History:  Procedure Laterality Date   CATARACT EXTRACTION Right    CHOLECYSTECTOMY  COLONOSCOPY  10/24/2009   normal rectum/1X1cm abnormal lesion in the ascending colon (bx benign). TI normal for 10cm.  Prep difficult/inadequate. f/u TCS 09/2012 recommended   COLONOSCOPY  10/13/2004   Normal rectum/Diminutive polyps, splenic flexure, cold biopsied/removed.  Remainder of colonic mucosa appeared normal.   COLONOSCOPY N/A 12/14/2012   BMW:UXLKGMW polyp-tubular adenoma   ESOPHAGOGASTRODUODENOSCOPY  10/13/2004    Normal esophagus/ Nodular volcano like lesion in the antrum, either representing a  pancreatic rest or leiomyoma, biopsied.  Remainder of the gastric mucosa appeared normal, normal D1-D2   ESOPHAGOGASTRODUODENOSCOPY  10/24/2009   Benign biopsies. normal esophagus/small hiatal hernia/nodular lesion antrum/distal greater curvature. duodenal AVM s/p ablation   GIVENS CAPSULE STUDY  07/27/2010    multiple arteriovenous malformations which could definitely be the contributor to her drifting hemoglobin and hematocrit    IR THORACENTESIS ASP PLEURAL SPACE W/IMG GUIDE  12/16/2020   TUBAL LIGATION     VIDEO BRONCHOSCOPY WITH ENDOBRONCHIAL ULTRASOUND N/A 04/27/2018   Procedure: VIDEO BRONCHOSCOPY WITH ENDOBRONCHIAL ULTRASOUND;  Surgeon: Melrose Nakayama, MD;  Location: [facility] OR;  Service: Thoracic;  Laterality: N/A;     Physical Exam: Blood pressure 138/74, pulse 83, temperature 99.2 F (37.3 C), temperature source Oral, height 5\' 5"  (1.651 m), weight 199 lb 0.6 oz (90.3 kg), SpO2 97 %. Gen:      No acute distress ENT:  no nasal polyps, mucus membranes moist Lungs:    No increased respiratory effort, symmetric chest wall excursion, clear to auscultation bilaterally, no wheezes or crackles CV:         Regular rate and rhythm; no murmurs, rubs, or gallops.  No pedal edema Abd:      + bowel sounds; soft, non-tender; obese MSK: no acute synovitis of DIP or PIP joints, no mechanics hands.  Skin:      Warm and dry; no rashes Neuro: normal speech, no focal facial asymmetry Psych: alert and oriented x3, normal mood and affect   Data Reviewed/Medical Decision Making:  Independent interpretation of tests: Imaging:  Review of patient's CT Chest May 2023 images revealed right hilar mass like consolidation with associated RLL atelectasis. The patient's images have been independently reviewed by me.   Right pleural space ultrasound done 04/05/22 shows atelectatic lung, no evidence of pleural effusion    PFTs: I have personally reviewed the patient's PFTs and in July 2023 show severe restriction to ventilation with reduced diffusion capacity.     Latest Ref Rng & Units 03/15/2022   10:43 AM 05/11/2018    2:33 PM  PFT Results  FVC-Pre L 1.23    FVC-Predicted Pre % 41  71   FVC-Post L 1.13  1.77   FVC-Predicted Post % 38  73   Pre FEV1/FVC % % 80  83   Post FEV1/FCV % % 80  82   FEV1-Pre L 0.98  1.44   FEV1-Predicted Pre % 44  76   FEV1-Post L 0.91  1.46   DLCO uncorrected ml/min/mmHg 7.37  10.35   DLCO  [facility]% % 37  40   DLCO corrected ml/min/mmHg 7.37  12.65   DLCO COR %Predicted % 37  49   DLVA Predicted % 64  79   TLC L 3.62  4.30   TLC % Predicted % 69  82   RV % Predicted % 88  104     Labs:  Lab Results  Component Value Date   WBC 5.9 01/01/2022   HGB 10.6 (L) 01/01/2022   HCT 34.2 (  L) 01/01/2022   MCV 85.5 01/01/2022   PLT 212 01/01/2022   Lab Results  Component Value Date   NA 139 01/01/2022   K 5.0 01/01/2022   CL 109 01/01/2022   CO2 23 01/01/2022     Immunization status:  Immunization History  Administered Date(s) Administered   Fluad Quad(high Dose 65+) 05/31/2019, 09/12/2020   Influenza-Unspecified 04/16/2014   PFIZER(Purple Top)SARS-COV-2 Vaccination 11/22/2019, 12/17/2019   Pneumococcal Conjugate-13 06/10/2014     I reviewed prior external note(s) from oncology, pcp, ED visit  I reviewed the result(s) of the labs and imaging as noted above.   I have ordered ultrasound, medication   Discussion of management or test interpretation with another colleague.   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 Previously her echo in 2021 was ok except some mild MR. Can repeat the echo as she is having some LE edema L>R.  I did image her right pleural space an there is no evidence of effusion. Her previous pleural studies show bland effusion with negative cytology in the past.   She is already on dexilant for reflux. Can trial adding pepcid to this to see if it helps her gerd. I suspect this is largely mechanical due to body habitus.   We'll see her back after she sees Dr. Earlie [name] and has her repeat scan.   We  discussed disease management and progression at length today.    Return to Care: Return in about 3 months (around 07/06/2022).  Lenice Llamas, MD Pulmonary and [facility] [employer]  CC: Jenna [name], *

## 2022-04-12 ENCOUNTER — Ambulatory Visit (HOSPITAL_COMMUNITY)
Admission: RE | Admit: 2022-04-12 | Discharge: 2022-04-12 | Disposition: A | Discharge status: Home / Self Care | Payer: Medicare Other | Source: Ambulatory Visit | Attending: Internal Medicine | Admitting: Internal Medicine

## 2022-04-12 DIAGNOSIS — I351 Nonrheumatic aortic (valve) insufficiency: Secondary | ICD-10-CM | POA: Diagnosis not present

## 2022-04-12 DIAGNOSIS — I10 Essential (primary) hypertension: Secondary | ICD-10-CM | POA: Insufficient documentation

## 2022-04-12 DIAGNOSIS — E119 Type 2 diabetes mellitus without complications: Secondary | ICD-10-CM | POA: Diagnosis not present

## 2022-04-12 DIAGNOSIS — K219 Gastro-esophageal reflux disease without esophagitis: Secondary | ICD-10-CM | POA: Diagnosis not present

## 2022-04-12 DIAGNOSIS — R0602 Shortness of breath: Secondary | ICD-10-CM | POA: Insufficient documentation

## 2022-04-12 DIAGNOSIS — R0600 Dyspnea, unspecified: Secondary | ICD-10-CM | POA: Diagnosis not present

## 2022-04-12 DIAGNOSIS — E785 Hyperlipidemia, unspecified: Secondary | ICD-10-CM | POA: Diagnosis not present

## 2022-04-12 LAB — ECHOCARDIOGRAM COMPLETE
Area-P 1/2: 4.89 cm2
Calc EF: 62.4 %
P 1/2 time: 265 msec
S' Lateral: 2.9 cm
Single Plane A2C EF: 63.6 %
Single Plane A4C EF: 61.2 %

## 2022-04-12 NOTE — Progress Notes (Signed)
Echocardiogram 2D Echocardiogram has been performed.  Fidel Levy 04/12/2022, 11:00 AM

## 2022-04-14 ENCOUNTER — Ambulatory Visit (INDEPENDENT_AMBULATORY_CARE_PROVIDER_SITE_OTHER): Payer: Medicare Other | Admitting: Physician Assistant

## 2022-04-14 ENCOUNTER — Ambulatory Visit: Payer: Medicare Other | Admitting: Physician Assistant

## 2022-04-14 ENCOUNTER — Encounter: Payer: Self-pay | Admitting: Physician Assistant

## 2022-04-14 VITALS — BP 114/62 | HR 84 | Ht 65.0 in | Wt 197.0 lb

## 2022-04-14 DIAGNOSIS — R0600 Dyspnea, unspecified: Secondary | ICD-10-CM

## 2022-04-14 DIAGNOSIS — Z86010 Personal history of colonic polyps: Secondary | ICD-10-CM | POA: Diagnosis not present

## 2022-04-14 MED ORDER — NA SULFATE-K SULFATE-MG SULF 17.5-3.13-1.6 GM/177ML PO SOLN
1.0000 | Freq: Once | ORAL | 0 refills | Status: AC
Start: 2022-04-14 — End: 2022-04-14

## 2022-04-14 NOTE — Patient Instructions (Signed)
_______________________________________________________  If you are age 76 or older, your body mass index should be between 23-30. Your Body mass index is 32.78 kg/m. If this is out of the aforementioned range listed, please consider follow up with your Primary Care Provider.  If you are age 72 or younger, your body mass index should be between 19-25. Your Body mass index is 32.78 kg/m. If this is out of the aformentioned range listed, please consider follow up with your Primary Care Provider.   ________________________________________________________  The [facility] providers would like to encourage you to use [facility] to communicate with providers for non-urgent requests or questions.  Due to long hold times on the telephone, sending your provider a message by [facility] may be a faster and more efficient way to get a response.  Please allow 48 business hours for a response.  Please remember that this is for non-urgent requests.  _______________________________________________________  Dennis Bast have been scheduled for a colonoscopy. Please follow written instructions given to you at your visit today.  Please pick up your prep supplies at the pharmacy within the next 1-3 days. If you use inhalers (even only as needed), please bring them with you on the day of your procedure.   We will get pulmonary clearance before your procedure.   It was a pleasure to see you today!  Thank you for trusting me with your gastrointestinal care!

## 2022-04-14 NOTE — Progress Notes (Signed)
Agree with assessment and plan as outlined.  Completed treatment for lung CA dx in 2019 and doing okay from that regard. If she wishes to have one last colonoscopy prior to stopping further surveillance and she is otherwise stable, then I think okay to proceed with the exam if this is her preference.

## 2022-04-14 NOTE — Progress Notes (Signed)
Chief Complaint: Discuss colonoscopy  HPI:    Jenna [name] is a 76 year old African-American female with a past medical history as listed below including history of lung cancer status post therapy with chemo/radiation and immunotherapy, iron deficiency anemia, diabetes and reflux, known to Dr. Havery Moros, who presents to clinic today to discuss colonoscopy.    12/19/2018 colonoscopy as below with AVMs and tubular adenomas and repeat recommended in 3 years.    06/17/2021 patient seen in clinic and at that time was there for refills.  She was doing well on generic Dexilant 30 mg once a day and had no trouble with reflux.  At that time discussed iron deficiency anemia for which the patient was chronically on iron supplementation with a history of small bowel and colonic AVMs.  GERD was controlled on Dexlansoprazole 30 mg daily which was refilled.  We rechecked a CBC and iron studies that day.    01/01/2022 CBC with hemoglobin stable at 10.6.  CMP with a creatinine of 1.56 a little higher than patient's baseline.    04/12/2022 echocardiogram for shortness of breath with LVEF 60-65%.    Today, the patient presents to clinic and tells me that she is doing well.  Describes that she only has trouble with her breathing if she walks a long distance.  Tells me she is following with pulmonology about this and they think it is from her radiation.  She is not on oxygen.  Otherwise describes some swelling in her ankles recently which is better since she has been avoiding salty foods.  Does tell me that occasionally when she is eating she feels like food sits in her stomach for a long time this is typically large pieces of meat like steak or chicken.  Denies any nausea, vomiting or abdominal pain.    Denies fever, chills or weight loss.  Prior workup: Colonoscopy 12/14/2012 - Dr. Gala Romney - 75m splenic flexure polyp, otherwise normal exam   EGD and colonoscopy 10/2009 - paper report, not interpretable - report of a duodenal  AVM that was ablated   EGD 12/19/18 - - The exam of the esophagus was otherwise normal. - A single 6 mm papule (nodule) was found in the gastric antrum, appeared to be umbilicated and subepithelial, suspicious for benign pancreatic rest. Biopsies were taken with a cold forceps for histology. - Patchy mildly erythematous mucosa was found in the gastric body and in the gastric antrum. Biopsies were taken with a cold forceps for Helicobacter pylori testing. - The exam of the stomach was otherwise normal. - A few small angiodysplastic lesions were found in the duodenal bulb and in the second portion of the duodenum (3 total - 1 bulb, 2 in 2nd portion - all small). - The exam of the duodenum was otherwise normal.     Colonoscopy 12/19/18 - The perianal and digital rectal examinations were normal. - The terminal ileum appeared normal. - A single large angiodysplastic lesion was found in the cecum. - Three sessile polyps were found in the transverse colon. The polyps were 4 to 5 mm in size. These polyps were removed with a cold snare. Resection and retrieval were complete. - A few small-mouthed diverticula were found in the sigmoid colon. - The exam was otherwise without abnormality.   1. Surgical [P], stomach, antral - BENIGN POLYPOID GASTRIC TYPE MUCOSA WITH MILD CHRONIC INFLAMMATION. - THERE IS NO EVIDENCE OF DYSPLASIA OR MALIGNANCY. 2. Surgical [P], gastric antrum and gastric body - CHRONIC INACTIVE GASTRITIS, MILD. -  THERE IS NO EVIDENCE OF HELICOBACTER PYLORI, DYSPLASIA, OR MALIGNANCY. - SEE COMMENT. 3. Surgical [P], colon, transverse, polyp (3) - TUBULAR ADENOMA(S). - HIGH GRADE DYSPLASIA IS NOT IDENTIFIED.     CT scan chest 02/12/20 - IMPRESSION: 1. Continued interval development of post treatment consolidation and fibrosis of the perihilar right lung, with essentially total fibrosis and volume loss of the right middle lobe. 2. Slight interval decrease in size of a right  paraesophageal lymph node. Unchanged prominent pretracheal lymph nodes. 3. Overall findings above are consistent with continued treatment response. 4. Unchanged small right pleural effusion. 5. Emphysema (ICD10-J43.9). 6. Coronary artery disease.  Aortic Atherosclerosis (ICD10-I70.0).     Past Medical History:  Diagnosis Date   Blood transfusion without reported diagnosis    Cataract    DM (diabetes mellitus) ([place])    GERD (gastroesophageal reflux disease)    Hyperlipidemia    Hypertension    Iron deficiency anemia    NSCL ca dx'd 03/2018   Thyroid disease     Past Surgical History:  Procedure Laterality Date   CATARACT EXTRACTION Right    CHOLECYSTECTOMY     COLONOSCOPY  10/24/2009   normal rectum/1X1cm abnormal lesion in the ascending colon (bx benign). TI normal for 10cm.  Prep difficult/inadequate. f/u TCS 09/2012 recommended   COLONOSCOPY  10/13/2004   Normal rectum/Diminutive polyps, splenic flexure, cold biopsied/removed.  Remainder of colonic mucosa appeared normal.   COLONOSCOPY N/A 12/14/2012   GTX:MIWOEHO polyp-tubular adenoma   ESOPHAGOGASTRODUODENOSCOPY  10/13/2004    Normal esophagus/ Nodular volcano like lesion in the antrum, either representing a  pancreatic rest or leiomyoma, biopsied.  Remainder of the gastric mucosa appeared normal, normal D1-D2   ESOPHAGOGASTRODUODENOSCOPY  10/24/2009   Benign biopsies. normal esophagus/small hiatal hernia/nodular lesion antrum/distal greater curvature. duodenal AVM s/p ablation   GIVENS CAPSULE STUDY  07/27/2010    multiple arteriovenous malformations which could definitely be the contributor to her drifting hemoglobin and hematocrit   IR THORACENTESIS ASP PLEURAL SPACE W/IMG GUIDE  12/16/2020   TUBAL LIGATION     VIDEO BRONCHOSCOPY WITH ENDOBRONCHIAL ULTRASOUND N/A 04/27/2018   Procedure: VIDEO BRONCHOSCOPY WITH ENDOBRONCHIAL ULTRASOUND;  Surgeon: Melrose Nakayama, MD;  Location: [facility] OR;  Service: Thoracic;  Laterality: N/A;     Current Outpatient Medications  Medication Sig Dispense Refill   albuterol (VENTOLIN HFA) 108 (90 Base) MCG/ACT inhaler Inhale 1-2 puffs into the lungs every 6 (six) hours as needed for wheezing or shortness of breath. 6.7 g 0   B-D UF III MINI PEN NEEDLES 31G X 5 MM MISC Inject into the skin every morning.     blood glucose meter kit and supplies KIT Use up to four times daily as directed 1 each 0   dapagliflozin propanediol (FARXIGA) 10 MG TABS tablet Take 1 tablet (10 mg total) by mouth daily before breakfast. 90 tablet 3   DEXILANT 30 MG capsule Take 1 capsule (30 mg total) by mouth daily. 90 capsule 3   diphenhydrAMINE (BENADRYL) 2 % cream Apply 1 application topically as needed for itching.     docusate sodium (COLACE) 100 MG capsule Take 100 mg by mouth daily as needed for mild constipation.     famotidine (PEPCID) 20 MG tablet Take 1 tablet (20 mg total) by mouth 2 (two) times daily. 30 tablet 3   Ferrous Sulfate (IRON) 325 (65 Fe) MG TABS Take 1 tablet (325 mg total) by mouth 2 (two) times daily with a meal. 180 tablet 0  fluticasone furoate-vilanterol (BREO ELLIPTA) 200-25 MCG/ACT AEPB Inhale 1 puff into the lungs daily. (Patient not taking: Reported on 04/05/2022) 30 each 3   insulin glargine (LANTUS SOLOSTAR) 100 UNIT/ML Solostar Pen Inject 36 Units into the skin every morning. And 31G, 45m pen needles 1/day 45 mL PRN   labetalol (NORMODYNE) 100 MG tablet Take 0.5 tablets (50 mg total) by mouth 2 (two) times daily. 90 tablet 3   levothyroxine (SYNTHROID) 75 MCG tablet Take 1 tablet by mouth once daily 90 tablet 3   losartan (COZAAR) 50 MG tablet Take 1 tablet (50 mg total) by mouth daily. 90 tablet 3   Multiple Vitamin (MULTIVITAMIN WITH MINERALS) TABS tablet Take 1 tablet by mouth at bedtime.      pravastatin (PRAVACHOL) 20 MG tablet Take 1 tablet (20 mg total) by mouth daily. 90 tablet 3   No current facility-administered medications for this visit.    Allergies as of  04/14/2022 - Review Complete 04/05/2022  Allergen Reaction Noted   Paclitaxel Other (See Comments) 06/15/2018   Aspirin Other (See Comments)    Esomeprazole magnesium     Ace inhibitors Other (See Comments) 03/23/2018    Family History  Problem Relation Age of Onset   Diabetes Sister    Colon cancer Maternal Aunt        greater than age 76  Breast cancer Cousin    Diabetes Daughter    Diabetes Daughter    Amblyopia Neg Hx    Blindness Neg Hx    Cataracts Neg Hx    Glaucoma Neg Hx    Macular degeneration Neg Hx    Retinal detachment Neg Hx    Strabismus Neg Hx    Retinitis pigmentosa Neg Hx    Rectal cancer Neg Hx    Stomach cancer Neg Hx    Colon polyps Neg Hx    Esophageal cancer Neg Hx     Social History   Socioeconomic History   Marital status: Single    Spouse name: Not on file   Number of children: 3   Years of education: Not on file   Highest education level: Not on file  Occupational History   Occupation: retired  Tobacco Use   Smoking status: Former    Packs/day: 0.50    Years: 55.00    Total pack years: 27.50    Types: Cigarettes    Quit date: 03/18/2018    Years since quitting: 4.0   Smokeless tobacco: Never   Tobacco comments:    smoked off and n  Vaping Use   Vaping Use: Never used  Substance and Sexual Activity   Alcohol use: No   Drug use: No   Sexual activity: Not Currently  Other Topics Concern   Not on file  Social History Narrative   Patient fully vaccinated   Social Determinants of Health   Financial Resource Strain: Low Risk  (09/30/2020)   Overall Financial Resource Strain (CARDIA)    Difficulty of Paying Living Expenses: Not hard at all  Food Insecurity: No Food Insecurity (09/30/2020)   Hunger Vital Sign    Worried About Running Out of Food in the Last Year: Never true    Ran Out of Food in the Last Year: Never true  Transportation Needs: No Transportation Needs (09/30/2020)   PRAPARE - TRadiographer, therapeutic(Medical): No    Lack of Transportation (Non-Medical): No  Physical Activity: Inactive (09/30/2020)   Exercise Vital Sign  Days of Exercise per Week: 0 days    Minutes of Exercise per Session: 0 min  Stress: No Stress Concern Present (09/30/2020)   [place]    Feeling of Stress : Not at all  Social Connections: Socially Isolated (09/30/2020)   Social Connection and Isolation Panel [NHANES]    Frequency of Communication with Friends and Family: More than three times a week    Frequency of Social Gatherings with Friends and Family: More than three times a week    Attends Religious Services: Never    Marine scientist or Organizations: No    Attends Archivist Meetings: Never    Marital Status: Never married  Intimate Partner Violence: Not At Risk (09/30/2020)   Humiliation, Afraid, Rape, and Kick questionnaire    Fear of Current or Ex-Partner: No    Emotionally Abused: No    Physically Abused: No    Sexually Abused: No    Review of Systems:    Constitutional: No weight loss, fever or chills Skin: No rash Cardiovascular: No chest pain Respiratory: +SOB Gastrointestinal: See HPI and otherwise negative Genitourinary: No dysuria  Neurological: No headache, dizziness or syncope Musculoskeletal: No new muscle or joint pain Hematologic: No bleeding  Psychiatric: No history of depression or anxiety   Physical Exam:  Vital signs: BP 114/62   Pulse 84   Ht _0  (1.651 m)   Wt 197 lb (89.4 kg)   SpO2 97%   BMI 32.78 kg/m    Constitutional:   Pleasant overweight AA female appears to be in NAD, Well developed, Well nourished, alert and cooperative Head:  Normocephalic and atraumatic. Eyes:   PEERL, EOMI. No icterus. Conjunctiva pink. Ears:  Normal auditory acuity. Neck:  Supple Throat: Oral cavity and pharynx without inflammation, swelling or lesion.  Respiratory: Respirations even  and unlabored. +wheezing, R>L posterior lung fields,  No wheezes, crackles, or rhonchi.  Cardiovascular: Normal S1, S2. No MRG. Regular rate and rhythm. No peripheral edema, cyanosis or pallor.  Gastrointestinal:  Soft, nondistended, nontender. No rebound or guarding. Normal bowel sounds. No appreciable masses or hepatomegaly. Rectal:  Not performed.  Msk:  Symmetrical without gross deformities. Without edema, no deformity or joint abnormality.  Neurologic:  Alert and  oriented x4;  grossly normal neurologically.  Skin:   Dry and intact without significant lesions or rashes. Psychiatric:  Demonstrates good judgement and reason without abnormal affect or behaviors.  RELEVANT LABS AND IMAGING: CBC    Component Value Date/Time   WBC 5.9 01/01/2022 1524   WBC 6.2 12/16/2021 1055   RBC 4.00 01/01/2022 1524   HGB 10.6 (L) 01/01/2022 1524   HGB 8.1 (L) 12/27/2017 1123   HCT 34.2 (L) 01/01/2022 1524   HCT 24.4 (L) 12/27/2017 1123   PLT 212 01/01/2022 1524   PLT 296 12/27/2017 1123   MCV 85.5 01/01/2022 1524   MCV 70 (L) 12/27/2017 1123   MCH 26.5 01/01/2022 1524   MCHC 31.0 01/01/2022 1524   RDW 16.1 (H) 01/01/2022 1524   RDW 17.0 (H) 12/27/2017 1123   LYMPHSABS 0.5 (L) 01/01/2022 1524   LYMPHSABS 0.8 12/27/2017 1123   MONOABS 0.4 01/01/2022 1524   EOSABS 0.1 01/01/2022 1524   EOSABS 0.1 12/27/2017 1123   BASOSABS 0.0 01/01/2022 1524   BASOSABS 0.0 12/27/2017 1123    CMP     Component Value Date/Time   NA 139 01/01/2022 1524   K 5.0 01/01/2022 1524  CL 109 01/01/2022 1524   CO2 23 01/01/2022 1524   GLUCOSE 124 (H) 01/01/2022 1524   BUN 32 (H) 01/01/2022 1524   BUN 24 (A) 02/12/2020 0000   CREATININE 1.56 (H) 01/01/2022 1524   CREATININE 0.72 12/10/2011 0919   CALCIUM 9.1 01/01/2022 1524   PROT 8.3 (H) 01/01/2022 1524   ALBUMIN 3.9 01/01/2022 1524   ALBUMIN 4.3 12/10/2011 0919   AST 29 01/01/2022 1524   ALT 19 01/01/2022 1524   ALKPHOS 90 01/01/2022 1524   ALKPHOS 88  12/10/2011 0919   BILITOT 0.4 01/01/2022 1524   GFRNONAA 34 (L) 01/01/2022 1524   GFRAA 43 (L) 02/12/2020 0941    Assessment: 1.  History of adenomatous polyps: Last colonoscopy in 2020 with repeat recommended in 3 years. 2.  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  Went ahead and tentatively schedule patient for a surveillance colonoscopy given her history of adenomatous polyps in the [place] with Dr. Havery Moros.  Did provide the patient a detailed list risks for the procedure and she agrees to proceed.  2.  We will send clearance to pulmonology given that they just saw her for shortness of breath to make sure they feel like anesthesia would be okay for her and also ask them about the need to possibly do this case at the hospital.  We will book procedure out a month or 2 in order to allow time for the response. 3.  Patient follow in clinic per recommendations from Dr. Havery Moros after time of procedure.  Ellouise Newer, PA-C [facility] 04/14/2022, 1:16 PM  Cc: Hoyt Koch, *

## 2022-04-15 ENCOUNTER — Telehealth: Payer: Self-pay

## 2022-04-15 DIAGNOSIS — H2512 Age-related nuclear cataract, left eye: Secondary | ICD-10-CM | POA: Diagnosis not present

## 2022-04-15 DIAGNOSIS — E119 Type 2 diabetes mellitus without complications: Secondary | ICD-10-CM | POA: Diagnosis not present

## 2022-04-15 NOTE — Telephone Encounter (Signed)
Patient letter sent to pulmonary for clearance prior to scheduled colonoscopy on 05-19-2022

## 2022-04-20 ENCOUNTER — Ambulatory Visit
Admission: RE | Admit: 2022-04-20 | Discharge: 2022-04-20 | Disposition: A | Discharge status: Home / Self Care | Payer: Medicare Other | Source: Ambulatory Visit | Attending: Internal Medicine | Admitting: Internal Medicine

## 2022-04-20 DIAGNOSIS — Z1231 Encounter for screening mammogram for malignant neoplasm of breast: Secondary | ICD-10-CM

## 2022-04-26 ENCOUNTER — Other Ambulatory Visit (HOSPITAL_COMMUNITY): Payer: Self-pay

## 2022-04-29 ENCOUNTER — Telehealth: Payer: Self-pay

## 2022-04-29 NOTE — Telephone Encounter (Signed)
Preoperative Risk Assessment Needed (See Below)   Procedure: Colonoscopy   Dr. Shearon Stalls,      Your patient is planning to undergo an endoscopic procedure (colonoscopy) on 05-19-2022.   We would appreciate your evaluation and opinion on risk assessment.   We can NOT Perform the colonoscopy until this assessment is received by [place].   Thank you for allowing [place] to care for your patient.

## 2022-05-03 NOTE — Telephone Encounter (Signed)
  ASSESSMENT AND RECOMMENDATIONS:  Preoperative Risk Calculation: The features of this patient's history that contribute to the pulmonary risk assessment include: Age, General anesthesia  This patient has a low risk of post-operative pulmonary complications by ARISCAT Index.  The absolute assessment of risk/benefit of the procedure is deferred to the primary team's evaluation.  - Patient's Estimated risk of postoperative respiratory failure is 1.6%, 3 points based on the ARISCAT Index.   0 to 25 points: Low risk: 3.6% pulmonary complication rate  26 to 44 points: Intermediate risk: 68.1% pulmonary complication rate  45 to 123 points: High risk: 59.4% pulmonary complication rate  Postoperative respiratory failure (PRF) is considered as failure to wean from mechanical ventilation within 48 hours of surgery or unplanned intubation/reintubation postoperatively. The validated risk calculator provides a risk estimate of PRF and is anticipated to aid in surgical decision-making and informed patient consent.  However risk can be accepted given the potential benefit of this intervention and it is not prohibitive.   ARISCAT: Mazo et al. Anesthesiology [phone]  Lenice Llamas, MD Pulmonary and [place] 05/03/2022 8:40 AM Pager: see AMION  If no response to pager, please call critical care on call (see AMION) until 7pm After 7:00 pm call Elink

## 2022-05-04 NOTE — Telephone Encounter (Signed)
Jenna [name] please see communication from the pulmonary physician.

## 2022-05-05 NOTE — Progress Notes (Signed)
  ASSESSMENT AND RECOMMENDATIONS:  Preoperative Risk Calculation: The features of this patient's history that contribute to the pulmonary risk assessment include: Age  This patient has a low risk of post-operative pulmonary complications by ARISCAT Index.  The absolute assessment of risk/benefit of the procedure is deferred to the primary team's evaluation.  - Patient's Estimated risk of postoperative respiratory failure is 1.6% (3 points) based on the ARISCAT Index.   0 to 25 points: Low risk: 1.1% pulmonary complication rate  26 to 44 points: Intermediate risk: 73.5% pulmonary complication rate  45 to 123 points: High risk: 67.0% pulmonary complication rate  Postoperative respiratory failure (PRF) is considered as failure to wean from mechanical ventilation within 48 hours of surgery or unplanned intubation/reintubation postoperatively. The validated risk calculator provides a risk estimate of PRF and is anticipated to aid in surgical decision-making and informed patient consent.  However risk can be accepted given the potential benefit of this intervention and it is not prohibitive.   ARISCAT: Mazo et al. Anesthesiology [phone]  Lenice Llamas, MD Pulmonary and [place] 05/05/2022 7:36 PM Pager: see AMION  If no response to pager, please call critical care on call (see AMION) until 7pm After 7:00 pm call Elink

## 2022-05-05 NOTE — Telephone Encounter (Signed)
Based on their documentation, considered low risk for pulmonary complication rate and we are not planning any intubation etc.  I think would be okay to proceed, Jenny Reichmann if you agree.  Thanks

## 2022-05-06 ENCOUNTER — Telehealth: Payer: Self-pay | Admitting: *Deleted

## 2022-05-06 NOTE — Telephone Encounter (Signed)
Dr. Havery Moros,   This pt has a very concerning PFT's and activity level, getting SOB with even basic ADL's.  She is a ASA IV and her procedure will need to be done at the hospital.   Thanks,   Osvaldo Angst

## 2022-05-06 NOTE — Telephone Encounter (Signed)
I have notified the patient, Jenna [name] procedure has been cancelled and she is now on the hospital wait list for a colonoscopy.

## 2022-05-06 NOTE — Telephone Encounter (Signed)
Dr. Havery Moros,  This pt has a very concerning PFT's and activity level, getting SOB with even basic ADL's.  She is a ASA IV and her procedure will need to be done at the hospital.  Thanks,  Osvaldo Angst

## 2022-05-06 NOTE — Telephone Encounter (Signed)
Okay thanks Nicole Kindred. I did not see this patient in the office, spoke with Anderson Malta about it who thought she was stable for anesthesia. I have added her to the wait list for hospital

## 2022-05-06 NOTE — Telephone Encounter (Deleted)
Jenna [name] MRN: [id number]         DOB: 10-19-1945         Preoperative Risk Assessment Needed (See Below)   Procedure: Colonoscopy   Dr. Annamaria Boots,      Your patient is planning to undergo an endoscopic procedure (colonoscopy) on 05-19-2022.   We would appreciate your evaluation and opinion on risk assessment.   We can NOT Perform the colonoscopy until this assessment is received by [place].   Thank you for allowing [place] to care for your patient.

## 2022-05-19 ENCOUNTER — Encounter: Payer: Medicare Other | Admitting: Gastroenterology

## 2022-06-09 ENCOUNTER — Telehealth: Payer: Self-pay | Admitting: Internal Medicine

## 2022-06-09 DIAGNOSIS — Z748 Other problems related to care provider dependency: Secondary | ICD-10-CM

## 2022-06-09 NOTE — Telephone Encounter (Signed)
Patient needs transportation arranged for her Nov. 1, 2023 appointment.  Please advise patient of results

## 2022-06-11 ENCOUNTER — Telehealth: Payer: Self-pay

## 2022-06-11 NOTE — Telephone Encounter (Addendum)
   Telephone encounter was:  Successful.  06/11/2022 Name: Jenna [name] MRN: [id number] DOB: September 22, 1945  Jenna [name] is a 76 y.o. year old female who is a primary care patient of Sharlet Salina Real Cons, MD . The community resource team was consulted for assistance with Transportation Needs   Care guide performed the following interventions: Patient provided with information about care guide support team and interviewed to confirm resource needs. Patient advised she is ambulatory and does not need assistance in and out of the vehicle. CG and patient proceeded with a conference call to [facility] in order to book a ride for 06/16/2022. Patient was advised she has 24 round-trip rides with [facility]. After 11/1 rides, patient rides will drop down to 22. The confirmation ID number that was provided for the scheduled ride for 11/1 is [id number]. Patient pickup time is 9:36am. Patient agreed to these terms. Patient requested a will-call going back home from the clinic. That means patient will be responsible to book a ride going back home with [facility] Their contact number is [phone]. CG also sent out information for [place] rides such as [employer], [employer], and [employer].   Follow Up Plan:  No further follow up planned at this time. The patient has been provided with needed resources.  [facility] management  [place], [place]  Main Phone: [phone]  E-mail: [email or web address]  Website: [email or web address]

## 2022-06-11 NOTE — Telephone Encounter (Signed)
Referral placed.

## 2022-06-16 ENCOUNTER — Ambulatory Visit (INDEPENDENT_AMBULATORY_CARE_PROVIDER_SITE_OTHER): Payer: Medicare Other | Admitting: Internal Medicine

## 2022-06-16 VITALS — BP 116/84 | HR 95 | Temp 97.6°F | Ht 65.0 in | Wt 201.0 lb

## 2022-06-16 DIAGNOSIS — E118 Type 2 diabetes mellitus with unspecified complications: Secondary | ICD-10-CM | POA: Diagnosis not present

## 2022-06-16 LAB — POCT GLYCOSYLATED HEMOGLOBIN (HGB A1C): Hemoglobin A1C: 6.9 % — AB (ref 4.0–5.6)

## 2022-06-16 NOTE — Progress Notes (Signed)
   Subjective:   Patient ID: Jenna [name], female    DOB: 01-11-1946, 76 y.o.   MRN: [id number]  HPI The patient is a 76 YO female coming in for follow up.   Review of Systems  Constitutional: Negative.   HENT: Negative.    Eyes: Negative.   Respiratory:  Positive for shortness of breath. Negative for cough and chest tightness.   Cardiovascular:  Negative for chest pain, palpitations and leg swelling.  Gastrointestinal:  Negative for abdominal distention, abdominal pain, constipation, diarrhea, nausea and vomiting.  Musculoskeletal: Negative.   Skin: Negative.   Neurological: Negative.   Psychiatric/Behavioral: Negative.      Objective:  Physical Exam Constitutional:      Appearance: She is well-developed.  HENT:     Head: Normocephalic and atraumatic.  Cardiovascular:     Rate and Rhythm: Normal rate and regular rhythm.  Pulmonary:     Effort: Pulmonary effort is normal. No respiratory distress.     Breath sounds: Normal breath sounds. No wheezing or rales.  Abdominal:     General: Bowel sounds are normal. There is no distension.     Palpations: Abdomen is soft.     Tenderness: There is no abdominal tenderness. There is no rebound.  Musculoskeletal:     Cervical back: Normal range of motion.     Comments: Ankles swollen 1+ bilateral non-pitting  Skin:    General: Skin is warm and dry.     Comments: Foot exam done.  Neurological:     Mental Status: She is alert and oriented to person, place, and time.     Coordination: Coordination normal.    Vitals:   06/16/22 1026  BP: 116/84  Pulse: 95  Temp: 97.6 F (36.4 C)  SpO2: 96%  Weight: 201 lb (91.2 kg)  Height: 5\' 5"  (1.651 m)    Assessment & Plan:

## 2022-06-16 NOTE — Patient Instructions (Addendum)
The HgA1c is 6.9 today which is good.

## 2022-06-17 ENCOUNTER — Encounter: Payer: Self-pay | Admitting: Internal Medicine

## 2022-06-17 NOTE — Assessment & Plan Note (Signed)
POC HgA1c done today and 6.9 which is at goal. Her insulin was changed at last endo visit and will keep dosing same at 36 units lantus daily and farxiga 10 mg daily. She is on ARB and statin. Foot exam done. Eye exam reminded. Referral to endo done per patient request.

## 2022-06-23 ENCOUNTER — Telehealth: Payer: Self-pay

## 2022-06-23 NOTE — Telephone Encounter (Signed)
This nurse reached out to this patient related to scheduling appointment for CT scan.  Patient request for scan to scheduled on the same day as her labs.  This nurse reached out to central scheduling and was able to set appointment for the patient.  Patient has  been made aware of labs and CT appointments.  Knows preparation and arrival instructions.  Acknowledges understanding and knows to call clinic if she has any questions or concerns.

## 2022-06-25 ENCOUNTER — Telehealth: Payer: Self-pay | Admitting: Physician Assistant

## 2022-06-25 NOTE — Telephone Encounter (Signed)
Called patient regarding rescheduled 11/27 rescheduled appointment time, patient has been called and voicemail was left.

## 2022-07-09 ENCOUNTER — Other Ambulatory Visit: Payer: Self-pay

## 2022-07-09 ENCOUNTER — Ambulatory Visit (HOSPITAL_COMMUNITY)
Admission: RE | Admit: 2022-07-09 | Discharge: 2022-07-09 | Disposition: A | Discharge status: Home / Self Care | Payer: Medicare Other | Source: Ambulatory Visit | Attending: Internal Medicine | Admitting: Internal Medicine

## 2022-07-09 ENCOUNTER — Inpatient Hospital Stay: Payer: Medicare Other | Attending: Internal Medicine

## 2022-07-09 DIAGNOSIS — Z85118 Personal history of other malignant neoplasm of bronchus and lung: Secondary | ICD-10-CM | POA: Insufficient documentation

## 2022-07-09 DIAGNOSIS — J90 Pleural effusion, not elsewhere classified: Secondary | ICD-10-CM | POA: Diagnosis not present

## 2022-07-09 DIAGNOSIS — C3490 Malignant neoplasm of unspecified part of unspecified bronchus or lung: Secondary | ICD-10-CM | POA: Insufficient documentation

## 2022-07-09 DIAGNOSIS — R918 Other nonspecific abnormal finding of lung field: Secondary | ICD-10-CM | POA: Diagnosis not present

## 2022-07-09 LAB — CMP (CANCER CENTER ONLY)
ALT: 13 U/L (ref 0–44)
AST: 21 U/L (ref 15–41)
Albumin: 4.1 g/dL (ref 3.5–5.0)
Alkaline Phosphatase: 89 U/L (ref 38–126)
Anion gap: 7 (ref 5–15)
BUN: 34 mg/dL — ABNORMAL HIGH (ref 8–23)
CO2: 26 mmol/L (ref 22–32)
Calcium: 9.9 mg/dL (ref 8.9–10.3)
Chloride: 107 mmol/L (ref 98–111)
Creatinine: 1.68 mg/dL — ABNORMAL HIGH (ref 0.44–1.00)
GFR, Estimated: 31 mL/min — ABNORMAL LOW (ref >60)
Glucose, Bld: 155 mg/dL — ABNORMAL HIGH (ref 70–99)
Potassium: 4.5 mmol/L (ref 3.5–5.1)
Sodium: 140 mmol/L (ref 135–145)
Total Bilirubin: 0.4 mg/dL (ref 0.3–1.2)
Total Protein: 8.2 g/dL — ABNORMAL HIGH (ref 6.5–8.1)

## 2022-07-09 LAB — CBC WITH DIFFERENTIAL (CANCER CENTER ONLY)
Abs Immature Granulocytes: 0.01 10*3/uL (ref 0.00–0.07)
Basophils Absolute: 0 10*3/uL (ref 0.0–0.1)
Basophils Relative: 0 %
Eosinophils Absolute: 0.1 10*3/uL (ref 0.0–0.5)
Eosinophils Relative: 1 %
HCT: 34.1 % — ABNORMAL LOW (ref 36.0–46.0)
Hemoglobin: 10.4 g/dL — ABNORMAL LOW (ref 12.0–15.0)
Immature Granulocytes: 0 %
Lymphocytes Relative: 9 %
Lymphs Abs: 0.5 10*3/uL — ABNORMAL LOW (ref 0.7–4.0)
MCH: 26.3 pg (ref 26.0–34.0)
MCHC: 30.5 g/dL (ref 30.0–36.0)
MCV: 86.3 fL (ref 80.0–100.0)
Monocytes Absolute: 0.4 10*3/uL (ref 0.1–1.0)
Monocytes Relative: 7 %
Neutro Abs: 4.4 10*3/uL (ref 1.7–7.7)
Neutrophils Relative %: 83 %
Platelet Count: 216 10*3/uL (ref 150–400)
RBC: 3.95 MIL/uL (ref 3.87–5.11)
RDW: 16.7 % — ABNORMAL HIGH (ref 11.5–15.5)
WBC Count: 5.3 10*3/uL (ref 4.0–10.5)
nRBC: 0 % (ref 0.0–0.2)

## 2022-07-12 ENCOUNTER — Telehealth: Payer: Self-pay | Admitting: Physician Assistant

## 2022-07-12 ENCOUNTER — Ambulatory Visit: Payer: Medicare Other | Admitting: Physician Assistant

## 2022-07-12 ENCOUNTER — Ambulatory Visit: Payer: Medicare Other | Admitting: Internal Medicine

## 2022-07-12 NOTE — Telephone Encounter (Signed)
Called patient regarding upcoming December appointment, patient is notified.

## 2022-07-18 NOTE — Progress Notes (Unsigned)
[facility] OFFICE PROGRESS NOTE  Hoyt Koch, MD [place] [address]  DIAGNOSIS: Stage IIIB (T3, N3, M0) non-small cell lung cancer, squamous cell carcinoma presented with large right middle lobe lung breast in addition to mediastinal and bilateral hilar lymphadenopathy and suspicious right supraclavicular lymph node diagnosed in August 2019   PRIOR THERAPY: 1) A course of concurrent chemoradiation with weekly carboplatin for AUC of 2 and paclitaxel 45 MG/M2.  First dose of chemotherapy given on 05/29/2018.  Status post 5 cycles. 2) Consolidation treatment with immunotherapy with Imfinzi 10 mg/KG every 2 weeks.  First dose August 10, 2018.  Status post 26 cycles.  CURRENT THERAPY: Observation   INTERVAL HISTORY: Cathaleen Korol 76 y.o. female returns to the clinic today for a follow-up visit.  The patient was last seen 6 months ago by Dr. Julien Nordmann.  The patient is feeling well without any concerning complaints.  She has been on observation since 2020.  She denies any changes in her health since last being seen. Denies any fever, chills, night sweats, or unexplained weight loss.  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  She is expected to follow-up with her pulmonologist in January.  She denies any cough, chest pain, or hemoptysis.  Denies any nausea, vomiting, diarrhea, or constipation.  She states she received a call from gastroenterology.  Planning on a repeat colonoscopy to be performed in February.  They would like this to be performed at the hospital due to her respiratory function.  The patient states she is supposed to be taking iron supplement but has some at home but has not been taking this.  The patient denies any recent vaccines.  denies any headache or visual changes.  She recently had a restaging CT scan performed.  She is here today for  evaluation to review her scan results.     MEDICAL HISTORY: Past Medical History:  Diagnosis Date   Blood transfusion without reported diagnosis    Cataract    DM (diabetes mellitus) ([facility])    GERD (gastroesophageal reflux disease)    Hyperlipidemia    Hypertension    Iron deficiency anemia    NSCL ca dx'd 03/2018   Thyroid disease     ALLERGIES:  is allergic to paclitaxel, aspirin, esomeprazole magnesium, and ace inhibitors.  MEDICATIONS:  Current Outpatient Medications  Medication Sig Dispense Refill   albuterol (VENTOLIN HFA) 108 (90 Base) MCG/ACT inhaler Inhale 1-2 puffs into the lungs every 6 (six) hours as needed for wheezing or shortness of breath. 6.7 g 0   B-D UF III MINI PEN NEEDLES 31G X 5 MM MISC Inject into the skin every morning.     blood glucose meter kit and supplies KIT Use up to four times daily as directed 1 each 0   dapagliflozin propanediol (FARXIGA) 10 MG TABS tablet Take 1 tablet (10 mg total) by mouth daily before breakfast. 90 tablet 3   DEXILANT 30 MG capsule Take 1 capsule (30 mg total) by mouth daily. 90 capsule 3   diphenhydrAMINE (BENADRYL) 2 % cream Apply 1 application topically as needed for itching.     docusate sodium (COLACE) 100 MG capsule Take 100 mg by mouth daily as needed for mild constipation.     Ferrous Sulfate (IRON) 325 (65 Fe) MG TABS Take 1 tablet (325 mg total) by mouth 2 (two) times daily with  a meal. 180 tablet 0   fluticasone furoate-vilanterol (BREO ELLIPTA) 200-25 MCG/ACT AEPB Inhale 1 puff into the lungs daily. 30 each 3   insulin glargine (LANTUS SOLOSTAR) 100 UNIT/ML Solostar Pen Inject 36 Units into the skin every morning. And 31G, 48m pen needles 1/day 45 mL PRN   labetalol (NORMODYNE) 100 MG tablet Take 0.5 tablets (50 mg total) by mouth 2 (two) times daily. 90 tablet 3   levothyroxine (SYNTHROID) 75 MCG tablet Take 1 tablet by mouth once daily 90 tablet 3   losartan (COZAAR) 50 MG tablet Take 1 tablet (50 mg total) by  mouth daily. 90 tablet 3   Multiple Vitamin (MULTIVITAMIN WITH MINERALS) TABS tablet Take 1 tablet by mouth at bedtime.      pravastatin (PRAVACHOL) 20 MG tablet Take 1 tablet (20 mg total) by mouth daily. 90 tablet 3   No current facility-administered medications for this visit.    SURGICAL HISTORY:  Past Surgical History:  Procedure Laterality Date   CATARACT EXTRACTION Right    CHOLECYSTECTOMY     COLONOSCOPY  10/24/2009   normal rectum/1X1cm abnormal lesion in the ascending colon (bx benign). TI normal for 10cm.  Prep difficult/inadequate. f/u TCS 09/2012 recommended   COLONOSCOPY  10/13/2004   Normal rectum/Diminutive polyps, splenic flexure, cold biopsied/removed.  Remainder of colonic mucosa appeared normal.   COLONOSCOPY N/A 12/14/2012   RGBT:DVVOHYWpolyp-tubular adenoma   ESOPHAGOGASTRODUODENOSCOPY  10/13/2004    Normal esophagus/ Nodular volcano like lesion in the antrum, either representing a  pancreatic rest or leiomyoma, biopsied.  Remainder of the gastric mucosa appeared normal, normal D1-D2   ESOPHAGOGASTRODUODENOSCOPY  10/24/2009   Benign biopsies. normal esophagus/small hiatal hernia/nodular lesion antrum/distal greater curvature. duodenal AVM s/p ablation   GIVENS CAPSULE STUDY  07/27/2010    multiple arteriovenous malformations which could definitely be the contributor to her drifting hemoglobin and hematocrit   IR THORACENTESIS ASP PLEURAL SPACE W/IMG GUIDE  12/16/2020   TUBAL LIGATION     [place]     VIDEO BRONCHOSCOPY WITH ENDOBRONCHIAL ULTRASOUND N/A 04/27/2018   Procedure: VIDEO BRONCHOSCOPY WITH ENDOBRONCHIAL ULTRASOUND;  Surgeon: HMelrose Nakayama MD;  [facility]: [facility]  Service: Thoracic;  Laterality: N/A;    REVIEW OF SYSTEMS:   Review of Systems  Constitutional: Negative for appetite change, chills, fatigue, fever and unexpected weight change.  HENT: Negative for mouth sores, nosebleeds, sore throat and trouble swallowing.   Eyes:  Negative for eye problems and icterus.  Respiratory: Positive for shortness of breath with exertion.  Negative for cough, hemoptysis, and wheezing.   Cardiovascular: Negative for chest pain and leg swelling.  Gastrointestinal: Negative for abdominal pain, constipation, diarrhea, nausea and vomiting.  Genitourinary: Negative for bladder incontinence, difficulty urinating, dysuria, frequency and hematuria.   Musculoskeletal: Negative for back pain, gait problem, neck pain and neck stiffness.  Skin: Negative for itching and rash.  Neurological: Negative for dizziness, extremity weakness, gait problem, headaches, light-headedness and seizures.  Hematological: Negative for adenopathy. Does not bruise/bleed easily.  Psychiatric/Behavioral: Negative for confusion, depression and sleep disturbance. The patient is not nervous/anxious.     PHYSICAL EXAMINATION:  Blood pressure (!) 170/68, pulse 98, temperature 98.1 F (36.7 C), resp. rate (!) 24, weight 200 lb 3.2 oz (90.8 kg), SpO2 97 %.  ECOG PERFORMANCE STATUS: 1  Physical Exam  Constitutional: Oriented to person, place, and time and well-developed, well-nourished, and in no distress.  HENT:  Head: Normocephalic and atraumatic.  Mouth/Throat: Oropharynx is clear and moist. No  oropharyngeal exudate.  Eyes: Conjunctivae are normal. Right eye exhibits no discharge. Left eye exhibits no discharge. No scleral icterus.  Neck: Normal range of motion. Neck supple.  Cardiovascular: Normal rate, regular rhythm, normal heart sounds and intact distal pulses.   Pulmonary/Chest: Effort normal.  Quiet breath sounds bilaterally.  No respiratory distress. No wheezes. No rales.  Abdominal: Soft. Bowel sounds are normal. Exhibits no distension and no mass. There is no tenderness.  Musculoskeletal: Normal range of motion. Exhibits no edema.  Lymphadenopathy:    No cervical adenopathy.  Neurological: Alert and oriented to person, place, and time. Exhibits normal  muscle tone. Gait normal. Coordination normal.  Skin: Skin is warm and dry. No rash noted. Not diaphoretic. No erythema. No pallor.  Psychiatric: Mood, memory and judgment normal.  Vitals reviewed.  LABORATORY DATA: Lab Results  Component Value Date   WBC 5.3 07/09/2022   HGB 10.4 (L) 07/09/2022   HCT 34.1 (L) 07/09/2022   MCV 86.3 07/09/2022   PLT 216 07/09/2022      Chemistry      Component Value Date/Time   NA 140 07/09/2022 1013   K 4.5 07/09/2022 1013   CL 107 07/09/2022 1013   CO2 26 07/09/2022 1013   BUN 34 (H) 07/09/2022 1013   BUN 24 (A) 02/12/2020 0000   CREATININE 1.68 (H) 07/09/2022 1013   CREATININE 0.72 12/10/2011 0919      Component Value Date/Time   CALCIUM 9.9 07/09/2022 1013   ALKPHOS 89 07/09/2022 1013   ALKPHOS 88 12/10/2011 0919   AST 21 07/09/2022 1013   ALT 13 07/09/2022 1013   BILITOT 0.4 07/09/2022 1013       RADIOGRAPHIC STUDIES:  CT Chest Wo Contrast  Result Date: 07/10/2022 CLINICAL DATA:  Non-small cell lung cancer staging; * Tracking Code: BO * EXAM: CT CHEST WITHOUT CONTRAST TECHNIQUE: Multidetector CT imaging of the chest was performed following the standard protocol without IV contrast. RADIATION DOSE REDUCTION: This exam was performed according to the departmental dose-optimization program which includes automated exposure control, adjustment of the mA and/or kV according to patient size and/or use of iterative reconstruction technique. COMPARISON:  Chest CT dated Jan 04, 2022 FINDINGS: Cardiovascular: Normal heart size. Trace pericardial effusion normal caliber thoracic aorta with moderate calcified plaque. Severe left main and three-vessel coronary artery calcifications. Mediastinum/Nodes: Thyroid and esophagus are unremarkable. No pathologically enlarged mediastinal or hilar lymph nodes. Subcentimeter right axillary lymph nodes are increased in size when compared with prior exam with mild adjacent fat stranding, likely reactive.  Reference node measures 7 mm short axis on series 2, image 46, previously measured 3 mm. Lungs/Pleura: Central airways are patent. Stable right perihilar postradiation change. Numerous scattered irregular linear and ground-glass opacities of the left lung base, unchanged when compared with prior and likely due to scarring. Stable small scattered solid and ground-glass pulmonary nodules. Reference solid nodule of the left upper lobe measuring 4 mm on series 5, image 65. Stable moderate loculated right pleural effusion. Upper Abdomen: No acute abnormality. Musculoskeletal: Stable sternal irregularity likely post radiation change. No aggressive appearing osseous lesions. IMPRESSION: 1. Stable right perihilar postradiation change with no evidence of recurrent or metastatic disease. 2. Stable moderate loculated right pleural effusion. 3. Stable small scattered solid and ground-glass pulmonary nodules. 4. Subcentimeter right axillary lymph nodes are increased in size when compared with prior exam with mild adjacent fat stranding, likely reactive. Correlate for recent immunization. Electronically Signed   By: Hosie Poisson.D.  On: 07/10/2022 21:11     ASSESSMENT/PLAN:  This is a very pleasant 76 year old African-American female diagnosed with stage IIIb non-small cell lung cancer, squamous cell carcinoma. She presented with a large right middle lobe lung mass in addition to mediastinal and bilateral hilar lymphadenopathy.  She also has suspicious right supraclavicular lymphadenopathy.  She was diagnosed in August 2019.   She previously underwent concurrent chemoradiation with carboplatin and paclitaxel.  She is status post 5 cycles with a partial response.  She tolerated treatment fairly well except for pancytopenia and fatigue.   She completed 26 cycles of consolidation immunotherapy with Imfinzi 10 mg/kg IV every 2 weeks.  This was completed in 2020  She has been on observation since that time  The  patient recently had a restaging CT scan performed.  Dr. Julien Nordmann personally and independently reviewed her scan and discussed results with the patient today.  The scan did not show any evidence of disease progression.  Her scan showed subcentimeter right axillary lymph nodes are slightly increased in size.  The patient denies any recent immunizations.  Dr. Julien Nordmann recommends that she continue on observation with a repeat CT scan of the chest in 6 months.  Will order without contrast due to her renal insufficiency.  Will see her back for follow-up visit a few days after her scan to review the results.  Reviewed with the patient that common causes of renal dysfunction is related to uncontrolled blood pressure and diabetes.  I strongly encouraged her to make sure that she is monitoring this closely and following with her PCP for health maintenance and routine health screenings.  I recall several years ago the patient expressed reluctance with health maintenance.  The patient is anemic on labs.  She states she supposed be taking iron supplement.  I encouraged her to resume taking these.  She is supposed to have a colonoscopy performed in February 2024.  She will follow-up with her pulmonologist in January 2024 for her dyspnea on exertion and restrictive lung disease.   The patient was advised to call immediately if she has any concerning symptoms in the interval. The patient voices understanding of current disease status and treatment options and is in agreement with the current care plan. All questions were answered. The patient knows to call the clinic with any problems, questions or concerns. We can certainly see the patient much sooner if necessary   Orders Placed This Encounter  Procedures   CT Chest Wo Contrast    Standing Status:   Future    Standing Expiration Date:   07/20/2023    Order Specific Question:   Preferred imaging location?    Answer:   [facility]   CBC with  Differential ([facility] Only)    Standing Status:   Future    Standing Expiration Date:   07/21/2023   CMP ([facility] only)    Standing Status:   Future    Standing Expiration Date:   07/21/2023      Renaldo Gornick L Riven Beebe, PA-C 07/20/22  ADDENDUM: I had a face-to-face encounter with the patient today.  I reviewed her records, lab, scan and recommended her care plan.  This is a very pleasant 76 years old African-American female with stage IIIb non-small cell lung cancer, squamous cell carcinoma diagnosed in August 2019 status post a course of concurrent chemoradiation followed by 1 year of consolidation immunotherapy with Imfinzi completed in 2020.  The patient has been on observation since that time and she  is feeling fine with no concerning complaints. She had repeat CT scan of the chest performed recently.  I personally and independently reviewed the scan and discussed the result with the patient today. Her scan showed no concerning findings for disease progression. I recommended for her to continue on observation with repeat CT scan of the chest and 6 months. For the renal insufficiency, she will follow with her primary care physician and she was advised to told her hypertension and avoid any NSAIDs. The patient was advised to call immediately if she has any other concerning symptoms in the interval. Disclaimer: This note was dictated with voice recognition software. Similar sounding words can inadvertently be transcribed and may be missed upon review. Eilleen Kempf, MD

## 2022-07-19 ENCOUNTER — Telehealth: Payer: Self-pay

## 2022-07-19 NOTE — Telephone Encounter (Signed)
LM for patient asking if she would like to have her colonoscopy procedure with Dr. Havery Moros at [facility] on Thursday 09-23-22.  Asked her to call back as soon as possible to let us know

## 2022-07-20 ENCOUNTER — Inpatient Hospital Stay: Payer: Medicare Other | Attending: Internal Medicine | Admitting: Physician Assistant

## 2022-07-20 ENCOUNTER — Other Ambulatory Visit: Payer: Self-pay

## 2022-07-20 VITALS — BP 170/68 | HR 98 | Temp 98.1°F | Resp 24 | Wt 200.2 lb

## 2022-07-20 DIAGNOSIS — I10 Essential (primary) hypertension: Secondary | ICD-10-CM | POA: Insufficient documentation

## 2022-07-20 DIAGNOSIS — Z7951 Long term (current) use of inhaled steroids: Secondary | ICD-10-CM | POA: Diagnosis not present

## 2022-07-20 DIAGNOSIS — Z79899 Other long term (current) drug therapy: Secondary | ICD-10-CM | POA: Insufficient documentation

## 2022-07-20 DIAGNOSIS — Z85118 Personal history of other malignant neoplasm of bronchus and lung: Secondary | ICD-10-CM | POA: Insufficient documentation

## 2022-07-20 DIAGNOSIS — C3490 Malignant neoplasm of unspecified part of unspecified bronchus or lung: Secondary | ICD-10-CM | POA: Diagnosis not present

## 2022-07-20 DIAGNOSIS — N289 Disorder of kidney and ureter, unspecified: Secondary | ICD-10-CM | POA: Diagnosis not present

## 2022-07-20 DIAGNOSIS — E119 Type 2 diabetes mellitus without complications: Secondary | ICD-10-CM | POA: Insufficient documentation

## 2022-07-20 DIAGNOSIS — E079 Disorder of thyroid, unspecified: Secondary | ICD-10-CM | POA: Insufficient documentation

## 2022-07-20 DIAGNOSIS — C342 Malignant neoplasm of middle lobe, bronchus or lung: Secondary | ICD-10-CM

## 2022-07-20 DIAGNOSIS — Z794 Long term (current) use of insulin: Secondary | ICD-10-CM | POA: Insufficient documentation

## 2022-07-20 DIAGNOSIS — Z9221 Personal history of antineoplastic chemotherapy: Secondary | ICD-10-CM | POA: Diagnosis not present

## 2022-07-20 DIAGNOSIS — Z7984 Long term (current) use of oral hypoglycemic drugs: Secondary | ICD-10-CM | POA: Insufficient documentation

## 2022-07-20 DIAGNOSIS — Z923 Personal history of irradiation: Secondary | ICD-10-CM | POA: Insufficient documentation

## 2022-07-20 NOTE — Telephone Encounter (Signed)
Thank you Jan.  I agree with your recommendations.  If she can do this sooner she should, it is up to her, she can call us in the interim if she changes her mind.  Thanks

## 2022-07-20 NOTE — Telephone Encounter (Signed)
Called and spoke to patient.  She does not want to have her procedure until April because of potential bad weather and she has to have someone drive her and doesn't want to ask them to be out in bad weather.  She also doesn't want to have it "so early in the morning". I let her know that Dr. Havery Moros only has availability to do procedures in the morning at the hospital so it will always be a morning procedure. I cautioned her that she should not wait too long to have her colonoscopy as you run the risk of missing colon cancer early. She indicated she will call us if she changes her mind and wants to have sooner that April. I will leave her on wait list for April.

## 2022-07-20 NOTE — Telephone Encounter (Signed)
Inbound call from patient stating she does not have a calendar that far out and also states that the apt is around day light savings time. Patients states she would like to have the apt around spring. Please advise.  Thank you

## 2022-09-05 ENCOUNTER — Other Ambulatory Visit: Payer: Self-pay | Admitting: Internal Medicine

## 2022-09-05 DIAGNOSIS — I10 Essential (primary) hypertension: Secondary | ICD-10-CM

## 2022-10-04 ENCOUNTER — Other Ambulatory Visit: Payer: Self-pay | Admitting: Internal Medicine

## 2022-10-06 ENCOUNTER — Other Ambulatory Visit: Payer: Self-pay

## 2022-10-06 ENCOUNTER — Telehealth: Payer: Self-pay

## 2022-10-06 DIAGNOSIS — R0600 Dyspnea, unspecified: Secondary | ICD-10-CM

## 2022-10-06 DIAGNOSIS — Z86010 Personal history of colonic polyps: Secondary | ICD-10-CM

## 2022-10-06 NOTE — Telephone Encounter (Signed)
Called and spoke to patient. Offered her April 25th, Thursday at [facility] to have her Colonoscopy with Dr. Havery Moros.  She agreed.  Scheduled her for a PV on Wed, 4-10 to go through instructions. Patient confirmed 2:00pm appointment and would like the latest morning appointment possible. Case# [id number] Currently scheduled for 11am

## 2022-11-08 ENCOUNTER — Telehealth: Payer: Self-pay

## 2022-11-08 ENCOUNTER — Other Ambulatory Visit: Payer: Self-pay | Admitting: Internal Medicine

## 2022-11-08 ENCOUNTER — Telehealth: Payer: Self-pay | Admitting: Internal Medicine

## 2022-11-08 DIAGNOSIS — I10 Essential (primary) hypertension: Secondary | ICD-10-CM

## 2022-11-08 NOTE — Telephone Encounter (Signed)
-----   Message from Roetta Sessions, [place] sent at 10/06/2022 12:00 PM EST ----- Regarding: send letter to pt Send letter to patient reminder her of the date and time of PV And of her colonoscopy at [facility] on 4-25 with [place]. [name] She wants the latest morning procedure time.  Started her at 11am but did not tell her time until we saw what other cases are added that day.

## 2022-11-08 NOTE — Telephone Encounter (Signed)
Letter mailed to patient with appointment information  

## 2022-11-08 NOTE — Telephone Encounter (Signed)
Prescription Request  11/08/2022  LOV: 06/16/2022  What is the name of the medication or equipment? levothyroxine (SYNTHROID) 75 MCG tablet   Have you contacted your pharmacy to request a refill? Yes   Which pharmacy would you like this sent to?  [facility] ([place]), [place] - [address] [place] [address] Phone: [phone] Fax: [phone]    Patient notified that their request is being sent to the clinical staff for review and that they should receive a response within 2 business days.   Please advise at Mobile [phone] (mobile)    Patient no longer sees prescribing physician and would like for Dr. Sharlet Salina to fill it, if possible.

## 2022-11-09 ENCOUNTER — Other Ambulatory Visit: Payer: Self-pay

## 2022-11-09 MED ORDER — LEVOTHYROXINE SODIUM 75 MCG PO TABS: 75.0000 ug | ORAL_TABLET | Freq: Every day | ORAL | 0 refills | Status: DC

## 2022-11-09 NOTE — Telephone Encounter (Signed)
I don't see any labs for thyroid since 2022. Has she been having this checked elsewhere? Can she get [place] more recent labs. Otherwise may need visit with labs for refill

## 2022-11-09 NOTE — Telephone Encounter (Signed)
She is due for physical in May so would recommend to keep that. Okay to give 60 day fill until physical and will check labs then

## 2022-11-09 NOTE — Telephone Encounter (Signed)
Called patient and let her know that her medicine is sent in and to keep her appointment

## 2022-11-24 ENCOUNTER — Telehealth: Payer: Self-pay

## 2022-11-24 ENCOUNTER — Ambulatory Visit (AMBULATORY_SURGERY_CENTER): Payer: 59

## 2022-11-24 VITALS — Ht 65.0 in | Wt 205.4 lb

## 2022-11-24 DIAGNOSIS — Z86010 Personal history of colonic polyps: Secondary | ICD-10-CM

## 2022-11-24 NOTE — Telephone Encounter (Signed)
Called patient and left message that she had an appointment with PV..  Will try back.

## 2022-11-24 NOTE — Progress Notes (Signed)
No egg or soy allergy known to patient  No issues known to pt with past sedation with any surgeries or procedures Patient denies ever being told they had issues or difficulty with intubation  No FH of Malignant Hyperthermia Pt is not on diet pills Pt is not on  home 02  Pt is not on blood thinners  Pt denies issues with constipation  No A fib or A flutter Have any cardiac testing pending--no Pt instructed to use [email or web address] or GoodRx for a price reduction on prep   

## 2022-11-24 NOTE — Telephone Encounter (Signed)
Patient showed up in person for pv

## 2022-11-30 ENCOUNTER — Other Ambulatory Visit: Payer: Self-pay | Admitting: Internal Medicine

## 2022-11-30 DIAGNOSIS — I10 Essential (primary) hypertension: Secondary | ICD-10-CM

## 2022-12-02 ENCOUNTER — Encounter (HOSPITAL_COMMUNITY): Payer: Self-pay | Admitting: Gastroenterology

## 2022-12-02 NOTE — Progress Notes (Signed)
Jenna [name]  Prep instructions- reviewed  Jenna Monica MD Pulmonologist- Dr Celine Mans  PFTs- 03/15/22 EKG-09/01/21 Echo-04/12/22 Cath-n/a Stress- 11/02/22 ICD/PM-n/a Blood thinner-n/a GLP-1-n/a  Hx: HTN, DM, Lung cancer. Saw Pulmonolgy in August 2023 for SOB, patient endorses this began after her chemo/radiation in 2019. She did develop pleural effusion needed thoracentesis 2022. They imaged her pleural space and saw no evidence of effusion. They recommended f/u in 3 months. At that time they did a pulm clearance for colonoscopy scheduled in the office in october 2023. After the office anesthesia team reviewed her they felt she needed to be done in hospital. Patient rescheduled it for April 2024. Per patient has not seen pulm since  03/2022 and still having SOB on exertion but does not require 02 or breathing treatments. Anesthesia Review: Yes

## 2022-12-08 NOTE — Anesthesia Preprocedure Evaluation (Addendum)
Anesthesia Evaluation  Patient identified by MRN, date of birth, ID band Patient awake    Reviewed: Allergy & Precautions, NPO status , Patient's Chart, lab work & pertinent test results  Airway Mallampati: II  TM Distance: >3 FB Neck ROM: Full    Dental no notable dental hx. (+) Edentulous Upper, Poor Dentition, Dental Advisory Given, Missing,    Pulmonary former smoker   Pulmonary exam normal breath sounds clear to auscultation       Cardiovascular Exercise Tolerance: Good hypertension, Pt. on home beta blockers and Pt. on medications Normal cardiovascular exam Rhythm:Regular Rate:Normal     Neuro/Psych negative neurological ROS  negative psych ROS   GI/Hepatic ,GERD  ,,  Endo/Other  diabetes    Renal/GU      Musculoskeletal negative musculoskeletal ROS (+)    Abdominal   Peds  Hematology  (+) Blood dyscrasia, anemia   Anesthesia Other Findings All: see list  Reproductive/Obstetrics                             Anesthesia Physical Anesthesia Plan  ASA: 3  Anesthesia Plan: MAC   Post-op Pain Management:    Induction: Intravenous  PONV Risk Score and Plan: 3 and Treatment may vary due to age or medical condition  Airway Management Planned: Natural Airway and Nasal Cannula  Additional Equipment: None  Intra-op Plan:   Post-operative Plan: Extubation in OR  Informed Consent: I have reviewed the patients History and Physical, chart, labs and discussed the procedure including the risks, benefits and alternatives for the proposed anesthesia with the patient or authorized representative who has indicated his/her understanding and acceptance.     Dental advisory given  Plan Discussed with: CRNA  Anesthesia Plan Comments: (Hx of colon polyps)        Anesthesia Quick Evaluation

## 2022-12-09 ENCOUNTER — Ambulatory Visit (HOSPITAL_BASED_OUTPATIENT_CLINIC_OR_DEPARTMENT_OTHER): Payer: 59 | Admitting: Physician Assistant

## 2022-12-09 ENCOUNTER — Other Ambulatory Visit: Payer: Self-pay

## 2022-12-09 ENCOUNTER — Ambulatory Visit (HOSPITAL_COMMUNITY)
Admission: RE | Admit: 2022-12-09 | Discharge: 2022-12-09 | Disposition: A | Discharge status: Home / Self Care | Payer: 59 | Attending: Gastroenterology | Admitting: Gastroenterology

## 2022-12-09 ENCOUNTER — Encounter (HOSPITAL_COMMUNITY)
Admission: RE | Disposition: A | Discharge status: Home / Self Care | Payer: Self-pay | Source: Home / Self Care | Attending: Gastroenterology

## 2022-12-09 ENCOUNTER — Ambulatory Visit (HOSPITAL_COMMUNITY): Payer: 59 | Admitting: Physician Assistant

## 2022-12-09 DIAGNOSIS — D125 Benign neoplasm of sigmoid colon: Secondary | ICD-10-CM

## 2022-12-09 DIAGNOSIS — K5520 Angiodysplasia of colon without hemorrhage: Secondary | ICD-10-CM | POA: Insufficient documentation

## 2022-12-09 DIAGNOSIS — Z09 Encounter for follow-up examination after completed treatment for conditions other than malignant neoplasm: Secondary | ICD-10-CM | POA: Diagnosis present

## 2022-12-09 DIAGNOSIS — Y842 Radiological procedure and radiotherapy as the cause of abnormal reaction of the patient, or of later complication, without mention of misadventure at the time of the procedure: Secondary | ICD-10-CM | POA: Diagnosis not present

## 2022-12-09 DIAGNOSIS — Z7984 Long term (current) use of oral hypoglycemic drugs: Secondary | ICD-10-CM | POA: Insufficient documentation

## 2022-12-09 DIAGNOSIS — Z9221 Personal history of antineoplastic chemotherapy: Secondary | ICD-10-CM | POA: Diagnosis not present

## 2022-12-09 DIAGNOSIS — K6289 Other specified diseases of anus and rectum: Secondary | ICD-10-CM | POA: Diagnosis not present

## 2022-12-09 DIAGNOSIS — R0600 Dyspnea, unspecified: Secondary | ICD-10-CM

## 2022-12-09 DIAGNOSIS — K6389 Other specified diseases of intestine: Secondary | ICD-10-CM

## 2022-12-09 DIAGNOSIS — K639 Disease of intestine, unspecified: Secondary | ICD-10-CM

## 2022-12-09 DIAGNOSIS — Z86010 Personal history of colonic polyps: Secondary | ICD-10-CM | POA: Diagnosis not present

## 2022-12-09 DIAGNOSIS — Z794 Long term (current) use of insulin: Secondary | ICD-10-CM | POA: Diagnosis not present

## 2022-12-09 DIAGNOSIS — K219 Gastro-esophageal reflux disease without esophagitis: Secondary | ICD-10-CM | POA: Diagnosis not present

## 2022-12-09 DIAGNOSIS — E119 Type 2 diabetes mellitus without complications: Secondary | ICD-10-CM | POA: Insufficient documentation

## 2022-12-09 DIAGNOSIS — J701 Chronic and other pulmonary manifestations due to radiation: Secondary | ICD-10-CM | POA: Insufficient documentation

## 2022-12-09 DIAGNOSIS — Z87891 Personal history of nicotine dependence: Secondary | ICD-10-CM | POA: Insufficient documentation

## 2022-12-09 DIAGNOSIS — D126 Benign neoplasm of colon, unspecified: Secondary | ICD-10-CM | POA: Diagnosis not present

## 2022-12-09 DIAGNOSIS — D649 Anemia, unspecified: Secondary | ICD-10-CM | POA: Diagnosis not present

## 2022-12-09 DIAGNOSIS — Z1211 Encounter for screening for malignant neoplasm of colon: Secondary | ICD-10-CM | POA: Diagnosis not present

## 2022-12-09 DIAGNOSIS — Z79899 Other long term (current) drug therapy: Secondary | ICD-10-CM | POA: Diagnosis not present

## 2022-12-09 DIAGNOSIS — I10 Essential (primary) hypertension: Secondary | ICD-10-CM | POA: Diagnosis not present

## 2022-12-09 DIAGNOSIS — Z85118 Personal history of other malignant neoplasm of bronchus and lung: Secondary | ICD-10-CM | POA: Diagnosis not present

## 2022-12-09 DIAGNOSIS — K648 Other hemorrhoids: Secondary | ICD-10-CM | POA: Insufficient documentation

## 2022-12-09 HISTORY — PX: POLYPECTOMY: SHX5525

## 2022-12-09 HISTORY — PX: BIOPSY: SHX5522

## 2022-12-09 HISTORY — PX: COLONOSCOPY WITH PROPOFOL: SHX5780

## 2022-12-09 HISTORY — PX: HOT HEMOSTASIS: SHX5433

## 2022-12-09 LAB — GLUCOSE, CAPILLARY: Glucose-Capillary: 70 mg/dL (ref 70–99)

## 2022-12-09 SURGERY — COLONOSCOPY WITH PROPOFOL
Anesthesia: Monitor Anesthesia Care

## 2022-12-09 MED ORDER — PROPOFOL 10 MG/ML IV BOLUS
INTRAVENOUS | Status: DC | PRN
  Administered 2022-12-09: 10 mg via INTRAVENOUS
  Administered 2022-12-09: 50 mg via INTRAVENOUS

## 2022-12-09 MED ORDER — LIDOCAINE 2% (20 MG/ML) 5 ML SYRINGE
INTRAMUSCULAR | Status: DC | PRN
  Administered 2022-12-09: 60 mg via INTRAVENOUS

## 2022-12-09 MED ORDER — LACTATED RINGERS IV SOLN: INTRAVENOUS | Status: DC

## 2022-12-09 MED ORDER — SODIUM CHLORIDE 0.9 % IV SOLN: INTRAVENOUS | Status: DC

## 2022-12-09 MED ORDER — PROPOFOL 500 MG/50ML IV EMUL
INTRAVENOUS | Status: DC | PRN
  Administered 2022-12-09: 125 ug/kg/min via INTRAVENOUS

## 2022-12-09 MED ORDER — ONDANSETRON HCL 4 MG/2ML IJ SOLN
INTRAMUSCULAR | Status: DC | PRN
  Administered 2022-12-09: 4 mg via INTRAVENOUS

## 2022-12-09 SURGICAL SUPPLY — 22 items

## 2022-12-09 NOTE — Discharge Instructions (Signed)
YOU HAD AN ENDOSCOPIC PROCEDURE TODAY: Refer to the procedure report and other information in the discharge instructions given to you for any specific questions about what was found during the examination. If this information does not answer your questions, please call [facility] office at [phone] to clarify.  ° °YOU SHOULD EXPECT: Some feelings of bloating in the abdomen. Passage of more gas than usual. Walking can help get rid of the air that was put into your GI tract during the procedure and reduce the bloating. If you had a lower endoscopy (such as a colonoscopy or flexible sigmoidoscopy) you may notice spotting of blood in your stool or on the toilet paper. Some abdominal soreness may be present for a day or two, also. ° °DIET: Your first meal following the procedure should be a light meal and then it is ok to progress to your normal diet. A half-sandwich or bowl of soup is an example of a good first meal. Heavy or fried foods are harder to digest and may make you feel nauseous or bloated. Drink plenty of fluids but you should avoid alcoholic beverages for 24 hours. If you had a esophageal dilation, please see attached instructions for diet.   ° °ACTIVITY: Your care partner should take you home directly after the procedure. You should plan to take it easy, moving slowly for the rest of the day. You can resume normal activity the day after the procedure however YOU SHOULD NOT DRIVE, use power tools, machinery or perform tasks that involve climbing or major physical exertion for 24 hours (because of the sedation medicines used during the test).  ° °SYMPTOMS TO REPORT IMMEDIATELY: °A gastroenterologist can be reached at any hour. Please call [phone]  for any of the following symptoms:  °Following lower endoscopy (colonoscopy, flexible sigmoidoscopy) °Excessive amounts of blood in the stool  °Significant tenderness, worsening of abdominal pains  °Swelling of the abdomen that is new, acute  °Fever of 100° or  higher  °Following upper endoscopy (EGD, EUS, ERCP, esophageal dilation) °Vomiting of blood or coffee ground material  °New, significant abdominal pain  °New, significant chest pain or pain under the shoulder blades  °Painful or persistently difficult swallowing  °New shortness of breath  °Black, tarry-looking or red, bloody stools ° °FOLLOW UP:  °If any biopsies were taken you will be contacted by phone or by letter within the next 1-3 weeks. Call [phone]  if you have not heard about the biopsies in 3 weeks.  °Please also call with any specific questions about appointments or follow up tests. ° °

## 2022-12-09 NOTE — H&P (Addendum)
[facility] History and Physical   Primary Care Physician:  Jenna Broker, [name]   Reason for Procedure:   History of colon polyps  Plan:    colonoscopy     HPI: Jenna [name] is a 77 y.o. female  here for colonoscopy surveillance - 3 adenomas removed in 12/2018. Also with colonic AVM. She has a history of lung cancer treated with chemo and stable over time. 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 She states her respiratory symptoms are stable, feels at baseline today.  Patient denies any bowel symptoms at this time. She wished to have another colonoscopy prior to stopping further surveillance. Of note she does have a history of IDA and AVM noted on prior colonoscopy that was not ablated at the time (was done in office).  I have discussed risks / benefits of anesthesia and endoscopic procedure with Jenna [name] and they wish to proceed with the exams as outlined today.    Past Medical History:  Diagnosis Date   Blood transfusion without reported diagnosis    Cataract    DM (diabetes mellitus)    GERD (gastroesophageal reflux disease)    Hyperlipidemia    Hypertension    Iron deficiency anemia    NSCL ca dx'd 03/2018   Thyroid disease     Past Surgical History:  Procedure Laterality Date   CATARACT EXTRACTION Right    CHOLECYSTECTOMY     COLONOSCOPY  10/24/2009   normal rectum/1X1cm abnormal lesion in the ascending colon (bx benign). TI normal for 10cm.  Prep difficult/inadequate. f/u TCS 09/2012 recommended   COLONOSCOPY  10/13/2004   Normal rectum/Diminutive polyps, splenic flexure, cold biopsied/removed.  Remainder of colonic mucosa appeared normal.   COLONOSCOPY N/A 12/14/2012   [id number] polyp-tubular adenoma   ESOPHAGOGASTRODUODENOSCOPY  10/13/2004    Normal esophagus/ Nodular volcano like lesion in the antrum, either representing a  pancreatic rest or leiomyoma, biopsied.  Remainder of the gastric mucosa appeared  normal, normal D1-D2   ESOPHAGOGASTRODUODENOSCOPY  10/24/2009   Benign biopsies. normal esophagus/small hiatal hernia/nodular lesion antrum/distal greater curvature. duodenal AVM s/p ablation   GIVENS CAPSULE STUDY  07/27/2010    multiple arteriovenous malformations which could definitely be the contributor to her drifting hemoglobin and hematocrit   IR THORACENTESIS ASP PLEURAL SPACE W/IMG GUIDE  12/16/2020   TUBAL LIGATION     US ECHOCARDIOGRAPHY     VIDEO BRONCHOSCOPY WITH ENDOBRONCHIAL ULTRASOUND N/A 04/27/2018   Procedure: VIDEO BRONCHOSCOPY WITH ENDOBRONCHIAL ULTRASOUND;  Surgeon: Jenna Slot, [name];  Location: [facility] OR;  Service: Thoracic;  Laterality: N/A;    Prior to Admission medications   Medication Sig Start Date End Date Taking? Authorizing Provider  dapagliflozin propanediol (FARXIGA) 10 MG TABS tablet Take 1 tablet (10 mg total) by mouth daily before breakfast. 12/16/21  Yes Jenna Broker, [name]  DEXILANT 30 MG capsule Take 1 capsule (30 mg total) by mouth daily. Patient taking differently: Take 30 mg by mouth See admin instructions. Take every other day, every other week 06/17/21  Yes [name], Jenna Baldy, PA  diphenhydrAMINE (BENADRYL) 2 % cream Apply 1 application topically as needed for itching.   Yes Provider, Historical, [name]  Ferrous Sulfate (IRON) 325 (65 Fe) MG TABS Take 1 tablet (325 mg total) by mouth 2 (two) times daily with a meal. Patient taking differently: Take 1 tablet by mouth See admin instructions. Take every other week 12/25/20  Yes [name], Jenna K, DO  insulin glargine (LANTUS SOLOSTAR) 100 UNIT/ML  Solostar Pen Inject 36 Units into the skin every morning. And 31G, 5mm pen needles 1/day 10/30/21  Yes Jenna [name]  labetalol (NORMODYNE) 100 MG tablet Take 0.5 tablets (50 mg total) by mouth 2 (two) times daily. Annual appt due in May must see provider for future refills 11/08/22  Yes Jenna Broker, [name]  levothyroxine (SYNTHROID) 75 MCG tablet  Take 1 tablet (75 mcg total) by mouth daily. 11/09/22  Yes Jenna Broker, [name]  losartan (COZAAR) 50 MG tablet Take 1 tablet by mouth once daily 12/01/22  Yes Jenna Broker, [name]  Multiple Vitamin (MULTIVITAMIN) capsule Take 1 capsule by mouth daily.   Yes Provider, Historical, [name]  pravastatin (PRAVACHOL) 20 MG tablet Take 1 tablet (20 mg total) by mouth daily. Annual appt due in April must see provider for future refills 10/04/22  Yes Jenna Broker, [name]  albuterol (VENTOLIN HFA) 108 (90 Base) MCG/ACT inhaler Inhale 1-2 puffs into the lungs every 6 (six) hours as needed for wheezing or shortness of breath. 09/01/21   Long, Jenna Repress, [name]  B-D UF III MINI PEN NEEDLES 31G X 5 MM MISC Inject into the skin every morning. 07/02/21   Provider, Historical, [name]  blood glucose meter kit and supplies KIT Use up to four times daily as directed 09/28/19   [name], Jenna K, DO  fluticasone furoate-vilanterol (BREO ELLIPTA) 200-25 MCG/ACT AEPB Inhale 1 puff into the lungs daily. Patient not taking: Reported on 11/24/2022 09/14/21   Jenna Broker, [name]    Current Facility-Administered Medications  Medication Dose Route Frequency Provider Last Rate Last Admin   0.9 %  sodium chloride infusion   Intravenous Continuous Jenna [name], Jenna Rayas, [name]       lactated ringers infusion   Intravenous Continuous Jenna [name], Jenna Rayas, [name] 50 mL/hr at 12/09/22 1041 Continued from Pre-op at 12/09/22 1041    Allergies as of 10/06/2022 - Review Complete 07/20/2022  Allergen Reaction Noted   Paclitaxel Other (See Comments) 06/15/2018   Aspirin Other (See Comments)    Esomeprazole magnesium     Ace inhibitors Other (See Comments) 03/23/2018    Family History  Problem Relation Age of Onset   Diabetes Sister    Colon cancer Maternal Aunt        greater than age 1   Breast cancer Cousin    Diabetes Daughter    Diabetes Daughter    Amblyopia Neg Hx    Blindness Neg Hx    Cataracts Neg Hx    Glaucoma Neg  Hx    Macular degeneration Neg Hx    Retinal detachment Neg Hx    Strabismus Neg Hx    Retinitis pigmentosa Neg Hx    Rectal cancer Neg Hx    Stomach cancer Neg Hx    Colon polyps Neg Hx    Esophageal cancer Neg Hx     Social History   Socioeconomic History   Marital status: Single    Spouse name: Not on file   Number of children: 3   Years of education: Not on file   Highest education level: Not on file  Occupational History   Occupation: retired  Tobacco Use   Smoking status: Former    Packs/day: 0.50    Years: 55.00    Additional pack years: 0.00    Total pack years: 27.50    Types: Cigarettes    Quit date: 03/18/2018    Years since quitting: 4.7   Smokeless tobacco: Never  Tobacco comments:    smoked off and n  Vaping Use   Vaping Use: Never used  Substance and Sexual Activity   Alcohol use: No   Drug use: No   Sexual activity: Not Currently  Other Topics Concern   Not on file  Social History Narrative   Patient fully vaccinated   Social Determinants of Health   Financial Resource Strain: Low Risk  (09/30/2020)   Overall Financial Resource Strain (CARDIA)    Difficulty of Paying Living Expenses: Not hard at all  Food Insecurity: No Food Insecurity (09/30/2020)   Hunger Vital Sign    Worried About Running Out of Food in the Last Year: Never true    Ran Out of Food in the Last Year: Never true  Transportation Needs: No Transportation Needs (09/30/2020)   PRAPARE - Administrator, Civil Service (Medical): No    Lack of Transportation (Non-Medical): No  Physical Activity: Inactive (09/30/2020)   Exercise Vital Sign    Days of Exercise per Week: 0 days    Minutes of Exercise per Session: 0 min  Stress: No Stress Concern Present (09/30/2020)   [employer] of Occupational Health - Occupational Stress Questionnaire    Feeling of Stress : Not at all  Social Connections: Socially Isolated (09/30/2020)   Social Connection and Isolation Panel  [NHANES]    Frequency of Communication with Friends and Family: More than three times a week    Frequency of Social Gatherings with Friends and Family: More than three times a week    Attends Religious Services: Never    Database administrator or Organizations: No    Attends Banker Meetings: Never    Marital Status: Never married  Intimate Partner Violence: Not At Risk (09/30/2020)   Humiliation, Afraid, Rape, and Kick questionnaire    Fear of Current or Ex-Partner: No    Emotionally Abused: No    Physically Abused: No    Sexually Abused: No    Review of Systems: All other review of systems negative except as mentioned in the HPI.  Physical Exam: Vital signs BP (!) 175/48   Pulse 87   Temp (!) 97.4 F (36.3 C) (Temporal)   Resp 20   Ht  (1.651 m)   Wt 93 kg   SpO2 97%   BMI 34.11 kg/m   General:   Alert,  Well-developed, pleasant and cooperative in NAD Lungs:  Clear throughout to auscultation.   Heart:  Regular rate and rhythm Abdomen:  Soft, nontender and nondistended.   Neuro/Psych:  Alert and cooperative. Normal mood and affect. A and O x 3  Harlin Rain, [name] [facility]

## 2022-12-09 NOTE — Anesthesia Postprocedure Evaluation (Signed)
Anesthesia Post Note  Patient: Jenna [name]  Procedure(s) Performed: COLONOSCOPY WITH PROPOFOL HOT HEMOSTASIS (ARGON PLASMA COAGULATION/BICAP) POLYPECTOMY BIOPSY     Patient location during evaluation: Endoscopy Anesthesia Type: MAC Level of consciousness: awake and alert Pain management: pain level controlled Vital Signs Assessment: post-procedure vital signs reviewed and stable Respiratory status: spontaneous breathing, nonlabored ventilation, respiratory function stable and patient connected to nasal cannula oxygen Cardiovascular status: blood pressure returned to baseline and stable Postop Assessment: no apparent nausea or vomiting Anesthetic complications: no  No notable events documented.  Last Vitals:  Vitals:   12/09/22 1140 12/09/22 1150  BP: (!) 177/53 (!) 178/53  Pulse: 81 84  Resp: (!) 21 (!) 21  Temp:    SpO2: 100% 96%    Last Pain:  Vitals:   12/09/22 1150  TempSrc:   PainSc: 0-No pain                 Trevor Iha

## 2022-12-09 NOTE — Op Note (Signed)
[facility] Patient Name: Jenna [name] Procedure Date: 12/09/2022 MRN: [id number] Attending MD: Willaim Rayas. Adela Lank , MD, [id number] Date of Birth: 1945/11/21 CSN: [id number] Age: 77 Admit Type: Outpatient Procedure:                Colonoscopy Indications:              High risk colon cancer surveillance: Personal                            history of colonic polyps - adenomas removed                            12/2018, also with history of IDA in the past, on                            iron, history of AVMs Providers:                Willaim Rayas. Adela Lank, MD, Zoe Lan, RN, Leanne Lovely, Technician, Randon Goldsmith, CRNA Referring MD:              Medicines:                Monitored Anesthesia Care Complications:            No immediate complications. Estimated blood loss:                            Minimal. Estimated Blood Loss:     Estimated blood loss was minimal. Procedure:                Pre-Anesthesia Assessment:                           - Prior to the procedure, a History and Physical                            was performed, and patient medications and                            allergies were reviewed. The patient's tolerance of                            previous anesthesia was also reviewed. The risks                            and benefits of the procedure and the sedation                            options and risks were discussed with the patient.                            All questions were answered, and informed consent  was obtained. Prior Anticoagulants: The patient has                            taken no anticoagulant or antiplatelet agents. ASA                            Grade Assessment: III - A patient with severe                            systemic disease. After reviewing the risks and                            benefits, the patient was deemed in satisfactory                             condition to undergo the procedure.                           After obtaining informed consent, the colonoscope                            was passed under direct vision. Throughout the                            procedure, the patient's blood pressure, pulse, and                            oxygen saturations were monitored continuously. The                            [id number]-HQ190L ([id number]) Olympus colonoscope was                            introduced through the anus and advanced to the the                            cecum, identified by appendiceal orifice and                            ileocecal valve. The colonoscopy was performed                            without difficulty. The patient tolerated the                            procedure well. The quality of the bowel                            preparation was good. The ileocecal valve,                            appendiceal orifice, and rectum were photographed. Scope In: 11:00:14 AM Scope Out: 11:17:27 AM Scope Withdrawal Time: 0 hours 14 minutes 55 seconds  Total Procedure Duration: 0 hours  17 minutes 13 seconds  Findings:      The perianal and digital rectal examinations were normal.      A single medium-sized angiodysplastic lesion was found in the cecum.       Given the patient's history of IDA, it was treated with APC. Fulguration       to ablate the lesion to prevent bleeding by argon plasma was successful.      The ileocecal valve was lipomatous.      A 4 mm polyp was found in the sigmoid colon. The polyp was sessile. The       polyp was removed with a cold snare. Resection and retrieval were       complete.      An area of altered mucosa was found in the rectum. Could be benign       hyperplastic changes, but biopsies were taken with a cold forceps for       histology to ensure no flat adenoma.      Internal hemorrhoids were found during retroflexion.      The exam was otherwise without abnormality. Impression:                - A single colonic angiodysplastic lesion. Treated                            with argon plasma coagulation (APC).                           - Lipomatous ileocecal valve.                           - One 4 mm polyp in the sigmoid colon, removed with                            a cold snare. Resected and retrieved.                           - Altered mucosa in the rectum as outlined.                            Biopsied.                           - Internal hemorrhoids.                           - The examination was otherwise normal. Moderate Sedation:      No moderate sedation, case performed with MAC Recommendation:           - Patient has a contact number available for                            emergencies. The signs and symptoms of potential                            delayed complications were discussed with the                            patient. Return  to normal activities tomorrow.                            Written discharge instructions were provided to the                            patient.                           - Resume previous diet.                           - Continue present medications.                           - Await pathology results with further                            recommendations Procedure Code(s):        --- Professional ---                           [phone], 59, Colonoscopy, flexible; with control of                            bleeding, any method                           45385, Colonoscopy, flexible; with removal of                            tumor(s), polyp(s), or other lesion(s) by snare                            technique                           45380, 59, Colonoscopy, flexible; with biopsy,                            single or multiple Diagnosis Code(s):        --- Professional ---                           Z86.010, Personal history of colonic polyps                           K55.20, Angiodysplasia of colon without hemorrhage                            K63.89, Other specified diseases of intestine                           D12.5, Benign neoplasm of sigmoid colon                           K62.89, Other specified diseases of anus and rectum  K64.8, Other hemorrhoids CPT copyright 2022 American Medical Association. All rights reserved. The codes documented in this report are preliminary and upon coder review may  be revised to meet current compliance requirements. Viviann Spare P. Adela Lank, MD 12/09/2022 11:28:25 AM This report has been signed electronically. Number of Addenda: 0

## 2022-12-09 NOTE — Anesthesia Procedure Notes (Signed)
Procedure Name: MAC Date/Time: 12/09/2022 10:58 AM  Performed by: Ludwig Lean, CRNAPre-anesthesia Checklist: Patient identified, Emergency Drugs available, Suction available and Patient being monitored Patient Re-evaluated:Patient Re-evaluated prior to induction Oxygen Delivery Method: Simple face mask Placement Confirmation: positive ETCO2 and breath sounds checked- equal and bilateral

## 2022-12-09 NOTE — Transfer of Care (Signed)
Immediate Anesthesia Transfer of Care Note  Patient: Kamika Goodloe  Procedure(s) Performed: Procedure(s): COLONOSCOPY WITH PROPOFOL (N/A) HOT HEMOSTASIS (ARGON PLASMA COAGULATION/BICAP) (N/A) POLYPECTOMY BIOPSY  Patient Location: PACU  Anesthesia Type:MAC  Level of Consciousness: Patient easily awoken, sedated, comfortable, cooperative, following commands, responds to stimulation.   Airway & Oxygen Therapy: Patient spontaneously breathing, ventilating well, oxygen via simple oxygen mask.  Post-op Assessment: Report given to PACU RN, vital signs reviewed and stable, moving all extremities.   Post vital signs: Reviewed and stable.  Complications: No apparent anesthesia complications  Last Vitals:  Vitals Value Taken Time  BP 189/60 12/09/22 1132  Temp    Pulse 86 12/09/22 1133  Resp 21 12/09/22 1133  SpO2 100 % 12/09/22 1133  Vitals shown include unvalidated device data.  Last Pain:  Vitals:   12/09/22 1132  TempSrc:   PainSc: Asleep         Complications: No notable events documented.

## 2022-12-13 ENCOUNTER — Encounter (HOSPITAL_COMMUNITY): Payer: Self-pay | Admitting: Gastroenterology

## 2022-12-13 LAB — SURGICAL PATHOLOGY

## 2022-12-15 ENCOUNTER — Other Ambulatory Visit: Payer: Self-pay

## 2022-12-15 DIAGNOSIS — K5520 Angiodysplasia of colon without hemorrhage: Secondary | ICD-10-CM

## 2022-12-15 DIAGNOSIS — D509 Iron deficiency anemia, unspecified: Secondary | ICD-10-CM

## 2022-12-22 ENCOUNTER — Encounter: Payer: Self-pay | Admitting: Internal Medicine

## 2022-12-22 ENCOUNTER — Ambulatory Visit (INDEPENDENT_AMBULATORY_CARE_PROVIDER_SITE_OTHER): Payer: 59 | Admitting: Internal Medicine

## 2022-12-22 VITALS — BP 140/80 | HR 86 | Temp 98.4°F | Ht 65.0 in | Wt 202.0 lb

## 2022-12-22 DIAGNOSIS — Z794 Long term (current) use of insulin: Secondary | ICD-10-CM | POA: Diagnosis not present

## 2022-12-22 DIAGNOSIS — K219 Gastro-esophageal reflux disease without esophagitis: Secondary | ICD-10-CM

## 2022-12-22 DIAGNOSIS — Z7984 Long term (current) use of oral hypoglycemic drugs: Secondary | ICD-10-CM | POA: Diagnosis not present

## 2022-12-22 DIAGNOSIS — E039 Hypothyroidism, unspecified: Secondary | ICD-10-CM | POA: Diagnosis not present

## 2022-12-22 DIAGNOSIS — D509 Iron deficiency anemia, unspecified: Secondary | ICD-10-CM | POA: Diagnosis not present

## 2022-12-22 DIAGNOSIS — I10 Essential (primary) hypertension: Secondary | ICD-10-CM

## 2022-12-22 DIAGNOSIS — I700 Atherosclerosis of aorta: Secondary | ICD-10-CM | POA: Diagnosis not present

## 2022-12-22 DIAGNOSIS — E118 Type 2 diabetes mellitus with unspecified complications: Secondary | ICD-10-CM | POA: Diagnosis not present

## 2022-12-22 DIAGNOSIS — E1169 Type 2 diabetes mellitus with other specified complication: Secondary | ICD-10-CM

## 2022-12-22 DIAGNOSIS — Z0001 Encounter for general adult medical examination with abnormal findings: Secondary | ICD-10-CM | POA: Diagnosis not present

## 2022-12-22 DIAGNOSIS — E785 Hyperlipidemia, unspecified: Secondary | ICD-10-CM

## 2022-12-22 DIAGNOSIS — C342 Malignant neoplasm of middle lobe, bronchus or lung: Secondary | ICD-10-CM | POA: Diagnosis not present

## 2022-12-22 LAB — COMPREHENSIVE METABOLIC PANEL
ALT: 17 U/L (ref 0–35)
AST: 22 U/L (ref 0–37)
Albumin: 3.9 g/dL (ref 3.5–5.2)
Alkaline Phosphatase: 83 U/L (ref 39–117)
BUN: 28 mg/dL — ABNORMAL HIGH (ref 6–23)
CO2: 25 mEq/L (ref 19–32)
Calcium: 9.5 mg/dL (ref 8.4–10.5)
Chloride: 106 mEq/L (ref 96–112)
Creatinine, Ser: 1.51 mg/dL — ABNORMAL HIGH (ref 0.40–1.20)
GFR: 33.36 mL/min — ABNORMAL LOW (ref >60.00)
Glucose, Bld: 84 mg/dL (ref 70–99)
Potassium: 5.3 mEq/L — ABNORMAL HIGH (ref 3.5–5.1)
Sodium: 139 mEq/L (ref 135–145)
Total Bilirubin: 0.3 mg/dL (ref 0.2–1.2)
Total Protein: 8.2 g/dL (ref 6.0–8.3)

## 2022-12-22 LAB — CBC WITH DIFFERENTIAL/PLATELET
Basophils Absolute: 0 10*3/uL (ref 0.0–0.1)
Basophils Relative: 0.3 % (ref 0.0–3.0)
Eosinophils Absolute: 0.1 10*3/uL (ref 0.0–0.7)
Eosinophils Relative: 2.1 % (ref 0.0–5.0)
HCT: 34.5 % — ABNORMAL LOW (ref 36.0–46.0)
Hemoglobin: 10.9 g/dL — ABNORMAL LOW (ref 12.0–15.0)
Lymphocytes Relative: 11.3 % — ABNORMAL LOW (ref 12.0–46.0)
Lymphs Abs: 0.6 10*3/uL — ABNORMAL LOW (ref 0.7–4.0)
MCHC: 31.4 g/dL (ref 30.0–36.0)
MCV: 85 fl (ref 78.0–100.0)
Monocytes Absolute: 0.3 10*3/uL (ref 0.1–1.0)
Monocytes Relative: 6.1 % (ref 3.0–12.0)
Neutro Abs: 4.4 10*3/uL (ref 1.4–7.7)
Neutrophils Relative %: 80.2 % — ABNORMAL HIGH (ref 43.0–77.0)
Platelets: 222 10*3/uL (ref 150.0–400.0)
RBC: 4.06 Mil/uL (ref 3.87–5.11)
RDW: 16.7 % — ABNORMAL HIGH (ref 11.5–15.5)
WBC: 5.5 10*3/uL (ref 4.0–10.5)

## 2022-12-22 LAB — LIPID PANEL
Cholesterol: 136 mg/dL (ref 0–200)
HDL: 41.5 mg/dL (ref >39.00)
LDL Cholesterol: 74 mg/dL (ref 0–99)
NonHDL: 94.05
Total CHOL/HDL Ratio: 3
Triglycerides: 101 mg/dL (ref 0.0–149.0)
VLDL: 20.2 mg/dL (ref 0.0–40.0)

## 2022-12-22 LAB — TSH: TSH: 2.5 u[IU]/mL (ref 0.35–5.50)

## 2022-12-22 LAB — MICROALBUMIN / CREATININE URINE RATIO
Creatinine,U: 81.4 mg/dL
Microalb Creat Ratio: 37.5 mg/g — ABNORMAL HIGH (ref 0.0–30.0)
Microalb, Ur: 30.5 mg/dL — ABNORMAL HIGH (ref 0.0–1.9)

## 2022-12-22 LAB — IBC + FERRITIN
Ferritin: 27.8 ng/mL (ref 10.0–291.0)
Iron: 68 ug/dL (ref 42–145)
Saturation Ratios: 22.6 % (ref 20.0–50.0)
TIBC: 301 ug/dL (ref 250.0–450.0)
Transferrin: 215 mg/dL (ref 212.0–360.0)

## 2022-12-22 LAB — HEMOGLOBIN A1C: Hgb A1c MFr Bld: 6.9 % — ABNORMAL HIGH (ref 4.6–6.5)

## 2022-12-22 MED ORDER — LANTUS SOLOSTAR 100 UNIT/ML ~~LOC~~ SOPN: 34.0000 [IU] | PEN_INJECTOR | Freq: Every day | SUBCUTANEOUS | 11 refills | Status: DC

## 2022-12-22 NOTE — Assessment & Plan Note (Signed)
Under surveillance with oncology and some SOB s/p radiation of the lungs.

## 2022-12-22 NOTE — Assessment & Plan Note (Signed)
Checking IBC/ferritin and CBC today. Adjust as needed.

## 2022-12-22 NOTE — Assessment & Plan Note (Signed)
Flu shot yearly. Pneumonia complete. Shingrix due at pharmacy. Tetanus due at pharmacy. Colonoscopy due 2027. Mammogram due 2025, pap smear aged out and dexa  complete. Counseled about sun safety and mole surveillance. Counseled about the dangers of distracted driving. Given 10 year screening recommendations.   

## 2022-12-22 NOTE — Assessment & Plan Note (Signed)
Taking pravastatin 20 mg daily and continue.

## 2022-12-22 NOTE — Assessment & Plan Note (Signed)
Some episodes rare of low sugar in the morning. Will reduce lantus to 34 units daily and refilled. Checking HgA1c, microalbumin to creatinine ratio, lipid panel and CMP. Counseled about eye exam due in August which she has scheduled already. On statin and ARB.

## 2022-12-22 NOTE — Progress Notes (Signed)
Subjective:   Patient ID: Jenna [name], female    DOB: 1946/04/05, 77 y.o.   MRN: [id number]  HPI Here for medicare wellness and physical, no new complaints. Please see A/P for status and treatment of chronic medical problems.   Diet: DM since diabetic Physical activity: sedentary Depression/mood screen: negative Hearing: intact to whispered voice Visual acuity: grossly normal, performs annual eye exam  ADLs: capable Fall risk: none Home safety: good Cognitive evaluation: intact to orientation, naming, recall and repetition EOL planning: adv directives discussed, in place  [employer] Visit from 12/22/2022 in [facility] [place]  PHQ-2 Total Score 0       Flowsheet Row Office Visit from 12/22/2022 in [facility] [place]  PHQ-9 Total Score 0         07/07/2021    1:46 PM 09/01/2021   11:48 AM 12/16/2021   10:22 AM 07/20/2022    9:43 AM 12/22/2022    9:48 AM  Fall Risk  Falls in the past year?   0  0  Was there an injury with Fall?   0  0  Fall Risk Category Calculator   0  0  Fall Risk Category (Retired)   Low    (RETIRED) Patient Fall Risk Level Low fall risk Low fall risk  Low fall risk   Fall risk Follow up     Falls evaluation completed    I have personally reviewed and have noted 1. The patient's medical and social history - reviewed today no changes 2. Their use of alcohol, tobacco or illicit drugs 3. Their current medications and supplements 4. The patient's functional ability including ADL's, fall risks, home safety risks and hearing or visual impairment. 5. Diet and physical activities 6. Evidence for depression or mood disorders 7. Care team reviewed and updated 8.  The patient is not on an opioid pain medication and this was reviewed with patient and non-opioid pain medication options were reviewed and offered to patient, their pain treatment plan and severity was discussed with them. We  have considered referrals as appropriate for patient. Opioid risk factors were also considered and reviewed.   Patient Care Team: [name], MD as PCP - General (Internal Medicine) Corbin Ade, MD (Gastroenterology) Past Medical History:  Diagnosis Date   Blood transfusion without reported diagnosis    Cataract    DM (diabetes mellitus) (HCC)    GERD (gastroesophageal reflux disease)    Hyperlipidemia    Hypertension    Iron deficiency anemia    NSCL ca dx'd 03/2018   Thyroid disease    Past Surgical History:  Procedure Laterality Date   BIOPSY  12/09/2022   Procedure: BIOPSY;  Surgeon: Benancio Deeds, MD;  Location: [facility];  Service: Gastroenterology;;   CATARACT EXTRACTION Right    CHOLECYSTECTOMY     COLONOSCOPY  10/24/2009   normal rectum/1X1cm abnormal lesion in the ascending colon (bx benign). TI normal for 10cm.  Prep difficult/inadequate. f/u TCS 09/2012 recommended   COLONOSCOPY  10/13/2004   Normal rectum/Diminutive polyps, splenic flexure, cold biopsied/removed.  Remainder of colonic mucosa appeared normal.   COLONOSCOPY N/A 12/14/2012   WUJ:WJXBJYN polyp-tubular adenoma   COLONOSCOPY WITH PROPOFOL N/A 12/09/2022   Procedure: COLONOSCOPY WITH PROPOFOL;  Surgeon: Benancio Deeds, MD;  Location: [facility];  Service: Gastroenterology;  Laterality: N/A;   ESOPHAGOGASTRODUODENOSCOPY  10/13/2004    Normal esophagus/ Nodular volcano like lesion in the antrum,  either representing a  pancreatic rest or leiomyoma, biopsied.  Remainder of the gastric mucosa appeared normal, normal D1-D2   ESOPHAGOGASTRODUODENOSCOPY  10/24/2009   Benign biopsies. normal esophagus/small hiatal hernia/nodular lesion antrum/distal greater curvature. duodenal AVM s/p ablation   GIVENS CAPSULE STUDY  07/27/2010    multiple arteriovenous malformations which could definitely be the contributor to her drifting hemoglobin and hematocrit   HOT HEMOSTASIS N/A 12/09/2022    Procedure: HOT HEMOSTASIS (ARGON PLASMA COAGULATION/BICAP);  Surgeon: Benancio Deeds, MD;  Location: [facility];  Service: Gastroenterology;  Laterality: N/A;   IR THORACENTESIS ASP PLEURAL SPACE W/IMG GUIDE  12/16/2020   POLYPECTOMY  12/09/2022   Procedure: POLYPECTOMY;  Surgeon: Benancio Deeds, MD;  Location: [facility];  Service: Gastroenterology;;   TUBAL LIGATION     US ECHOCARDIOGRAPHY     VIDEO BRONCHOSCOPY WITH ENDOBRONCHIAL ULTRASOUND N/A 04/27/2018   Procedure: VIDEO BRONCHOSCOPY WITH ENDOBRONCHIAL ULTRASOUND;  Surgeon: Loreli Slot, MD;  Location: [facility];  Service: Thoracic;  Laterality: N/A;   Family History  Problem Relation Age of Onset   Diabetes Sister    Colon cancer Maternal Aunt        greater than age 31   Breast cancer Cousin    Diabetes Daughter    Diabetes Daughter    Amblyopia Neg Hx    Blindness Neg Hx    Cataracts Neg Hx    Glaucoma Neg Hx    Macular degeneration Neg Hx    Retinal detachment Neg Hx    Strabismus Neg Hx    Retinitis pigmentosa Neg Hx    Rectal cancer Neg Hx    Stomach cancer Neg Hx    Colon polyps Neg Hx    Esophageal cancer Neg Hx    Review of Systems  Constitutional: Negative.   HENT: Negative.    Eyes: Negative.   Respiratory:  Positive for shortness of breath. Negative for cough and chest tightness.   Cardiovascular:  Negative for chest pain, palpitations and leg swelling.  Gastrointestinal:  Negative for abdominal distention, abdominal pain, constipation, diarrhea, nausea and vomiting.  Musculoskeletal: Negative.   Skin: Negative.   Neurological: Negative.   Psychiatric/Behavioral: Negative.      Objective:  Physical Exam Constitutional:      Appearance: She is well-developed. She is obese.  HENT:     Head: Normocephalic and atraumatic.  Cardiovascular:     Rate and Rhythm: Normal rate and regular rhythm.  Pulmonary:     Effort: Pulmonary effort is normal. No respiratory distress.     Breath  sounds: Normal breath sounds. No wheezing or rales.  Abdominal:     General: Bowel sounds are normal. There is no distension.     Palpations: Abdomen is soft.     Tenderness: There is no abdominal tenderness. There is no rebound.  Musculoskeletal:     Cervical back: Normal range of motion.  Skin:    General: Skin is warm and dry.  Neurological:     Mental Status: She is alert and oriented to person, place, and time.     Coordination: Coordination normal.     Vitals:   12/22/22 0942 12/22/22 0947  BP: (!) 140/80 (!) 140/80  Pulse: 86   Temp: 98.4 F (36.9 C)   TempSrc: Oral   SpO2: 97%   Weight: 202 lb (91.6 kg)   Height: 5\' 5"  (1.651 m)     Assessment & Plan:

## 2022-12-22 NOTE — Assessment & Plan Note (Signed)
Checking lipid panel and adjust pravastatin 20 mg daily for LDL goal <100. 

## 2022-12-22 NOTE — Assessment & Plan Note (Signed)
Checking TSH and adjust synthroid 75 mcg daily as needed.  

## 2022-12-22 NOTE — Assessment & Plan Note (Signed)
BP at goal on losartan 50 mg daily and labetalol 50 mg BID and checking CMP and adjust as needed.

## 2022-12-22 NOTE — Assessment & Plan Note (Signed)
Taking dexilant 30 mg daily and she is unsure if this is causing side effects.

## 2022-12-23 ENCOUNTER — Telehealth: Payer: Self-pay | Admitting: Gastroenterology

## 2022-12-23 NOTE — Telephone Encounter (Signed)
Inbound call from patient wishing to speak with Jan about a appointment regarding her colonoscopy on 04/25. States it is very important that she is called back. Please advise, thank you.

## 2022-12-23 NOTE — Telephone Encounter (Signed)
Patient sees that she has an upcoming appointment with Dr.John Leonides Schanz at the [facility] next month and wants to know why.  Someone told her Dr. Adela Lank must have referred her for cancerous polyp.  Patient was informed that Dr. Adela Lank did not refer her to the cancer center. That appointment was made on 07-20-22 at her last visit at the [facility] with Heilingoetter, Cassandra L, PA-C.  I instructed patient to call their office at [phone] and inquire about the appointment with Dr. Jeanie Sewer in June. Patient expressed understanding and was appreciative of the help

## 2022-12-27 ENCOUNTER — Other Ambulatory Visit: Payer: Self-pay | Admitting: Physician Assistant

## 2023-01-16 ENCOUNTER — Other Ambulatory Visit: Payer: Self-pay | Admitting: Internal Medicine

## 2023-01-17 ENCOUNTER — Other Ambulatory Visit: Payer: Self-pay | Admitting: Internal Medicine

## 2023-01-19 ENCOUNTER — Ambulatory Visit (HOSPITAL_COMMUNITY)
Admission: RE | Admit: 2023-01-19 | Discharge: 2023-01-19 | Disposition: A | Discharge status: Home / Self Care | Payer: 59 | Source: Ambulatory Visit | Attending: Physician Assistant | Admitting: Physician Assistant

## 2023-01-19 ENCOUNTER — Inpatient Hospital Stay: Payer: 59

## 2023-01-19 ENCOUNTER — Inpatient Hospital Stay: Payer: 59 | Attending: Physician Assistant

## 2023-01-19 ENCOUNTER — Other Ambulatory Visit: Payer: Self-pay

## 2023-01-19 ENCOUNTER — Other Ambulatory Visit: Payer: Self-pay | Admitting: Physician Assistant

## 2023-01-19 DIAGNOSIS — Z9221 Personal history of antineoplastic chemotherapy: Secondary | ICD-10-CM | POA: Insufficient documentation

## 2023-01-19 DIAGNOSIS — Z85118 Personal history of other malignant neoplasm of bronchus and lung: Secondary | ICD-10-CM | POA: Insufficient documentation

## 2023-01-19 DIAGNOSIS — N189 Chronic kidney disease, unspecified: Secondary | ICD-10-CM | POA: Insufficient documentation

## 2023-01-19 DIAGNOSIS — E1122 Type 2 diabetes mellitus with diabetic chronic kidney disease: Secondary | ICD-10-CM | POA: Insufficient documentation

## 2023-01-19 DIAGNOSIS — Z79899 Other long term (current) drug therapy: Secondary | ICD-10-CM | POA: Insufficient documentation

## 2023-01-19 DIAGNOSIS — J90 Pleural effusion, not elsewhere classified: Secondary | ICD-10-CM | POA: Diagnosis not present

## 2023-01-19 DIAGNOSIS — R918 Other nonspecific abnormal finding of lung field: Secondary | ICD-10-CM | POA: Diagnosis not present

## 2023-01-19 DIAGNOSIS — Z923 Personal history of irradiation: Secondary | ICD-10-CM | POA: Insufficient documentation

## 2023-01-19 DIAGNOSIS — J439 Emphysema, unspecified: Secondary | ICD-10-CM | POA: Diagnosis not present

## 2023-01-19 DIAGNOSIS — Z794 Long term (current) use of insulin: Secondary | ICD-10-CM | POA: Insufficient documentation

## 2023-01-19 DIAGNOSIS — D509 Iron deficiency anemia, unspecified: Secondary | ICD-10-CM | POA: Insufficient documentation

## 2023-01-19 DIAGNOSIS — E875 Hyperkalemia: Secondary | ICD-10-CM

## 2023-01-19 DIAGNOSIS — C3490 Malignant neoplasm of unspecified part of unspecified bronchus or lung: Secondary | ICD-10-CM

## 2023-01-19 DIAGNOSIS — I129 Hypertensive chronic kidney disease with stage 1 through stage 4 chronic kidney disease, or unspecified chronic kidney disease: Secondary | ICD-10-CM | POA: Insufficient documentation

## 2023-01-19 DIAGNOSIS — Z7951 Long term (current) use of inhaled steroids: Secondary | ICD-10-CM | POA: Insufficient documentation

## 2023-01-19 DIAGNOSIS — J432 Centrilobular emphysema: Secondary | ICD-10-CM | POA: Insufficient documentation

## 2023-01-19 DIAGNOSIS — Z7984 Long term (current) use of oral hypoglycemic drugs: Secondary | ICD-10-CM | POA: Insufficient documentation

## 2023-01-19 LAB — CBC WITH DIFFERENTIAL (CANCER CENTER ONLY)
Abs Immature Granulocytes: 0.02 10*3/uL (ref 0.00–0.07)
Basophils Absolute: 0 10*3/uL (ref 0.0–0.1)
Basophils Relative: 1 %
Eosinophils Absolute: 0.1 10*3/uL (ref 0.0–0.5)
Eosinophils Relative: 1 %
HCT: 33.5 % — ABNORMAL LOW (ref 36.0–46.0)
Hemoglobin: 10.2 g/dL — ABNORMAL LOW (ref 12.0–15.0)
Immature Granulocytes: 0 %
Lymphocytes Relative: 10 %
Lymphs Abs: 0.5 10*3/uL — ABNORMAL LOW (ref 0.7–4.0)
MCH: 26.8 pg (ref 26.0–34.0)
MCHC: 30.4 g/dL (ref 30.0–36.0)
MCV: 87.9 fL (ref 80.0–100.0)
Monocytes Absolute: 0.3 10*3/uL (ref 0.1–1.0)
Monocytes Relative: 6 %
Neutro Abs: 4.3 10*3/uL (ref 1.7–7.7)
Neutrophils Relative %: 82 %
Platelet Count: 198 10*3/uL (ref 150–400)
RBC: 3.81 MIL/uL — ABNORMAL LOW (ref 3.87–5.11)
RDW: 16.3 % — ABNORMAL HIGH (ref 11.5–15.5)
WBC Count: 5.2 10*3/uL (ref 4.0–10.5)
nRBC: 0 % (ref 0.0–0.2)

## 2023-01-19 LAB — CMP (CANCER CENTER ONLY)
ALT: 23 U/L (ref 0–44)
AST: 33 U/L (ref 15–41)
Albumin: 3.9 g/dL (ref 3.5–5.0)
Alkaline Phosphatase: 90 U/L (ref 38–126)
Anion gap: 7 (ref 5–15)
BUN: 22 mg/dL (ref 8–23)
CO2: 24 mmol/L (ref 22–32)
Calcium: 9.5 mg/dL (ref 8.9–10.3)
Chloride: 108 mmol/L (ref 98–111)
Creatinine: 1.33 mg/dL — ABNORMAL HIGH (ref 0.44–1.00)
GFR, Estimated: 41 mL/min — ABNORMAL LOW (ref >60)
Glucose, Bld: 167 mg/dL — ABNORMAL HIGH (ref 70–99)
Potassium: 5.4 mmol/L — ABNORMAL HIGH (ref 3.5–5.1)
Sodium: 139 mmol/L (ref 135–145)
Total Bilirubin: 0.4 mg/dL (ref 0.3–1.2)
Total Protein: 8 g/dL (ref 6.5–8.1)

## 2023-01-19 NOTE — Progress Notes (Signed)
[facility] OFFICE PROGRESS NOTE  Jenna Broker, MD [address] [place] [address]  DIAGNOSIS: Stage IIIB (T3, N3, M0) non-small cell lung cancer, squamous cell carcinoma presented with large right middle lobe lung breast in addition to mediastinal and bilateral hilar lymphadenopathy and suspicious right supraclavicular lymph node diagnosed in August 2019   PRIOR THERAPY: 1) A course of concurrent chemoradiation with weekly carboplatin for AUC of 2 and paclitaxel 45 MG/M2.  First dose of chemotherapy given on 05/29/2018.  Status post 5 cycles. 2) Consolidation treatment with immunotherapy with Imfinzi 10 mg/KG every 2 weeks.  First dose August 10, 2018.  Status post 26 cycles  CURRENT THERAPY: Observation   INTERVAL HISTORY: Jenna [name] 77 y.o. female returns to the clinic today for a follow-up visit.  The patient was last seen 6 months ago by Dr. Arbutus Ped and myself. Her last scan showed some subcentimeter right axillary lymph nodes. She has been on observation since 2020. She denies any changes in her health since last being seen except she reports she was supposed to follow up with pulmonary medicine in December 2023 but has not followed up due to too many appointments (recently had colonoscopy).  She reports since April/May, she reports some increased dyspnea on exertion.  She does not use any inhalers or rescue inhalers because they "do not work".  She also has a dry cough.  The patient is not a current smoker.  She denies any chest pain or hemoptysis.  She reports that sometimes when she lays on her left side it feels like a "balloon full of water" is in her lung.  Denies any fever, chills, night sweats, or unexplained weight loss.  Denies any nausea, vomiting, or diarrhea.  She sometimes has constipation but she eats a lot of fruit which helps with her constipation.  She supposed be taking an iron supplement for iron deficiency anemia.  She mentions  during her colonoscopy that her gastroenterologist saw a "tear" in her intestines which was felt to be an etiology of her anemia.  She does not take her iron routinely because she forgets to take it.  Her potassium was a little high last week.  She currently takes a multivitamin.  She denies excessive intake of potassium rich food. Denies any headache or visual changes.  She recently had a restaging CT scan performed.  She is here today for evaluation to review her scan results.    MEDICAL HISTORY: Past Medical History:  Diagnosis Date   Blood transfusion without reported diagnosis    Cataract    DM (diabetes mellitus) (HCC)    GERD (gastroesophageal reflux disease)    Hyperlipidemia    Hypertension    Iron deficiency anemia    NSCL ca dx'd 03/2018   Thyroid disease     ALLERGIES:  is allergic to paclitaxel, aspirin, esomeprazole magnesium, and ace inhibitors.  MEDICATIONS:  Current Outpatient Medications  Medication Sig Dispense Refill   albuterol (VENTOLIN HFA) 108 (90 Base) MCG/ACT inhaler Inhale 1-2 puffs into the lungs every 6 (six) hours as needed for wheezing or shortness of breath. 6.7 g 0   B-D UF III MINI PEN NEEDLES 31G X 5 MM MISC Inject into the skin every morning.     blood glucose meter kit and supplies KIT Use up to four times daily as directed 1 each 0   dapagliflozin propanediol (FARXIGA) 10 MG TABS tablet Take 1 tablet (10 mg total) by mouth daily before  breakfast. 90 tablet 3   DEXILANT 30 MG capsule Take 1 capsule (30 mg total) by mouth daily. (Patient taking differently: Take 30 mg by mouth See admin instructions. Take every other day, every other week) 90 capsule 3   diphenhydrAMINE (BENADRYL) 2 % cream Apply 1 application topically as needed for itching.     Ferrous Sulfate (IRON) 325 (65 Fe) MG TABS Take 1 tablet (325 mg total) by mouth 2 (two) times daily with a meal. (Patient taking differently: Take 1 tablet by mouth See admin instructions. Take every other  week) 180 tablet 0   fluticasone furoate-vilanterol (BREO ELLIPTA) 200-25 MCG/ACT AEPB Inhale 1 puff into the lungs daily. 30 each 3   insulin glargine (LANTUS SOLOSTAR) 100 UNIT/ML Solostar Pen Inject 34 Units into the skin daily. And 31G, 5mm pen needles 1/day 30 mL 11   labetalol (NORMODYNE) 100 MG tablet Take 0.5 tablets (50 mg total) by mouth 2 (two) times daily. Annual appt due in May must see provider for future refills 90 tablet 0   levothyroxine (SYNTHROID) 75 MCG tablet Take 1 tablet by mouth once daily 90 tablet 3   losartan (COZAAR) 50 MG tablet Take 1 tablet by mouth once daily 90 tablet 0   Multiple Vitamin (MULTIVITAMIN) capsule Take 1 capsule by mouth daily.     pravastatin (PRAVACHOL) 20 MG tablet Take 1 tablet (20 mg total) by mouth daily. 90 tablet 3   No current facility-administered medications for this visit.    SURGICAL HISTORY:  Past Surgical History:  Procedure Laterality Date   BIOPSY  12/09/2022   Procedure: BIOPSY;  Surgeon: Benancio Deeds, MD;  Location: [facility];  Service: Gastroenterology;;   CATARACT EXTRACTION Right    CHOLECYSTECTOMY     COLONOSCOPY  10/24/2009   normal rectum/1X1cm abnormal lesion in the ascending colon (bx benign). TI normal for 10cm.  Prep difficult/inadequate. f/u TCS 09/2012 recommended   COLONOSCOPY  10/13/2004   Normal rectum/Diminutive polyps, splenic flexure, cold biopsied/removed.  Remainder of colonic mucosa appeared normal.   COLONOSCOPY N/A 12/14/2012   YNW:GNFAOZH polyp-tubular adenoma   COLONOSCOPY WITH PROPOFOL N/A 12/09/2022   Procedure: COLONOSCOPY WITH PROPOFOL;  Surgeon: Benancio Deeds, MD;  Location: [facility];  Service: Gastroenterology;  Laterality: N/A;   ESOPHAGOGASTRODUODENOSCOPY  10/13/2004    Normal esophagus/ Nodular volcano like lesion in the antrum, either representing a  pancreatic rest or leiomyoma, biopsied.  Remainder of the gastric mucosa appeared normal, normal D1-D2    ESOPHAGOGASTRODUODENOSCOPY  10/24/2009   Benign biopsies. normal esophagus/small hiatal hernia/nodular lesion antrum/distal greater curvature. duodenal AVM s/p ablation   GIVENS CAPSULE STUDY  07/27/2010    multiple arteriovenous malformations which could definitely be the contributor to her drifting hemoglobin and hematocrit   HOT HEMOSTASIS N/A 12/09/2022   Procedure: HOT HEMOSTASIS (ARGON PLASMA COAGULATION/BICAP);  Surgeon: Benancio Deeds, MD;  Location: [facility];  Service: Gastroenterology;  Laterality: N/A;   IR THORACENTESIS ASP PLEURAL SPACE W/IMG GUIDE  12/16/2020   POLYPECTOMY  12/09/2022   Procedure: POLYPECTOMY;  Surgeon: Benancio Deeds, MD;  Location: [facility];  Service: Gastroenterology;;   TUBAL LIGATION     US ECHOCARDIOGRAPHY     VIDEO BRONCHOSCOPY WITH ENDOBRONCHIAL ULTRASOUND N/A 04/27/2018   Procedure: VIDEO BRONCHOSCOPY WITH ENDOBRONCHIAL ULTRASOUND;  Surgeon: Loreli Slot, MD;  Location: [facility] OR;  Service: Thoracic;  Laterality: N/A;    REVIEW OF SYSTEMS:   Review of Systems  Constitutional: Positive for fatigue.  Negative for appetite  change, chills, fever and unexpected weight change.  HENT: Negative for mouth sores, nosebleeds, sore throat and trouble swallowing.   Eyes: Negative for eye problems and icterus.  Respiratory: Positive for dyspnea on exertion and dry cough.  Negative for hemoptysis and wheezing.   Cardiovascular: Negative for chest pain and leg swelling.  Gastrointestinal: Positive for occasional constipation.  Negative for abdominal pain,  diarrhea, nausea and vomiting.  Genitourinary: Negative for bladder incontinence, difficulty urinating, dysuria, frequency and hematuria.   Musculoskeletal: Negative for back pain, gait problem, neck pain and neck stiffness.  Skin: Negative for itching and rash.  Neurological: Negative for dizziness, extremity weakness, gait problem, headaches, light-headedness and seizures.  Hematological:  Negative for adenopathy. Does not bruise/bleed easily.  Psychiatric/Behavioral: Negative for confusion, depression and sleep disturbance. The patient is not nervous/anxious.     PHYSICAL EXAMINATION:  Blood pressure 139/68, pulse 87, temperature 98.1 F (36.7 C), temperature source Oral, resp. rate (!) 22, height 5\' 5"  (1.651 m), weight 201 lb 11.2 oz (91.5 kg), SpO2 95 %.  ECOG PERFORMANCE STATUS: 1  Physical Exam  Constitutional: Oriented to person, place, and time and well-developed, well-nourished, and in no distress.  HENT:  Head: Normocephalic and atraumatic.  Mouth/Throat: Oropharynx is clear and moist. No oropharyngeal exudate.  Eyes: Conjunctivae are normal. Right eye exhibits no discharge. Left eye exhibits no discharge. No scleral icterus.  Neck: Normal range of motion. Neck supple.  Cardiovascular: Normal rate, regular rhythm, normal heart sounds and intact distal pulses.   Pulmonary/Chest: Effort normal.  Quiet breath sounds to right lung.  No respiratory distress. No rales.  Abdominal: Soft. Bowel sounds are normal. Exhibits no distension and no mass. There is no tenderness.  Musculoskeletal: Normal range of motion. Exhibits no edema.  Lymphadenopathy:    No cervical adenopathy.  Neurological: Alert and oriented to person, place, and time. Exhibits normal muscle tone. Gait normal. Coordination normal.  Skin: Skin is warm and dry. No rash noted. Not diaphoretic. No erythema. No pallor.  Psychiatric: Mood, memory and judgment normal.  Vitals reviewed.  LABORATORY DATA: Lab Results  Component Value Date   WBC 5.2 01/19/2023   HGB 10.2 (L) 01/19/2023   HCT 33.5 (L) 01/19/2023   MCV 87.9 01/19/2023   PLT 198 01/19/2023      Chemistry      Component Value Date/Time   NA 139 01/19/2023 1110   K 5.4 (H) 01/19/2023 1110   CL 108 01/19/2023 1110   CO2 24 01/19/2023 1110   BUN 22 01/19/2023 1110   BUN 24 (A) 02/12/2020 0000   CREATININE 1.33 (H) 01/19/2023 1110    CREATININE 0.72 12/10/2011 0919      Component Value Date/Time   CALCIUM 9.5 01/19/2023 1110   ALKPHOS 90 01/19/2023 1110   ALKPHOS 88 12/10/2011 0919   AST 33 01/19/2023 1110   ALT 23 01/19/2023 1110   BILITOT 0.4 01/19/2023 1110       RADIOGRAPHIC STUDIES:  CT Chest Wo Contrast  Result Date: 01/21/2023 CLINICAL DATA:  Non-small cell lung cancer; * Tracking Code: BO * EXAM: CT CHEST WITHOUT CONTRAST TECHNIQUE: Multidetector CT imaging of the chest was performed following the standard protocol without IV contrast. RADIATION DOSE REDUCTION: This exam was performed according to the departmental dose-optimization program which includes automated exposure control, adjustment of the mA and/or kV according to patient size and/or use of iterative reconstruction technique. COMPARISON:  Chest CT dated Jan 04, 2022 FINDINGS: Cardiovascular: Normal heart size. No pericardial effusion.  Normal caliber thoracic aorta with severe calcified plaque. Severe coronary artery calcifications. Mediastinum/Nodes: Esophagus and thyroid are unremarkable. Stable mildly enlarged right retrocrural lymph node measuring 1.2 cm in short axis on series 2 image 127. Prominent subcentimeter right axillary lymph nodes are stable. No enlarged mediastinal or hilar lymph nodes. Lungs/Pleura: Mild centrilobular emphysema. Right perihilar posttreatment change with new occlusion of the distal right middle lobe bronchus. Stable moderate loculated right pleural effusion. Numerous scattered small ground-glass and solid pulmonary nodules are stable. Stable solid left lower lobe pulmonary nodule measuring 4 mm on series 6, image 85. Peribronchovascular reticular opacities of the left lower lobe, likely due to scarring. Upper Abdomen: No acute abnormality. Musculoskeletal: Mild osseous irregularity of the sternum, unchanged when compared with the prior exam. No aggressive appearing osseous lesions. IMPRESSION: 1. Right perihilar posttreatment  change with new occlusion of the distal right middle lobe bronchus. Findings are possibly due to aspiration, although progressive disease can also have this appearance. Recommend further evaluation with short-term follow-up chest CT in 3 months or PET-CT. 2. Stable moderate loculated right pleural effusion. 3. Numerous scattered small ground-glass and solid pulmonary nodules, unchanged when compared to prior exam. Recommend continued attention on follow-up. 4. Aortic Atherosclerosis (ICD10-I70.0) and Emphysema (ICD10-J43.9). Electronically Signed   By: Allegra Lai M.D.   On: 01/21/2023 16:37     ASSESSMENT/PLAN:  This is a very pleasant 77 year old African-American female diagnosed with stage IIIb non-small cell lung cancer, squamous cell carcinoma. She presented with a large right middle lobe lung mass in addition to mediastinal and bilateral hilar lymphadenopathy.  She also has suspicious right supraclavicular lymphadenopathy.  She was diagnosed in August 2019.    She previously underwent concurrent chemoradiation with carboplatin and paclitaxel.  She is status post 5 cycles with a partial response.  She tolerated treatment fairly well except for pancytopenia and fatigue.  She completed 26 cycles of consolidation immunotherapy with Imfinzi 10 mg/kg IV every 2 weeks.  This was completed in 2020   She has been on observation since that time  The patient recently had a restaging CT scan performed.  Dr. Arbutus Ped personally and independently reviewed her scan and discussed results with the patient today.  Scan shows right perihilar posttreatment change with new occlusion of the distal right middle lobe bronchus which could be related to aspiration although progressive disease could have similar appearance.  Dr. Arbutus Ped recommend having the patient follow-up with pulmonary medicine for evaluation for her breathing and if bronchoscopy is needed at this time.  Patient denies any aspiration or odynophagia  or dysphagia.  We will call the pulmonary office to help facilitate scheduling an appointment as the patient is not always reliable. Dr. Arbutus Ped did not recommend PET scan at this time. He recommended restating CT in 3 months for re-evaluation.   I have ordered this without contrast due to her CKD.    Will see her back for follow-up visit a few days after her scan to review the results in 3 months.   Instructed her to continue taking her iron as she still is anemic. I will reacheck her iron studies at her next lab appointment. Her potassium was a little high a few days ago. She was instructed to decrease her intake of potassium rich food a few days ago.  We will recheck her BMP today.  I also advised her to avoid any multivitamins at this time and to avoid potassium rich food.  I also sent her a rescue inhaler in  case she develops any breathing issues until she sees pulmonary medicine.  The patient states that she has not taken any of her prior prescribed inhalers due to them not being effective.  The patient was advised to call immediately if she has any concerning symptoms in the interval. The patient voices understanding of current disease status and treatment options and is in agreement with the current care plan. All questions were answered. The patient knows to call the clinic with any problems, questions or concerns. We can certainly see the patient much sooner if necessary    Orders Placed This Encounter  Procedures   CT CHEST WO CONTRAST    Standing Status:   Future    Standing Expiration Date:   01/24/2024    Order Specific Question:   Preferred imaging location?    Answer:   [facility]   CBC with Differential (Cancer Center Only)    Standing Status:   Future    Standing Expiration Date:   01/24/2024   CMP (Cancer Center only)    Standing Status:   Future    Standing Expiration Date:   01/24/2024   Ambulatory referral to Pulmonology    Referral Priority:   Routine     Referral Type:   Consultation    Referral Reason:   Specialty Services Required    Requested Specialty:   Pulmonary Disease    Number of Visits Requested:   1      Shalay Carder L Menucha Dicesare, PA-C 01/24/23  ADDENDUM: Hematology/Oncology Attending: I had a face-to-face encounter with the patient today.  I reviewed her records, lab, scan and recommended her care plan.  This is a very pleasant 76 years old African-American female with a stage IIIb non-small cell lung cancer, squamous cell carcinoma diagnosed in August 2019 status post a course of concurrent chemoradiation with weekly carboplatin and paclitaxel followed by 1 year of consolidation treatment with immunotherapy. The patient is currently on observation and she is feeling fine with no concerning complaints.  She was supposed to follow with pulmonary medicine for her COPD but she was lost to follow-up. The patient had repeat CT scan of the chest performed recently.  I personally and independently reviewed the scan and this gust results with the patient today. Her scan showed right perihilar posttreatment changes with new occlusion of the distal right middle lobe bronchus and the finding possibly due to aspiration but progressive disease could have the same appearance.  Otherwise no concerning findings for disease recurrence or progression. I recommended for the patient to see her pulmonologist Dr. Celine Mans for reevaluation and consideration of bronchoscopy if needed.  In the meantime I will arrange for the patient to have repeat CT scan of the chest in 3 months to rule out any disease progression. The patient will come back for follow-up visit at that time. She was advised to call immediately if she has any other concerning symptoms in the interval. Disclaimer: This note was dictated with voice recognition software. Similar sounding words can inadvertently be transcribed and may be missed upon review. Lajuana Matte, MD

## 2023-01-20 ENCOUNTER — Telehealth: Payer: Self-pay

## 2023-01-20 NOTE — Telephone Encounter (Signed)
-----   Message from [place], PA-C sent at 01/19/2023  1:50 PM EDT ----- I am going to send a scheduling message to add a lab visit before or after my appointment on 01/24/23. Can you just call her and let her know I want to recheck her potassium. Please make sure she avoids potassium rich food and any multivitamins or supplements. Please make sure she is not taking potassium supplements.  ----- Message ----- From: Interface, Lab In [place] Sent: 01/19/2023  11:29 AM EDT To: Johnette Abraham Heilingoetter, PA-C

## 2023-01-20 NOTE — Telephone Encounter (Signed)
This nurse reached out to patient to inform her that her Potassium levels are slightly elevated. Verified that she is not taking any Potassium tablets.  Educated patient on Potassium rich foods and recommended to decrease her intake.  Advised that provider will recheck her levels at her next scheduled office visit on 01/24/23.  Patient acknowledged understanding.  No further concerns.

## 2023-01-24 ENCOUNTER — Other Ambulatory Visit: Payer: Self-pay

## 2023-01-24 ENCOUNTER — Inpatient Hospital Stay (HOSPITAL_BASED_OUTPATIENT_CLINIC_OR_DEPARTMENT_OTHER): Payer: 59 | Admitting: Physician Assistant

## 2023-01-24 ENCOUNTER — Inpatient Hospital Stay: Payer: 59

## 2023-01-24 VITALS — BP 139/68 | HR 87 | Temp 98.1°F | Resp 22 | Ht 65.0 in | Wt 201.7 lb

## 2023-01-24 DIAGNOSIS — Z9221 Personal history of antineoplastic chemotherapy: Secondary | ICD-10-CM | POA: Diagnosis not present

## 2023-01-24 DIAGNOSIS — Z794 Long term (current) use of insulin: Secondary | ICD-10-CM | POA: Diagnosis not present

## 2023-01-24 DIAGNOSIS — Z7984 Long term (current) use of oral hypoglycemic drugs: Secondary | ICD-10-CM | POA: Diagnosis not present

## 2023-01-24 DIAGNOSIS — Z7951 Long term (current) use of inhaled steroids: Secondary | ICD-10-CM | POA: Diagnosis not present

## 2023-01-24 DIAGNOSIS — D649 Anemia, unspecified: Secondary | ICD-10-CM

## 2023-01-24 DIAGNOSIS — Z85118 Personal history of other malignant neoplasm of bronchus and lung: Secondary | ICD-10-CM | POA: Diagnosis not present

## 2023-01-24 DIAGNOSIS — Z79899 Other long term (current) drug therapy: Secondary | ICD-10-CM | POA: Diagnosis not present

## 2023-01-24 DIAGNOSIS — C342 Malignant neoplasm of middle lobe, bronchus or lung: Secondary | ICD-10-CM | POA: Diagnosis not present

## 2023-01-24 DIAGNOSIS — Z923 Personal history of irradiation: Secondary | ICD-10-CM | POA: Diagnosis not present

## 2023-01-24 DIAGNOSIS — J432 Centrilobular emphysema: Secondary | ICD-10-CM | POA: Diagnosis not present

## 2023-01-24 DIAGNOSIS — N189 Chronic kidney disease, unspecified: Secondary | ICD-10-CM | POA: Diagnosis not present

## 2023-01-24 DIAGNOSIS — D509 Iron deficiency anemia, unspecified: Secondary | ICD-10-CM | POA: Diagnosis not present

## 2023-01-24 DIAGNOSIS — E1122 Type 2 diabetes mellitus with diabetic chronic kidney disease: Secondary | ICD-10-CM | POA: Diagnosis not present

## 2023-01-24 DIAGNOSIS — I129 Hypertensive chronic kidney disease with stage 1 through stage 4 chronic kidney disease, or unspecified chronic kidney disease: Secondary | ICD-10-CM | POA: Diagnosis not present

## 2023-01-24 DIAGNOSIS — E875 Hyperkalemia: Secondary | ICD-10-CM

## 2023-01-24 LAB — BASIC METABOLIC PANEL - CANCER CENTER ONLY
Anion gap: 6 (ref 5–15)
BUN: 29 mg/dL — ABNORMAL HIGH (ref 8–23)
CO2: 25 mmol/L (ref 22–32)
Calcium: 9.8 mg/dL (ref 8.9–10.3)
Chloride: 108 mmol/L (ref 98–111)
Creatinine: 1.47 mg/dL — ABNORMAL HIGH (ref 0.44–1.00)
GFR, Estimated: 37 mL/min — ABNORMAL LOW (ref >60)
Glucose, Bld: 134 mg/dL — ABNORMAL HIGH (ref 70–99)
Potassium: 5.1 mmol/L (ref 3.5–5.1)
Sodium: 139 mmol/L (ref 135–145)

## 2023-01-24 MED ORDER — ALBUTEROL SULFATE HFA 108 (90 BASE) MCG/ACT IN AERS
1.0000 | INHALATION_SPRAY | Freq: Four times a day (QID) | RESPIRATORY_TRACT | 0 refills | Status: DC | PRN
Start: 2023-01-24 — End: 2023-08-18

## 2023-01-24 NOTE — Progress Notes (Signed)
This nurse faxed referral, last office notes, most recent labs and patient demographics to [facility].  P: [phone] Fax: [phone].  No concerns at this time.

## 2023-01-25 ENCOUNTER — Ambulatory Visit: Payer: Medicare Other | Admitting: Hematology and Oncology

## 2023-01-26 ENCOUNTER — Encounter: Payer: Self-pay | Admitting: Internal Medicine

## 2023-01-27 ENCOUNTER — Ambulatory Visit (INDEPENDENT_AMBULATORY_CARE_PROVIDER_SITE_OTHER): Payer: 59 | Admitting: "Endocrinology

## 2023-01-27 ENCOUNTER — Encounter: Payer: Self-pay | Admitting: "Endocrinology

## 2023-01-27 VITALS — BP 170/70 | HR 100 | Ht 65.0 in | Wt 199.2 lb

## 2023-01-27 DIAGNOSIS — E1165 Type 2 diabetes mellitus with hyperglycemia: Secondary | ICD-10-CM

## 2023-01-27 DIAGNOSIS — Z794 Long term (current) use of insulin: Secondary | ICD-10-CM

## 2023-01-27 DIAGNOSIS — E7800 Pure hypercholesterolemia, unspecified: Secondary | ICD-10-CM | POA: Diagnosis not present

## 2023-01-27 DIAGNOSIS — Z7984 Long term (current) use of oral hypoglycemic drugs: Secondary | ICD-10-CM | POA: Diagnosis not present

## 2023-01-27 MED ORDER — DEXCOM G7 SENSOR MISC: 1.0000 | 0 refills | Status: AC

## 2023-01-27 NOTE — Progress Notes (Signed)
Outpatient Endocrinology Note Jenna Panaca, MD  01/27/23   Jenna [name] [birth year] [id number]  Referring Provider: [name], * Primary Care Provider: [name], MD Reason for consultation: Subjective   Assessment & Plan  Jenna [name] was seen today for diabetes.  Diagnoses and all orders for this visit:  Uncontrolled type 2 diabetes mellitus with hyperglycemia (HCC)  Long-term insulin use (HCC)  Long term (current) use of oral hypoglycemic drugs  Pure hypercholesterolemia  Other orders -     Continuous Glucose Sensor (DEXCOM G7 SENSOR) MISC; 1 Device by Does not apply route continuous.    Diabetes complicated by neuropathy  Hba1c goal less than 7.0, current Hba1c is 6.9. Will recommend the following: Lantus 34 units qam Farxiga 10 mg qd  No known contraindications to any of above medications  Hyperlipidemia -Last LDL at goal: 74 -on pravastatin 20 mg QD -Follow low fat diet and exercise   -Blood pressure goal <140/90 - Microalbumin/creatinine off goal at 37.5 - on ACE/ARB Losartan 50 mg qd  -diet changes including salt restriction -limit eating outside -counseled BP targets per standards of diabetes care -Uncontrolled blood pressure can lead to retinopathy, nephropathy and cardiovascular and atherosclerotic heart disease  Reviewed and counseled on: -A1C target -Blood sugar targets -Complications of uncontrolled diabetes  -Checking blood sugar before meals and bedtime and bring log next visit -All medications with mechanism of action and side effects -Hypoglycemia management: rule of 15's, Glucagon Emergency Kit and medical alert ID -low-carb low-fat plate-method diet -At least 20 minutes of physical activity per day -Annual dilated retinal eye exam and foot exam -compliance and follow up needs -follow up as scheduled or earlier if problem gets worse  Call if blood sugar is less than 70 or consistently above  250    Take a 15 gm snack of carbohydrate at bedtime before you go to sleep if your blood sugar is less than 100.    If you are going to fast after midnight for a test or procedure, ask your physician for instructions on how to reduce/decrease your insulin dose.    Call if blood sugar is less than 70 or consistently above 250  -Treating a low sugar by rule of 15  (15 gms of sugar every 15 min until sugar is more than 70) If you feel your sugar is low, test your sugar to be sure If your sugar is low (less than 70), then take 15 grams of a fast acting Carbohydrate (3-4 glucose tablets or glucose gel or 4 ounces of juice or regular soda) Recheck your sugar 15 min after treating low to make sure it is more than 70 If sugar is still less than 70, treat again with 15 grams of carbohydrate          Don't drive the hour of hypoglycemia  If unconscious/unable to eat or drink by mouth, use glucagon injection or nasal spray baqsimi and call 911. Can repeat again in 15 min if still unconscious.  Return in about 3 months (around 04/29/2023).   I have reviewed current medications, nurse's notes, allergies, vital signs, past medical and surgical history, family medical history, and social history for this encounter. Counseled patient on symptoms, examination findings, lab findings, imaging results, treatment decisions and monitoring and prognosis. The patient understood the recommendations and agrees with the treatment plan. All questions regarding treatment plan were fully answered.  Jenna [place], MD  01/27/23    History of Present Illness Jenna  Jenna [name] is a 77 y.o. year old female who presents for evaluation of Type 2 diabetes mellitus.  Ibis Steven was first diagnosed in 2000.   Diabetes education +  Home diabetes regimen: Lantus 34 units qam Farxiga 10 mg qd  COMPLICATIONS -  MI/Stroke, +PAD -  retinopathy +  neuropathy +  nephropathy, GFR 37  BLOOD SUGAR  DATA Doesn't check BG  Physical Exam  BP (!) 170/70   Pulse 100   Ht 5\' 5"  (1.651 m)   Wt 199 lb 3.2 oz (90.4 kg)   SpO2 96%   BMI 33.15 kg/m    Constitutional: well developed, well nourished Head: normocephalic, atraumatic Eyes: sclera anicteric, no redness Neck: supple Lungs: normal respiratory effort Neurology: alert and oriented Skin: dry, no appreciable rashes Musculoskeletal: no appreciable defects Psychiatric: normal mood and affect    Current Medications Patient's Medications  New Prescriptions   CONTINUOUS GLUCOSE SENSOR (DEXCOM G7 SENSOR) MISC    1 Device by Does not apply route continuous.  Previous Medications   ALBUTEROL (VENTOLIN HFA) 108 (90 BASE) MCG/ACT INHALER    Inhale 1-2 puffs into the lungs every 6 (six) hours as needed for wheezing or shortness of breath.   B-D UF III MINI PEN NEEDLES 31G X 5 MM MISC    Inject into the skin every morning.   BLOOD GLUCOSE METER KIT AND SUPPLIES KIT    Use up to four times daily as directed   DAPAGLIFLOZIN PROPANEDIOL (FARXIGA) 10 MG TABS TABLET    Take 1 tablet (10 mg total) by mouth daily before breakfast.   DEXILANT 30 MG CAPSULE    Take 1 capsule (30 mg total) by mouth daily.   DIPHENHYDRAMINE (BENADRYL) 2 % CREAM    Apply 1 application topically as needed for itching.   FERROUS SULFATE (IRON) 325 (65 FE) MG TABS    Take 1 tablet (325 mg total) by mouth 2 (two) times daily with a meal.   FLUTICASONE FUROATE-VILANTEROL (BREO ELLIPTA) 200-25 MCG/ACT AEPB    Inhale 1 puff into the lungs daily.   INSULIN GLARGINE (LANTUS SOLOSTAR) 100 UNIT/ML SOLOSTAR PEN    Inject 34 Units into the skin daily. And 31G, 5mm pen needles 1/day   LABETALOL (NORMODYNE) 100 MG TABLET    Take 0.5 tablets (50 mg total) by mouth 2 (two) times daily. Annual appt due in May must see provider for future refills   LEVOTHYROXINE (SYNTHROID) 75 MCG TABLET    Take 1 tablet by mouth once daily   LOSARTAN (COZAAR) 50 MG TABLET    Take 1 tablet by mouth once  daily   MULTIPLE VITAMIN (MULTIVITAMIN) CAPSULE    Take 1 capsule by mouth daily.   PRAVASTATIN (PRAVACHOL) 20 MG TABLET    Take 1 tablet (20 mg total) by mouth daily.  Modified Medications   No medications on file  Discontinued Medications   No medications on file    Allergies Allergies  Allergen Reactions   Paclitaxel Other (See Comments)    Unresponsiveness shortly after Taxol inf started 06/12/18.   Aspirin Other (See Comments)    Stomach bleeding    Esomeprazole Magnesium     UNSPECIFIED REACTION    Ace Inhibitors Other (See Comments)    Dizziness, drunk like    Past Medical History Past Medical History:  Diagnosis Date   Blood transfusion without reported diagnosis    Cataract    DM (diabetes mellitus) (HCC)    GERD (gastroesophageal reflux disease)  Hyperlipidemia    Hypertension    Iron deficiency anemia    NSCL ca dx'd 03/2018   Thyroid disease     Past Surgical History Past Surgical History:  Procedure Laterality Date   BIOPSY  12/09/2022   Procedure: BIOPSY;  Surgeon: Benancio Deeds, MD;  Location: [facility];  Service: Gastroenterology;;   CATARACT EXTRACTION Right    CHOLECYSTECTOMY     COLONOSCOPY  10/24/2009   normal rectum/1X1cm abnormal lesion in the ascending colon (bx benign). TI normal for 10cm.  Prep difficult/inadequate. f/u TCS 09/2012 recommended   COLONOSCOPY  10/13/2004   Normal rectum/Diminutive polyps, splenic flexure, cold biopsied/removed.  Remainder of colonic mucosa appeared normal.   COLONOSCOPY N/A 12/14/2012   [id number] polyp-tubular adenoma   COLONOSCOPY WITH PROPOFOL N/A 12/09/2022   Procedure: COLONOSCOPY WITH PROPOFOL;  Surgeon: Benancio Deeds, MD;  Location: [facility];  Service: Gastroenterology;  Laterality: N/A;   ESOPHAGOGASTRODUODENOSCOPY  10/13/2004    Normal esophagus/ Nodular volcano like lesion in the antrum, either representing a  pancreatic rest or leiomyoma, biopsied.  Remainder of the gastric  mucosa appeared normal, normal D1-D2   ESOPHAGOGASTRODUODENOSCOPY  10/24/2009   Benign biopsies. normal esophagus/small hiatal hernia/nodular lesion antrum/distal greater curvature. duodenal AVM s/p ablation   GIVENS CAPSULE STUDY  07/27/2010    multiple arteriovenous malformations which could definitely be the contributor to her drifting hemoglobin and hematocrit   HOT HEMOSTASIS N/A 12/09/2022   Procedure: HOT HEMOSTASIS (ARGON PLASMA COAGULATION/BICAP);  Surgeon: Benancio Deeds, MD;  Location: [facility];  Service: Gastroenterology;  Laterality: N/A;   IR THORACENTESIS ASP PLEURAL SPACE W/IMG GUIDE  12/16/2020   POLYPECTOMY  12/09/2022   Procedure: POLYPECTOMY;  Surgeon: Benancio Deeds, MD;  Location: [facility];  Service: Gastroenterology;;   TUBAL LIGATION     US ECHOCARDIOGRAPHY     VIDEO BRONCHOSCOPY WITH ENDOBRONCHIAL ULTRASOUND N/A 04/27/2018   Procedure: VIDEO BRONCHOSCOPY WITH ENDOBRONCHIAL ULTRASOUND;  Surgeon: Loreli Slot, MD;  Location: [facility];  Service: Thoracic;  Laterality: N/A;    Family History family history includes Breast cancer in her cousin; Colon cancer in her maternal aunt; Diabetes in her daughter, daughter, and sister.  Social History Social History   Socioeconomic History   Marital status: Single    Spouse name: Not on file   Number of children: 3   Years of education: Not on file   Highest education level: Not on file  Occupational History   Occupation: retired  Tobacco Use   Smoking status: Former    Packs/day: 0.50    Years: 55.00    Additional pack years: 0.00    Total pack years: 27.50    Types: Cigarettes    Quit date: 03/18/2018    Years since quitting: 4.8   Smokeless tobacco: Never   Tobacco comments:    smoked off and n  Vaping Use   Vaping Use: Never used  Substance and Sexual Activity   Alcohol use: No   Drug use: No   Sexual activity: Not Currently  Other Topics Concern   Not on file  Social History  Narrative   Patient fully vaccinated   Social Determinants of Health   Financial Resource Strain: Low Risk  (09/30/2020)   Overall Financial Resource Strain (CARDIA)    Difficulty of Paying Living Expenses: Not hard at all  Food Insecurity: No Food Insecurity (09/30/2020)   Hunger Vital Sign    Worried About Running Out of Food in the Last Year: Never  true    Ran Out of Food in the Last Year: Never true  Transportation Needs: No Transportation Needs (09/30/2020)   PRAPARE - Administrator, Civil Service (Medical): No    Lack of Transportation (Non-Medical): No  Physical Activity: Inactive (09/30/2020)   Exercise Vital Sign    Days of Exercise per Week: 0 days    Minutes of Exercise per Session: 0 min  Stress: No Stress Concern Present (09/30/2020)   [employer] of Occupational Health - Occupational Stress Questionnaire    Feeling of Stress : Not at all  Social Connections: Socially Isolated (09/30/2020)   Social Connection and Isolation Panel [NHANES]    Frequency of Communication with Friends and Family: More than three times a week    Frequency of Social Gatherings with Friends and Family: More than three times a week    Attends Religious Services: Never    Database administrator or Organizations: No    Attends Banker Meetings: Never    Marital Status: Never married  Intimate Partner Violence: Not At Risk (09/30/2020)   Humiliation, Afraid, Rape, and Kick questionnaire    Fear of Current or Ex-Partner: No    Emotionally Abused: No    Physically Abused: No    Sexually Abused: No    Lab Results  Component Value Date   HGBA1C 6.9 (H) 12/22/2022   HGBA1C 6.9 (A) 06/16/2022   HGBA1C 6.8 (A) 10/30/2021   Lab Results  Component Value Date   CHOL 136 12/22/2022   Lab Results  Component Value Date   HDL 41.50 12/22/2022   Lab Results  Component Value Date   LDLCALC 74 12/22/2022   Lab Results  Component Value Date   TRIG 101.0 12/22/2022    Lab Results  Component Value Date   CHOLHDL 3 12/22/2022   Lab Results  Component Value Date   CREATININE 1.47 (H) 01/24/2023   Lab Results  Component Value Date   GFR 33.36 (L) 12/22/2022   Lab Results  Component Value Date   MICROALBUR 30.5 (H) 12/22/2022      Component Value Date/Time   NA 139 01/24/2023 0927   K 5.1 01/24/2023 0927   CL 108 01/24/2023 0927   CO2 25 01/24/2023 0927   GLUCOSE 134 (H) 01/24/2023 0927   BUN 29 (H) 01/24/2023 0927   BUN 24 (A) 02/12/2020 0000   CREATININE 1.47 (H) 01/24/2023 0927   CREATININE 0.72 12/10/2011 0919   CALCIUM 9.8 01/24/2023 0927   PROT 8.0 01/19/2023 1110   ALBUMIN 3.9 01/19/2023 1110   ALBUMIN 4.3 12/10/2011 0919   AST 33 01/19/2023 1110   ALT 23 01/19/2023 1110   ALKPHOS 90 01/19/2023 1110   ALKPHOS 88 12/10/2011 0919   BILITOT 0.4 01/19/2023 1110   GFRNONAA 37 (L) 01/24/2023 0927   GFRAA 43 (L) 02/12/2020 0941      Latest Ref Rng & Units 01/24/2023    9:27 AM 01/19/2023   11:10 AM 12/22/2022   10:25 AM  BMP  Glucose 70 - 99 mg/dL 914  782  84   BUN 8 - 23 mg/dL 29  22  28    Creatinine 0.44 - 1.00 mg/dL 9.56  2.13  0.86   Sodium 135 - 145 mmol/L 139  139  139   Potassium 3.5 - 5.1 mmol/L 5.1  5.4  5.3   Chloride 98 - 111 mmol/L 108  108  106   CO2 22 - 32 mmol/L 25  24  25   Calcium 8.9 - 10.3 mg/dL 9.8  9.5  9.5        Component Value Date/Time   WBC 5.2 01/19/2023 1110   WBC 5.5 12/22/2022 1025   RBC 3.81 (L) 01/19/2023 1110   HGB 10.2 (L) 01/19/2023 1110   HGB 8.1 (L) 12/27/2017 1123   HCT 33.5 (L) 01/19/2023 1110   HCT 24.4 (L) 12/27/2017 1123   PLT 198 01/19/2023 1110   PLT 296 12/27/2017 1123   MCV 87.9 01/19/2023 1110   MCV 70 (L) 12/27/2017 1123   MCH 26.8 01/19/2023 1110   MCHC 30.4 01/19/2023 1110   RDW 16.3 (H) 01/19/2023 1110   RDW 17.0 (H) 12/27/2017 1123   LYMPHSABS 0.5 (L) 01/19/2023 1110   LYMPHSABS 0.8 12/27/2017 1123   MONOABS 0.3 01/19/2023 1110   EOSABS 0.1 01/19/2023 1110    EOSABS 0.1 12/27/2017 1123   BASOSABS 0.0 01/19/2023 1110   BASOSABS 0.0 12/27/2017 1123     Parts of this note may have been dictated using voice recognition software. There may be variances in spelling and vocabulary which are unintentional. Not all errors are proofread. Please notify the Thereasa Parkin if any discrepancies are noted or if the meaning of any statement is not clear.

## 2023-01-27 NOTE — Patient Instructions (Signed)

## 2023-02-11 ENCOUNTER — Other Ambulatory Visit: Payer: Self-pay | Admitting: Internal Medicine

## 2023-02-11 DIAGNOSIS — I10 Essential (primary) hypertension: Secondary | ICD-10-CM

## 2023-03-15 ENCOUNTER — Other Ambulatory Visit: Payer: Self-pay | Admitting: Internal Medicine

## 2023-03-15 DIAGNOSIS — I10 Essential (primary) hypertension: Secondary | ICD-10-CM

## 2023-03-17 ENCOUNTER — Ambulatory Visit: Payer: 59 | Admitting: Internal Medicine

## 2023-03-23 ENCOUNTER — Ambulatory Visit: Payer: 59 | Admitting: Internal Medicine

## 2023-03-24 ENCOUNTER — Ambulatory Visit: Payer: 59 | Admitting: Internal Medicine

## 2023-03-27 ENCOUNTER — Other Ambulatory Visit: Payer: Self-pay | Admitting: Internal Medicine

## 2023-03-27 DIAGNOSIS — E1165 Type 2 diabetes mellitus with hyperglycemia: Secondary | ICD-10-CM

## 2023-03-29 ENCOUNTER — Encounter: Payer: Self-pay | Admitting: Internal Medicine

## 2023-03-29 ENCOUNTER — Ambulatory Visit: Payer: 59 | Admitting: Internal Medicine

## 2023-03-29 VITALS — BP 126/76 | HR 87 | Temp 98.1°F | Ht 65.0 in | Wt 201.0 lb

## 2023-03-29 DIAGNOSIS — C3491 Malignant neoplasm of unspecified part of right bronchus or lung: Secondary | ICD-10-CM | POA: Diagnosis not present

## 2023-03-29 DIAGNOSIS — R0602 Shortness of breath: Secondary | ICD-10-CM | POA: Diagnosis not present

## 2023-03-29 DIAGNOSIS — Z87891 Personal history of nicotine dependence: Secondary | ICD-10-CM

## 2023-03-29 NOTE — H&P (View-Only) (Signed)
 Jenna [name]    [id number]    1946-04-08  Primary Care Physician:Crawford, Austin Miles, MD Date of Appointment: 03/29/2023 Established Patient Visit  Chief complaint:   Chief Complaint  Patient presents with   Follow-up    SOB and wheeze with exertion persistent.     HPI: Jenna [name] is a 77 y.o. woman with h/o tobacco use disorder and mild emphysema. 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 Also with changes of radiation fibrosis.   Interval Updates: Here for follow up. She is only on albuterol prn which helps. Worsening shortness of breath over the last few months.   Here after CT Chest in June showed worsening right lung collapse and concern for endobronchial mucus plug vs recurrence of endobronchial disease.   She has never had a bronchoscopy - her initial cancer was diagnosed with supraclavicular LN biopsy.   Continues to have chronic cough with yellowish mucus production. No blood.   I have reviewed the patient's family social and past medical history and updated as appropriate.   Past Medical History:  Diagnosis Date   Blood transfusion without reported diagnosis    Cataract    DM (diabetes mellitus) (HCC)    GERD (gastroesophageal reflux disease)    Hyperlipidemia    Hypertension    Iron deficiency anemia    NSCL ca dx'd 03/2018   Thyroid disease     Past Surgical History:  Procedure Laterality Date   BIOPSY  12/09/2022   Procedure: BIOPSY;  Surgeon: Benancio Deeds, MD;  Location: [facility];  Service: Gastroenterology;;   CATARACT EXTRACTION Right    CHOLECYSTECTOMY     COLONOSCOPY  10/24/2009   normal rectum/1X1cm abnormal lesion in the ascending colon (bx benign). TI normal for 10cm.  Prep difficult/inadequate. f/u TCS 09/2012 recommended   COLONOSCOPY  10/13/2004   Normal rectum/Diminutive polyps, splenic flexure, cold biopsied/removed.  Remainder of colonic mucosa appeared normal.    COLONOSCOPY N/A 12/14/2012   [id number] polyp-tubular adenoma   COLONOSCOPY WITH PROPOFOL N/A 12/09/2022   Procedure: COLONOSCOPY WITH PROPOFOL;  Surgeon: Benancio Deeds, MD;  Location: [facility];  Service: Gastroenterology;  Laterality: N/A;   ESOPHAGOGASTRODUODENOSCOPY  10/13/2004    Normal esophagus/ Nodular volcano like lesion in the antrum, either representing a  pancreatic rest or leiomyoma, biopsied.  Remainder of the gastric mucosa appeared normal, normal D1-D2   ESOPHAGOGASTRODUODENOSCOPY  10/24/2009   Benign biopsies. normal esophagus/small hiatal hernia/nodular lesion antrum/distal greater curvature. duodenal AVM s/p ablation   GIVENS CAPSULE STUDY  07/27/2010    multiple arteriovenous malformations which could definitely be the contributor to her drifting hemoglobin and hematocrit   HOT HEMOSTASIS N/A 12/09/2022   Procedure: HOT HEMOSTASIS (ARGON PLASMA COAGULATION/BICAP);  Surgeon: Benancio Deeds, MD;  Location: [facility];  Service: Gastroenterology;  Laterality: N/A;   IR THORACENTESIS ASP PLEURAL SPACE W/IMG GUIDE  12/16/2020   POLYPECTOMY  12/09/2022   Procedure: POLYPECTOMY;  Surgeon: Benancio Deeds, MD;  Location: [facility];  Service: Gastroenterology;;   TUBAL LIGATION     US ECHOCARDIOGRAPHY     VIDEO BRONCHOSCOPY WITH ENDOBRONCHIAL ULTRASOUND N/A 04/27/2018   Procedure: VIDEO BRONCHOSCOPY WITH ENDOBRONCHIAL ULTRASOUND;  Surgeon: Loreli Slot, MD;  Location: [facility];  Service: Thoracic;  Laterality: N/A;    Family History  Problem Relation Age of Onset   Diabetes Sister    Colon cancer Maternal Aunt  greater than age 57   Breast cancer Cousin    Diabetes Daughter    Diabetes Daughter    Amblyopia Neg Hx    Blindness Neg Hx    Cataracts Neg Hx    Glaucoma Neg Hx    Macular degeneration Neg Hx    Retinal detachment Neg Hx    Strabismus Neg Hx    Retinitis pigmentosa Neg Hx    Rectal cancer Neg Hx    Stomach cancer Neg Hx     Colon polyps Neg Hx    Esophageal cancer Neg Hx     Social History   Occupational History   Occupation: retired  Tobacco Use   Smoking status: Former    Current packs/day: 0.00    Average packs/day: 0.5 packs/day for 55.0 years (27.5 ttl pk-yrs)    Types: Cigarettes    Start date: 03/19/1963    Quit date: 03/18/2018    Years since quitting: 5.0   Smokeless tobacco: Never   Tobacco comments:    smoked off and n  Vaping Use   Vaping status: Never Used  Substance and Sexual Activity   Alcohol use: No   Drug use: No   Sexual activity: Not Currently     Physical Exam: Blood pressure 126/76, pulse 87, temperature 98.1 F (36.7 C), temperature source Oral, height 5\' 5"  (1.651 m), weight 201 lb (91.2 kg), SpO2 98%.  Gen:      No acute distress ENT:  no nasal polyps, mucus membranes moist Lungs:    No increased respiratory effort, breath sounds severely diminished right lung field.  CV:         Regular rate and rhythm; no murmurs, rubs, or gallops.  No pedal edema   Data Reviewed: Imaging: I have personally reviewed the CT Chest which shows worsening right lung atelectasis with concern for endobronchial lesion.   PFTs:     Latest Ref Rng & Units 03/15/2022   10:43 AM 05/11/2018    2:33 PM  PFT Results  FVC-Pre L 1.23    FVC-Predicted Pre % 41  71   FVC-Post L 1.13  1.77   FVC-Predicted Post % 38  73   Pre FEV1/FVC % % 80  83   Post FEV1/FCV % % 80  82   FEV1-Pre L 0.98  1.44   FEV1-Predicted Pre % 44  76   FEV1-Post L 0.91  1.46   DLCO uncorrected ml/min/mmHg 7.37  10.35   DLCO UNC% % 37  40   DLCO corrected ml/min/mmHg 7.37  12.65   DLCO COR %Predicted % 37  49   DLVA Predicted % 64  79   TLC L 3.62  4.30   TLC % Predicted % 69  82   RV % Predicted % 88  104    I have personally reviewed the patient's PFTs and show severe restriction to ventilation with reduced diffusion capacity.   Labs:  Immunization status: Immunization History  Administered Date(s)  Administered   Fluad Quad(high Dose 65+) 05/31/2019, 09/12/2020   Influenza-Unspecified 04/16/2014   PFIZER(Purple Top)SARS-COV-2 Vaccination 11/22/2019, 12/17/2019   Pneumococcal Conjugate-13 06/10/2014   Pneumococcal Polysaccharide-23 04/19/2013    External Records Personally Reviewed: oncology, primary care.   Assessment:  NSCLC Stage 3B s/p chemotherapy and radiation with concern for recurrence.  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 Now worsened in the setting of right lung collapse.  Emphysema, mild History of tobacco use, quit 2019  History of recurrent right pleural effusion Chronic cough, likely reflux  Plan/Recommendations: Fine to continue albuterol prn for now.  The imaging is highly concerning for cancer recurrence. Plan to perform bronchoscopy with BAL and possible endobronchial biopsies asap.  Reviewed risks and benefits with patient and she is agreeable to proceed.   Return to Care: Return in about 3 months (around 06/29/2023).   Durel Salts, MD Pulmonary and Critical Care Medicine [facility] Office:[phone]

## 2023-03-29 NOTE — Patient Instructions (Addendum)
Please schedule follow up scheduled with myself in 3 months.  If my schedule is not open yet, we will contact you with a reminder closer to that time. Please call [phone] if you haven't heard from [place] a month before.   Before your next visit I would like you to have: Bronchoscopy. I am going to try to schedule this for next week.   You can continue the albuterol inhaler as needed.

## 2023-03-29 NOTE — Progress Notes (Signed)
Jenna [name]    [id number]    1946-04-08  Primary Care Physician:Crawford, Austin Miles, MD Date of Appointment: 03/29/2023 Established Patient Visit  Chief complaint:   Chief Complaint  Patient presents with   Follow-up    SOB and wheeze with exertion persistent.     HPI: Jenna [name] is a 77 y.o. woman with h/o tobacco use disorder and mild emphysema. 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 Also with changes of radiation fibrosis.   Interval Updates: Here for follow up. She is only on albuterol prn which helps. Worsening shortness of breath over the last few months.   Here after CT Chest in June showed worsening right lung collapse and concern for endobronchial mucus plug vs recurrence of endobronchial disease.   She has never had a bronchoscopy - her initial cancer was diagnosed with supraclavicular LN biopsy.   Continues to have chronic cough with yellowish mucus production. No blood.   I have reviewed the patient's family social and past medical history and updated as appropriate.   Past Medical History:  Diagnosis Date   Blood transfusion without reported diagnosis    Cataract    DM (diabetes mellitus) (HCC)    GERD (gastroesophageal reflux disease)    Hyperlipidemia    Hypertension    Iron deficiency anemia    NSCL ca dx'd 03/2018   Thyroid disease     Past Surgical History:  Procedure Laterality Date   BIOPSY  12/09/2022   Procedure: BIOPSY;  Surgeon: Benancio Deeds, MD;  Location: [facility];  Service: Gastroenterology;;   CATARACT EXTRACTION Right    CHOLECYSTECTOMY     COLONOSCOPY  10/24/2009   normal rectum/1X1cm abnormal lesion in the ascending colon (bx benign). TI normal for 10cm.  Prep difficult/inadequate. f/u TCS 09/2012 recommended   COLONOSCOPY  10/13/2004   Normal rectum/Diminutive polyps, splenic flexure, cold biopsied/removed.  Remainder of colonic mucosa appeared normal.    COLONOSCOPY N/A 12/14/2012   AOZ:HYQMVHQ polyp-tubular adenoma   COLONOSCOPY WITH PROPOFOL N/A 12/09/2022   Procedure: COLONOSCOPY WITH PROPOFOL;  Surgeon: Benancio Deeds, MD;  Location: [facility];  Service: Gastroenterology;  Laterality: N/A;   ESOPHAGOGASTRODUODENOSCOPY  10/13/2004    Normal esophagus/ Nodular volcano like lesion in the antrum, either representing a  pancreatic rest or leiomyoma, biopsied.  Remainder of the gastric mucosa appeared normal, normal D1-D2   ESOPHAGOGASTRODUODENOSCOPY  10/24/2009   Benign biopsies. normal esophagus/small hiatal hernia/nodular lesion antrum/distal greater curvature. duodenal AVM s/p ablation   GIVENS CAPSULE STUDY  07/27/2010    multiple arteriovenous malformations which could definitely be the contributor to her drifting hemoglobin and hematocrit   HOT HEMOSTASIS N/A 12/09/2022   Procedure: HOT HEMOSTASIS (ARGON PLASMA COAGULATION/BICAP);  Surgeon: Benancio Deeds, MD;  Location: [facility];  Service: Gastroenterology;  Laterality: N/A;   IR THORACENTESIS ASP PLEURAL SPACE W/IMG GUIDE  12/16/2020   POLYPECTOMY  12/09/2022   Procedure: POLYPECTOMY;  Surgeon: Benancio Deeds, MD;  Location: [facility];  Service: Gastroenterology;;   TUBAL LIGATION     US ECHOCARDIOGRAPHY     VIDEO BRONCHOSCOPY WITH ENDOBRONCHIAL ULTRASOUND N/A 04/27/2018   Procedure: VIDEO BRONCHOSCOPY WITH ENDOBRONCHIAL ULTRASOUND;  Surgeon: Loreli Slot, MD;  Location: [facility] OR;  Service: Thoracic;  Laterality: N/A;    Family History  Problem Relation Age of Onset   Diabetes Sister    Colon cancer Maternal Aunt  greater than age 57   Breast cancer Cousin    Diabetes Daughter    Diabetes Daughter    Amblyopia Neg Hx    Blindness Neg Hx    Cataracts Neg Hx    Glaucoma Neg Hx    Macular degeneration Neg Hx    Retinal detachment Neg Hx    Strabismus Neg Hx    Retinitis pigmentosa Neg Hx    Rectal cancer Neg Hx    Stomach cancer Neg Hx     Colon polyps Neg Hx    Esophageal cancer Neg Hx     Social History   Occupational History   Occupation: retired  Tobacco Use   Smoking status: Former    Current packs/day: 0.00    Average packs/day: 0.5 packs/day for 55.0 years (27.5 ttl pk-yrs)    Types: Cigarettes    Start date: 03/19/1963    Quit date: 03/18/2018    Years since quitting: 5.0   Smokeless tobacco: Never   Tobacco comments:    smoked off and n  Vaping Use   Vaping status: Never Used  Substance and Sexual Activity   Alcohol use: No   Drug use: No   Sexual activity: Not Currently     Physical Exam: Blood pressure 126/76, pulse 87, temperature 98.1 F (36.7 C), temperature source Oral, height 5\' 5"  (1.651 m), weight 201 lb (91.2 kg), SpO2 98%.  Gen:      No acute distress ENT:  no nasal polyps, mucus membranes moist Lungs:    No increased respiratory effort, breath sounds severely diminished right lung field.  CV:         Regular rate and rhythm; no murmurs, rubs, or gallops.  No pedal edema   Data Reviewed: Imaging: I have personally reviewed the CT Chest which shows worsening right lung atelectasis with concern for endobronchial lesion.   PFTs:     Latest Ref Rng & Units 03/15/2022   10:43 AM 05/11/2018    2:33 PM  PFT Results  FVC-Pre L 1.23    FVC-Predicted Pre % 41  71   FVC-Post L 1.13  1.77   FVC-Predicted Post % 38  73   Pre FEV1/FVC % % 80  83   Post FEV1/FCV % % 80  82   FEV1-Pre L 0.98  1.44   FEV1-Predicted Pre % 44  76   FEV1-Post L 0.91  1.46   DLCO uncorrected ml/min/mmHg 7.37  10.35   DLCO UNC% % 37  40   DLCO corrected ml/min/mmHg 7.37  12.65   DLCO COR %Predicted % 37  49   DLVA Predicted % 64  79   TLC L 3.62  4.30   TLC % Predicted % 69  82   RV % Predicted % 88  104    I have personally reviewed the patient's PFTs and show severe restriction to ventilation with reduced diffusion capacity.   Labs:  Immunization status: Immunization History  Administered Date(s)  Administered   Fluad Quad(high Dose 65+) 05/31/2019, 09/12/2020   Influenza-Unspecified 04/16/2014   PFIZER(Purple Top)SARS-COV-2 Vaccination 11/22/2019, 12/17/2019   Pneumococcal Conjugate-13 06/10/2014   Pneumococcal Polysaccharide-23 04/19/2013    External Records Personally Reviewed: oncology, primary care.   Assessment:  NSCLC Stage 3B s/p chemotherapy and radiation with concern for recurrence.  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 Now worsened in the setting of right lung collapse.  Emphysema, mild History of tobacco use, quit 2019  History of recurrent right pleural effusion Chronic cough, likely reflux  Plan/Recommendations: Fine to continue albuterol prn for now.  The imaging is highly concerning for cancer recurrence. Plan to perform bronchoscopy with BAL and possible endobronchial biopsies asap.  Reviewed risks and benefits with patient and she is agreeable to proceed.   Return to Care: Return in about 3 months (around 06/29/2023).   Durel Salts, MD Pulmonary and Critical Care Medicine [facility] Office:[phone]

## 2023-03-30 ENCOUNTER — Encounter (HOSPITAL_COMMUNITY): Payer: Self-pay | Admitting: Pulmonary Disease

## 2023-03-30 ENCOUNTER — Other Ambulatory Visit: Payer: Self-pay

## 2023-04-04 NOTE — Anesthesia Preprocedure Evaluation (Signed)
Anesthesia Evaluation  Patient identified by MRN, date of birth, ID band Patient awake    Reviewed: Allergy & Precautions, NPO status , Patient's Chart, lab work & pertinent test results  History of Anesthesia Complications Negative for: history of anesthetic complications  Airway Mallampati: II  TM Distance: >3 FB Neck ROM: Full    Dental  (+) Edentulous Upper,    Pulmonary former smoker Lung ca   Pulmonary exam normal        Cardiovascular hypertension, Pt. on medications Normal cardiovascular exam     Neuro/Psych negative neurological ROS     GI/Hepatic Neg liver ROS,GERD  ,,  Endo/Other  diabetes, Type 2, Insulin Dependent, Oral Hypoglycemic AgentsHypothyroidism    Renal/GU Renal InsufficiencyRenal disease  negative genitourinary   Musculoskeletal negative musculoskeletal ROS (+)    Abdominal   Peds  Hematology  (+) Blood dyscrasia, anemia   Anesthesia Other Findings Day of surgery medications reviewed with patient.  Reproductive/Obstetrics negative OB ROS                              Anesthesia Physical Anesthesia Plan  ASA: 3  Anesthesia Plan: General   Post-op Pain Management: Tylenol PO (pre-op)*   Induction: Intravenous  PONV Risk Score and Plan: 3 and Propofol infusion, Dexamethasone, Ondansetron and TIVA  Airway Management Planned: Oral ETT  Additional Equipment: None  Intra-op Plan:   Post-operative Plan: Extubation in OR  Informed Consent: I have reviewed the patients History and Physical, chart, labs and discussed the procedure including the risks, benefits and alternatives for the proposed anesthesia with the patient or authorized representative who has indicated his/her understanding and acceptance.     Dental advisory given  Plan Discussed with: CRNA  Anesthesia Plan Comments:          Anesthesia Quick Evaluation

## 2023-04-05 ENCOUNTER — Encounter (HOSPITAL_COMMUNITY)
Admission: RE | Disposition: A | Discharge status: Home / Self Care | Payer: Self-pay | Source: Home / Self Care | Attending: Pulmonary Disease

## 2023-04-05 ENCOUNTER — Ambulatory Visit (HOSPITAL_COMMUNITY): Payer: 59 | Admitting: Anesthesiology

## 2023-04-05 ENCOUNTER — Ambulatory Visit (HOSPITAL_COMMUNITY)
Admission: RE | Admit: 2023-04-05 | Discharge: 2023-04-05 | Disposition: A | Discharge status: Home / Self Care | Payer: 59 | Attending: Pulmonary Disease | Admitting: Pulmonary Disease

## 2023-04-05 ENCOUNTER — Telehealth (HOSPITAL_BASED_OUTPATIENT_CLINIC_OR_DEPARTMENT_OTHER): Payer: Self-pay | Admitting: Pulmonary Disease

## 2023-04-05 ENCOUNTER — Ambulatory Visit (HOSPITAL_BASED_OUTPATIENT_CLINIC_OR_DEPARTMENT_OTHER): Payer: 59 | Admitting: Anesthesiology

## 2023-04-05 ENCOUNTER — Ambulatory Visit (HOSPITAL_COMMUNITY): Payer: 59

## 2023-04-05 DIAGNOSIS — J439 Emphysema, unspecified: Secondary | ICD-10-CM | POA: Diagnosis not present

## 2023-04-05 DIAGNOSIS — I10 Essential (primary) hypertension: Secondary | ICD-10-CM | POA: Diagnosis not present

## 2023-04-05 DIAGNOSIS — E119 Type 2 diabetes mellitus without complications: Secondary | ICD-10-CM | POA: Diagnosis not present

## 2023-04-05 DIAGNOSIS — J9819 Other pulmonary collapse: Secondary | ICD-10-CM | POA: Insufficient documentation

## 2023-04-05 DIAGNOSIS — Z85118 Personal history of other malignant neoplasm of bronchus and lung: Secondary | ICD-10-CM | POA: Insufficient documentation

## 2023-04-05 DIAGNOSIS — Z923 Personal history of irradiation: Secondary | ICD-10-CM | POA: Diagnosis not present

## 2023-04-05 DIAGNOSIS — Z48813 Encounter for surgical aftercare following surgery on the respiratory system: Secondary | ICD-10-CM | POA: Diagnosis not present

## 2023-04-05 DIAGNOSIS — J984 Other disorders of lung: Secondary | ICD-10-CM | POA: Diagnosis not present

## 2023-04-05 DIAGNOSIS — Z87891 Personal history of nicotine dependence: Secondary | ICD-10-CM | POA: Diagnosis not present

## 2023-04-05 DIAGNOSIS — J9811 Atelectasis: Secondary | ICD-10-CM | POA: Diagnosis not present

## 2023-04-05 DIAGNOSIS — Z9221 Personal history of antineoplastic chemotherapy: Secondary | ICD-10-CM | POA: Insufficient documentation

## 2023-04-05 DIAGNOSIS — R846 Abnormal cytological findings in specimens from respiratory organs and thorax: Secondary | ICD-10-CM | POA: Diagnosis not present

## 2023-04-05 DIAGNOSIS — K219 Gastro-esophageal reflux disease without esophagitis: Secondary | ICD-10-CM | POA: Diagnosis not present

## 2023-04-05 DIAGNOSIS — E039 Hypothyroidism, unspecified: Secondary | ICD-10-CM | POA: Diagnosis not present

## 2023-04-05 DIAGNOSIS — C3491 Malignant neoplasm of unspecified part of right bronchus or lung: Secondary | ICD-10-CM

## 2023-04-05 DIAGNOSIS — J90 Pleural effusion, not elsewhere classified: Secondary | ICD-10-CM | POA: Insufficient documentation

## 2023-04-05 DIAGNOSIS — J939 Pneumothorax, unspecified: Secondary | ICD-10-CM | POA: Diagnosis not present

## 2023-04-05 DIAGNOSIS — Z794 Long term (current) use of insulin: Secondary | ICD-10-CM

## 2023-04-05 DIAGNOSIS — R918 Other nonspecific abnormal finding of lung field: Secondary | ICD-10-CM | POA: Diagnosis not present

## 2023-04-05 DIAGNOSIS — C3490 Malignant neoplasm of unspecified part of unspecified bronchus or lung: Secondary | ICD-10-CM | POA: Diagnosis not present

## 2023-04-05 DIAGNOSIS — R0602 Shortness of breath: Secondary | ICD-10-CM | POA: Diagnosis present

## 2023-04-05 DIAGNOSIS — R0989 Other specified symptoms and signs involving the circulatory and respiratory systems: Secondary | ICD-10-CM | POA: Diagnosis not present

## 2023-04-05 HISTORY — DX: Pneumonia, unspecified organism: J18.9

## 2023-04-05 HISTORY — PX: BRONCHIAL BRUSHINGS: SHX5108

## 2023-04-05 HISTORY — PX: BRONCHIAL BIOPSY: SHX5109

## 2023-04-05 HISTORY — PX: THORACENTESIS: SHX235

## 2023-04-05 HISTORY — PX: BRONCHIAL WASHINGS: SHX5105

## 2023-04-05 HISTORY — DX: Dyspnea, unspecified: R06.00

## 2023-04-05 HISTORY — PX: HEMOSTASIS CONTROL: SHX6838

## 2023-04-05 HISTORY — PX: VIDEO BRONCHOSCOPY: SHX5072

## 2023-04-05 LAB — GLUCOSE, CAPILLARY
Glucose-Capillary: 37 mg/dL — CL (ref 70–99)
Glucose-Capillary: 44 mg/dL — CL (ref 70–99)
Glucose-Capillary: 197 mg/dL — ABNORMAL HIGH (ref 70–99)
Glucose-Capillary: 143 mg/dL — ABNORMAL HIGH (ref 70–99)
Glucose-Capillary: 158 mg/dL — ABNORMAL HIGH (ref 70–99)

## 2023-04-05 LAB — BODY FLUID CELL COUNT WITH DIFFERENTIAL
Lymphs, Fluid: 64 %
Monocyte-Macrophage-Serous Fluid: 21 % — ABNORMAL LOW (ref 50–90)
Neutrophil Count, Fluid: 15 % (ref 0–25)
Total Nucleated Cell Count, Fluid: UNDETERMINED cu mm (ref 0–1000)
Total Nucleated Cell Count, Fluid: 475 cu mm (ref 0–1000)

## 2023-04-05 SURGERY — VIDEO BRONCHOSCOPY WITHOUT FLUORO
Anesthesia: General

## 2023-04-05 MED ORDER — DEXTROSE 50 % IV SOLN
INTRAVENOUS | Status: AC
  Filled 2023-04-05: qty 50

## 2023-04-05 MED ORDER — FENTANYL CITRATE (PF) 250 MCG/5ML IJ SOLN
INTRAMUSCULAR | Status: DC | PRN
  Administered 2023-04-05: 100 ug via INTRAVENOUS

## 2023-04-05 MED ORDER — DEXTROSE 50 % IV SOLN
25.0000 g | INTRAVENOUS | Status: AC
  Administered 2023-04-05: 25 g via INTRAVENOUS

## 2023-04-05 MED ORDER — EPINEPHRINE PF 1 MG/ML IJ SOLN
INTRAMUSCULAR | Status: DC | PRN
  Administered 2023-04-05: 4 mL

## 2023-04-05 MED ORDER — FENTANYL CITRATE (PF) 100 MCG/2ML IJ SOLN
INTRAMUSCULAR | Status: AC
  Filled 2023-04-05: qty 2

## 2023-04-05 MED ORDER — ONDANSETRON HCL 4 MG/2ML IJ SOLN
INTRAMUSCULAR | Status: DC | PRN
Start: 2023-04-05 — End: 2023-04-05
  Administered 2023-04-05: 4 mg via INTRAVENOUS

## 2023-04-05 MED ORDER — ACETAMINOPHEN 500 MG PO TABS: 1000.0000 mg | ORAL_TABLET | Freq: Once | ORAL | Status: DC

## 2023-04-05 MED ORDER — FENTANYL CITRATE (PF) 100 MCG/2ML IJ SOLN: 25.0000 ug | INTRAMUSCULAR | Status: DC | PRN

## 2023-04-05 MED ORDER — DEXAMETHASONE SODIUM PHOSPHATE 10 MG/ML IJ SOLN
INTRAMUSCULAR | Status: DC | PRN
  Administered 2023-04-05: 4 mg via INTRAVENOUS

## 2023-04-05 MED ORDER — PROPOFOL 500 MG/50ML IV EMUL
INTRAVENOUS | Status: DC | PRN
  Administered 2023-04-05: 150 ug/kg/min via INTRAVENOUS

## 2023-04-05 MED ORDER — LIDOCAINE 2% (20 MG/ML) 5 ML SYRINGE
INTRAMUSCULAR | Status: DC | PRN
  Administered 2023-04-05: 100 mg via INTRAVENOUS

## 2023-04-05 MED ORDER — PROPOFOL 10 MG/ML IV BOLUS
INTRAVENOUS | Status: AC
  Filled 2023-04-05: qty 20

## 2023-04-05 MED ORDER — SODIUM CHLORIDE (PF) 0.9 % IJ SOLN
PREFILLED_SYRINGE | INTRAMUSCULAR | Status: DC | PRN
  Administered 2023-04-05: 4 mL

## 2023-04-05 MED ORDER — EPINEPHRINE 1 MG/ML IJ SOLN: INTRAMUSCULAR | Status: DC | PRN

## 2023-04-05 MED ORDER — LACTATED RINGERS IV SOLN
INTRAVENOUS | Status: AC | PRN
Start: 2023-04-05 — End: 2023-04-05
  Administered 2023-04-05: 1000 mL via INTRAVENOUS

## 2023-04-05 MED ORDER — DROPERIDOL 2.5 MG/ML IJ SOLN: 0.6250 mg | Freq: Once | INTRAMUSCULAR | Status: DC | PRN

## 2023-04-05 MED ORDER — SUCCINYLCHOLINE CHLORIDE 200 MG/10ML IV SOSY
PREFILLED_SYRINGE | INTRAVENOUS | Status: DC | PRN
  Administered 2023-04-05: 100 mg via INTRAVENOUS

## 2023-04-05 MED ORDER — PROPOFOL 10 MG/ML IV BOLUS
INTRAVENOUS | Status: DC | PRN
Start: 2023-04-05 — End: 2023-04-05
  Administered 2023-04-05: 170 mg via INTRAVENOUS

## 2023-04-05 NOTE — Op Note (Signed)
[facility] Cardiopulmonary Patient Name: Jenna [name] Procedure Date: 04/05/2023 MRN: [id number] Attending MD: Mechele Collin , MD, [id number] Date of Birth: 01-12-1946 CSN: [id number] Age: 77 Admit Type: Outpatient Ethnicity: Not Hispanic or Latino Procedure:             Bronchoscopy Indications:           Lung collapse Providers:             Mechele Collin, MD, Martha Clan, RN, Leanne Lovely, Technician Referring MD:           Medicines:              Complications:         Minor bleed Estimated Blood Loss:  10 cc requiring 3 cc of epi 1:10,000 Procedure:      Pre-Anesthesia Assessment:      - A History and Physical has been performed. The patient's medications,       allergies and sensitivities have been reviewed.      After obtaining informed consent, the bronchoscope was passed under       direct vision. Throughout the procedure, the patient's blood pressure,       pulse, and oxygen saturations were monitored continuously. the BF-H190       ([id number]) Olympus bronchoscope was introduced through the mouth, via the       endotracheal tube (the patient was intubated for the procedure) and       advanced to the tracheobronchial tree. The procedure was accomplished       without difficulty. The patient tolerated the procedure well. Findings:      The endotracheal tube is in good position. The visualized portion of the       trachea is of normal caliber. The carina is sharp. The tracheobronchial       tree was examined to at least the first subsegmental level. Bronchial       mucosa boggy and friable. Anatomy is normal except for narrowing of       airways bilaterally and >50% extrinsic compression of right middle lobe;       there are no endobronchial lesions however increased thickening of minor       carina and airway of right middle lobe. Scattered thick white secretions       bilaterally. BAL performed on right middle lobe  with 20 cc sample       collected for cell count, culture, fungal, AFB and cytology. Brushings x       2 with brushes sent to cytology. Endobronchial biopsies of minor carina       of RML x 3 collected for pathology. Impression:      Boggy and friable mucosa with narrowing of airways throughout      Extrinsic compression/lung collapse of RML      S/p BAL of RML      S/p brushings x 2      S/p endobronchial biopsies x 3 of minor carina of RML Moderate Sedation:      General anesthesia Recommendation:      Follow-up studies Procedure Code(s):      --- Professional ---      [id number], Bronchoscopy, rigid or flexible, including fluoroscopic guidance,       when performed; diagnostic, with cell washing, when performed (separate  procedure) Diagnosis Code(s):      --- Professional ---      U98.11, Other pulmonary collapse CPT copyright 2022 American Medical Association. All rights reserved. The codes documented in this report are preliminary and upon coder review may  be revised to meet current compliance requirements. Mechele Collin, MD 04/05/2023 9:22:28 AM This report has been signed electronically. Number of Addenda: 0 Scope In: Scope Out:

## 2023-04-05 NOTE — Progress Notes (Addendum)
Pt sat up and got dressed. Pt satting 94-95% on Room Air, however, patient is dyspneic and had dyspnea on exertion. MD Everardo All notified. Per MD Everardo All, continue to monitor pt.  Eulas Post, RN 04/05/23 10:21 AM  Addendum 1036: MD Everardo All at bedside. Phone consent obtained for thoracentesis from pt's son.  Addendum 1215: Pt satting 93-94% on room air s/p thoracentesis. Pt endorses interval improvement in work of breathing, less dyspneic than prior. MD Everardo All updated. MD Everardo All at bedside to assess.  Addendum 1238: Pt resting comfortably, saturating 92% on room air. Awaiting CXR result. Per MD Everardo All, pt may be discharged once CXR results if there is no pneumothorax noted.  Addendum 1245: CXR is back. Per radiologist's report, small R apical pneumothorax (see report). MD Everardo All notified of result. Per MD Everardo All, okay to discharge pt (see MD Ellison's progress note).  Addendum 1310: Discharge instructions reviewed with patient. Patient verbalized understanding of instructions. Pt discharged to home.

## 2023-04-05 NOTE — Anesthesia Procedure Notes (Signed)
Procedure Name: Intubation Date/Time: 04/05/2023 7:54 AM  Performed by: Elyn Peers, CRNAPre-anesthesia Checklist: Patient identified, Emergency Drugs available, Suction available, Patient being monitored and Timeout performed Patient Re-evaluated:Patient Re-evaluated prior to induction Oxygen Delivery Method: Circle system utilized Preoxygenation: Pre-oxygenation with 100% oxygen Induction Type: IV induction Ventilation: Mask ventilation without difficulty Laryngoscope Size: Miller and 3 Grade View: Grade I Tube type: Oral Tube size: 8.5 mm Number of attempts: 1 Airway Equipment and Method: Stylet Placement Confirmation: ETT inserted through vocal cords under direct vision, positive ETCO2 and breath sounds checked- equal and bilateral Secured at: 23 cm Tube secured with: Tape Dental Injury: Teeth and Oropharynx as per pre-operative assessment

## 2023-04-05 NOTE — Discharge Instructions (Addendum)
Flexible Bronchoscopy, Care After This sheet gives you information about how to care for yourself after your test. Your doctor may also give you more specific instructions. If you have problems or questions, contact your doctor. Follow these instructions at home: Eating and drinking Do not eat or drink anything (not even water) for 2 hours after your test, or until your numbing medicine (local anesthetic) wears off. When your numbness is gone and your cough and gag reflexes have come back, you may: Eat only soft foods. Slowly drink liquids. The day after the test, go back to your normal diet. Driving Do not drive for 24 hours if you were given a medicine to help you relax (sedative). Do not drive or use heavy machinery while taking prescription pain medicine. General instructions  Take over-the-counter and prescription medicines only as told by your doctor. Return to your normal activities as told. Ask what activities are safe for you. Do not use any products that have nicotine or tobacco in them. This includes cigarettes and e-cigarettes. If you need help quitting, ask your doctor. Keep all follow-up visits as told by your doctor. This is important. It is very important if you had a tissue sample (biopsy) taken. Get help right away if: You have shortness of breath that gets worse. You get light-headed. You feel like you are going to pass out (faint). You have chest pain. You cough up: More than a little blood. More blood than before. Summary Do not eat or drink anything (not even water) for 2 hours after your test, or until your numbing medicine wears off. Do not use cigarettes. Do not use e-cigarettes. Get help right away if you have chest pain.  This information is not intended to replace advice given to you by your health care provider. Make sure you discuss any questions you have with your health care provider. Document Released: 05/30/2009 Document Revised: 07/15/2017 Document  Reviewed: 08/20/2016 Elsevier Patient Education  2020 Elsevier Inc.    FOLLOW-UP APPOINTMENT SCHEDULED WITH DR. Celine Mans on 04/12/23 at 11 AM.  You will review results from bronchoscopy and also evaluate the small pneumothorax and fluid in your chest at that visit

## 2023-04-05 NOTE — Transfer of Care (Signed)
Immediate Anesthesia Transfer of Care Note  Patient: Jenna [name]  Procedure(s) Performed: VIDEO BRONCHOSCOPY WITHOUT FLUORO BRONCHIAL WASHINGS BRONCHIAL BIOPSIES BRONCHIAL BRUSHINGS  Patient Location: PACU and Endoscopy Unit  Anesthesia Type:General  Level of Consciousness: awake, drowsy, and responds to stimulation  Airway & Oxygen Therapy: Patient Spontanous Breathing and Patient connected to face mask oxygen  Post-op Assessment: Report given to RN, Post -op Vital signs reviewed and stable, and BP 191/57  Pulse 63   Pulse ox 100%  Post vital signs: Reviewed and stable  Last Vitals:  Vitals Value Taken Time  BP    Temp    Pulse    Resp    SpO2      Last Pain:  Vitals:   04/05/23 0655  TempSrc: Temporal  PainSc: 0-No pain         Complications: No notable events documented.

## 2023-04-05 NOTE — Anesthesia Postprocedure Evaluation (Signed)
Anesthesia Post Note  Patient: Jenna [name]  Procedure(s) Performed: VIDEO BRONCHOSCOPY WITHOUT FLUORO BRONCHIAL WASHINGS BRONCHIAL BIOPSIES BRONCHIAL BRUSHINGS     Patient location during evaluation: PACU Anesthesia Type: General Level of consciousness: awake and alert Pain management: pain level controlled Vital Signs Assessment: post-procedure vital signs reviewed and stable Respiratory status: spontaneous breathing, nonlabored ventilation and respiratory function stable Cardiovascular status: blood pressure returned to baseline Postop Assessment: no apparent nausea or vomiting Anesthetic complications: no   No notable events documented.  Last Vitals:  Vitals:   04/05/23 0900 04/05/23 0910  BP: 102/79 (!) 154/62  Pulse: 69 64  Resp: 20 19  Temp:    SpO2: 93% 96%    Last Pain:  Vitals:   04/05/23 0910  TempSrc:   PainSc: 0-No pain                 Shanda Howells

## 2023-04-05 NOTE — Progress Notes (Signed)
Hypoglycemic Event  CBG: 37 recheck 44  Treatment: D50 50mL 25g SEE MAR   Symptoms: None pt states and shows no signs or symptoms.   Follow-up CBG: Time: 07;52am OR RN M.K.   CBG Result:197  Possible Reasons for Event: Pt NPO for procedure.   Comments/MD notified:Dr.Howze notified IV access obtained and protocol followed per verbal order.     Alben Spittle [place]

## 2023-04-05 NOTE — Progress Notes (Addendum)
Post-Bronchoscopy 04/05/23  CXR reviewed with no post-procedure pneumothorax. Large right pleural effusion noted. Patient currently on room air with SpO2 92-94%. Remains drowsy after anesthesia. Unable to consent for additional procedure such as thoracentesis at this time. Will arrange close follow-up with plan for thoracentesis as outpatient.  Post-bronchoscopy precautions given. Also advised to contact office or urgent care for worsening respiratory symptoms. Friend is picking patient up from procedure.   ADDENDUM 10:25 AM  Patient extremely dyspneic but maintaining saturations >92%. Patient at bedside ok with procedure. I contacted her son via telephone and discussed risk and benefits of thoracentesis including bleeding, infection and lung collapse. He agreed to procedure as well. If her symptoms do not improve then she will need to be admitted.  ADDENDUM 12:45 PM Improved dyspnea after 1000 ml pleural fluid removed from right thoracentesis. Remains stable on room air with SpO2 93-94%. CXR with right apical pneumothorax, improved right pleural effusion, right basilar atelectasis. Given clinical stability, ok to discharge home with close follow-up scheduled with Dr. Celine Mans at [facility] for 04/12/23 with CXR.

## 2023-04-05 NOTE — Interval H&P Note (Signed)
History and Physical Interval Note:  04/05/2023 7:16 AM  Jenna [name]  has presented today for surgery, with the diagnosis of lung cancer.  The various methods of treatment have been discussed with the patient and family. After consideration of risks, benefits and other options for treatment, the patient has consented to  Procedure(s): VIDEO BRONCHOSCOPY WITHOUT FLUORO (N/A) as a surgical intervention.  The patient's history has been reviewed, patient examined, no change in status, stable for surgery.  I have reviewed the patient's chart and labs.  Questions were answered to the patient's satisfaction.     Jaylynn Mcaleer Mechele Collin, MD

## 2023-04-05 NOTE — Op Note (Signed)
Thoracentesis  Procedure Note  Jenna [name]  [id number]  1945/08/25  Date:04/05/23  Time:11:05 AM   Provider Performing:Joliet Mallozzi Mechele Collin, MD  Procedure: Thoracentesis with imaging guidance (40347)  Indication(s) Pleural Effusion  Consent Risks of the procedure as well as the alternatives and risks of each were explained to the patient and/or caregiver.  Consent for the procedure was obtained and is signed in the bedside chart  Anesthesia Topical only with 1% lidocaine , 8 cc   Time Out Verified patient identification, verified procedure, site/side was marked, verified correct patient position, special equipment/implants available, medications/allergies/relevant history reviewed, required imaging and test results available.   Sterile Technique Maximal sterile technique including full sterile barrier drape, hand hygiene, sterile gown, sterile gloves, mask, hair covering, sterile ultrasound probe cover (if used).  Procedure Description Ultrasound was used to identify appropriate pleural anatomy for placement and overlying skin marked.  Area of drainage cleaned and draped in sterile fashion. Lidocaine was used to anesthetize the skin and subcutaneous tissue.  1000 cc's of dark yellow appearing fluid was drained from the right pleural space. Catheter then removed and bandaid applied to site.   Complications/Tolerance None; patient tolerated the procedure well. Chest X-ray is ordered to confirm no post-procedural complication.   EBL Minimal   Specimen(s) Pleural fluid: Cell count, culture, cytology

## 2023-04-05 NOTE — Telephone Encounter (Signed)
Please schedule patient for bronchoscopy follow +/- thoracentesis on 04/12/23.  Discussed with Dr. Celine Mans. OK to overbook for 11 AM slot

## 2023-04-05 NOTE — Telephone Encounter (Signed)
Scheduled for 8/27 with ND. Nothing further needed.

## 2023-04-05 NOTE — Progress Notes (Signed)
Pt satting 92-93% on room air compared to 97% on room air pre-operatively. MD Everardo All at bedside to assess pt. Pt has appointment with MD Celine Mans for possible thoracentesis on 8/27. Per MD Everardo All, pt appropriate for discharge at this time.  Eulas Post, RN 04/05/23 10:08 AM

## 2023-04-06 ENCOUNTER — Ambulatory Visit: Payer: 59 | Admitting: Internal Medicine

## 2023-04-06 LAB — CYTOLOGY - NON PAP

## 2023-04-06 LAB — SURGICAL PATHOLOGY

## 2023-04-08 LAB — ACID FAST SMEAR (AFB, MYCOBACTERIA): Acid Fast Smear: NEGATIVE

## 2023-04-08 LAB — BODY FLUID CULTURE W GRAM STAIN: Culture: NO GROWTH

## 2023-04-09 ENCOUNTER — Encounter (HOSPITAL_COMMUNITY): Payer: Self-pay | Admitting: Pulmonary Disease

## 2023-04-12 ENCOUNTER — Encounter: Payer: Self-pay | Admitting: Internal Medicine

## 2023-04-12 ENCOUNTER — Ambulatory Visit (INDEPENDENT_AMBULATORY_CARE_PROVIDER_SITE_OTHER): Payer: 59 | Admitting: Internal Medicine

## 2023-04-12 VITALS — BP 146/64 | HR 84 | Ht 65.0 in | Wt 195.0 lb

## 2023-04-12 DIAGNOSIS — J189 Pneumonia, unspecified organism: Secondary | ICD-10-CM

## 2023-04-12 MED ORDER — AMOXICILLIN-POT CLAVULANATE 875-125 MG PO TABS: 1.0000 | ORAL_TABLET | Freq: Two times a day (BID) | ORAL | 0 refills | Status: DC

## 2023-04-12 NOTE — Patient Instructions (Addendum)
Please schedule follow up scheduled with myself in 2 months.  If my schedule is not open yet, we will contact you with a reminder closer to that time. Please call [phone] if you haven't heard from [place] a month before.   Before your next visit I would like you to have: CT scan as ordered.  I am treating you with a course of antibiotics to see if it helps. I think you have a lot of mucus stuck deep your right lung.  Keep taking the albuterol inhaler as needed.  So far the fluid and the bronchoscopy results show no evidence of cancer.

## 2023-04-12 NOTE — Progress Notes (Signed)
Jenna [name]    [id number]    09/30/1945  Primary Care Physician:Crawford, Austin Miles, MD Date of Appointment: 04/12/2023 Established Patient Visit  Chief complaint:   Chief Complaint  Patient presents with   Follow-up    Bronch f/u pt sob      HPI: Jenna [name] is a 77 y.o. woman with h/o tobacco use disorder and mild emphysema. 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 Also with changes of radiation fibrosis.   Interval Updates: Here for follow up after bronchoscopy and right sided thoracentesis. Studies negative for malignancy.   Continues to bring up thick mucus and feels sob. Taking albuterol 2-3 times/day.   No fevers or chills. Having dizziness and issues with CBG - asking [place] to check her blood sugar today.   I have reviewed the patient's family social and past medical history and updated as appropriate.   Past Medical History:  Diagnosis Date   Blood transfusion without reported diagnosis    Cataract    DM (diabetes mellitus) (HCC)    Dyspnea    GERD (gastroesophageal reflux disease)    Hyperlipidemia    Hypertension    Iron deficiency anemia    NSCL ca dx'd 03/2018   Pneumonia    Thyroid disease     Past Surgical History:  Procedure Laterality Date   BIOPSY  12/09/2022   Procedure: BIOPSY;  Surgeon: Benancio Deeds, MD;  Location: [facility];  Service: Gastroenterology;;   BRONCHIAL BIOPSY  04/05/2023   Procedure: BRONCHIAL BIOPSIES;  Surgeon: Luciano Cutter, MD;  Location: [facility];  Service: Cardiopulmonary;;   BRONCHIAL BRUSHINGS  04/05/2023   Procedure: BRONCHIAL BRUSHINGS;  Surgeon: Luciano Cutter, MD;  Location: [facility];  Service: Cardiopulmonary;;   BRONCHIAL WASHINGS  04/05/2023   Procedure: BRONCHIAL WASHINGS;  Surgeon: Luciano Cutter, MD;  Location: [facility];  Service: Cardiopulmonary;;   CATARACT EXTRACTION Right    CHOLECYSTECTOMY     COLONOSCOPY   10/24/2009   normal rectum/1X1cm abnormal lesion in the ascending colon (bx benign). TI normal for 10cm.  Prep difficult/inadequate. f/u TCS 09/2012 recommended   COLONOSCOPY  10/13/2004   Normal rectum/Diminutive polyps, splenic flexure, cold biopsied/removed.  Remainder of colonic mucosa appeared normal.   COLONOSCOPY N/A 12/14/2012   WUJ:[id number] polyp-tubular adenoma   COLONOSCOPY WITH PROPOFOL N/A 12/09/2022   Procedure: COLONOSCOPY WITH PROPOFOL;  Surgeon: Benancio Deeds, MD;  Location: [facility];  Service: Gastroenterology;  Laterality: N/A;   ESOPHAGOGASTRODUODENOSCOPY  10/13/2004    Normal esophagus/ Nodular volcano like lesion in the antrum, either representing a  pancreatic rest or leiomyoma, biopsied.  Remainder of the gastric mucosa appeared normal, normal D1-D2   ESOPHAGOGASTRODUODENOSCOPY  10/24/2009   Benign biopsies. normal esophagus/small hiatal hernia/nodular lesion antrum/distal greater curvature. duodenal AVM s/p ablation   GIVENS CAPSULE STUDY  07/27/2010    multiple arteriovenous malformations which could definitely be the contributor to her drifting hemoglobin and hematocrit   HEMOSTASIS CONTROL  04/05/2023   Procedure: HEMOSTASIS CONTROL;  Surgeon: Luciano Cutter, MD;  [facility]: [facility];  Service: Cardiopulmonary;;   HOT HEMOSTASIS N/A 12/09/2022   Procedure: HOT HEMOSTASIS (ARGON PLASMA COAGULATION/BICAP);  Surgeon: Benancio Deeds, MD;  Location: [facility];  Service: Gastroenterology;  Laterality: N/A;   IR THORACENTESIS ASP PLEURAL SPACE W/IMG GUIDE  12/16/2020   POLYPECTOMY  12/09/2022   Procedure: POLYPECTOMY;  Surgeon: Benancio Deeds, MD;  Location: [facility];  Service: Gastroenterology;;   THORACENTESIS  04/05/2023   Procedure: THORACENTESIS;  Surgeon: Luciano Cutter, MD;  Location: [facility];  Service: Cardiopulmonary;;   TUBAL LIGATION     US ECHOCARDIOGRAPHY     VIDEO BRONCHOSCOPY N/A 04/05/2023   Procedure: VIDEO  BRONCHOSCOPY WITHOUT FLUORO;  Surgeon: Luciano Cutter, MD;  Location: [facility];  Service: Cardiopulmonary;  Laterality: N/A;   VIDEO BRONCHOSCOPY WITH ENDOBRONCHIAL ULTRASOUND N/A 04/27/2018   Procedure: VIDEO BRONCHOSCOPY WITH ENDOBRONCHIAL ULTRASOUND;  Surgeon: Loreli Slot, MD;  Location: [facility];  Service: Thoracic;  Laterality: N/A;    Family History  Problem Relation Age of Onset   Diabetes Sister    Colon cancer Maternal Aunt        greater than age 15   Breast cancer Cousin    Diabetes Daughter    Diabetes Daughter    Amblyopia Neg Hx    Blindness Neg Hx    Cataracts Neg Hx    Glaucoma Neg Hx    Macular degeneration Neg Hx    Retinal detachment Neg Hx    Strabismus Neg Hx    Retinitis pigmentosa Neg Hx    Rectal cancer Neg Hx    Stomach cancer Neg Hx    Colon polyps Neg Hx    Esophageal cancer Neg Hx     Social History   Occupational History   Occupation: retired  Tobacco Use   Smoking status: Former    Current packs/day: 0.00    Average packs/day: 0.5 packs/day for 55.0 years (27.5 ttl pk-yrs)    Types: Cigarettes    Start date: 03/19/1963    Quit date: 03/18/2018    Years since quitting: 5.0   Smokeless tobacco: Never   Tobacco comments:    smoked off and n  Vaping Use   Vaping status: Never Used  Substance and Sexual Activity   Alcohol use: No   Drug use: No   Sexual activity: Not Currently     Physical Exam: Blood pressure (!) 146/64, pulse 84, height 5\' 5"  (1.651 m), weight 195 lb (88.5 kg), SpO2 97%.  Gen:      No acute distress Lungs:    breath sounds diminished right lung field no wheezes  CV:         RRR no mrg   Data Reviewed: Imaging: I have personally reviewed the CT Chest which shows worsening right lung atelectasis with concern for endobronchial lesion.     I performed a lung ultrasound which showed   PFTs:     Latest Ref Rng & Units 03/15/2022   10:43 AM 05/11/2018    2:33 PM  PFT Results  FVC-Pre L 1.23     FVC-Predicted Pre % 41  71   FVC-Post L 1.13  1.77   FVC-Predicted Post % 38  73   Pre FEV1/FVC % % 80  83   Post FEV1/FCV % % 80  82   FEV1-Pre L 0.98  1.44   FEV1-Predicted Pre % 44  76   FEV1-Post L 0.91  1.46   DLCO uncorrected ml/min/mmHg 7.37  10.35   DLCO UNC% % 37  40   DLCO corrected ml/min/mmHg 7.37  12.65   DLCO COR %Predicted % 37  49   DLVA Predicted % 64  79   TLC L 3.62  4.30   TLC % Predicted % 69  82   RV % Predicted % 88  104    I have personally reviewed the patient's  PFTs and show severe restriction to ventilation with reduced diffusion capacity.   Labs:  Immunization status: Immunization History  Administered Date(s) Administered   Fluad Quad(high Dose 65+) 05/31/2019, 09/12/2020   Influenza-Unspecified 04/16/2014   PFIZER(Purple Top)SARS-COV-2 Vaccination 11/22/2019, 12/17/2019   Pneumococcal Conjugate-13 06/10/2014   Pneumococcal Polysaccharide-23 04/19/2013    External Records Personally Reviewed: oncology, primary care.   Assessment:  NSCLC Stage 3B s/p chemotherapy and radiation with concern for recurrence.  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 Now worsened in the setting of right lung collapse.  Emphysema, mild History of tobacco use, quit 2019 History of recurrent right pleural effusion Chronic cough, likely reflux  Plan/Recommendations: Fine to continue albuterol prn for now.  Bronchoscopy and endobronchial biopsies negative for recurrent malignancy. I'm not sure what is causing the narrowing of her airway. It could be post radiation changes. She may also benefit from repeat bronchoscopy at some point.  Right pleural effusion s/p thoracentesis - negative for malignancy or infection.  I suspect she has a post obstructive pneumonia on the right side which explains her persistent right sided atelectasis. Unfortunately gram stain and culture from bronchoscopy was not sent. Will  treat empirically with augmentin for one week.  I will see her back in 2 months   Blood sugar checked today 168.   Return to Care: Return in about 2 months (around 06/12/2023).   Durel Salts, MD Pulmonary and Critical Care Medicine [facility] [phone]

## 2023-04-26 ENCOUNTER — Telehealth: Payer: Self-pay | Admitting: Internal Medicine

## 2023-04-26 ENCOUNTER — Inpatient Hospital Stay: Payer: 59

## 2023-04-26 ENCOUNTER — Ambulatory Visit (HOSPITAL_COMMUNITY): Payer: 59

## 2023-04-26 NOTE — Telephone Encounter (Signed)
Patient is aware of rescheduled appointment times/dates 

## 2023-05-02 ENCOUNTER — Inpatient Hospital Stay: Payer: 59 | Admitting: Internal Medicine

## 2023-05-03 ENCOUNTER — Inpatient Hospital Stay: Payer: 59 | Attending: Physician Assistant

## 2023-05-03 ENCOUNTER — Ambulatory Visit (HOSPITAL_COMMUNITY)
Admission: RE | Admit: 2023-05-03 | Discharge: 2023-05-03 | Disposition: A | Discharge status: Home / Self Care | Payer: 59 | Source: Ambulatory Visit | Attending: Physician Assistant | Admitting: Physician Assistant

## 2023-05-03 DIAGNOSIS — Z85118 Personal history of other malignant neoplasm of bronchus and lung: Secondary | ICD-10-CM | POA: Diagnosis not present

## 2023-05-03 DIAGNOSIS — J90 Pleural effusion, not elsewhere classified: Secondary | ICD-10-CM | POA: Insufficient documentation

## 2023-05-03 DIAGNOSIS — Z923 Personal history of irradiation: Secondary | ICD-10-CM | POA: Diagnosis not present

## 2023-05-03 DIAGNOSIS — D649 Anemia, unspecified: Secondary | ICD-10-CM

## 2023-05-03 DIAGNOSIS — Z9221 Personal history of antineoplastic chemotherapy: Secondary | ICD-10-CM | POA: Insufficient documentation

## 2023-05-03 DIAGNOSIS — J9811 Atelectasis: Secondary | ICD-10-CM | POA: Diagnosis not present

## 2023-05-03 DIAGNOSIS — D509 Iron deficiency anemia, unspecified: Secondary | ICD-10-CM | POA: Diagnosis not present

## 2023-05-03 DIAGNOSIS — C342 Malignant neoplasm of middle lobe, bronchus or lung: Secondary | ICD-10-CM | POA: Insufficient documentation

## 2023-05-03 DIAGNOSIS — I700 Atherosclerosis of aorta: Secondary | ICD-10-CM | POA: Diagnosis not present

## 2023-05-03 DIAGNOSIS — C3490 Malignant neoplasm of unspecified part of unspecified bronchus or lung: Secondary | ICD-10-CM | POA: Diagnosis not present

## 2023-05-03 LAB — CBC WITH DIFFERENTIAL (CANCER CENTER ONLY)
Abs Immature Granulocytes: 0.02 10*3/uL (ref 0.00–0.07)
Basophils Absolute: 0 10*3/uL (ref 0.0–0.1)
Basophils Relative: 0 %
Eosinophils Absolute: 0.1 10*3/uL (ref 0.0–0.5)
Eosinophils Relative: 3 %
HCT: 34.5 % — ABNORMAL LOW (ref 36.0–46.0)
Hemoglobin: 10.4 g/dL — ABNORMAL LOW (ref 12.0–15.0)
Immature Granulocytes: 0 %
Lymphocytes Relative: 10 %
Lymphs Abs: 0.5 10*3/uL — ABNORMAL LOW (ref 0.7–4.0)
MCH: 26.7 pg (ref 26.0–34.0)
MCHC: 30.1 g/dL (ref 30.0–36.0)
MCV: 88.5 fL (ref 80.0–100.0)
Monocytes Absolute: 0.3 10*3/uL (ref 0.1–1.0)
Monocytes Relative: 6 %
Neutro Abs: 3.7 10*3/uL (ref 1.7–7.7)
Neutrophils Relative %: 81 %
Platelet Count: 214 10*3/uL (ref 150–400)
RBC: 3.9 MIL/uL (ref 3.87–5.11)
RDW: 15.8 % — ABNORMAL HIGH (ref 11.5–15.5)
WBC Count: 4.6 10*3/uL (ref 4.0–10.5)
nRBC: 0 % (ref 0.0–0.2)

## 2023-05-03 LAB — CMP (CANCER CENTER ONLY)
ALT: 18 U/L (ref 0–44)
AST: 23 U/L (ref 15–41)
Albumin: 3.8 g/dL (ref 3.5–5.0)
Alkaline Phosphatase: 101 U/L (ref 38–126)
Anion gap: 6 (ref 5–15)
BUN: 18 mg/dL (ref 8–23)
CO2: 24 mmol/L (ref 22–32)
Calcium: 9.3 mg/dL (ref 8.9–10.3)
Chloride: 110 mmol/L (ref 98–111)
Creatinine: 1.25 mg/dL — ABNORMAL HIGH (ref 0.44–1.00)
GFR, Estimated: 45 mL/min — ABNORMAL LOW (ref >60)
Glucose, Bld: 102 mg/dL — ABNORMAL HIGH (ref 70–99)
Potassium: 4.6 mmol/L (ref 3.5–5.1)
Sodium: 140 mmol/L (ref 135–145)
Total Bilirubin: 0.4 mg/dL (ref 0.3–1.2)
Total Protein: 8 g/dL (ref 6.5–8.1)

## 2023-05-03 LAB — IRON AND IRON BINDING CAPACITY (CC-WL,HP ONLY)
Iron: 61 ug/dL (ref 28–170)
Saturation Ratios: 19 % (ref 10.4–31.8)
TIBC: 329 ug/dL (ref 250–450)
UIBC: 268 ug/dL (ref 148–442)

## 2023-05-03 LAB — FERRITIN: Ferritin: 36 ng/mL (ref 11–307)

## 2023-05-08 LAB — FUNGUS CULTURE WITH STAIN

## 2023-05-08 LAB — FUNGAL ORGANISM REFLEX

## 2023-05-08 LAB — FUNGUS CULTURE RESULT

## 2023-05-17 ENCOUNTER — Inpatient Hospital Stay: Payer: 59

## 2023-05-17 ENCOUNTER — Inpatient Hospital Stay: Payer: 59 | Attending: Physician Assistant | Admitting: Internal Medicine

## 2023-05-17 VITALS — BP 177/65 | HR 91 | Temp 98.6°F | Resp 16 | Ht 65.0 in | Wt 195.6 lb

## 2023-05-17 DIAGNOSIS — Z9221 Personal history of antineoplastic chemotherapy: Secondary | ICD-10-CM | POA: Diagnosis not present

## 2023-05-17 DIAGNOSIS — C3490 Malignant neoplasm of unspecified part of unspecified bronchus or lung: Secondary | ICD-10-CM | POA: Diagnosis not present

## 2023-05-17 DIAGNOSIS — Z923 Personal history of irradiation: Secondary | ICD-10-CM | POA: Diagnosis not present

## 2023-05-17 DIAGNOSIS — Z85118 Personal history of other malignant neoplasm of bronchus and lung: Secondary | ICD-10-CM | POA: Diagnosis not present

## 2023-05-17 NOTE — Progress Notes (Signed)
[facility] Telephone:([phone]   Fax:(336) [phone]  OFFICE PROGRESS NOTE  [name], MD [address] [place] [address]  DIAGNOSIS: Stage IIIB (T3, N3, M0) non-small cell lung cancer, squamous cell carcinoma presented with large right middle lobe lung breast in addition to mediastinal and bilateral hilar lymphadenopathy and suspicious right supraclavicular lymph node diagnosed in August 2019.    PRIOR THERAPY:  1) A course of concurrent chemoradiation with weekly carboplatin for AUC of 2 and paclitaxel 45 MG/M2.  First dose of chemotherapy given on 05/29/2018.  Status post 5 cycles. 2) Consolidation treatment with immunotherapy with Imfinzi 10 mg/KG every 2 weeks.  First dose August 10, 2018.  Status post 26 cycles.   CURRENT THERAPY:  Observation.  INTERVAL HISTORY: Jenna [name] 77 y.o. female returns to the clinic today for follow-up visit.  Discussed the use of AI scribe software for clinical note transcription with the patient, who gave verbal consent to proceed.  History of Present Illness   The patient, a 77 year old with a history of stage 3B non-small cell lung cancer (squamous cell carcinoma), diagnosed in August 2019, underwent chemo-radiation with carboplatin and paclitaxel, followed by consolidation immunotherapy with Imfinzi for one year, completed in December 2020. Since then, the patient has been under observation with six-monthly follow-ups.  Recently, the patient has noticed a change in their breathing pattern, describing it as "different." Accompanying this change is a sensation of lightheadedness and feeling "drunk" all the time, which the patient attributes to their diabetes. The patient also reports a sensation of fluid accumulation in their lungs.  The patient denies any new symptoms apart from the altered breathing and chest pain on the right side. She reports a cough producing mucus, which was previously  colored but has turned white after a course of antibiotics prescribed by their pulmonologist. There is no history of hemoptysis.  The patient also reports bilateral lower extremity swelling, particularly in the feet. She denies taking any diuretics for this issue. The patient has been under the care of a pulmonologist, Dr. Celine Mans, who has previously performed a bronchoscopy and drained fluid from the patient's lungs. The patient is due to see Dr. Celine Mans again later this month.       MEDICAL HISTORY: Past Medical History:  Diagnosis Date   Blood transfusion without reported diagnosis    Cataract    DM (diabetes mellitus) (HCC)    Dyspnea    GERD (gastroesophageal reflux disease)    Hyperlipidemia    Hypertension    Iron deficiency anemia    NSCL ca dx'd 03/2018   Pneumonia    Thyroid disease     ALLERGIES:  is allergic to paclitaxel, aspirin, esomeprazole magnesium, and ace inhibitors.  MEDICATIONS:  Current Outpatient Medications  Medication Sig Dispense Refill   albuterol (VENTOLIN HFA) 108 (90 Base) MCG/ACT inhaler Inhale 1-2 puffs into the lungs every 6 (six) hours as needed for wheezing or shortness of breath. 6.7 g 0   amoxicillin-clavulanate (AUGMENTIN) 875-125 MG tablet Take 1 tablet by mouth 2 (two) times daily. 14 tablet 0   B-D UF III MINI PEN NEEDLES 31G X 5 MM MISC Inject into the skin every morning.     blood glucose meter kit and supplies KIT Use up to four times daily as directed 1 each 0   Continuous Glucose Sensor (DEXCOM G7 SENSOR) MISC 1 Device by Does not apply route continuous. 9 each 0   DEXILANT  30 MG capsule Take 1 capsule (30 mg total) by mouth daily. 90 capsule 3   diphenhydrAMINE (BENADRYL) 2 % cream Apply 1 application topically as needed for itching.     FARXIGA 10 MG TABS tablet Take 1 tablet (10 mg total) by mouth daily before breakfast. Follow-up appt due in Nov must see provider for future refills 90 tablet 0   Ferrous Sulfate (IRON) 325 (65 Fe) MG  TABS Take 1 tablet (325 mg total) by mouth 2 (two) times daily with a meal. (Patient taking differently: Take 1 tablet by mouth See admin instructions. Take every other week) 180 tablet 0   fluticasone furoate-vilanterol (BREO ELLIPTA) 200-25 MCG/ACT AEPB Inhale 1 puff into the lungs daily. (Patient not taking: Reported on 04/12/2023) 30 each 3   insulin glargine (LANTUS SOLOSTAR) 100 UNIT/ML Solostar Pen Inject 34 Units into the skin daily. And 31G, 5mm pen needles 1/day 30 mL 11   labetalol (NORMODYNE) 100 MG tablet Take 0.5 tablets (50 mg total) by mouth 2 (two) times daily. 90 tablet 1   levothyroxine (SYNTHROID) 75 MCG tablet Take 1 tablet by mouth once daily 90 tablet 3   losartan (COZAAR) 50 MG tablet Take 1 tablet by mouth once daily 90 tablet 0   Multiple Vitamin (MULTIVITAMIN) capsule Take 1 capsule by mouth daily.     pravastatin (PRAVACHOL) 20 MG tablet Take 1 tablet (20 mg total) by mouth daily. 90 tablet 3   No current facility-administered medications for this visit.    SURGICAL HISTORY:  Past Surgical History:  Procedure Laterality Date   BIOPSY  12/09/2022   Procedure: BIOPSY;  Surgeon: Benancio Deeds, MD;  Location: [facility];  Service: Gastroenterology;;   BRONCHIAL BIOPSY  04/05/2023   Procedure: BRONCHIAL BIOPSIES;  Surgeon: Luciano Cutter, MD;  Location: [facility];  Service: Cardiopulmonary;;   BRONCHIAL BRUSHINGS  04/05/2023   Procedure: BRONCHIAL BRUSHINGS;  Surgeon: Luciano Cutter, MD;  Location: [facility];  Service: Cardiopulmonary;;   BRONCHIAL WASHINGS  04/05/2023   Procedure: BRONCHIAL WASHINGS;  Surgeon: Luciano Cutter, MD;  Location: [facility];  Service: Cardiopulmonary;;   CATARACT EXTRACTION Right    CHOLECYSTECTOMY     COLONOSCOPY  10/24/2009   normal rectum/1X1cm abnormal lesion in the ascending colon (bx benign). TI normal for 10cm.  Prep difficult/inadequate. f/u TCS 09/2012 recommended   COLONOSCOPY  10/13/2004   Normal  rectum/Diminutive polyps, splenic flexure, cold biopsied/removed.  Remainder of colonic mucosa appeared normal.   COLONOSCOPY N/A 12/14/2012   ZOX:WRUEAVW polyp-tubular adenoma   COLONOSCOPY WITH PROPOFOL N/A 12/09/2022   Procedure: COLONOSCOPY WITH PROPOFOL;  Surgeon: Benancio Deeds, MD;  Location: [facility];  Service: Gastroenterology;  Laterality: N/A;   ESOPHAGOGASTRODUODENOSCOPY  10/13/2004    Normal esophagus/ Nodular volcano like lesion in the antrum, either representing a  pancreatic rest or leiomyoma, biopsied.  Remainder of the gastric mucosa appeared normal, normal D1-D2   ESOPHAGOGASTRODUODENOSCOPY  10/24/2009   Benign biopsies. normal esophagus/small hiatal hernia/nodular lesion antrum/distal greater curvature. duodenal AVM s/p ablation   GIVENS CAPSULE STUDY  07/27/2010    multiple arteriovenous malformations which could definitely be the contributor to her drifting hemoglobin and hematocrit   HEMOSTASIS CONTROL  04/05/2023   Procedure: HEMOSTASIS CONTROL;  Surgeon: Luciano Cutter, MD;  Location: [facility];  Service: Cardiopulmonary;;   HOT HEMOSTASIS N/A 12/09/2022   Procedure: HOT HEMOSTASIS (ARGON PLASMA COAGULATION/BICAP);  Surgeon: Benancio Deeds, MD;  Location: [facility];  Service: Gastroenterology;  Laterality: N/A;  IR THORACENTESIS ASP PLEURAL SPACE W/IMG GUIDE  12/16/2020   POLYPECTOMY  12/09/2022   Procedure: POLYPECTOMY;  Surgeon: Benancio Deeds, MD;  Location: [facility];  Service: Gastroenterology;;   THORACENTESIS  04/05/2023   Procedure: THORACENTESIS;  Surgeon: Luciano Cutter, MD;  Location: [facility];  Service: Cardiopulmonary;;   TUBAL LIGATION     US ECHOCARDIOGRAPHY     VIDEO BRONCHOSCOPY N/A 04/05/2023   Procedure: VIDEO BRONCHOSCOPY WITHOUT FLUORO;  Surgeon: Luciano Cutter, MD;  Location: [facility];  Service: Cardiopulmonary;  Laterality: N/A;   VIDEO BRONCHOSCOPY WITH ENDOBRONCHIAL ULTRASOUND N/A 04/27/2018    Procedure: VIDEO BRONCHOSCOPY WITH ENDOBRONCHIAL ULTRASOUND;  Surgeon: Loreli Slot, MD;  Location: [facility];  Service: Thoracic;  Laterality: N/A;    REVIEW OF SYSTEMS:  Constitutional: positive for fatigue Eyes: negative Ears, nose, mouth, throat, and face: negative Respiratory: positive for dyspnea on exertion Cardiovascular: negative Gastrointestinal: negative Genitourinary:negative Integument/breast: negative Hematologic/lymphatic: negative Musculoskeletal:negative Neurological: negative Behavioral/Psych: negative Endocrine: negative Allergic/Immunologic: negative   PHYSICAL EXAMINATION: General appearance: alert, cooperative, fatigued, and no distress Head: Normocephalic, without obvious abnormality, atraumatic Neck: no adenopathy, no JVD, supple, symmetrical, trachea midline, and thyroid not enlarged, symmetric, no tenderness/mass/nodules Lymph nodes: Cervical, supraclavicular, and axillary nodes normal. Resp: diminished breath sounds RLL and dullness to percussion RLL Back: symmetric, no curvature. ROM normal. No CVA tenderness. Cardio: regular rate and rhythm, S1, S2 normal, no murmur, click, rub or gallop GI: soft, non-tender; bowel sounds normal; no masses,  no organomegaly Extremities: edema 1+ edema bilaterally Neurologic: Alert and oriented X 3, normal strength and tone. Normal symmetric reflexes. Normal coordination and gait  ECOG PERFORMANCE STATUS: 1 - Symptomatic but completely ambulatory  Blood pressure (!) 177/65, pulse 91, temperature 98.6 F (37 C), temperature source Oral, resp. rate 16, height 5\' 5"  (1.651 m), weight 195 lb 9.6 oz (88.7 kg), SpO2 98%.  LABORATORY DATA: Lab Results  Component Value Date   WBC 4.6 05/03/2023   HGB 10.4 (L) 05/03/2023   HCT 34.5 (L) 05/03/2023   MCV 88.5 05/03/2023   PLT 214 05/03/2023      Chemistry      Component Value Date/Time   NA 140 05/03/2023 1019   K 4.6 05/03/2023 1019   CL 110 05/03/2023 1019    CO2 24 05/03/2023 1019   BUN 18 05/03/2023 1019   BUN 24 (A) 02/12/2020 0000   CREATININE 1.25 (H) 05/03/2023 1019   CREATININE 0.72 12/10/2011 0919      Component Value Date/Time   CALCIUM 9.3 05/03/2023 1019   ALKPHOS 101 05/03/2023 1019   ALKPHOS 88 12/10/2011 0919   AST 23 05/03/2023 1019   ALT 18 05/03/2023 1019   BILITOT 0.4 05/03/2023 1019       RADIOGRAPHIC STUDIES: CT CHEST WO CONTRAST  Result Date: 05/16/2023 CLINICAL DATA:  Follow-up non-small cell lung cancer EXAM: CT CHEST WITHOUT CONTRAST TECHNIQUE: Multidetector CT imaging of the chest was performed following the standard protocol without IV contrast. RADIATION DOSE REDUCTION: This exam was performed according to the departmental dose-optimization program which includes automated exposure control, adjustment of the mA and/or kV according to patient size and/or use of iterative reconstruction technique. COMPARISON:  CT chest dated 01/19/2023 FINDINGS: Cardiovascular: The heart is top-normal in size. Trace anterior pericardial fluid. No evidence of thoracic aortic aneurysm. Atherosclerotic calcifications of the aortic arch. Moderate atherosclerosis of the LAD and right coronary artery. Mediastinum/Nodes: No suspicious mediastinal lymphadenopathy. Visualized thyroid is unremarkable. Lungs/Pleura: Large right pleural effusion, mildly  progressive. Suspected perihilar radiation changes in the central right middle lobe (series 4/image 9), poorly evaluated. Compressive atelectasis in the right lower lobe. Vague scarring/interstitial opacity in the left lung. Stable 4 mm subpleural left lower lobe ground-glass nodule (series 4/image 88). No pneumothorax. Upper Abdomen: Visualized upper abdomen notable for prior cholecystectomy and vascular calcifications. Musculoskeletal: Mild degenerative changes of the visualized thoracolumbar spine. IMPRESSION: Large right pleural effusion, mildly progressive. Suspected perihilar radiation changes in the  central right middle lobe, poorly evaluated. Consider attention on follow-up. Aortic Atherosclerosis (ICD10-I70.0). Electronically Signed   By: Charline Bills M.D.   On: 05/16/2023 22:51     ASSESSMENT AND PLAN: This is a very pleasant 77 years old African-American female recently diagnosed with a stage IIIB non-small cell lung cancer, squamous cell carcinoma.  She completed a course of concurrent chemoradiation with weekly carboplatin and paclitaxel status post 5 cycles with partial response.  She tolerated this treatment well except for the pancytopenia and fatigue.  The patient completed a course of treatment with consolidation immunotherapy with Imfinzi status post 26 cycles.  She tolerated her treatment well with no concerning adverse effects.  The patient has been on observation since that time and she is feeling fine with no concerning complaints except for the baseline shortness of breath and fatigue. She had repeat CT scan of the chest performed recently that showed large right pleural effusion mildly progressive but no other evidence of disease progression.    Stage 3B Non-Small Cell Lung Cancer (Squamous Cell Carcinoma) Diagnosed in August 2019, treated with carboplatin and paclitaxel, followed by consolidation immunotherapy with Imfinzi. Completed treatment in December 2020 and currently under observation. No evidence of disease progression. -Continue observation with follow-up in six months.  Right Pleural Effusion Moderate to large effusion causing dyspnea and chest pain. No cough or hemoptysis. Recent antibiotics for colored sputum. -Plan for ultrasound-guided thoracentesis to drain the effusion. -Consider placement of a pleurx catheter for continuous drainage at home if necessary. -Communicate with pulmonologist (Dr. Celine Mans) regarding the plan for drainage and potential catheter placement.  Lower Extremity Edema Bilateral lower extremity swelling, no current diuretic  therapy. -Recommend evaluation by pulmonologist for potential diuretic therapy.   The patient was advised to call immediately if she has any other concerning symptoms in the interval. The patient voices understanding of current disease status and treatment options and is in agreement with the current care plan. All questions were answered. The patient knows to call the clinic with any problems, questions or concerns. We can certainly see the patient much sooner if necessary. The total time spent in the appointment was 30 minutes.  Disclaimer: This note was dictated with voice recognition software. Similar sounding words can inadvertently be transcribed and may not be corrected upon review.

## 2023-05-18 ENCOUNTER — Encounter: Payer: Self-pay | Admitting: "Endocrinology

## 2023-05-18 ENCOUNTER — Ambulatory Visit (INDEPENDENT_AMBULATORY_CARE_PROVIDER_SITE_OTHER): Payer: 59 | Admitting: "Endocrinology

## 2023-05-18 VITALS — BP 163/90 | HR 96 | Ht 65.0 in | Wt 194.2 lb

## 2023-05-18 DIAGNOSIS — Z794 Long term (current) use of insulin: Secondary | ICD-10-CM

## 2023-05-18 DIAGNOSIS — Z7984 Long term (current) use of oral hypoglycemic drugs: Secondary | ICD-10-CM | POA: Diagnosis not present

## 2023-05-18 DIAGNOSIS — E11649 Type 2 diabetes mellitus with hypoglycemia without coma: Secondary | ICD-10-CM | POA: Diagnosis not present

## 2023-05-18 DIAGNOSIS — E7800 Pure hypercholesterolemia, unspecified: Secondary | ICD-10-CM | POA: Diagnosis not present

## 2023-05-18 LAB — POCT GLYCOSYLATED HEMOGLOBIN (HGB A1C): Hemoglobin A1C: 6.3 % — AB (ref 4.0–5.6)

## 2023-05-18 NOTE — Patient Instructions (Signed)

## 2023-05-18 NOTE — Progress Notes (Signed)
Outpatient Endocrinology Note Altamese Galloway, MD  05/18/23   Jenna [name] [birth year] [id number]  Referring Provider: [name], * Primary Care Provider: [name], MD Reason for consultation: Subjective   Assessment & Plan  Diagnoses and all orders for this visit:  Uncontrolled type 2 diabetes mellitus with hypoglycemia without coma (HCC) -     POCT glycosylated hemoglobin (Hb A1C)  Long-term insulin use (HCC)  Long term (current) use of oral hypoglycemic drugs  Pure hypercholesterolemia    Diabetes complicated by neuropathy  Hba1c goal less than 7.0, current Hba1c is 6.3. Will recommend the following: Lantus 30-32 units qam Farxiga 10 mg qd  No known contraindications to any of above medications  Hyperlipidemia -Last LDL at goal: 74 -on pravastatin 20 mg QD -Follow low fat diet and exercise   -Blood pressure goal <140/90 - Microalbumin/creatinine off goal at 37.5 - on ACE/ARB Losartan 50 mg qd  -diet changes including salt restriction -limit eating outside -counseled BP targets per standards of diabetes care -Uncontrolled blood pressure can lead to retinopathy, nephropathy and cardiovascular and atherosclerotic heart disease  Reviewed and counseled on: -A1C target -Blood sugar targets -Complications of uncontrolled diabetes  -Checking blood sugar before meals and bedtime and bring log next visit -All medications with mechanism of action and side effects -Hypoglycemia management: rule of 15's, Glucagon Emergency Kit and medical alert ID -low-carb low-fat plate-method diet -At least 20 minutes of physical activity per day -Annual dilated retinal eye exam and foot exam -compliance and follow up needs -follow up as scheduled or earlier if problem gets worse  Call if blood sugar is less than 70 or consistently above 250    Take a 15 gm snack of carbohydrate at bedtime before you go to sleep if your blood sugar is  less than 100.    If you are going to fast after midnight for a test or procedure, ask your physician for instructions on how to reduce/decrease your insulin dose.    Call if blood sugar is less than 70 or consistently above 250  -Treating a low sugar by rule of 15  (15 gms of sugar every 15 min until sugar is more than 70) If you feel your sugar is low, test your sugar to be sure If your sugar is low (less than 70), then take 15 grams of a fast acting Carbohydrate (3-4 glucose tablets or glucose gel or 4 ounces of juice or regular soda) Recheck your sugar 15 min after treating low to make sure it is more than 70 If sugar is still less than 70, treat again with 15 grams of carbohydrate          Don't drive the hour of hypoglycemia  If unconscious/unable to eat or drink by mouth, use glucagon injection or nasal spray baqsimi and call 911. Can repeat again in 15 min if still unconscious.  Return in about 15 days (around 06/02/2023).   I have reviewed current medications, nurse's notes, allergies, vital signs, past medical and surgical history, family medical history, and social history for this encounter. Counseled patient on symptoms, examination findings, lab findings, imaging results, treatment decisions and monitoring and prognosis. The patient understood the recommendations and agrees with the treatment plan. All questions regarding treatment plan were fully answered.  Altamese Franklin, MD  05/18/23    History of Present Illness Jenna [name] is a 77 y.o. year old female who presents for follow up for Type 2 diabetes  mellitus.  Areal Kohtz was first diagnosed in 2000.   Diabetes education +  Home diabetes regimen: Lantus 34 units qam Farxiga 10 mg qd  COMPLICATIONS -  MI/Stroke, +PAD -  retinopathy +  neuropathy +  nephropathy, GFR 45  BLOOD SUGAR DATA Checks few times a day Range 79-234  Physical Exam  BP (!) 163/90   Pulse 96   Ht 5\' 5"  (1.651 m)    Wt 194 lb 3.2 oz (88.1 kg)   SpO2 98%   BMI 32.32 kg/m    Constitutional: well developed, well nourished Head: normocephalic, atraumatic Eyes: sclera anicteric, no redness Neck: supple Lungs: normal respiratory effort Neurology: alert and oriented Skin: dry, no appreciable rashes Musculoskeletal: no appreciable defects Psychiatric: normal mood and affect    Current Medications Patient's Medications  New Prescriptions   No medications on file  Previous Medications   ALBUTEROL (VENTOLIN HFA) 108 (90 BASE) MCG/ACT INHALER    Inhale 1-2 puffs into the lungs every 6 (six) hours as needed for wheezing or shortness of breath.   AMOXICILLIN-CLAVULANATE (AUGMENTIN) 875-125 MG TABLET    Take 1 tablet by mouth 2 (two) times daily.   B-D UF III MINI PEN NEEDLES 31G X 5 MM MISC    Inject into the skin every morning.   BLOOD GLUCOSE METER KIT AND SUPPLIES KIT    Use up to four times daily as directed   CONTINUOUS GLUCOSE SENSOR (DEXCOM G7 SENSOR) MISC    1 Device by Does not apply route continuous.   DEXILANT 30 MG CAPSULE    Take 1 capsule (30 mg total) by mouth daily.   DIPHENHYDRAMINE (BENADRYL) 2 % CREAM    Apply 1 application topically as needed for itching.   FARXIGA 10 MG TABS TABLET    Take 1 tablet (10 mg total) by mouth daily before breakfast. Follow-up appt due in Nov must see provider for future refills   FERROUS SULFATE (IRON) 325 (65 FE) MG TABS    Take 1 tablet (325 mg total) by mouth 2 (two) times daily with a meal.   FLUTICASONE FUROATE-VILANTEROL (BREO ELLIPTA) 200-25 MCG/ACT AEPB    Inhale 1 puff into the lungs daily.   INSULIN GLARGINE (LANTUS SOLOSTAR) 100 UNIT/ML SOLOSTAR PEN    Inject 34 Units into the skin daily. And 31G, 5mm pen needles 1/day   LABETALOL (NORMODYNE) 100 MG TABLET    Take 0.5 tablets (50 mg total) by mouth 2 (two) times daily.   LEVOTHYROXINE (SYNTHROID) 75 MCG TABLET    Take 1 tablet by mouth once daily   LOSARTAN (COZAAR) 50 MG TABLET    Take 1 tablet  by mouth once daily   MULTIPLE VITAMIN (MULTIVITAMIN) CAPSULE    Take 1 capsule by mouth daily.   PRAVASTATIN (PRAVACHOL) 20 MG TABLET    Take 1 tablet (20 mg total) by mouth daily.  Modified Medications   No medications on file  Discontinued Medications   No medications on file    Allergies Allergies  Allergen Reactions   Paclitaxel Other (See Comments)    Unresponsiveness shortly after Taxol inf started 06/12/18.   Aspirin Other (See Comments)    Stomach bleeding    Esomeprazole Magnesium     UNSPECIFIED REACTION    Ace Inhibitors Other (See Comments)    Dizziness, drunk like    Past Medical History Past Medical History:  Diagnosis Date   Blood transfusion without reported diagnosis    Cataract    DM (  diabetes mellitus) (HCC)    Dyspnea    GERD (gastroesophageal reflux disease)    Hyperlipidemia    Hypertension    Iron deficiency anemia    NSCL ca dx'd 03/2018   Pneumonia    Thyroid disease     Past Surgical History Past Surgical History:  Procedure Laterality Date   BIOPSY  12/09/2022   Procedure: BIOPSY;  Surgeon: Benancio Deeds, MD;  Location: [facility];  Service: Gastroenterology;;   BRONCHIAL BIOPSY  04/05/2023   Procedure: BRONCHIAL BIOPSIES;  Surgeon: Luciano Cutter, MD;  Location: [facility];  Service: Cardiopulmonary;;   BRONCHIAL BRUSHINGS  04/05/2023   Procedure: BRONCHIAL BRUSHINGS;  Surgeon: Luciano Cutter, MD;  Location: [facility];  Service: Cardiopulmonary;;   BRONCHIAL WASHINGS  04/05/2023   Procedure: BRONCHIAL WASHINGS;  Surgeon: Luciano Cutter, MD;  Location: [facility];  Service: Cardiopulmonary;;   CATARACT EXTRACTION Right    CHOLECYSTECTOMY     COLONOSCOPY  10/24/2009   normal rectum/1X1cm abnormal lesion in the ascending colon (bx benign). TI normal for 10cm.  Prep difficult/inadequate. f/u TCS 09/2012 recommended   COLONOSCOPY  10/13/2004   Normal rectum/Diminutive polyps, splenic flexure, cold biopsied/removed.   Remainder of colonic mucosa appeared normal.   COLONOSCOPY N/A 12/14/2012   ZOX:WRUEAVW polyp-tubular adenoma   COLONOSCOPY WITH PROPOFOL N/A 12/09/2022   Procedure: COLONOSCOPY WITH PROPOFOL;  Surgeon: Benancio Deeds, MD;  Location: [facility];  Service: Gastroenterology;  Laterality: N/A;   ESOPHAGOGASTRODUODENOSCOPY  10/13/2004    Normal esophagus/ Nodular volcano like lesion in the antrum, either representing a  pancreatic rest or leiomyoma, biopsied.  Remainder of the gastric mucosa appeared normal, normal D1-D2   ESOPHAGOGASTRODUODENOSCOPY  10/24/2009   Benign biopsies. normal esophagus/small hiatal hernia/nodular lesion antrum/distal greater curvature. duodenal AVM s/p ablation   GIVENS CAPSULE STUDY  07/27/2010    multiple arteriovenous malformations which could definitely be the contributor to her drifting hemoglobin and hematocrit   HEMOSTASIS CONTROL  04/05/2023   Procedure: HEMOSTASIS CONTROL;  Surgeon: Luciano Cutter, MD;  Location: [facility];  Service: Cardiopulmonary;;   HOT HEMOSTASIS N/A 12/09/2022   Procedure: HOT HEMOSTASIS (ARGON PLASMA COAGULATION/BICAP);  Surgeon: Benancio Deeds, MD;  Location: [facility];  Service: Gastroenterology;  Laterality: N/A;   IR THORACENTESIS ASP PLEURAL SPACE W/IMG GUIDE  12/16/2020   POLYPECTOMY  12/09/2022   Procedure: POLYPECTOMY;  Surgeon: Benancio Deeds, MD;  Location: [facility];  Service: Gastroenterology;;   THORACENTESIS  04/05/2023   Procedure: THORACENTESIS;  Surgeon: Luciano Cutter, MD;  Location: [facility];  Service: Cardiopulmonary;;   TUBAL LIGATION     US ECHOCARDIOGRAPHY     VIDEO BRONCHOSCOPY N/A 04/05/2023   Procedure: VIDEO BRONCHOSCOPY WITHOUT FLUORO;  Surgeon: Luciano Cutter, MD;  Location: [facility];  Service: Cardiopulmonary;  Laterality: N/A;   VIDEO BRONCHOSCOPY WITH ENDOBRONCHIAL ULTRASOUND N/A 04/27/2018   Procedure: VIDEO BRONCHOSCOPY WITH ENDOBRONCHIAL ULTRASOUND;  Surgeon:  Loreli Slot, MD;  Location: [facility] OR;  Service: Thoracic;  Laterality: N/A;    Family History family history includes Breast cancer in her cousin; Colon cancer in her maternal aunt; Diabetes in her daughter, daughter, and sister.  Social History Social History   Socioeconomic History   Marital status: Single    Spouse name: Not on file   Number of children: 3   Years of education: Not on file   Highest education level: Not on file  Occupational History   Occupation: retired  Tobacco Use   Smoking  status: Former    Current packs/day: 0.00    Average packs/day: 0.5 packs/day for 55.0 years (27.5 ttl pk-yrs)    Types: Cigarettes    Start date: 03/19/1963    Quit date: 03/18/2018    Years since quitting: 5.1   Smokeless tobacco: Never   Tobacco comments:    smoked off and n  Vaping Use   Vaping status: Never Used  Substance and Sexual Activity   Alcohol use: No   Drug use: No   Sexual activity: Not Currently  Other Topics Concern   Not on file  Social History Narrative   Patient fully vaccinated   Social Determinants of Health   Financial Resource Strain: Low Risk  (09/30/2020)   Overall Financial Resource Strain (CARDIA)    Difficulty of Paying Living Expenses: Not hard at all  Food Insecurity: No Food Insecurity (09/30/2020)   Hunger Vital Sign    Worried About Running Out of Food in the Last Year: Never true    Ran Out of Food in the Last Year: Never true  Transportation Needs: No Transportation Needs (09/30/2020)   PRAPARE - Administrator, Civil Service (Medical): No    Lack of Transportation (Non-Medical): No  Physical Activity: Inactive (09/30/2020)   Exercise Vital Sign    Days of Exercise per Week: 0 days    Minutes of Exercise per Session: 0 min  Stress: No Stress Concern Present (09/30/2020)   [employer] of Occupational Health - Occupational Stress Questionnaire    Feeling of Stress : Not at all  Social Connections: Socially Isolated  (09/30/2020)   Social Connection and Isolation Panel [NHANES]    Frequency of Communication with Friends and Family: More than three times a week    Frequency of Social Gatherings with Friends and Family: More than three times a week    Attends Religious Services: Never    Database administrator or Organizations: No    Attends Banker Meetings: Never    Marital Status: Never married  Intimate Partner Violence: Not At Risk (09/30/2020)   Humiliation, Afraid, Rape, and Kick questionnaire    Fear of Current or Ex-Partner: No    Emotionally Abused: No    Physically Abused: No    Sexually Abused: No    Lab Results  Component Value Date   HGBA1C 6.3 (A) 05/18/2023   HGBA1C 6.9 (H) 12/22/2022   HGBA1C 6.9 (A) 06/16/2022   Lab Results  Component Value Date   CHOL 136 12/22/2022   Lab Results  Component Value Date   HDL 41.50 12/22/2022   Lab Results  Component Value Date   LDLCALC 74 12/22/2022   Lab Results  Component Value Date   TRIG 101.0 12/22/2022   Lab Results  Component Value Date   CHOLHDL 3 12/22/2022   Lab Results  Component Value Date   CREATININE 1.25 (H) 05/03/2023   Lab Results  Component Value Date   GFR 33.36 (L) 12/22/2022   Lab Results  Component Value Date   MICROALBUR 30.5 (H) 12/22/2022      Component Value Date/Time   NA 140 05/03/2023 1019   K 4.6 05/03/2023 1019   CL 110 05/03/2023 1019   CO2 24 05/03/2023 1019   GLUCOSE 102 (H) 05/03/2023 1019   BUN 18 05/03/2023 1019   BUN 24 (A) 02/12/2020 0000   CREATININE 1.25 (H) 05/03/2023 1019   CREATININE 0.72 12/10/2011 0919   CALCIUM 9.3 05/03/2023 1019  PROT 8.0 05/03/2023 1019   ALBUMIN 3.8 05/03/2023 1019   ALBUMIN 4.3 12/10/2011 0919   AST 23 05/03/2023 1019   ALT 18 05/03/2023 1019   ALKPHOS 101 05/03/2023 1019   ALKPHOS 88 12/10/2011 0919   BILITOT 0.4 05/03/2023 1019   GFRNONAA 45 (L) 05/03/2023 1019   GFRAA 43 (L) 02/12/2020 0941      Latest Ref Rng & Units  05/03/2023   10:19 AM 01/24/2023    9:27 AM 01/19/2023   11:10 AM  BMP  Glucose 70 - 99 mg/dL 160  109  323   BUN 8 - 23 mg/dL 18  29  22    Creatinine 0.44 - 1.00 mg/dL 5.57  3.22  0.25   Sodium 135 - 145 mmol/L 140  139  139   Potassium 3.5 - 5.1 mmol/L 4.6  5.1  5.4   Chloride 98 - 111 mmol/L 110  108  108   CO2 22 - 32 mmol/L 24  25  24    Calcium 8.9 - 10.3 mg/dL 9.3  9.8  9.5        Component Value Date/Time   WBC 4.6 05/03/2023 1023   WBC 5.5 12/22/2022 1025   RBC 3.90 05/03/2023 1023   HGB 10.4 (L) 05/03/2023 1023   HGB 8.1 (L) 12/27/2017 1123   HCT 34.5 (L) 05/03/2023 1023   HCT 24.4 (L) 12/27/2017 1123   PLT 214 05/03/2023 1023   PLT 296 12/27/2017 1123   MCV 88.5 05/03/2023 1023   MCV 70 (L) 12/27/2017 1123   MCH 26.7 05/03/2023 1023   MCHC 30.1 05/03/2023 1023   RDW 15.8 (H) 05/03/2023 1023   RDW 17.0 (H) 12/27/2017 1123   LYMPHSABS 0.5 (L) 05/03/2023 1023   LYMPHSABS 0.8 12/27/2017 1123   MONOABS 0.3 05/03/2023 1023   EOSABS 0.1 05/03/2023 1023   EOSABS 0.1 12/27/2017 1123   BASOSABS 0.0 05/03/2023 1023   BASOSABS 0.0 12/27/2017 1123     Parts of this note may have been dictated using voice recognition software. There may be variances in spelling and vocabulary which are unintentional. Not all errors are proofread. Please notify the Thereasa Parkin if any discrepancies are noted or if the meaning of any statement is not clear.

## 2023-05-19 LAB — ACID FAST CULTURE WITH REFLEXED SENSITIVITIES (MYCOBACTERIA): Acid Fast Culture: NEGATIVE

## 2023-05-20 ENCOUNTER — Emergency Department (HOSPITAL_COMMUNITY)
Admission: EM | Admit: 2023-05-20 | Discharge: 2023-05-21 | Disposition: A | Discharge status: Home / Self Care | Payer: 59 | Attending: Emergency Medicine | Admitting: Emergency Medicine

## 2023-05-20 DIAGNOSIS — E1165 Type 2 diabetes mellitus with hyperglycemia: Secondary | ICD-10-CM | POA: Insufficient documentation

## 2023-05-20 DIAGNOSIS — H81399 Other peripheral vertigo, unspecified ear: Secondary | ICD-10-CM | POA: Insufficient documentation

## 2023-05-20 DIAGNOSIS — R110 Nausea: Secondary | ICD-10-CM | POA: Diagnosis present

## 2023-05-20 DIAGNOSIS — R42 Dizziness and giddiness: Secondary | ICD-10-CM | POA: Diagnosis not present

## 2023-05-20 DIAGNOSIS — Z85118 Personal history of other malignant neoplasm of bronchus and lung: Secondary | ICD-10-CM | POA: Diagnosis not present

## 2023-05-20 DIAGNOSIS — D649 Anemia, unspecified: Secondary | ICD-10-CM | POA: Insufficient documentation

## 2023-05-20 DIAGNOSIS — Z79899 Other long term (current) drug therapy: Secondary | ICD-10-CM | POA: Diagnosis not present

## 2023-05-20 DIAGNOSIS — I10 Essential (primary) hypertension: Secondary | ICD-10-CM | POA: Diagnosis not present

## 2023-05-20 DIAGNOSIS — Z794 Long term (current) use of insulin: Secondary | ICD-10-CM | POA: Diagnosis not present

## 2023-05-20 DIAGNOSIS — N289 Disorder of kidney and ureter, unspecified: Secondary | ICD-10-CM | POA: Diagnosis not present

## 2023-05-20 NOTE — ED Triage Notes (Signed)
Patient here from home reporting ongoing issues with hypertension and hyperglycemia.

## 2023-05-21 DIAGNOSIS — I10 Essential (primary) hypertension: Secondary | ICD-10-CM | POA: Diagnosis not present

## 2023-05-21 LAB — BASIC METABOLIC PANEL
Anion gap: 9 (ref 5–15)
BUN: 25 mg/dL — ABNORMAL HIGH (ref 8–23)
CO2: 23 mmol/L (ref 22–32)
Calcium: 9.3 mg/dL (ref 8.9–10.3)
Chloride: 104 mmol/L (ref 98–111)
Creatinine, Ser: 1.4 mg/dL — ABNORMAL HIGH (ref 0.44–1.00)
GFR, Estimated: 39 mL/min — ABNORMAL LOW (ref >60)
Glucose, Bld: 188 mg/dL — ABNORMAL HIGH (ref 70–99)
Potassium: 4.2 mmol/L (ref 3.5–5.1)
Sodium: 136 mmol/L (ref 135–145)

## 2023-05-21 LAB — URINALYSIS, ROUTINE W REFLEX MICROSCOPIC
Bacteria, UA: NONE SEEN
Bilirubin Urine: NEGATIVE
Glucose, UA: >=500 mg/dL — AB
Hgb urine dipstick: NEGATIVE
Ketones, ur: NEGATIVE mg/dL
Leukocytes,Ua: NEGATIVE
Nitrite: NEGATIVE
Protein, ur: 100 mg/dL — AB
Specific Gravity, Urine: 1.018 (ref 1.005–1.030)
pH: 5 (ref 5.0–8.0)

## 2023-05-21 LAB — CBC
HCT: 36.3 % (ref 36.0–46.0)
Hemoglobin: 10.8 g/dL — ABNORMAL LOW (ref 12.0–15.0)
MCH: 27.1 pg (ref 26.0–34.0)
MCHC: 29.8 g/dL — ABNORMAL LOW (ref 30.0–36.0)
MCV: 91.2 fL (ref 80.0–100.0)
Platelets: 206 10*3/uL (ref 150–400)
RBC: 3.98 MIL/uL (ref 3.87–5.11)
RDW: 16.1 % — ABNORMAL HIGH (ref 11.5–15.5)
WBC: 4.7 10*3/uL (ref 4.0–10.5)
nRBC: 0 % (ref 0.0–0.2)

## 2023-05-21 LAB — CBG MONITORING, ED
Glucose-Capillary: 146 mg/dL — ABNORMAL HIGH (ref 70–99)
Glucose-Capillary: 168 mg/dL — ABNORMAL HIGH (ref 70–99)

## 2023-05-21 MED ORDER — MECLIZINE HCL 25 MG PO TABS: 25.0000 mg | ORAL_TABLET | Freq: Three times a day (TID) | ORAL | 0 refills | Status: AC | PRN

## 2023-05-21 MED ORDER — MECLIZINE HCL 25 MG PO TABS
25.0000 mg | ORAL_TABLET | Freq: Once | ORAL | Status: AC
  Administered 2023-05-21: 25 mg via ORAL
  Filled 2023-05-21: qty 1

## 2023-05-21 NOTE — ED Provider Notes (Signed)
[place] EMERGENCY DEPARTMENT AT [facility] Provider Note   CSN: [id number] Arrival date & time: 05/20/23  2251     History  Chief Complaint  Patient presents with   Hyperglycemia   Hypertension    Jenna [name] is a 77 y.o. female.  The history is provided by the patient.  Hyperglycemia Hypertension  She has history of hypertension, diabetes, hyperlipidemia, non-small cell cancer of the lung and comes in because of a swimmy headed feeling she has been having intermittently over the last 2-3 days.  This seems to be worse after she takes her insulin, when she eats, when she stands up, when she turns her head.  There is mild associated nausea.  She has checked her sugars, and they have been running higher than normal-200.  She also was in her endocrinologist office and was told that her blood pressure was high.  She does not check her blood pressure at home.  She denies weakness or numbness.  She denies any visual changes.  She denies ear pain, tinnitus, hearing loss.   Home Medications Prior to Admission medications   Medication Sig Start Date End Date Taking? Authorizing Provider  albuterol (VENTOLIN HFA) 108 (90 Base) MCG/ACT inhaler Inhale 1-2 puffs into the lungs every 6 (six) hours as needed for wheezing or shortness of breath. Patient not taking: Reported on 05/18/2023 01/24/23   Heilingoetter, Cassandra L, PA-C  amoxicillin-clavulanate (AUGMENTIN) 875-125 MG tablet Take 1 tablet by mouth 2 (two) times daily. Patient not taking: Reported on 05/18/2023 04/12/23   Charlott Holler, MD  B-D UF III MINI PEN NEEDLES 31G X 5 MM MISC Inject into the skin every morning. 07/02/21   [provider]  blood glucose meter kit and supplies KIT Use up to four times daily as directed 09/28/19   Cirigliano, Corrie Dandy K, DO  Continuous Glucose Sensor (DEXCOM G7 SENSOR) MISC 1 Device by Does not apply route continuous. Patient not taking: Reported on 05/18/2023 01/27/23    Altamese [place], MD  DEXILANT 30 MG capsule Take 1 capsule (30 mg total) by mouth daily. Patient not taking: Reported on 05/18/2023 06/17/21   Unk Lightning, PA  diphenhydrAMINE (BENADRYL) 2 % cream Apply 1 application topically as needed for itching.    [provider]  FARXIGA 10 MG TABS tablet Take 1 tablet (10 mg total) by mouth daily before breakfast. Follow-up appt due in Nov must see provider for future refills 03/28/23   [name], MD  Ferrous Sulfate (IRON) 325 (65 Fe) MG TABS Take 1 tablet (325 mg total) by mouth 2 (two) times daily with a meal. Patient not taking: Reported on 05/18/2023 12/25/20   Cirigliano, Corrie Dandy K, DO  fluticasone furoate-vilanterol (BREO ELLIPTA) 200-25 MCG/ACT AEPB Inhale 1 puff into the lungs daily. Patient not taking: Reported on 04/12/2023 09/14/21   [name], MD  insulin glargine (LANTUS SOLOSTAR) 100 UNIT/ML Solostar Pen Inject 34 Units into the skin daily. And 31G, 5mm pen needles 1/day 12/22/22   [name], MD  labetalol (NORMODYNE) 100 MG tablet Take 0.5 tablets (50 mg total) by mouth 2 (two) times daily. 02/11/23   [name], MD  levothyroxine (SYNTHROID) 75 MCG tablet Take 1 tablet by mouth once daily 01/17/23   [name], MD  losartan (COZAAR) 50 MG tablet Take 1 tablet by mouth once daily 03/15/23   [name], MD  Multiple Vitamin (MULTIVITAMIN) capsule Take 1 capsule by mouth daily.  [provider]  pravastatin (PRAVACHOL) 20 MG tablet Take 1 tablet (20 mg total) by mouth daily. 01/17/23   [name], MD      Allergies    Paclitaxel, Aspirin, Esomeprazole magnesium, and Ace inhibitors    Review of Systems   Review of Systems  All other systems reviewed and are negative.   Physical Exam Updated Vital Signs BP (!) 166/55   Pulse 73   Temp 98.3 F (36.8 C) (Oral)   Resp 17   Ht 5\' 5"  (1.651 m)   Wt 88 kg   SpO2 98%   BMI 32.28 kg/m  Physical  Exam Vitals and nursing note reviewed.   77 year old female, resting comfortably and in no acute distress. Vital signs are significant for elevated blood pressure. Oxygen saturation is 98%, which is normal. Head is normocephalic and atraumatic. PERRLA, EOMI. Oropharynx is clear.  There is no nystagmus. Neck is nontender and supple without adenopathy or JVD. Lungs are clear without rales, wheezes, or rhonchi. Chest is nontender. Heart has regular rate and rhythm without murmur. Abdomen is soft, flat, nontender. Extremities have no cyanosis or edema, full range of motion is present. Skin is warm and dry without rash. Neurologic: Mental status is normal, cranial nerves are intact, moves all extremities equally.  Summary headed feeling is reproduced by passive head movement.  Finger-nose testing is normal.  ED Results / Procedures / Treatments   Labs (all labs ordered are listed, but only abnormal results are displayed) Labs Reviewed  CBC - Abnormal; Notable for the following components:      Result Value   Hemoglobin 10.8 (*)    MCHC 29.8 (*)    RDW 16.1 (*)    All other components within normal limits  URINALYSIS, ROUTINE W REFLEX MICROSCOPIC - Abnormal; Notable for the following components:   Glucose, UA >=500 (*)    Protein, ur 100 (*)    All other components within normal limits  BASIC METABOLIC PANEL - Abnormal; Notable for the following components:   Glucose, Bld 188 (*)    BUN 25 (*)    Creatinine, Ser 1.40 (*)    GFR, Estimated 39 (*)    All other components within normal limits  CBG MONITORING, ED - Abnormal; Notable for the following components:   Glucose-Capillary 168 (*)    All other components within normal limits  CBG MONITORING, ED - Abnormal; Notable for the following components:   Glucose-Capillary 146 (*)    All other components within normal limits  CBG MONITORING, ED   Procedures Procedures  Cardiac monitor shows normal sinus rhythm, per my  interpretation.  Medications Ordered in ED Medications  meclizine (ANTIVERT) tablet 25 mg (has no administration in time range)    ED Course/ Medical Decision Making/ A&P                                 Medical Decision Making Amount and/or Complexity of Data Reviewed Labs: ordered.   Swimmy headed feeling which seems to be peripheral vertigo.  No red flags to suggest central vertigo.  Elevated blood pressure.  I reviewed her past records, and she has had intermittently elevated blood pressures at office visits over the last 6 months with about half of the recorded readings normal.  I have reviewed her laboratory tests, and my interpretation is glucose levels only mildly elevated and not high enough to account for  symptoms, stable anemia, renal insufficiency with creatinine having increased slightly from 05/03/2023, but in a range that she has been at before-no evidence of acute kidney injury.  I have discussed with the patient the need for blood pressure monitoring at home.  I have ordered a therapeutic trial of meclizine.  She feels much better following a dose of meclizine.  I am discharging her with a prescription for meclizine, advising her to monitor her blood pressure at home.  Follow-up with primary care provider as needed, return to the emergency department for new or concerning symptoms.  Final Clinical Impression(s) / ED Diagnoses Final diagnoses:  Peripheral vertigo, unspecified laterality  Renal insufficiency  Normocytic anemia  Elevated blood pressure reading with diagnosis of hypertension    Rx / DC Orders ED Discharge Orders          Ordered    meclizine (ANTIVERT) 25 MG tablet  3 times daily PRN        05/21/23 0508              Dione Booze, MD 05/21/23 [phone]

## 2023-05-21 NOTE — Discharge Instructions (Addendum)
Please check your blood pressure every day and keep a record of it.  Take that record with you when you see your primary care provider.  Multiple blood pressure readings from home will help her decide if you need to have your blood pressure medications adjusted.  Return if you have any new or concerning symptoms.

## 2023-05-21 NOTE — ED Notes (Signed)
Pt stated that she felt like "my sugar is dropping"Staff rechecked blood sugar, which was noted to still be slightly elevated. Pt declined any food or drink at this time

## 2023-05-21 NOTE — ED Notes (Signed)
Pt was wheel chaired to restroom and back to pt room.

## 2023-06-01 ENCOUNTER — Telehealth: Payer: Self-pay | Admitting: Nutrition

## 2023-06-01 NOTE — Telephone Encounter (Signed)
Patient says blood sugars dropping low 3 or more times per week during the night, around 3AM.  Says readings are in the 60s.  Is using a meter to test her blood sugar.   Please advise.

## 2023-06-03 ENCOUNTER — Encounter: Payer: Self-pay | Admitting: Internal Medicine

## 2023-06-03 ENCOUNTER — Ambulatory Visit (INDEPENDENT_AMBULATORY_CARE_PROVIDER_SITE_OTHER): Payer: 59 | Admitting: Internal Medicine

## 2023-06-03 VITALS — BP 130/60 | HR 84 | Ht 65.0 in | Wt 194.2 lb

## 2023-06-03 VITALS — BP 132/76 | HR 90 | Temp 98.3°F | Ht 65.0 in | Wt 193.0 lb

## 2023-06-03 DIAGNOSIS — R0602 Shortness of breath: Secondary | ICD-10-CM | POA: Diagnosis not present

## 2023-06-03 DIAGNOSIS — J90 Pleural effusion, not elsewhere classified: Secondary | ICD-10-CM | POA: Diagnosis not present

## 2023-06-03 DIAGNOSIS — C3491 Malignant neoplasm of unspecified part of right bronchus or lung: Secondary | ICD-10-CM | POA: Diagnosis not present

## 2023-06-03 DIAGNOSIS — R053 Chronic cough: Secondary | ICD-10-CM

## 2023-06-03 DIAGNOSIS — J432 Centrilobular emphysema: Secondary | ICD-10-CM

## 2023-06-03 NOTE — Progress Notes (Signed)
Subjective:   Patient ID: Jenna [name], female    DOB: 1946-01-05, 77 y.o.   MRN: [id number]  HPI The patient is a 77 YO female coming in for ER follow up (new RML effusion large, getting thoracentesis next week). She is having SOB and no recurrent dizziness. Struggling with the dyspnea. No fevers or cough. Sleeping with multiple pillows.   Review of Systems  Constitutional:  Positive for activity change.  HENT: Negative.    Eyes: Negative.   Respiratory:  Positive for shortness of breath. Negative for cough and chest tightness.   Cardiovascular:  Negative for chest pain, palpitations and leg swelling.  Gastrointestinal:  Negative for abdominal distention, abdominal pain, constipation, diarrhea, nausea and vomiting.  Musculoskeletal: Negative.   Skin: Negative.   Neurological: Negative.   Psychiatric/Behavioral: Negative.      Objective:  Physical Exam Constitutional:      Appearance: She is well-developed.  HENT:     Head: Normocephalic and atraumatic.  Cardiovascular:     Rate and Rhythm: Normal rate and regular rhythm.  Pulmonary:     Effort: Pulmonary effort is normal. No respiratory distress.     Breath sounds: No wheezing or rales.     Comments: Decreased breath sounds right Abdominal:     General: Bowel sounds are normal. There is no distension.     Palpations: Abdomen is soft.     Tenderness: There is no abdominal tenderness. There is no rebound.  Musculoskeletal:     Cervical back: Normal range of motion.  Skin:    General: Skin is warm and dry.  Neurological:     Mental Status: She is alert and oriented to person, place, and time.     Coordination: Coordination normal.     Vitals:   06/03/23 1426 06/03/23 1429 06/03/23 1541  BP: (!) 176/80 (!) 176/80 132/76  Pulse: 90    Temp: 98.3 F (36.8 C)    TempSrc: Oral    SpO2: 98%    Weight: 193 lb (87.5 kg)    Height: 5\' 5"  (1.651 m)      Assessment & Plan:

## 2023-06-03 NOTE — Patient Instructions (Addendum)
It was a pleasure to see you today!  Please schedule follow up scheduled with myself in 3 months.  If my schedule is not open yet, we will contact you with a reminder closer to that time. Please call [phone] if you haven't heard from [place] a month before, and always call us sooner if issues or concerns arise. You can also send [place] a message through MyChart, but but aware that this is not to be used for urgent issues and it may take up to 5-7 days to receive a reply. Please be aware that you will likely be able to view your results before I have a chance to respond to them. Please give [place] 5 business days to respond to any non-urgent results.    Continue Breo with albuterol as needed.   I am setting you up for a thoracentesis (procedure to drain fluid around your lung) at 2:00 pm at [facility] - endoscopy. This will be Wednesday 06-08-23. Please show up at 1:45 to the main desk and they will direct you. You can still take all your medications and eat like normal that morning.   We can also consider putting in something called a Pleurx catheter which would remain in your chest for a few weeks but the hope is that the fluid drains complete dry. This is an option for you if you are interested.

## 2023-06-03 NOTE — Assessment & Plan Note (Signed)
Due to large effusion and getting thoracentesis next week. Has had 4 times recently. She understands that no malignancy was found in sampling but this is not 100 % good at ruling this out.

## 2023-06-03 NOTE — H&P (View-Only) (Signed)
Jenna [name]    [id number]    04-Jan-1946  Primary Care Physician:Crawford, Austin Miles, MD Date of Appointment: 06/03/2023 Established Patient Visit  Chief complaint:   Chief Complaint  Patient presents with   Follow-up    Pt is here for Pneumonia of right lung due to infectious organism F/U visit. Pt states that her SOB is the same as last visit.     HPI: Jenna [name] is a 77 y.o. woman with h/o tobacco use disorder and mild emphysema. 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 Also with changes of radiation fibrosis.   Interval Updates: Here for follow up. CT Chest shows recurrence of right pleural effusion. Cytology has already been negative x 3. I treated her empirically with augmentin at the last visit for presumed post-obstructive pneumonia since cultures were not sent with her bronchoscopy in August 2024. Augmentin helped her cough a little.    Still short of breath, taking breo with albuterol as needed.   Weight is stable. Appetite is ok.    I have reviewed the patient's family social and past medical history and updated as appropriate.   Past Medical History:  Diagnosis Date   Blood transfusion without reported diagnosis    Cataract    DM (diabetes mellitus) (HCC)    Dyspnea    GERD (gastroesophageal reflux disease)    Hyperlipidemia    Hypertension    Iron deficiency anemia    NSCL ca dx'd 03/2018   Pneumonia    Thyroid disease     Past Surgical History:  Procedure Laterality Date   BIOPSY  12/09/2022   Procedure: BIOPSY;  Surgeon: Benancio Deeds, MD;  Location: [facility];  Service: Gastroenterology;;   BRONCHIAL BIOPSY  04/05/2023   Procedure: BRONCHIAL BIOPSIES;  Surgeon: Luciano Cutter, MD;  Location: [facility];  Service: Cardiopulmonary;;   BRONCHIAL BRUSHINGS  04/05/2023   Procedure: BRONCHIAL BRUSHINGS;  Surgeon: Luciano Cutter, MD;  Location: [facility];  Service:  Cardiopulmonary;;   BRONCHIAL WASHINGS  04/05/2023   Procedure: BRONCHIAL WASHINGS;  Surgeon: Luciano Cutter, MD;  Location: [facility];  Service: Cardiopulmonary;;   CATARACT EXTRACTION Right    CHOLECYSTECTOMY     COLONOSCOPY  10/24/2009   normal rectum/1X1cm abnormal lesion in the ascending colon (bx benign). TI normal for 10cm.  Prep difficult/inadequate. f/u TCS 09/2012 recommended   COLONOSCOPY  10/13/2004   Normal rectum/Diminutive polyps, splenic flexure, cold biopsied/removed.  Remainder of colonic mucosa appeared normal.   COLONOSCOPY N/A 12/14/2012   FAO:ZHYQMVH polyp-tubular adenoma   COLONOSCOPY WITH PROPOFOL N/A 12/09/2022   Procedure: COLONOSCOPY WITH PROPOFOL;  Surgeon: Benancio Deeds, MD;  Location: [facility];  Service: Gastroenterology;  Laterality: N/A;   ESOPHAGOGASTRODUODENOSCOPY  10/13/2004    Normal esophagus/ Nodular volcano like lesion in the antrum, either representing a  pancreatic rest or leiomyoma, biopsied.  Remainder of the gastric mucosa appeared normal, normal D1-D2   ESOPHAGOGASTRODUODENOSCOPY  10/24/2009   Benign biopsies. normal esophagus/small hiatal hernia/nodular lesion antrum/distal greater curvature. duodenal AVM s/p ablation   GIVENS CAPSULE STUDY  07/27/2010    multiple arteriovenous malformations which could definitely be the contributor to her drifting hemoglobin and hematocrit   HEMOSTASIS CONTROL  04/05/2023   Procedure: HEMOSTASIS CONTROL;  Surgeon: Luciano Cutter, MD;  Location: [facility];  Service: Cardiopulmonary;;   HOT HEMOSTASIS N/A 12/09/2022   Procedure: HOT HEMOSTASIS (ARGON PLASMA COAGULATION/BICAP);  Surgeon: Ileene Patrick  P, MD;  Location: [facility];  Service: Gastroenterology;  Laterality: N/A;   IR THORACENTESIS ASP PLEURAL SPACE W/IMG GUIDE  12/16/2020   POLYPECTOMY  12/09/2022   Procedure: POLYPECTOMY;  Surgeon: Benancio Deeds, MD;  Location: [facility];  Service: Gastroenterology;;   THORACENTESIS   04/05/2023   Procedure: THORACENTESIS;  Surgeon: Luciano Cutter, MD;  Location: [facility];  Service: Cardiopulmonary;;   TUBAL LIGATION     US ECHOCARDIOGRAPHY     VIDEO BRONCHOSCOPY N/A 04/05/2023   Procedure: VIDEO BRONCHOSCOPY WITHOUT FLUORO;  Surgeon: Luciano Cutter, MD;  Location: [facility];  Service: Cardiopulmonary;  Laterality: N/A;   VIDEO BRONCHOSCOPY WITH ENDOBRONCHIAL ULTRASOUND N/A 04/27/2018   Procedure: VIDEO BRONCHOSCOPY WITH ENDOBRONCHIAL ULTRASOUND;  Surgeon: Loreli Slot, MD;  Location: [facility];  Service: Thoracic;  Laterality: N/A;    Family History  Problem Relation Age of Onset   Diabetes Sister    Colon cancer Maternal Aunt        greater than age 62   Breast cancer Cousin    Diabetes Daughter    Diabetes Daughter    Amblyopia Neg Hx    Blindness Neg Hx    Cataracts Neg Hx    Glaucoma Neg Hx    Macular degeneration Neg Hx    Retinal detachment Neg Hx    Strabismus Neg Hx    Retinitis pigmentosa Neg Hx    Rectal cancer Neg Hx    Stomach cancer Neg Hx    Colon polyps Neg Hx    Esophageal cancer Neg Hx     Social History   Occupational History   Occupation: retired  Tobacco Use   Smoking status: Former    Current packs/day: 0.00    Average packs/day: 0.5 packs/day for 55.0 years (27.5 ttl pk-yrs)    Types: Cigarettes    Start date: 03/19/1963    Quit date: 03/18/2018    Years since quitting: 5.2   Smokeless tobacco: Never   Tobacco comments:    smoked off and n  Vaping Use   Vaping status: Never Used  Substance and Sexual Activity   Alcohol use: No   Drug use: No   Sexual activity: Not Currently     Physical Exam: Blood pressure 130/60, pulse 84, height 5\' 5"  (1.651 m), weight 194 lb 3.2 oz (88.1 kg), SpO2 98%.  Gen:      No acute distress Lungs:    breath sounds diminished right lung base, no wheeze CV:         RRR, no mrg   Data Reviewed: Imaging: CT Chest reviewed  PFTs:     Latest Ref Rng & Units 03/15/2022    10:43 AM 05/11/2018    2:33 PM  PFT Results  FVC-Pre L 1.23    FVC-Predicted Pre % 41  71   FVC-Post L 1.13  1.77   FVC-Predicted Post % 38  73   Pre FEV1/FVC % % 80  83   Post FEV1/FCV % % 80  82   FEV1-Pre L 0.98  1.44   FEV1-Predicted Pre % 44  76   FEV1-Post L 0.91  1.46   DLCO uncorrected ml/min/mmHg 7.37  10.35   DLCO UNC% % 37  40   DLCO corrected ml/min/mmHg 7.37  12.65   DLCO COR %Predicted % 37  49   DLVA Predicted % 64  79   TLC L 3.62  4.30   TLC % Predicted % 69  82  RV % Predicted % 88  104    I have personally reviewed the patient's PFTs and show severe restriction to ventilation with reduced diffusion capacity.   Labs:  Immunization status: Immunization History  Administered Date(s) Administered   Fluad Quad(high Dose 65+) 05/31/2019, 09/12/2020   Influenza-Unspecified 04/16/2014   PFIZER(Purple Top)SARS-COV-2 Vaccination 11/22/2019, 12/17/2019   Pneumococcal Conjugate-13 06/10/2014   Pneumococcal Polysaccharide-23 04/19/2013    External Records Personally Reviewed: oncology, primary care.   Assessment:  NSCLC Stage 3B s/p chemotherapy and radiation with concern for recurrence.  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 Now worsened in the setting of right lung collapse.  Emphysema, mild History of tobacco use, quit 2019 Recurrent right pleural effusion Chronic cough, likely reflux  Plan/Recommendations: Continue Breo with albuterol prn  Bronchoscopy and endobronchial biopsies negative for recurrent malignancy. I'm not sure what is causing the narrowing of her airway. However full studies were not sent and sampling was suboptimal. She may also benefit from repeat bronchoscopy at some point. Could also do a pet scan. Will discuss.   Right pleural effusion s/p thoracentesis - cytology negative x 2019, 2022, 2024.  Will set her up for outpatient thoracentesis with ultrasound next week at [facility]. She is agreeable to proceed. Discussed with Dr. Shirline Frees.   I discussed pleurx catheter with her and she is hesitant about this but I will give her information to think it over.    Return to Care: Return in about 3 months (around 09/03/2023).   Durel Salts, MD Pulmonary and Critical Care Medicine [facility] Office:[phone]

## 2023-06-03 NOTE — Progress Notes (Signed)
Jenna [name]    [id number]    04-Jan-1946  Primary Care Physician:Crawford, Austin Miles, MD Date of Appointment: 06/03/2023 Established Patient Visit  Chief complaint:   Chief Complaint  Patient presents with   Follow-up    Pt is here for Pneumonia of right lung due to infectious organism F/U visit. Pt states that her SOB is the same as last visit.     HPI: Jenna [name] is a 77 y.o. woman with h/o tobacco use disorder and mild emphysema. 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 Also with changes of radiation fibrosis.   Interval Updates: Here for follow up. CT Chest shows recurrence of right pleural effusion. Cytology has already been negative x 3. I treated her empirically with augmentin at the last visit for presumed post-obstructive pneumonia since cultures were not sent with her bronchoscopy in August 2024. Augmentin helped her cough a little.    Still short of breath, taking breo with albuterol as needed.   Weight is stable. Appetite is ok.    I have reviewed the patient's family social and past medical history and updated as appropriate.   Past Medical History:  Diagnosis Date   Blood transfusion without reported diagnosis    Cataract    DM (diabetes mellitus) (HCC)    Dyspnea    GERD (gastroesophageal reflux disease)    Hyperlipidemia    Hypertension    Iron deficiency anemia    NSCL ca dx'd 03/2018   Pneumonia    Thyroid disease     Past Surgical History:  Procedure Laterality Date   BIOPSY  12/09/2022   Procedure: BIOPSY;  Surgeon: Benancio Deeds, MD;  Location: [facility];  Service: Gastroenterology;;   BRONCHIAL BIOPSY  04/05/2023   Procedure: BRONCHIAL BIOPSIES;  Surgeon: Luciano Cutter, MD;  Location: [facility];  Service: Cardiopulmonary;;   BRONCHIAL BRUSHINGS  04/05/2023   Procedure: BRONCHIAL BRUSHINGS;  Surgeon: Luciano Cutter, MD;  Location: [facility];  Service:  Cardiopulmonary;;   BRONCHIAL WASHINGS  04/05/2023   Procedure: BRONCHIAL WASHINGS;  Surgeon: Luciano Cutter, MD;  Location: [facility];  Service: Cardiopulmonary;;   CATARACT EXTRACTION Right    CHOLECYSTECTOMY     COLONOSCOPY  10/24/2009   normal rectum/1X1cm abnormal lesion in the ascending colon (bx benign). TI normal for 10cm.  Prep difficult/inadequate. f/u TCS 09/2012 recommended   COLONOSCOPY  10/13/2004   Normal rectum/Diminutive polyps, splenic flexure, cold biopsied/removed.  Remainder of colonic mucosa appeared normal.   COLONOSCOPY N/A 12/14/2012   FAO:ZHYQMVH polyp-tubular adenoma   COLONOSCOPY WITH PROPOFOL N/A 12/09/2022   Procedure: COLONOSCOPY WITH PROPOFOL;  Surgeon: Benancio Deeds, MD;  Location: [facility];  Service: Gastroenterology;  Laterality: N/A;   ESOPHAGOGASTRODUODENOSCOPY  10/13/2004    Normal esophagus/ Nodular volcano like lesion in the antrum, either representing a  pancreatic rest or leiomyoma, biopsied.  Remainder of the gastric mucosa appeared normal, normal D1-D2   ESOPHAGOGASTRODUODENOSCOPY  10/24/2009   Benign biopsies. normal esophagus/small hiatal hernia/nodular lesion antrum/distal greater curvature. duodenal AVM s/p ablation   GIVENS CAPSULE STUDY  07/27/2010    multiple arteriovenous malformations which could definitely be the contributor to her drifting hemoglobin and hematocrit   HEMOSTASIS CONTROL  04/05/2023   Procedure: HEMOSTASIS CONTROL;  Surgeon: Luciano Cutter, MD;  Location: [facility];  Service: Cardiopulmonary;;   HOT HEMOSTASIS N/A 12/09/2022   Procedure: HOT HEMOSTASIS (ARGON PLASMA COAGULATION/BICAP);  Surgeon: Ileene Patrick  P, MD;  Location: [facility];  Service: Gastroenterology;  Laterality: N/A;   IR THORACENTESIS ASP PLEURAL SPACE W/IMG GUIDE  12/16/2020   POLYPECTOMY  12/09/2022   Procedure: POLYPECTOMY;  Surgeon: Benancio Deeds, MD;  Location: [facility];  Service: Gastroenterology;;   THORACENTESIS   04/05/2023   Procedure: THORACENTESIS;  Surgeon: Luciano Cutter, MD;  Location: [facility];  Service: Cardiopulmonary;;   TUBAL LIGATION     US ECHOCARDIOGRAPHY     VIDEO BRONCHOSCOPY N/A 04/05/2023   Procedure: VIDEO BRONCHOSCOPY WITHOUT FLUORO;  Surgeon: Luciano Cutter, MD;  Location: [facility];  Service: Cardiopulmonary;  Laterality: N/A;   VIDEO BRONCHOSCOPY WITH ENDOBRONCHIAL ULTRASOUND N/A 04/27/2018   Procedure: VIDEO BRONCHOSCOPY WITH ENDOBRONCHIAL ULTRASOUND;  Surgeon: Loreli Slot, MD;  Location: [facility];  Service: Thoracic;  Laterality: N/A;    Family History  Problem Relation Age of Onset   Diabetes Sister    Colon cancer Maternal Aunt        greater than age 62   Breast cancer Cousin    Diabetes Daughter    Diabetes Daughter    Amblyopia Neg Hx    Blindness Neg Hx    Cataracts Neg Hx    Glaucoma Neg Hx    Macular degeneration Neg Hx    Retinal detachment Neg Hx    Strabismus Neg Hx    Retinitis pigmentosa Neg Hx    Rectal cancer Neg Hx    Stomach cancer Neg Hx    Colon polyps Neg Hx    Esophageal cancer Neg Hx     Social History   Occupational History   Occupation: retired  Tobacco Use   Smoking status: Former    Current packs/day: 0.00    Average packs/day: 0.5 packs/day for 55.0 years (27.5 ttl pk-yrs)    Types: Cigarettes    Start date: 03/19/1963    Quit date: 03/18/2018    Years since quitting: 5.2   Smokeless tobacco: Never   Tobacco comments:    smoked off and n  Vaping Use   Vaping status: Never Used  Substance and Sexual Activity   Alcohol use: No   Drug use: No   Sexual activity: Not Currently     Physical Exam: Blood pressure 130/60, pulse 84, height 5\' 5"  (1.651 m), weight 194 lb 3.2 oz (88.1 kg), SpO2 98%.  Gen:      No acute distress Lungs:    breath sounds diminished right lung base, no wheeze CV:         RRR, no mrg   Data Reviewed: Imaging: CT Chest reviewed  PFTs:     Latest Ref Rng & Units 03/15/2022    10:43 AM 05/11/2018    2:33 PM  PFT Results  FVC-Pre L 1.23    FVC-Predicted Pre % 41  71   FVC-Post L 1.13  1.77   FVC-Predicted Post % 38  73   Pre FEV1/FVC % % 80  83   Post FEV1/FCV % % 80  82   FEV1-Pre L 0.98  1.44   FEV1-Predicted Pre % 44  76   FEV1-Post L 0.91  1.46   DLCO uncorrected ml/min/mmHg 7.37  10.35   DLCO UNC% % 37  40   DLCO corrected ml/min/mmHg 7.37  12.65   DLCO COR %Predicted % 37  49   DLVA Predicted % 64  79   TLC L 3.62  4.30   TLC % Predicted % 69  82  RV % Predicted % 88  104    I have personally reviewed the patient's PFTs and show severe restriction to ventilation with reduced diffusion capacity.   Labs:  Immunization status: Immunization History  Administered Date(s) Administered   Fluad Quad(high Dose 65+) 05/31/2019, 09/12/2020   Influenza-Unspecified 04/16/2014   PFIZER(Purple Top)SARS-COV-2 Vaccination 11/22/2019, 12/17/2019   Pneumococcal Conjugate-13 06/10/2014   Pneumococcal Polysaccharide-23 04/19/2013    External Records Personally Reviewed: oncology, primary care.   Assessment:  NSCLC Stage 3B s/p chemotherapy and radiation with concern for recurrence.  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 Now worsened in the setting of right lung collapse.  Emphysema, mild History of tobacco use, quit 2019 Recurrent right pleural effusion Chronic cough, likely reflux  Plan/Recommendations: Continue Breo with albuterol prn  Bronchoscopy and endobronchial biopsies negative for recurrent malignancy. I'm not sure what is causing the narrowing of her airway. However full studies were not sent and sampling was suboptimal. She may also benefit from repeat bronchoscopy at some point. Could also do a pet scan. Will discuss.   Right pleural effusion s/p thoracentesis - cytology negative x 2019, 2022, 2024.  Will set her up for outpatient thoracentesis with ultrasound next week at [facility]. She is agreeable to proceed. Discussed with Dr. Shirline Frees.   I discussed pleurx catheter with her and she is hesitant about this but I will give her information to think it over.    Return to Care: Return in about 3 months (around 09/03/2023).   Durel Salts, MD Pulmonary and Critical Care Medicine [facility] Office:[phone]

## 2023-06-06 NOTE — Telephone Encounter (Signed)
Patient told to reduce her lantus dose by 2 units.  She reported good understanding of this and had no final questions.

## 2023-06-08 ENCOUNTER — Other Ambulatory Visit: Payer: Self-pay

## 2023-06-08 ENCOUNTER — Ambulatory Visit (HOSPITAL_COMMUNITY)
Admission: RE | Admit: 2023-06-08 | Discharge: 2023-06-08 | Disposition: A | Discharge status: Home / Self Care | Payer: 59 | Attending: Internal Medicine | Admitting: Internal Medicine

## 2023-06-08 ENCOUNTER — Ambulatory Visit (HOSPITAL_COMMUNITY): Payer: 59

## 2023-06-08 ENCOUNTER — Encounter (HOSPITAL_COMMUNITY): Payer: Self-pay | Admitting: Internal Medicine

## 2023-06-08 ENCOUNTER — Encounter (HOSPITAL_COMMUNITY)
Admission: RE | Disposition: A | Discharge status: Home / Self Care | Payer: Self-pay | Source: Home / Self Care | Attending: Internal Medicine

## 2023-06-08 DIAGNOSIS — Z87891 Personal history of nicotine dependence: Secondary | ICD-10-CM | POA: Insufficient documentation

## 2023-06-08 DIAGNOSIS — J439 Emphysema, unspecified: Secondary | ICD-10-CM | POA: Insufficient documentation

## 2023-06-08 DIAGNOSIS — J189 Pneumonia, unspecified organism: Secondary | ICD-10-CM | POA: Insufficient documentation

## 2023-06-08 DIAGNOSIS — Z48813 Encounter for surgical aftercare following surgery on the respiratory system: Secondary | ICD-10-CM | POA: Diagnosis not present

## 2023-06-08 DIAGNOSIS — J90 Pleural effusion, not elsewhere classified: Secondary | ICD-10-CM | POA: Insufficient documentation

## 2023-06-08 DIAGNOSIS — J9811 Atelectasis: Secondary | ICD-10-CM | POA: Diagnosis not present

## 2023-06-08 DIAGNOSIS — Z923 Personal history of irradiation: Secondary | ICD-10-CM | POA: Diagnosis not present

## 2023-06-08 DIAGNOSIS — Z9221 Personal history of antineoplastic chemotherapy: Secondary | ICD-10-CM | POA: Diagnosis not present

## 2023-06-08 DIAGNOSIS — J939 Pneumothorax, unspecified: Secondary | ICD-10-CM | POA: Diagnosis not present

## 2023-06-08 DIAGNOSIS — Z85118 Personal history of other malignant neoplasm of bronchus and lung: Secondary | ICD-10-CM | POA: Diagnosis not present

## 2023-06-08 DIAGNOSIS — Z08 Encounter for follow-up examination after completed treatment for malignant neoplasm: Secondary | ICD-10-CM | POA: Diagnosis not present

## 2023-06-08 DIAGNOSIS — R918 Other nonspecific abnormal finding of lung field: Secondary | ICD-10-CM | POA: Diagnosis not present

## 2023-06-08 HISTORY — PX: THORACENTESIS: SHX235

## 2023-06-08 SURGERY — THORACENTESIS
Anesthesia: LOCAL | Laterality: Right

## 2023-06-08 NOTE — Progress Notes (Signed)
Postprocedure CXR with incomplete expansion; think this is just airway narrowing/fibrosis from prior treatment. leading to a potential space that fills up with fluid.  This would explain lack of breathing improvement after thora, CXR findings, neg bronch, and her pain toward end of procedure.  Would screen with q60mo CT for 2 years then annual with PET PRN if there are soft tissue changes.  Myrla Halsted MD PCCM

## 2023-06-08 NOTE — Op Note (Signed)
Thoracentesis  Procedure Note  Jenna [name]  [id number]  27-Nov-1945  Date:06/08/23  Time:1:56 PM   Provider Performing:Illias Pantano C Katrinka Blazing   Procedure: Thoracentesis with imaging guidance (87564)  Indication(s) Pleural Effusion  Consent Risks of the procedure as well as the alternatives and risks of each were explained to the patient and/or caregiver.  Consent for the procedure was obtained and is signed in the bedside chart  Anesthesia Topical only with 1% lidocaine    Time Out Verified patient identification, verified procedure, site/side was marked, verified correct patient position, special equipment/implants available, medications/allergies/relevant history reviewed, required imaging and test results available.   Sterile Technique Maximal sterile technique including full sterile barrier drape, hand hygiene, sterile gown, sterile gloves, mask, hair covering, sterile ultrasound probe cover (if used).  Procedure Description Ultrasound was used to identify appropriate pleural anatomy for placement and overlying skin marked.  Area of drainage cleaned and draped in sterile fashion. Lidocaine was used to anesthetize the skin and subcutaneous tissue.  1200 cc's of straw appearing fluid was drained from the right pleural space. Catheter then removed and bandaid applied to site.   Complications/Tolerance None; patient tolerated the procedure well. Chest X-ray is ordered to confirm no post-procedural complication.   EBL Minimal   Specimen(s) Pleural fluid

## 2023-06-08 NOTE — Interval H&P Note (Signed)
Agreeable to thoracentesis

## 2023-06-09 ENCOUNTER — Encounter: Payer: Self-pay | Admitting: "Endocrinology

## 2023-06-09 ENCOUNTER — Ambulatory Visit (INDEPENDENT_AMBULATORY_CARE_PROVIDER_SITE_OTHER): Payer: 59 | Admitting: "Endocrinology

## 2023-06-09 VITALS — BP 120/60 | HR 78 | Ht 60.0 in | Wt 191.6 lb

## 2023-06-09 DIAGNOSIS — Z7984 Long term (current) use of oral hypoglycemic drugs: Secondary | ICD-10-CM

## 2023-06-09 DIAGNOSIS — Z794 Long term (current) use of insulin: Secondary | ICD-10-CM

## 2023-06-09 DIAGNOSIS — E11649 Type 2 diabetes mellitus with hypoglycemia without coma: Secondary | ICD-10-CM

## 2023-06-09 MED ORDER — FARXIGA 10 MG PO TABS
10.0000 mg | ORAL_TABLET | Freq: Every day | ORAL | 1 refills | Status: DC
Start: 2023-06-09 — End: 2023-12-23

## 2023-06-09 NOTE — Progress Notes (Signed)
Outpatient Endocrinology Note Jenna Marion, MD  06/09/23   Jenna [name] 1946/03/21 [id number]  Referring Provider: [name], * Primary Care Provider: [name], MD Reason for consultation: Subjective   Assessment & Plan  Diagnoses and all orders for this visit:  Uncontrolled type 2 diabetes mellitus with hypoglycemia without coma (HCC) -     Ambulatory referral to diabetic education -     FARXIGA 10 MG TABS tablet; Take 1 tablet (10 mg total) by mouth daily before breakfast. Follow-up appt due in Nov must see provider for future refills  Long term (current) use of oral hypoglycemic drugs  Long-term insulin use (HCC)   Diabetes complicated by neuropathy  Hba1c goal less than 7.0, current Hba1c is 6.3. Will recommend the following: Lantus 30 units qam Farxiga 10 mg qd  No known contraindications to any of above medications  Hyperlipidemia -Last LDL at goal: 74 -on pravastatin 20 mg QD -Follow low fat diet and exercise   -Blood pressure goal <140/90 - Microalbumin/creatinine off goal at 37.5 - on ACE/ARB Losartan 50 mg qd  -diet changes including salt restriction -limit eating outside -counseled BP targets per standards of diabetes care -Uncontrolled blood pressure can lead to retinopathy, nephropathy and cardiovascular and atherosclerotic heart disease  Reviewed and counseled on: -A1C target -Blood sugar targets -Complications of uncontrolled diabetes  -Checking blood sugar before meals and bedtime and bring log next visit -All medications with mechanism of action and side effects -Hypoglycemia management: rule of 15's, Glucagon Emergency Kit and medical alert ID -low-carb low-fat plate-method diet -At least 20 minutes of physical activity per day -Annual dilated retinal eye exam and foot exam -compliance and follow up needs -follow up as scheduled or earlier if problem gets worse  Call if blood sugar is less than 70  or consistently above 250    Take a 15 gm snack of carbohydrate at bedtime before you go to sleep if your blood sugar is less than 100.    If you are going to fast after midnight for a test or procedure, ask your physician for instructions on how to reduce/decrease your insulin dose.    Call if blood sugar is less than 70 or consistently above 250  -Treating a low sugar by rule of 15  (15 gms of sugar every 15 min until sugar is more than 70) If you feel your sugar is low, test your sugar to be sure If your sugar is low (less than 70), then take 15 grams of a fast acting Carbohydrate (3-4 glucose tablets or glucose gel or 4 ounces of juice or regular soda) Recheck your sugar 15 min after treating low to make sure it is more than 70 If sugar is still less than 70, treat again with 15 grams of carbohydrate          Don't drive the hour of hypoglycemia  If unconscious/unable to eat or drink by mouth, use glucagon injection or nasal spray baqsimi and call 911. Can repeat again in 15 min if still unconscious.  Return in about 3 months (around 09/09/2023).   I have reviewed current medications, nurse's notes, allergies, vital signs, past medical and surgical history, family medical history, and social history for this encounter. Counseled patient on symptoms, examination findings, lab findings, imaging results, treatment decisions and monitoring and prognosis. The patient understood the recommendations and agrees with the treatment plan. All questions regarding treatment plan were fully answered.  Jenna Colfax, MD  06/09/23   History of Present Illness Jenna [name] is a 77 y.o. year old female who presents for follow up for Type 2 diabetes mellitus.  Jenna [name] was first diagnosed in 2000.   Diabetes education +  Home diabetes regimen: Lantus 32 units qam Farxiga 10 mg qd  COMPLICATIONS -  MI/Stroke, + PAD -  retinopathy +  neuropathy +  nephropathy, GFR  45  BLOOD SUGAR DATA  CGM interpretation: At today's visit, we reviewed her CGM downloads. The full report is scanned in the media. Reviewing the CGM trends, BG are well controlled across the day, with some lows overnight.  Physical Exam  BP 120/60   Pulse 78   Ht 5' (1.524 m)   Wt 191 lb 9.6 oz (86.9 kg)   SpO2 97%   BMI 37.42 kg/m    Constitutional: well developed, well nourished Head: normocephalic, atraumatic Eyes: sclera anicteric, no redness Neck: supple Lungs: normal respiratory effort Neurology: alert and oriented Skin: dry, no appreciable rashes Musculoskeletal: no appreciable defects Psychiatric: normal mood and affect  Current Medications Patient's Medications  New Prescriptions   No medications on file  Previous Medications   ALBUTEROL (VENTOLIN HFA) 108 (90 BASE) MCG/ACT INHALER    Inhale 1-2 puffs into the lungs every 6 (six) hours as needed for wheezing or shortness of breath.   B-D UF III MINI PEN NEEDLES 31G X 5 MM MISC    Inject into the skin every morning.   BLOOD GLUCOSE METER KIT AND SUPPLIES KIT    Use up to four times daily as directed   CONTINUOUS GLUCOSE SENSOR (DEXCOM G7 SENSOR) MISC    1 Device by Does not apply route continuous.   DEXILANT 30 MG CAPSULE    Take 1 capsule (30 mg total) by mouth daily.   DIPHENHYDRAMINE (BENADRYL) 2 % CREAM    Apply 1 application topically as needed for itching.   FERROUS SULFATE (IRON) 325 (65 FE) MG TABS    Take 1 tablet (325 mg total) by mouth 2 (two) times daily with a meal.   FLUTICASONE FUROATE-VILANTEROL (BREO ELLIPTA) 200-25 MCG/ACT AEPB    Inhale 1 puff into the lungs daily.   INSULIN GLARGINE (LANTUS SOLOSTAR) 100 UNIT/ML SOLOSTAR PEN    Inject 34 Units into the skin daily. And 31G, 5mm pen needles 1/day   LABETALOL (NORMODYNE) 100 MG TABLET    Take 0.5 tablets (50 mg total) by mouth 2 (two) times daily.   LEVOTHYROXINE (SYNTHROID) 75 MCG TABLET    Take 1 tablet by mouth once daily   LOSARTAN (COZAAR) 50 MG  TABLET    Take 1 tablet by mouth once daily   MECLIZINE (ANTIVERT) 25 MG TABLET    Take 1 tablet (25 mg total) by mouth 3 (three) times daily as needed for dizziness.   MULTIPLE VITAMIN (MULTIVITAMIN) CAPSULE    Take 1 capsule by mouth daily.   PRAVASTATIN (PRAVACHOL) 20 MG TABLET    Take 1 tablet (20 mg total) by mouth daily.  Modified Medications   Modified Medication Previous Medication   FARXIGA 10 MG TABS TABLET FARXIGA 10 MG TABS tablet      Take 1 tablet (10 mg total) by mouth daily before breakfast. Follow-up appt due in Nov must see provider for future refills    Take 1 tablet (10 mg total) by mouth daily before breakfast. Follow-up appt due in Nov must see provider for future refills  Discontinued Medications   No medications  on file    Allergies Allergies  Allergen Reactions   Paclitaxel Other (See Comments)    Unresponsiveness shortly after Taxol inf started 06/12/18.   Aspirin Other (See Comments)    Stomach bleeding    Esomeprazole Magnesium     UNSPECIFIED REACTION    Ace Inhibitors Other (See Comments)    Dizziness, drunk like    Past Medical History Past Medical History:  Diagnosis Date   Blood transfusion without reported diagnosis    Cataract    DM (diabetes mellitus) (HCC)    Dyspnea    GERD (gastroesophageal reflux disease)    Hyperlipidemia    Hypertension    Iron deficiency anemia    NSCL ca dx'd 03/2018   Pneumonia    Thyroid disease     Past Surgical History Past Surgical History:  Procedure Laterality Date   BIOPSY  12/09/2022   Procedure: BIOPSY;  Surgeon: Benancio Deeds, MD;  Location: [facility];  Service: Gastroenterology;;   BRONCHIAL BIOPSY  04/05/2023   Procedure: BRONCHIAL BIOPSIES;  Surgeon: Luciano Cutter, MD;  Location: [facility];  Service: Cardiopulmonary;;   BRONCHIAL BRUSHINGS  04/05/2023   Procedure: BRONCHIAL BRUSHINGS;  Surgeon: Luciano Cutter, MD;  Location: [facility];  Service: Cardiopulmonary;;   BRONCHIAL  WASHINGS  04/05/2023   Procedure: BRONCHIAL WASHINGS;  Surgeon: Luciano Cutter, MD;  Location: [facility];  Service: Cardiopulmonary;;   CATARACT EXTRACTION Right    CHOLECYSTECTOMY     COLONOSCOPY  10/24/2009   normal rectum/1X1cm abnormal lesion in the ascending colon (bx benign). TI normal for 10cm.  Prep difficult/inadequate. f/u TCS 09/2012 recommended   COLONOSCOPY  10/13/2004   Normal rectum/Diminutive polyps, splenic flexure, cold biopsied/removed.  Remainder of colonic mucosa appeared normal.   COLONOSCOPY N/A 12/14/2012   UYQ:[id number] polyp-tubular adenoma   COLONOSCOPY WITH PROPOFOL N/A 12/09/2022   Procedure: COLONOSCOPY WITH PROPOFOL;  Surgeon: Benancio Deeds, MD;  Location: [facility];  Service: Gastroenterology;  Laterality: N/A;   ESOPHAGOGASTRODUODENOSCOPY  10/13/2004    Normal esophagus/ Nodular volcano like lesion in the antrum, either representing a  pancreatic rest or leiomyoma, biopsied.  Remainder of the gastric mucosa appeared normal, normal D1-D2   ESOPHAGOGASTRODUODENOSCOPY  10/24/2009   Benign biopsies. normal esophagus/small hiatal hernia/nodular lesion antrum/distal greater curvature. duodenal AVM s/p ablation   GIVENS CAPSULE STUDY  07/27/2010    multiple arteriovenous malformations which could definitely be the contributor to her drifting hemoglobin and hematocrit   HEMOSTASIS CONTROL  04/05/2023   Procedure: HEMOSTASIS CONTROL;  Surgeon: Luciano Cutter, MD;  Location: [facility];  Service: Cardiopulmonary;;   HOT HEMOSTASIS N/A 12/09/2022   Procedure: HOT HEMOSTASIS (ARGON PLASMA COAGULATION/BICAP);  Surgeon: Benancio Deeds, MD;  Location: [facility];  Service: Gastroenterology;  Laterality: N/A;   IR THORACENTESIS ASP PLEURAL SPACE W/IMG GUIDE  12/16/2020   POLYPECTOMY  12/09/2022   Procedure: POLYPECTOMY;  Surgeon: Benancio Deeds, MD;  Location: [facility];  Service: Gastroenterology;;   THORACENTESIS  04/05/2023   Procedure:  THORACENTESIS;  Surgeon: Luciano Cutter, MD;  Location: [facility];  Service: Cardiopulmonary;;   TUBAL LIGATION     US ECHOCARDIOGRAPHY     VIDEO BRONCHOSCOPY N/A 04/05/2023   Procedure: VIDEO BRONCHOSCOPY WITHOUT FLUORO;  Surgeon: Luciano Cutter, MD;  Location: [facility];  Service: Cardiopulmonary;  Laterality: N/A;   VIDEO BRONCHOSCOPY WITH ENDOBRONCHIAL ULTRASOUND N/A 04/27/2018   Procedure: VIDEO BRONCHOSCOPY WITH ENDOBRONCHIAL ULTRASOUND;  Surgeon: Loreli Slot, MD;  Location: [facility];  Service: Thoracic;  Laterality: N/A;    Family History family history includes Breast cancer in her cousin; Colon cancer in her maternal aunt; Diabetes in her daughter, daughter, and sister.  Social History Social History   Socioeconomic History   Marital status: Single    Spouse name: Not on file   Number of children: 3   Years of education: Not on file   Highest education level: Not on file  Occupational History   Occupation: retired  Tobacco Use   Smoking status: Former    Current packs/day: 0.00    Average packs/day: 0.5 packs/day for 55.0 years (27.5 ttl pk-yrs)    Types: Cigarettes    Start date: 03/19/1963    Quit date: 03/18/2018    Years since quitting: 5.2   Smokeless tobacco: Never   Tobacco comments:    smoked off and n  Vaping Use   Vaping status: Never Used  Substance and Sexual Activity   Alcohol use: No   Drug use: No   Sexual activity: Not Currently  Other Topics Concern   Not on file  Social History Narrative   Patient fully vaccinated   Social Determinants of Health   Financial Resource Strain: Low Risk  (09/30/2020)   Overall Financial Resource Strain (CARDIA)    Difficulty of Paying Living Expenses: Not hard at all  Food Insecurity: No Food Insecurity (09/30/2020)   Hunger Vital Sign    Worried About Running Out of Food in the Last Year: Never true    Ran Out of Food in the Last Year: Never true  Transportation Needs: No Transportation Needs  (09/30/2020)   PRAPARE - Administrator, Civil Service (Medical): No    Lack of Transportation (Non-Medical): No  Physical Activity: Inactive (09/30/2020)   Exercise Vital Sign    Days of Exercise per Week: 0 days    Minutes of Exercise per Session: 0 min  Stress: No Stress Concern Present (09/30/2020)   [employer] of Occupational Health - Occupational Stress Questionnaire    Feeling of Stress : Not at all  Social Connections: Socially Isolated (09/30/2020)   Social Connection and Isolation Panel [NHANES]    Frequency of Communication with Friends and Family: More than three times a week    Frequency of Social Gatherings with Friends and Family: More than three times a week    Attends Religious Services: Never    Database administrator or Organizations: No    Attends Banker Meetings: Never    Marital Status: Never married  Intimate Partner Violence: Not At Risk (09/30/2020)   Humiliation, Afraid, Rape, and Kick questionnaire    Fear of Current or Ex-Partner: No    Emotionally Abused: No    Physically Abused: No    Sexually Abused: No    Lab Results  Component Value Date   HGBA1C 6.3 (A) 05/18/2023   HGBA1C 6.9 (H) 12/22/2022   HGBA1C 6.9 (A) 06/16/2022   Lab Results  Component Value Date   CHOL 136 12/22/2022   Lab Results  Component Value Date   HDL 41.50 12/22/2022   Lab Results  Component Value Date   LDLCALC 74 12/22/2022   Lab Results  Component Value Date   TRIG 101.0 12/22/2022   Lab Results  Component Value Date   CHOLHDL 3 12/22/2022   Lab Results  Component Value Date   CREATININE 1.40 (H) 05/20/2023   Lab Results  Component Value Date   GFR 33.36 (L)  12/22/2022   Lab Results  Component Value Date   MICROALBUR 30.5 (H) 12/22/2022      Component Value Date/Time   NA 136 05/20/2023 2359   K 4.2 05/20/2023 2359   CL 104 05/20/2023 2359   CO2 23 05/20/2023 2359   GLUCOSE 188 (H) 05/20/2023 2359   BUN 25 (H)  05/20/2023 2359   BUN 24 (A) 02/12/2020 0000   CREATININE 1.40 (H) 05/20/2023 2359   CREATININE 1.25 (H) 05/03/2023 1019   CREATININE 0.72 12/10/2011 0919   CALCIUM 9.3 05/20/2023 2359   PROT 8.0 05/03/2023 1019   ALBUMIN 3.8 05/03/2023 1019   ALBUMIN 4.3 12/10/2011 0919   AST 23 05/03/2023 1019   ALT 18 05/03/2023 1019   ALKPHOS 101 05/03/2023 1019   ALKPHOS 88 12/10/2011 0919   BILITOT 0.4 05/03/2023 1019   GFRNONAA 39 (L) 05/20/2023 2359   GFRNONAA 45 (L) 05/03/2023 1019   GFRAA 43 (L) 02/12/2020 0941      Latest Ref Rng & Units 05/20/2023   11:59 PM 05/03/2023   10:19 AM 01/24/2023    9:27 AM  BMP  Glucose 70 - 99 mg/dL 409  811  914   BUN 8 - 23 mg/dL 25  18  29    Creatinine 0.44 - 1.00 mg/dL 7.82  9.56  2.13   Sodium 135 - 145 mmol/L 136  140  139   Potassium 3.5 - 5.1 mmol/L 4.2  4.6  5.1   Chloride 98 - 111 mmol/L 104  110  108   CO2 22 - 32 mmol/L 23  24  25    Calcium 8.9 - 10.3 mg/dL 9.3  9.3  9.8        Component Value Date/Time   WBC 4.7 05/20/2023 2359   RBC 3.98 05/20/2023 2359   HGB 10.8 (L) 05/20/2023 2359   HGB 10.4 (L) 05/03/2023 1023   HGB 8.1 (L) 12/27/2017 1123   HCT 36.3 05/20/2023 2359   HCT 24.4 (L) 12/27/2017 1123   PLT 206 05/20/2023 2359   PLT 214 05/03/2023 1023   PLT 296 12/27/2017 1123   MCV 91.2 05/20/2023 2359   MCV 70 (L) 12/27/2017 1123   MCH 27.1 05/20/2023 2359   MCHC 29.8 (L) 05/20/2023 2359   RDW 16.1 (H) 05/20/2023 2359   RDW 17.0 (H) 12/27/2017 1123   LYMPHSABS 0.5 (L) 05/03/2023 1023   LYMPHSABS 0.8 12/27/2017 1123   MONOABS 0.3 05/03/2023 1023   EOSABS 0.1 05/03/2023 1023   EOSABS 0.1 12/27/2017 1123   BASOSABS 0.0 05/03/2023 1023   BASOSABS 0.0 12/27/2017 1123     Parts of this note may have been dictated using voice recognition software. There may be variances in spelling and vocabulary which are unintentional. Not all errors are proofread. Please notify the There[facility] Parkin if any discrepancies are noted or if the meaning of  any statement is not clear.

## 2023-06-13 ENCOUNTER — Telehealth: Payer: Self-pay | Admitting: Internal Medicine

## 2023-06-13 ENCOUNTER — Other Ambulatory Visit: Payer: Self-pay | Admitting: Internal Medicine

## 2023-06-13 ENCOUNTER — Encounter (HOSPITAL_COMMUNITY): Payer: Self-pay | Admitting: Internal Medicine

## 2023-06-13 DIAGNOSIS — I10 Essential (primary) hypertension: Secondary | ICD-10-CM

## 2023-06-13 DIAGNOSIS — Z1231 Encounter for screening mammogram for malignant neoplasm of breast: Secondary | ICD-10-CM

## 2023-06-13 LAB — CYTOLOGY - NON PAP

## 2023-06-13 NOTE — Telephone Encounter (Signed)
Patient scheduled 06/29/2023 and 08/25/2023 with Dr. Celine Mans. Would like to know which one she should come to. Patient phone number is [phone].

## 2023-06-14 NOTE — Telephone Encounter (Signed)
Pt seen 06/03/23 and told to follow up here in 3 months which is her appt 08/25/23  The appt for 06/29/23 was old appt  I cancelled this and told her to keep ov with [place] 08/25/23  Nothing further needed

## 2023-06-16 ENCOUNTER — Other Ambulatory Visit: Payer: Self-pay

## 2023-06-16 ENCOUNTER — Telehealth: Payer: Self-pay | Admitting: Dietician

## 2023-06-16 DIAGNOSIS — E11649 Type 2 diabetes mellitus with hypoglycemia without coma: Secondary | ICD-10-CM

## 2023-06-16 MED ORDER — BLOOD GLUCOSE TEST STRIPS 333 VI STRP: 1.0000 | ORAL_STRIP | Freq: Four times a day (QID) | 0 refills | Status: DC

## 2023-06-16 MED ORDER — BLOOD GLUCOSE METER KIT: PACK | 0 refills | Status: AC

## 2023-06-16 MED ORDER — ACCU-CHEK SOFTCLIX LANCETS MISC
12 refills | Status: AC
Start: 2023-06-16 — End: ?

## 2023-06-16 NOTE — Telephone Encounter (Signed)
Returned patient call. Appointment made for [facility] and nutrition training with Bonita Quin next week.  Patient to call for further questions.  Oran Rein, RD, LDN, CDCES

## 2023-06-20 ENCOUNTER — Other Ambulatory Visit: Payer: Self-pay

## 2023-06-20 DIAGNOSIS — E11649 Type 2 diabetes mellitus with hypoglycemia without coma: Secondary | ICD-10-CM

## 2023-06-20 DIAGNOSIS — E119 Type 2 diabetes mellitus without complications: Secondary | ICD-10-CM

## 2023-06-20 MED ORDER — BLOOD GLUCOSE TEST STRIPS 333 VI STRP: 1.0000 | ORAL_STRIP | Freq: Four times a day (QID) | 0 refills | Status: AC

## 2023-06-20 MED ORDER — BLOOD GLUCOSE MONITOR KIT: PACK | 0 refills | Status: DC

## 2023-06-22 ENCOUNTER — Other Ambulatory Visit: Payer: Self-pay

## 2023-06-22 ENCOUNTER — Encounter: Payer: 59 | Attending: "Endocrinology | Admitting: Nutrition

## 2023-06-22 VITALS — Wt 193.6 lb

## 2023-06-22 DIAGNOSIS — E118 Type 2 diabetes mellitus with unspecified complications: Secondary | ICD-10-CM

## 2023-06-22 DIAGNOSIS — Z6837 Body mass index (BMI) 37.0-37.9, adult: Secondary | ICD-10-CM | POA: Insufficient documentation

## 2023-06-22 DIAGNOSIS — Z713 Dietary counseling and surveillance: Secondary | ICD-10-CM | POA: Insufficient documentation

## 2023-06-22 DIAGNOSIS — E119 Type 2 diabetes mellitus without complications: Secondary | ICD-10-CM

## 2023-06-22 DIAGNOSIS — E11649 Type 2 diabetes mellitus with hypoglycemia without coma: Secondary | ICD-10-CM | POA: Diagnosis not present

## 2023-06-22 MED ORDER — BLOOD GLUCOSE MONITOR KIT: PACK | 0 refills | Status: AC

## 2023-06-27 NOTE — Patient Instructions (Addendum)
Change sensor every 10 days. Call Dexcom help line if questions. Make sure all meals have protein, some form of carbohydrate and a little fat. Stop all sweet drinks including sweet tea.

## 2023-06-27 NOTE — Progress Notes (Signed)
Patient was trained on the use of the Dexcom G7 with a receiver.  Her receiver was set with date/time and alerts for lows at 70mg  and highs at 250mg .  She was trained on how to insert, code and use the Dexcom G7.  She started a sensor on her right arm and linked this to the receiver.  She was encourage to read the directions and and call Dexcom help line if problems or questions.  We discussed the difference between sensor readings and blood sugar readings, and when it is necessary to do a blood glucose reading.  She reported good understanding of this and had no final questions.   Patient requested general dietary information, saying she has no appetite, and is not eating "much".  Taking 30u of  Lantus q AM.   Current dietary schedule: 7AM: up,  8-9AM: sausage, 1-2 pieces, 1 toast with butter, or toast with pimento cheese spread.  Coffee to drink. Ocassional cutie tangerine. 12:30PM: chicken piece or bite of meat from supper. Water to drink, or occasional sweet soda   6:30: Pimento Cheese on 6 crackers, with green peas or some other green vegetable-usually non starchy Denies eating after supper or during the night. Discussion: Stop all sweet drinks, fruit juices and cold cereal and milk,  Switch to unsweet tea with splenda, or diet drinks.  Samples given of Spenda to use in tea and list of flavored waters that have no calories 2.   3 basic food groups, what foods are in each group, and how much of each one she can eat.  3.  Need to limit fries foods to 2X/wk, and not in same meal.  4.  Need for exercise to help with insulin sensitively.  Try to work up to 30-40 minutes 4-5 days/wk. 5.  Tried to use her meal plan to add some carbohydrate to her lunch meal, with what she likes and discussed other options for breakfast choice and supper choices that are easy for her to make that will include all 3 food groups.

## 2023-06-29 ENCOUNTER — Ambulatory Visit: Payer: 59 | Admitting: Internal Medicine

## 2023-07-06 ENCOUNTER — Telehealth: Payer: Self-pay | Admitting: Nutrition

## 2023-07-06 NOTE — Telephone Encounter (Signed)
Patient is requesting that you call her a prescription for the BD pen needles that are 4mm long, to her pharmacy.

## 2023-07-07 ENCOUNTER — Other Ambulatory Visit: Payer: Self-pay

## 2023-07-07 ENCOUNTER — Ambulatory Visit
Admission: RE | Admit: 2023-07-07 | Discharge: 2023-07-07 | Disposition: A | Discharge status: Home / Self Care | Payer: 59 | Source: Ambulatory Visit | Attending: Internal Medicine | Admitting: Internal Medicine

## 2023-07-07 DIAGNOSIS — E1165 Type 2 diabetes mellitus with hyperglycemia: Secondary | ICD-10-CM

## 2023-07-07 DIAGNOSIS — Z1231 Encounter for screening mammogram for malignant neoplasm of breast: Secondary | ICD-10-CM

## 2023-07-07 MED ORDER — INSULIN PEN NEEDLE 32G X 4 MM MISC: 1.0000 | Freq: Every morning | 1 refills | Status: AC

## 2023-07-07 NOTE — Telephone Encounter (Signed)
 Pen needles have been sent to her pharmacy

## 2023-07-08 LAB — HM MAMMOGRAPHY

## 2023-07-11 ENCOUNTER — Encounter: Payer: Self-pay | Admitting: Internal Medicine

## 2023-08-02 ENCOUNTER — Inpatient Hospital Stay (HOSPITAL_COMMUNITY)
Admission: EM | Admit: 2023-08-02 | Discharge: 2023-08-05 | DRG: 641 | Disposition: A | Discharge status: Home / Self Care | Payer: 59 | Attending: Family Medicine | Admitting: Family Medicine

## 2023-08-02 ENCOUNTER — Emergency Department (HOSPITAL_COMMUNITY): Payer: 59

## 2023-08-02 ENCOUNTER — Encounter (HOSPITAL_COMMUNITY): Payer: Self-pay | Admitting: Emergency Medicine

## 2023-08-02 ENCOUNTER — Other Ambulatory Visit: Payer: Self-pay

## 2023-08-02 ENCOUNTER — Inpatient Hospital Stay (HOSPITAL_COMMUNITY): Payer: 59

## 2023-08-02 ENCOUNTER — Other Ambulatory Visit: Payer: Self-pay | Admitting: Internal Medicine

## 2023-08-02 DIAGNOSIS — E876 Hypokalemia: Secondary | ICD-10-CM | POA: Diagnosis present

## 2023-08-02 DIAGNOSIS — E1122 Type 2 diabetes mellitus with diabetic chronic kidney disease: Secondary | ICD-10-CM | POA: Diagnosis present

## 2023-08-02 DIAGNOSIS — Z79899 Other long term (current) drug therapy: Secondary | ICD-10-CM

## 2023-08-02 DIAGNOSIS — I442 Atrioventricular block, complete: Secondary | ICD-10-CM | POA: Diagnosis present

## 2023-08-02 DIAGNOSIS — Z9049 Acquired absence of other specified parts of digestive tract: Secondary | ICD-10-CM

## 2023-08-02 DIAGNOSIS — R6889 Other general symptoms and signs: Secondary | ICD-10-CM | POA: Diagnosis not present

## 2023-08-02 DIAGNOSIS — Z7951 Long term (current) use of inhaled steroids: Secondary | ICD-10-CM

## 2023-08-02 DIAGNOSIS — Z87891 Personal history of nicotine dependence: Secondary | ICD-10-CM

## 2023-08-02 DIAGNOSIS — K219 Gastro-esophageal reflux disease without esophagitis: Secondary | ICD-10-CM | POA: Diagnosis present

## 2023-08-02 DIAGNOSIS — Z79890 Hormone replacement therapy (postmenopausal): Secondary | ICD-10-CM | POA: Diagnosis not present

## 2023-08-02 DIAGNOSIS — T465X5A Adverse effect of other antihypertensive drugs, initial encounter: Secondary | ICD-10-CM | POA: Diagnosis present

## 2023-08-02 DIAGNOSIS — Z888 Allergy status to other drugs, medicaments and biological substances status: Secondary | ICD-10-CM

## 2023-08-02 DIAGNOSIS — N183 Chronic kidney disease, stage 3 unspecified: Secondary | ICD-10-CM | POA: Diagnosis present

## 2023-08-02 DIAGNOSIS — E785 Hyperlipidemia, unspecified: Secondary | ICD-10-CM | POA: Diagnosis present

## 2023-08-02 DIAGNOSIS — D631 Anemia in chronic kidney disease: Secondary | ICD-10-CM | POA: Diagnosis present

## 2023-08-02 DIAGNOSIS — I459 Conduction disorder, unspecified: Principal | ICD-10-CM

## 2023-08-02 DIAGNOSIS — Z85118 Personal history of other malignant neoplasm of bronchus and lung: Secondary | ICD-10-CM | POA: Diagnosis not present

## 2023-08-02 DIAGNOSIS — I499 Cardiac arrhythmia, unspecified: Secondary | ICD-10-CM | POA: Diagnosis not present

## 2023-08-02 DIAGNOSIS — Z886 Allergy status to analgesic agent status: Secondary | ICD-10-CM | POA: Diagnosis not present

## 2023-08-02 DIAGNOSIS — R918 Other nonspecific abnormal finding of lung field: Secondary | ICD-10-CM | POA: Diagnosis not present

## 2023-08-02 DIAGNOSIS — D509 Iron deficiency anemia, unspecified: Secondary | ICD-10-CM | POA: Diagnosis present

## 2023-08-02 DIAGNOSIS — Z9221 Personal history of antineoplastic chemotherapy: Secondary | ICD-10-CM

## 2023-08-02 DIAGNOSIS — R7401 Elevation of levels of liver transaminase levels: Secondary | ICD-10-CM | POA: Diagnosis present

## 2023-08-02 DIAGNOSIS — Z743 Need for continuous supervision: Secondary | ICD-10-CM | POA: Diagnosis not present

## 2023-08-02 DIAGNOSIS — J90 Pleural effusion, not elsewhere classified: Secondary | ICD-10-CM | POA: Diagnosis present

## 2023-08-02 DIAGNOSIS — Z794 Long term (current) use of insulin: Secondary | ICD-10-CM | POA: Diagnosis not present

## 2023-08-02 DIAGNOSIS — I129 Hypertensive chronic kidney disease with stage 1 through stage 4 chronic kidney disease, or unspecified chronic kidney disease: Secondary | ICD-10-CM | POA: Diagnosis present

## 2023-08-02 DIAGNOSIS — Z833 Family history of diabetes mellitus: Secondary | ICD-10-CM

## 2023-08-02 DIAGNOSIS — E875 Hyperkalemia: Secondary | ICD-10-CM | POA: Diagnosis present

## 2023-08-02 DIAGNOSIS — R7989 Other specified abnormal findings of blood chemistry: Secondary | ICD-10-CM | POA: Diagnosis not present

## 2023-08-02 DIAGNOSIS — I10 Essential (primary) hypertension: Secondary | ICD-10-CM

## 2023-08-02 DIAGNOSIS — R001 Bradycardia, unspecified: Secondary | ICD-10-CM | POA: Diagnosis present

## 2023-08-02 DIAGNOSIS — E039 Hypothyroidism, unspecified: Secondary | ICD-10-CM | POA: Diagnosis present

## 2023-08-02 DIAGNOSIS — R739 Hyperglycemia, unspecified: Secondary | ICD-10-CM | POA: Diagnosis not present

## 2023-08-02 DIAGNOSIS — E872 Acidosis, unspecified: Secondary | ICD-10-CM | POA: Diagnosis present

## 2023-08-02 DIAGNOSIS — N179 Acute kidney failure, unspecified: Secondary | ICD-10-CM | POA: Diagnosis present

## 2023-08-02 DIAGNOSIS — Z860100 Personal history of colon polyps, unspecified: Secondary | ICD-10-CM

## 2023-08-02 LAB — COMPREHENSIVE METABOLIC PANEL
ALT: 251 U/L — ABNORMAL HIGH (ref 0–44)
AST: 406 U/L — ABNORMAL HIGH (ref 15–41)
Albumin: 3.5 g/dL (ref 3.5–5.0)
Alkaline Phosphatase: 142 U/L — ABNORMAL HIGH (ref 38–126)
Anion gap: 6 (ref 5–15)
BUN: 50 mg/dL — ABNORMAL HIGH (ref 8–23)
CO2: 14 mmol/L — ABNORMAL LOW (ref 22–32)
Calcium: 8.7 mg/dL — ABNORMAL LOW (ref 8.9–10.3)
Chloride: 114 mmol/L — ABNORMAL HIGH (ref 98–111)
Creatinine, Ser: 1.8 mg/dL — ABNORMAL HIGH (ref 0.44–1.00)
GFR, Estimated: 29 mL/min — ABNORMAL LOW (ref >60)
Glucose, Bld: 208 mg/dL — ABNORMAL HIGH (ref 70–99)
Potassium: >7.5 mmol/L (ref 3.5–5.1)
Sodium: 134 mmol/L — ABNORMAL LOW (ref 135–145)
Total Bilirubin: 0.5 mg/dL (ref <1.2)
Total Protein: 7.7 g/dL (ref 6.5–8.1)

## 2023-08-02 LAB — GLUCOSE, RANDOM: Glucose, Bld: 190 mg/dL — ABNORMAL HIGH (ref 70–99)

## 2023-08-02 LAB — GLUCOSE, CAPILLARY
Glucose-Capillary: 154 mg/dL — ABNORMAL HIGH (ref 70–99)
Glucose-Capillary: 148 mg/dL — ABNORMAL HIGH (ref 70–99)

## 2023-08-02 LAB — CBG MONITORING, ED
Glucose-Capillary: 184 mg/dL — ABNORMAL HIGH (ref 70–99)
Glucose-Capillary: 206 mg/dL — ABNORMAL HIGH (ref 70–99)
Glucose-Capillary: 167 mg/dL — ABNORMAL HIGH (ref 70–99)

## 2023-08-02 LAB — MRSA NEXT GEN BY PCR, NASAL: MRSA by PCR Next Gen: NOT DETECTED

## 2023-08-02 LAB — CBC WITH DIFFERENTIAL/PLATELET
Abs Immature Granulocytes: 0.03 10*3/uL (ref 0.00–0.07)
Basophils Absolute: 0 10*3/uL (ref 0.0–0.1)
Basophils Relative: 0 %
Eosinophils Absolute: 0 10*3/uL (ref 0.0–0.5)
Eosinophils Relative: 0 %
HCT: 37.9 % (ref 36.0–46.0)
Hemoglobin: 11 g/dL — ABNORMAL LOW (ref 12.0–15.0)
Immature Granulocytes: 1 %
Lymphocytes Relative: 7 %
Lymphs Abs: 0.5 10*3/uL — ABNORMAL LOW (ref 0.7–4.0)
MCH: 26.3 pg (ref 26.0–34.0)
MCHC: 29 g/dL — ABNORMAL LOW (ref 30.0–36.0)
MCV: 90.7 fL (ref 80.0–100.0)
Monocytes Absolute: 0.3 10*3/uL (ref 0.1–1.0)
Monocytes Relative: 4 %
Neutro Abs: 5.6 10*3/uL (ref 1.7–7.7)
Neutrophils Relative %: 88 %
Platelets: 187 10*3/uL (ref 150–400)
RBC: 4.18 MIL/uL (ref 3.87–5.11)
RDW: 15.7 % — ABNORMAL HIGH (ref 11.5–15.5)
WBC: 6.4 10*3/uL (ref 4.0–10.5)
nRBC: 0 % (ref 0.0–0.2)

## 2023-08-02 LAB — POTASSIUM: Potassium: 5.9 mmol/L — ABNORMAL HIGH (ref 3.5–5.1)

## 2023-08-02 LAB — TSH: TSH: 4.262 u[IU]/mL (ref 0.350–4.500)

## 2023-08-02 LAB — I-STAT CG4 LACTIC ACID, ED
Lactic Acid, Venous: 1 mmol/L (ref 0.5–1.9)
Lactic Acid, Venous: 1.4 mmol/L (ref 0.5–1.9)

## 2023-08-02 MED ORDER — ONDANSETRON HCL 4 MG/2ML IJ SOLN
4.0000 mg | Freq: Four times a day (QID) | INTRAMUSCULAR | Status: DC | PRN
Start: 2023-08-02 — End: 2023-08-05
  Administered 2023-08-05: 4 mg via INTRAVENOUS
  Filled 2023-08-02: qty 2

## 2023-08-02 MED ORDER — CALCIUM GLUCONATE 10 % IV SOLN
1.0000 g | Freq: Once | INTRAVENOUS | Status: DC
Start: 2023-08-02 — End: 2023-08-02

## 2023-08-02 MED ORDER — MECLIZINE HCL 25 MG PO TABS: 25.0000 mg | ORAL_TABLET | Freq: Three times a day (TID) | ORAL | Status: DC | PRN

## 2023-08-02 MED ORDER — INSULIN ASPART 100 UNIT/ML IV SOLN
5.0000 [IU] | Freq: Once | INTRAVENOUS | Status: AC
  Administered 2023-08-02: 5 [IU] via INTRAVENOUS

## 2023-08-02 MED ORDER — SODIUM BICARBONATE 8.4 % IV SOLN
50.0000 meq | Freq: Once | INTRAVENOUS | Status: AC
Start: 2023-08-02 — End: 2023-08-02
  Administered 2023-08-02: 50 meq via INTRAVENOUS
  Filled 2023-08-02: qty 50

## 2023-08-02 MED ORDER — ADULT MULTIVITAMIN W/MINERALS CH
1.0000 | ORAL_TABLET | Freq: Every day | ORAL | Status: DC
  Administered 2023-08-02 – 2023-08-05 (×4): 1 via ORAL
  Filled 2023-08-02 (×4): qty 1

## 2023-08-02 MED ORDER — SODIUM ZIRCONIUM CYCLOSILICATE 10 G PO PACK
10.0000 g | PACK | Freq: Once | ORAL | Status: AC
  Administered 2023-08-02: 10 g via ORAL
  Filled 2023-08-02: qty 1

## 2023-08-02 MED ORDER — ATROPINE SULFATE 1 MG/10ML IJ SOSY: 1.0000 mg | PREFILLED_SYRINGE | Freq: Once | INTRAMUSCULAR | Status: DC

## 2023-08-02 MED ORDER — LEVOTHYROXINE SODIUM 75 MCG PO TABS
75.0000 ug | ORAL_TABLET | Freq: Every day | ORAL | Status: DC
  Administered 2023-08-03 – 2023-08-05 (×3): 75 ug via ORAL
  Filled 2023-08-02: qty 3
  Filled 2023-08-02 (×2): qty 1

## 2023-08-02 MED ORDER — DEXTROSE 50 % IV SOLN
1.0000 | Freq: Once | INTRAVENOUS | Status: AC
  Administered 2023-08-02: 50 mL via INTRAVENOUS
  Filled 2023-08-02: qty 50

## 2023-08-02 MED ORDER — CHLORHEXIDINE GLUCONATE CLOTH 2 % EX PADS
6.0000 | MEDICATED_PAD | Freq: Every day | CUTANEOUS | Status: DC
  Administered 2023-08-02 – 2023-08-05 (×4): 6 via TOPICAL

## 2023-08-02 MED ORDER — DOCUSATE SODIUM 100 MG PO CAPS
100.0000 mg | ORAL_CAPSULE | Freq: Two times a day (BID) | ORAL | Status: DC | PRN
  Administered 2023-08-05: 100 mg via ORAL
  Filled 2023-08-02: qty 1

## 2023-08-02 MED ORDER — FLUTICASONE FUROATE-VILANTEROL 200-25 MCG/ACT IN AEPB
1.0000 | INHALATION_SPRAY | Freq: Every day | RESPIRATORY_TRACT | Status: DC
  Administered 2023-08-03: 1 via RESPIRATORY_TRACT
  Filled 2023-08-02 (×2): qty 28

## 2023-08-02 MED ORDER — SODIUM CHLORIDE 0.9 % IV BOLUS
500.0000 mL | Freq: Once | INTRAVENOUS | Status: AC
  Administered 2023-08-02: 500 mL via INTRAVENOUS

## 2023-08-02 MED ORDER — SODIUM CHLORIDE 0.9 % IV SOLN: INTRAVENOUS | Status: DC

## 2023-08-02 MED ORDER — HEPARIN SODIUM (PORCINE) 5000 UNIT/ML IJ SOLN
5000.0000 [IU] | Freq: Three times a day (TID) | INTRAMUSCULAR | Status: DC
  Administered 2023-08-03 – 2023-08-05 (×5): 5000 [IU] via SUBCUTANEOUS
  Filled 2023-08-02 (×5): qty 1

## 2023-08-02 MED ORDER — DIPHENHYDRAMINE HCL 2 % EX CREA: 1.0000 | TOPICAL_CREAM | CUTANEOUS | Status: DC | PRN

## 2023-08-02 MED ORDER — INSULIN ASPART 100 UNIT/ML IJ SOLN
0.0000 [IU] | INTRAMUSCULAR | Status: DC
  Administered 2023-08-02: 1 [IU] via SUBCUTANEOUS
  Administered 2023-08-02: 2 [IU] via SUBCUTANEOUS
  Administered 2023-08-03 (×2): 1 [IU] via SUBCUTANEOUS

## 2023-08-02 MED ORDER — SODIUM BICARBONATE 8.4 % IV SOLN
Freq: Once | INTRAVENOUS | Status: AC
  Filled 2023-08-02: qty 1000

## 2023-08-02 MED ORDER — HYDRALAZINE HCL 20 MG/ML IJ SOLN
5.0000 mg | INTRAMUSCULAR | Status: DC | PRN
  Administered 2023-08-04: 5 mg via INTRAVENOUS
  Administered 2023-08-05: 10 mg via INTRAVENOUS
  Filled 2023-08-02 (×2): qty 1

## 2023-08-02 MED ORDER — CALCIUM GLUCONATE-NACL 1-0.675 GM/50ML-% IV SOLN
1.0000 g | Freq: Once | INTRAVENOUS | Status: AC
  Administered 2023-08-02: 1000 mg via INTRAVENOUS
  Filled 2023-08-02: qty 50

## 2023-08-02 MED ORDER — POLYETHYLENE GLYCOL 3350 17 G PO PACK
17.0000 g | PACK | Freq: Every day | ORAL | Status: DC | PRN
Start: 2023-08-02 — End: 2023-08-05

## 2023-08-02 NOTE — ED Provider Notes (Addendum)
[place] [facility] AT [facility] Provider Note   CSN: [id number] Arrival date & time: 08/02/23  1624     History  Chief Complaint  Patient presents with   Weakness    Jenna [name] is a 77 y.o. female.  Patient has had some loose bowel movements a little bit of diarrhea all week.  No red blood in the toilet water.  Maybe a little red blood with wiping.  Patient has a history of diabetes and hypertension.  For hypertension she is on labetalol and she is also on Cozaar.  Patient states she felt extra bad today.  Denies any chest pain no nausea no vomiting patient EMS noted that the heart rates were in the 30s.  1 heart rate gets extra slow patient gets a little sluggish.  EKG done here shows a junctional rhythm with a heart rate of 41 no P waves.  Cardiac monitor shows occasional P waves when the heart rate gets up a little bit higher.       Home Medications Prior to Admission medications   Medication Sig Start Date End Date Taking? Authorizing Provider  diphenhydrAMINE (BENADRYL) 2 % cream Apply 1 application topically as needed for itching.   Yes [provider]  FARXIGA 10 MG TABS tablet Take 1 tablet (10 mg total) by mouth daily before breakfast. Follow-up appt due in Nov must see provider for future refills 06/09/23  Yes Motwani, Komal, MD  insulin glargine (LANTUS SOLOSTAR) 100 UNIT/ML Solostar Pen Inject 34 Units into the skin daily. And 31G, 5mm pen needles 1/day Patient taking differently: Inject 30 Units into the skin daily. And 31G, 5mm pen needles 1/day 12/22/22  Yes [name], MD  labetalol (NORMODYNE) 100 MG tablet Take 0.5 tablets (50 mg total) by mouth 2 (two) times daily. 02/11/23  Yes [name], MD  levothyroxine (SYNTHROID) 75 MCG tablet Take 1 tablet by mouth once daily 01/17/23  Yes [name], MD  losartan (COZAAR) 50 MG tablet Take 1 tablet by mouth once daily 06/13/23  Yes [name], MD  meclizine (ANTIVERT) 25 MG tablet Take 1 tablet (25 mg total) by mouth 3 (three) times daily as needed for dizziness. 05/21/23  Yes Dione Booze, MD  Multiple Vitamin (MULTIVITAMIN) capsule Take 1 capsule by mouth daily.   Yes [provider]  pravastatin (PRAVACHOL) 20 MG tablet Take 1 tablet (20 mg total) by mouth daily. 01/17/23  Yes [name], MD  Accu-Chek Softclix Lancets lancets Use as instructed 06/16/23   Altamese [place], MD  albuterol (VENTOLIN HFA) 108 (90 Base) MCG/ACT inhaler Inhale 1-2 puffs into the lungs every 6 (six) hours as needed for wheezing or shortness of breath. Patient not taking: Reported on 08/02/2023 01/24/23   Heilingoetter, Cassandra L, PA-C  blood glucose meter kit and supplies KIT Use up to four times daily as directed/accu check guide kit 06/22/23   Altamese [place], MD  blood glucose meter kit and supplies Dispense based on patient and insurance preference. Use up to four times daily as directed. (FOR ICD-10 E10.9, E11.9). 06/16/23   Motwani, Carin Hock, MD  Continuous Glucose Sensor (DEXCOM G7 SENSOR) MISC 1 Device by Does not apply route continuous. Patient not taking: Reported on 08/02/2023 01/27/23   Altamese [place], MD  Ferrous Sulfate (IRON) 325 (65 Fe) MG TABS Take 1 tablet (325 mg total) by mouth 2 (two) times daily with a meal. Patient not taking: Reported on 08/02/2023 12/25/20  Cirigliano, Mary K, DO  fluticasone furoate-vilanterol (BREO ELLIPTA) 200-25 MCG/ACT AEPB Inhale 1 puff into the lungs daily. Patient not taking: Reported on 08/02/2023 09/14/21   [name], MD  Glucose Blood (BLOOD GLUCOSE TEST STRIPS 333) STRP 1 strip by In Vitro route 4 (four) times daily. 06/20/23   Motwani, Carin Hock, MD  Insulin Pen Needle 32G X 4 MM MISC Inject 1 Needle into the skin every morning. 07/07/23   Altamese [place], MD      Allergies    Paclitaxel, Aspirin, Esomeprazole magnesium, and Ace inhibitors    Review of Systems   Review of  Systems  Constitutional:  Positive for fatigue. Negative for chills and fever.  HENT:  Negative for ear pain and sore throat.   Eyes:  Negative for pain and visual disturbance.  Respiratory:  Negative for cough and shortness of breath.   Cardiovascular:  Negative for chest pain and palpitations.  Gastrointestinal:  Positive for diarrhea. Negative for abdominal pain, nausea and vomiting.  Genitourinary:  Negative for dysuria and hematuria.  Musculoskeletal:  Negative for arthralgias and back pain.  Skin:  Negative for color change and rash.  Neurological:  Negative for seizures and syncope.  All other systems reviewed and are negative.   Physical Exam Updated Vital Signs BP 117/61   Pulse 88   Temp (!) 97.5 F (36.4 C)   Resp (!) 29   SpO2 100%  Physical Exam Vitals and nursing note reviewed.  Constitutional:      General: She is not in acute distress.    Appearance: Normal appearance. She is well-developed. She is ill-appearing.  HENT:     Head: Normocephalic and atraumatic.  Eyes:     Conjunctiva/sclera: Conjunctivae normal.     Pupils: Pupils are equal, round, and reactive to light.  Cardiovascular:     Rate and Rhythm: Bradycardia present. Rhythm irregular.     Heart sounds: No murmur heard. Pulmonary:     Effort: Pulmonary effort is normal. No respiratory distress.     Breath sounds: Normal breath sounds. No wheezing, rhonchi or rales.  Abdominal:     General: There is no distension.     Palpations: Abdomen is soft.     Tenderness: There is no abdominal tenderness. There is no guarding.  Musculoskeletal:        General: No swelling.     Cervical back: Neck supple.  Skin:    General: Skin is warm and dry.     Capillary Refill: Capillary refill takes less than 2 seconds.  Neurological:     General: No focal deficit present.     Mental Status: She is alert and oriented to person, place, and time.  Psychiatric:        Mood and Affect: Mood normal.     ED  Results / Procedures / Treatments   Labs (all labs ordered are listed, but only abnormal results are displayed) Labs Reviewed  CBC WITH DIFFERENTIAL/PLATELET - Abnormal; Notable for the following components:      Result Value   Hemoglobin 11.0 (*)    MCHC 29.0 (*)    RDW 15.7 (*)    Lymphs Abs 0.5 (*)    All other components within normal limits  COMPREHENSIVE METABOLIC PANEL - Abnormal; Notable for the following components:   Sodium 134 (*)    Potassium >7.5 (*)    Chloride 114 (*)    CO2 14 (*)    Glucose, Bld 208 (*)    BUN  50 (*)    Creatinine, Ser 1.80 (*)    Calcium 8.7 (*)    AST 406 (*)    ALT 251 (*)    Alkaline Phosphatase 142 (*)    GFR, Estimated 29 (*)    All other components within normal limits  CBG MONITORING, ED - Abnormal; Notable for the following components:   Glucose-Capillary 184 (*)    All other components within normal limits  C DIFFICILE QUICK SCREEN W PCR REFLEX    GASTROINTESTINAL PANEL BY PCR, STOOL (REPLACES STOOL CULTURE)  GLUCOSE, RANDOM  I-STAT CHEM 8, ED  I-STAT CG4 LACTIC ACID, ED  I-STAT CG4 LACTIC ACID, ED    EKG None  Radiology DG Chest Port 1 View Result Date: 08/02/2023 CLINICAL DATA:  Bradycardia, concern for heart block EXAM: PORTABLE CHEST 1 VIEW COMPARISON:  06/08/2023 FINDINGS: Similar right perihilar opacity. Increased layering right pleural effusion and associated airspace opacities compared to 06/08/2023. The left lung is clear. No pneumothorax. Stable cardiomediastinal silhouette. Defibrillator pads. IMPRESSION: Increased layering right pleural effusion and associated airspace opacities compared to 06/08/2023. Pneumonia is difficult to exclude. Similar right perihilar opacity. Electronically Signed   By: Minerva Fester M.D.   On: 08/02/2023 18:59    Procedures Procedures    Medications Ordered in ED Medications  0.9 %  sodium chloride infusion ( Intravenous New Bag/Given 08/02/23 1805)  sodium zirconium cyclosilicate  (LOKELMA) packet 10 g (0 g Oral Hold 08/02/23 1912)  atropine 1 MG/10ML injection 1 mg (0 mg Intravenous Hold 08/02/23 1912)  sodium bicarbonate 150 mEq in dextrose 5 % 1,150 mL infusion (has no administration in time range)  sodium chloride 0.9 % bolus 500 mL (0 mLs Intravenous Stopped 08/02/23 1806)  insulin aspart (novoLOG) injection 5 Units (5 Units Intravenous Given 08/02/23 1830)    And  dextrose 50 % solution 50 mL (50 mLs Intravenous Given 08/02/23 1830)  calcium gluconate 1 g/ 50 mL sodium chloride IVPB (0 mg Intravenous Stopped 08/02/23 1848)  sodium bicarbonate injection 50 mEq (50 mEq Intravenous Given 08/02/23 1902)    ED Course/ Medical Decision Making/ A&P                                 Medical Decision Making Amount and/or Complexity of Data Reviewed Labs: ordered. Radiology: ordered.  Risk OTC drugs. Prescription drug management. Decision regarding hospitalization.   CRITICAL CARE Performed by: Vanetta Mulders Total critical care time: 60 minutes Critical care time was exclusive of separately billable procedures and treating other patients. Critical care was necessary to treat or prevent imminent or life-threatening deterioration. Critical care was time spent personally by me on the following activities: development of treatment plan with patient and/or surrogate as well as nursing, discussions with consultants, evaluation of patient's response to treatment, examination of patient, obtaining history from patient or surrogate, ordering and performing treatments and interventions, ordering and review of laboratory studies, ordering and review of radiographic studies, pulse oximetry and re-evaluation of patient's condition.   Patient's got cardiac pads placed and pacing available if needed.  Patient's EKG consistent with heart block.  Could be secondary to her medicines perhaps some dehydration perhaps even some acute kidney injury.  Labs are still pending.  Will  discuss with cardiology on-call.  Patient has chest x-ray and labs pending.  Will give a little bit of fluids.  Patient blood pressure systolic is in the 100s.  Abdomen seems to  be nonacute.  Will also order C. difficile and stool panel.  Initially we felt that patient may be in heart block.  But then her potassium came back markedly elevated at greater than 7.5.  But the creatinine was not significantly up it was 1.8 and GFR 29 so probably something contributing to that her beta-blocker still could be contributing to some of the symptoms.  Patient based on this was given calcium gluconate with some improvement in the rhythm.  QRS is never been terribly wide.  Then patient also received insulin and dextrose.  I did not seem to make much difference.  Noted that on her CO2 was 14 so gave her an amp of bicarb and heart rate got much better with heart rate around 77.  Patient's been a little groggy but she will wake up and she will answer questions.  Based on the fact that she responded to bicarb we will start a bicarb drip.  Have repaged cardiology because they never did see her.  But I think it is not an intrinsic conduction delay heart block.  But think it may be due to the hyperkalemia.  Will discuss with the critical care specialist.  To see if they will do the admission or if it needs to be a hospitalist admission.  Patient CBC without acute findings lactic acid was not elevated.  Patient's blood sugar right pleural effusion and associated airspace opacities compared to pneumonia is difficult to exclude.  But patient is too unstable to send over for CT scan at this time.   Final Clinical Impression(s) / ED Diagnoses Final diagnoses:  Heart block  Hyperkalemia    Rx / DC Orders ED Discharge Orders     None         Vanetta Mulders, MD 08/02/23 1652    Vanetta Mulders, MD 08/02/23 [phone]

## 2023-08-02 NOTE — Progress Notes (Signed)
eLink Physician-Brief Progress Note Patient Name: Jenna [name] DOB: 11-10-1945 MRN: [id number]   Date of Service  08/02/2023  HPI/Events of Note  Patient admitted with symptomatic bradycardia secondary to severe hyperkalemia, uncontrolled hypertension, suspected gastroenteritis, and acute kidney injury.  eICU Interventions  New Patient Evaluation.        Migdalia Dk 08/02/2023, 9:41 PM

## 2023-08-02 NOTE — ED Notes (Signed)
BGL 206

## 2023-08-02 NOTE — ED Notes (Signed)
Zoll placed on PT

## 2023-08-02 NOTE — ED Triage Notes (Signed)
Pt arrived via [facility] from home for sudden onset weakness, nausea/vomiting, diarrhea earlier today. EMS report intermittent periods of bradycardia into the 30s from 80s afib, blood pressure maintains map > 65. No known cardiac history. Pt awake and alert, but sluggish with responses especially during bradycardic events.   PTA EMS Vitals  HR 30-80 Afib BP 100/50 SPO2  RR 20 CBG 260

## 2023-08-02 NOTE — ED Notes (Signed)
ED TO INPATIENT HANDOFF REPORT  ED Nurse Name and Phone #: Trinna Post, RN [phone]  S Name/Age/Gender Jenna [name] 77 y.o. female Room/Bed: 011C/011C  Code Status   Code Status: Full Code  Home/SNF/Other Home Patient oriented to: self, place, time, and situation Is this baseline? Yes   Triage Complete: Triage complete  Chief Complaint Hyperkalemia [E87.5]  Triage Note Pt arrived via [facility] from home for sudden onset weakness, nausea/vomiting, diarrhea earlier today. EMS report intermittent periods of bradycardia into the 30s from 80s afib, blood pressure maintains map > 65. No known cardiac history. Pt awake and alert, but sluggish with responses especially during bradycardic events.   PTA EMS Vitals  HR 30-80 Afib BP 100/50 SPO2  RR 20 CBG 260    Allergies Allergies  Allergen Reactions   Paclitaxel Other (See Comments)    Unresponsiveness shortly after Taxol inf started 06/12/18.   Aspirin Other (See Comments)    Stomach bleeding    Esomeprazole Magnesium     UNSPECIFIED REACTION    Ace Inhibitors Other (See Comments)    Dizziness, drunk like    Level of Care/Admitting Diagnosis ED Disposition     ED Disposition  Admit   Condition  --   Comment  Hospital Area: [facility] [[id number]]  Level of Care: ICU [6]  May admit patient to [facility] or [facility] if equivalent level of care is available:: No  Covid Evaluation: Asymptomatic - no recent exposure (last 10 days) testing not required  Diagnosis: Hyperkalemia [629528]  Admitting Physician: Lorin Glass [[id number]]  Attending Physician: Lorin Glass [[id number]]  Certification:: I certify this patient will need inpatient services for at least 2 midnights  Expected Medical Readiness: 08/05/2023          B Medical/Surgery History Past Medical History:  Diagnosis Date   Blood transfusion without reported diagnosis    Cataract    DM (diabetes mellitus) (HCC)    Dyspnea    GERD  (gastroesophageal reflux disease)    Hyperlipidemia    Hypertension    Iron deficiency anemia    NSCL ca dx'd 03/2018   Pneumonia    Thyroid disease    Past Surgical History:  Procedure Laterality Date   BIOPSY  12/09/2022   Procedure: BIOPSY;  Surgeon: Benancio Deeds, MD;  Location: [facility];  Service: Gastroenterology;;   BRONCHIAL BIOPSY  04/05/2023   Procedure: BRONCHIAL BIOPSIES;  Surgeon: Luciano Cutter, MD;  Location: [facility];  Service: Cardiopulmonary;;   BRONCHIAL BRUSHINGS  04/05/2023   Procedure: BRONCHIAL BRUSHINGS;  Surgeon: Luciano Cutter, MD;  Location: [facility];  Service: Cardiopulmonary;;   BRONCHIAL WASHINGS  04/05/2023   Procedure: BRONCHIAL WASHINGS;  Surgeon: Luciano Cutter, MD;  Location: [facility];  Service: Cardiopulmonary;;   CATARACT EXTRACTION Right    CHOLECYSTECTOMY     COLONOSCOPY  10/24/2009   normal rectum/1X1cm abnormal lesion in the ascending colon (bx benign). TI normal for 10cm.  Prep difficult/inadequate. f/u TCS 09/2012 recommended   COLONOSCOPY  10/13/2004   Normal rectum/Diminutive polyps, splenic flexure, cold biopsied/removed.  Remainder of colonic mucosa appeared normal.   COLONOSCOPY N/A 12/14/2012   GUY:[id number] polyp-tubular adenoma   COLONOSCOPY WITH PROPOFOL N/A 12/09/2022   Procedure: COLONOSCOPY WITH PROPOFOL;  Surgeon: Benancio Deeds, MD;  Location: [facility];  Service: Gastroenterology;  Laterality: N/A;   ESOPHAGOGASTRODUODENOSCOPY  10/13/2004    Normal esophagus/ Nodular volcano like lesion in the antrum, either representing a  pancreatic  rest or leiomyoma, biopsied.  Remainder of the gastric mucosa appeared normal, normal D1-D2   ESOPHAGOGASTRODUODENOSCOPY  10/24/2009   Benign biopsies. normal esophagus/small hiatal hernia/nodular lesion antrum/distal greater curvature. duodenal AVM s/p ablation   GIVENS CAPSULE STUDY  07/27/2010    multiple arteriovenous malformations which could definitely be the  contributor to her drifting hemoglobin and hematocrit   HEMOSTASIS CONTROL  04/05/2023   Procedure: HEMOSTASIS CONTROL;  Surgeon: Luciano Cutter, MD;  Location: [facility];  Service: Cardiopulmonary;;   HOT HEMOSTASIS N/A 12/09/2022   Procedure: HOT HEMOSTASIS (ARGON PLASMA COAGULATION/BICAP);  Surgeon: Benancio Deeds, MD;  Location: [facility];  Service: Gastroenterology;  Laterality: N/A;   IR THORACENTESIS ASP PLEURAL SPACE W/IMG GUIDE  12/16/2020   POLYPECTOMY  12/09/2022   Procedure: POLYPECTOMY;  Surgeon: Benancio Deeds, MD;  Location: [facility];  Service: Gastroenterology;;   THORACENTESIS  04/05/2023   Procedure: THORACENTESIS;  Surgeon: Luciano Cutter, MD;  Location: [facility];  Service: Cardiopulmonary;;   THORACENTESIS Right 06/08/2023   Procedure: Alanson Puls;  Surgeon: Lorin Glass, MD;  Location: [facility];  Service: Pulmonary;  Laterality: Right;   TUBAL LIGATION     US ECHOCARDIOGRAPHY     VIDEO BRONCHOSCOPY N/A 04/05/2023   Procedure: VIDEO BRONCHOSCOPY WITHOUT FLUORO;  Surgeon: Luciano Cutter, MD;  Location: [facility];  Service: Cardiopulmonary;  Laterality: N/A;   VIDEO BRONCHOSCOPY WITH ENDOBRONCHIAL ULTRASOUND N/A 04/27/2018   Procedure: VIDEO BRONCHOSCOPY WITH ENDOBRONCHIAL ULTRASOUND;  Surgeon: Loreli Slot, MD;  Location: [facility] OR;  Service: Thoracic;  Laterality: N/A;     A IV Location/Drains/Wounds Patient Lines/Drains/Airways Status     Active Line/Drains/Airways     Name Placement date Placement time Site Days   Peripheral IV 08/02/23 18 G 1.75" Anterior;Right Forearm 08/02/23  --  Forearm  less than 1   Peripheral IV 08/02/23 20 G 1.88" Right Antecubital 08/02/23  1733  Antecubital  less than 1            Intake/Output Last 24 hours No intake or output data in the 24 hours ending 08/02/23 2010  Labs/Imaging Results for orders placed or performed during the hospital encounter of 08/02/23 (from the past 48  hours)  CBC with Differential/Platelet     Status: Abnormal   Collection Time: 08/02/23  4:46 PM  Result Value Ref Range   WBC 6.4 4.0 - 10.5 K/uL   RBC 4.18 3.87 - 5.11 MIL/uL   Hemoglobin 11.0 (L) 12.0 - 15.0 g/dL   HCT 21.3 08.6 - 57.8 %   MCV 90.7 80.0 - 100.0 fL   MCH 26.3 26.0 - 34.0 pg   MCHC 29.0 (L) 30.0 - 36.0 g/dL   RDW 46.9 (H) 62.9 - 52.8 %   Platelets 187 150 - 400 K/uL   nRBC 0.0 0.0 - 0.2 %   Neutrophils Relative % 88 %   Neutro Abs 5.6 1.7 - 7.7 K/uL   Lymphocytes Relative 7 %   Lymphs Abs 0.5 (L) 0.7 - 4.0 K/uL   Monocytes Relative 4 %   Monocytes Absolute 0.3 0.1 - 1.0 K/uL   Eosinophils Relative 0 %   Eosinophils Absolute 0.0 0.0 - 0.5 K/uL   Basophils Relative 0 %   Basophils Absolute 0.0 0.0 - 0.1 K/uL   Immature Granulocytes 1 %   Abs Immature Granulocytes 0.03 0.00 - 0.07 K/uL    Comment: Performed at [facility], [address], [place], [place] [address]  Comprehensive metabolic  panel     Status: Abnormal   Collection Time: 08/02/23  4:46 PM  Result Value Ref Range   Sodium 134 (L) 135 - 145 mmol/L   Potassium >7.5 (HH) 3.5 - 5.1 mmol/L    Comment: CRITICAL RESULT CALLED TO, READ BACK BY AND VERIFIED WITH J MOOREFIELD RN 08/02/2023 1811 BNUNNERY NO VISIBLE HEMOLYSIS CORRECTED ON 12/17 AT 1820: PREVIOUSLY REPORTED AS >7.5 CRITICAL RESULT CALLED TO, READ BACK BY AND VERIFIED WITH J MOOREFIELD RN 08/02/2023 1811 BNUNNERY    Chloride 114 (H) 98 - 111 mmol/L   CO2 14 (L) 22 - 32 mmol/L   Glucose, Bld 208 (H) 70 - 99 mg/dL    Comment: Glucose reference range applies only to samples taken after fasting for at least 8 hours.   BUN 50 (H) 8 - 23 mg/dL   Creatinine, Ser 7.82 (H) 0.44 - 1.00 mg/dL   Calcium 8.7 (L) 8.9 - 10.3 mg/dL   Total Protein 7.7 6.5 - 8.1 g/dL   Albumin 3.5 3.5 - 5.0 g/dL   AST 956 (H) 15 - 41 U/L   ALT 251 (H) 0 - 44 U/L   Alkaline Phosphatase 142 (H) 38 - 126 U/L   Total Bilirubin 0.5 <1.2 mg/dL   GFR, Estimated 29 (L)  >60 mL/min    Comment: (NOTE) Calculated using the CKD-EPI Creatinine Equation (2021)    Anion gap 6 5 - 15    Comment: Performed at [facility], [address] [place], [place] [address]  I-Stat Lactic Acid     Status: None   Collection Time: 08/02/23  5:26 PM  Result Value Ref Range   Lactic Acid, Venous 1.0 0.5 - 1.9 mmol/L  CBG monitoring, ED     Status: Abnormal   Collection Time: 08/02/23  5:27 PM  Result Value Ref Range   Glucose-Capillary 184 (H) 70 - 99 mg/dL    Comment: Glucose reference range applies only to samples taken after fasting for at least 8 hours.  CBG monitoring, ED     Status: Abnormal   Collection Time: 08/02/23  7:24 PM  Result Value Ref Range   Glucose-Capillary 206 (H) 70 - 99 mg/dL    Comment: Glucose reference range applies only to samples taken after fasting for at least 8 hours.  I-Stat Lactic Acid     Status: None   Collection Time: 08/02/23  7:39 PM  Result Value Ref Range   Lactic Acid, Venous 1.4 0.5 - 1.9 mmol/L   DG Chest Port 1 View Result Date: 08/02/2023 CLINICAL DATA:  Bradycardia, concern for heart block EXAM: PORTABLE CHEST 1 VIEW COMPARISON:  06/08/2023 FINDINGS: Similar right perihilar opacity. Increased layering right pleural effusion and associated airspace opacities compared to 06/08/2023. The left lung is clear. No pneumothorax. Stable cardiomediastinal silhouette. Defibrillator pads. IMPRESSION: Increased layering right pleural effusion and associated airspace opacities compared to 06/08/2023. Pneumonia is difficult to exclude. Similar right perihilar opacity. Electronically Signed   By: Minerva Fester M.D.   On: 08/02/2023 18:59    Pending Labs Unresulted Labs (From admission, onward)     Start     Ordered   08/03/23 0500  CBC  Tomorrow morning,   R        08/02/23 1958   08/03/23 0500  Magnesium  Tomorrow morning,   R        08/02/23 1958   08/03/23 0500  Phosphorus  Tomorrow morning,   R  08/02/23 1958    08/03/23 0500  Comprehensive metabolic panel  Tomorrow morning,   R        08/02/23 1958   08/02/23 2300  Potassium  Every 6 hours,   R (with TIMED occurrences)      08/02/23 1959   08/02/23 1932  Glucose, random  ((EMERGENT) ECG changes & Potassium > 5.5)  Once,   STAT       Comments: 1 hour after insulin administered.    08/02/23 1817   08/02/23 1932  TSH  Once,   URGENT        08/02/23 1931   08/02/23 1653  C Difficile Quick Screen w PCR reflex  (C Difficile quick screen w PCR reflex panel )  Once, for 24 hours,   URGENT       References:    CDiff Information Tool   08/02/23 1652   08/02/23 1653  Gastrointestinal Panel by PCR , Stool  (Gastrointestinal Panel by PCR, Stool                                                                                                                                                     **Does Not include CLOSTRIDIUM DIFFICILE testing. **If CDIFF testing is needed, place order from the "C Difficile Testing" order set.**)  Once,   URGENT        08/02/23 1652            Vitals/Pain Today's Vitals   08/02/23 1914 08/02/23 1930 08/02/23 1945 08/02/23 1951  BP:  (!) 151/135 (!) 138/93   Pulse: 88 94 92   Resp: (!) 29 (!) 23 20   Temp:      SpO2: 100% 99% 100%   PainSc:    0-No pain    Isolation Precautions Enteric precautions (UV disinfection)  Medications Medications  0.9 %  sodium chloride infusion ( Intravenous New Bag/Given 08/02/23 1805)  atropine 1 MG/10ML injection 1 mg (0 mg Intravenous Hold 08/02/23 1912)  sodium bicarbonate 150 mEq in dextrose 5 % 1,150 mL infusion ( Intravenous New Bag/Given 08/02/23 1943)  docusate sodium (COLACE) capsule 100 mg (has no administration in time range)  polyethylene glycol (MIRALAX / GLYCOLAX) packet 17 g (has no administration in time range)  heparin injection 5,000 Units (has no administration in time range)  ondansetron (ZOFRAN) injection 4 mg (has no administration in time range)  insulin aspart  (novoLOG) injection 0-9 Units (has no administration in time range)  fluticasone furoate-vilanterol (BREO ELLIPTA) 200-25 MCG/ACT 1 puff (has no administration in time range)  diphenhydrAMINE (BENADRYL) 2 % cream 1 application  (has no administration in time range)  multivitamin capsule 1 capsule (has no administration in time range)  meclizine (ANTIVERT) tablet 25 mg (has no administration in time range)  levothyroxine (SYNTHROID) tablet 75 mcg (has no administration  in time range)  sodium chloride 0.9 % bolus 500 mL (0 mLs Intravenous Stopped 08/02/23 1806)  insulin aspart (novoLOG) injection 5 Units (5 Units Intravenous Given 08/02/23 1830)    And  dextrose 50 % solution 50 mL (50 mLs Intravenous Given 08/02/23 1830)  calcium gluconate 1 g/ 50 mL sodium chloride IVPB (0 mg Intravenous Stopped 08/02/23 1848)  sodium zirconium cyclosilicate (LOKELMA) packet 10 g (10 g Oral Given 08/02/23 1943)  sodium bicarbonate injection 50 mEq (50 mEq Intravenous Given 08/02/23 1902)    Mobility walks with device     Focused Assessments     R Recommendations: See Admitting Provider Note  Report given to:   Additional Notes:  Prior to sodium bicarb HR was anywhere between the 40s-80s

## 2023-08-02 NOTE — H&P (Signed)
NAME:  Jenna [name], MRN:  [id number], DOB:  [birth year], LOS: 0 ADMISSION DATE:  08/02/2023 CONSULTATION DATE:  08/02/2023 REFERRING MD:  Deretha Emory - EDP CHIEF COMPLAINT:  CHB, hyperkalemia   History of Present Illness:  77 year old woman who presented to [facility] ED 12/17 with weakness, nausea/vomiting/diarrhea. PMHx significant for HTN, HLD, T2DM, GERD, thyroid disease, IDA, NSCLC.  History is obtained primarily from chart review. Per EMS, episodes of bradycardia into the 30s were noted alternating with Afib (rate 80s). MAP maintained > 65. On ED arrival, patient was afebrile with HR 80s, BP 117/61, SpO2 WNL. EKG concerning for complete heart block; this was initially thought to be due to home medications but instead found to be secondary to hyperkalemia. Labs were notable for WBC 6.4, Hgb 11.0 (baseline), Plt 187. Na 134, K >7.5, CO2 14, BUN/Cr 50/1.80 (baseline 1.4-1.5). AST/ALT elevated to 406 and 251, Alk Phos 142, Tbili 0.5. LA 1.0. CBGs elevated to 200s. CXR demonstrated layering R pleural effusion with associated airspace opacities (s/p thora 05/2023 with chronic inflammation, no malignant cells). Ca gluconate, insulin/D50 and bicarb were administered with some improvement in HR to 70s. Bicarb gtt was started.  PCCM consulted for ICU admission and management.  Pertinent Medical History:   Past Medical History:  Diagnosis Date   Blood transfusion without reported diagnosis    Cataract    DM (diabetes mellitus) (HCC)    Dyspnea    GERD (gastroesophageal reflux disease)    Hyperlipidemia    Hypertension    Iron deficiency anemia    NSCL ca dx'd 03/2018   Pneumonia    Thyroid disease    Significant Hospital Events: Including procedures, antibiotic start and stop dates in addition to other pertinent events   12/17 - Presented to [facility] with bradycardia/junctional rhythm vs CHB, rate 30s. Hyperkalemic to > 7.5. CO2 14. HR improved to 70s with shifting K. PCCM consulted for ICU  admission.  Interim History / Subjective:  PCCM consulted for ICU admission.  Objective:  Blood pressure 117/61, pulse 88, temperature (!) 97.5 F (36.4 C), resp. rate (!) 29, SpO2 100%.       No intake or output data in the 24 hours ending 08/02/23 1930 There were no vitals filed for this visit.  Physical Examination: Very pleasant, no distress denies complaints (she does this, per daughter she has been having some SOB recently) Abd soft, heart sounds regular, ext warm, diminished breath sounds R base Moves to command Sinus tele on monitor Aox3  Labs noted  Resolved Hospital Problem List:    Assessment & Plan:  Junctional rhythm in the setting of severe hyperkalemia AKI- on ARB and eating bananas PTA ALI- query from congestion in low flow state ?GI illness vs. 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 - Could consider another thora but she really does not benefit much from this due to entrapment - Continue to monitor - Resume home bronchodilators - F/u workup for elevated LFTs as below, r/o metastasis  HTN HLD - Hold home  antihypertensives for now - Hold home statin given ALI  T2DM - SSI - CBGs Q4H - Goal CBG 140-180 - Hold home Lantus, Farxiga  Thyroid disease - Resume home Synthroid  Iron deficiency anemia - Trend H&H - Monitor for signs of active bleeding - Transfuse for Hgb < 7.0 or hemodynamically significant bleeding  Best Practice: (right click and "Reselect all  SmartList Selections" daily)   Diet/type: NPO, ok for sips DVT prophylaxis: SCDs, SQH GI prophylaxis: N/A Lines: N/A Foley:  N/A Code Status:  full code Last date of multidisciplinary goals of care discussion [Pending]  Labs:  CBC: Recent Labs  Lab 08/02/23 1646  WBC 6.4  NEUTROABS 5.6  HGB 11.0*  HCT 37.9  MCV 90.7  PLT 187   Basic Metabolic Panel: Recent Labs  Lab 08/02/23 1646  NA 134*  K >7.5*  CL 114*  CO2 14*  GLUCOSE 208*  BUN 50*  CREATININE 1.80*  CALCIUM 8.7*   GFR: CrCl cannot be calculated (Unknown ideal weight.). Recent Labs  Lab 08/02/23 1646 08/02/23 1726  WBC 6.4  --   LATICACIDVEN  --  1.0   Liver Function Tests: Recent Labs  Lab 08/02/23 1646  AST 406*  ALT 251*  ALKPHOS 142*  BILITOT 0.5  PROT 7.7  ALBUMIN 3.5   No results for input(s): "LIPASE", "AMYLASE" in the last 168 hours. No results for input(s): "AMMONIA" in the last 168 hours.  ABG: No results found for: "PHART", "PCO2ART", "PO2ART", "HCO3", "TCO2", "ACIDBASEDEF", "O2SAT"   Coagulation Profile: No results for input(s): "INR", "PROTIME" in the last 168 hours.  Cardiac Enzymes: No results for input(s): "CKTOTAL", "CKMB", "CKMBINDEX", "TROPONINI" in the last 168 hours.  HbA1C: Hemoglobin A1C  Date/Time Value Ref Range Status  05/18/2023 01:53 PM 6.3 (A) 4.0 - 5.6 % Final  06/16/2022 10:30 AM 6.9 (A) 4.0 - 5.6 % Final   Hgb A1c MFr Bld  Date/Time Value Ref Range Status  12/22/2022 10:25 AM 6.9 (H) 4.6 - 6.5 % Final    Comment:    Glycemic Control Guidelines for People with Diabetes:Non Diabetic:  <6%Goal of Therapy: <7%Additional Action Suggested:  >8%   12/16/2020 05:00 AM 8.8 (H) 4.8 - 5.6 % Final    Comment:    (NOTE) Pre diabetes:          5.7%-6.4%  Diabetes:              >6.4%  Glycemic control for   <7.0% adults with diabetes    CBG: Recent Labs  Lab 08/02/23 1727 08/02/23 1924  GLUCAP 184* 206*   Review of Systems:    Positive Symptoms in  bold:  Constitutional fevers, chills, weight loss, fatigue, anorexia, malaise  Eyes decreased vision, double vision, eye irritation  Ears, Nose, Mouth, Throat sore throat, trouble swallowing, sinus congestion  Cardiovascular chest pain, paroxysmal nocturnal dyspnea, lower ext edema, palpitations   Respiratory SOB, cough, DOE, hemoptysis, wheezing  Gastrointestinal nausea, vomiting, diarrhea  Genitourinary burning with urination, trouble urinating  Musculoskeletal joint aches, joint swelling, back pain  Integumentary  rashes, skin lesions  Neurological focal weakness, focal numbness, trouble speaking, headaches  Psychiatric depression, anxiety, confusion  Endocrine polyuria, polydipsia, cold intolerance, heat intolerance  Hematologic abnormal bruising, abnormal bleeding, unexplained nose bleeds  Allergic/Immunologic recurrent infections, hives, swollen lymph nodes     Past Medical History:  She,  has a past medical history of Blood transfusion without reported diagnosis, Cataract, DM (diabetes mellitus) (HCC), Dyspnea, GERD (gastroesophageal reflux disease), Hyperlipidemia,  Hypertension, Iron deficiency anemia, NSCL ca (dx'd 03/2018), Pneumonia, and Thyroid disease.   Surgical History:   Past Surgical History:  Procedure Laterality Date   BIOPSY  12/09/2022   Procedure: BIOPSY;  Surgeon: Benancio Deeds, MD;  Location: [facility];  Service: Gastroenterology;;   BRONCHIAL BIOPSY  04/05/2023   Procedure: BRONCHIAL BIOPSIES;  Surgeon: Luciano Cutter, MD;  Location: [facility];  Service: Cardiopulmonary;;   BRONCHIAL BRUSHINGS  04/05/2023   Procedure: BRONCHIAL BRUSHINGS;  Surgeon: Luciano Cutter, MD;  Location: [facility];  Service: Cardiopulmonary;;   BRONCHIAL WASHINGS  04/05/2023   Procedure: BRONCHIAL WASHINGS;  Surgeon: Luciano Cutter, MD;  Location: [facility];  Service: Cardiopulmonary;;   CATARACT EXTRACTION Right    CHOLECYSTECTOMY     COLONOSCOPY   10/24/2009   normal rectum/1X1cm abnormal lesion in the ascending colon (bx benign). TI normal for 10cm.  Prep difficult/inadequate. f/u TCS 09/2012 recommended   COLONOSCOPY  10/13/2004   Normal rectum/Diminutive polyps, splenic flexure, cold biopsied/removed.  Remainder of colonic mucosa appeared normal.   COLONOSCOPY N/A 12/14/2012   VQQ:[id number] polyp-tubular adenoma   COLONOSCOPY WITH PROPOFOL N/A 12/09/2022   Procedure: COLONOSCOPY WITH PROPOFOL;  Surgeon: Benancio Deeds, MD;  Location: [facility];  Service: Gastroenterology;  Laterality: N/A;   ESOPHAGOGASTRODUODENOSCOPY  10/13/2004    Normal esophagus/ Nodular volcano like lesion in the antrum, either representing a  pancreatic rest or leiomyoma, biopsied.  Remainder of the gastric mucosa appeared normal, normal D1-D2   ESOPHAGOGASTRODUODENOSCOPY  10/24/2009   Benign biopsies. normal esophagus/small hiatal hernia/nodular lesion antrum/distal greater curvature. duodenal AVM s/p ablation   GIVENS CAPSULE STUDY  07/27/2010    multiple arteriovenous malformations which could definitely be the contributor to her drifting hemoglobin and hematocrit   HEMOSTASIS CONTROL  04/05/2023   Procedure: HEMOSTASIS CONTROL;  Surgeon: Luciano Cutter, MD;  Location: [facility];  Service: Cardiopulmonary;;   HOT HEMOSTASIS N/A 12/09/2022   Procedure: HOT HEMOSTASIS (ARGON PLASMA COAGULATION/BICAP);  Surgeon: Benancio Deeds, MD;  Location: [facility];  Service: Gastroenterology;  Laterality: N/A;   IR THORACENTESIS ASP PLEURAL SPACE W/IMG GUIDE  12/16/2020   POLYPECTOMY  12/09/2022   Procedure: POLYPECTOMY;  Surgeon: Benancio Deeds, MD;  Location: [facility];  Service: Gastroenterology;;   THORACENTESIS  04/05/2023   Procedure: THORACENTESIS;  Surgeon: Luciano Cutter, MD;  Location: [facility];  Service: Cardiopulmonary;;   THORACENTESIS Right 06/08/2023   Procedure: Alanson Puls;  Surgeon: Lorin Glass, MD;  Location: [facility]  END[facility]COPY;  Service: Pulmonary;  Laterality: Right;   TUBAL LIGATION     US ECHOCARDIOGRAPHY     VIDEO BRONCHOSCOPY N/A 04/05/2023   Procedure: VIDEO BRONCHOSCOPY WITHOUT FLUORO;  Surgeon: Luc[facility] Cutter, MD;  Location: [facility];  Service: Cardiopulmonary;  Laterality: N/A;   VIDEO BRONCHOSCOPY WITH ENDOBRONCHIAL ULTRASOUND N/A 04/27/2018   Procedure: VIDEO BRONCHOSCOPY WITH ENDOBRONCHIAL ULTRASOUND;  Surgeon: Loreli Slot, MD;  Location: [facility] OR;  Service: Thoracic;  Laterality: N/A;   Social History:   reports that she quit smoking about 5 years ago. Her smoking use included cigarettes. She started smoking about 60 years ago. She has a 27.5 pack-year smoking history. She has never used smokeless tobacco. She reports that she does not drink alcohol and does not use drugs.   Family History:  Her family history includes Breast cancer in her cousin; Colon cancer in her maternal aunt; Diabetes in her daughter, daughter, and sister. There is no history of Amblyopia, Blindness, Cataracts, Glaucoma, Macular  degeneration, Retinal detachment, Strabismus, Retinitis pigmentosa, Rectal cancer, Stomach cancer, Colon polyps, or Esophageal cancer.   Allergies: Allergies  Allergen Reactions   Paclitaxel Other (See Comments)    Unresponsiveness shortly after Taxol inf started 06/12/18.   Aspirin Other (See Comments)    Stomach bleeding    Esomeprazole Magnesium     UNSPECIFIED REACTION    Ace Inhibitors Other (See Comments)    Dizziness, drunk like   Home Medications: Prior to Admission medications   Medication Sig Start Date End Date Taking? Authorizing Provider  diphenhydrAMINE (BENADRYL) 2 % cream Apply 1 application topically as needed for itching.   Yes [provider]  FARXIGA 10 MG TABS tablet Take 1 tablet (10 mg total) by mouth daily before breakfast. Follow-up appt due in Nov must see provider for future refills 06/09/23  Yes Motwani, Komal, MD  insulin glargine (LANTUS  SOLOSTAR) 100 UNIT/ML Solostar Pen Inject 34 Units into the skin daily. And 31G, 5mm pen needles 1/day Patient taking differently: Inject 30 Units into the skin daily. And 31G, 5mm pen needles 1/day 12/22/22  Yes [name], MD  labetalol (NORMODYNE) 100 MG tablet Take 0.5 tablets (50 mg total) by mouth 2 (two) times daily. 02/11/23  Yes [name], MD  levothyroxine (SYNTHROID) 75 MCG tablet Take 1 tablet by mouth once daily 01/17/23  Yes [name], MD  losartan (COZAAR) 50 MG tablet Take 1 tablet by mouth once daily 06/13/23  Yes [name], MD  meclizine (ANTIVERT) 25 MG tablet Take 1 tablet (25 mg total) by mouth 3 (three) times daily as needed for dizziness. 05/21/23  Yes Dione Booze, MD  Multiple Vitamin (MULTIVITAMIN) capsule Take 1 capsule by mouth daily.   Yes [provider]  pravastatin (PRAVACHOL) 20 MG tablet Take 1 tablet (20 mg total) by mouth daily. 01/17/23  Yes [name], MD  Accu-Chek Softclix Lancets lancets Use as instructed 06/16/23   Altamese [place], MD  albuterol (VENTOLIN HFA) 108 (90 Base) MCG/ACT inhaler Inhale 1-2 puffs into the lungs every 6 (six) hours as needed for wheezing or shortness of breath. Patient not taking: Reported on 08/02/2023 01/24/23   Heilingoetter, Cassandra L, PA-C  blood glucose meter kit and supplies KIT Use up to four times daily as directed/accu check guide kit 06/22/23   Altamese [place], MD  blood glucose meter kit and supplies Dispense based on patient and insurance preference. Use up to four times daily as directed. (FOR ICD-10 E10.9, E11.9). 06/16/23   Motwani, Carin Hock, MD  Continuous Glucose Sensor (DEXCOM G7 SENSOR) MISC 1 Device by Does not apply route continuous. Patient not taking: Reported on 08/02/2023 01/27/23   Altamese [place], MD  Ferrous Sulfate (IRON) 325 (65 Fe) MG TABS Take 1 tablet (325 mg total) by mouth 2 (two) times daily with a meal. Patient not taking: Reported on 08/02/2023  12/25/20   Cirigliano, Corrie Dandy K, DO  fluticasone furoate-vilanterol (BREO ELLIPTA) 200-25 MCG/ACT AEPB Inhale 1 puff into the lungs daily. Patient not taking: Reported on 08/02/2023 09/14/21   [name], MD  Glucose Blood (BLOOD GLUCOSE TEST STRIPS 333) STRP 1 strip by In Vitro route 4 (four) times daily. 06/20/23   Motwani, Carin Hock, MD  Insulin Pen Needle 32G X 4 MM MISC Inject 1 Needle into the skin every morning. 07/07/23   Altamese [place], MD   Critical care time:   The patient is critically ill with multiple organ system failure and requires high complexity decision  making for assessment and support, frequent evaluation and titration of therapies, advanced monitoring, review of radiographic studies and interpretation of complex data.   Critical Care Time devoted to patient care services, exclusive of separately billable procedures, described in this note is 33 mins minutes.  Myrla Halsted MD Tim Lair, PA-C [facility] 08/02/23 7:30 PM  Please see [email or web address] for pager details.  From 7A-7P if no response, please call [phone] After hours, please call ELink [phone]

## 2023-08-03 DIAGNOSIS — N179 Acute kidney failure, unspecified: Secondary | ICD-10-CM | POA: Diagnosis not present

## 2023-08-03 DIAGNOSIS — E875 Hyperkalemia: Secondary | ICD-10-CM | POA: Diagnosis not present

## 2023-08-03 LAB — COMPREHENSIVE METABOLIC PANEL
ALT: 312 U/L — ABNORMAL HIGH (ref 0–44)
ALT: 243 U/L — ABNORMAL HIGH (ref 0–44)
AST: 326 U/L — ABNORMAL HIGH (ref 15–41)
AST: 165 U/L — ABNORMAL HIGH (ref 15–41)
Albumin: 2.9 g/dL — ABNORMAL LOW (ref 3.5–5.0)
Albumin: 3 g/dL — ABNORMAL LOW (ref 3.5–5.0)
Alkaline Phosphatase: 132 U/L — ABNORMAL HIGH (ref 38–126)
Alkaline Phosphatase: 122 U/L (ref 38–126)
Anion gap: 7 (ref 5–15)
Anion gap: 6 (ref 5–15)
BUN: 42 mg/dL — ABNORMAL HIGH (ref 8–23)
BUN: 38 mg/dL — ABNORMAL HIGH (ref 8–23)
CO2: 19 mmol/L — ABNORMAL LOW (ref 22–32)
CO2: 24 mmol/L (ref 22–32)
Calcium: 8.3 mg/dL — ABNORMAL LOW (ref 8.9–10.3)
Calcium: 8.4 mg/dL — ABNORMAL LOW (ref 8.9–10.3)
Chloride: 113 mmol/L — ABNORMAL HIGH (ref 98–111)
Chloride: 111 mmol/L (ref 98–111)
Creatinine, Ser: 1.55 mg/dL — ABNORMAL HIGH (ref 0.44–1.00)
Creatinine, Ser: 1.7 mg/dL — ABNORMAL HIGH (ref 0.44–1.00)
GFR, Estimated: 34 mL/min — ABNORMAL LOW (ref >60)
GFR, Estimated: 31 mL/min — ABNORMAL LOW (ref >60)
Glucose, Bld: 110 mg/dL — ABNORMAL HIGH (ref 70–99)
Glucose, Bld: 78 mg/dL (ref 70–99)
Potassium: 5.2 mmol/L — ABNORMAL HIGH (ref 3.5–5.1)
Potassium: 4.7 mmol/L (ref 3.5–5.1)
Sodium: 139 mmol/L (ref 135–145)
Sodium: 141 mmol/L (ref 135–145)
Total Bilirubin: 0.5 mg/dL (ref <1.2)
Total Bilirubin: 0.5 mg/dL (ref <1.2)
Total Protein: 6.5 g/dL (ref 6.5–8.1)
Total Protein: 6.8 g/dL (ref 6.5–8.1)

## 2023-08-03 LAB — PHOSPHORUS: Phosphorus: 3.8 mg/dL (ref 2.5–4.6)

## 2023-08-03 LAB — GLUCOSE, CAPILLARY
Glucose-Capillary: 110 mg/dL — ABNORMAL HIGH (ref 70–99)
Glucose-Capillary: 91 mg/dL (ref 70–99)
Glucose-Capillary: 57 mg/dL — ABNORMAL LOW (ref 70–99)
Glucose-Capillary: 118 mg/dL — ABNORMAL HIGH (ref 70–99)
Glucose-Capillary: 142 mg/dL — ABNORMAL HIGH (ref 70–99)
Glucose-Capillary: 141 mg/dL — ABNORMAL HIGH (ref 70–99)
Glucose-Capillary: 65 mg/dL — ABNORMAL LOW (ref 70–99)

## 2023-08-03 LAB — BASIC METABOLIC PANEL
Anion gap: 8 (ref 5–15)
BUN: 35 mg/dL — ABNORMAL HIGH (ref 8–23)
CO2: 21 mmol/L — ABNORMAL LOW (ref 22–32)
Calcium: 8.2 mg/dL — ABNORMAL LOW (ref 8.9–10.3)
Chloride: 111 mmol/L (ref 98–111)
Creatinine, Ser: 1.39 mg/dL — ABNORMAL HIGH (ref 0.44–1.00)
GFR, Estimated: 39 mL/min — ABNORMAL LOW (ref >60)
Glucose, Bld: 102 mg/dL — ABNORMAL HIGH (ref 70–99)
Potassium: 4.3 mmol/L (ref 3.5–5.1)
Sodium: 140 mmol/L (ref 135–145)

## 2023-08-03 LAB — POTASSIUM
Potassium: 4.5 mmol/L (ref 3.5–5.1)
Potassium: 4.5 mmol/L (ref 3.5–5.1)

## 2023-08-03 LAB — CBC
HCT: 30.5 % — ABNORMAL LOW (ref 36.0–46.0)
Hemoglobin: 9.4 g/dL — ABNORMAL LOW (ref 12.0–15.0)
MCH: 26.9 pg (ref 26.0–34.0)
MCHC: 30.8 g/dL (ref 30.0–36.0)
MCV: 87.1 fL (ref 80.0–100.0)
Platelets: 170 10*3/uL (ref 150–400)
RBC: 3.5 MIL/uL — ABNORMAL LOW (ref 3.87–5.11)
RDW: 15.6 % — ABNORMAL HIGH (ref 11.5–15.5)
WBC: 4.7 10*3/uL (ref 4.0–10.5)
nRBC: 0 % (ref 0.0–0.2)

## 2023-08-03 LAB — MAGNESIUM: Magnesium: 2.2 mg/dL (ref 1.7–2.4)

## 2023-08-03 MED ORDER — INSULIN ASPART 100 UNIT/ML IJ SOLN
0.0000 [IU] | Freq: Every day | INTRAMUSCULAR | Status: DC
Start: 2023-08-03 — End: 2023-08-05

## 2023-08-03 MED ORDER — DEXTROSE 50 % IV SOLN
INTRAVENOUS | Status: AC
  Administered 2023-08-03: 12.5 g via INTRAVENOUS
  Filled 2023-08-03: qty 50

## 2023-08-03 MED ORDER — DEXTROSE 50 % IV SOLN: 12.5000 g | Freq: Once | INTRAVENOUS | Status: AC

## 2023-08-03 MED ORDER — SODIUM BICARBONATE 8.4 % IV SOLN
INTRAVENOUS | Status: DC
  Filled 2023-08-03 (×4): qty 1000

## 2023-08-03 MED ORDER — INSULIN ASPART 100 UNIT/ML IJ SOLN
0.0000 [IU] | Freq: Three times a day (TID) | INTRAMUSCULAR | Status: DC
  Administered 2023-08-04 (×2): 3 [IU] via SUBCUTANEOUS
  Administered 2023-08-05: 5 [IU] via SUBCUTANEOUS

## 2023-08-03 NOTE — Progress Notes (Signed)
PCCM interval note  Patient doing well, up to chair.  No evidence of dysrhythmia     Latest Ref Rng & Units 08/03/2023    1:12 PM 08/03/2023   10:57 AM 08/03/2023    4:11 AM  BMP  Glucose 70 - 99 mg/dL 841   660   BUN 8 - 23 mg/dL 35   42   Creatinine 6.30 - 1.00 mg/dL 1.60   1.09   Sodium 323 - 145 mmol/L 140   139   Potassium 3.5 - 5.1 mmol/L 4.3  4.5  5.2   Chloride 98 - 111 mmol/L 111   113   CO2 22 - 32 mmol/L 21   19   Calcium 8.9 - 10.3 mg/dL 8.2   8.3     Renal function and potassium have both improved.  We will plan to move her out of the ICU to a telemetry bed today 12/18 Turn off her bicarbonate drip this afternoon Continue to follow her BMP closely Hold her labetalol, losartan overnight, consider restart on 12/19 if remains stable   Will asked [facility] to assume her care as of 12/19   Levy Pupa, MD, PhD 08/03/2023, 3:40 PM [facility] [phone] or if no answer before 7:00PM call [phone] For any issues after 7:00PM please call eLink [phone]

## 2023-08-03 NOTE — Progress Notes (Signed)
Pt received to 2 west room 20 from MICU.  Pt oriented to call bell and room.  Pt denies pain or distress.  See assessment and vs's.  Afib 80's.

## 2023-08-03 NOTE — Progress Notes (Signed)
NAME:  Jenna [name], MRN:  [id number], DOB:  [birth year], LOS: 1 ADMISSION DATE:  08/02/2023 CONSULTATION DATE:  08/02/2023 REFERRING MD:  Deretha Emory - EDP CHIEF COMPLAINT:  CHB, hyperkalemia   History of Present Illness:  77 year old woman who presented to [facility] ED 12/17 with weakness, nausea/vomiting/diarrhea. PMHx significant for HTN, HLD, T2DM, GERD, thyroid disease, IDA, NSCLC.  History is obtained primarily from chart review. Per EMS, episodes of bradycardia into the 30s were noted alternating with Afib (rate 80s). MAP maintained > 65. On ED arrival, patient was afebrile with HR 80s, BP 117/61, SpO2 WNL. EKG concerning for complete heart block; this was initially thought to be due to home medications but instead found to be secondary to hyperkalemia. Labs were notable for WBC 6.4, Hgb 11.0 (baseline), Plt 187. Na 134, K >7.5, CO2 14, BUN/Cr 50/1.80 (baseline 1.4-1.5). AST/ALT elevated to 406 and 251, Alk Phos 142, Tbili 0.5. LA 1.0. CBGs elevated to 200s. CXR demonstrated layering R pleural effusion with associated airspace opacities (s/p thora 05/2023 with chronic inflammation, no malignant cells). Ca gluconate, insulin/D50 and bicarb were administered with some improvement in HR to 70s. Bicarb gtt was started.  PCCM consulted for ICU admission and management.  Pertinent Medical History:   Past Medical History:  Diagnosis Date   Blood transfusion without reported diagnosis    Cataract    DM (diabetes mellitus) (HCC)    Dyspnea    GERD (gastroesophageal reflux disease)    Hyperlipidemia    Hypertension    Iron deficiency anemia    NSCL ca dx'd 03/2018   Pneumonia    Thyroid disease    Significant Hospital Events: Including procedures, antibiotic start and stop dates in addition to other pertinent events   12/17 - Presented to [facility] with bradycardia/junctional rhythm vs CHB, rate 30s. Hyperkalemic to > 7.5. CO2 14. HR improved to 70s with shifting K. PCCM consulted for ICU  admission.  Interim History / Subjective:   Potassium >7.5 >> 5.2 with medical therapy SCr 1.80 > 1.55; CO2 19 on bicarbonate infusion Persistent transaminitis More awake today, remains n.p.o.  Objective:  Blood pressure 125/75, pulse 75, temperature 99.1 F (37.3 C), temperature source Oral, resp. rate (!) 22, SpO2 96%.        Intake/Output Summary (Last 24 hours) at 08/03/2023 1138 Last data filed at 08/03/2023 0700 Gross per 24 hour  Intake 1916.88 ml  Output 375 ml  Net 1541.88 ml   There were no vitals filed for this visit.  Physical Examination: Very pleasant, no distress denies complaints (she does this, per daughter she has been having some SOB recently) Abd soft, heart sounds regular, ext warm, diminished breath sounds R base Moves to command Sinus tele on monitor Aox3  Labs noted  Resolved Hospital Problem List:    Assessment & Plan:  Junctional rhythm in the setting of severe hyperkalemia, improved AKI- on ARB and eating bananas PTA.  Renal ultrasound normal Non-anion gap metabolic acidosis ALI- query from congestion in low flow state.  RUQ ultrasound reassuring ?GI illness vs. Just effects of above -Responded to [facility], medical therapy for hyperkalemia.  Continue to check potassium and renal function for stability/improvement.  Repeat Lokelma depending on trend -Telemetry monitoring.  Appreciate cardiology consultation -Avoid all AV nodal blocking agents -Continue bicarbonate infusion for now pending next BMP.  Hopefully can discontinue 12/18 -Will consult nephrology if hyperkalemia persists or worsens, if any rhythm abnormalities -Possible gastroenteritis but no diarrhea or bowel movement since admission.  I believe we can discontinue the anterior precautions.  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 -Following conservatively.  No clear  indication for repeat thoracentesis at this time -Continue her home BD regimen  HTN HLD -Restart home antihypertensives when stable to do so -Statin on hold given transaminitis.  Restart when stable  T2DM -Sliding scale insulin as ordered.  Goal CBG 140-180 -Holding home Lantus, Farxiga.  Likely restart if she begins to take p.o.  Thyroid disease -Continue home levothyroxine  Iron deficiency anemia -Follow intermittent CBC -Transfusion goal hemoglobin 7.0   Best Practice: (right click and "Reselect all SmartList Selections" daily)   Diet/type: NPO, ok for sips DVT prophylaxis: SCDs, SQH GI prophylaxis: N/A Lines: N/A Foley:  N/A Code Status:  full code Last date of multidisciplinary goals of care discussion [Pending]  Labs:  CBC: Recent Labs  Lab 08/02/23 1646 08/03/23 0411  WBC 6.4 4.7  NEUTROABS 5.6  --   HGB 11.0* 9.4*  HCT 37.9 30.5*  MCV 90.7 87.1  PLT 187 170   Basic Metabolic Panel: Recent Labs  Lab 08/02/23 1646 08/02/23 1932 08/02/23 2306 08/03/23 0411  NA 134*  --   --  139  K >7.5*  --  5.9* 5.2*  CL 114*  --   --  113*  CO2 14*  --   --  19*  GLUCOSE 208* 190*  --  110*  BUN 50*  --   --  42*  CREATININE 1.80*  --   --  1.55*  CALCIUM 8.7*  --   --  8.3*  MG  --   --   --  2.2  PHOS  --   --   --  3.8   GFR: CrCl cannot be calculated (Unknown ideal weight.). Recent Labs  Lab 08/02/23 1646 08/02/23 1726 08/02/23 1939 08/03/23 0411  WBC 6.4  --   --  4.7  LATICACIDVEN  --  1.0 1.4  --    Liver Function Tests: Recent Labs  Lab 08/02/23 1646 08/03/23 0411  AST 406* 326*  ALT 251* 312*  ALKPHOS 142* 132*  BILITOT 0.5 0.5  PROT 7.7 6.5  ALBUMIN 3.5 2.9*   No results for input(s): "LIPASE", "AMYLASE" in the last 168 hours. No results for input(s): "AMMONIA" in the last 168 hours.  ABG: No results found for: "PHART", "PCO2ART", "PO2ART", "HCO3", "TCO2", "ACIDBASEDEF", "O2SAT"   Coagulation Profile: No results for input(s):  "INR", "PROTIME" in the last 168 hours.  Cardiac Enzymes: No results for input(s): "CKTOTAL", "CKMB", "CKMBINDEX", "TROPONINI" in the last 168 hours.  HbA1C: Hemoglobin A1C  Date/Time Value Ref Range Status  05/18/2023 01:53 PM 6.3 (A) 4.0 - 5.6 % Final  06/16/2022 10:30 AM 6.9 (A) 4.0 - 5.6 % Final   Hgb A1c MFr Bld  Date/Time Value Ref Range Status  12/22/2022 10:25 AM 6.9 (H) 4.6 - 6.5 % Final    Comment:    Glycemic Control Guidelines for People with Diabetes:Non Diabetic:  <6%Goal of Therapy: <7%Additional Action Suggested:  >8%   12/16/2020 05:00 AM 8.8 (H) 4.8 - 5.6 % Final    Comment:    (NOTE) Pre diabetes:          5.7%-6.4%  Diabetes:              >6.4%  Glycemic control for   <7.0% adults with diabetes    CBG: Recent Labs  Lab  08/02/23 2115 08/02/23 2309 08/03/23 0357 08/03/23 0709 08/03/23 1125  GLUCAP 154* 148* 110* 91 57*     Critical care time: 31 min    Levy Pupa, MD, PhD 08/03/2023, 11:38 AM [facility] [phone] or if no answer before 7:00PM call [phone] For any issues after 7:00PM please call eLink [phone]

## 2023-08-04 DIAGNOSIS — E875 Hyperkalemia: Secondary | ICD-10-CM | POA: Diagnosis not present

## 2023-08-04 LAB — GLUCOSE, CAPILLARY
Glucose-Capillary: 194 mg/dL — ABNORMAL HIGH (ref 70–99)
Glucose-Capillary: 113 mg/dL — ABNORMAL HIGH (ref 70–99)
Glucose-Capillary: 188 mg/dL — ABNORMAL HIGH (ref 70–99)
Glucose-Capillary: 112 mg/dL — ABNORMAL HIGH (ref 70–99)

## 2023-08-04 LAB — POTASSIUM
Potassium: 4.6 mmol/L (ref 3.5–5.1)
Potassium: 5 mmol/L (ref 3.5–5.1)
Potassium: 4.6 mmol/L (ref 3.5–5.1)
Potassium: 4.9 mmol/L (ref 3.5–5.1)

## 2023-08-04 MED ORDER — LOSARTAN POTASSIUM 50 MG PO TABS: 25.0000 mg | ORAL_TABLET | Freq: Every day | ORAL | Status: DC

## 2023-08-04 MED ORDER — LABETALOL HCL 100 MG PO TABS
50.0000 mg | ORAL_TABLET | Freq: Two times a day (BID) | ORAL | Status: DC
  Administered 2023-08-04 – 2023-08-05 (×3): 50 mg via ORAL
  Filled 2023-08-04 (×3): qty 0.5

## 2023-08-04 NOTE — Progress Notes (Signed)
HOSPITALIST ROUNDING NOTE Jenna [name] VWU:[id number]  DOB: 12-08-1945  DOA: 08/02/2023  PCP: [name], MD  08/04/2023,7:12 AM   LOS: 2 days      Code Status: full  From:  home     Current Dispo: likely home     77 year old home dwelling white female DM TY 2 follows with endocrinology Dr. Christell Constant 20 HTN CKD 3 with chronic anemia NSCLC with right pleural effusion 2019--follows with Dr. Agustin Cree carboplatin paclitaxel 5 cycles-consolidative immunotherapy Imfinzi finished 26 cycles  hypothyroid Recent thoracentesis 06/08/2023 showed chronic inflammation no malignant cells-previous recent/bronchoscopy 05/2023 secondary to right lung collapse  Brought in by EMS 08/02/2023 with a diarrheal illness sudden weakness intermittent bradycardia down to 30s in A-fib sluggish EKG was concerning for complete heart block and patient was found to have significant hyperkalemia potassium >7.5--this was treated with calcium gluconate insulin D50 and bicarb bicarb drip was started in ED Rest of labs show BUN/creatinine 50/1.8 WBC 6.4 hemoglobin 11.0 platelet 187 CXR showed layering right pleural effusion with associated airspace disease 12/19 transferred to [facility]     Plan  Junctional rhythm in setting of severe hyperkalemia Hyperkalemia is resolved and junctional rhythm also is better-caution with ARB-monitor trends on telemetry today ensure no further recurrences and check labs in the morning If stabilized probably can discharge home  AKI secondary to ARB from PTA Would hold losartan at this time 25 mg given slight rise in creatinine and still slightly elevated above normal potassium Check labs as above in a.m. 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 She has some cough but I am not overly concerned about pneumonia She has trapped lung on the right side with recurrent effusion and recent  thoracentesis in October so there is low yield from my perspective tapping this again If she continues to do well and is not short of breath we can probably let her discharge in the morning  HTN HLD Cautious resumption of losartan 25 continue labetalol 50 twice daily Hydralazine for blood pressures systolic >160  DM TY 2 CBGs are relatively well-controlled between the 60s and 190s-she dropped her blood sugar overnight so would not place her on any standing coverage PTA patient was on Farxiga 10 which has been held and Lantus 30 units which is also been held Outpatient follow-up  Hypothyroid Continue Synthroid 75 daily  CKD 3 Watch kidney function carefully-her baseline is in the 1.3 range   DVT prophylaxis: Heparin  Status is: Inpatient Remains inpatient appropriate because: Requires further inpatient management and care      Subjective: Awake coherent alert no distress  Objective + exam Vitals:   08/03/23 2005 08/03/23 2248 08/04/23 0024 08/04/23 0430  BP: (!) 150/83 (!) 130/107  (!) 143/40  Pulse: 88 78  79  Resp: 14 20  18   Temp:  98.1 F (36.7 C)  98.3 F (36.8 C)  TempSrc:  Oral  Oral  SpO2: 96% 99%  97%  Weight:   91.2 kg   Height:   5\' 5"  (1.651 m)    Filed Weights   08/04/23 0024  Weight: 91.2 kg    Examination:  EOMI NCAT no focal deficit no icterus no pallor no wheeze no rales no rhonchi Chest is clear anteriorly she has decreased air entry posterolaterally on the right side S1-S2 no murmur no rub no gallop-on monitors she has PVCs and NSVT but I do not see any junctional rhythm or  bradycardia No lower extremity edema   Data Reviewed: reviewed   CBC    Component Value Date/Time   WBC 4.7 08/03/2023 0411   RBC 3.50 (L) 08/03/2023 0411   HGB 9.4 (L) 08/03/2023 0411   HGB 10.4 (L) 05/03/2023 1023   HGB 8.1 (L) 12/27/2017 1123   HCT 30.5 (L) 08/03/2023 0411   HCT 24.4 (L) 12/27/2017 1123   PLT 170 08/03/2023 0411   PLT 214 05/03/2023 1023    PLT 296 12/27/2017 1123   MCV 87.1 08/03/2023 0411   MCV 70 (L) 12/27/2017 1123   MCH 26.9 08/03/2023 0411   MCHC 30.8 08/03/2023 0411   RDW 15.6 (H) 08/03/2023 0411   RDW 17.0 (H) 12/27/2017 1123   LYMPHSABS 0.5 (L) 08/02/2023 1646   LYMPHSABS 0.8 12/27/2017 1123   MONOABS 0.3 08/02/2023 1646   EOSABS 0.0 08/02/2023 1646   EOSABS 0.1 12/27/2017 1123   BASOSABS 0.0 08/02/2023 1646   BASOSABS 0.0 12/27/2017 1123      Latest Ref Rng & Units 08/04/2023    7:13 AM 08/03/2023   11:13 PM 08/03/2023    6:07 PM  CMP  Glucose 70 - 99 mg/dL  78    BUN 8 - 23 mg/dL  38    Creatinine 0.86 - 1.00 mg/dL  5.78    Sodium 469 - 629 mmol/L  141    Potassium 3.5 - 5.1 mmol/L 4.6  4.7  4.5   Chloride 98 - 111 mmol/L  111    CO2 22 - 32 mmol/L  24    Calcium 8.9 - 10.3 mg/dL  8.4    Total Protein 6.5 - 8.1 g/dL  6.8    Total Bilirubin <1.2 mg/dL  0.5    Alkaline Phos 38 - 126 U/L  122    AST 15 - 41 U/L  165    ALT 0 - 44 U/L  243       Scheduled Meds:  Chlorhexidine Gluconate Cloth  6 each Topical Daily   fluticasone furoate-vilanterol  1 puff Inhalation Daily   heparin  5,000 Units Subcutaneous Q8H   insulin aspart  0-15 Units Subcutaneous TID WC   insulin aspart  0-5 Units Subcutaneous QHS   levothyroxine  75 mcg Oral Q0600   multivitamin with minerals  1 tablet Oral Daily   Continuous Infusions:  Time  42  Rhetta Mura, MD  [facility]

## 2023-08-04 NOTE — Progress Notes (Signed)
Mobility Specialist Progress Note:    08/04/23 0907  Orthostatic Lying   BP- Lying 163/54 ((86))  Pulse- Lying 86  Orthostatic Sitting  BP- Sitting 171/72 ((95))  Pulse- Sitting 88  Orthostatic Standing at 0 minutes  BP- Standing at 0 minutes (!) 175/149 ((159))  Pulse- Standing at 0 minutes 94  Mobility  Activity Stood at bedside;Dangled on edge of bed  Level of Assistance Minimal assist, patient does 75% or more  Assistive Device Front wheel walker  Activity Response Tolerated well  Mobility Referral Yes  Mobility visit 1 Mobility  Mobility Specialist Start Time (ACUTE ONLY) [id number]  Mobility Specialist Stop Time (ACUTE ONLY) 0855  Mobility Specialist Time Calculation (min) (ACUTE ONLY) 14 min   Pt received in bed, reported feeling "swimmy headed." Required MinA to stand, RW for stability. Pt agreeable to orthostatics to check BP. Reported BP and HR, see above. Returned pt to supine, final BP 180/58 (86), HR 87 bpm. Notified RN. Left pt with all needs met.  Feliciana Rossetti Mobility Specialist Please contact via Special educational needs teacher or  Rehab office at [phone]

## 2023-08-04 NOTE — Plan of Care (Signed)
  Problem: Education: Goal: Ability to describe self-care measures that may prevent or decrease complications (Diabetes Survival Skills Education) will improve Outcome: Progressing Goal: Individualized Educational Video(s) Outcome: Progressing   Problem: Coping: Goal: Ability to adjust to condition or change in health will improve Outcome: Progressing   Problem: Fluid Volume: Goal: Ability to maintain a balanced intake and output will improve Outcome: Progressing   Problem: Health Behavior/Discharge Planning: Goal: Ability to identify and utilize available resources and services will improve Outcome: Progressing Goal: Ability to manage health-related needs will improve Outcome: Progressing   Problem: Metabolic: Goal: Ability to maintain appropriate glucose levels will improve Outcome: Progressing   Problem: Nutritional: Goal: Maintenance of adequate nutrition will improve Outcome: Progressing Goal: Progress toward achieving an optimal weight will improve Outcome: Progressing   Problem: Skin Integrity: Goal: Risk for impaired skin integrity will decrease Outcome: Progressing   Problem: Tissue Perfusion: Goal: Adequacy of tissue perfusion will improve Outcome: Progressing   Problem: Education: Goal: Knowledge of General Education information will improve Description: Including pain rating scale, medication(s)/side effects and non-pharmacologic comfort measures Outcome: Progressing   Problem: Health Behavior/Discharge Planning: Goal: Ability to manage health-related needs will improve Outcome: Progressing   Problem: Clinical Measurements: Goal: Ability to maintain clinical measurements within normal limits will improve Outcome: Progressing Goal: Will remain free from infection Outcome: Progressing Goal: Diagnostic test results will improve Outcome: Progressing Goal: Respiratory complications will improve Outcome: Progressing Goal: Cardiovascular complication will  be avoided Outcome: Progressing   Problem: Activity: Goal: Risk for activity intolerance will decrease Outcome: Progressing   Problem: Nutrition: Goal: Adequate nutrition will be maintained Outcome: Progressing   Problem: Coping: Goal: Level of anxiety will decrease Outcome: Progressing   Problem: Elimination: Goal: Will not experience complications related to bowel motility Outcome: Progressing Goal: Will not experience complications related to urinary retention Outcome: Progressing   Problem: Pain Management: Goal: General experience of comfort will improve Outcome: Progressing   Problem: Safety: Goal: Ability to remain free from injury will improve Outcome: Progressing   Problem: Skin Integrity: Goal: Risk for impaired skin integrity will decrease Outcome: Progressing   Problem: Education: Goal: Ability to describe self-care measures that may prevent or decrease complications (Diabetes Survival Skills Education) will improve Outcome: Progressing Goal: Individualized Educational Video(s) Outcome: Progressing   Problem: Coping: Goal: Ability to adjust to condition or change in health will improve Outcome: Progressing   Problem: Fluid Volume: Goal: Ability to maintain a balanced intake and output will improve Outcome: Progressing   Problem: Health Behavior/Discharge Planning: Goal: Ability to identify and utilize available resources and services will improve Outcome: Progressing Goal: Ability to manage health-related needs will improve Outcome: Progressing   Problem: Metabolic: Goal: Ability to maintain appropriate glucose levels will improve Outcome: Progressing   Problem: Nutritional: Goal: Maintenance of adequate nutrition will improve Outcome: Progressing Goal: Progress toward achieving an optimal weight will improve Outcome: Progressing   Problem: Skin Integrity: Goal: Risk for impaired skin integrity will decrease Outcome: Progressing    Problem: Tissue Perfusion: Goal: Adequacy of tissue perfusion will improve Outcome: Progressing

## 2023-08-04 NOTE — Inpatient Diabetes Management (Signed)
Inpatient Diabetes Program Recommendations  AACE/ADA: New Consensus Statement on Inpatient Glycemic Control (2015)  Target Ranges:  Prepandial:   less than 140 mg/dL      Peak postprandial:   less than 180 mg/dL (1-2 hours)      Critically ill patients:  140 - 180 mg/dL   Lab Results  Component Value Date   GLUCAP 194 (H) 08/04/2023   HGBA1C 6.3 (A) 05/18/2023    Latest Reference Range & Units 08/03/23 15:09 08/03/23 20:05 08/03/23 22:47 08/04/23 08:37  Glucose-Capillary 70 - 99 mg/dL 782 (H) 956 (H) Novolog 1 unit  65 (L) 194 (H)  (H): Data is abnormally high (L): Data is abnormally low Review of Glycemic Control  Latest Reference Range & Units 08/03/23 23:13  GFR, Estimated >60 mL/min 31 (L)  (L): Data is abnormally low  Diabetes history: DM2 Outpatient Diabetes medications: Farxiga 10 mg every day, Lantus 30 units QD Current orders for Inpatient glycemic control: Novolog 0-15 units TID and 0-5 units QHS  Inpatient Diabetes Program Recommendations:    Hypoglycemia last night after 1 unit of correction.    Might consider:  Novolog 0-6 units TID  Will continue to follow while inpatient.  Thank you, Dulce Sellar, MSN, CDCES Diabetes Coordinator Inpatient Diabetes Program [phone] (team pager from 8a-5p)

## 2023-08-05 DIAGNOSIS — E875 Hyperkalemia: Secondary | ICD-10-CM | POA: Diagnosis not present

## 2023-08-05 LAB — BASIC METABOLIC PANEL
Anion gap: 11 (ref 5–15)
BUN: 32 mg/dL — ABNORMAL HIGH (ref 8–23)
CO2: 17 mmol/L — ABNORMAL LOW (ref 22–32)
Calcium: 8.3 mg/dL — ABNORMAL LOW (ref 8.9–10.3)
Chloride: 109 mmol/L (ref 98–111)
Creatinine, Ser: 1.77 mg/dL — ABNORMAL HIGH (ref 0.44–1.00)
GFR, Estimated: 29 mL/min — ABNORMAL LOW (ref >60)
Glucose, Bld: 105 mg/dL — ABNORMAL HIGH (ref 70–99)
Potassium: 4.6 mmol/L (ref 3.5–5.1)
Sodium: 137 mmol/L (ref 135–145)

## 2023-08-05 LAB — GLUCOSE, CAPILLARY
Glucose-Capillary: 124 mg/dL — ABNORMAL HIGH (ref 70–99)
Glucose-Capillary: 210 mg/dL — ABNORMAL HIGH (ref 70–99)

## 2023-08-05 LAB — POTASSIUM: Potassium: 4.6 mmol/L (ref 3.5–5.1)

## 2023-08-05 MED ORDER — METOPROLOL SUCCINATE ER 25 MG PO TB24: 12.5000 mg | ORAL_TABLET | Freq: Every day | ORAL | 11 refills | Status: DC

## 2023-08-05 NOTE — Discharge Summary (Signed)
Physician Discharge Summary  Natale Melgarejo GEX:[id number] DOB: [birth year] DOA: 08/02/2023  PCP: [name], MD  Admit date: 08/02/2023 Discharge date: 08/05/2023  Time spent: 46 minutes  Recommendations for Outpatient Follow-up:  Avoid ACE ARB in the future-get Chem-12 in about 1 week New medication metoprolol XL 12.5-titrate upwards as needed Needs patient follow-up with Dr. Shirline Frees  Discharge Diagnoses:  MAIN problem for hospitalization   Junctional rhythm secondary to hypokalemia AKI presumed from ARB and volume depletion  Please see below for itemized issues addressed in HOpsital- refer to other progress notes for clarity if needed  Discharge Condition: Improved  Diet recommendation: Heart healthy diabetic  Filed Weights   08/04/23 0024 08/05/23 0456  Weight: 91.2 kg 90.4 kg    History of present illness:  77 year old home dwelling female DM TY 2 follows with endocrinology Dr. Christell Constant  HTN CKD 3 with chronic anemia NSCLC with right pleural effusion 2019--follows with Dr. Agustin Cree carboplatin paclitaxel 5 cycles-consolidative immunotherapy Imfinzi finished 26 cycles  hypothyroid Recent thoracentesis 06/08/2023 showed chronic inflammation no malignant cells-previous recent/bronchoscopy 05/2023 secondary to right lung collapse   Brought in by EMS 08/02/2023 with a diarrheal illness sudden weakness intermittent bradycardia down to 30s in A-fib sluggish EKG was concerning for complete heart block and patient was found to have significant hyperkalemia potassium >7.5--this was treated with calcium gluconate insulin D50 and bicarb bicarb drip was started in ED Rest of labs show BUN/creatinine 50/1.8 WBC 6.4 hemoglobin 11.0 platelet 187 CXR showed layering right pleural effusion with associated airspace disease 12/19 [facility] to [facility]         Plan   Junctional rhythm in setting of severe hyperkalemia Hyperkalemia is  resolved and junctional rhythm also is better-ARB was completely stopped and potassium normalized during hospital stay If stabilized probably can discharge home   AKI secondary to ARB from PTA Would hold losartan at this time 25 mg as she had AKI Would not resume the same in the outpatient setting and will need labs in a week   History 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 She has some cough but I am not overly concerned about pneumonia She has trapped lung on the right side with recurrent effusion and recent thoracentesis in October so there is low yield from my perspective tapping this again  she had no complaints overnight and was ambulatory  HTN HLD  we adjusted her beta-blocker to metoprolol XL 12.5 and discontinued losartan--she may need titration in the outpatient setting with her blood pressure meds  DM TY 2 CBGs are relatively well-controlled between the 60s and 190s-she dropped her blood sugar overnight so would not place her on any standing coverage  resume PTA Farxiga 10 and Lantus 34 units   Hypothyroid Continue Synthroid 75 daily   CKD 3 Watch kidney function carefully- check labs as an outpatient   Discharge Exam: Vitals:   08/05/23 0619 08/05/23 0800  BP: (!) 163/51 (!) 151/41  Pulse: 86 92  Resp:  16  Temp:  98.6 F (37 C)  SpO2:  97%    Subj on day of d/c   Awake coherent nad no focal deficit  General Exam on discharge   awake coherent pleasant no distress was little dizzy earlier but walked around the entire unit 150 Chest is clear no wheeze rales rhonchi S1-S2 no murmur Abdomen soft No lower extremity edema  Discharge Instructions   Discharge Instructions     Diet - low sodium  heart healthy   Complete by: As directed    Discharge instructions   Complete by: As directed    Do not take any further losartan and instead take just the Toprol-XL which is a new medication for you and is long-lasting  blood pressure medication-you will need a blood pressure check in the outpatient setting with your regular physician and they will increase the dose if they feel it is necessary Please get labs in a week at your primary physician's office Please make sure that you take your time walking around and monitor yourself for dizziness  Good luck-year-old X   Increase activity slowly   Complete by: As directed       Allergies as of 08/05/2023       Reactions   Paclitaxel Other (See Comments)   Unresponsiveness shortly after Taxol inf started 06/12/18.   Aspirin Other (See Comments)   Stomach bleeding    Esomeprazole Magnesium    UNSPECIFIED REACTION    Ace Inhibitors Other (See Comments)   Dizziness, drunk like        Medication List     STOP taking these medications    labetalol 100 MG tablet Commonly known as: NORMODYNE   losartan 50 MG tablet Commonly known as: COZAAR       TAKE these medications    Accu-Chek Softclix Lancets lancets Use as instructed   albuterol 108 (90 Base) MCG/ACT inhaler Commonly known as: VENTOLIN HFA Inhale 1-2 puffs into the lungs every 6 (six) hours as needed for wheezing or shortness of breath.   blood glucose meter kit and supplies Dispense based on patient and insurance preference. Use up to four times daily as directed. (FOR ICD-10 E10.9, E11.9).   blood glucose meter kit and supplies Kit Use up to four times daily as directed/accu check guide kit   Blood Glucose Test Strips 333 Strp 1 strip by In Vitro route 4 (four) times daily.   Dexcom G7 Sensor Misc 1 Device by Does not apply route continuous.   diphenhydrAMINE 2 % cream Commonly known as: BENADRYL Apply 1 application topically as needed for itching.   Farxiga 10 MG Tabs tablet Generic drug: dapagliflozin propanediol Take 1 tablet (10 mg total) by mouth daily before breakfast. Follow-up appt due in Nov must see provider for future refills   fluticasone  furoate-vilanterol 200-25 MCG/ACT Aepb Commonly known as: Breo Ellipta Inhale 1 puff into the lungs daily.   Insulin Pen Needle 32G X 4 MM Misc Inject 1 Needle into the skin every morning.   Iron 325 (65 Fe) MG Tabs Take 1 tablet (325 mg total) by mouth 2 (two) times daily with a meal.   Lantus SoloStar 100 UNIT/ML Solostar Pen Generic drug: insulin glargine Inject 34 Units into the skin daily. And 31G, 5mm pen needles 1/day What changed: how much to take   levothyroxine 75 MCG tablet Commonly known as: SYNTHROID Take 1 tablet by mouth once daily   meclizine 25 MG tablet Commonly known as: ANTIVERT Take 1 tablet (25 mg total) by mouth 3 (three) times daily as needed for dizziness.   metoprolol succinate 25 MG 24 hr tablet Commonly known as: Toprol XL Take 0.5 tablets (12.5 mg total) by mouth daily.   multivitamin capsule Take 1 capsule by mouth daily.   pravastatin 20 MG tablet Commonly known as: PRAVACHOL Take 1 tablet (20 mg total) by mouth daily.       Allergies  Allergen Reactions   Paclitaxel Other (See  Comments)    Unresponsiveness shortly after Taxol inf started 06/12/18.   Aspirin Other (See Comments)    Stomach bleeding    Esomeprazole Magnesium     UNSPECIFIED REACTION    Ace Inhibitors Other (See Comments)    Dizziness, drunk like      The results of significant diagnostics from this hospitalization (including imaging, microbiology, ancillary and laboratory) are listed below for reference.    Significant Diagnostic Studies: US Abdomen Limited RUQ (LIVER/GB) Result Date: 08/02/2023 CLINICAL DATA:  Elevated liver function tests EXAM: ULTRASOUND ABDOMEN LIMITED RIGHT UPPER QUADRANT COMPARISON:  None Available. FINDINGS: Gallbladder: Absent Common bile duct: Diameter: 2-3 mm in proximal diameter Liver: No focal lesion identified. Within normal limits in parenchymal echogenicity. Portal vein is patent on color Doppler imaging with normal direction of  blood flow towards the liver. Other: Moderate right pleural effusion IMPRESSION: 1. Status post cholecystectomy. 2. Moderate right pleural effusion. Electronically Signed   By: Helyn Numbers M.D.   On: 08/02/2023 21:52   US RENAL Result Date: 08/02/2023 CLINICAL DATA:  Acute renal insufficiency EXAM: RENAL / URINARY TRACT ULTRASOUND COMPLETE COMPARISON:  PET-CT 04/28/2018 FINDINGS: Right Kidney: Renal measurements: 9.9 x 4.3 x 5.1 cm = volume: 114 mL. Echogenicity within normal limits. No mass or hydronephrosis visualized. Left Kidney: Renal measurements: 9.9 x 5.0 x 4.3 cm = volume: 111 mL. Echogenicity within normal limits. No mass or hydronephrosis visualized. Bladder: Appears normal for degree of bladder distention. Other: Moderate right pleural effusion noted IMPRESSION: 1. Normal renal sonogram. 2. Moderate right pleural effusion. Electronically Signed   By: Helyn Numbers M.D.   On: 08/02/2023 21:51   DG Chest Port 1 View Result Date: 08/02/2023 CLINICAL DATA:  Bradycardia, concern for heart block EXAM: PORTABLE CHEST 1 VIEW COMPARISON:  06/08/2023 FINDINGS: Similar right perihilar opacity. Increased layering right pleural effusion and associated airspace opacities compared to 06/08/2023. The left lung is clear. No pneumothorax. Stable cardiomediastinal silhouette. Defibrillator pads. IMPRESSION: Increased layering right pleural effusion and associated airspace opacities compared to 06/08/2023. Pneumonia is difficult to exclude. Similar right perihilar opacity. Electronically Signed   By: Minerva Fester M.D.   On: 08/02/2023 18:59   MM 3D SCREENING MAMMOGRAM BILATERAL BREAST Result Date: 07/11/2023 CLINICAL DATA:  Screening. EXAM: DIGITAL SCREENING BILATERAL MAMMOGRAM WITH TOMOSYNTHESIS AND CAD TECHNIQUE: Bilateral screening digital craniocaudal and mediolateral oblique mammograms were obtained. Bilateral screening digital breast tomosynthesis was performed. The images were evaluated with  computer-aided detection. COMPARISON:  Previous exam(s). ACR Breast Density Category b: There are scattered areas of fibroglandular density. FINDINGS: There are no findings suspicious for malignancy. IMPRESSION: No mammographic evidence of malignancy. A result letter of this screening mammogram will be mailed directly to the patient. RECOMMENDATION: Screening mammogram in one year. (Code:SM-B-01Y) BI-RADS CATEGORY  1: Negative. Electronically Signed   By: Frederico Hamman M.D.   On: 07/11/2023 11:34    Microbiology: Recent Results (from the past 240 hours)  MRSA Next Gen by PCR, Nasal     Status: None   Collection Time: 08/02/23  9:10 PM   Specimen: Nasal Mucosa; Nasal Swab  Result Value Ref Range Status   MRSA by PCR Next Gen NOT DETECTED NOT DETECTED Final    Comment: (NOTE) The GeneXpert MRSA Assay (FDA approved for NASAL specimens only), is one component of a comprehensive MRSA colonization surveillance program. It is not intended to diagnose MRSA infection nor to guide or monitor treatment for MRSA infections. Test performance is not FDA approved in patients  less than 57 years old. Performed at [facility], [address] [place], [place] [address]      Labs: Basic Metabolic Panel: Recent Labs  Lab 08/02/23 1646 08/02/23 1932 08/02/23 2306 08/03/23 0411 08/03/23 1057 08/03/23 1312 08/03/23 1807 08/03/23 2313 08/04/23 0713 08/04/23 1119 08/04/23 1651 08/04/23 2312 08/05/23 0444  NA 134*  --   --  139  --  140  --  141  --   --   --   --  137  K >7.5*  --    < > 5.2*   < > 4.3   < > 4.7 4.6 5.0 4.6 4.9 4.6  4.6  CL 114*  --   --  113*  --  111  --  111  --   --   --   --  109  CO2 14*  --   --  19*  --  21*  --  24  --   --   --   --  17*  GLUCOSE 208* 190*  --  110*  --  102*  --  78  --   --   --   --  105*  BUN 50*  --   --  42*  --  35*  --  38*  --   --   --   --  32*  CREATININE 1.80*  --   --  1.55*  --  1.39*  --  1.70*  --   --   --   --  1.77*   CALCIUM 8.7*  --   --  8.3*  --  8.2*  --  8.4*  --   --   --   --  8.3*  MG  --   --   --  2.2  --   --   --   --   --   --   --   --   --   PHOS  --   --   --  3.8  --   --   --   --   --   --   --   --   --    < > = values in this interval not displayed.   Liver Function Tests: Recent Labs  Lab 08/02/23 1646 08/03/23 0411 08/03/23 2313  AST 406* 326* 165*  ALT 251* 312* 243*  ALKPHOS 142* 132* 122  BILITOT 0.5 0.5 0.5  PROT 7.7 6.5 6.8  ALBUMIN 3.5 2.9* 3.0*   No results for input(s): "LIPASE", "AMYLASE" in the last 168 hours. No results for input(s): "AMMONIA" in the last 168 hours. CBC: Recent Labs  Lab 08/02/23 1646 08/03/23 0411  WBC 6.4 4.7  NEUTROABS 5.6  --   HGB 11.0* 9.4*  HCT 37.9 30.5*  MCV 90.7 87.1  PLT 187 170   Cardiac Enzymes: No results for input(s): "CKTOTAL", "CKMB", "CKMBINDEX", "TROPONINI" in the last 168 hours. BNP: BNP (last 3 results) No results for input(s): "BNP" in the last 8760 hours.  ProBNP (last 3 results) No results for input(s): "PROBNP" in the last 8760 hours.  CBG: Recent Labs  Lab 08/04/23 1139 08/04/23 1625 08/04/23 2139 08/05/23 0540 08/05/23 0759  GLUCAP 113* 188* 112* 124* 210*       Signed:  Rhetta Mura MD   [facility] 08/05/2023, 10:48 AM

## 2023-08-05 NOTE — Care Management Important Message (Signed)
Important Message  Patient Details  Name: Jenna [name] MRN: [id number] Date of Birth: March 15, 1946   Important Message Given:  Yes - Medicare IM Patient left prior to IM delivery will send a copy to the patient home address.     Darlin Stenseth 08/05/2023, 3:23 PM

## 2023-08-05 NOTE — Progress Notes (Signed)
Mobility Specialist Progress Note:    08/05/23 1042  Mobility  Activity Ambulated with assistance in hallway  Level of Assistance Contact guard assist, steadying assist  Assistive Device Front wheel walker  Distance Ambulated (ft) 150 ft  Activity Response Tolerated well  Mobility Referral Yes  Mobility visit 1 Mobility  Mobility Specialist Start Time (ACUTE ONLY) 1030  Mobility Specialist Stop Time (ACUTE ONLY) 1042  Mobility Specialist Time Calculation (min) (ACUTE ONLY) 12 min   Pt received in bed, agreeable to mobility session. Ambulated in hallway with MinG via RW. Tolerated well, took 1 sitting rest break d/t fatigue, slight SOB throughout. Returned pt to room, left with all needs met.   Post Mobility: BP 181/56 (91), HR 87 bpm  Feliciana Rossetti Mobility Specialist Please contact via Special educational needs teacher or  Rehab office at [phone]

## 2023-08-08 ENCOUNTER — Telehealth: Payer: Self-pay | Admitting: *Deleted

## 2023-08-08 NOTE — Transitions of Care (Post Inpatient/ED Visit) (Signed)
08/08/2023  Name: Jenna [name] MRN: [id number] DOB: 02-13-1946  Today's TOC FU Call Status: Today's TOC FU Call Status:: Successful TOC FU Call Completed TOC FU Call Complete Date: 08/08/23 Patient's Name and Date of Birth confirmed.  Transition Care Management Follow-up Telephone Call Date of Discharge: 08/05/23 Discharge Facility: [facility]) Type of Discharge: Inpatient Admission Primary Inpatient Discharge Diagnosis:: junctional rhythm secondary to hyperkalemia, in setting of lung CA How have you been since you were released from the hospital?: Better ("I am doing just fine; not having any problems at all.  I have started taking the new medicine like they told me to.  I am completely independent and take care of myself") Any questions or concerns?: No  Items Reviewed: Did you receive and understand the discharge instructions provided?: Yes (thoroughly reviewed with patient who verbalizes good understanding of same) Medications obtained,verified, and reconciled?: Yes (Medications Reviewed) (Full medication reconciliation/ review completed; no concerns or discrepancies identified; confirmed patient obtained/ is taking all newly Rx'd medications as instructed; self-manages medications and denies questions/ concerns around medications today) Any new allergies since your discharge?: No Dietary orders reviewed?: Yes Type of Diet Ordered:: "As healthy as I can" Do you have support at home?: Yes People in Home: alone Name of Support/Comfort Primary Source: Reports resides alone "most of the time;" states her son occasionally stays with her at night; independent in self-care activities; supportive local family members assists as/ if needed/ indicated  Medications Reviewed Today: Medications Reviewed Today     Reviewed by Michaela Corner, RN (Registered Nurse) on 08/08/23 at 1232  Med List Status: <None>   Medication Order Taking? Sig Documenting Provider Last Dose Status  Informant  Accu-Chek Softclix Lancets lancets [id number] Yes Use as instructed Altamese Bobtown, MD Taking Active Self, Pharmacy Records  albuterol (VENTOLIN HFA) 108 (90 Base) MCG/ACT inhaler [id number] No Inhale 1-2 puffs into the lungs every 6 (six) hours as needed for wheezing or shortness of breath.  Patient not taking: Reported on 08/02/2023   Heilingoetter, Johnette Abraham, PA-C Not Taking Active Self, Pharmacy Records           Med Note Marilu Favre Aug 08, 2023 12:17 PM) 08/08/23: Reports during [facility] call she has not been taking recently-- reports she took initially when prescribed and then did not need after that  blood glucose meter kit and supplies [id number] Yes Dispense based on patient and insurance preference. Use up to four times daily as directed. (FOR ICD-10 E10.9, E11.9). Altamese Sedan, MD Taking Active Self, Pharmacy Records  blood glucose meter kit and supplies KIT [id number] Yes Use up to four times daily as directed/accu check guide kit Altamese Fulton, MD Taking Active Self, Pharmacy Records  Continuous Glucose Sensor (DEXCOM G7 SENSOR) MISC [id number] No 1 Device by Does not apply route continuous.  Patient not taking: Reported on 08/02/2023   Altamese Limestone, MD Not Taking Active Self, Pharmacy Records           Med Note Marilu Favre Aug 08, 2023 12:18 PM) 08/08/23: Reports during [facility] call she has not been using    diphenhydrAMINE (BENADRYL) 2 % cream [id number] Yes Apply 1 application topically as needed for itching. [provider] Taking Active Self, Pharmacy Records  FARXIGA 10 MG TABS tablet [id number] Yes Take 1 tablet (10 mg total) by mouth daily before breakfast. Follow-up appt due in Nov must see provider for future refills Altamese Hysham, MD Taking Active  Self, Pharmacy Records  Ferrous Sulfate (IRON) 325 (65 Fe) MG TABS [id number] No Take 1 tablet (325 mg total) by mouth 2 (two) times daily with a meal.  Patient not taking: Reported on 08/02/2023    Overton Mam, DO Not Taking Active Self, Pharmacy Records           Med Note Marilu Favre Aug 08, 2023 12:19 PM) 08/08/23: Reports during [facility] call she has not been taking- states has it at her home but does not take    fluticasone furoate-vilanterol (BREO ELLIPTA) 200-25 MCG/ACT AEPB [id number] No Inhale 1 puff into the lungs daily.  Patient not taking: Reported on 08/02/2023   [name], MD Not Taking Active Self, Pharmacy Records  Glucose Blood (BLOOD GLUCOSE TEST STRIPS 333) STRP [id number] Yes 1 strip by In Vitro route 4 (four) times daily. Altamese Munford, MD Taking Active Self, Pharmacy Records  insulin glargine (LANTUS SOLOSTAR) 100 UNIT/ML Solostar Pen [id number] Yes Inject 34 Units into the skin daily. And 31G, 5mm pen needles 1/day  Patient taking differently: Inject 30 Units into the skin daily. And 31G, 5mm pen needles 1/day   [name], MD Taking Active Self, Pharmacy Records           Med Note Marilu Favre Aug 08, 2023 12:20 PM) 08/08/23: Reports during [facility] call she is taking 30 U every day- morning    Insulin Pen Needle 32G X 4 MM MISC [id number] Yes Inject 1 Needle into the skin every morning. Altamese Porterdale, MD Taking Active Self, Pharmacy Records  levothyroxine (SYNTHROID) 75 MCG tablet [id number] Yes Take 1 tablet by mouth once daily [name], MD Taking Active Self, Pharmacy Records  meclizine (ANTIVERT) 25 MG tablet [id number] No Take 1 tablet (25 mg total) by mouth 3 (three) times daily as needed for dizziness.  Patient not taking: Reported on 08/08/2023   Dione Booze, MD Not Taking Active Self, Pharmacy Records  metoprolol succinate (TOPROL XL) 25 MG 24 hr tablet [id number] Yes Take 0.5 tablets (12.5 mg total) by mouth daily. Rhetta Mura, MD Taking Active   Multiple Vitamin (MULTIVITAMIN) capsule [id number] Yes Take 1 capsule by mouth daily. [provider] Taking Active Self, Pharmacy Records   pravastatin (PRAVACHOL) 20 MG tablet [id number] Yes Take 1 tablet (20 mg total) by mouth daily. [name], MD Taking Active Self, Pharmacy Records            Home Care and Equipment/Supplies: Were Home Health Services Ordered?: No Any new equipment or medical supplies ordered?: No  Functional Questionnaire: Do you need assistance with bathing/showering or dressing?: No Do you need assistance with meal preparation?: No Do you need assistance with eating?: No Do you have difficulty maintaining continence: No Do you need assistance with getting out of bed/getting out of a chair/moving?: No Do you have difficulty managing or taking your medications?: No  Follow up appointments reviewed: PCP Follow-up appointment confirmed?: Yes (care coordination outreach in real-time with scheduling care guide to successfully schedule hospital follow up PCP appointment 08/18/23- verified with care guide this is earlies available appointment) Date of PCP follow-up appointment?: 08/17/22 Follow-up Provider: PCP- coverage provider/ APP Jiles Prows NP Specialist Hospital Follow-up appointment confirmed?: Yes Date of Specialist follow-up appointment?: 08/24/22 Follow-Up Specialty Provider:: pulmonary provider Do you need transportation to your follow-up appointment?: No Do you understand care options if your condition(s) worsen?: Yes-patient verbalized understanding  SDOH Interventions Today  Flowsheet Row Most Recent Value  SDOH Interventions   Housing Interventions Intervention Not Indicated  Transportation Interventions Intervention Not Indicated  [Reports uses transportation- established with "social security, " reports family assists as/ if indicated,  denies need for additional transportation resources]  Utilities Interventions Intervention Not Indicated      Interventions Today    Flowsheet Row Most Recent Value  Chronic Disease   Chronic disease during today's visit Other   [junctional rhythm secondary to hyperkalemia]  General Interventions   General Interventions Discussed/Reviewed General Interventions Discussed, Durable Medical Equipment (DME)  Durable Medical Equipment (DME) Other  [confirmed not currently requiring/ using assistive devices for ambulation,  denies need for DME]  Nutrition Interventions   Nutrition Discussed/Reviewed Nutrition Discussed  Pharmacy Interventions   Pharmacy Dicussed/Reviewed Pharmacy Topics Discussed  Safety Interventions   Safety Discussed/Reviewed Safety Discussed       TOC Interventions Today    Flowsheet Row Most Recent Value  TOC Interventions   TOC Interventions Discussed/Reviewed TOC Interventions Discussed, Arranged PCP follow up less than 12 days/Care Guide scheduled        Caryl Pina, RN, BSN, Media planner  Transitions of Care  [facility] [phone]: direct office

## 2023-08-18 ENCOUNTER — Encounter: Payer: Self-pay | Admitting: Nurse Practitioner

## 2023-08-18 ENCOUNTER — Ambulatory Visit (INDEPENDENT_AMBULATORY_CARE_PROVIDER_SITE_OTHER): Payer: 59 | Admitting: Nurse Practitioner

## 2023-08-18 VITALS — BP 128/66 | HR 100 | Temp 98.3°F | Ht 65.0 in | Wt 189.4 lb

## 2023-08-18 DIAGNOSIS — C342 Malignant neoplasm of middle lobe, bronchus or lung: Secondary | ICD-10-CM | POA: Diagnosis not present

## 2023-08-18 DIAGNOSIS — E875 Hyperkalemia: Secondary | ICD-10-CM | POA: Diagnosis not present

## 2023-08-18 LAB — COMPREHENSIVE METABOLIC PANEL
ALT: 25 U/L (ref 0–35)
AST: 23 U/L (ref 0–37)
Albumin: 3.9 g/dL (ref 3.5–5.2)
Alkaline Phosphatase: 119 U/L — ABNORMAL HIGH (ref 39–117)
BUN: 34 mg/dL — ABNORMAL HIGH (ref 6–23)
CO2: 25 meq/L (ref 19–32)
Calcium: 8.9 mg/dL (ref 8.4–10.5)
Chloride: 105 meq/L (ref 96–112)
Creatinine, Ser: 1.53 mg/dL — ABNORMAL HIGH (ref 0.40–1.20)
GFR: 32.69 mL/min — ABNORMAL LOW (ref >60.00)
Glucose, Bld: 183 mg/dL — ABNORMAL HIGH (ref 70–99)
Potassium: 4.6 meq/L (ref 3.5–5.1)
Sodium: 138 meq/L (ref 135–145)
Total Bilirubin: 0.3 mg/dL (ref 0.2–1.2)
Total Protein: 8.1 g/dL (ref 6.0–8.3)

## 2023-08-18 MED ORDER — ALBUTEROL SULFATE HFA 108 (90 BASE) MCG/ACT IN AERS: 1.0000 | INHALATION_SPRAY | Freq: Four times a day (QID) | RESPIRATORY_TRACT | 2 refills | Status: DC | PRN

## 2023-08-18 MED ORDER — FLUTICASONE FUROATE-VILANTEROL 200-25 MCG/ACT IN AEPB: 1.0000 | INHALATION_SPRAY | Freq: Every day | RESPIRATORY_TRACT | 0 refills | Status: DC

## 2023-08-18 NOTE — Assessment & Plan Note (Signed)
 Chronic Follow-up with pulmonology as scheduled Will refill albuterol  inhaler and Breo inhaler.  Per chart review it does not appear that Breo inhaler has been discontinued by pulmonology.  Encouraged patient to discuss this further with pulmonologist at follow-up.

## 2023-08-18 NOTE — Patient Instructions (Signed)
 Ask Dr. Celine Mans if you should continue on the [facility] inhaler or if this was stopped.

## 2023-08-18 NOTE — Progress Notes (Addendum)
 Established Patient Office Visit  Subjective   Patient ID: Jenna [name], female    DOB: 03/10/1946  Age: 78 y.o. MRN: [id number]  Chief Complaint  Patient presents with   Hospitalization Follow-up    Patient arrives today for post-hospital visit.   She was hospitalized from  08/02/23-08/05/23.  She presented to the emergency department with diarrheal illness and sudden weakness.  Evaluation identified hyperkalemia, junctional heart rhythm with concern for complete heart block and AKI.  Past medical history significant for type 2 diabetes, hypertension CKD 3 with chronic anemia, NSCLC with chronic right pleural effusion, hypothyroidism.   Hyperkalemia treated with calcium  gluconate, insulin , D50, and bicarb in the emergency department with subsequent improvement.  Heart rhythm improved, with a repeat EKG identifying normal sinus rhythm.  Potassium ultimately improved as well and was 4.6 at time of discharge.  Her losartan  and labetalol  or discontinued due to AKI and hyperkalemia which was thought to be due to a combination of dehydration secondary to diarrhea and losartan .  She was on metoprolol  at discharge, she reports she is tolerating this well.    Per medication review, patient reports she needs a refill of her albuterol  inhaler and reports that she is no longer on Breo because refills ran out.    ROS: see HPI    Objective:     BP 128/66   Pulse 100   Temp 98.3 F (36.8 C) (Temporal)   Ht 5' 5 (1.651 m)   Wt 189 lb 6 oz (85.9 kg)   SpO2 95%   BMI 31.51 kg/m    Physical Exam Vitals reviewed.  Constitutional:      General: She is not in acute distress.    Appearance: Normal appearance.  HENT:     Head: Normocephalic and atraumatic.  Cardiovascular:     Rate and Rhythm: Normal rate and regular rhythm.     Pulses: Normal pulses.     Heart sounds: Normal heart sounds.  Pulmonary:     Effort: Pulmonary effort is normal.     Breath sounds: Normal breath  sounds.  Skin:    General: Skin is warm and dry.  Neurological:     General: No focal deficit present.     Mental Status: She is alert and oriented to person, place, and time.  Psychiatric:        Mood and Affect: Mood normal.        Behavior: Behavior normal.        Judgment: Judgment normal.      No results found for any visits on 08/18/23.    The 10-year ASCVD risk score (Arnett DK, et al., 2019) is: 23.3%    Assessment & Plan:   Problem List Items Addressed This Visit       Respiratory   Malignant neoplasm of middle lobe of right lung [facility])   Chronic Follow-up with pulmonology as scheduled Will refill albuterol  inhaler and Breo inhaler.  Per chart review it does not appear that Breo inhaler has been discontinued by pulmonology.  Encouraged patient to discuss this further with pulmonologist at follow-up.      Relevant Medications   albuterol  (VENTOLIN  HFA) 108 (90 Base) MCG/ACT inhaler   fluticasone  furoate-vilanterol (BREO ELLIPTA ) 200-25 MCG/ACT AEPB     Other   Hyperkalemia - Primary   Resolved prior to discharge. Patient remains feeling back to baseline. Check CMP, further recommendations may be made based on the results.  Blood pressure stable on metoprolol , continue this  and remain off the losartan .      Relevant Orders   Comprehensive metabolic panel    Return in about 1 month (around 09/18/2023) for with Dr. Rollene within the next month or so.    Lauraine FORBES Pereyra, NP

## 2023-08-18 NOTE — Assessment & Plan Note (Signed)
 Resolved prior to discharge. Patient remains feeling back to baseline. Check CMP, further recommendations may be made based on the results.  Blood pressure stable on metoprolol, continue this and remain off the losartan.

## 2023-08-25 ENCOUNTER — Encounter: Payer: Self-pay | Admitting: Internal Medicine

## 2023-08-25 ENCOUNTER — Ambulatory Visit (INDEPENDENT_AMBULATORY_CARE_PROVIDER_SITE_OTHER): Payer: 59 | Admitting: Internal Medicine

## 2023-08-25 VITALS — BP 169/58 | HR 87 | Ht 65.0 in | Wt 197.0 lb

## 2023-08-25 DIAGNOSIS — J90 Pleural effusion, not elsewhere classified: Secondary | ICD-10-CM | POA: Diagnosis not present

## 2023-08-25 NOTE — Patient Instructions (Addendum)
 It was a pleasure to see you today!  Please schedule follow up scheduled with myself in 3 months.  If my schedule is not open yet, we will contact you with a reminder closer to that time. Please call [phone] if you haven't heard from us  a month before, and always call us  sooner if issues or concerns arise. You can also send us  a message through MyChart, but but aware that this is not to be used for urgent issues and it may take up to 5-7 days to receive a reply. Please be aware that you will likely be able to view your results before I have a chance to respond to them. Please give us  5 business days to respond to any non-urgent results.    We will do your thoracentesis Monday January 13th at [facility]  Please arrive by 1:45 pm.

## 2023-08-25 NOTE — Progress Notes (Signed)
 Jenna [name]    [id number]    10-Oct-1945  Primary Care Physician:Crawford, Almarie LABOR, MD Date of Appointment: 08/25/2023 Established Patient Visit  Chief complaint:   Chief Complaint  Patient presents with   Follow-up    Pt was admitted 12/17 for heart, still feeling sob, wheezing and cough.      HPI: Jenna [name] is a 78 y.o. woman with h/o tobacco use disorder and mild emphysema. 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   Interval Updates: Here for follow up. Hospitalized December 2024 for AKI, hyperkalemia,brady-arrhythmia. She is still short of breath. She is having trouble laying flat at night - sleeps upright in recliner. She feels the effusion has recurred. She is open to pleurx if necessary.   Taking inhalers with minimal benefit.   No fevers chills  I have reviewed the patient's family social and past medical history and updated as appropriate.   Past Medical History:  Diagnosis Date   Blood transfusion without reported diagnosis    Cataract    DM (diabetes mellitus) (HCC)    Dyspnea    GERD (gastroesophageal reflux disease)    Hyperlipidemia    Hypertension    Iron  deficiency anemia    NSCL ca dx'd 03/2018   Pneumonia    Thyroid  disease     Past Surgical History:  Procedure Laterality Date   BIOPSY  12/09/2022   Procedure: BIOPSY;  Surgeon: Leigh Elspeth SQUIBB, MD;  Location: [facility];  Service: Gastroenterology;;   BRONCHIAL BIOPSY  04/05/2023   Procedure: BRONCHIAL BIOPSIES;  Surgeon: Kassie Acquanetta Bradley, MD;  Location: [facility];  Service: Cardiopulmonary;;   BRONCHIAL BRUSHINGS  04/05/2023   Procedure: BRONCHIAL BRUSHINGS;  Surgeon: Kassie Acquanetta Bradley, MD;  Location: [facility];  Service: Cardiopulmonary;;   BRONCHIAL WASHINGS  04/05/2023   Procedure: BRONCHIAL WASHINGS;  Surgeon: Kassie Acquanetta Bradley, MD;  Location: [facility];  Service: Cardiopulmonary;;   CATARACT EXTRACTION Right    CHOLECYSTECTOMY     COLONOSCOPY  10/24/2009   normal rectum/1X1cm abnormal lesion in the ascending colon (bx benign). TI normal for 10cm.  Prep difficult/inadequate. f/u TCS 09/2012 recommended   COLONOSCOPY  10/13/2004   Normal rectum/Diminutive polyps, splenic flexure, cold biopsied/removed.  Remainder of colonic mucosa appeared normal.   COLONOSCOPY N/A 12/14/2012   MFM:Rnonwpr polyp-tubular adenoma   COLONOSCOPY WITH PROPOFOL  N/A 12/09/2022   Procedure: COLONOSCOPY WITH PROPOFOL ;  Surgeon: Leigh Elspeth SQUIBB, MD;  Location: [facility];  Service: Gastroenterology;  Laterality: N/A;   ESOPHAGOGASTRODUODENOSCOPY  10/13/2004    Normal esophagus/ Nodular volcano like lesion in the antrum, either representing a  pancreatic rest or leiomyoma, biopsied.  Remainder of the gastric mucosa appeared normal, normal D1-D2   ESOPHAGOGASTRODUODENOSCOPY  10/24/2009   Benign biopsies. normal esophagus/small hiatal hernia/nodular lesion antrum/distal greater curvature. duodenal AVM s/p ablation   GIVENS CAPSULE STUDY  07/27/2010    multiple arteriovenous malformations which could definitely be the contributor to her drifting hemoglobin and hematocrit   HEMOSTASIS CONTROL  04/05/2023   Procedure: HEMOSTASIS CONTROL;  Surgeon: Kassie Acquanetta Bradley, MD;  Location: [facility];  Service: Cardiopulmonary;;   HOT HEMOSTASIS N/A 12/09/2022   Procedure: HOT HEMOSTASIS (ARGON PLASMA COAGULATION/BICAP);  Surgeon: Leigh Elspeth SQUIBB, MD;  Location: [facility];  Service: Gastroenterology;  Laterality: N/A;   IR THORACENTESIS ASP PLEURAL SPACE W/IMG GUIDE  12/16/2020  POLYPECTOMY  12/09/2022   Procedure: POLYPECTOMY;  Surgeon: Leigh Elspeth SQUIBB, MD;  Location: [facility];  Service: Gastroenterology;;   THORACENTESIS  04/05/2023   Procedure: THORACENTESIS;  Surgeon: Kassie Acquanetta Bradley, MD;  Location: [facility];  Service:  Cardiopulmonary;;   THORACENTESIS Right 06/08/2023   Procedure: MILANA;  Surgeon: Claudene Toribio BROCKS, MD;  Location: [facility];  Service: Pulmonary;  Laterality: Right;   TUBAL LIGATION     US  ECHOCARDIOGRAPHY     VIDEO BRONCHOSCOPY N/A 04/05/2023   Procedure: VIDEO BRONCHOSCOPY WITHOUT FLUORO;  Surgeon: Kassie Acquanetta Bradley, MD;  Location: [facility];  Service: Cardiopulmonary;  Laterality: N/A;   VIDEO BRONCHOSCOPY WITH ENDOBRONCHIAL ULTRASOUND N/A 04/27/2018   Procedure: VIDEO BRONCHOSCOPY WITH ENDOBRONCHIAL ULTRASOUND;  Surgeon: Kerrin Elspeth BROCKS, MD;  Location: [facility];  Service: Thoracic;  Laterality: N/A;    Family History  Problem Relation Age of Onset   Diabetes Sister    Colon cancer Maternal Aunt        greater than age 14   Breast cancer Cousin    Diabetes Daughter    Diabetes Daughter    Amblyopia Neg Hx    Blindness Neg Hx    Cataracts Neg Hx    Glaucoma Neg Hx    Macular degeneration Neg Hx    Retinal detachment Neg Hx    Strabismus Neg Hx    Retinitis pigmentosa Neg Hx    Rectal cancer Neg Hx    Stomach cancer Neg Hx    Colon polyps Neg Hx    Esophageal cancer Neg Hx     Social History   Occupational History   Occupation: retired  Tobacco Use   Smoking status: Former    Current packs/day: 0.00    Average packs/day: 0.5 packs/day for 55.0 years (27.5 ttl pk-yrs)    Types: Cigarettes    Start date: 03/19/1963    Quit date: 03/18/2018    Years since quitting: 5.4   Smokeless tobacco: Never   Tobacco comments:    smoked off and n  Vaping Use   Vaping status: Never Used  Substance and Sexual Activity   Alcohol  use: No   Drug use: No   Sexual activity: Not Currently     Physical Exam: Blood pressure (!) 169/58, pulse 87, height 5' 5 (1.651 m), weight 197 lb (89.4 kg), SpO2 95%.  Gen:      No distress Lungs:   breath sounds diminished right lung field, clear on left.  CV:         RRR   Data Reviewed: Imaging: Chest xray 12/17 shows right  pleural effusion with atelectasis.  PFTs:     Latest Ref Rng & Units 03/15/2022   10:43 AM 05/11/2018    2:33 PM  PFT Results  FVC-Pre L 1.23    FVC-Predicted Pre % 41  71   FVC-Post L 1.13  1.77   FVC-Predicted Post % 38  73   Pre FEV1/FVC % % 80  83   Post FEV1/FCV % % 80  82   FEV1-Pre L 0.98  1.44   FEV1-Predicted Pre % 44  76   FEV1-Post L 0.91  1.46   DLCO uncorrected ml/min/mmHg 7.37  10.35   DLCO UNC% % 37  40   DLCO corrected ml/min/mmHg 7.37  12.65   DLCO COR %Predicted % 37  49   DLVA Predicted % 64  79   TLC L 3.62  4.30   TLC % Predicted % 69  82   RV % Predicted % 88  104    I have personally reviewed the patient's PFTs and show severe restriction to ventilation with reduced diffusion capacity.   Labs:  Immunization status: Immunization History  Administered Date(s) Administered   Fluad Quad(high Dose 65+) 05/31/2019, 09/12/2020   Influenza-Unspecified 04/16/2014   PFIZER(Purple Top)SARS-COV-2 Vaccination 11/22/2019, 12/17/2019   Pneumococcal Conjugate-13 06/10/2014   Pneumococcal Polysaccharide-23 04/19/2013    External Records Personally Reviewed: oncology, primary care.   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 Now worsened in the setting of right lung collapse.  Emphysema, mild History of tobacco use, quit 2019 Recurrent right pleural effusion Chronic cough, likely reflux  Plan/Recommendations: Likely multifactorial dyspnea from chronic RLL collapse secondary to post - radiation changes, recurrent pleural effusion.   In the past with thoracentesis, right pleural effusion Cytology has already been negative x 3.  Bronchoscopy has been negative for endobronchial malignancy.   We discussed pleurx catheter in the past but she was hesitant and there was concern she wasn't having enough symptomatic benefit from there repeated  thoracentesis due to pneumothorax ex vacuo.   She wants to proceed with additional thoracentesis and see if she has benefit. If she does have symptomatic benefit she would be ok with indwelling pleural catheter placement.   Will schedule next week for inpatient thoracentesis.   Return to Care: Return in about 3 months (around 11/23/2023).   [name], MD Pulmonary and Critical Care Medicine [facility] [phone]

## 2023-08-25 NOTE — H&P (View-Only) (Signed)
 Jenna [name]    [id number]    10-Oct-1945  Primary Care Physician:Crawford, Almarie LABOR, MD Date of Appointment: 08/25/2023 Established Patient Visit  Chief complaint:   Chief Complaint  Patient presents with   Follow-up    Pt was admitted 12/17 for heart, still feeling sob, wheezing and cough.      HPI: Jenna [name] is a 78 y.o. woman with h/o tobacco use disorder and mild emphysema. 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   Interval Updates: Here for follow up. Hospitalized December 2024 for AKI, hyperkalemia,brady-arrhythmia. She is still short of breath. She is having trouble laying flat at night - sleeps upright in recliner. She feels the effusion has recurred. She is open to pleurx if necessary.   Taking inhalers with minimal benefit.   No fevers chills  I have reviewed the patient's family social and past medical history and updated as appropriate.   Past Medical History:  Diagnosis Date   Blood transfusion without reported diagnosis    Cataract    DM (diabetes mellitus) (HCC)    Dyspnea    GERD (gastroesophageal reflux disease)    Hyperlipidemia    Hypertension    Iron  deficiency anemia    NSCL ca dx'd 03/2018   Pneumonia    Thyroid  disease     Past Surgical History:  Procedure Laterality Date   BIOPSY  12/09/2022   Procedure: BIOPSY;  Surgeon: Leigh Elspeth SQUIBB, MD;  Location: [facility];  Service: Gastroenterology;;   BRONCHIAL BIOPSY  04/05/2023   Procedure: BRONCHIAL BIOPSIES;  Surgeon: Kassie Acquanetta Bradley, MD;  Location: [facility];  Service: Cardiopulmonary;;   BRONCHIAL BRUSHINGS  04/05/2023   Procedure: BRONCHIAL BRUSHINGS;  Surgeon: Kassie Acquanetta Bradley, MD;  Location: [facility];  Service: Cardiopulmonary;;   BRONCHIAL WASHINGS  04/05/2023   Procedure: BRONCHIAL WASHINGS;  Surgeon: Kassie Acquanetta Bradley, MD;  Location: [facility];  Service: Cardiopulmonary;;   CATARACT EXTRACTION Right    CHOLECYSTECTOMY     COLONOSCOPY  10/24/2009   normal rectum/1X1cm abnormal lesion in the ascending colon (bx benign). TI normal for 10cm.  Prep difficult/inadequate. f/u TCS 09/2012 recommended   COLONOSCOPY  10/13/2004   Normal rectum/Diminutive polyps, splenic flexure, cold biopsied/removed.  Remainder of colonic mucosa appeared normal.   COLONOSCOPY N/A 12/14/2012   MFM:Rnonwpr polyp-tubular adenoma   COLONOSCOPY WITH PROPOFOL  N/A 12/09/2022   Procedure: COLONOSCOPY WITH PROPOFOL ;  Surgeon: Leigh Elspeth SQUIBB, MD;  Location: [facility];  Service: Gastroenterology;  Laterality: N/A;   ESOPHAGOGASTRODUODENOSCOPY  10/13/2004    Normal esophagus/ Nodular volcano like lesion in the antrum, either representing a  pancreatic rest or leiomyoma, biopsied.  Remainder of the gastric mucosa appeared normal, normal D1-D2   ESOPHAGOGASTRODUODENOSCOPY  10/24/2009   Benign biopsies. normal esophagus/small hiatal hernia/nodular lesion antrum/distal greater curvature. duodenal AVM s/p ablation   GIVENS CAPSULE STUDY  07/27/2010    multiple arteriovenous malformations which could definitely be the contributor to her drifting hemoglobin and hematocrit   HEMOSTASIS CONTROL  04/05/2023   Procedure: HEMOSTASIS CONTROL;  Surgeon: Kassie Acquanetta Bradley, MD;  Location: [facility];  Service: Cardiopulmonary;;   HOT HEMOSTASIS N/A 12/09/2022   Procedure: HOT HEMOSTASIS (ARGON PLASMA COAGULATION/BICAP);  Surgeon: Leigh Elspeth SQUIBB, MD;  Location: [facility];  Service: Gastroenterology;  Laterality: N/A;   IR THORACENTESIS ASP PLEURAL SPACE W/IMG GUIDE  12/16/2020  POLYPECTOMY  12/09/2022   Procedure: POLYPECTOMY;  Surgeon: Leigh Elspeth SQUIBB, MD;  Location: [facility];  Service: Gastroenterology;;   THORACENTESIS  04/05/2023   Procedure: THORACENTESIS;  Surgeon: Kassie Acquanetta Bradley, MD;  Location: [facility];  Service:  Cardiopulmonary;;   THORACENTESIS Right 06/08/2023   Procedure: MILANA;  Surgeon: Claudene Toribio BROCKS, MD;  Location: [facility];  Service: Pulmonary;  Laterality: Right;   TUBAL LIGATION     US  ECHOCARDIOGRAPHY     VIDEO BRONCHOSCOPY N/A 04/05/2023   Procedure: VIDEO BRONCHOSCOPY WITHOUT FLUORO;  Surgeon: Kassie Acquanetta Bradley, MD;  Location: [facility];  Service: Cardiopulmonary;  Laterality: N/A;   VIDEO BRONCHOSCOPY WITH ENDOBRONCHIAL ULTRASOUND N/A 04/27/2018   Procedure: VIDEO BRONCHOSCOPY WITH ENDOBRONCHIAL ULTRASOUND;  Surgeon: Kerrin Elspeth BROCKS, MD;  Location: [facility];  Service: Thoracic;  Laterality: N/A;    Family History  Problem Relation Age of Onset   Diabetes Sister    Colon cancer Maternal Aunt        greater than age 14   Breast cancer Cousin    Diabetes Daughter    Diabetes Daughter    Amblyopia Neg Hx    Blindness Neg Hx    Cataracts Neg Hx    Glaucoma Neg Hx    Macular degeneration Neg Hx    Retinal detachment Neg Hx    Strabismus Neg Hx    Retinitis pigmentosa Neg Hx    Rectal cancer Neg Hx    Stomach cancer Neg Hx    Colon polyps Neg Hx    Esophageal cancer Neg Hx     Social History   Occupational History   Occupation: retired  Tobacco Use   Smoking status: Former    Current packs/day: 0.00    Average packs/day: 0.5 packs/day for 55.0 years (27.5 ttl pk-yrs)    Types: Cigarettes    Start date: 03/19/1963    Quit date: 03/18/2018    Years since quitting: 5.4   Smokeless tobacco: Never   Tobacco comments:    smoked off and n  Vaping Use   Vaping status: Never Used  Substance and Sexual Activity   Alcohol  use: No   Drug use: No   Sexual activity: Not Currently     Physical Exam: Blood pressure (!) 169/58, pulse 87, height 5' 5 (1.651 m), weight 197 lb (89.4 kg), SpO2 95%.  Gen:      No distress Lungs:   breath sounds diminished right lung field, clear on left.  CV:         RRR   Data Reviewed: Imaging: Chest xray 12/17 shows right  pleural effusion with atelectasis.  PFTs:     Latest Ref Rng & Units 03/15/2022   10:43 AM 05/11/2018    2:33 PM  PFT Results  FVC-Pre L 1.23    FVC-Predicted Pre % 41  71   FVC-Post L 1.13  1.77   FVC-Predicted Post % 38  73   Pre FEV1/FVC % % 80  83   Post FEV1/FCV % % 80  82   FEV1-Pre L 0.98  1.44   FEV1-Predicted Pre % 44  76   FEV1-Post L 0.91  1.46   DLCO uncorrected ml/min/mmHg 7.37  10.35   DLCO UNC% % 37  40   DLCO corrected ml/min/mmHg 7.37  12.65   DLCO COR %Predicted % 37  49   DLVA Predicted % 64  79   TLC L 3.62  4.30   TLC % Predicted % 69  82   RV % Predicted % 88  104    I have personally reviewed the patient's PFTs and show severe restriction to ventilation with reduced diffusion capacity.   Labs:  Immunization status: Immunization History  Administered Date(s) Administered   Fluad Quad(high Dose 65+) 05/31/2019, 09/12/2020   Influenza-Unspecified 04/16/2014   PFIZER(Purple Top)SARS-COV-2 Vaccination 11/22/2019, 12/17/2019   Pneumococcal Conjugate-13 06/10/2014   Pneumococcal Polysaccharide-23 04/19/2013    External Records Personally Reviewed: oncology, primary care.   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 Now worsened in the setting of right lung collapse.  Emphysema, mild History of tobacco use, quit 2019 Recurrent right pleural effusion Chronic cough, likely reflux  Plan/Recommendations: Likely multifactorial dyspnea from chronic RLL collapse secondary to post - radiation changes, recurrent pleural effusion.   In the past with thoracentesis, right pleural effusion Cytology has already been negative x 3.  Bronchoscopy has been negative for endobronchial malignancy.   We discussed pleurx catheter in the past but she was hesitant and there was concern she wasn't having enough symptomatic benefit from there repeated  thoracentesis due to pneumothorax ex vacuo.   She wants to proceed with additional thoracentesis and see if she has benefit. If she does have symptomatic benefit she would be ok with indwelling pleural catheter placement.   Will schedule next week for inpatient thoracentesis.   Return to Care: Return in about 3 months (around 11/23/2023).   [name], MD Pulmonary and Critical Care Medicine [facility] [phone]

## 2023-08-29 ENCOUNTER — Ambulatory Visit (HOSPITAL_COMMUNITY)
Admission: RE | Admit: 2023-08-29 | Discharge: 2023-08-29 | Disposition: A | Discharge status: Home / Self Care | Payer: 59 | Attending: Internal Medicine | Admitting: Internal Medicine

## 2023-08-29 ENCOUNTER — Encounter (HOSPITAL_COMMUNITY)
Admission: RE | Disposition: A | Discharge status: Home / Self Care | Payer: Self-pay | Source: Home / Self Care | Attending: Internal Medicine

## 2023-08-29 ENCOUNTER — Ambulatory Visit (HOSPITAL_COMMUNITY): Payer: 59

## 2023-08-29 DIAGNOSIS — Z9889 Other specified postprocedural states: Secondary | ICD-10-CM | POA: Diagnosis not present

## 2023-08-29 DIAGNOSIS — Z87891 Personal history of nicotine dependence: Secondary | ICD-10-CM | POA: Diagnosis not present

## 2023-08-29 DIAGNOSIS — Z923 Personal history of irradiation: Secondary | ICD-10-CM | POA: Insufficient documentation

## 2023-08-29 DIAGNOSIS — R846 Abnormal cytological findings in specimens from respiratory organs and thorax: Secondary | ICD-10-CM | POA: Diagnosis not present

## 2023-08-29 DIAGNOSIS — J90 Pleural effusion, not elsewhere classified: Secondary | ICD-10-CM | POA: Insufficient documentation

## 2023-08-29 DIAGNOSIS — J439 Emphysema, unspecified: Secondary | ICD-10-CM | POA: Diagnosis not present

## 2023-08-29 DIAGNOSIS — Z48813 Encounter for surgical aftercare following surgery on the respiratory system: Secondary | ICD-10-CM | POA: Diagnosis not present

## 2023-08-29 DIAGNOSIS — C3490 Malignant neoplasm of unspecified part of unspecified bronchus or lung: Secondary | ICD-10-CM | POA: Diagnosis not present

## 2023-08-29 DIAGNOSIS — Z9221 Personal history of antineoplastic chemotherapy: Secondary | ICD-10-CM | POA: Insufficient documentation

## 2023-08-29 HISTORY — PX: THORACENTESIS: SHX235

## 2023-08-29 LAB — BODY FLUID CELL COUNT WITH DIFFERENTIAL
Eos, Fluid: 0 %
Lymphs, Fluid: 84 %
Monocyte-Macrophage-Serous Fluid: 11 % — ABNORMAL LOW (ref 50–90)
Neutrophil Count, Fluid: 5 % (ref 0–25)
Total Nucleated Cell Count, Fluid: 475 uL (ref 0–1000)

## 2023-08-29 LAB — LACTATE DEHYDROGENASE, PLEURAL OR PERITONEAL FLUID: LD, Fluid: 104 U/L — ABNORMAL HIGH (ref 3–23)

## 2023-08-29 LAB — GLUCOSE, PLEURAL OR PERITONEAL FLUID: Glucose, Fluid: 113 mg/dL

## 2023-08-29 LAB — PROTEIN, PLEURAL OR PERITONEAL FLUID: Total protein, fluid: 4.2 g/dL

## 2023-08-29 SURGERY — THORACENTESIS
Laterality: Right

## 2023-08-29 NOTE — Interval H&P Note (Signed)
 History and Physical Interval Note:  08/29/2023 2:01 PM  Jenna [name]  has presented today for surgery, with the diagnosis of pleural effusion.  The various methods of treatment have been discussed with the patient and family. After consideration of risks, benefits and other options for treatment, the patient has consented to  Procedure(s): THORACENTESIS (Right) as a surgical intervention.  The patient's history has been reviewed, patient examined, no change in status, stable for surgery.  I have reviewed the patient's chart and labs.  Questions were answered to the patient's satisfaction.     Verdon GORMAN Gore

## 2023-08-29 NOTE — Op Note (Signed)
 Thoracentesis  Procedure Note  Jenna [name]  [id number]  10-21-1945  Date:08/29/23  Time:2:47 PM   Provider Performing:Jenna [name]   Procedure: Thoracentesis with imaging guidance ([id number])  Indication(s) Pleural Effusion  Consent Risks of the procedure as well as the alternatives and risks of each were explained to the patient and/or caregiver.  Consent for the procedure was obtained and is signed in the bedside chart  Anesthesia Topical only with 1% lidocaine     Time Out Verified patient identification, verified procedure, site/side was marked, verified correct patient position, special equipment/implants available, medications/allergies/relevant history reviewed, required imaging and test results available.   Sterile Technique Maximal sterile technique including full sterile barrier drape, hand hygiene, sterile gown, sterile gloves, mask, hair covering, sterile ultrasound probe cover (if used).  Procedure Description Ultrasound was used to identify appropriate pleural anatomy for placement and overlying skin marked.  Area of drainage cleaned and draped in sterile fashion. Lidocaine  was used to anesthetize the skin and subcutaneous tissue.  1500 cc's of serous appearing fluid was drained from the right pleural space. Catheter then removed and bandaid applied to site.  Pleural pressures were notably negative and she had significant pain during and after the procedure.    Complications/Tolerance None; patient tolerated the procedure well. Chest X-ray is ordered to confirm no post-procedural complication.   EBL Minimal   Specimen(s) Pleural fluid        Jenna Gore, MD Pulmonary and Critical Care Medicine [facility] 08/29/2023 2:48 PM Pager: see AMION  If no response to pager, please call critical care on call (see AMION) until 7pm After 7:00 pm call Elink

## 2023-08-29 NOTE — Discharge Instructions (Signed)
 Please check your blood pressure at home as it has been running high and you may need additional meds.

## 2023-08-31 LAB — CYTOLOGY - NON PAP

## 2023-09-01 ENCOUNTER — Encounter (HOSPITAL_COMMUNITY): Payer: Self-pay | Admitting: Internal Medicine

## 2023-09-02 LAB — BODY FLUID CULTURE W GRAM STAIN: Culture: NO GROWTH

## 2023-09-12 ENCOUNTER — Ambulatory Visit (INDEPENDENT_AMBULATORY_CARE_PROVIDER_SITE_OTHER): Payer: 59 | Admitting: "Endocrinology

## 2023-09-12 ENCOUNTER — Encounter: Payer: Self-pay | Admitting: "Endocrinology

## 2023-09-12 VITALS — BP 128/60 | HR 97 | Ht 65.0 in | Wt 195.4 lb

## 2023-09-12 DIAGNOSIS — Z794 Long term (current) use of insulin: Secondary | ICD-10-CM | POA: Diagnosis not present

## 2023-09-12 DIAGNOSIS — Z7984 Long term (current) use of oral hypoglycemic drugs: Secondary | ICD-10-CM | POA: Diagnosis not present

## 2023-09-12 DIAGNOSIS — E1122 Type 2 diabetes mellitus with diabetic chronic kidney disease: Secondary | ICD-10-CM

## 2023-09-12 DIAGNOSIS — E7800 Pure hypercholesterolemia, unspecified: Secondary | ICD-10-CM

## 2023-09-12 LAB — POCT GLYCOSYLATED HEMOGLOBIN (HGB A1C): Hemoglobin A1C: 6.1 % — AB (ref 4.0–5.6)

## 2023-09-12 NOTE — Patient Instructions (Signed)

## 2023-09-12 NOTE — Progress Notes (Signed)
Outpatient Endocrinology Note Jenna Brooks, MD  09/12/23   Jenna [name] 04/05/1946 [id number]  Referring Provider: Myrlene [name], * Primary Care Provider: [name], MD Reason for consultation: Subjective   Assessment & Plan  Diagnoses and all orders for this visit:  Type 2 diabetes mellitus with chronic kidney disease, with long-term current use of insulin, unspecified CKD stage (HCC) -     POCT glycosylated hemoglobin (Hb A1C) -     Comprehensive metabolic panel -     Lipid panel -     Microalbumin / creatinine urine ratio -     Hemoglobin A1c  Long term (current) use of oral hypoglycemic drugs  Long-term insulin use (HCC)  Pure hypercholesterolemia    Diabetes complicated by neuropathy  Hba1c goal less than 7.0, current Hba1c is 6.1%. Will recommend the following: Lantus 30 units qam Farxiga 10 mg qd  No known contraindications to any of above medications  Hyperlipidemia -Last LDL at goal: 74 -on pravastatin 20 mg QD -Follow low fat diet and exercise   -Blood pressure goal <140/90 - Microalbumin/creatinine off goal at 37.5 - not on ACE/ARB - taken off of Losartan 50 mg qd due to hyperkalemia requiring hospitalization  -diet changes including salt restriction -limit eating outside -counseled BP targets per standards of diabetes care -Uncontrolled blood pressure can lead to retinopathy, nephropathy and cardiovascular and atherosclerotic heart disease  Reviewed and counseled on: -A1C target -Blood sugar targets -Complications of uncontrolled diabetes  -Checking blood sugar before meals and bedtime and bring log next visit -All medications with mechanism of action and side effects -Hypoglycemia management: rule of 15's, Glucagon Emergency Kit and medical alert ID -low-carb low-fat plate-method diet -At least 20 minutes of physical activity per day -Annual dilated retinal eye exam and foot exam -compliance and follow up  needs -follow up as scheduled or earlier if problem gets worse  Call if blood sugar is less than 70 or consistently above 250    Take a 15 gm snack of carbohydrate at bedtime before you go to sleep if your blood sugar is less than 100.    If you are going to fast after midnight for a test or procedure, ask your physician for instructions on how to reduce/decrease your insulin dose.    Call if blood sugar is less than 70 or consistently above 250  -Treating a low sugar by rule of 15  (15 gms of sugar every 15 min until sugar is more than 70) If you feel your sugar is low, test your sugar to be sure If your sugar is low (less than 70), then take 15 grams of a fast acting Carbohydrate (3-4 glucose tablets or glucose gel or 4 ounces of juice or regular soda) Recheck your sugar 15 min after treating low to make sure it is more than 70 If sugar is still less than 70, treat again with 15 grams of carbohydrate          Don't drive the hour of hypoglycemia  If unconscious/unable to eat or drink by mouth, use glucagon injection or nasal spray baqsimi and call 911. Can repeat again in 15 min if still unconscious.  Return in about 3 months (around 12/11/2023) for visit and 8 am labs before next visit.   I have reviewed current medications, nurse's notes, allergies, vital signs, past medical and surgical history, family medical history, and social history for this encounter. Counseled patient on symptoms, examination findings, lab findings, imaging  results, treatment decisions and monitoring and prognosis. The patient understood the recommendations and agrees with the treatment plan. All questions regarding treatment plan were fully answered.  Jenna Killen, MD  09/12/23   History of Present Illness Jenna [name] is a 78 y.o. year old female who presents for follow up for Type 2 diabetes mellitus.  Jenna [name] was first diagnosed in 2000.   Diabetes education +  Home diabetes  regimen: Lantus 30 units qam Farxiga 10 mg qd  COMPLICATIONS -  MI/Stroke, + PAD -  retinopathy -  neuropathy +  nephropathy, Cr 1.5  BLOOD SUGAR DATA No dexcom on Can't remember the last time she checked   Physical Exam  BP 128/60   Pulse 97   Ht 5\' 5"  (1.651 m)   Wt 195 lb 6.4 oz (88.6 kg)   SpO2 90%   BMI 32.52 kg/m    Constitutional: well developed, well nourished Head: normocephalic, atraumatic Eyes: sclera anicteric, no redness Neck: supple Lungs: normal respiratory effort Neurology: alert and oriented Skin: dry, no appreciable rashes Musculoskeletal: no appreciable defects Psychiatric: normal mood and affect  Current Medications Patient's Medications  New Prescriptions   No medications on file  Previous Medications   ACCU-CHEK SOFTCLIX LANCETS LANCETS    Use as instructed   ALBUTEROL (VENTOLIN HFA) 108 (90 BASE) MCG/ACT INHALER    Inhale 1-2 puffs into the lungs every 6 (six) hours as needed for wheezing or shortness of breath.   BLOOD GLUCOSE METER KIT AND SUPPLIES    Dispense based on patient and insurance preference. Use up to four times daily as directed. (FOR ICD-10 E10.9, E11.9).   BLOOD GLUCOSE METER KIT AND SUPPLIES KIT    Use up to four times daily as directed/accu check guide kit   CONTINUOUS GLUCOSE SENSOR (DEXCOM G7 SENSOR) MISC    1 Device by Does not apply route continuous.   DIPHENHYDRAMINE (BENADRYL) 2 % CREAM    Apply 1 application topically as needed for itching.   FARXIGA 10 MG TABS TABLET    Take 1 tablet (10 mg total) by mouth daily before breakfast. Follow-up appt due in Nov must see provider for future refills   FERROUS SULFATE (IRON) 325 (65 FE) MG TABS    Take 1 tablet (325 mg total) by mouth 2 (two) times daily with a meal.   FLUTICASONE FUROATE-VILANTEROL (BREO ELLIPTA) 200-25 MCG/ACT AEPB    Inhale 1 puff into the lungs daily.   GLUCOSE BLOOD (BLOOD GLUCOSE TEST STRIPS 333) STRP    1 strip by In Vitro route 4 (four) times daily.    INSULIN GLARGINE (LANTUS SOLOSTAR) 100 UNIT/ML SOLOSTAR PEN    Inject 34 Units into the skin daily. And 31G, 5mm pen needles 1/day   INSULIN PEN NEEDLE 32G X 4 MM MISC    Inject 1 Needle into the skin every morning.   LEVOTHYROXINE (SYNTHROID) 75 MCG TABLET    Take 1 tablet by mouth once daily   MECLIZINE (ANTIVERT) 25 MG TABLET    Take 1 tablet (25 mg total) by mouth 3 (three) times daily as needed for dizziness.   METOPROLOL SUCCINATE (TOPROL XL) 25 MG 24 HR TABLET    Take 0.5 tablets (12.5 mg total) by mouth daily.   MULTIPLE VITAMIN (MULTIVITAMIN) CAPSULE    Take 1 capsule by mouth daily.   PRAVASTATIN (PRAVACHOL) 20 MG TABLET    Take 1 tablet (20 mg total) by mouth daily.  Modified Medications   No  medications on file  Discontinued Medications   No medications on file    Allergies Allergies  Allergen Reactions   Paclitaxel Other (See Comments)    Unresponsiveness shortly after Taxol inf started 06/12/18.   Aspirin Other (See Comments)    Stomach bleeding    Esomeprazole Magnesium     UNSPECIFIED REACTION    Losartan     AKI, hyperkalemia   Ace Inhibitors Other (See Comments)    Dizziness, drunk like    Past Medical History Past Medical History:  Diagnosis Date   Blood transfusion without reported diagnosis    Cataract    DM (diabetes mellitus) (HCC)    Dyspnea    GERD (gastroesophageal reflux disease)    Hyperlipidemia    Hypertension    Iron deficiency anemia    NSCL ca dx'd 03/2018   Pneumonia    Thyroid disease     Past Surgical History Past Surgical History:  Procedure Laterality Date   BIOPSY  12/09/2022   Procedure: BIOPSY;  Surgeon: Benancio Deeds, MD;  Location: [facility];  Service: Gastroenterology;;   BRONCHIAL BIOPSY  04/05/2023   Procedure: BRONCHIAL BIOPSIES;  Surgeon: Luciano Cutter, MD;  Location: [facility];  Service: Cardiopulmonary;;   BRONCHIAL BRUSHINGS  04/05/2023   Procedure: BRONCHIAL BRUSHINGS;  Surgeon: Luciano Cutter, MD;   Location: [facility];  Service: Cardiopulmonary;;   BRONCHIAL WASHINGS  04/05/2023   Procedure: BRONCHIAL WASHINGS;  Surgeon: Luciano Cutter, MD;  Location: [facility];  Service: Cardiopulmonary;;   CATARACT EXTRACTION Right    CHOLECYSTECTOMY     COLONOSCOPY  10/24/2009   normal rectum/1X1cm abnormal lesion in the ascending colon (bx benign). TI normal for 10cm.  Prep difficult/inadequate. f/u TCS 09/2012 recommended   COLONOSCOPY  10/13/2004   Normal rectum/Diminutive polyps, splenic flexure, cold biopsied/removed.  Remainder of colonic mucosa appeared normal.   COLONOSCOPY N/A 12/14/2012   RUE:[id number] polyp-tubular adenoma   COLONOSCOPY WITH PROPOFOL N/A 12/09/2022   Procedure: COLONOSCOPY WITH PROPOFOL;  Surgeon: Benancio Deeds, MD;  Location: [facility];  Service: Gastroenterology;  Laterality: N/A;   ESOPHAGOGASTRODUODENOSCOPY  10/13/2004    Normal esophagus/ Nodular volcano like lesion in the antrum, either representing a  pancreatic rest or leiomyoma, biopsied.  Remainder of the gastric mucosa appeared normal, normal D1-D2   ESOPHAGOGASTRODUODENOSCOPY  10/24/2009   Benign biopsies. normal esophagus/small hiatal hernia/nodular lesion antrum/distal greater curvature. duodenal AVM s/p ablation   GIVENS CAPSULE STUDY  07/27/2010    multiple arteriovenous malformations which could definitely be the contributor to her drifting hemoglobin and hematocrit   HEMOSTASIS CONTROL  04/05/2023   Procedure: HEMOSTASIS CONTROL;  Surgeon: Luciano Cutter, MD;  Location: [facility];  Service: Cardiopulmonary;;   HOT HEMOSTASIS N/A 12/09/2022   Procedure: HOT HEMOSTASIS (ARGON PLASMA COAGULATION/BICAP);  Surgeon: Benancio Deeds, MD;  Location: [facility];  Service: Gastroenterology;  Laterality: N/A;   IR THORACENTESIS ASP PLEURAL SPACE W/IMG GUIDE  12/16/2020   POLYPECTOMY  12/09/2022   Procedure: POLYPECTOMY;  Surgeon: Benancio Deeds, MD;  Location: [facility];  Service:  Gastroenterology;;   THORACENTESIS  04/05/2023   Procedure: THORACENTESIS;  Surgeon: Luciano Cutter, MD;  Location: [facility];  Service: Cardiopulmonary;;   THORACENTESIS Right 06/08/2023   Procedure: Alanson Puls;  Surgeon: Lorin Glass, MD;  Location: [facility];  Service: Pulmonary;  Laterality: Right;   THORACENTESIS Right 08/29/2023   Procedure: THORACENTESIS;  Surgeon: Charlott Holler, MD;  Location: [facility];  Service: Pulmonary;  Laterality:  Right;   TUBAL LIGATION     US ECHOCARDIOGRAPHY     VIDEO BRONCHOSCOPY N/A 04/05/2023   Procedure: VIDEO BRONCHOSCOPY WITHOUT FLUORO;  Surgeon: Luciano Cutter, MD;  Location: [facility];  Service: Cardiopulmonary;  Laterality: N/A;   VIDEO BRONCHOSCOPY WITH ENDOBRONCHIAL ULTRASOUND N/A 04/27/2018   Procedure: VIDEO BRONCHOSCOPY WITH ENDOBRONCHIAL ULTRASOUND;  Surgeon: Loreli Slot, MD;  Location: [facility] OR;  Service: Thoracic;  Laterality: N/A;    Family History family history includes Breast cancer in her cousin; Colon cancer in her maternal aunt; Diabetes in her daughter, daughter, and sister.  Social History Social History   Socioeconomic History   Marital status: Single    Spouse name: Not on file   Number of children: 3   Years of education: Not on file   Highest education level: Not on file  Occupational History   Occupation: retired  Tobacco Use   Smoking status: Former    Current packs/day: 0.00    Average packs/day: 0.5 packs/day for 55.0 years (27.5 ttl pk-yrs)    Types: Cigarettes    Start date: 03/19/1963    Quit date: 03/18/2018    Years since quitting: 5.4   Smokeless tobacco: Never   Tobacco comments:    smoked off and n  Vaping Use   Vaping status: Never Used  Substance and Sexual Activity   Alcohol use: No   Drug use: No   Sexual activity: Not Currently  Other Topics Concern   Not on file  Social History Narrative   Patient fully vaccinated   Social Drivers of Health   Financial  Resource Strain: Low Risk  (09/30/2020)   Overall Financial Resource Strain (CARDIA)    Difficulty of Paying Living Expenses: Not hard at all  Food Insecurity: No Food Insecurity (08/08/2023)   Hunger Vital Sign    Worried About Running Out of Food in the Last Year: Never true    Ran Out of Food in the Last Year: Never true  Transportation Needs: No Transportation Needs (08/08/2023)   PRAPARE - Administrator, Civil Service (Medical): No    Lack of Transportation (Non-Medical): No  Physical Activity: Inactive (09/30/2020)   Exercise Vital Sign    Days of Exercise per Week: 0 days    Minutes of Exercise per Session: 0 min  Stress: No Stress Concern Present (09/30/2020)   [employer] of Occupational Health - Occupational Stress Questionnaire    Feeling of Stress : Not at all  Social Connections: Socially Isolated (09/30/2020)   Social Connection and Isolation Panel [NHANES]    Frequency of Communication with Friends and Family: More than three times a week    Frequency of Social Gatherings with Friends and Family: More than three times a week    Attends Religious Services: Never    Database administrator or Organizations: No    Attends Banker Meetings: Never    Marital Status: Never married  Intimate Partner Violence: Not At Risk (08/08/2023)   Humiliation, Afraid, Rape, and Kick questionnaire    Fear of Current or Ex-Partner: No    Emotionally Abused: No    Physically Abused: No    Sexually Abused: No    Lab Results  Component Value Date   HGBA1C 6.1 (A) 09/12/2023   HGBA1C 6.3 (A) 05/18/2023   HGBA1C 6.9 (H) 12/22/2022   Lab Results  Component Value Date   CHOL 136 12/22/2022   Lab Results  Component Value Date  HDL 41.50 12/22/2022   Lab Results  Component Value Date   LDLCALC 74 12/22/2022   Lab Results  Component Value Date   TRIG 101.0 12/22/2022   Lab Results  Component Value Date   CHOLHDL 3 12/22/2022   Lab Results   Component Value Date   CREATININE 1.53 (H) 08/18/2023   Lab Results  Component Value Date   GFR 32.69 (L) 08/18/2023   Lab Results  Component Value Date   MICROALBUR 30.5 (H) 12/22/2022      Component Value Date/Time   NA 138 08/18/2023 1409   K 4.6 08/18/2023 1409   CL 105 08/18/2023 1409   CO2 25 08/18/2023 1409   GLUCOSE 183 (H) 08/18/2023 1409   BUN 34 (H) 08/18/2023 1409   BUN 24 (A) 02/12/2020 0000   CREATININE 1.53 (H) 08/18/2023 1409   CREATININE 1.25 (H) 05/03/2023 1019   CREATININE 0.72 12/10/2011 0919   CALCIUM 8.9 08/18/2023 1409   PROT 8.1 08/18/2023 1409   ALBUMIN 3.9 08/18/2023 1409   ALBUMIN 4.3 12/10/2011 0919   AST 23 08/18/2023 1409   AST 23 05/03/2023 1019   ALT 25 08/18/2023 1409   ALT 18 05/03/2023 1019   ALKPHOS 119 (H) 08/18/2023 1409   ALKPHOS 88 12/10/2011 0919   BILITOT 0.3 08/18/2023 1409   BILITOT 0.4 05/03/2023 1019   GFRNONAA 29 (L) 08/05/2023 0444   GFRNONAA 45 (L) 05/03/2023 1019   GFRAA 43 (L) 02/12/2020 0941      Latest Ref Rng & Units 08/18/2023    2:09 PM 08/05/2023    4:44 AM 08/04/2023   11:12 PM  BMP  Glucose 70 - 99 mg/dL 161  096    BUN 6 - 23 mg/dL 34  32    Creatinine 0.45 - 1.20 mg/dL 4.09  8.11    Sodium 914 - 145 mEq/L 138  137    Potassium 3.5 - 5.1 mEq/L 4.6  4.6    4.6  4.9   Chloride 96 - 112 mEq/L 105  109    CO2 19 - 32 mEq/L 25  17    Calcium 8.4 - 10.5 mg/dL 8.9  8.3         Component Value Date/Time   WBC 4.7 08/03/2023 0411   RBC 3.50 (L) 08/03/2023 0411   HGB 9.4 (L) 08/03/2023 0411   HGB 10.4 (L) 05/03/2023 1023   HGB 8.1 (L) 12/27/2017 1123   HCT 30.5 (L) 08/03/2023 0411   HCT 24.4 (L) 12/27/2017 1123   PLT 170 08/03/2023 0411   PLT 214 05/03/2023 1023   PLT 296 12/27/2017 1123   MCV 87.1 08/03/2023 0411   MCV 70 (L) 12/27/2017 1123   MCH 26.9 08/03/2023 0411   MCHC 30.8 08/03/2023 0411   RDW 15.6 (H) 08/03/2023 0411   RDW 17.0 (H) 12/27/2017 1123   LYMPHSABS 0.5 (L) 08/02/2023 1646    LYMPHSABS 0.8 12/27/2017 1123   MONOABS 0.3 08/02/2023 1646   EOSABS 0.0 08/02/2023 1646   EOSABS 0.1 12/27/2017 1123   BASOSABS 0.0 08/02/2023 1646   BASOSABS 0.0 12/27/2017 1123     Parts of this note may have been dictated using voice recognition software. There may be variances in spelling and vocabulary which are unintentional. Not all errors are proofread. Please notify the Thereasa Parkin if any discrepancies are noted or if the meaning of any statement is not clear.

## 2023-09-20 ENCOUNTER — Encounter: Payer: Self-pay | Admitting: Internal Medicine

## 2023-09-20 ENCOUNTER — Ambulatory Visit: Payer: 59 | Admitting: Internal Medicine

## 2023-09-20 VITALS — BP 134/78 | HR 98 | Temp 98.5°F | Ht 65.0 in | Wt 195.0 lb

## 2023-09-20 DIAGNOSIS — D509 Iron deficiency anemia, unspecified: Secondary | ICD-10-CM | POA: Diagnosis not present

## 2023-09-20 DIAGNOSIS — E875 Hyperkalemia: Secondary | ICD-10-CM | POA: Diagnosis not present

## 2023-09-20 DIAGNOSIS — J90 Pleural effusion, not elsewhere classified: Secondary | ICD-10-CM

## 2023-09-20 DIAGNOSIS — I10 Essential (primary) hypertension: Secondary | ICD-10-CM | POA: Diagnosis not present

## 2023-09-20 LAB — COMPREHENSIVE METABOLIC PANEL
ALT: 27 U/L (ref 0–35)
AST: 32 U/L (ref 0–37)
Albumin: 3.9 g/dL (ref 3.5–5.2)
Alkaline Phosphatase: 102 U/L (ref 39–117)
BUN: 31 mg/dL — ABNORMAL HIGH (ref 6–23)
CO2: 23 meq/L (ref 19–32)
Calcium: 9.3 mg/dL (ref 8.4–10.5)
Chloride: 110 meq/L (ref 96–112)
Creatinine, Ser: 1.51 mg/dL — ABNORMAL HIGH (ref 0.40–1.20)
GFR: 33.19 mL/min — ABNORMAL LOW (ref >60.00)
Glucose, Bld: 102 mg/dL — ABNORMAL HIGH (ref 70–99)
Potassium: 4.8 meq/L (ref 3.5–5.1)
Sodium: 142 meq/L (ref 135–145)
Total Bilirubin: 0.3 mg/dL (ref 0.2–1.2)
Total Protein: 8.3 g/dL (ref 6.0–8.3)

## 2023-09-20 LAB — CBC
HCT: 37 % (ref 36.0–46.0)
Hemoglobin: 11.7 g/dL — ABNORMAL LOW (ref 12.0–15.0)
MCHC: 31.5 g/dL (ref 30.0–36.0)
MCV: 86 fL (ref 78.0–100.0)
Platelets: 196 10*3/uL (ref 150.0–400.0)
RBC: 4.3 Mil/uL (ref 3.87–5.11)
RDW: 16.2 % — ABNORMAL HIGH (ref 11.5–15.5)
WBC: 6.1 10*3/uL (ref 4.0–10.5)

## 2023-09-20 LAB — FERRITIN: Ferritin: 36.6 ng/mL (ref 10.0–291.0)

## 2023-09-20 MED ORDER — FUROSEMIDE 20 MG PO TABS: 20.0000 mg | ORAL_TABLET | Freq: Every day | ORAL | 3 refills | Status: DC | PRN

## 2023-09-20 NOTE — Progress Notes (Signed)
   Subjective:   Patient ID: Jenna [name], female    DOB: 1946/06/29, 78 y.o.   MRN: [id number]  HPI The patient is a 78 YO female coming in for ongoing follow up of hyperkalemia. She had labs normal drawn last month and is still on alternative regimen. Denies eating excessive amounts of potassium and has not resumed her ARB. Denies blood in stool or dark stools. Had some high BP recently. Also having some swelling.  Review of Systems  Constitutional: Negative.   HENT: Negative.    Eyes: Negative.   Respiratory:  Negative for cough, chest tightness and shortness of breath.   Cardiovascular:  Positive for leg swelling. Negative for chest pain and palpitations.  Gastrointestinal:  Negative for abdominal distention, abdominal pain, constipation, diarrhea, nausea and vomiting.  Musculoskeletal: Negative.   Skin: Negative.   Neurological: Negative.   Psychiatric/Behavioral: Negative.      Objective:  Physical Exam Constitutional:      Appearance: She is well-developed.  HENT:     Head: Normocephalic and atraumatic.  Cardiovascular:     Rate and Rhythm: Normal rate and regular rhythm.  Pulmonary:     Effort: Pulmonary effort is normal. No respiratory distress.     Breath sounds: Normal breath sounds. No wheezing or rales.  Abdominal:     General: Bowel sounds are normal. There is no distension.     Palpations: Abdomen is soft.     Tenderness: There is no abdominal tenderness. There is no rebound.  Musculoskeletal:     Cervical back: Normal range of motion.     Right lower leg: Edema present.     Left lower leg: Edema present.  Skin:    General: Skin is warm and dry.  Neurological:     Mental Status: She is alert and oriented to person, place, and time.     Coordination: Coordination normal.     Vitals:   09/20/23 1329  BP: 134/78  Pulse: 98  Temp: 98.5 F (36.9 C)  TempSrc: Oral  SpO2: 96%  Weight: 195 lb (88.5 kg)  Height: 5' 5 (1.651 m)    Assessment  & Plan:  Visit time 20 minutes in face to face communication with patient and coordination of care, additional 10 minutes spent in record review, coordination or care, ordering tests, communicating/referring to other healthcare professionals, documenting in medical records all on the same day of the visit for total time 30 minutes spent on the visit.

## 2023-09-20 NOTE — Patient Instructions (Signed)
We will check the labs today.  We have sent in a fluid pill lasix that you can take daily if needed for swelling in your leg.

## 2023-09-23 ENCOUNTER — Encounter: Payer: Self-pay | Admitting: Internal Medicine

## 2023-09-23 NOTE — Assessment & Plan Note (Signed)
 Checking CMP. Would never resume ACE-I/ARB.

## 2023-09-23 NOTE — Assessment & Plan Note (Signed)
 CBC lower in hospital prior to discharge. Will recheck CBC and ferritin with some decreasing exertion capacity since discharge. Treat as appropriate.

## 2023-09-23 NOTE — Assessment & Plan Note (Signed)
 Higher BP and swelling since off ARB. Will rx lasix  20 mg daily to help. Checking CMP today to assess potasium and renal function.

## 2023-09-23 NOTE — Assessment & Plan Note (Signed)
 We discussed thoracentesis and recommendation to consider catheter for drainage. She gets recurrent SOB shortly after thoracentesis.

## 2023-09-25 ENCOUNTER — Other Ambulatory Visit: Payer: Self-pay | Admitting: Nurse Practitioner

## 2023-09-25 DIAGNOSIS — C342 Malignant neoplasm of middle lobe, bronchus or lung: Secondary | ICD-10-CM

## 2023-10-13 ENCOUNTER — Other Ambulatory Visit: Payer: 59

## 2023-11-07 ENCOUNTER — Ambulatory Visit (HOSPITAL_COMMUNITY)
Admission: RE | Admit: 2023-11-07 | Discharge: 2023-11-07 | Disposition: A | Discharge status: Home / Self Care | Payer: 59 | Source: Ambulatory Visit | Attending: Internal Medicine | Admitting: Internal Medicine

## 2023-11-07 DIAGNOSIS — J9819 Other pulmonary collapse: Secondary | ICD-10-CM | POA: Diagnosis not present

## 2023-11-07 DIAGNOSIS — C3490 Malignant neoplasm of unspecified part of unspecified bronchus or lung: Secondary | ICD-10-CM | POA: Diagnosis not present

## 2023-11-07 DIAGNOSIS — I700 Atherosclerosis of aorta: Secondary | ICD-10-CM | POA: Diagnosis not present

## 2023-11-07 DIAGNOSIS — J90 Pleural effusion, not elsewhere classified: Secondary | ICD-10-CM | POA: Diagnosis not present

## 2023-11-08 ENCOUNTER — Other Ambulatory Visit (HOSPITAL_COMMUNITY): Payer: 59

## 2023-11-10 ENCOUNTER — Inpatient Hospital Stay (HOSPITAL_BASED_OUTPATIENT_CLINIC_OR_DEPARTMENT_OTHER): Payer: 59 | Admitting: Internal Medicine

## 2023-11-10 ENCOUNTER — Inpatient Hospital Stay: Payer: 59 | Attending: Internal Medicine

## 2023-11-10 VITALS — BP 190/62 | HR 88 | Temp 98.0°F | Resp 18 | Ht 65.0 in | Wt 198.6 lb

## 2023-11-10 DIAGNOSIS — Z923 Personal history of irradiation: Secondary | ICD-10-CM | POA: Diagnosis not present

## 2023-11-10 DIAGNOSIS — C3490 Malignant neoplasm of unspecified part of unspecified bronchus or lung: Secondary | ICD-10-CM | POA: Diagnosis not present

## 2023-11-10 DIAGNOSIS — Z9221 Personal history of antineoplastic chemotherapy: Secondary | ICD-10-CM | POA: Insufficient documentation

## 2023-11-10 DIAGNOSIS — J90 Pleural effusion, not elsewhere classified: Secondary | ICD-10-CM | POA: Insufficient documentation

## 2023-11-10 DIAGNOSIS — Z85118 Personal history of other malignant neoplasm of bronchus and lung: Secondary | ICD-10-CM | POA: Diagnosis not present

## 2023-11-10 LAB — CBC WITH DIFFERENTIAL (CANCER CENTER ONLY)
Abs Immature Granulocytes: 0.02 10*3/uL (ref 0.00–0.07)
Basophils Absolute: 0 10*3/uL (ref 0.0–0.1)
Basophils Relative: 1 %
Eosinophils Absolute: 0.1 10*3/uL (ref 0.0–0.5)
Eosinophils Relative: 2 %
HCT: 37.7 % (ref 36.0–46.0)
Hemoglobin: 11.6 g/dL — ABNORMAL LOW (ref 12.0–15.0)
Immature Granulocytes: 1 %
Lymphocytes Relative: 13 %
Lymphs Abs: 0.6 10*3/uL — ABNORMAL LOW (ref 0.7–4.0)
MCH: 27 pg (ref 26.0–34.0)
MCHC: 30.8 g/dL (ref 30.0–36.0)
MCV: 87.7 fL (ref 80.0–100.0)
Monocytes Absolute: 0.3 10*3/uL (ref 0.1–1.0)
Monocytes Relative: 8 %
Neutro Abs: 3.2 10*3/uL (ref 1.7–7.7)
Neutrophils Relative %: 75 %
Platelet Count: 174 10*3/uL (ref 150–400)
RBC: 4.3 MIL/uL (ref 3.87–5.11)
RDW: 15.2 % (ref 11.5–15.5)
WBC Count: 4.3 10*3/uL (ref 4.0–10.5)
nRBC: 0 % (ref 0.0–0.2)

## 2023-11-10 LAB — CMP (CANCER CENTER ONLY)
ALT: 19 U/L (ref 0–44)
AST: 24 U/L (ref 15–41)
Albumin: 3.8 g/dL (ref 3.5–5.0)
Alkaline Phosphatase: 86 U/L (ref 38–126)
Anion gap: 6 (ref 5–15)
BUN: 32 mg/dL — ABNORMAL HIGH (ref 8–23)
CO2: 24 mmol/L (ref 22–32)
Calcium: 9.3 mg/dL (ref 8.9–10.3)
Chloride: 111 mmol/L (ref 98–111)
Creatinine: 1.71 mg/dL — ABNORMAL HIGH (ref 0.44–1.00)
GFR, Estimated: 30 mL/min — ABNORMAL LOW (ref >60)
Glucose, Bld: 123 mg/dL — ABNORMAL HIGH (ref 70–99)
Potassium: 4.8 mmol/L (ref 3.5–5.1)
Sodium: 141 mmol/L (ref 135–145)
Total Bilirubin: 0.3 mg/dL (ref 0.0–1.2)
Total Protein: 8.1 g/dL (ref 6.5–8.1)

## 2023-11-10 NOTE — Progress Notes (Signed)
 [facility] Telephone:([phone]   [phone]  OFFICE PROGRESS NOTE  Jenna Broker, MD [address] [place] [address]  DIAGNOSIS: Stage IIIB (T3, N3, M0) non-small cell lung cancer, squamous cell carcinoma presented with large right middle lobe lung breast in addition to mediastinal and bilateral hilar lymphadenopathy and suspicious right supraclavicular lymph node diagnosed in August 2019.    PRIOR THERAPY:  1) A course of concurrent chemoradiation with weekly carboplatin for AUC of 2 and paclitaxel 45 MG/M2.  First dose of chemotherapy given on 05/29/2018.  Status post 5 cycles. 2) Consolidation treatment with immunotherapy with Imfinzi 10 mg/KG every 2 weeks.  First dose August 10, 2018.  Status post 26 cycles.   CURRENT THERAPY:  Observation.  INTERVAL HISTORY: Jenna [name] 78 y.o. female returns to the clinic today for follow-up visit. Discussed the use of AI scribe software for clinical note transcription with the patient, who gave verbal consent to proceed.  History of Present Illness   Jenna [name] is a 78 year old female with stage 3B non-small cell lung cancer who presents for evaluation with repeat CT scan of the chest for restaging of her disease.  She has a history of stage 3B non-small cell lung cancer, diagnosed in August 2019. She completed concurrent chemoradiation with weekly carboplatin and paclitaxel, followed by one year of immunotherapy, which concluded in December 2020. Since then, she has been under observation. A recent CT scan was performed for restaging, and she awaits the official report.  She experiences recurrent pleural effusion on the right side, which has been previously drained. Her breathing is manageable when the fluid is drained, but it has accumulated again, causing discomfort. No current chest pain, nausea, vomiting, or diarrhea, although she had diarrhea when she was previously  sick.  In December, she experienced an episode of arrhythmia after taking losartan and labetalol, leading to a hospital stay of two to three days. No current chest pain, nausea, vomiting, or diarrhea. She has an upcoming appointment with her pulmonologist on April 9th.        MEDICAL HISTORY: Past Medical History:  Diagnosis Date   Blood transfusion without reported diagnosis    Cataract    DM (diabetes mellitus) (HCC)    Dyspnea    GERD (gastroesophageal reflux disease)    Hyperlipidemia    Hypertension    Iron deficiency anemia    NSCL ca dx'd 03/2018   Pneumonia    Thyroid disease     ALLERGIES:  is allergic to paclitaxel, aspirin, esomeprazole magnesium, losartan, and ace inhibitors.  MEDICATIONS:  Current Outpatient Medications  Medication Sig Dispense Refill   Accu-Chek Softclix Lancets lancets Use as instructed 100 each 12   albuterol (VENTOLIN HFA) 108 (90 Base) MCG/ACT inhaler Inhale 1-2 puffs into the lungs every 6 (six) hours as needed for wheezing or shortness of breath. 6.7 g 2   blood glucose meter kit and supplies KIT Use up to four times daily as directed/accu check guide kit 1 each 0   blood glucose meter kit and supplies Dispense based on patient and insurance preference. Use up to four times daily as directed. (FOR ICD-10 E10.9, E11.9). 1 each 0   Continuous Glucose Sensor (DEXCOM G7 SENSOR) MISC 1 Device by Does not apply route continuous. (Patient not taking: Reported on 09/20/2023) 9 each 0   diphenhydrAMINE (BENADRYL) 2 % cream Apply 1 application topically as needed for itching.  FARXIGA 10 MG TABS tablet Take 1 tablet (10 mg total) by mouth daily before breakfast. Follow-up appt due in Nov must see provider for future refills 90 tablet 1   Ferrous Sulfate (IRON) 325 (65 Fe) MG TABS Take 1 tablet (325 mg total) by mouth 2 (two) times daily with a meal. (Patient not taking: Reported on 09/20/2023) 180 tablet 0   fluticasone furoate-vilanterol (BREO ELLIPTA)  200-25 MCG/ACT AEPB Inhale 1 puff by mouth once daily 60 each 3   furosemide (LASIX) 20 MG tablet Take 1 tablet (20 mg total) by mouth daily as needed. 30 tablet 3   Glucose Blood (BLOOD GLUCOSE TEST STRIPS 333) STRP 1 strip by In Vitro route 4 (four) times daily. 100 strip 0   insulin glargine (LANTUS SOLOSTAR) 100 UNIT/ML Solostar Pen Inject 34 Units into the skin daily. And 31G, 5mm pen needles 1/day (Patient taking differently: Inject 30 Units into the skin daily. And 31G, 5mm pen needles 1/day) 30 mL 11   Insulin Pen Needle 32G X 4 MM MISC Inject 1 Needle into the skin every morning. 100 each 1   levothyroxine (SYNTHROID) 75 MCG tablet Take 1 tablet by mouth once daily 90 tablet 3   meclizine (ANTIVERT) 25 MG tablet Take 1 tablet (25 mg total) by mouth 3 (three) times daily as needed for dizziness. 30 tablet 0   metoprolol succinate (TOPROL XL) 25 MG 24 hr tablet Take 0.5 tablets (12.5 mg total) by mouth daily. 15 tablet 11   Multiple Vitamin (MULTIVITAMIN) capsule Take 1 capsule by mouth daily.     pravastatin (PRAVACHOL) 20 MG tablet Take 1 tablet (20 mg total) by mouth daily. 90 tablet 3   No current facility-administered medications for this visit.    SURGICAL HISTORY:  Past Surgical History:  Procedure Laterality Date   BIOPSY  12/09/2022   Procedure: BIOPSY;  Surgeon: Benancio Deeds, MD;  Location: [facility];  Service: Gastroenterology;;   BRONCHIAL BIOPSY  04/05/2023   Procedure: BRONCHIAL BIOPSIES;  Surgeon: Luciano Cutter, MD;  Location: [facility];  Service: Cardiopulmonary;;   BRONCHIAL BRUSHINGS  04/05/2023   Procedure: BRONCHIAL BRUSHINGS;  Surgeon: Luciano Cutter, MD;  Location: [facility];  Service: Cardiopulmonary;;   BRONCHIAL WASHINGS  04/05/2023   Procedure: BRONCHIAL WASHINGS;  Surgeon: Luciano Cutter, MD;  Location: [facility];  Service: Cardiopulmonary;;   CATARACT EXTRACTION Right    CHOLECYSTECTOMY     COLONOSCOPY  10/24/2009   normal  rectum/1X1cm abnormal lesion in the ascending colon (bx benign). TI normal for 10cm.  Prep difficult/inadequate. f/u TCS 09/2012 recommended   COLONOSCOPY  10/13/2004   Normal rectum/Diminutive polyps, splenic flexure, cold biopsied/removed.  Remainder of colonic mucosa appeared normal.   COLONOSCOPY N/A 12/14/2012   ZOX:WRUEAVW polyp-tubular adenoma   COLONOSCOPY WITH PROPOFOL N/A 12/09/2022   Procedure: COLONOSCOPY WITH PROPOFOL;  Surgeon: Benancio Deeds, MD;  Location: [facility];  Service: Gastroenterology;  Laterality: N/A;   ESOPHAGOGASTRODUODENOSCOPY  10/13/2004    Normal esophagus/ Nodular volcano like lesion in the antrum, either representing a  pancreatic rest or leiomyoma, biopsied.  Remainder of the gastric mucosa appeared normal, normal D1-D2   ESOPHAGOGASTRODUODENOSCOPY  10/24/2009   Benign biopsies. normal esophagus/small hiatal hernia/nodular lesion antrum/distal greater curvature. duodenal AVM s/p ablation   GIVENS CAPSULE STUDY  07/27/2010    multiple arteriovenous malformations which could definitely be the contributor to her drifting hemoglobin and hematocrit   HEMOSTASIS CONTROL  04/05/2023   Procedure: HEMOSTASIS CONTROL;  Surgeon: Luciano Cutter, MD;  Location: [facility];  Service: Cardiopulmonary;;   HOT HEMOSTASIS N/A 12/09/2022   Procedure: HOT HEMOSTASIS (ARGON PLASMA COAGULATION/BICAP);  Surgeon: Benancio Deeds, MD;  Location: [facility];  Service: Gastroenterology;  Laterality: N/A;   IR THORACENTESIS ASP PLEURAL SPACE W/IMG GUIDE  12/16/2020   POLYPECTOMY  12/09/2022   Procedure: POLYPECTOMY;  Surgeon: Benancio Deeds, MD;  Location: [facility];  Service: Gastroenterology;;   THORACENTESIS  04/05/2023   Procedure: THORACENTESIS;  Surgeon: Luciano Cutter, MD;  Location: [facility];  Service: Cardiopulmonary;;   THORACENTESIS Right 06/08/2023   Procedure: Alanson Puls;  Surgeon: Lorin Glass, MD;  Location: [facility];  Service:  Pulmonary;  Laterality: Right;   THORACENTESIS Right 08/29/2023   Procedure: THORACENTESIS;  Surgeon: Charlott Holler, MD;  Location: [facility] END[facility]COPY;  Service: Pulmonary;  Laterality: Right;   TUBAL LIGATION     US ECHOCARDIOGRAPHY     VIDEO BRONCHOSCOPY N/A 04/05/2023   Procedure: VIDEO BRONCHOSCOPY WITHOUT FLUORO;  Surgeon: Luciano Cutter, MD;  Location: [facility];  Service: Cardiopulmonary;  Laterality: N/A;   VIDEO BRONCHOSCOPY WITH ENDOBRONCHIAL ULTRASOUND N/A 04/27/2018   Procedure: VIDEO BRONCHOSCOPY WITH ENDOBRONCHIAL ULTRASOUND;  Surgeon: Loreli Slot, MD;  Location: [facility];  Service: Thoracic;  Laterality: N/A;    REVIEW OF SYSTEMS:  A comprehensive review of systems was negative except for: Constitutional: positive for fatigue Respiratory: positive for dyspnea on exertion   PHYSICAL EXAMINATION: General appearance: alert, cooperative, fatigued, and no distress Head: Normocephalic, without obvious abnormality, atraumatic Neck: no adenopathy, no JVD, supple, symmetrical, trachea midline, and thyroid not enlarged, symmetric, no tenderness/mass/nodules Lymph nodes: Cervical, supraclavicular, and axillary nodes normal. Resp: diminished breath sounds RLL and dullness to percussion RLL Back: symmetric, no curvature. ROM normal. No CVA tenderness. Cardio: regular rate and rhythm, S1, S2 normal, no murmur, click, rub or gallop GI: soft, non-tender; bowel sounds normal; no masses,  no organomegaly Extremities: extremities normal, atraumatic, no cyanosis or edema  ECOG PERFORMANCE STATUS: 1 - Symptomatic but completely ambulatory  Blood pressure (!) 190/62, pulse 88, temperature 98 F (36.7 C), temperature source Temporal, resp. rate 18, height 5\' 5"  (1.651 m), weight 198 lb 9.6 oz (90.1 kg), SpO2 98%.  LABORATORY DATA: Lab Results  Component Value Date   WBC 4.3 11/10/2023   HGB 11.6 (L) 11/10/2023   HCT 37.7 11/10/2023   MCV 87.7 11/10/2023   PLT 174 11/10/2023       Chemistry      Component Value Date/Time   NA 141 11/10/2023 1056   K 4.8 11/10/2023 1056   CL 111 11/10/2023 1056   CO2 24 11/10/2023 1056   BUN 32 (H) 11/10/2023 1056   BUN 24 (A) 02/12/2020 0000   CREATININE 1.71 (H) 11/10/2023 1056   CREATININE 0.72 12/10/2011 0919      Component Value Date/Time   CALCIUM 9.3 11/10/2023 1056   ALKPHOS 86 11/10/2023 1056   ALKPHOS 88 12/10/2011 0919   AST 24 11/10/2023 1056   ALT 19 11/10/2023 1056   BILITOT 0.3 11/10/2023 1056       RADIOGRAPHIC STUDIES: No results found.   ASSESSMENT AND PLAN: This is a very pleasant 78 years old African-American female recently diagnosed with a stage IIIB non-small cell lung cancer, squamous cell carcinoma.  She completed a course of concurrent chemoradiation with weekly carboplatin and paclitaxel status post 5 cycles with partial response.  She tolerated this treatment well except for the pancytopenia and fatigue.  The patient completed a course of treatment with consolidation immunotherapy with Imfinzi status post 26 cycles.  She tolerated her treatment well with no concerning adverse effects.   The patient is currently on observation for several years. She had repeat CT scan of the chest performed recently.  I personally independently reviewed the scan images and discussed it with the patient today.  The final report of the CT scan is still pending Her scan showed the large right pleural effusion.    Stage 3B non-small cell lung cancer Diagnosed in August 2019, she completed concurrent chemoradiation with weekly carboplatin and paclitaxel, followed by one year of immunotherapy with Imfinzi, completed in December 2020. A recent CT scan for restaging shows no significant changes, though possible inflammation on the left side is noted. Awaiting official radiology report for further assessment. - Await official radiology report for CT scan - Contact her if CT scan shows concerning findings - Schedule  follow-up in six months if CT scan is unremarkable  Pleural effusion Recurrent pleural effusion on the right side, previously drained. Recent CT scan indicates significant fluid accumulation around the right lung, necessitating another drainage. Breathing is manageable post-drainage. Discussed and agreed on potential placement of a pleural catheter (Plurex catheter) for ongoing management, to be removed once fluid resolves. - Coordinate with pulmonologist Dr. Celine Mans for pleural drainage - Discuss potential placement of a pleural catheter with Dr. Celine Mans - Advise her to contact Dr. Celine Mans if she experiences increased dyspnea before the next appointment   The patient was advised to call immediately if she has any concerning symptoms in the interval. The patient voices understanding of current disease status and treatment options and is in agreement with the current care plan. All questions were answered. The patient knows to call the clinic with any problems, questions or concerns. We can certainly see the patient much sooner if necessary. The total time spent in the appointment was 20 minutes.  Disclaimer: This note was dictated with voice recognition software. Similar sounding words can inadvertently be transcribed and may not be corrected upon review.

## 2023-11-22 ENCOUNTER — Other Ambulatory Visit: Payer: Self-pay

## 2023-11-23 ENCOUNTER — Ambulatory Visit: Payer: 59 | Admitting: Internal Medicine

## 2023-11-23 ENCOUNTER — Encounter: Payer: Self-pay | Admitting: Internal Medicine

## 2023-11-23 VITALS — BP 140/70 | HR 87 | Ht 65.0 in | Wt 198.8 lb

## 2023-11-23 DIAGNOSIS — Z9221 Personal history of antineoplastic chemotherapy: Secondary | ICD-10-CM | POA: Diagnosis not present

## 2023-11-23 DIAGNOSIS — Z87891 Personal history of nicotine dependence: Secondary | ICD-10-CM | POA: Diagnosis not present

## 2023-11-23 DIAGNOSIS — R053 Chronic cough: Secondary | ICD-10-CM | POA: Diagnosis not present

## 2023-11-23 DIAGNOSIS — Z85118 Personal history of other malignant neoplasm of bronchus and lung: Secondary | ICD-10-CM

## 2023-11-23 DIAGNOSIS — Z923 Personal history of irradiation: Secondary | ICD-10-CM | POA: Diagnosis not present

## 2023-11-23 DIAGNOSIS — J90 Pleural effusion, not elsewhere classified: Secondary | ICD-10-CM | POA: Diagnosis not present

## 2023-11-23 DIAGNOSIS — J439 Emphysema, unspecified: Secondary | ICD-10-CM

## 2023-11-23 NOTE — Patient Instructions (Addendum)
 It was a pleasure to see you today!  Please schedule follow up with myself in 1 months.  If my schedule is not open yet, we will contact you with a reminder closer to that time. Please call [phone] if you haven't heard from [place] a month before, and always call us sooner if issues or concerns arise. You can also send [place] a message through MyChart, but but aware that this is not to be used for urgent issues and it may take up to 5-7 days to receive a reply. Please be aware that you will likely be able to view your results before I have a chance to respond to them. Please give [place] 5 business days to respond to any non-urgent results.    I am placing an order for an indwelling pleural catheter called a pleurx.   PleurXT Pleural Catheter System - please visit the website to learn more information and watch some videos.  Radiology will contact to you to have this placed. It is an outpatient procedure and you will need to bring someone.  We will work with your insurance to get the necessary tubing and supplies for home drainage.   I want to see you within a couple weeks of having this placed.

## 2023-11-23 NOTE — Progress Notes (Signed)
 Jenna [name]    [id number]    09/25/1945  Primary Care Physician:Crawford, Austin Miles, MD Date of Appointment: 11/23/2023 Established Patient Visit  Chief complaint:   Chief Complaint  Patient presents with   Follow-up    SOB on exertion. Getting better with the use of her inhaler.     HPI: Jenna [name] is a 78 y.o. woman with h/o tobacco use disorder and mild emphysema. 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   Interval Updates: Here for follow up. Saw Dr. Arbutus Ped. CT Chest shows recurrent right pleural effusion.  She reports benefit from thoracentesis. Does have pain towards the end of the procedure.   Inhalers provide minimum benefit.  She has been uncertain about pleurx in the past but will move forward if that's the best wa to manage the effusion.  No fevers chills  I have reviewed the patient's family social and past medical history and updated as appropriate.   Past Medical History:  Diagnosis Date   Blood transfusion without reported diagnosis    Cataract    DM (diabetes mellitus) (HCC)    Dyspnea    GERD (gastroesophageal reflux disease)    Hyperlipidemia    Hypertension    Iron deficiency anemia    NSCL ca dx'd 03/2018   Pneumonia    Thyroid disease     Past Surgical History:  Procedure Laterality Date   BIOPSY  12/09/2022   Procedure: BIOPSY;  Surgeon: Benancio Deeds, MD;  Location: [facility];  Service: Gastroenterology;;   BRONCHIAL BIOPSY  04/05/2023   Procedure: BRONCHIAL BIOPSIES;  Surgeon: Luciano Cutter, MD;  Location: [facility];  Service: Cardiopulmonary;;   BRONCHIAL BRUSHINGS  04/05/2023   Procedure: BRONCHIAL BRUSHINGS;  Surgeon: Luciano Cutter, MD;  Location: [facility];  Service: Cardiopulmonary;;   BRONCHIAL WASHINGS  04/05/2023   Procedure: BRONCHIAL WASHINGS;   Surgeon: Luciano Cutter, MD;  Location: [facility];  Service: Cardiopulmonary;;   CATARACT EXTRACTION Right    CHOLECYSTECTOMY     COLONOSCOPY  10/24/2009   normal rectum/1X1cm abnormal lesion in the ascending colon (bx benign). TI normal for 10cm.  Prep difficult/inadequate. f/u TCS 09/2012 recommended   COLONOSCOPY  10/13/2004   Normal rectum/Diminutive polyps, splenic flexure, cold biopsied/removed.  Remainder of colonic mucosa appeared normal.   COLONOSCOPY N/A 12/14/2012   WUJ:[id number] polyp-tubular adenoma   COLONOSCOPY WITH PROPOFOL N/A 12/09/2022   Procedure: COLONOSCOPY WITH PROPOFOL;  Surgeon: Benancio Deeds, MD;  Location: [facility];  Service: Gastroenterology;  Laterality: N/A;   ESOPHAGOGASTRODUODENOSCOPY  10/13/2004    Normal esophagus/ Nodular volcano like lesion in the antrum, either representing a  pancreatic rest or leiomyoma, biopsied.  Remainder of the gastric mucosa appeared normal, normal D1-D2   ESOPHAGOGASTRODUODENOSCOPY  10/24/2009   Benign biopsies. normal esophagus/small hiatal hernia/nodular lesion antrum/distal greater curvature. duodenal AVM s/p ablation   GIVENS CAPSULE STUDY  07/27/2010    multiple arteriovenous malformations which could definitely be the contributor to her drifting hemoglobin and hematocrit   HEMOSTASIS CONTROL  04/05/2023   Procedure: HEMOSTASIS CONTROL;  Surgeon: Luciano Cutter, MD;  Location: [facility];  Service: Cardiopulmonary;;   HOT HEMOSTASIS N/A 12/09/2022   Procedure: HOT HEMOSTASIS (ARGON PLASMA COAGULATION/BICAP);  Surgeon: Benancio Deeds, MD;  Location: [facility];  Service: Gastroenterology;  Laterality: N/A;   IR THORACENTESIS ASP  PLEURAL SPACE W/IMG GUIDE  12/16/2020   POLYPECTOMY  12/09/2022   Procedure: POLYPECTOMY;  Surgeon: Benancio Deeds, MD;  Location: [facility];  Service: Gastroenterology;;   THORACENTESIS  04/05/2023   Procedure: THORACENTESIS;  Surgeon: Luciano Cutter, MD;  Location:  [facility];  Service: Cardiopulmonary;;   THORACENTESIS Right 06/08/2023   Procedure: Alanson Puls;  Surgeon: Lorin Glass, MD;  Location: [facility];  Service: Pulmonary;  Laterality: Right;   THORACENTESIS Right 08/29/2023   Procedure: THORACENTESIS;  Surgeon: Charlott Holler, MD;  Location: [facility];  Service: Pulmonary;  Laterality: Right;   TUBAL LIGATION     US ECHOCARDIOGRAPHY     VIDEO BRONCHOSCOPY N/A 04/05/2023   Procedure: VIDEO BRONCHOSCOPY WITHOUT FLUORO;  Surgeon: Luciano Cutter, MD;  Location: [facility];  Service: Cardiopulmonary;  Laterality: N/A;   VIDEO BRONCHOSCOPY WITH ENDOBRONCHIAL ULTRASOUND N/A 04/27/2018   Procedure: VIDEO BRONCHOSCOPY WITH ENDOBRONCHIAL ULTRASOUND;  Surgeon: Loreli Slot, MD;  Location: [facility];  Service: Thoracic;  Laterality: N/A;    Family History  Problem Relation Age of Onset   Diabetes Sister    Colon cancer Maternal Aunt        greater than age 19   Breast cancer Cousin    Diabetes Daughter    Diabetes Daughter    Amblyopia Neg Hx    Blindness Neg Hx    Cataracts Neg Hx    Glaucoma Neg Hx    Macular degeneration Neg Hx    Retinal detachment Neg Hx    Strabismus Neg Hx    Retinitis pigmentosa Neg Hx    Rectal cancer Neg Hx    Stomach cancer Neg Hx    Colon polyps Neg Hx    Esophageal cancer Neg Hx     Social History   Occupational History   Occupation: retired  Tobacco Use   Smoking status: Former    Current packs/day: 0.00    Average packs/day: 0.5 packs/day for 55.0 years (27.5 ttl pk-yrs)    Types: Cigarettes    Start date: 03/19/1963    Quit date: 03/18/2018    Years since quitting: 5.6   Smokeless tobacco: Never   Tobacco comments:    smoked off and n  Vaping Use   Vaping status: Never Used  Substance and Sexual Activity   Alcohol use: No   Drug use: No   Sexual activity: Not Currently     Physical Exam: Blood pressure (!) 140/70, pulse 87, height 5\' 5"  (1.651 m), weight 198 lb 12.8 oz  (90.2 kg), SpO2 99%.  Gen:      No distress Lungs:  breath sounds diminished on the right, clear on the left CV:         RRR   Data Reviewed: Imaging: CT Chest March 2025 shows recurrent right sided pleural effusion  PFTs:     Latest Ref Rng & Units 03/15/2022   10:43 AM 05/11/2018    2:33 PM  PFT Results  FVC-Pre L 1.23    FVC-Predicted Pre % 41  71   FVC-Post L 1.13  1.77   FVC-Predicted Post % 38  73   Pre FEV1/FVC % % 80  83   Post FEV1/FCV % % 80  82   FEV1-Pre L 0.98  1.44   FEV1-Predicted Pre % 44  76   FEV1-Post L 0.91  1.46   DLCO uncorrected ml/min/mmHg 7.37  10.35   DLCO UNC% % 37  40   DLCO corrected ml/min/mmHg  7.37  12.65   DLCO COR %Predicted % 37  49   DLVA Predicted % 64  79   TLC L 3.62  4.30   TLC % Predicted % 69  82   RV % Predicted % 88  104    I have personally reviewed the patient's PFTs and show severe restriction to ventilation with reduced diffusion capacity.   Labs:  Immunization status: Immunization History  Administered Date(s) Administered   Fluad Quad(high Dose 65+) 05/31/2019, 09/12/2020   Influenza-Unspecified 04/16/2014   PFIZER(Purple Top)SARS-COV-2 Vaccination 11/22/2019, 12/17/2019   Pneumococcal Conjugate-13 06/10/2014   Pneumococcal Polysaccharide-23 04/19/2013    External Records Personally Reviewed: oncology, primary care.   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 Now worsened in the setting of right lung collapse Recurrent Right sided pleural effusion Emphysema, mild History of tobacco use, quit 2019 Chronic cough, likely reflux  Plan/Recommendations: Likely multifactorial dyspnea from chronic RLL collapse secondary to post - radiation changes, recurrent pleural effusion.   In the past with thoracentesis, right pleural effusion Cytology has already been negative x 3.  Bronchoscopy has been  negative for endobronchial malignancy.   We discussed pleurx catheter in the past but she was hesitant. She is now agreeable. She reports significant benefit from subsequent thoracentesis however is having pain with   She wants to proceed with additional thoracentesis and see if she has benefit. If she does have symptomatic benefit she would be ok with indwelling pleural catheter placement.   Discussed pleurodesis as an alternative but I doubt she would have a good outcome from this given high suspicion for pneumothorax ex vacuo. Low likelihood for success.  Will order pleurx catheter placement in IR. Will have PCCs arrange for home health for drainage; otherwise she will need to have teaching with family members.  Return to Care: Return in about 3 months (around 02/22/2024).   Durel Salts, MD Pulmonary and Critical Care Medicine [facility] Office:[phone]

## 2023-11-25 ENCOUNTER — Encounter: Payer: Self-pay | Admitting: Internal Medicine

## 2023-11-25 ENCOUNTER — Encounter (HOSPITAL_COMMUNITY): Payer: Self-pay | Admitting: Internal Medicine

## 2023-11-28 ENCOUNTER — Telehealth (HOSPITAL_COMMUNITY): Payer: Self-pay

## 2023-11-28 DIAGNOSIS — J90 Pleural effusion, not elsewhere classified: Secondary | ICD-10-CM

## 2023-11-28 NOTE — Telephone Encounter (Signed)
 Copied from CRM [id number]. Topic: General - Other >> Nov 28, 2023  9:38 AM Roseanne Cones wrote: Reason for CRM: Odilia Bennett from [facility] is calling to advise the office that she can get the patient scheduled for the  pleurx catheter placement in IR this week or next week, however, they need to confirm that Home health and nutrition has been arranged - they spoke with the patient and they have not received confirmation of arrangements. Please call Odilia Bennett with the confirmation and they will make the appointments for the patient.  Called and spoke with patient, she does not have any Home Health set up or Nutrition.  She has never used any home health services.  Advised her that I would let Odilia Bennett know so they can arrange.  Called IR at [facility] and spoke with Odilia Bennett, she states that they do not place the referrals for Home Health or Nutrition, the ordering physician will need to order these and have them in place prior to the pleurex being placed.  Dr. Dione Franks, Please advise if ok to place orders for Home Health and Nutrition.  Thank you.

## 2023-11-28 NOTE — Telephone Encounter (Signed)
 Called Dr. Rayne Callas office to let them know that I'm working on getting this patient scheduled for this week or next for her pleurx catheter placement. Home health and nutrition has to be set up prior to scheduling. Message sent to the referral coordinator to check on this and I should receive a call back with an update. AB

## 2023-11-30 ENCOUNTER — Other Ambulatory Visit: Payer: Self-pay | Admitting: Internal Medicine

## 2023-11-30 DIAGNOSIS — E1159 Type 2 diabetes mellitus with other circulatory complications: Secondary | ICD-10-CM

## 2023-11-30 DIAGNOSIS — J90 Pleural effusion, not elsewhere classified: Secondary | ICD-10-CM

## 2023-11-30 NOTE — Addendum Note (Signed)
 Addended by: Shelle Devon on: 11/30/2023 03:48 PM   Modules accepted: Orders

## 2023-12-07 ENCOUNTER — Encounter: Attending: Internal Medicine | Admitting: Nutrition

## 2023-12-07 ENCOUNTER — Other Ambulatory Visit: Payer: 59

## 2023-12-07 VITALS — BP 188/90

## 2023-12-07 DIAGNOSIS — E118 Type 2 diabetes mellitus with unspecified complications: Secondary | ICD-10-CM | POA: Insufficient documentation

## 2023-12-07 DIAGNOSIS — E1122 Type 2 diabetes mellitus with diabetic chronic kidney disease: Secondary | ICD-10-CM | POA: Diagnosis not present

## 2023-12-07 DIAGNOSIS — Z794 Long term (current) use of insulin: Secondary | ICD-10-CM | POA: Diagnosis not present

## 2023-12-08 ENCOUNTER — Telehealth: Payer: Self-pay

## 2023-12-08 ENCOUNTER — Telehealth: Payer: Self-pay | Admitting: Internal Medicine

## 2023-12-08 LAB — COMPREHENSIVE METABOLIC PANEL WITH GFR
AG Ratio: 1 (calc) (ref 1.0–2.5)
ALT: 15 U/L (ref 6–29)
AST: 17 U/L (ref 10–35)
Albumin: 3.8 g/dL (ref 3.6–5.1)
Alkaline phosphatase (APISO): 83 U/L (ref 37–153)
BUN/Creatinine Ratio: 21 (calc) (ref 6–22)
BUN: 32 mg/dL — ABNORMAL HIGH (ref 7–25)
CO2: 23 mmol/L (ref 20–32)
Calcium: 9.1 mg/dL (ref 8.6–10.4)
Chloride: 109 mmol/L (ref 98–110)
Creat: 1.49 mg/dL — ABNORMAL HIGH (ref 0.60–1.00)
Globulin: 3.7 g/dL (ref 1.9–3.7)
Glucose, Bld: 65 mg/dL (ref 65–99)
Potassium: 4.9 mmol/L (ref 3.5–5.3)
Sodium: 139 mmol/L (ref 135–146)
Total Bilirubin: 0.3 mg/dL (ref 0.2–1.2)
Total Protein: 7.5 g/dL (ref 6.1–8.1)
eGFR: 36 mL/min/{1.73_m2} — ABNORMAL LOW (ref >=60)

## 2023-12-08 LAB — MICROALBUMIN / CREATININE URINE RATIO
Creatinine, Urine: 101 mg/dL (ref 20–275)
Microalb Creat Ratio: 623 mg/g{creat} — ABNORMAL HIGH (ref <30)
Microalb, Ur: 62.9 mg/dL

## 2023-12-08 LAB — LIPID PANEL
Cholesterol: 135 mg/dL (ref <200)
HDL: 44 mg/dL — ABNORMAL LOW (ref >=50)
LDL Cholesterol (Calc): 77 mg/dL
Non-HDL Cholesterol (Calc): 91 mg/dL (ref <130)
Total CHOL/HDL Ratio: 3.1 (calc) (ref <5.0)
Triglycerides: 65 mg/dL (ref <150)

## 2023-12-08 LAB — HEMOGLOBIN A1C
Hgb A1c MFr Bld: 6.6 % — ABNORMAL HIGH (ref <5.7)
Mean Plasma Glucose: 143 mg/dL
eAG (mmol/L): 7.9 mmol/L

## 2023-12-08 NOTE — Telephone Encounter (Signed)
 Copied from CRM [id number]. Topic: General - Other >> Dec 08, 2023  9:42 AM Jenna [name] wrote: Reason for CRM: patient is calling to leave a message for the doctor  . Went to see linda the dietician  and linda told me to call back to let Dr Dione Franks know the temperature was 188/80 and 170/74  took high blood pressure medication and the 2 puffs of inhaler . Ms Jenna [name] say some inhalers make the blood pressure goes up or it could be every time I go to the doctor the blood pressure goes up . Ms Jenna [name] told patient to call and let the doctor know .   Please advise Dr Dione Franks

## 2023-12-08 NOTE — Telephone Encounter (Signed)
 Pt has been scheduled for a f/u with provider

## 2023-12-08 NOTE — Telephone Encounter (Signed)
**Note De-identified  [place] Obfuscation** Please advise 

## 2023-12-08 NOTE — Telephone Encounter (Signed)
 Copied from CRM [id number]. Topic: General - Other >> Dec 08, 2023  9:56 AM [name] wrote: Reason for CRM: patient is calling to leave a message for the doctor. Went to see Jenna [name] the dietician and Jenna [name] told her to call back to let Jenna [name] know the temperature was 188/80 and 170/74  took high blood pressure medication and the 2 puffs of inhaler . Jenna [name] said some inhalers make the blood pressure goes up or it could be every time I go to the doctor the blood pressure goes up. Jenna [name] told patient to call and let the doctor know.

## 2023-12-09 NOTE — Progress Notes (Signed)
 Patient was identified by name and DOB.  She is here for dietary review.  She is short of breath on walking, but appears in non distress when sitting and talking.  Ankles and feet are not swollen at this time.  Patient reports that she is waiting to go back to [facility] for "then to remove the fluid from my lungs, and are waiting on a prior auth for this.  It has been over a week and she was told to call the office to check on prior auth, since it usually takes less than 5 days.  She agreed to do this. BP was elevated and she was told to call MD office and notify them of this without delay.  She agreed to do this as well. Diet history:  7-9AM  up 9:30 Bfast: Ham sandwich with milk-8 ounces,  or Cheerios with banana or raisins 12-1  sandwich with diet pepsi, or milk,  "lots of grapes, or 2 oranges 6-7: beans with cornbread, veg.plate of 3-4 non starchy veg., and one starchy veg., with milk or water , or hamburger with water  10:30 chips and salsa or piece of banana bread., milk or water  Exercise: none Discussion: Stop all cold cereal and milk. Limit milk to 8 ounces at a time, and only twice a day-non at supper, or after Try to do some chair exercises for 5 minutes 3 x/day

## 2023-12-12 NOTE — Telephone Encounter (Signed)
 I would be surprised if this is all the inhalers. Please call her back and instruct her to check her blood pressure at home and keep a log for a few weeks. If it is still high while checking at home (over 140/90) then she needs to notify her PCP for follow up ( Dr Nicolette Barrio.)

## 2023-12-13 NOTE — Patient Instructions (Addendum)
 Stop all cold cereal and milk. Limit milk to 8 ounces at a time, and only twice a day-non at supper, or after Try to do chair exercises as tolerable for 5 minutes 2-3 X/day

## 2023-12-14 ENCOUNTER — Encounter: Payer: Self-pay | Admitting: "Endocrinology

## 2023-12-14 ENCOUNTER — Ambulatory Visit (INDEPENDENT_AMBULATORY_CARE_PROVIDER_SITE_OTHER): Payer: 59 | Admitting: "Endocrinology

## 2023-12-14 VITALS — BP 130/60 | HR 86 | Ht 65.0 in | Wt 197.0 lb

## 2023-12-14 DIAGNOSIS — Z794 Long term (current) use of insulin: Secondary | ICD-10-CM

## 2023-12-14 DIAGNOSIS — Z7984 Long term (current) use of oral hypoglycemic drugs: Secondary | ICD-10-CM | POA: Diagnosis not present

## 2023-12-14 DIAGNOSIS — E1122 Type 2 diabetes mellitus with diabetic chronic kidney disease: Secondary | ICD-10-CM

## 2023-12-14 DIAGNOSIS — E7800 Pure hypercholesterolemia, unspecified: Secondary | ICD-10-CM

## 2023-12-14 NOTE — Patient Instructions (Signed)

## 2023-12-14 NOTE — Progress Notes (Signed)
 Outpatient Endocrinology Note Jorge Newcomer, MD  12/14/23   Jenna [name] 11-02-1945 [id number]  Referring Provider: Adelia Homestead, * Primary Care Provider: Adelia Homestead, MD Reason for consultation: Subjective   Assessment & Plan  Diagnoses and all orders for this visit:  Type 2 diabetes mellitus with chronic kidney disease, with long-term current use of insulin , unspecified CKD stage [facility]) -     Ambulatory referral to Nephrology  Long term (current) use of oral hypoglycemic drugs  Long-term insulin  use (HCC)  Pure hypercholesterolemia   Diabetes complicated by neuropathy  Hba1c goal less than 7.0, current Hba1c is 6.6%. Will recommend the following: Lantus  30 units qam-skip if BG <100 Farxiga  10 mg every day Encouraged patient to check BG at home, gave log, staff helped putting her G7 on Discussed important of checking BG, skipping lantus  if BG <100   No known contraindications to any of above medications  Hyperlipidemia -Last LDL at goal: 74 -on pravastatin  20 mg QD -Follow low fat diet and exercise   -Blood pressure goal <140/90 - Microalbumin/creatinine off goal at 623 - not on ACE/ARB - taken off of Losartan  50 mg qd due to hyperkalemia requiring hospitalization  -diet changes including salt restriction -limit eating outside -counseled BP targets per standards of diabetes care -Uncontrolled blood pressure can lead to retinopathy, nephropathy and cardiovascular and atherosclerotic heart disease  Reviewed and counseled on: -A1C target -Blood sugar targets -Complications of uncontrolled diabetes  -Checking blood sugar before meals and bedtime and bring log next visit -All medications with mechanism of action and side effects -Hypoglycemia management: rule of 15's, Glucagon Emergency Kit and medical alert ID -low-carb low-fat plate-method diet -At least 20 minutes of physical activity per day -Annual dilated retinal eye exam  and foot exam -compliance and follow up needs -follow up as scheduled or earlier if problem gets worse  Call if blood sugar is less than 70 or consistently above 250    Take a 15 gm snack of carbohydrate at bedtime before you go to sleep if your blood sugar is less than 100.    If you are going to fast after midnight for a test or procedure, ask your physician for instructions on how to reduce/decrease your insulin  dose.    Call if blood sugar is less than 70 or consistently above 250  -Treating a low sugar by rule of 15  (15 gms of sugar every 15 min until sugar is more than 70) If you feel your sugar is low, test your sugar to be sure If your sugar is low (less than 70), then take 15 grams of a fast acting Carbohydrate (3-4 glucose tablets or glucose gel or 4 ounces of juice or regular soda) Recheck your sugar 15 min after treating low to make sure it is more than 70 If sugar is still less than 70, treat again with 15 grams of carbohydrate          Don't drive the hour of hypoglycemia  If unconscious/unable to eat or drink by mouth, use glucagon injection or nasal spray baqsimi and call 911. Can repeat again in 15 min if still unconscious.  Return in about 3 months (around 03/14/2024).   I have reviewed current medications, nurse's notes, allergies, vital signs, past medical and surgical history, family medical history, and social history for this encounter. Counseled patient on symptoms, examination findings, lab findings, imaging results, treatment decisions and monitoring and prognosis. The patient understood the recommendations and  agrees with the treatment plan. All questions regarding treatment plan were fully answered.  Jorge Newcomer, MD  12/14/23   History of Present Illness Sharlette Rowynn Kullberg is a 78 y.o. year old female who presents for follow up for Type 2 diabetes mellitus.  Verona Sinkfield was first diagnosed in 2000.   Diabetes education +  Home diabetes  regimen: Lantus  30 units qam Farxiga  10 mg qd  COMPLICATIONS -  MI/Stroke, + PAD -  retinopathy -  neuropathy +  nephropathy, Cr 1.5  BLOOD SUGAR DATA Not checking sugars  No dexcom on Can't remember the last time she checked   Physical Exam  BP 130/60   Pulse 86   Ht 5\' 5"  (1.651 m)   Wt 197 lb (89.4 kg)   SpO2 98%   BMI 32.78 kg/m    Constitutional: well developed, well nourished Head: normocephalic, atraumatic Eyes: sclera anicteric, no redness Neck: supple Lungs: normal respiratory effort Neurology: alert and oriented Skin: dry, no appreciable rashes Musculoskeletal: no appreciable defects Psychiatric: normal mood and affect  Current Medications Patient's Medications  New Prescriptions   No medications on file  Previous Medications   ACCU-CHEK SOFTCLIX LANCETS LANCETS    Use as instructed   ALBUTEROL  (VENTOLIN  HFA) 108 (90 BASE) MCG/ACT INHALER    Inhale 1-2 puffs into the lungs every 6 (six) hours as needed for wheezing or shortness of breath.   BLOOD GLUCOSE METER KIT AND SUPPLIES    Dispense based on patient and insurance preference. Use up to four times daily as directed. (FOR ICD-10 E10.9, E11.9).   BLOOD GLUCOSE METER KIT AND SUPPLIES KIT    Use up to four times daily as directed/accu check guide kit   CONTINUOUS GLUCOSE SENSOR (DEXCOM G7 SENSOR) MISC    1 Device by Does not apply route continuous.   DIPHENHYDRAMINE  (BENADRYL ) 2 % CREAM    Apply 1 application topically as needed for itching.   FARXIGA  10 MG TABS TABLET    Take 1 tablet (10 mg total) by mouth daily before breakfast. Follow-up appt due in Nov must see provider for future refills   FERROUS SULFATE  (IRON ) 325 (65 FE) MG TABS    Take 1 tablet (325 mg total) by mouth 2 (two) times daily with a meal.   FLUTICASONE  FUROATE-VILANTEROL (BREO ELLIPTA ) 200-25 MCG/ACT AEPB    Inhale 1 puff by mouth once daily   FUROSEMIDE  (LASIX ) 20 MG TABLET    Take 1 tablet (20 mg total) by mouth daily as needed.    GLUCOSE BLOOD (BLOOD GLUCOSE TEST STRIPS 333) STRP    1 strip by In Vitro route 4 (four) times daily.   INSULIN  GLARGINE (LANTUS  SOLOSTAR) 100 UNIT/ML SOLOSTAR PEN    Inject 34 Units into the skin daily. And 31G, 5mm pen needles 1/day   INSULIN  PEN NEEDLE 32G X 4 MM MISC    Inject 1 Needle into the skin every morning.   LEVOTHYROXINE  (SYNTHROID ) 75 MCG TABLET    Take 1 tablet by mouth once daily   MECLIZINE  (ANTIVERT ) 25 MG TABLET    Take 1 tablet (25 mg total) by mouth 3 (three) times daily as needed for dizziness.   METOPROLOL  SUCCINATE (TOPROL  XL) 25 MG 24 HR TABLET    Take 0.5 tablets (12.5 mg total) by mouth daily.   MULTIPLE VITAMIN (MULTIVITAMIN) CAPSULE    Take 1 capsule by mouth daily.   PRAVASTATIN  (PRAVACHOL ) 20 MG TABLET    Take 1 tablet (20 mg  total) by mouth daily.  Modified Medications   No medications on file  Discontinued Medications   No medications on file    Allergies Allergies  Allergen Reactions   Paclitaxel  Other (See Comments)    Unresponsiveness shortly after Taxol  inf started 06/12/18.   Aspirin Other (See Comments)    Stomach bleeding    Esomeprazole Magnesium     UNSPECIFIED REACTION    Losartan      AKI, hyperkalemia   Ace Inhibitors Other (See Comments)    Dizziness, drunk like    Past Medical History Past Medical History:  Diagnosis Date   Blood transfusion without reported diagnosis    Cataract    DM (diabetes mellitus) (HCC)    Dyspnea    GERD (gastroesophageal reflux disease)    Hyperlipidemia    Hypertension    Iron  deficiency anemia    NSCL ca dx'd 03/2018   Pneumonia    Thyroid  disease     Past Surgical History Past Surgical History:  Procedure Laterality Date   BIOPSY  12/09/2022   Procedure: BIOPSY;  Surgeon: Ace Holder, MD;  Location: [facility];  Service: Gastroenterology;;   BRONCHIAL BIOPSY  04/05/2023   Procedure: BRONCHIAL BIOPSIES;  Surgeon: Quillian Brunt, MD;  Location: [facility];  Service:  Cardiopulmonary;;   BRONCHIAL BRUSHINGS  04/05/2023   Procedure: BRONCHIAL BRUSHINGS;  Surgeon: Quillian Brunt, MD;  Location: [facility];  Service: Cardiopulmonary;;   BRONCHIAL WASHINGS  04/05/2023   Procedure: BRONCHIAL WASHINGS;  Surgeon: Quillian Brunt, MD;  Location: [facility];  Service: Cardiopulmonary;;   CATARACT EXTRACTION Right    CHOLECYSTECTOMY     COLONOSCOPY  10/24/2009   normal rectum/1X1cm abnormal lesion in the ascending colon (bx benign). TI normal for 10cm.  Prep difficult/inadequate. f/u TCS 09/2012 recommended   COLONOSCOPY  10/13/2004   Normal rectum/Diminutive polyps, splenic flexure, cold biopsied/removed.  Remainder of colonic mucosa appeared normal.   COLONOSCOPY N/A 12/14/2012   WGN:FAOZHYQ polyp-tubular adenoma   COLONOSCOPY WITH PROPOFOL  N/A 12/09/2022   Procedure: COLONOSCOPY WITH PROPOFOL ;  Surgeon: Ace Holder, MD;  Location: [facility];  Service: Gastroenterology;  Laterality: N/A;   ESOPHAGOGASTRODUODENOSCOPY  10/13/2004    Normal esophagus/ Nodular volcano like lesion in the antrum, either representing a  pancreatic rest or leiomyoma, biopsied.  Remainder of the gastric mucosa appeared normal, normal D1-D2   ESOPHAGOGASTRODUODENOSCOPY  10/24/2009   Benign biopsies. normal esophagus/small hiatal hernia/nodular lesion antrum/distal greater curvature. duodenal AVM s/p ablation   GIVENS CAPSULE STUDY  07/27/2010    multiple arteriovenous malformations which could definitely be the contributor to her drifting hemoglobin and hematocrit   HEMOSTASIS CONTROL  04/05/2023   Procedure: HEMOSTASIS CONTROL;  Surgeon: Quillian Brunt, MD;  Location: [facility];  Service: Cardiopulmonary;;   HOT HEMOSTASIS N/A 12/09/2022   Procedure: HOT HEMOSTASIS (ARGON PLASMA COAGULATION/BICAP);  Surgeon: Ace Holder, MD;  Location: [facility];  Service: Gastroenterology;  Laterality: N/A;   IR THORACENTESIS ASP PLEURAL SPACE W/IMG GUIDE  12/16/2020    POLYPECTOMY  12/09/2022   Procedure: POLYPECTOMY;  Surgeon: Ace Holder, MD;  Location: [facility];  Service: Gastroenterology;;   THORACENTESIS  04/05/2023   Procedure: THORACENTESIS;  Surgeon: Quillian Brunt, MD;  Location: [facility];  Service: Cardiopulmonary;;   THORACENTESIS Right 06/08/2023   Procedure: Antoine Kirsch;  Surgeon: Josiah Nigh, MD;  Location: [facility];  Service: Pulmonary;  Laterality: Right;   THORACENTESIS Right 08/29/2023   Procedure: THORACENTESIS;  Surgeon: Aleck Hurdle,  MD;  Location: [facility];  Service: Pulmonary;  Laterality: Right;   TUBAL LIGATION     US  ECHOCARDIOGRAPHY     VIDEO BRONCHOSCOPY N/A 04/05/2023   Procedure: VIDEO BRONCHOSCOPY WITHOUT FLUORO;  Surgeon: Quillian Brunt, MD;  Location: [facility];  Service: Cardiopulmonary;  Laterality: N/A;   VIDEO BRONCHOSCOPY WITH ENDOBRONCHIAL ULTRASOUND N/A 04/27/2018   Procedure: VIDEO BRONCHOSCOPY WITH ENDOBRONCHIAL ULTRASOUND;  Surgeon: Zelphia Higashi, MD;  Location: [facility] OR;  Service: Thoracic;  Laterality: N/A;    Family History family history includes Breast cancer in her cousin; Colon cancer in her maternal aunt; Diabetes in her daughter, daughter, and sister.  Social History Social History   Socioeconomic History   Marital status: Single    Spouse name: Not on file   Number of children: 3   Years of education: Not on file   Highest education level: Not on file  Occupational History   Occupation: retired  Tobacco Use   Smoking status: Former    Current packs/day: 0.00    Average packs/day: 0.5 packs/day for 55.0 years (27.5 ttl pk-yrs)    Types: Cigarettes    Start date: 03/19/1963    Quit date: 03/18/2018    Years since quitting: 5.7   Smokeless tobacco: Never   Tobacco comments:    smoked off and n  Vaping Use   Vaping status: Never Used  Substance and Sexual Activity   Alcohol  use: No   Drug use: No   Sexual activity: Not Currently  Other Topics  Concern   Not on file  Social History Narrative   Patient fully vaccinated   Social Drivers of Health   Financial Resource Strain: Low Risk  (09/30/2020)   Overall Financial Resource Strain (CARDIA)    Difficulty of Paying Living Expenses: Not hard at all  Food Insecurity: No Food Insecurity (08/08/2023)   Hunger Vital Sign    Worried About Running Out of Food in the Last Year: Never true    Ran Out of Food in the Last Year: Never true  Transportation Needs: No Transportation Needs (08/08/2023)   PRAPARE - Administrator, Civil Service (Medical): No    Lack of Transportation (Non-Medical): No  Physical Activity: Inactive (09/30/2020)   Exercise Vital Sign    Days of Exercise per Week: 0 days    Minutes of Exercise per Session: 0 min  Stress: No Stress Concern Present (09/30/2020)   [employer] of Occupational Health - Occupational Stress Questionnaire    Feeling of Stress : Not at all  Social Connections: Socially Isolated (09/30/2020)   Social Connection and Isolation Panel [NHANES]    Frequency of Communication with Friends and Family: More than three times a week    Frequency of Social Gatherings with Friends and Family: More than three times a week    Attends Religious Services: Never    Database administrator or Organizations: No    Attends Banker Meetings: Never    Marital Status: Never married  Intimate Partner Violence: Not At Risk (08/08/2023)   Humiliation, Afraid, Rape, and Kick questionnaire    Fear of Current or Ex-Partner: No    Emotionally Abused: No    Physically Abused: No    Sexually Abused: No    Lab Results  Component Value Date   HGBA1C 6.6 (H) 12/07/2023   HGBA1C 6.1 (A) 09/12/2023   HGBA1C 6.3 (A) 05/18/2023   Lab Results  Component Value Date   CHOL 135  12/07/2023   Lab Results  Component Value Date   HDL 44 (L) 12/07/2023   Lab Results  Component Value Date   LDLCALC 77 12/07/2023   Lab Results  Component  Value Date   TRIG 65 12/07/2023   Lab Results  Component Value Date   CHOLHDL 3.1 12/07/2023   Lab Results  Component Value Date   CREATININE 1.49 (H) 12/07/2023   Lab Results  Component Value Date   GFR 33.19 (L) 09/20/2023   Lab Results  Component Value Date   MICROALBUR 62.9 12/07/2023      Component Value Date/Time   NA 139 12/07/2023 0811   K 4.9 12/07/2023 0811   CL 109 12/07/2023 0811   CO2 23 12/07/2023 0811   GLUCOSE 65 12/07/2023 0811   BUN 32 (H) 12/07/2023 0811   BUN 24 (A) 02/12/2020 0000   CREATININE 1.49 (H) 12/07/2023 0811   CALCIUM  9.1 12/07/2023 0811   PROT 7.5 12/07/2023 0811   ALBUMIN 3.8 11/10/2023 1056   ALBUMIN 4.3 12/10/2011 0919   AST 17 12/07/2023 0811   AST 24 11/10/2023 1056   ALT 15 12/07/2023 0811   ALT 19 11/10/2023 1056   ALKPHOS 86 11/10/2023 1056   ALKPHOS 88 12/10/2011 0919   BILITOT 0.3 12/07/2023 0811   BILITOT 0.3 11/10/2023 1056   GFRNONAA 30 (L) 11/10/2023 1056   GFRAA 43 (L) 02/12/2020 0941      Latest Ref Rng & Units 12/07/2023    8:11 AM 11/10/2023   10:56 AM 09/20/2023    2:17 PM  BMP  Glucose 65 - 99 mg/dL 65  161  096   BUN 7 - 25 mg/dL 32  32  31   Creatinine 0.60 - 1.00 mg/dL 0.45  4.09  8.11   BUN/Creat Ratio 6 - 22 (calc) 21     Sodium 135 - 146 mmol/L 139  141  142   Potassium 3.5 - 5.3 mmol/L 4.9  4.8  4.8   Chloride 98 - 110 mmol/L 109  111  110   CO2 20 - 32 mmol/L 23  24  23    Calcium  8.6 - 10.4 mg/dL 9.1  9.3  9.3        Component Value Date/Time   WBC 4.3 11/10/2023 1056   WBC 6.1 09/20/2023 1417   RBC 4.30 11/10/2023 1056   HGB 11.6 (L) 11/10/2023 1056   HGB 8.1 (L) 12/27/2017 1123   HCT 37.7 11/10/2023 1056   HCT 24.4 (L) 12/27/2017 1123   PLT 174 11/10/2023 1056   PLT 296 12/27/2017 1123   MCV 87.7 11/10/2023 1056   MCV 70 (L) 12/27/2017 1123   MCH 27.0 11/10/2023 1056   MCHC 30.8 11/10/2023 1056   RDW 15.2 11/10/2023 1056   RDW 17.0 (H) 12/27/2017 1123   LYMPHSABS 0.6 (L) 11/10/2023  1056   LYMPHSABS 0.8 12/27/2017 1123   MONOABS 0.3 11/10/2023 1056   EOSABS 0.1 11/10/2023 1056   EOSABS 0.1 12/27/2017 1123   BASOSABS 0.0 11/10/2023 1056   BASOSABS 0.0 12/27/2017 1123     Parts of this note may have been dictated using voice recognition software. There may be variances in spelling and vocabulary which are unintentional. Not all errors are proofread. Please notify the [facility] if any discrepancies are noted or if the meaning of any statement is not clear.

## 2023-12-15 ENCOUNTER — Telehealth (HOSPITAL_COMMUNITY): Payer: Self-pay

## 2023-12-15 ENCOUNTER — Telehealth: Payer: Self-pay | Admitting: *Deleted

## 2023-12-15 NOTE — Telephone Encounter (Signed)
Called to schedule pleurx catheter, no answer, left vm. AB

## 2023-12-15 NOTE — Telephone Encounter (Signed)
 Clarified orders for draining of pleurx catheter with Dr. Dione Franks over secure chat.  Forms and orders faxed to [facility].    Jenna [name], Coordinator with IR has called and left a VM for patient to get her scheduled for pleurx catheter placement next week.

## 2023-12-15 NOTE — Telephone Encounter (Signed)
 Fax went through to [place] [phone], received confirmation fax.   Nothing further needed.

## 2023-12-19 ENCOUNTER — Telehealth: Payer: Self-pay | Admitting: *Deleted

## 2023-12-19 NOTE — Telephone Encounter (Signed)
 Received [employer] form back from [place] requesting further information (date of placement of drain).  No one was available to speak to in IR, I was told that [facility] [phone]) would be available on Tuesday.  Advised I would call back then to find out where we are getting the patient scheduled.

## 2023-12-20 ENCOUNTER — Telehealth: Payer: Self-pay | Admitting: *Deleted

## 2023-12-20 ENCOUNTER — Other Ambulatory Visit (HOSPITAL_COMMUNITY): Payer: Self-pay | Admitting: Internal Medicine

## 2023-12-20 DIAGNOSIS — C342 Malignant neoplasm of middle lobe, bronchus or lung: Secondary | ICD-10-CM

## 2023-12-20 NOTE — Telephone Encounter (Signed)
 Patient has not had the Pleurx catheter placed.  Working with IR to get this done soon.  She is scheduled to f/u with Dr. Dione Franks on 5/9.  Discussed with Dr. Dione Franks whether this visit needs to be rescheduled.  The patient needs to be rescheduled for 1 month out and cancel the appointment for 5/9.  Front staff, Please call and reschedule patient for 1 month out and cancel appointment for 5/9.  There is no need for her to be seen prior to her drain placement.  Thank you.

## 2023-12-20 NOTE — Telephone Encounter (Signed)
 Left pt message on AM to notify

## 2023-12-20 NOTE — Telephone Encounter (Signed)
 I called [employer] at [phone] and spoke with Adell Hones C.  She verified that the patient does have home health benefits and her deductible has been met.  She has a maximum out of pocket of $9,350.  She has a valid CPT code of 09811.  Message sent to [facility] to get [facility] sent up with in network company.  Message sent to [facility] with IR to let her know progress.

## 2023-12-22 ENCOUNTER — Telehealth: Payer: Self-pay | Admitting: *Deleted

## 2023-12-22 NOTE — Telephone Encounter (Signed)
 Awaiting the home health to be set up so IR can schedule the Pleurx cather insertion.  After this scheduled, I can refax the form with the insertion date and start date on the form.  Will await out come of review by [facility].

## 2023-12-22 NOTE — Telephone Encounter (Signed)
 Called [place] with IR [phone]), advised that the PCCs have determined that Jenna [name] is in network for the patient and they are requesting information to determine if they can see the patient. Advised I would let her know when we have a definitive answer.  She verbalized understanding.

## 2023-12-23 ENCOUNTER — Encounter: Payer: Self-pay | Admitting: Internal Medicine

## 2023-12-23 ENCOUNTER — Ambulatory Visit (INDEPENDENT_AMBULATORY_CARE_PROVIDER_SITE_OTHER): Admitting: Internal Medicine

## 2023-12-23 VITALS — BP 142/60 | HR 89 | Temp 98.3°F | Ht 65.0 in | Wt 198.2 lb

## 2023-12-23 DIAGNOSIS — I10 Essential (primary) hypertension: Secondary | ICD-10-CM | POA: Diagnosis not present

## 2023-12-23 DIAGNOSIS — E118 Type 2 diabetes mellitus with unspecified complications: Secondary | ICD-10-CM

## 2023-12-23 DIAGNOSIS — E11649 Type 2 diabetes mellitus with hypoglycemia without coma: Secondary | ICD-10-CM

## 2023-12-23 DIAGNOSIS — Z0001 Encounter for general adult medical examination with abnormal findings: Secondary | ICD-10-CM

## 2023-12-23 DIAGNOSIS — E1169 Type 2 diabetes mellitus with other specified complication: Secondary | ICD-10-CM

## 2023-12-23 DIAGNOSIS — Z0000 Encounter for general adult medical examination without abnormal findings: Secondary | ICD-10-CM

## 2023-12-23 DIAGNOSIS — I700 Atherosclerosis of aorta: Secondary | ICD-10-CM | POA: Diagnosis not present

## 2023-12-23 DIAGNOSIS — C342 Malignant neoplasm of middle lobe, bronchus or lung: Secondary | ICD-10-CM

## 2023-12-23 DIAGNOSIS — E785 Hyperlipidemia, unspecified: Secondary | ICD-10-CM

## 2023-12-23 MED ORDER — FLUTICASONE FUROATE-VILANTEROL 200-25 MCG/ACT IN AEPB: 1.0000 | INHALATION_SPRAY | Freq: Every day | RESPIRATORY_TRACT | 3 refills | Status: AC

## 2023-12-23 MED ORDER — PRAVASTATIN SODIUM 20 MG PO TABS: 20.0000 mg | ORAL_TABLET | Freq: Every day | ORAL | 3 refills | Status: AC

## 2023-12-23 MED ORDER — LEVOTHYROXINE SODIUM 75 MCG PO TABS
75.0000 ug | ORAL_TABLET | Freq: Every day | ORAL | 3 refills | Status: AC
Start: 2023-12-23 — End: ?

## 2023-12-23 MED ORDER — ALBUTEROL SULFATE HFA 108 (90 BASE) MCG/ACT IN AERS: 1.0000 | INHALATION_SPRAY | Freq: Four times a day (QID) | RESPIRATORY_TRACT | 2 refills | Status: AC | PRN

## 2023-12-23 MED ORDER — FARXIGA 10 MG PO TABS: 10.0000 mg | ORAL_TABLET | Freq: Every day | ORAL | 3 refills | Status: AC

## 2023-12-23 MED ORDER — METOPROLOL SUCCINATE ER 25 MG PO TB24
12.5000 mg | ORAL_TABLET | Freq: Every day | ORAL | 3 refills | Status: AC
Start: 2023-12-23 — End: ?

## 2023-12-23 MED ORDER — FUROSEMIDE 20 MG PO TABS: 20.0000 mg | ORAL_TABLET | Freq: Every day | ORAL | 3 refills | Status: DC | PRN

## 2023-12-23 MED ORDER — LANTUS SOLOSTAR 100 UNIT/ML ~~LOC~~ SOPN: 34.0000 [IU] | PEN_INJECTOR | Freq: Every day | SUBCUTANEOUS | 11 refills | Status: AC

## 2023-12-23 NOTE — Assessment & Plan Note (Signed)
Flu shot yearly. Pneumonia complete. Shingrix complete. Tetanus due at pharmacy. Colonoscopy aged out. Mammogram aged out, pap smear aged out and dexa complete. Counseled about sun safety and mole surveillance. Counseled about the dangers of distracted driving. Given 10 year screening recommendations.

## 2023-12-23 NOTE — Assessment & Plan Note (Signed)
 Continue statin daily. Recent lipid panel up to date.

## 2023-12-23 NOTE — Assessment & Plan Note (Signed)
 Refilled albuterol  and continue breo.

## 2023-12-23 NOTE — Assessment & Plan Note (Signed)
 BP at goal on metoprolol  and refilled today uses lasix  prn.

## 2023-12-23 NOTE — Assessment & Plan Note (Signed)
 Recent lipid panel up to date continue statin.

## 2023-12-23 NOTE — Progress Notes (Signed)
   Subjective:   Patient ID: Jenna [name], female    DOB: Nov 09, 1945, 78 y.o.   MRN: [id number]  HPI The patient is a 78 YO female coming in physical.  PMH, FMH, social history reviewed and updated  Review of Systems  Constitutional: Negative.   HENT: Negative.    Eyes: Negative.   Respiratory:  Positive for shortness of breath. Negative for cough and chest tightness.        Chronic  Cardiovascular:  Negative for chest pain, palpitations and leg swelling.  Gastrointestinal:  Negative for abdominal distention, abdominal pain, constipation, diarrhea, nausea and vomiting.  Musculoskeletal: Negative.   Skin: Negative.   Neurological: Negative.   Psychiatric/Behavioral: Negative.      Objective:  Physical Exam Constitutional:      Appearance: She is well-developed.  HENT:     Head: Normocephalic and atraumatic.  Cardiovascular:     Rate and Rhythm: Normal rate and regular rhythm.  Pulmonary:     Effort: Pulmonary effort is normal. No respiratory distress.     Breath sounds: Normal breath sounds. No wheezing or rales.  Abdominal:     General: Bowel sounds are normal. There is no distension.     Palpations: Abdomen is soft.     Tenderness: There is no abdominal tenderness. There is no rebound.  Musculoskeletal:     Cervical back: Normal range of motion.  Skin:    General: Skin is warm and dry.  Neurological:     Mental Status: She is alert and oriented to person, place, and time.     Coordination: Coordination normal.     Vitals:   12/23/23 1126  BP: (!) 142/60  Pulse: 89  Temp: 98.3 F (36.8 C)  SpO2: 96%  Weight: 198 lb 3.2 oz (89.9 kg)  Height: 5\' 5"  (1.651 m)    Assessment & Plan:

## 2023-12-23 NOTE — Assessment & Plan Note (Signed)
 Recent labs at goal continue farxiga  and lantus . On statin.

## 2023-12-26 ENCOUNTER — Ambulatory Visit

## 2023-12-27 ENCOUNTER — Other Ambulatory Visit: Payer: Self-pay | Admitting: *Deleted

## 2023-12-27 ENCOUNTER — Telehealth: Payer: Self-pay | Admitting: *Deleted

## 2023-12-27 DIAGNOSIS — R0602 Shortness of breath: Secondary | ICD-10-CM

## 2023-12-27 DIAGNOSIS — J90 Pleural effusion, not elsewhere classified: Secondary | ICD-10-CM

## 2023-12-27 DIAGNOSIS — Z85118 Personal history of other malignant neoplasm of bronchus and lung: Secondary | ICD-10-CM

## 2023-12-27 DIAGNOSIS — R053 Chronic cough: Secondary | ICD-10-CM

## 2023-12-27 DIAGNOSIS — J701 Chronic and other pulmonary manifestations due to radiation: Secondary | ICD-10-CM

## 2023-12-27 NOTE — Telephone Encounter (Signed)
 Home Health Skilled nursing order and demographics faxed to [facility] at [phone].  Message sent to Janel Medford at IR [facility]) and Michaelle Adolphus [facility]), giving them an update on status of Home health orders and requesting an insertion date for Pleurx catheter so supply orders can be re sent to [facility], they require an insertion date and start date.

## 2023-12-28 ENCOUNTER — Telehealth (HOSPITAL_COMMUNITY): Payer: Self-pay

## 2023-12-28 ENCOUNTER — Telehealth: Payer: Self-pay

## 2023-12-28 NOTE — Telephone Encounter (Signed)
 FYI

## 2023-12-28 NOTE — Telephone Encounter (Signed)
 Copied from CRM [id number]. Topic: Clinical - Home Health Verbal Orders >> Dec 28, 2023  1:07 PM Adonis Hoot wrote: Caller/Agency: [facility] Callback Number: [phone]  Any new concerns about the patient? Yes Janalyn Me from [facility] called stating that  [facility] is only allowing 12 visits per 60 days for home health,and patient is requiring more care than that. They will have to decline the referral that was sent to them

## 2023-12-28 NOTE — Telephone Encounter (Signed)
 ----- Message from Aleck Hurdle sent at 11/30/2023  3:31 PM EDT ----- Regarding: RE: Plurex Cath Thanks I'm just working on making sure I can get home health and nutrition arranged for her first ----- Message ----- From: Vito Grippe Sent: 11/25/2023   2:34 PM EDT To: Gwenlyn Lento, MD Subject: FW: Plurex Cath                                Dr.Desai,  We will get her scheduled for Plurex, next available at [facility] or [facility].  Will you be managing this so I can fax the [facility] forms over, I just need your fax number.  Thanks, Tiffany ----- Message ----- From: Roxie Cord, MD Sent: 11/25/2023   2:18 PM EDT To: Vito Grippe; Aleck Hurdle, MD Subject: RE: Plurex Cath                                Very helpful, thank you for that insight, Dr. Dione Franks.  For some reason when I pull her up I don't see those procedures, the only one I can see is from 12/16/20.  It sounds like you have worked her up very thoroughly and that PleurX is indicated.   Tiffany, please schedule for right sided PleurX with sedation.   HKM ----- Message ----- From: Aleck Hurdle, MD Sent: 11/25/2023   1:36 PM EDT To: Roxie Cord, MD; Vito Grippe Subject: RE: Plurex Cath                                Hi Dr. Marne Sings, Thanks for your review. The patient has had ultrasound guided thoracentesis 3 times in the last year - August 2024, October 2024 and January 2025 all done at [facility] or [facility] - hopefully you're able to see the pulmonary procedure notes. All large volume, all with perceived benefit. She has a history of lung cancer s/p chemo and radiation from several years ago and I even have done a repeat bronchoscopy of the RLL for which biopsies have been negative for malignancy. I suspect she has some radiation related airway changes causing RLL atelectasis plus this recurrent effusion. I have spent a lot of time evaluating with my colleagues and the patient as well as with  oncology the optimal solution here. I do not think she would be a good candidate for tube pleurodesis because she does have a tiny pneumothorax ex vacuo and I suspect would not have good pleural adhesion. I could keep doing serial thoracentesis but I figured at 3 with recurrence a pleurx would be a safe bet. I am very open to suggestions if you have any on how best to manage this patient. Of course if you still feel strongly a pleurx is a bad idea I will just keep doing serial thoracentesis - its just inconvenient for the patient.   ND ----- Message ----- From: Vito Grippe Sent: 11/25/2023   1:06 PM EDT To: Aleck Hurdle, MD Subject: FW: Plurex Cath                                Dr.Desai,  Please see below in regards to Plurex Cath order we received, Dr.McCullough reviewed.  Thanks, Jyl [facility] IR Scheduler [facility] [phone] ----- Message ----- From: Roxie Cord, MD Sent: 11/25/2023  12:29 PM EDT To: Vito Grippe Subject: RE: Plurex Cath                                This patient hasn't had a thoracentesis in 3 years and this effusion is very chronic.  She needs to have a routine thoracentesis first to see if her lung even expands and her symptoms improve prior to considering a pleurX.  If she has had thora outside our system more recently, then we need to know dates, volume and if she had clinical improvement.   HKM ----- Message ----- From: Vito Grippe Sent: 11/25/2023  11:34 AM EDT To: Faye Hoops; Ir Procedure Requests Subject: Plurex Cath                                    PROCEDURE: PLEURAL PLUREX CATH PLACEMENT  REASON: RECURRENT PLEURAL EFFUSION (RT)  HISTORY: CT CHEST 11/12/23 (PER NOTE HAS HAD THORA'S, NOT WITH US  MAYBE IN OFC)  PHYSICIAN: DESAI, NIKITA  CONTACT#[phone]

## 2023-12-29 NOTE — Telephone Encounter (Signed)
 Patient scheduled for Pleurx catheter placement in IR at [facility] on 01/03/24 per Janel Medford.  Orders for supplies faxed to [facility] ([facility]-[phone]).

## 2023-12-29 NOTE — Telephone Encounter (Signed)
 Patient is scheduled for 01/03/24 in IR at [facility].  Paperwork has been faxed to [facility] for supplies and Home health is being handled by the [facility] health).  Order and Demo was received by [facility] and I should know if they will accept her tomorrow per intake rep (per [place], [facility]).

## 2023-12-30 ENCOUNTER — Other Ambulatory Visit: Payer: Self-pay | Admitting: Radiology

## 2024-01-01 ENCOUNTER — Encounter (HOSPITAL_COMMUNITY): Payer: Self-pay

## 2024-01-01 NOTE — H&P (Signed)
 Chief Complaint: Recurrent Right Sided Pleural Effusion. Request is for Right Sided Thoracic PleurX Placement  Referring Physician(s): Desai,Nikita S  Supervising Physician: Alyssa Jumper  Patient Status: [facility] - Out-pt  History of Present Illness: Jenna [name] is a 78 y.o. femaleoutpatient. History of HTN, HLD, GERD, DM, non small cell lung cancer right s/p chemotherapy and radiation. Found to have a recurrent right sided thoracenteiss (August 2024, October 2024 and January 2025) Thought to be radiation related airway changes causing RLL atelectasis plus this recurrent effusion. Per note from Dr. Louie Rover dated 4.11.25 I do not think she would be a good candidate for tube pleurodesis because she does have a tiny pneumothorax ex vacuo and I suspect would not have good pleural adhesion. Team is requesting a right sided thoracic pleurX. Case reviewed and approved by IR Attending Dr. Fernando Hoyer.   Currently without any significant complaints. Patient alert and laying in bed,calm. Denies any fevers, headache, chest pain, SOB, cough, abdominal pain, nausea, vomiting or bleeding.    All labs and medications are within acceptable parameters.No pertinent allergies. Patient has been NPO since midnight.   Return precautions and treatment recommendations and follow-up discussed with the patient  who is agreeable with the plan.      Past Medical History:  Diagnosis Date   Blood transfusion without reported diagnosis    Cataract    DM (diabetes mellitus) (HCC)    Dyspnea    GERD (gastroesophageal reflux disease)    Hyperlipidemia    Hypertension    Iron  deficiency anemia    NSCL ca dx'd 03/2018   Pneumonia    Thyroid  disease     Past Surgical History:  Procedure Laterality Date   BIOPSY  12/09/2022   Procedure: BIOPSY;  Surgeon: Ace Holder, MD;  Location: [facility];  Service: Gastroenterology;;   BRONCHIAL BIOPSY  04/05/2023   Procedure: BRONCHIAL  BIOPSIES;  Surgeon: Quillian Brunt, MD;  Location: [facility];  Service: Cardiopulmonary;;   BRONCHIAL BRUSHINGS  04/05/2023   Procedure: BRONCHIAL BRUSHINGS;  Surgeon: Quillian Brunt, MD;  Location: [facility];  Service: Cardiopulmonary;;   BRONCHIAL WASHINGS  04/05/2023   Procedure: BRONCHIAL WASHINGS;  Surgeon: Quillian Brunt, MD;  Location: [facility]OSCOPY;  Service: Cardiopulmonary;;   CATARACT EXTRACTION Right    CHOLECYSTECTOMY     COLONOSCOPY  10/24/2009   normal rectum/1X1cm abnormal lesion in the ascending colon (bx benign). TI normal for 10cm.  Prep difficult/inadequate. f/u TCS 09/2012 recommended   COLONOSCOPY  10/13/2004   Normal rectum/Diminutive polyps, splenic flexure, cold biopsied/removed.  Remainder of colonic mucosa appeared normal.   COLONOSCOPY N/A 12/14/2012   ZOX:WRUEAVW polyp-tubular adenoma   COLONOSCOPY WITH PROPOFOL  N/A 12/09/2022   Procedure: COLONOSCOPY WITH PROPOFOL ;  Surgeon: Ace Holder, MD;  Location: [facility];  Service: Gastroenterology;  Laterality: N/A;   ESOPHAGOGASTRODUODENOSCOPY  10/13/2004    Normal esophagus/ Nodular volcano like lesion in the antrum, either representing a  pancreatic rest or leiomyoma, biopsied.  Remainder of the gastric mucosa appeared normal, normal D1-D2   ESOPHAGOGASTRODUODENOSCOPY  10/24/2009   Benign biopsies. normal esophagus/small hiatal hernia/nodular lesion antrum/distal greater curvature. duodenal AVM s/p ablation   GIVENS CAPSULE STUDY  07/27/2010    multiple arteriovenous malformations which could definitely be the contributor to her drifting hemoglobin and hematocrit   HEMOSTASIS CONTROL  04/05/2023   Procedure: HEMOSTASIS CONTROL;  Surgeon: Quillian Brunt, MD;  Location: [facility];  Service: Cardiopulmonary;;   HOT HEMOSTASIS N/A 12/09/2022  Procedure: HOT HEMOSTASIS (ARGON PLASMA COAGULATION/BICAP);  Surgeon: Ace Holder, MD;  Location: [facility];  Service: Gastroenterology;   Laterality: N/A;   IR THORACENTESIS ASP PLEURAL SPACE W/IMG GUIDE  12/16/2020   POLYPECTOMY  12/09/2022   Procedure: POLYPECTOMY;  Surgeon: Ace Holder, MD;  Location: [facility];  Service: Gastroenterology;;   THORACENTESIS  04/05/2023   Procedure: THORACENTESIS;  Surgeon: Quillian Brunt, MD;  Location: [facility];  Service: Cardiopulmonary;;   THORACENTESIS Right 06/08/2023   Procedure: Antoine Kirsch;  Surgeon: Josiah Nigh, MD;  Location: [facility] ENDOSCOPY;  Service: Pulmonary;  Laterality: Right;   THORACENTESIS Right 08/29/2023   Procedure: THORACENTESIS;  Surgeon: Aleck Hurdle, MD;  Location: [facility] ENDOSCOPY;  Service: Pulmonary;  Laterality: Right;   TUBAL LIGATION     US  ECHOCARDIOGRAPHY     VIDEO BRONCHOSCOPY N/A 04/05/2023   Procedure: VIDEO BRONCHOSCOPY WITHOUT FLUORO;  Surgeon: Quillian Brunt, MD;  Location: [facility];  Service: Cardiopulmonary;  Laterality: N/A;   VIDEO BRONCHOSCOPY WITH ENDOBRONCHIAL ULTRASOUND N/A 04/27/2018   Procedure: VIDEO BRONCHOSCOPY WITH ENDOBRONCHIAL ULTRASOUND;  Surgeon: Zelphia Higashi, MD;  Location: [facility] OR;  Service: Thoracic;  Laterality: N/A;    Allergies: Paclitaxel , Aspirin, Esomeprazole magnesium, Losartan , and Ace inhibitors  Medications: Prior to Admission medications   Medication Sig Start Date End Date Taking? Authorizing Provider  Accu-Chek Softclix Lancets lancets Use as instructed 06/16/23   Jorge Newcomer, MD  albuterol  (VENTOLIN  HFA) 108 (90 Base) MCG/ACT inhaler Inhale 1-2 puffs into the lungs every 6 (six) hours as needed for wheezing or shortness of breath. 12/23/23   Adelia Homestead, MD  blood glucose meter kit and supplies KIT Use up to four times daily as directed/accu check guide kit 06/22/23   Jorge Newcomer, MD  blood glucose meter kit and supplies Dispense based on patient and insurance preference. Use up to four times daily as directed. (FOR ICD-10 E10.9, E11.9). 06/16/23   Motwani, Komal, MD   Continuous Glucose Sensor (DEXCOM G7 SENSOR) MISC 1 Device by Does not apply route continuous. 01/27/23   Jorge Newcomer, MD  diphenhydrAMINE  (BENADRYL ) 2 % cream Apply 1 application topically as needed for itching.    [provider]  FARXIGA  10 MG TABS tablet Take 1 tablet (10 mg total) by mouth daily before breakfast. Follow-up appt due in Nov must see provider for future refills 12/23/23   Adelia Homestead, MD  Ferrous Sulfate  (IRON ) 325 (65 Fe) MG TABS Take 1 tablet (325 mg total) by mouth 2 (two) times daily with a meal. 12/25/20   Cirigliano, Kathrine Paris, DO  fluticasone  furoate-vilanterol (BREO ELLIPTA ) 200-25 MCG/ACT AEPB Inhale 1 puff into the lungs daily. 12/23/23   Adelia Homestead, MD  furosemide  (LASIX ) 20 MG tablet Take 1 tablet (20 mg total) by mouth daily as needed. 12/23/23   Adelia Homestead, MD  Glucose Blood (BLOOD GLUCOSE TEST STRIPS 333) STRP 1 strip by In Vitro route 4 (four) times daily. 06/20/23   Motwani, Komal, MD  insulin  glargine (LANTUS  SOLOSTAR) 100 UNIT/ML Solostar Pen Inject 34 Units into the skin daily. And 31G, 5mm pen needles 1/day 12/23/23   Adelia Homestead, MD  Insulin  Pen Needle 32G X 4 MM MISC Inject 1 Needle into the skin every morning. 07/07/23   Motwani, Komal, MD  levothyroxine  (SYNTHROID ) 75 MCG tablet Take 1 tablet (75 mcg total) by mouth daily. 12/23/23   Adelia Homestead, MD  meclizine  (ANTIVERT ) 25 MG tablet Take 1 tablet (25  mg total) by mouth 3 (three) times daily as needed for dizziness. 05/21/23   Alissa April, MD  metoprolol  succinate (TOPROL  XL) 25 MG 24 hr tablet Take 0.5 tablets (12.5 mg total) by mouth daily. 12/23/23   Adelia Homestead, MD  Multiple Vitamin (MULTIVITAMIN) capsule Take 1 capsule by mouth daily.    [provider]  pravastatin  (PRAVACHOL ) 20 MG tablet Take 1 tablet (20 mg total) by mouth daily. 12/23/23   Adelia Homestead, MD     Family History  Problem Relation Age of Onset   Diabetes Sister     Colon cancer Maternal Aunt        greater than age 4   Breast cancer Cousin    Diabetes Daughter    Diabetes Daughter    Amblyopia Neg Hx    Blindness Neg Hx    Cataracts Neg Hx    Glaucoma Neg Hx    Macular degeneration Neg Hx    Retinal detachment Neg Hx    Strabismus Neg Hx    Retinitis pigmentosa Neg Hx    Rectal cancer Neg Hx    Stomach cancer Neg Hx    Colon polyps Neg Hx    Esophageal cancer Neg Hx     Social History   Socioeconomic History   Marital status: Single    Spouse name: Not on file   Number of children: 3   Years of education: Not on file   Highest education level: Not on file  Occupational History   Occupation: retired  Tobacco Use   Smoking status: Former    Current packs/day: 0.00    Average packs/day: 0.5 packs/day for 55.0 years (27.5 ttl pk-yrs)    Types: Cigarettes    Start date: 03/19/1963    Quit date: 03/18/2018    Years since quitting: 5.8   Smokeless tobacco: Never   Tobacco comments:    smoked off and n  Vaping Use   Vaping status: Never Used  Substance and Sexual Activity   Alcohol  use: No   Drug use: No   Sexual activity: Not Currently  Other Topics Concern   Not on file  Social History Narrative   Patient fully vaccinated   Social Drivers of Health   Financial Resource Strain: Low Risk  (09/30/2020)   Overall Financial Resource Strain (CARDIA)    Difficulty of Paying Living Expenses: Not hard at all  Food Insecurity: No Food Insecurity (08/08/2023)   Hunger Vital Sign    Worried About Running Out of Food in the Last Year: Never true    Ran Out of Food in the Last Year: Never true  Transportation Needs: No Transportation Needs (08/08/2023)   PRAPARE - Administrator, Civil Service (Medical): No    Lack of Transportation (Non-Medical): No  Physical Activity: Inactive (09/30/2020)   Exercise Vital Sign    Days of Exercise per Week: 0 days    Minutes of Exercise per Session: 0 min  Stress: No Stress Concern  Present (09/30/2020)   [employer] of Occupational Health - Occupational Stress Questionnaire    Feeling of Stress : Not at all  Social Connections: Socially Isolated (09/30/2020)   Social Connection and Isolation Panel [NHANES]    Frequency of Communication with Friends and Family: More than three times a week    Frequency of Social Gatherings with Friends and Family: More than three times a week    Attends Religious Services: Never    Active Member of  Clubs or Organizations: No    Attends Banker Meetings: Never    Marital Status: Never married    Review of Systems: A 12 point ROS discussed and pertinent positives are indicated in the HPI above.  All other systems are negative.  Review of Systems  Constitutional:  Negative for fatigue and fever.  HENT:  Negative for congestion.   Respiratory:  Negative for cough and shortness of breath.   Gastrointestinal:  Negative for abdominal pain, diarrhea, nausea and vomiting.    Vital Signs: BP (!) 158/83   Pulse 86   Temp 98.8 F (37.1 C)   Resp (!) 22   Ht 5\' 5"  (1.651 m)   Wt 198 lb (89.8 kg)   SpO2 98%   BMI 32.95 kg/m   Advance Care Plan: The advanced care plan/surrogate decision maker was discussed at the time of visit and the patient did not wish to discuss or was not able to name a surrogate decision maker or provide an advance care plan.    Physical Exam Vitals and nursing note reviewed.  Constitutional:      Appearance: She is well-developed. She is obese.  HENT:     Head: Normocephalic and atraumatic.     Mouth/Throat:     Mouth: Mucous membranes are dry.  Eyes:     Conjunctiva/sclera: Conjunctivae normal.  Pulmonary:     Effort: Pulmonary effort is normal.  Musculoskeletal:        General: Normal range of motion.     Cervical back: Normal range of motion.  Skin:    General: Skin is warm.  Neurological:     General: No focal deficit present.     Mental Status: She is alert and oriented to  person, place, and time. Mental status is at baseline.  Psychiatric:        Mood and Affect: Mood normal.        Behavior: Behavior normal.        Thought Content: Thought content normal.        Judgment: Judgment normal.     Imaging: DG Chest 2 View Result Date: 01/03/2024 CLINICAL DATA:  PleurX catheter placement preop. EXAM: CHEST - 2 VIEW COMPARISON:  CT 11/07/2023.  X-ray 08/29/2023. FINDINGS: Moderate to large right pleural effusion identified with some adjacent opacities. No pneumothorax or edema. There is some linear opacity left midlung as well again likely scar atelectasis. No left-sided effusion. Normal cardiac silhouette when adjusted for technique and without obscuration of the right cardiac border. Degenerative changes along the spine. Surgical clips in the upper abdomen. IMPRESSION: Moderate to large right pleural effusion with adjacent opacities. Stable left midlung scar or atelectasis. Electronically Signed   By: Adrianna Horde M.D.   On: 01/03/2024 10:25    Labs:  CBC: Recent Labs    08/02/23 1646 08/03/23 0411 09/20/23 1417 11/10/23 1056  WBC 6.4 4.7 6.1 4.3  HGB 11.0* 9.4* 11.7* 11.6*  HCT 37.9 30.5* 37.0 37.7  PLT 187 170 196.0 174    COAGS: No results for input(s): "INR", "APTT" in the last 8760 hours.  BMP: Recent Labs    08/03/23 1312 08/03/23 1807 08/03/23 2313 08/04/23 0713 08/05/23 0444 08/18/23 1409 09/20/23 1417 11/10/23 1056 12/07/23 0811  NA 140  --  141  --  137 138 142 141 139  K 4.3   < > 4.7   < > 4.6  4.6 4.6 4.8 4.8 4.9  CL 111  --  111  --  109 105 110 111 109  CO2 21*  --  24  --  17* 25 23 24 23   GLUCOSE 102*  --  78  --  105* 183* 102* 123* 65  BUN 35*  --  38*  --  32* 34* 31* 32* 32*  CALCIUM  8.2*  --  8.4*  --  8.3* 8.9 9.3 9.3 9.1  CREATININE 1.39*  --  1.70*  --  1.77* 1.53* 1.51* 1.71* 1.49*  GFRNONAA 39*  --  31*  --  29*  --   --  30*  --    < > = values in this interval not displayed.    LIVER FUNCTION  TESTS: Recent Labs    08/03/23 2313 08/18/23 1409 09/20/23 1417 11/10/23 1056 12/07/23 0811  BILITOT 0.5 0.3 0.3 0.3 0.3  AST 165* 23 32 24 17  ALT 243* 25 27 19 15   ALKPHOS 122 119* 102 86  --   PROT 6.8 8.1 8.3 8.1 7.5  ALBUMIN 3.0* 3.9 3.9 3.8  --      Assessment and Plan:  78 y.o female outpatient. History of HTN, HLD, GERD, DM, non small cell lung cancer right s/p chemotherapy and radiation. Found to have a recurrent right sided thoracenteiss (August 2024, October 2024 and January 2025) Thought to be radiation related airway changes causing RLL atelectasis plus this recurrent effusion. Per note from Dr. Louie Rover dated 4.11.25 I do not think she would be a good candidate for tube pleurodesis because she does have a tiny pneumothorax ex vacuo and I suspect would not have good pleural adhesion. Team is requesting a right sided thoracic pleurX. Case reviewed and approved by IR Attending Dr. Fernando Hoyer.   PLAN: IR Image Guided Right Side PleurX Catheter Placement   Risks and benefits discussed with the patient including bleeding, infection, damage to adjacent structures, bowel perforation/fistula connection, and sepsis.  All of the patient's questions were answered, patient is agreeable to proceed. Consent signed and in chart.    Thank you for this interesting consult.  I greatly enjoyed meeting Jenna [name] and look forward to participating in their care.  A copy of this report was sent to the requesting provider on this date.  Electronically Signed: Marceil Sensor, NP 01/03/2024, 10:40 AM   I spent a total of  30 Minutes   in face to face in clinical consultation, greater than 50% of which was counseling/coordinating care for right sided thoracic pleurX catheter placement

## 2024-01-03 ENCOUNTER — Ambulatory Visit (HOSPITAL_COMMUNITY)
Admission: RE | Admit: 2024-01-03 | Discharge: 2024-01-03 | Disposition: A | Discharge status: Home / Self Care | Source: Ambulatory Visit | Attending: Radiology | Admitting: Radiology

## 2024-01-03 ENCOUNTER — Other Ambulatory Visit: Payer: Self-pay

## 2024-01-03 ENCOUNTER — Telehealth: Payer: Self-pay | Admitting: Internal Medicine

## 2024-01-03 ENCOUNTER — Ambulatory Visit (HOSPITAL_COMMUNITY)
Admission: RE | Admit: 2024-01-03 | Discharge: 2024-01-03 | Disposition: A | Discharge status: Home / Self Care | Source: Ambulatory Visit | Attending: Internal Medicine | Admitting: Internal Medicine

## 2024-01-03 VITALS — BP 139/51 | HR 74 | Temp 98.8°F | Resp 22 | Ht 65.0 in | Wt 198.0 lb

## 2024-01-03 DIAGNOSIS — Z87891 Personal history of nicotine dependence: Secondary | ICD-10-CM | POA: Insufficient documentation

## 2024-01-03 DIAGNOSIS — Z9221 Personal history of antineoplastic chemotherapy: Secondary | ICD-10-CM | POA: Insufficient documentation

## 2024-01-03 DIAGNOSIS — R918 Other nonspecific abnormal finding of lung field: Secondary | ICD-10-CM | POA: Diagnosis not present

## 2024-01-03 DIAGNOSIS — Z85118 Personal history of other malignant neoplasm of bronchus and lung: Secondary | ICD-10-CM | POA: Diagnosis not present

## 2024-01-03 DIAGNOSIS — C342 Malignant neoplasm of middle lobe, bronchus or lung: Secondary | ICD-10-CM

## 2024-01-03 DIAGNOSIS — Z794 Long term (current) use of insulin: Secondary | ICD-10-CM | POA: Insufficient documentation

## 2024-01-03 DIAGNOSIS — J90 Pleural effusion, not elsewhere classified: Secondary | ICD-10-CM | POA: Insufficient documentation

## 2024-01-03 DIAGNOSIS — E119 Type 2 diabetes mellitus without complications: Secondary | ICD-10-CM | POA: Diagnosis not present

## 2024-01-03 DIAGNOSIS — Z923 Personal history of irradiation: Secondary | ICD-10-CM | POA: Insufficient documentation

## 2024-01-03 DIAGNOSIS — K219 Gastro-esophageal reflux disease without esophagitis: Secondary | ICD-10-CM | POA: Insufficient documentation

## 2024-01-03 DIAGNOSIS — Z7984 Long term (current) use of oral hypoglycemic drugs: Secondary | ICD-10-CM | POA: Diagnosis not present

## 2024-01-03 DIAGNOSIS — I10 Essential (primary) hypertension: Secondary | ICD-10-CM | POA: Diagnosis not present

## 2024-01-03 DIAGNOSIS — E785 Hyperlipidemia, unspecified: Secondary | ICD-10-CM | POA: Insufficient documentation

## 2024-01-03 DIAGNOSIS — C3491 Malignant neoplasm of unspecified part of right bronchus or lung: Secondary | ICD-10-CM | POA: Diagnosis not present

## 2024-01-03 HISTORY — PX: IR PERC PLEURAL DRAIN W/INDWELL CATH W/IMG GUIDE: IMG5383

## 2024-01-03 LAB — GLUCOSE, CAPILLARY
Glucose-Capillary: 132 mg/dL — ABNORMAL HIGH (ref 70–99)
Glucose-Capillary: 61 mg/dL — ABNORMAL LOW (ref 70–99)

## 2024-01-03 MED ORDER — FENTANYL CITRATE (PF) 100 MCG/2ML IJ SOLN
INTRAMUSCULAR | Status: AC | PRN
  Administered 2024-01-03: 50 ug via INTRAVENOUS

## 2024-01-03 MED ORDER — SODIUM CHLORIDE 0.9 % IV SOLN: INTRAVENOUS | Status: DC

## 2024-01-03 MED ORDER — CEFAZOLIN SODIUM-DEXTROSE 2-4 GM/100ML-% IV SOLN
INTRAVENOUS | Status: AC | PRN
  Administered 2024-01-03: 2 g via INTRAVENOUS

## 2024-01-03 MED ORDER — DEXTROSE 50 % IV SOLN
INTRAVENOUS | Status: AC
  Filled 2024-01-03: qty 50

## 2024-01-03 MED ORDER — LIDOCAINE HCL 1 % IJ SOLN
INTRAMUSCULAR | Status: AC
  Filled 2024-01-03: qty 20

## 2024-01-03 MED ORDER — CEFAZOLIN SODIUM-DEXTROSE 2-4 GM/100ML-% IV SOLN
INTRAVENOUS | Status: AC
  Filled 2024-01-03: qty 100

## 2024-01-03 MED ORDER — MIDAZOLAM HCL 2 MG/2ML IJ SOLN
INTRAMUSCULAR | Status: AC
  Filled 2024-01-03: qty 2

## 2024-01-03 MED ORDER — LIDOCAINE HCL 1 % IJ SOLN
20.0000 mL | Freq: Once | INTRAMUSCULAR | Status: AC
  Administered 2024-01-03: 20 mL

## 2024-01-03 MED ORDER — DEXTROSE 50 % IV SOLN
12.5000 g | INTRAVENOUS | Status: AC
Start: 2024-01-03 — End: 2024-01-03
  Administered 2024-01-03: 12.5 g via INTRAVENOUS

## 2024-01-03 MED ORDER — FENTANYL CITRATE (PF) 100 MCG/2ML IJ SOLN
INTRAMUSCULAR | Status: AC
Start: 2024-01-03 — End: ?
  Filled 2024-01-03: qty 2

## 2024-01-03 MED ORDER — MIDAZOLAM HCL 2 MG/2ML IJ SOLN
INTRAMUSCULAR | Status: AC | PRN
  Administered 2024-01-03: 1 mg via INTRAVENOUS

## 2024-01-03 MED ORDER — CEFAZOLIN SODIUM-DEXTROSE 2-4 GM/100ML-% IV SOLN: 2.0000 g | INTRAVENOUS | Status: DC

## 2024-01-03 NOTE — Procedures (Signed)
 Interventional Radiology Procedure Note  Procedure: US  AND FLUORO RT CHEST PLEURX    Complications: None  Estimated Blood Loss:  MIN  Findings: 1.2 L THORA PERFORMED AFTER INSERTION     Jenna Even, MD

## 2024-01-03 NOTE — Telephone Encounter (Signed)
 Rc'd order from [facility] ([facility]) for Pleural Supplies. Will fwd to Dr. Dione Franks for signature.

## 2024-01-04 ENCOUNTER — Encounter (HOSPITAL_COMMUNITY): Payer: Self-pay

## 2024-01-04 ENCOUNTER — Other Ambulatory Visit (HOSPITAL_COMMUNITY): Payer: Self-pay | Admitting: Internal Medicine

## 2024-01-04 ENCOUNTER — Ambulatory Visit: Admitting: Internal Medicine

## 2024-01-04 DIAGNOSIS — J90 Pleural effusion, not elsewhere classified: Secondary | ICD-10-CM | POA: Diagnosis not present

## 2024-01-04 DIAGNOSIS — C3490 Malignant neoplasm of unspecified part of unspecified bronchus or lung: Secondary | ICD-10-CM | POA: Diagnosis not present

## 2024-01-04 DIAGNOSIS — C342 Malignant neoplasm of middle lobe, bronchus or lung: Secondary | ICD-10-CM

## 2024-01-04 NOTE — Telephone Encounter (Signed)
 [facility] faxed successfully and signed.

## 2024-01-17 ENCOUNTER — Ambulatory Visit (INDEPENDENT_AMBULATORY_CARE_PROVIDER_SITE_OTHER)

## 2024-01-17 VITALS — Ht 65.0 in | Wt 198.0 lb

## 2024-01-17 DIAGNOSIS — Z0000 Encounter for general adult medical examination without abnormal findings: Secondary | ICD-10-CM | POA: Diagnosis not present

## 2024-01-17 NOTE — Patient Instructions (Signed)
 Jenna [name] , Thank you for taking time out of your busy schedule to complete your Annual Wellness Visit with me. I enjoyed our conversation and look forward to speaking with you again next year. I, as well as your care team,  appreciate your ongoing commitment to your health goals. Please review the following plan we discussed and let me know if I can assist you in the future. Your Game plan/ To Do List    Follow up Visits: Next Medicare AWV with our clinical staff: 01/18/2025.   Have you seen your provider in the last 6 months (3 months if uncontrolled diabetes)? Yes Next Office Visit with your provider: 06/26/2024.  Clinician Recommendations:  Aim for 30 minutes of exercise or brisk walking, 6-8 glasses of water , and 5 servings of fruits and vegetables each day. You are due for a diabetic eye exam, a tetanus vaccine, a Hep C screening.      This is a list of the screening recommended for you and due dates:  Health Maintenance  Topic Date Due   Hepatitis C Screening  Never done   DTaP/Tdap/Td vaccine (1 - Tdap) Never done   Zoster (Shingles) Vaccine (1 of 2) Never done   COVID-19 Vaccine (3 - Pfizer risk series) 01/14/2020   Eye exam for diabetics  04/14/2022   Flu Shot  03/16/2024   Hemoglobin A1C  06/07/2024   Complete foot exam   09/11/2024   Yearly kidney function blood test for diabetes  12/06/2024   Yearly kidney health urinalysis for diabetes  12/06/2024   Medicare Annual Wellness Visit  01/16/2025   Colon Cancer Screening  12/08/2025   Pneumonia Vaccine  Completed   DEXA scan (bone density measurement)  Completed   HPV Vaccine  Aged Out   Meningitis B Vaccine  Aged Out    Advanced directives: (Declined) Advance directive discussed with you today. Even though you declined this today, please call our office should you change your mind, and we can give you the proper paperwork for you to fill out. Advance Care Planning is important because it:  [x]  Makes sure you receive the  medical care that is consistent with your values, goals, and preferences  [x]  It provides guidance to your family and loved ones and reduces their decisional burden about whether or not they are making the right decisions based on your wishes.  Follow the link provided in your after visit summary or read over the paperwork we have mailed to you to help you started getting your Advance Directives in place. If you need assistance in completing these, please reach out to us  so that we can help you!  See attachments for Preventive Care and Fall Prevention Tips.

## 2024-01-17 NOTE — Progress Notes (Signed)
 Subjective:   Jenna [name] is a 78 y.o. who presents for a Medicare Wellness preventive visit.  As a reminder, Annual Wellness Visits don't include a physical exam, and some assessments may be limited, especially if this visit is performed virtually. We may recommend an in-person follow-up visit with your provider if needed.  Visit Complete: Virtual I connected with  Jenna [name] on 01/17/24 by a audio enabled telemedicine application and verified that I am speaking with the correct person using two identifiers.  Patient Location: Home  Provider Location: Home Office  I discussed the limitations of evaluation and management by telemedicine. The patient expressed understanding and agreed to proceed.  Vital Signs: Because this visit was a virtual/telehealth visit, some criteria may be missing or patient reported. Any vitals not documented were not able to be obtained and vitals that have been documented are patient reported.  VideoDeclined- This patient declined Librarian, academic. Therefore the visit was completed with audio only.  Persons Participating in Visit: Patient.  AWV Questionnaire: No: Patient Medicare AWV questionnaire was not completed prior to this visit.  Cardiac Risk Factors include: advanced age (>68men, >56 women);hypertension;diabetes mellitus;dyslipidemia;Other (see comment), Risk factor comments: Aortic atherosclerosis     Objective:     Today's Vitals   01/17/24 1419  Weight: 198 lb (89.8 kg)  Height: 5\' 5"  (1.651 m)   Body mass index is 32.95 kg/m.     01/17/2024    2:25 PM 01/03/2024    9:40 AM 08/02/2023    7:38 PM 06/08/2023    1:28 PM 05/20/2023   11:45 PM 04/05/2023    6:50 AM 12/09/2022    9:35 AM  Advanced Directives  Does Patient Have a Medical Advance Directive? No No No No No No No  Would patient like information on creating a medical advance directive?   No - Patient declined   No - Patient  declined No - Patient declined    Current Medications (verified) Outpatient Encounter Medications as of 01/17/2024  Medication Sig   Accu-Chek Softclix Lancets lancets Use as instructed   albuterol  (VENTOLIN  HFA) 108 (90 Base) MCG/ACT inhaler Inhale 1-2 puffs into the lungs every 6 (six) hours as needed for wheezing or shortness of breath.   blood glucose meter kit and supplies KIT Use up to four times daily as directed/accu check guide kit   blood glucose meter kit and supplies Dispense based on patient and insurance preference. Use up to four times daily as directed. (FOR ICD-10 E10.9, E11.9).   Continuous Glucose Sensor (DEXCOM G7 SENSOR) MISC 1 Device by Does not apply route continuous.   diphenhydrAMINE  (BENADRYL ) 2 % cream Apply 1 application topically as needed for itching.   FARXIGA  10 MG TABS tablet Take 1 tablet (10 mg total) by mouth daily before breakfast. Follow-up appt due in Nov must see provider for future refills   Ferrous Sulfate  (IRON ) 325 (65 Fe) MG TABS Take 1 tablet (325 mg total) by mouth 2 (two) times daily with a meal.   fluticasone  furoate-vilanterol (BREO ELLIPTA ) 200-25 MCG/ACT AEPB Inhale 1 puff into the lungs daily.   furosemide  (LASIX ) 20 MG tablet Take 1 tablet (20 mg total) by mouth daily as needed.   Glucose Blood (BLOOD GLUCOSE TEST STRIPS 333) STRP 1 strip by In Vitro route 4 (four) times daily.   insulin  glargine (LANTUS  SOLOSTAR) 100 UNIT/ML Solostar Pen Inject 34 Units into the skin daily. And 31G, 5mm pen needles 1/day  Insulin  Pen Needle 32G X 4 MM MISC Inject 1 Needle into the skin every morning.   levothyroxine  (SYNTHROID ) 75 MCG tablet Take 1 tablet (75 mcg total) by mouth daily.   meclizine  (ANTIVERT ) 25 MG tablet Take 1 tablet (25 mg total) by mouth 3 (three) times daily as needed for dizziness.   metoprolol  succinate (TOPROL  XL) 25 MG 24 hr tablet Take 0.5 tablets (12.5 mg total) by mouth daily.   Multiple Vitamin (MULTIVITAMIN) capsule Take 1  capsule by mouth daily.   pravastatin  (PRAVACHOL ) 20 MG tablet Take 1 tablet (20 mg total) by mouth daily.   No facility-administered encounter medications on file as of 01/17/2024.    Allergies (verified) Paclitaxel , Aspirin, Esomeprazole magnesium, Losartan , and Ace inhibitors   History: Past Medical History:  Diagnosis Date   Blood transfusion without reported diagnosis    Cataract    DM (diabetes mellitus) (HCC)    Dyspnea    GERD (gastroesophageal reflux disease)    Hyperlipidemia    Hypertension    Iron  deficiency anemia    NSCL ca dx'd 03/2018   Pneumonia    Thyroid  disease    Past Surgical History:  Procedure Laterality Date   BIOPSY  12/09/2022   Procedure: BIOPSY;  Surgeon: Ace Holder, MD;  Location: [facility];  Service: Gastroenterology;;   BRONCHIAL BIOPSY  04/05/2023   Procedure: BRONCHIAL BIOPSIES;  Surgeon: Quillian Brunt, MD;  Location: [facility];  Service: Cardiopulmonary;;   BRONCHIAL BRUSHINGS  04/05/2023   Procedure: BRONCHIAL BRUSHINGS;  Surgeon: Quillian Brunt, MD;  Location: [facility];  Service: Cardiopulmonary;;   BRONCHIAL WASHINGS  04/05/2023   Procedure: BRONCHIAL WASHINGS;  Surgeon: Quillian Brunt, MD;  Location: [facility];  Service: Cardiopulmonary;;   CATARACT EXTRACTION Right    CHOLECYSTECTOMY     COLONOSCOPY  10/24/2009   normal rectum/1X1cm abnormal lesion in the ascending colon (bx benign). TI normal for 10cm.  Prep difficult/inadequate. f/u TCS 09/2012 recommended   COLONOSCOPY  10/13/2004   Normal rectum/Diminutive polyps, splenic flexure, cold biopsied/removed.  Remainder of colonic mucosa appeared normal.   COLONOSCOPY N/A 12/14/2012   ZOX:WRUEAVW polyp-tubular adenoma   COLONOSCOPY WITH PROPOFOL  N/A 12/09/2022   Procedure: COLONOSCOPY WITH PROPOFOL ;  Surgeon: Ace Holder, MD;  Location: [facility];  Service: Gastroenterology;  Laterality: N/A;   ESOPHAGOGASTRODUODENOSCOPY  10/13/2004    Normal  esophagus/ Nodular volcano like lesion in the antrum, either representing a  pancreatic rest or leiomyoma, biopsied.  Remainder of the gastric mucosa appeared normal, normal D1-D2   ESOPHAGOGASTRODUODENOSCOPY  10/24/2009   Benign biopsies. normal esophagus/small hiatal hernia/nodular lesion antrum/distal greater curvature. duodenal AVM s/p ablation   GIVENS CAPSULE STUDY  07/27/2010    multiple arteriovenous malformations which could definitely be the contributor to her drifting hemoglobin and hematocrit   HEMOSTASIS CONTROL  04/05/2023   Procedure: HEMOSTASIS CONTROL;  Surgeon: Quillian Brunt, MD;  Location: [facility];  Service: Cardiopulmonary;;   HOT HEMOSTASIS N/A 12/09/2022   Procedure: HOT HEMOSTASIS (ARGON PLASMA COAGULATION/BICAP);  Surgeon: Ace Holder, MD;  Location: [facility];  Service: Gastroenterology;  Laterality: N/A;   IR PERC PLEURAL DRAIN W/INDWELL CATH W/IMG GUIDE  01/03/2024   IR THORACENTESIS ASP PLEURAL SPACE W/IMG GUIDE  12/16/2020   POLYPECTOMY  12/09/2022   Procedure: POLYPECTOMY;  Surgeon: Ace Holder, MD;  Location: [facility];  Service: Gastroenterology;;   THORACENTESIS  04/05/2023   Procedure: THORACENTESIS;  Surgeon: Quillian Brunt, MD;  Location: [facility];  Service: Cardiopulmonary;;   THORACENTESIS Right 06/08/2023   Procedure: THORACENTESIS;  Surgeon: Josiah Nigh, MD;  Location[facility];  Service: Pulmonary;  Laterality: Right;   THORACENTESIS Right 08/29/2023   Procedure: THORACENTESIS;  Surgeon: Aleck Hurdle, MD;  Location: [facility];  Service: Pulmonary;  Laterality: Right;   TUBAL LIGATION     US  ECHOCARDIOGRAPHY     VIDEO BRONCHOSCOPY N/A 04/05/2023   Procedure: VIDEO BRONCHOSCOPY WITHOUT FLUORO;  Surgeon: Quillian Brunt, MD;  Location: [facility];  Service: Cardiopulmonary;  Laterality: N/A;   VIDEO BRONCHOSCOPY WITH ENDOBRONCHIAL ULTRASOUND N/A 04/27/2018   Procedure: VIDEO BRONCHOSCOPY WITH  ENDOBRONCHIAL ULTRASOUND;  Surgeon: Zelphia Higashi, MD;  Location: [facility];  Service: Thoracic;  Laterality: N/A;   Family History  Problem Relation Age of Onset   Diabetes Sister    Colon cancer Maternal Aunt        greater than age 62   Breast cancer Cousin    Diabetes Daughter    Diabetes Daughter    Amblyopia Neg Hx    Blindness Neg Hx    Cataracts Neg Hx    Glaucoma Neg Hx    Macular degeneration Neg Hx    Retinal detachment Neg Hx    Strabismus Neg Hx    Retinitis pigmentosa Neg Hx    Rectal cancer Neg Hx    Stomach cancer Neg Hx    Colon polyps Neg Hx    Esophageal cancer Neg Hx    Social History   Socioeconomic History   Marital status: Single    Spouse name: Not on file   Number of children: 3   Years of education: Not on file   Highest education level: Not on file  Occupational History   Occupation: retired  Tobacco Use   Smoking status: Former    Current packs/day: 0.00    Average packs/day: 0.5 packs/day for 55.0 years (27.5 ttl pk-yrs)    Types: Cigarettes    Start date: 03/19/1963    Quit date: 03/18/2018    Years since quitting: 5.8   Smokeless tobacco: Never   Tobacco comments:    smoked off and n  Vaping Use   Vaping status: Never Used  Substance and Sexual Activity   Alcohol  use: No   Drug use: No   Sexual activity: Not Currently  Other Topics Concern   Not on file  Social History Narrative   Patient fully vaccinated      Lives alone/2025   Social Drivers of Health   Financial Resource Strain: Low Risk  (01/17/2024)   Overall Financial Resource Strain (CARDIA)    Difficulty of Paying Living Expenses: Not hard at all  Food Insecurity: No Food Insecurity (01/17/2024)   Hunger Vital Sign    Worried About Running Out of Food in the Last Year: Never true    Ran Out of Food in the Last Year: Never true  Transportation Needs: No Transportation Needs (01/17/2024)   PRAPARE - Administrator, Civil Service (Medical): No    Lack of  Transportation (Non-Medical): No  Physical Activity: Inactive (01/17/2024)   Exercise Vital Sign    Days of Exercise per Week: 0 days    Minutes of Exercise per Session: 0 min  Stress: No Stress Concern Present (01/17/2024)   [employer] of Occupational Health - Occupational Stress Questionnaire    Feeling of Stress : Not at all  Social Connections: Socially Isolated (01/17/2024)   Social Connection and Isolation Panel [  NHANES]    Frequency of Communication with Friends and Family: More than three times a week    Frequency of Social Gatherings with Friends and Family: More than three times a week    Attends Religious Services: Never    Database administrator or Organizations: No    Attends Engineer, structural: Never    Marital Status: Never married    Tobacco Counseling Counseling given: Not Answered Tobacco comments: smoked off and n    Clinical Intake:  Pre-visit preparation completed: Yes  Pain : No/denies pain     BMI - recorded: 32.95 Nutritional Status: BMI > 30  Obese Nutritional Risks: None Diabetes: Yes CBG done?: No Did pt. bring in CBG monitor from home?: No  Lab Results  Component Value Date   HGBA1C 6.6 (H) 12/07/2023   HGBA1C 6.1 (A) 09/12/2023   HGBA1C 6.3 (A) 05/18/2023     How often do you need to have someone help you when you read instructions, pamphlets, or other written materials from your doctor or pharmacy?: 1 - Never  Interpreter Needed?: No  Information entered by :: Naureen Benton, RMA   Activities of Daily Living     01/17/2024    2:20 PM 08/02/2023    7:38 PM  In your present state of health, do you have any difficulty performing the following activities:  Hearing? 0 0  Vision? 0 0  Difficulty concentrating or making decisions? 0 0  Walking or climbing stairs? 0   Dressing or bathing? 0   Doing errands, shopping? 0 0  Comment Baby Bolt and family members   Quarry manager and eating ? N   Using the Toilet? N   In the  past six months, have you accidently leaked urine? N   Do you have problems with loss of bowel control? N   Managing your Medications? N   Managing your Finances? N   Housekeeping or managing your Housekeeping? N     Patient Care Team: Adelia Homestead, MD as PCP - General (Internal Medicine) Riley Cheadle Windsor Hatcher, MD (Gastroenterology)  I have updated your Care Teams any recent Medical Services you may have received from other providers in the past year.     Assessment:    This is a routine wellness examination for Jenna [name].  Hearing/Vision screen Hearing Screening - Comments:: Denies hearing difficulties   Vision Screening - Comments:: Denies vision issues.    Goals Addressed   None    Depression Screen     01/17/2024    2:30 PM 12/22/2022    9:48 AM 12/16/2021   10:22 AM 09/30/2020   10:40 AM 04/14/2020    5:00 PM 09/26/2019   10:34 AM 06/11/2019    2:17 PM  PHQ 2/9 Scores  PHQ - 2 Score 0 0 0 0 0 0 0  PHQ- 9 Score 0 0 3        Fall Risk     01/17/2024    2:26 PM 09/20/2023    1:34 PM 06/03/2023    2:30 PM 06/03/2023    2:27 PM 12/22/2022    9:48 AM  Fall Risk   Falls in the past year? 0 0 0 0 0  Number falls in past yr: 0 0 0 0 0  Injury with Fall? 0 0 0 0 0  Follow up Falls evaluation completed;Falls prevention discussed Falls evaluation completed Falls evaluation completed Falls evaluation completed Falls evaluation completed    MEDICARE RISK AT HOME:  Medicare Risk at Home Any stairs in or around the home?: Yes (outside) If so, are there any without handrails?: Yes Home free of loose throw rugs in walkways, pet beds, electrical cords, etc?: Yes Adequate lighting in your home to reduce risk of falls?: Yes Life alert?: No Use of a cane, walker or w/c?: No Grab bars in the bathroom?: No Shower chair or bench in shower?: Yes Elevated toilet seat or a handicapped toilet?: Yes  TIMED UP AND GO:  Was the test performed?  No  Cognitive Function:  Declined/Normal: No cognitive concerns noted by patient or family. Patient alert, oriented, able to answer questions appropriately and recall recent events. No signs of memory loss or confusion.        Immunizations Immunization History  Administered Date(s) Administered   Fluad Quad(high Dose 65+) 05/31/2019, 09/12/2020   Influenza-Unspecified 04/16/2014   PFIZER(Purple Top)SARS-COV-2 Vaccination 11/22/2019, 12/17/2019   Pneumococcal Conjugate-13 06/10/2014   Pneumococcal Polysaccharide-23 04/19/2013    Screening Tests Health Maintenance  Topic Date Due   Hepatitis C Screening  Never done   DTaP/Tdap/Td (1 - Tdap) Never done   Zoster Vaccines- Shingrix (1 of 2) Never done   COVID-19 Vaccine (3 - Pfizer risk series) 01/14/2020   OPHTHALMOLOGY EXAM  04/14/2022   INFLUENZA VACCINE  03/16/2024   HEMOGLOBIN A1C  06/07/2024   FOOT EXAM  09/11/2024   Diabetic kidney evaluation - eGFR measurement  12/06/2024   Diabetic kidney evaluation - Urine ACR  12/06/2024   Medicare Annual Wellness (AWV)  01/16/2025   Colonoscopy  12/08/2025   Pneumonia Vaccine 60+ Years old  Completed   DEXA SCAN  Completed   HPV VACCINES  Aged Out   Meningococcal B Vaccine  Aged Out    Health Maintenance  Health Maintenance Due  Topic Date Due   Hepatitis C Screening  Never done   DTaP/Tdap/Td (1 - Tdap) Never done   Zoster Vaccines- Shingrix (1 of 2) Never done   COVID-19 Vaccine (3 - Pfizer risk series) 01/14/2020   OPHTHALMOLOGY EXAM  04/14/2022   Health Maintenance Items Addressed: See Nurse Notes at the end of this note  Additional Screening:  Vision Screening: Recommended annual ophthalmology exams for early detection of glaucoma and other disorders of the eye. Would you like a referral to an eye doctor? Yes    Dental Screening: Recommended annual dental exams for proper oral hygiene  Community Resource Referral / Chronic Care Management: CRR required this visit?  No   CCM required  this visit?  No   Plan:    I have personally reviewed and noted the following in the patient's chart:   Medical and social history Use of alcohol , tobacco or illicit drugs  Current medications and supplements including opioid prescriptions. Patient is not currently taking opioid prescriptions. Functional ability and status Nutritional status Physical activity Advanced directives List of other physicians Hospitalizations, surgeries, and ER visits in previous 12 months Vitals Screenings to include cognitive, depression, and falls Referrals and appointments  In addition, I have reviewed and discussed with patient certain preventive protocols, quality metrics, and best practice recommendations. A written personalized care plan for preventive services as well as general preventive health recommendations were provided to patient.   Ziah Leandro L Jemuel Laursen, CMA   01/17/2024   After Visit Summary: (MyChart) Due to this being a telephonic visit, the after visit summary with patients personalized plan was offered to patient via MyChart   Notes: Please refer to Routing Comments.

## 2024-01-18 ENCOUNTER — Encounter: Payer: Self-pay | Admitting: Internal Medicine

## 2024-01-18 ENCOUNTER — Ambulatory Visit: Admitting: Internal Medicine

## 2024-01-18 VITALS — BP 120/72 | HR 95 | Ht 65.0 in | Wt 198.0 lb

## 2024-01-18 DIAGNOSIS — J90 Pleural effusion, not elsewhere classified: Secondary | ICD-10-CM | POA: Diagnosis not present

## 2024-01-18 NOTE — Progress Notes (Signed)
 Jenna [name]    [id number]    01-23-1946  Primary Care Physician:Crawford, Marjory Signs, MD Date of Appointment: 01/18/2024 Established Patient Visit  Chief complaint:   Chief Complaint  Patient presents with   Follow-up     HPI: Jenna [name] is a 78 y.o. woman with h/o tobacco use disorder and mild emphysema. 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   Interval Updates: Here for follow up after pleurx catheter placement.  No one in the hospital taught her how to drain the pleurx.  Home health was not arranged.  She does not remember if the fluid drainage three weeks ago hurt.  Her son has come with her today.    I have reviewed the patient's family social and past medical history and updated as appropriate.   Past Medical History:  Diagnosis Date   Blood transfusion without reported diagnosis    Cataract    DM (diabetes mellitus) (HCC)    Dyspnea    GERD (gastroesophageal reflux disease)    Hyperlipidemia    Hypertension    Iron  deficiency anemia    NSCL ca dx'd 03/2018   Pneumonia    Thyroid  disease     Past Surgical History:  Procedure Laterality Date   BIOPSY  12/09/2022   Procedure: BIOPSY;  Surgeon: Ace Holder, MD;  Location: [facility];  Service: Gastroenterology;;   BRONCHIAL BIOPSY  04/05/2023   Procedure: BRONCHIAL BIOPSIES;  Surgeon: Quillian Brunt, MD;  Location: [facility];  Service: Cardiopulmonary;;   BRONCHIAL BRUSHINGS  04/05/2023   Procedure: BRONCHIAL BRUSHINGS;  Surgeon: Quillian Brunt, MD;  Location: [facility];  Service: Cardiopulmonary;;   BRONCHIAL WASHINGS  04/05/2023   Procedure: BRONCHIAL WASHINGS;  Surgeon: Quillian Brunt, MD;  Location: [facility];  Service: Cardiopulmonary;;   CATARACT EXTRACTION Right    CHOLECYSTECTOMY     COLONOSCOPY  10/24/2009   normal  rectum/1X1cm abnormal lesion in the ascending colon (bx benign). TI normal for 10cm.  Prep difficult/inadequate. f/u TCS 09/2012 recommended   COLONOSCOPY  10/13/2004   Normal rectum/Diminutive polyps, splenic flexure, cold biopsied/removed.  Remainder of colonic mucosa appeared normal.   COLONOSCOPY N/A 12/14/2012   NWG:NFAOZHY polyp-tubular adenoma   COLONOSCOPY WITH PROPOFOL  N/A 12/09/2022   Procedure: COLONOSCOPY WITH PROPOFOL ;  Surgeon: Ace Holder, MD;  Location: [facility];  Service: Gastroenterology;  Laterality: N/A;   ESOPHAGOGASTRODUODENOSCOPY  10/13/2004    Normal esophagus/ Nodular volcano like lesion in the antrum, either representing a  pancreatic rest or leiomyoma, biopsied.  Remainder of the gastric mucosa appeared normal, normal D1-D2   ESOPHAGOGASTRODUODENOSCOPY  10/24/2009   Benign biopsies. normal esophagus/small hiatal hernia/nodular lesion antrum/distal greater curvature. duodenal AVM s/p ablation   GIVENS CAPSULE STUDY  07/27/2010    multiple arteriovenous malformations which could definitely be the contributor to her drifting hemoglobin and hematocrit   HEMOSTASIS CONTROL  04/05/2023   Procedure: HEMOSTASIS CONTROL;  Surgeon: Quillian Brunt, MD;  Location: [facility];  Service: Cardiopulmonary;;   HOT HEMOSTASIS N/A 12/09/2022   Procedure: HOT HEMOSTASIS (ARGON PLASMA COAGULATION/BICAP);  Surgeon: Ace Holder, MD;  Location: [facility];  Service: Gastroenterology;  Laterality: N/A;   IR PERC PLEURAL DRAIN W/INDWELL CATH W/IMG GUIDE  01/03/2024   IR THORACENTESIS ASP PLEURAL SPACE W/IMG GUIDE  12/16/2020   POLYPECTOMY  12/09/2022   Procedure:  POLYPECTOMY;  Surgeon: Ace Holder, MD;  Location: [facility];  Service: Gastroenterology;;   THORACENTESIS  04/05/2023   Procedure: Antoine Kirsch;  Surgeon: Quillian Brunt, MD;  Location: [facility];  Service: Cardiopulmonary;;   THORACENTESIS Right 06/08/2023   Procedure: Antoine Kirsch;   Surgeon: Josiah Nigh, MD;  Location: [facility];  Service: Pulmonary;  Laterality: Right;   THORACENTESIS Right 08/29/2023   Procedure: THORACENTESIS;  Surgeon: Aleck Hurdle, MD;  Location: [facility]COPY;  Service: Pulmonary;  Laterality: Right;   TUBAL LIGATION     US  ECHOCARDIOGRAPHY     VIDEO BRONCHOSCOPY N/A 04/05/2023   Procedure: VIDEO BRONCHOSCOPY WITHOUT FLUORO;  Surgeon: Quillian Brunt, MD;  Location: [facility];  Service: Cardiopulmonary;  Laterality: N/A;   VIDEO BRONCHOSCOPY WITH ENDOBRONCHIAL ULTRASOUND N/A 04/27/2018   Procedure: VIDEO BRONCHOSCOPY WITH ENDOBRONCHIAL ULTRASOUND;  Surgeon: Zelphia Higashi, MD;  Location: [facility];  Service: Thoracic;  Laterality: N/A;    Family History  Problem Relation Age of Onset   Diabetes Sister    Colon cancer Maternal Aunt        greater than age 39   Breast cancer Cousin    Diabetes Daughter    Diabetes Daughter    Amblyopia Neg Hx    Blindness Neg Hx    Cataracts Neg Hx    Glaucoma Neg Hx    Macular degeneration Neg Hx    Retinal detachment Neg Hx    Strabismus Neg Hx    Retinitis pigmentosa Neg Hx    Rectal cancer Neg Hx    Stomach cancer Neg Hx    Colon polyps Neg Hx    Esophageal cancer Neg Hx     Social History   Occupational History   Occupation: retired  Tobacco Use   Smoking status: Former    Current packs/day: 0.00    Average packs/day: 0.5 packs/day for 55.0 years (27.5 ttl pk-yrs)    Types: Cigarettes    Start date: 03/19/1963    Quit date: 03/18/2018    Years since quitting: 5.8   Smokeless tobacco: Never   Tobacco comments:    smoked off and n  Vaping Use   Vaping status: Never Used  Substance and Sexual Activity   Alcohol  use: No   Drug use: No   Sexual activity: Not Currently     Physical Exam: Blood pressure 120/72, pulse 95, height 5\' 5"  (1.651 m), weight 198 lb (89.8 kg), SpO2 96%.  Gen:      No distress Lungs:  breath sounds diminished right lung base. Pleurx in place,  incision cdi.  CV:         RRR   Data Reviewed: Imaging: CT Chest March 2025 shows recurrent right sided pleural effusion  PFTs:     Latest Ref Rng & Units 03/15/2022   10:43 AM 05/11/2018    2:33 PM  PFT Results  FVC-Pre L 1.23    FVC-Predicted Pre % 41  71   FVC-Post L 1.13  1.77   FVC-Predicted Post % 38  73   Pre FEV1/FVC % % 80  83   Post FEV1/FCV % % 80  82   FEV1-Pre L 0.98  1.44   FEV1-Predicted Pre % 44  76   FEV1-Post L 0.91  1.46   DLCO uncorrected ml/min/mmHg 7.37  10.35   DLCO UNC% % 37  40   DLCO corrected ml/min/mmHg 7.37  12.65   DLCO COR %Predicted % 37  49   DLVA  Predicted % 64  79   TLC L 3.62  4.30   TLC % Predicted % 69  82   RV % Predicted % 88  104    I have personally reviewed the patient's PFTs and show severe restriction to ventilation with reduced diffusion capacity.   Labs: Lab Results  Component Value Date   NA 139 12/07/2023   K 4.9 12/07/2023   CO2 23 12/07/2023   GLUCOSE 65 12/07/2023   BUN 32 (H) 12/07/2023   CREATININE 1.49 (H) 12/07/2023   CALCIUM  9.1 12/07/2023   GFR 33.19 (L) 09/20/2023   EGFR 36 (L) 12/07/2023   GFRNONAA 30 (L) 11/10/2023   Lab Results  Component Value Date   WBC 4.3 11/10/2023   HGB 11.6 (L) 11/10/2023   HCT 37.7 11/10/2023   MCV 87.7 11/10/2023   PLT 174 11/10/2023    Immunization status: Immunization History  Administered Date(s) Administered   Fluad Quad(high Dose 65+) 05/31/2019, 09/12/2020   Influenza-Unspecified 04/16/2014   PFIZER(Purple Top)SARS-COV-2 Vaccination 11/22/2019, 12/17/2019   Pneumococcal Conjugate-13 06/10/2014   Pneumococcal Polysaccharide-23 04/19/2013    External Records Personally Reviewed: oncology, primary care.   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 Now worsened in the setting of right lung collapse Recurrent Right sided pleural  effusion Emphysema, mild History of tobacco use, quit 2019 Chronic cough, likely reflux  Plan/Recommendations: Likely multifactorial dyspnea from chronic RLL collapse secondary to post - radiation changes, recurrent pleural effusion.   Home health not approved. She did not have any teaching from radiology for her pleurx. She is here for stitch removal and drainage education. We drained her pleurx today and taught her and her son how to do it. Stitches removed.    I recommend draining twice weekly for as long as the drainage is about 500.  You can increase to 3 times a weekly if it continues to be over 500.  The most I would drain is every other day.  For now I would recommend repeating again this weekend.  Once the drainage drops to 200 you can drop down to once a week.  If drainage weekly only brings out 100 cc a week you can call me sooner as it may be time to take the Pleurx out.   Of course if you have any worsening breathing you can always try emptying additionally.  Call me sooner if any issues with the Pleurx or concerns about your breathing.    I spent 45 minutes in the care of this patient today including pre-charting, chart review, review of results, face-to-face care, coordination of care and communication with consultants etc.).   Return to Care: Return in about 3 months (around 04/19/2024).   Louie Rover, MD Pulmonary and Critical Care Medicine [facility] Office:[phone]

## 2024-01-18 NOTE — Progress Notes (Signed)
 Drain 1500 cc golden drainage from right pleurx catheter.  Patient tolerated procedure well.  Patient's son was taught as I was draining and video tapped for future reference in case anyone else in his family needed to drain it in his absence.

## 2024-01-18 NOTE — Patient Instructions (Addendum)
 It was a pleasure to see you today!  Please schedule follow up with myself in 3 months.  If my schedule is not open yet, we will contact you with a reminder closer to that time. Please call [phone] if you haven't heard from us  a month before, and always call us  sooner if issues or concerns arise. You can also send us  a message through MyChart, but but aware that this is not to be used for urgent issues and it may take up to 5-7 days to receive a reply. Please be aware that you will likely be able to view your results before I have a chance to respond to them. Please give us  5 business days to respond to any non-urgent results.     I am glad your breathing is doing well and we are able to get the Pleurx placed.  I recommend draining twice weekly for as long as the drainage is about 500.  You can increase to 3 times a weekly if it continues to be over 500.  The most I would drain is every other day.  For now I would recommend repeating again this weekend.  Once the drainage drops to 200 you can drop down to once a week.  If drainage weekly only brings out 100 cc a week you can call me sooner as it may be time to take the Pleurx out.   Of course if you have any worsening breathing you can always try emptying additionally.  Call me sooner if any issues with the Pleurx or concerns about your breathing.

## 2024-02-09 ENCOUNTER — Encounter: Payer: Self-pay | Admitting: Family Medicine

## 2024-02-09 ENCOUNTER — Ambulatory Visit: Admitting: Family Medicine

## 2024-02-09 VITALS — BP 140/68 | HR 95 | Temp 98.5°F | Ht 65.0 in | Wt 189.0 lb

## 2024-02-09 DIAGNOSIS — R5383 Other fatigue: Secondary | ICD-10-CM

## 2024-02-09 DIAGNOSIS — J90 Pleural effusion, not elsewhere classified: Secondary | ICD-10-CM

## 2024-02-09 DIAGNOSIS — E118 Type 2 diabetes mellitus with unspecified complications: Secondary | ICD-10-CM | POA: Diagnosis not present

## 2024-02-09 DIAGNOSIS — D509 Iron deficiency anemia, unspecified: Secondary | ICD-10-CM | POA: Diagnosis not present

## 2024-02-09 DIAGNOSIS — R052 Subacute cough: Secondary | ICD-10-CM | POA: Diagnosis not present

## 2024-02-09 DIAGNOSIS — G4489 Other headache syndrome: Secondary | ICD-10-CM | POA: Diagnosis not present

## 2024-02-09 DIAGNOSIS — I10 Essential (primary) hypertension: Secondary | ICD-10-CM

## 2024-02-09 LAB — COMPREHENSIVE METABOLIC PANEL WITH GFR
ALT: 28 U/L (ref 0–35)
AST: 26 U/L (ref 0–37)
Albumin: 3.5 g/dL (ref 3.5–5.2)
Alkaline Phosphatase: 150 U/L — ABNORMAL HIGH (ref 39–117)
BUN: 28 mg/dL — ABNORMAL HIGH (ref 6–23)
CO2: 26 meq/L (ref 19–32)
Calcium: 9.2 mg/dL (ref 8.4–10.5)
Chloride: 105 meq/L (ref 96–112)
Creatinine, Ser: 1.53 mg/dL — ABNORMAL HIGH (ref 0.40–1.20)
GFR: 32.58 mL/min — ABNORMAL LOW (ref >60.00)
Glucose, Bld: 134 mg/dL — ABNORMAL HIGH (ref 70–99)
Potassium: 4.5 meq/L (ref 3.5–5.1)
Sodium: 138 meq/L (ref 135–145)
Total Bilirubin: 0.3 mg/dL (ref 0.2–1.2)
Total Protein: 8.3 g/dL (ref 6.0–8.3)

## 2024-02-09 LAB — CBC WITH DIFFERENTIAL/PLATELET
Basophils Absolute: 0 10*3/uL (ref 0.0–0.1)
Basophils Relative: 0.5 % (ref 0.0–3.0)
Eosinophils Absolute: 0.1 10*3/uL (ref 0.0–0.7)
Eosinophils Relative: 1.5 % (ref 0.0–5.0)
HCT: 35 % — ABNORMAL LOW (ref 36.0–46.0)
Hemoglobin: 11.1 g/dL — ABNORMAL LOW (ref 12.0–15.0)
Lymphocytes Relative: 9.6 % — ABNORMAL LOW (ref 12.0–46.0)
Lymphs Abs: 0.6 10*3/uL — ABNORMAL LOW (ref 0.7–4.0)
MCHC: 31.8 g/dL (ref 30.0–36.0)
MCV: 83.5 fl (ref 78.0–100.0)
Monocytes Absolute: 0.3 10*3/uL (ref 0.1–1.0)
Monocytes Relative: 5.5 % (ref 3.0–12.0)
Neutro Abs: 5 10*3/uL (ref 1.4–7.7)
Neutrophils Relative %: 82.9 % — ABNORMAL HIGH (ref 43.0–77.0)
Platelets: 266 10*3/uL (ref 150.0–400.0)
RBC: 4.19 Mil/uL (ref 3.87–5.11)
RDW: 15.8 % — ABNORMAL HIGH (ref 11.5–15.5)
WBC: 6 10*3/uL (ref 4.0–10.5)

## 2024-02-09 LAB — POC COVID19 BINAXNOW: SARS Coronavirus 2 Ag: NEGATIVE

## 2024-02-09 NOTE — Patient Instructions (Signed)
 We are checking labs today, will be in contact with any results that require further attention  We are getting an xray today. We will be in contact with any abnormal results that require further attention.  Recommend that you drain your Pleurx catheter today  We will be in contact with x-ray results and create a treatment plan from there.

## 2024-02-09 NOTE — Assessment & Plan Note (Signed)
 Drain Pleurx catheter today This may help improve your fatigue and shortness of breath Follow-up with pulmonology as scheduled CXR today

## 2024-02-09 NOTE — Assessment & Plan Note (Signed)
 Discussed that illness can increase blood sugars, recommended that she check these at least once daily Continue current medication regimen CMP today

## 2024-02-09 NOTE — Progress Notes (Signed)
 Acute Office Visit  Subjective:     Patient ID: Jenna [name], female    DOB: 03-18-1946, 78 y.o.   MRN: [id number]  Chief Complaint  Patient presents with   Headache    Headache, patient states it has not improved   Cough    HPI Patient is in today for evaluation of cough, SOB, fatigue, sweats, headaches for the last week. Has tried tylenol  with little relief.  Has pleurx cath in place to RLL. Last drained 5 days ago with fluid removed.  Cannot recall if cough improves with cath drainage or not. Denies known sick contacts.  Denies abdominal pain, nausea, vomiting, diarrhea, rash, fever, other symptoms. Has not been checking blood pressures or blood sugars at home. Patient is concerned for pneumonia. Reports that she just took her blood pressure medication right before she came into the office.   ROS Per HPI      Objective:    BP (!) 140/68 (BP Location: Left Arm, Patient Position: Sitting, Cuff Size: Large)   Pulse 95   Temp 98.5 F (36.9 C)   Ht 5' 5 (1.651 m)   Wt 189 lb (85.7 kg)   SpO2 98%   BMI 31.45 kg/m    Physical Exam Vitals and nursing note reviewed.  Constitutional:      General: She is not in acute distress.    Appearance: Normal appearance. She is ill-appearing.  HENT:     Head: Normocephalic and atraumatic.     Right Ear: External ear normal.     Left Ear: External ear normal.     Nose: Nose normal.     Mouth/Throat:     Mouth: Mucous membranes are moist.     Pharynx: Oropharynx is clear.   Eyes:     Extraocular Movements: Extraocular movements intact.     Pupils: Pupils are equal, round, and reactive to light.    Cardiovascular:     Rate and Rhythm: Normal rate and regular rhythm.     Pulses: Normal pulses.     Heart sounds: Normal heart sounds.  Pulmonary:     Effort: No respiratory distress.     Breath sounds: Wheezing (bilat upper lobes), rhonchi (right lung) and rales (RUL-RML) present.     Comments: Mildly  labored breathing with activity and longer sentences  Musculoskeletal:     Cervical back: Normal range of motion.     Right lower leg: No edema.     Left lower leg: No edema.  Lymphadenopathy:     Cervical: No cervical adenopathy.   Neurological:     General: No focal deficit present.     Mental Status: She is alert and oriented to person, place, and time.   Psychiatric:        Mood and Affect: Mood normal.        Thought Content: Thought content normal.     Results for orders placed or performed in visit on 02/09/24  POC COVID-19  Result Value Ref Range   SARS Coronavirus 2 Ag Negative Negative        Assessment & Plan:   Pleural effusion on right Assessment & Plan: CBC, CMP today Chest x-ray today  Orders: -     CBC with Differential/Platelet -     Comprehensive metabolic panel with GFR -     DG Chest 2 View; Future  Diabetes mellitus type 2 with complications [facility]) Assessment & Plan: Discussed that illness can increase blood sugars, recommended that  she check these at least once daily Continue current medication regimen CMP today  Orders: -     POC COVID-19 BinaxNow  Iron  deficiency anemia, unspecified iron  deficiency anemia type Assessment & Plan: CBC today given fatigue and recent illness    Other headache syndrome Assessment & Plan: May continue Tylenol  with benefit Discussed antihypertensive medication compliance, maintain adequate hydration COVID test was negative today  Orders: -     POC COVID-19 BinaxNow -     CBC with Differential/Platelet -     Comprehensive metabolic panel with GFR -     DG Chest 2 View; Future  Other fatigue -     POC COVID-19 BinaxNow -     CBC with Differential/Platelet -     Comprehensive metabolic panel with GFR -     DG Chest 2 View; Future  Primary hypertension Assessment & Plan: Discussed medication compliance, lifestyle factors Also discussed that it could be because she is sick that her blood pressure  is elevated, discussed this could also cause headaches   Subacute cough Assessment & Plan: Drain Pleurx catheter today This may help improve your fatigue and shortness of breath Follow-up with pulmonology as scheduled CXR today  Orders: -     POC COVID-19 BinaxNow     No orders of the defined types were placed in this encounter.   Return if symptoms worsen or fail to improve, for With specialist and PCP as scheduled.  Corean LITTIE Ku, FNP

## 2024-02-09 NOTE — Assessment & Plan Note (Signed)
 CBC today given fatigue and recent illness

## 2024-02-09 NOTE — Assessment & Plan Note (Signed)
 Discussed medication compliance, lifestyle factors Also discussed that it could be because she is sick that her blood pressure is elevated, discussed this could also cause headaches

## 2024-02-09 NOTE — Assessment & Plan Note (Signed)
 CBC, CMP today Chest x-ray today

## 2024-02-09 NOTE — Assessment & Plan Note (Signed)
 May continue Tylenol  with benefit Discussed antihypertensive medication compliance, maintain adequate hydration COVID test was negative today

## 2024-02-10 ENCOUNTER — Ambulatory Visit (INDEPENDENT_AMBULATORY_CARE_PROVIDER_SITE_OTHER)

## 2024-02-10 ENCOUNTER — Ambulatory Visit: Payer: Self-pay | Admitting: Family Medicine

## 2024-02-10 DIAGNOSIS — J939 Pneumothorax, unspecified: Secondary | ICD-10-CM | POA: Diagnosis not present

## 2024-02-10 DIAGNOSIS — R5383 Other fatigue: Secondary | ICD-10-CM

## 2024-02-10 DIAGNOSIS — J948 Other specified pleural conditions: Secondary | ICD-10-CM | POA: Diagnosis not present

## 2024-02-10 DIAGNOSIS — R0602 Shortness of breath: Secondary | ICD-10-CM | POA: Diagnosis not present

## 2024-02-10 DIAGNOSIS — J90 Pleural effusion, not elsewhere classified: Secondary | ICD-10-CM

## 2024-02-10 DIAGNOSIS — R059 Cough, unspecified: Secondary | ICD-10-CM | POA: Diagnosis not present

## 2024-02-10 DIAGNOSIS — G4489 Other headache syndrome: Secondary | ICD-10-CM

## 2024-02-14 ENCOUNTER — Telehealth: Payer: Self-pay | Admitting: Internal Medicine

## 2024-02-14 NOTE — Telephone Encounter (Signed)
 Copied from CRM [id number]. Topic: Clinical - Lab/Test Results >> Feb 14, 2024 12:38 PM Macario HERO wrote: Reason for CRM: Patient is requesting follow up regarding lab results, x-ray. She states that she is still not feeling well and thought Corean Ku would send a prescription to her pharmacy after her last visit and few lab test.

## 2024-02-15 NOTE — Telephone Encounter (Signed)
 Patient was already advised in regard to this when I spoke with the patient. No further action needed at this time

## 2024-03-13 DIAGNOSIS — N184 Chronic kidney disease, stage 4 (severe): Secondary | ICD-10-CM | POA: Diagnosis not present

## 2024-03-15 ENCOUNTER — Ambulatory Visit: Payer: Self-pay | Admitting: Internal Medicine

## 2024-03-15 DIAGNOSIS — J90 Pleural effusion, not elsewhere classified: Secondary | ICD-10-CM

## 2024-03-15 NOTE — Telephone Encounter (Signed)
 I called the patient and she had not drained in several days and when she drained Sunday she had some clot and serosanguinous drainage about 400cc. Drained again Wednesday and same thing. Color of koolaid. No pain. No fevers chills, no worsening dyspnea. She feels great and at her baseline. Recommend coming into the the office for a chest xray tomorrow. She can continue draining on her regular schedule. I asked her to notify me if there is pain, clots, or failure of pleurx draining.

## 2024-03-15 NOTE — Telephone Encounter (Signed)
 FYI Only or Action Required?: Action required by provider: needs call back asap for next steps with chest tube draining blood - they took pictures they would like to send in via email as well.  Patient is followed in Pulmonology for lung cancer and pleural effusion, last seen on 01/18/2024 by Meade Verdon RAMAN, MD.  Called Nurse Triage reporting chest tube draining blood.  Symptoms began several days ago.  Interventions attempted: Nothing.  Symptoms are: gradually worsening.  Triage Disposition: Call PCP Now  Patient/caregiver understands and will follow disposition?: Yes      Copied from CRM [id number]. Topic: Clinical - Red Word Triage >> Mar 15, 2024  9:17 AM Jenna [name] wrote: Red Word that prompted transfer to Nurse Triage: Patient states while her grand daughter was draining her tube located in her right lung - it had blood in it (yesterday evening).  Reason for Disposition  Nursing judgment or information in reference  Answer Assessment - Initial Assessment Questions 1. REASON FOR CALL: What is your main concern right now?     Chest tube in right lung had blood in it 2. ONSET: When did the blood from chest tube start?     On Sunday too had blood in it Yesterday blood in it 3. SEVERITY: How bad is the blood from chest tube?     Looked like might have been [name] clog and came through the tube, because when emptied it into the commode, could see it was like [name] phlegmy piece of blood in the tube In the fluid from my lungs reddish looking, watery blood 4. FUNCTIONAL IMPAIRMENT: How have things been going for you overall? Have you had more difficulty than usual doing your normal daily activities? (e.g., self-care, school, work, interactions)     No SOB more than usual and no chest pain no problem at all with it No dizziness, weakness 7. OTHER SYMPTOMS: Do you have any other new symptoms?     Denies all other symptoms   Took pics of all of it Advised could send pics through  MyChart, pt not familiar with MyChart, asking for an email to send them to    Advised that sending asap note to pulm clinical staff for pt to receive call back with further recommendations, including on how to best get pictures to office if no MyChart. Advised call back if any new or worsening symptoms.  Protocols used: No Guideline Available-[name]

## 2024-03-15 NOTE — Telephone Encounter (Signed)
 Called patient ,patient got chest tube placed May 20th,drained it on Sunday evening and yesterday evening,on Sunday evening drained about 800cc,and yesterday evening not as much,normally is a yellow color,but on Sunday and yesterday it was dark red mixed in with the fluid.Please advise

## 2024-03-19 ENCOUNTER — Ambulatory Visit

## 2024-03-19 DIAGNOSIS — J939 Pneumothorax, unspecified: Secondary | ICD-10-CM | POA: Diagnosis not present

## 2024-03-19 DIAGNOSIS — R918 Other nonspecific abnormal finding of lung field: Secondary | ICD-10-CM | POA: Diagnosis not present

## 2024-03-19 DIAGNOSIS — J90 Pleural effusion, not elsewhere classified: Secondary | ICD-10-CM | POA: Diagnosis not present

## 2024-03-20 ENCOUNTER — Ambulatory Visit: Admitting: "Endocrinology

## 2024-03-21 ENCOUNTER — Encounter: Payer: Self-pay | Admitting: Internal Medicine

## 2024-03-22 NOTE — Telephone Encounter (Signed)
 FYI

## 2024-03-22 NOTE — Telephone Encounter (Signed)
**Note De-identified  [place] Obfuscation** Please advise 

## 2024-03-29 ENCOUNTER — Telehealth: Payer: Self-pay

## 2024-03-29 NOTE — Telephone Encounter (Signed)
 I called and spoke with the patient, she stated she just needed more supplies to drain her pleurex catheter.  She states that our office ordered the first box of supplies.  I advised her that they have the order that they need and we did fax the first order to [facility], however, now she can call [phone] to reorder supplies.  She started out draining the pleurx 3 times per week and now she is draining 2 times per week.  She verbalized understanding.  I let her know to call us  back if she needs anything further.  Nothing further needed.

## 2024-03-29 NOTE — Telephone Encounter (Signed)
 Copied from CRM #[id number]. Topic: Clinical - Prescription Issue >> Mar 26, 2024  8:39 AM Celestine FALCON wrote: Reason for CRM: Pt is requesting Dr. Meade to fill a prescription for Pleurx drainage kit with 1000 ml bottles, but I looked at the pt's chart and I couldn't locate that medication. I also checked to see if it was discontinued medication, but I couldn't find it there either.  Pt stated she has fluid in her lungs that this helps draw it out. Pt stated it gets shipped to her through [employer]. Pt stated she got these after having surgery.   Pt is requesting a phone call.The pt's phone number is [phone] ok to leave a vm.

## 2024-03-29 NOTE — Telephone Encounter (Signed)
 Called and spoke with Ross at [facility]

## 2024-04-02 NOTE — Telephone Encounter (Signed)
 Handled via [employer]

## 2024-04-03 ENCOUNTER — Telehealth: Payer: Self-pay | Admitting: Internal Medicine

## 2024-04-03 NOTE — Telephone Encounter (Unsigned)
 Copied from CRM #[id number]. Topic: Clinical - Prescription Issue >> Mar 26, 2024  8:39 AM Celestine FALCON wrote: Reason for CRM: Pt is requesting Dr. Meade to fill a prescription for Pleurx drainage kit with 1000 ml bottles, but I looked at the pt's chart and I couldn't locate that medication. I also checked to see if it was discontinued medication, but I couldn't find it there either.  Pt stated she has fluid in her lungs that this helps draw it out. Pt stated it gets shipped to her through [employer]. Pt stated she got these after having surgery.   Pt is requesting a phone call.The pt's phone number is [phone] ok to leave a vm. >> Apr 03, 2024 11:40 AM Russell PARAS wrote: Eleanor, with [facility], is contacting clinic regarding order that was sent to them requesting Pleurx drainage kit for pt in 12/2023. She reports sending the request back for corrections on 08/18; however, after verifying fax number it was sent to the wrong contact. Provided correct fax number and Eleanor will fax form over today, is needing corrections made and faxed back to them  CB#  [phone], ext [phone]  FX#  [phone]

## 2024-04-05 NOTE — Telephone Encounter (Signed)
 Patient says [facility] [employer] said that they are just waiting for something from the doctor to ship patient supplies . Patient has a number for the doctor or nurse to call so patient can get her supplies shipped  [phone] anybody that answer the phone can help .  This is from [employer]  Last Thursday patient say ahe spoke with nurse heather and everything was suppose to be ready to order but patient is still having issues with [employer] they say that they are waiting on some more paperwork . Patient says she need these supplies so she can drain her right lung Pleas reach out to patient

## 2024-04-05 NOTE — Telephone Encounter (Signed)
 Patient is calling cause the people from [employer] called patient and told her Jenna [name] is her provider. And patient said she didn't know her . They gave patient a number to speak with Jenna [name] but its not working . The number [phone] they gave her . Who ordered the drainage kit

## 2024-04-05 NOTE — Telephone Encounter (Signed)
 Patient is calling back because [facility] says they was waiting for some forms to be faxed back for patient to receive supplies . [facility] told patient today that they still have not received the forms or the order they are waiting on to ship supplies to patient . Please give patient a call concerning this issue . Patient is very confused on what's going on . [facility] called patient today and asked about a tammy parrett and patient didn't know what they were talking about . Please reach out to patient for further assistance [phone].

## 2024-04-06 ENCOUNTER — Telehealth: Payer: Self-pay

## 2024-04-06 NOTE — Telephone Encounter (Signed)
 Spoke w/ PT who also spoke w/ heather the day before [facility] said she just needs to call them to reorder supplies this has been explained to pt several times .   Called [facility] they would like a new written order to be faxed. [phone])   Signed by DOD Faxed   Requested for them to let us  know if they have received faxed   Fax on our end shows received & a copy has been sent to scan.    Spoke w/ patient to let her know.   -NFN

## 2024-04-09 ENCOUNTER — Telehealth: Payer: Self-pay

## 2024-04-09 ENCOUNTER — Ambulatory Visit (INDEPENDENT_AMBULATORY_CARE_PROVIDER_SITE_OTHER): Admitting: "Endocrinology

## 2024-04-09 ENCOUNTER — Encounter: Payer: Self-pay | Admitting: "Endocrinology

## 2024-04-09 VITALS — BP 120/80 | HR 84 | Ht 65.0 in | Wt 194.0 lb

## 2024-04-09 DIAGNOSIS — Z7984 Long term (current) use of oral hypoglycemic drugs: Secondary | ICD-10-CM

## 2024-04-09 DIAGNOSIS — C3490 Malignant neoplasm of unspecified part of unspecified bronchus or lung: Secondary | ICD-10-CM | POA: Diagnosis not present

## 2024-04-09 DIAGNOSIS — E7800 Pure hypercholesterolemia, unspecified: Secondary | ICD-10-CM

## 2024-04-09 DIAGNOSIS — Z794 Long term (current) use of insulin: Secondary | ICD-10-CM | POA: Diagnosis not present

## 2024-04-09 DIAGNOSIS — J918 Pleural effusion in other conditions classified elsewhere: Secondary | ICD-10-CM | POA: Diagnosis not present

## 2024-04-09 DIAGNOSIS — E1122 Type 2 diabetes mellitus with diabetic chronic kidney disease: Secondary | ICD-10-CM | POA: Diagnosis not present

## 2024-04-09 LAB — POCT GLYCOSYLATED HEMOGLOBIN (HGB A1C): Hemoglobin A1C: 6.5 % — AB (ref 4.0–5.6)

## 2024-04-09 MED ORDER — TIRZEPATIDE 2.5 MG/0.5ML ~~LOC~~ SOAJ
2.5000 mg | SUBCUTANEOUS | 0 refills | Status: AC
Start: 2024-04-09 — End: ?

## 2024-04-09 NOTE — Telephone Encounter (Signed)
 Spoke w/ PT  and [facility]   Forms have been received, norleen of [facility] answering service  called warehouse  Waiting on fedex to pickup    And should be at PT home by Wednesday   Spoke w/ pt to let her know    - NFN

## 2024-04-09 NOTE — Patient Instructions (Signed)

## 2024-04-09 NOTE — Progress Notes (Signed)
 Outpatient Endocrinology Note Jenna [name]  04/09/24   Jenna [name] 02-23-1946 [id number]  Referring Provider: [name] [name], * Primary Care Provider: [name] LABOR, [name] Reason for consultation: Subjective   Assessment & Plan  Diagnoses and all orders for this visit:  Type 2 diabetes mellitus with chronic kidney disease, with long-term current use of insulin , unspecified CKD stage (HCC) -     POCT glycosylated hemoglobin (Hb A1C)  Long term (current) use of oral hypoglycemic drugs  Long-term insulin  use (HCC)  Pure hypercholesterolemia  Other orders -     tirzepatide  (MOUNJARO ) 2.5 MG/0.5ML Pen; Inject 2.5 mg into the skin once a week.   Diabetes complicated by neuropathy  Hba1c goal less than 7.0, current Hba1c is Lab Results  Component Value Date   HGBA1C 6.5 (A) 04/09/2024   HGBA1C 6.6 (H) 12/07/2023   HGBA1C 6.1 (A) 09/12/2023    Will recommend the following: Lantus  25 units qam-skip if BG <100 Farxiga  10 mg every day 04/09/24: Start mounajro 2.5 mg/week Encouraged patient to check BG at home, gave log, staff helped putting her G7 on Discussed important of checking BG, skipping lantus  if BG <100   No history of MEN syndrome/medullary thyroid  cancer/pancreatitis or pancreatic cancer in self or family No known contraindications to any of above medications  Hyperlipidemia -Last LDL at goal: 33 -on pravastatin  20 mg QD -Follow low fat diet and exercise   -Blood pressure goal <140/90 - Microalbumin/creatinine off goal at 623 - not on ACE/ARB - taken off of Losartan  50 mg qd due to hyperkalemia requiring hospitalization  -diet changes including salt restriction -limit eating outside -counseled BP targets per standards of diabetes care -Uncontrolled blood pressure can lead to retinopathy, nephropathy and cardiovascular and atherosclerotic heart disease  Reviewed and counseled on: -A1C target -Blood sugar  targets -Complications of uncontrolled diabetes  -Checking blood sugar before meals and bedtime and bring log next visit -All medications with mechanism of action and side effects -Hypoglycemia management: rule of 15's, Glucagon Emergency Kit and medical alert ID -low-carb low-fat plate-method diet -At least 20 minutes of physical activity per day -Annual dilated retinal eye exam and foot exam -compliance and follow up needs -follow up as scheduled or earlier if problem gets worse  Call if blood sugar is less than 70 or consistently above 250    Take a 15 gm snack of carbohydrate at bedtime before you go to sleep if your blood sugar is less than 100.    If you are going to fast after midnight for a test or procedure, ask your physician for instructions on how to reduce/decrease your insulin  dose.    Call if blood sugar is less than 70 or consistently above 250  -Treating a low sugar by rule of 15  (15 gms of sugar every 15 min until sugar is more than 70) If you feel your sugar is low, test your sugar to be sure If your sugar is low (less than 70), then take 15 grams of a fast acting Carbohydrate (3-4 glucose tablets or glucose gel or 4 ounces of juice or regular soda) Recheck your sugar 15 min after treating low to make sure it is more than 70 If sugar is still less than 70, treat again with 15 grams of carbohydrate          Don't drive the hour of hypoglycemia  If unconscious/unable to eat or drink by mouth, use glucagon injection or nasal spray baqsimi  and call 911. Can repeat again in 15 min if still unconscious.  Return in about 2 months (around 06/09/2024).   I have reviewed current medications, nurse's notes, allergies, vital signs, past medical and surgical history, family medical history, and social history for this encounter. Counseled patient on symptoms, examination findings, lab findings, imaging results, treatment decisions and monitoring and prognosis. The patient  understood the recommendations and agrees with the treatment plan. All questions regarding treatment plan were fully answered.  Jenna [name]  04/09/24   History of Present Illness Jenna [name] is a 78 y.o. year old female who presents for follow up for Type 2 diabetes mellitus.  Jenna [name] was first diagnosed in 2000.   Diabetes education +  Home diabetes regimen: Lantus  30 units qam Farxiga  10 mg qd  COMPLICATIONS -  MI/Stroke, + PAD -  retinopathy -  neuropathy +  nephropathy, Cr 1.5  BLOOD SUGAR DATA Not checking sugars  No CGM on Reports BG <100  Physical Exam  BP 120/80   Pulse 84   Ht 5' 5 (1.651 m)   Wt 194 lb (88 kg)   SpO2 96%   BMI 32.28 kg/m    Constitutional: well developed, well nourished Head: normocephalic, atraumatic Eyes: sclera anicteric, no redness Neck: supple Lungs: normal respiratory effort Neurology: alert and oriented Skin: dry, no appreciable rashes Musculoskeletal: no appreciable defects Psychiatric: normal mood and affect  Current Medications Patient's Medications  New Prescriptions   TIRZEPATIDE  (MOUNJARO ) 2.5 MG/0.5ML PEN    Inject 2.5 mg into the skin once a week.  Previous Medications   ACCU-CHEK SOFTCLIX LANCETS LANCETS    Use as instructed   ALBUTEROL  (VENTOLIN  HFA) 108 (90 BASE) MCG/ACT INHALER    Inhale 1-2 puffs into the lungs every 6 (six) hours as needed for wheezing or shortness of breath.   BLOOD GLUCOSE METER KIT AND SUPPLIES    Dispense based on patient and insurance preference. Use up to four times daily as directed. (FOR ICD-10 E10.9, E11.9).   BLOOD GLUCOSE METER KIT AND SUPPLIES KIT    Use up to four times daily as directed/accu check guide kit   CONTINUOUS GLUCOSE SENSOR (DEXCOM G7 SENSOR) MISC    1 Device by Does not apply route continuous.   DIPHENHYDRAMINE  (BENADRYL ) 2 % CREAM    Apply 1 application topically as needed for itching.   FARXIGA  10 MG TABS TABLET    Take 1 tablet (10 mg  total) by mouth daily before breakfast. Follow-up appt due in Nov must see provider for future refills   FERROUS SULFATE  (IRON ) 325 (65 FE) MG TABS    Take 1 tablet (325 mg total) by mouth 2 (two) times daily with a meal.   FLUTICASONE  FUROATE-VILANTEROL (BREO ELLIPTA ) 200-25 MCG/ACT AEPB    Inhale 1 puff into the lungs daily.   FUROSEMIDE  (LASIX ) 20 MG TABLET    Take 1 tablet (20 mg total) by mouth daily as needed.   GLUCOSE BLOOD (BLOOD GLUCOSE TEST STRIPS 333) STRP    1 strip by In Vitro route 4 (four) times daily.   INSULIN  GLARGINE (LANTUS  SOLOSTAR) 100 UNIT/ML SOLOSTAR PEN    Inject 34 Units into the skin daily. And 31G, 5mm pen needles 1/day   INSULIN  PEN NEEDLE 32G X 4 MM MISC    Inject 1 Needle into the skin every morning.   LEVOTHYROXINE  (SYNTHROID ) 75 MCG TABLET    Take 1 tablet (75 mcg total) by mouth daily.  MECLIZINE  (ANTIVERT ) 25 MG TABLET    Take 1 tablet (25 mg total) by mouth 3 (three) times daily as needed for dizziness.   METOPROLOL  SUCCINATE (TOPROL  XL) 25 MG 24 HR TABLET    Take 0.5 tablets (12.5 mg total) by mouth daily.   MULTIPLE VITAMIN (MULTIVITAMIN) CAPSULE    Take 1 capsule by mouth daily.   PRAVASTATIN  (PRAVACHOL ) 20 MG TABLET    Take 1 tablet (20 mg total) by mouth daily.  Modified Medications   No medications on file  Discontinued Medications   No medications on file    Allergies Allergies  Allergen Reactions   Paclitaxel  Other (See Comments)    Unresponsiveness shortly after Taxol  inf started 06/12/18.   Aspirin Other (See Comments)    Stomach bleeding    Esomeprazole Magnesium     UNSPECIFIED REACTION    Losartan      AKI, hyperkalemia   Ace Inhibitors Other (See Comments)    Dizziness, drunk like    Past Medical History Past Medical History:  Diagnosis Date   Blood transfusion without reported diagnosis    Cataract    DM (diabetes mellitus) (HCC)    Dyspnea    GERD (gastroesophageal reflux disease)    Hyperlipidemia    Hypertension    Iron   deficiency anemia    NSCL ca dx'd 03/2018   Pneumonia    Thyroid  disease     Past Surgical History Past Surgical History:  Procedure Laterality Date   BIOPSY  12/09/2022   Procedure: BIOPSY;  Surgeon: Leigh Elspeth SQUIBB, [name];  Location: [facility];  Service: Gastroenterology;;   BRONCHIAL BIOPSY  04/05/2023   Procedure: BRONCHIAL BIOPSIES;  Surgeon: Kassie Acquanetta Bradley, [name];  Location: [facility];  Service: Cardiopulmonary;;   BRONCHIAL BRUSHINGS  04/05/2023   Procedure: BRONCHIAL BRUSHINGS;  Surgeon: Kassie Acquanetta Bradley, [name];  Location: [facility];  Service: Cardiopulmonary;;   BRONCHIAL WASHINGS  04/05/2023   Procedure: BRONCHIAL WASHINGS;  Surgeon: Kassie Acquanetta Bradley, [name];  Location: [facility];  Service: Cardiopulmonary;;   CATARACT EXTRACTION Right    CHOLECYSTECTOMY     COLONOSCOPY  10/24/2009   normal rectum/1X1cm abnormal lesion in the ascending colon (bx benign). TI normal for 10cm.  Prep difficult/inadequate. f/u TCS 09/2012 recommended   COLONOSCOPY  10/13/2004   Normal rectum/Diminutive polyps, splenic flexure, cold biopsied/removed.  Remainder of colonic mucosa appeared normal.   COLONOSCOPY N/A 12/14/2012   MFM:Rnonwpr polyp-tubular adenoma   COLONOSCOPY WITH PROPOFOL  N/A 12/09/2022   Procedure: COLONOSCOPY WITH PROPOFOL ;  Surgeon: Leigh Elspeth SQUIBB, [name];  Location: [facility];  Service: Gastroenterology;  Laterality: N/A;   ESOPHAGOGASTRODUODENOSCOPY  10/13/2004    Normal esophagus/ Nodular volcano like lesion in the antrum, either representing a  pancreatic rest or leiomyoma, biopsied.  Remainder of the gastric mucosa appeared normal, normal D1-D2   ESOPHAGOGASTRODUODENOSCOPY  10/24/2009   Benign biopsies. normal esophagus/small hiatal hernia/nodular lesion antrum/distal greater curvature. duodenal AVM s/p ablation   GIVENS CAPSULE STUDY  07/27/2010    multiple arteriovenous malformations which could definitely be the contributor to her drifting hemoglobin and hematocrit    HEMOSTASIS CONTROL  04/05/2023   Procedure: HEMOSTASIS CONTROL;  Surgeon: Kassie Acquanetta Bradley, [name];  Location: [facility];  Service: Cardiopulmonary;;   HOT HEMOSTASIS N/A 12/09/2022   Procedure: HOT HEMOSTASIS (ARGON PLASMA COAGULATION/BICAP);  Surgeon: Leigh Elspeth SQUIBB, [name];  Location: [facility];  Service: Gastroenterology;  Laterality: N/A;   IR PERC PLEURAL DRAIN W/[facility] CATH W/IMG GUIDE  01/03/2024   IR THORACENTESIS ASP  PLEURAL SPACE W/IMG GUIDE  12/16/2020   POLYPECTOMY  12/09/2022   Procedure: POLYPECTOMY;  Surgeon: Leigh Elspeth SQUIBB, [name];  Location: [facility];  Service: Gastroenterology;;   THORACENTESIS  04/05/2023   Procedure: THORACENTESIS;  Surgeon: Kassie Acquanetta Bradley, [name];  Location: [facility];  Service: Cardiopulmonary;;   THORACENTESIS Right 06/08/2023   Procedure: MILANA;  Surgeon: Claudene Toribio BROCKS, [name];  Location: [facility];  Service: Pulmonary;  Laterality: Right;   THORACENTESIS Right 08/29/2023   Procedure: THORACENTESIS;  Surgeon: Meade Verdon RAMAN, [name];  Location: [facility] END[facility]COPY;  Service: Pulmonary;  Laterality: Right;   TUBAL LIGATION     US  ECHOCARDIOGRAPHY     VIDEO BRONCHOSCOPY N/A 04/05/2023   Procedure: VIDEO BRONCHOSCOPY WITHOUT FLUORO;  Surgeon: Kassie Acquanetta Bradley, [name];  Location: [facility];  Service: Cardiopulmonary;  Laterality: N/A;   VIDEO BRONCHOSCOPY WITH ENDOBRONCHIAL ULTRASOUND N/A 04/27/2018   Procedure: VIDEO BRONCHOSCOPY WITH ENDOBRONCHIAL ULTRASOUND;  Surgeon: Kerrin Elspeth BROCKS, [name];  Location: [facility] OR;  Service: Thoracic;  Laterality: N/A;    Family History family history includes Breast cancer in her cousin; Colon cancer in her maternal aunt; Diabetes in her daughter, daughter, and sister.  Social History Social History   Socioeconomic History   Marital status: Single    Spouse name: Not on file   Number of children: 3   Years of education: Not on file   Highest education level: Not on file  Occupational History    Occupation: retired  Tobacco Use   Smoking status: Former    Current packs/day: 0.00    Average packs/day: 0.5 packs/day for 55.0 years (27.5 ttl pk-yrs)    Types: Cigarettes    Start date: 03/19/1963    Quit date: 03/18/2018    Years since quitting: 6.0   Smokeless tobacco: Never   Tobacco comments:    smoked off and n  Vaping Use   Vaping status: Never Used  Substance and Sexual Activity   Alcohol  use: No   Drug use: No   Sexual activity: Not Currently  Other Topics Concern   Not on file  Social History Narrative   Patient fully vaccinated      Lives alone/2025   Social Drivers of Health   Financial Resource Strain: Low Risk  (01/17/2024)   Overall Financial Resource Strain (CARDIA)    Difficulty of Paying Living Expenses: Not hard at all  Food Insecurity: No Food Insecurity (01/17/2024)   Hunger Vital Sign    Worried About Running Out of Food in the Last Year: Never true    Ran Out of Food in the Last Year: Never true  Transportation Needs: No Transportation Needs (01/17/2024)   PRAPARE - Administrator, Civil Service (Medical): No    Lack of Transportation (Non-Medical): No  Physical Activity: Inactive (01/17/2024)   Exercise Vital Sign    Days of Exercise per Week: 0 days    Minutes of Exercise per Session: 0 min  Stress: No Stress Concern Present (01/17/2024)   [employer] of Occupational Health - Occupational Stress Questionnaire    Feeling of Stress : Not at all  Social Connections: Socially Isolated (01/17/2024)   Social Connection and Isolation Panel    Frequency of Communication with Friends and Family: More than three times a week    Frequency of Social Gatherings with Friends and Family: More than three times a week    Attends Religious Services: Never    Database administrator or Organizations: No  Attends Banker Meetings: Never    Marital Status: Never married  Intimate Partner Violence: Patient Unable To Answer (01/17/2024)    Humiliation, Afraid, Rape, and Kick questionnaire    Fear of Current or Ex-Partner: Patient unable to answer    Emotionally Abused: Patient unable to answer    Physically Abused: Patient unable to answer    Sexually Abused: Patient unable to answer    Lab Results  Component Value Date   HGBA1C 6.5 (A) 04/09/2024   HGBA1C 6.6 (H) 12/07/2023   HGBA1C 6.1 (A) 09/12/2023   Lab Results  Component Value Date   CHOL 135 12/07/2023   Lab Results  Component Value Date   HDL 44 (L) 12/07/2023   Lab Results  Component Value Date   LDLCALC 77 12/07/2023   Lab Results  Component Value Date   TRIG 65 12/07/2023   Lab Results  Component Value Date   CHOLHDL 3.1 12/07/2023   Lab Results  Component Value Date   CREATININE 1.53 (H) 02/09/2024   Lab Results  Component Value Date   GFR 32.58 (L) 02/09/2024   Lab Results  Component Value Date   MICROALBUR 62.9 12/07/2023      Component Value Date/Time   NA 138 02/09/2024 1025   K 4.5 02/09/2024 1025   CL 105 02/09/2024 1025   CO2 26 02/09/2024 1025   GLUCOSE 134 (H) 02/09/2024 1025   BUN 28 (H) 02/09/2024 1025   BUN 24 (A) 02/12/2020 0000   CREATININE 1.53 (H) 02/09/2024 1025   CREATININE 1.49 (H) 12/07/2023 0811   CALCIUM  9.2 02/09/2024 1025   PROT 8.3 02/09/2024 1025   ALBUMIN 3.5 02/09/2024 1025   ALBUMIN 4.3 12/10/2011 0919   AST 26 02/09/2024 1025   AST 24 11/10/2023 1056   ALT 28 02/09/2024 1025   ALT 19 11/10/2023 1056   ALKPHOS 150 (H) 02/09/2024 1025   ALKPHOS 88 12/10/2011 0919   BILITOT 0.3 02/09/2024 1025   BILITOT 0.3 11/10/2023 1056   GFRNONAA 30 (L) 11/10/2023 1056   GFRAA 43 (L) 02/12/2020 0941      Latest Ref Rng & Units 02/09/2024   10:25 AM 12/07/2023    8:11 AM 11/10/2023   10:56 AM  BMP  Glucose 70 - 99 mg/dL 865  65  876   BUN 6 - 23 mg/dL 28  32  32   Creatinine 0.40 - 1.20 mg/dL 8.46  8.50  8.28   BUN/Creat Ratio 6 - 22 (calc)  21    Sodium 135 - 145 mEq/L 138  139  141   Potassium 3.5  - 5.1 mEq/L 4.5  4.9  4.8   Chloride 96 - 112 mEq/L 105  109  111   CO2 19 - 32 mEq/L 26  23  24    Calcium  8.4 - 10.5 mg/dL 9.2  9.1  9.3        Component Value Date/Time   WBC 6.0 02/09/2024 1025   RBC 4.19 02/09/2024 1025   HGB 11.1 (L) 02/09/2024 1025   HGB 11.6 (L) 11/10/2023 1056   HGB 8.1 (L) 12/27/2017 1123   HCT 35.0 (L) 02/09/2024 1025   HCT 24.4 (L) 12/27/2017 1123   PLT 266.0 02/09/2024 1025   PLT 174 11/10/2023 1056   PLT 296 12/27/2017 1123   MCV 83.5 02/09/2024 1025   MCV 70 (L) 12/27/2017 1123   MCH 27.0 11/10/2023 1056   MCHC 31.8 02/09/2024 1025   RDW 15.8 (H) 02/09/2024 1025  RDW 17.0 (H) 12/27/2017 1123   LYMPHSABS 0.6 (L) 02/09/2024 1025   LYMPHSABS 0.8 12/27/2017 1123   MONOABS 0.3 02/09/2024 1025   EOSABS 0.1 02/09/2024 1025   EOSABS 0.1 12/27/2017 1123   BASOSABS 0.0 02/09/2024 1025   BASOSABS 0.0 12/27/2017 1123     Parts of this note may have been dictated using voice recognition software. There may be variances in spelling and vocabulary which are unintentional. Not all errors are proofread. Please notify the dino if any discrepancies are noted or if the meaning of any statement is not clear.

## 2024-04-09 NOTE — Telephone Encounter (Signed)
 See phone encounter for 08/22

## 2024-04-26 ENCOUNTER — Ambulatory Visit: Admitting: Internal Medicine

## 2024-04-26 ENCOUNTER — Encounter: Payer: Self-pay | Admitting: Internal Medicine

## 2024-04-26 VITALS — BP 134/70 | HR 91 | Temp 99.2°F | Ht 65.0 in | Wt 200.0 lb

## 2024-04-26 DIAGNOSIS — R053 Chronic cough: Secondary | ICD-10-CM | POA: Diagnosis not present

## 2024-04-26 DIAGNOSIS — Z87891 Personal history of nicotine dependence: Secondary | ICD-10-CM

## 2024-04-26 DIAGNOSIS — C3491 Malignant neoplasm of unspecified part of right bronchus or lung: Secondary | ICD-10-CM

## 2024-04-26 DIAGNOSIS — J9819 Other pulmonary collapse: Secondary | ICD-10-CM

## 2024-04-26 DIAGNOSIS — Z85118 Personal history of other malignant neoplasm of bronchus and lung: Secondary | ICD-10-CM | POA: Diagnosis not present

## 2024-04-26 DIAGNOSIS — J90 Pleural effusion, not elsewhere classified: Secondary | ICD-10-CM

## 2024-04-26 DIAGNOSIS — R0602 Shortness of breath: Secondary | ICD-10-CM

## 2024-04-26 DIAGNOSIS — J432 Centrilobular emphysema: Secondary | ICD-10-CM

## 2024-04-26 NOTE — Progress Notes (Signed)
 Jenna [name]    [id number]    1945-11-16  Primary Care Physician:Crawford, Almarie LABOR, MD Date of Appointment: 04/26/2024 Established Patient Visit  Chief complaint:   Chief Complaint  Patient presents with   Follow-up    Patient is fatigue    Shortness of Breath    No sob     HPI: Jenna [name] is a 78 y.o. woman with h/o tobacco use disorder and mild emphysema. 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   Interval Updates: Here for pleurx follow up. Color has gone back to yellow clear.  Draining about once a week now and it is at 250cc.   No pain.  Has ct scan followed up She is fatigued.  Doesn't feel inhalers are helping.   Overall feels shortness of breath is better since pleurx placement and regular drainage.   I have reviewed the patient's family social and past medical history and updated as appropriate.   Past Medical History:  Diagnosis Date   Blood transfusion without reported diagnosis    Cataract    DM (diabetes mellitus) (HCC)    Dyspnea    GERD (gastroesophageal reflux disease)    Hyperlipidemia    Hypertension    Iron  deficiency anemia    NSCL ca dx'd 03/2018   Pneumonia    Thyroid  disease     Past Surgical History:  Procedure Laterality Date   BIOPSY  12/09/2022   Procedure: BIOPSY;  Surgeon: Leigh Elspeth SQUIBB, MD;  Location: [facility];  Service: Gastroenterology;;   BRONCHIAL BIOPSY  04/05/2023   Procedure: BRONCHIAL BIOPSIES;  Surgeon: Kassie Acquanetta Bradley, MD;  Location: [facility];  Service: Cardiopulmonary;;   BRONCHIAL BRUSHINGS  04/05/2023   Procedure: BRONCHIAL BRUSHINGS;  Surgeon: Kassie Acquanetta Bradley, MD;  Location: [facility];  Service: Cardiopulmonary;;   BRONCHIAL WASHINGS  04/05/2023   Procedure: BRONCHIAL WASHINGS;  Surgeon: Kassie Acquanetta Bradley, MD;  Location: [facility];   Service: Cardiopulmonary;;   CATARACT EXTRACTION Right    CHOLECYSTECTOMY     COLONOSCOPY  10/24/2009   normal rectum/1X1cm abnormal lesion in the ascending colon (bx benign). TI normal for 10cm.  Prep difficult/inadequate. f/u TCS 09/2012 recommended   COLONOSCOPY  10/13/2004   Normal rectum/Diminutive polyps, splenic flexure, cold biopsied/removed.  Remainder of colonic mucosa appeared normal.   COLONOSCOPY N/A 12/14/2012   MFM:Rnonwpr polyp-tubular adenoma   COLONOSCOPY WITH PROPOFOL  N/A 12/09/2022   Procedure: COLONOSCOPY WITH PROPOFOL ;  Surgeon: Leigh Elspeth SQUIBB, MD;  Location: [facility];  Service: Gastroenterology;  Laterality: N/A;   ESOPHAGOGASTRODUODENOSCOPY  10/13/2004    Normal esophagus/ Nodular volcano like lesion in the antrum, either representing a  pancreatic rest or leiomyoma, biopsied.  Remainder of the gastric mucosa appeared normal, normal D1-D2   ESOPHAGOGASTRODUODENOSCOPY  10/24/2009   Benign biopsies. normal esophagus/small hiatal hernia/nodular lesion antrum/distal greater curvature. duodenal AVM s/p ablation   GIVENS CAPSULE STUDY  07/27/2010    multiple arteriovenous malformations which could definitely be the contributor to her drifting hemoglobin and hematocrit   HEMOSTASIS CONTROL  04/05/2023   Procedure: HEMOSTASIS CONTROL;  Surgeon: Kassie Acquanetta Bradley, MD;  Location: [facility];  Service: Cardiopulmonary;;   HOT HEMOSTASIS N/A 12/09/2022   Procedure: HOT HEMOSTASIS (ARGON PLASMA COAGULATION/BICAP);  Surgeon: Leigh Elspeth SQUIBB, MD;  Location: [facility];  Service: Gastroenterology;  Laterality: N/A;   IR PERC PLEURAL  DRAIN W/INDWELL CATH W/IMG GUIDE  01/03/2024   IR THORACENTESIS ASP PLEURAL SPACE W/IMG GUIDE  12/16/2020   POLYPECTOMY  12/09/2022   Procedure: POLYPECTOMY;  Surgeon: Leigh Elspeth SQUIBB, MD;  Location: [facility];  Service: Gastroenterology;;   THORACENTESIS  04/05/2023   Procedure: THORACENTESIS;  Surgeon: Kassie Acquanetta Bradley, MD;   Location: [facility];  Service: Cardiopulmonary;;   THORACENTESIS Right 06/08/2023   Procedure: MILANA;  Surgeon: Claudene Toribio BROCKS, MD;  Location: [facility];  Service: Pulmonary;  Laterality: Right;   THORACENTESIS Right 08/29/2023   Procedure: THORACENTESIS;  Surgeon: Meade Verdon RAMAN, MD;  Location: [facility];  Service: Pulmonary;  Laterality: Right;   TUBAL LIGATION     US  ECHOCARDIOGRAPHY     VIDEO BRONCHOSCOPY N/A 04/05/2023   Procedure: VIDEO BRONCHOSCOPY WITHOUT FLUORO;  Surgeon: Kassie Acquanetta Bradley, MD;  Location: [facility];  Service: Cardiopulmonary;  Laterality: N/A;   VIDEO BRONCHOSCOPY WITH ENDOBRONCHIAL ULTRASOUND N/A 04/27/2018   Procedure: VIDEO BRONCHOSCOPY WITH ENDOBRONCHIAL ULTRASOUND;  Surgeon: Kerrin Elspeth BROCKS, MD;  Location: [facility];  Service: Thoracic;  Laterality: N/A;    Family History  Problem Relation Age of Onset   Diabetes Sister    Colon cancer Maternal Aunt        greater than age 55   Breast cancer Cousin    Diabetes Daughter    Diabetes Daughter    Amblyopia Neg Hx    Blindness Neg Hx    Cataracts Neg Hx    Glaucoma Neg Hx    Macular degeneration Neg Hx    Retinal detachment Neg Hx    Strabismus Neg Hx    Retinitis pigmentosa Neg Hx    Rectal cancer Neg Hx    Stomach cancer Neg Hx    Colon polyps Neg Hx    Esophageal cancer Neg Hx     Social History   Occupational History   Occupation: retired  Tobacco Use   Smoking status: Former    Current packs/day: 0.00    Average packs/day: 0.5 packs/day for 55.0 years (27.5 ttl pk-yrs)    Types: Cigarettes    Start date: 03/19/1963    Quit date: 03/18/2018    Years since quitting: 6.1   Smokeless tobacco: Never   Tobacco comments:    smoked off and n  Vaping Use   Vaping status: Never Used  Substance and Sexual Activity   Alcohol  use: No   Drug use: No   Sexual activity: Not Currently     Physical Exam: Blood pressure 134/70, pulse 91, temperature 99.2 F (37.3 C),  temperature source Oral, height 5' 5 (1.651 m), weight 200 lb (90.7 kg), SpO2 96%.  Gen:      No distress Lungs:  breath sounds diminished right lung base. Pleurx in place, skin appears normal in surrounding area, no wheeze CV:         RRR no mrg   Data Reviewed: Imaging: CT Chest March 2025 shows recurrent right sided pleural effusion  PFTs:     Latest Ref Rng & Units 03/15/2022   10:43 AM 05/11/2018    2:33 PM  PFT Results  FVC-Pre L 1.23    FVC-Predicted Pre % 41  71   FVC-Post L 1.13  1.77   FVC-Predicted Post % 38  73   Pre FEV1/FVC % % 80  83   Post FEV1/FCV % % 80  82   FEV1-Pre L 0.98  1.44   FEV1-Predicted Pre % 44  76  FEV1-Post L 0.91  1.46   DLCO uncorrected ml/min/mmHg 7.37  10.35   DLCO UNC% % 37  40   DLCO corrected ml/min/mmHg 7.37  12.65   DLCO COR %Predicted % 37  49   DLVA Predicted % 64  79   TLC L 3.62  4.30   TLC % Predicted % 69  82   RV % Predicted % 88  104    I have personally reviewed the patient's PFTs and show severe restriction to ventilation with reduced diffusion capacity.   Labs: Lab Results  Component Value Date   NA 138 02/09/2024   K 4.5 02/09/2024   CO2 26 02/09/2024   GLUCOSE 134 (H) 02/09/2024   BUN 28 (H) 02/09/2024   CREATININE 1.53 (H) 02/09/2024   CALCIUM  9.2 02/09/2024   GFR 32.58 (L) 02/09/2024   EGFR 36 (L) 12/07/2023   GFRNONAA 30 (L) 11/10/2023   Lab Results  Component Value Date   WBC 6.0 02/09/2024   HGB 11.1 (L) 02/09/2024   HCT 35.0 (L) 02/09/2024   MCV 83.5 02/09/2024   PLT 266.0 02/09/2024    Immunization status: Immunization History  Administered Date(s) Administered   Fluad Quad(high Dose 65+) 05/31/2019, 09/12/2020   Influenza-Unspecified 04/16/2014   PFIZER(Purple Top)SARS-COV-2 Vaccination 11/22/2019, 12/17/2019   Pneumococcal Conjugate-13 06/10/2014   Pneumococcal Polysaccharide-23 04/19/2013    External Records Personally Reviewed: oncology, primary care.   Assessment:  NSCLC Stage  3B s/p chemotherapy and radiation  Shortness of breath, likely related to radiation fibrosis given severe restriction with reduced dlco. Now worsened in the setting of right lung collapse Recurrent Right sided pleural effusion Emphysema, mild History of tobacco use, quit 2019 Chronic cough, likely reflux  Plan/Recommendations: Likely multifactorial dyspnea from chronic RLL collapse secondary to post - radiation changes, recurrent pleural effusion.   Continue draining once a week. I will follow up on your CT scan later this month and if the low drainage continues and that looks ok, we can discontinue the pleurx. I can remove it in the office.   If inhalers are not helping you can stop taking them.   Return to Care: Return in about 2 months (around 06/26/2024).   [name], MD Pulmonary and Critical Care Medicine [facility] Office:[phone]

## 2024-04-26 NOTE — Patient Instructions (Signed)
 It was a pleasure to see you today!  Please schedule follow up with myself in 2 months.  If my schedule is not open yet, we will contact you with a reminder closer to that time. Please call [phone] if you haven't heard from us  a month before, and always call us  sooner if issues or concerns arise. You can also send us  a message through MyChart, but but aware that this is not to be used for urgent issues and it may take up to 5-7 days to receive a reply. Please be aware that you will likely be able to view your results before I have a chance to respond to them. Please give us  5 business days to respond to any non-urgent results.     Continue draining once a week. I will follow up on your CT scan later this month and if the low drainage continues and that looks ok, we can discontinue the pleurx. I can remove it in the office.   If inhalers are not helping you can stop taking them.

## 2024-05-01 ENCOUNTER — Ambulatory Visit (HOSPITAL_COMMUNITY)
Admission: RE | Admit: 2024-05-01 | Discharge: 2024-05-01 | Disposition: A | Discharge status: Home / Self Care | Source: Ambulatory Visit | Attending: Internal Medicine | Admitting: Internal Medicine

## 2024-05-01 ENCOUNTER — Inpatient Hospital Stay: Attending: Internal Medicine

## 2024-05-01 DIAGNOSIS — Z923 Personal history of irradiation: Secondary | ICD-10-CM | POA: Diagnosis not present

## 2024-05-01 DIAGNOSIS — C3490 Malignant neoplasm of unspecified part of unspecified bronchus or lung: Secondary | ICD-10-CM | POA: Insufficient documentation

## 2024-05-01 DIAGNOSIS — Z85118 Personal history of other malignant neoplasm of bronchus and lung: Secondary | ICD-10-CM | POA: Diagnosis not present

## 2024-05-01 DIAGNOSIS — J948 Other specified pleural conditions: Secondary | ICD-10-CM | POA: Insufficient documentation

## 2024-05-01 DIAGNOSIS — Z9221 Personal history of antineoplastic chemotherapy: Secondary | ICD-10-CM | POA: Insufficient documentation

## 2024-05-01 DIAGNOSIS — R590 Localized enlarged lymph nodes: Secondary | ICD-10-CM | POA: Diagnosis not present

## 2024-05-01 LAB — CMP (CANCER CENTER ONLY)
ALT: 22 U/L (ref 0–44)
AST: 32 U/L (ref 15–41)
Albumin: 3.5 g/dL (ref 3.5–5.0)
Alkaline Phosphatase: 112 U/L (ref 38–126)
Anion gap: 5 (ref 5–15)
BUN: 29 mg/dL — ABNORMAL HIGH (ref 8–23)
CO2: 25 mmol/L (ref 22–32)
Calcium: 9 mg/dL (ref 8.9–10.3)
Chloride: 109 mmol/L (ref 98–111)
Creatinine: 1.44 mg/dL — ABNORMAL HIGH (ref 0.44–1.00)
GFR, Estimated: 37 mL/min — ABNORMAL LOW (ref >60)
Glucose, Bld: 142 mg/dL — ABNORMAL HIGH (ref 70–99)
Potassium: 4.9 mmol/L (ref 3.5–5.1)
Sodium: 139 mmol/L (ref 135–145)
Total Bilirubin: 0.3 mg/dL (ref 0.0–1.2)
Total Protein: 8.2 g/dL — ABNORMAL HIGH (ref 6.5–8.1)

## 2024-05-01 LAB — CBC WITH DIFFERENTIAL (CANCER CENTER ONLY)
Abs Immature Granulocytes: 0.03 K/uL (ref 0.00–0.07)
Basophils Absolute: 0 K/uL (ref 0.0–0.1)
Basophils Relative: 0 %
Eosinophils Absolute: 0.1 K/uL (ref 0.0–0.5)
Eosinophils Relative: 2 %
HCT: 32.4 % — ABNORMAL LOW (ref 36.0–46.0)
Hemoglobin: 9.8 g/dL — ABNORMAL LOW (ref 12.0–15.0)
Immature Granulocytes: 1 %
Lymphocytes Relative: 6 %
Lymphs Abs: 0.3 K/uL — ABNORMAL LOW (ref 0.7–4.0)
MCH: 26 pg (ref 26.0–34.0)
MCHC: 30.2 g/dL (ref 30.0–36.0)
MCV: 85.9 fL (ref 80.0–100.0)
Monocytes Absolute: 0.3 K/uL (ref 0.1–1.0)
Monocytes Relative: 7 %
Neutro Abs: 3.8 K/uL (ref 1.7–7.7)
Neutrophils Relative %: 84 %
Platelet Count: 220 K/uL (ref 150–400)
RBC: 3.77 MIL/uL — ABNORMAL LOW (ref 3.87–5.11)
RDW: 16.5 % — ABNORMAL HIGH (ref 11.5–15.5)
WBC Count: 4.5 K/uL (ref 4.0–10.5)
nRBC: 0 % (ref 0.0–0.2)

## 2024-05-10 ENCOUNTER — Inpatient Hospital Stay (HOSPITAL_BASED_OUTPATIENT_CLINIC_OR_DEPARTMENT_OTHER): Admitting: Internal Medicine

## 2024-05-10 VITALS — BP 163/61 | HR 91 | Temp 97.5°F | Resp 16 | Ht 65.0 in | Wt 198.0 lb

## 2024-05-10 DIAGNOSIS — J948 Other specified pleural conditions: Secondary | ICD-10-CM | POA: Diagnosis not present

## 2024-05-10 DIAGNOSIS — Z9221 Personal history of antineoplastic chemotherapy: Secondary | ICD-10-CM | POA: Diagnosis not present

## 2024-05-10 DIAGNOSIS — Z85118 Personal history of other malignant neoplasm of bronchus and lung: Secondary | ICD-10-CM | POA: Diagnosis not present

## 2024-05-10 DIAGNOSIS — Z923 Personal history of irradiation: Secondary | ICD-10-CM | POA: Diagnosis not present

## 2024-05-10 DIAGNOSIS — C3490 Malignant neoplasm of unspecified part of unspecified bronchus or lung: Secondary | ICD-10-CM | POA: Diagnosis not present

## 2024-05-10 NOTE — Progress Notes (Signed)
 [facility] [phone]   [phone]  OFFICE PROGRESS NOTE  Jenna [name] LABOR, MD [address] [place] [address]  DIAGNOSIS: Stage IIIB (T3, N3, M0) non-small cell lung cancer, squamous cell carcinoma presented with large right middle lobe lung breast in addition to mediastinal and bilateral hilar lymphadenopathy and suspicious right supraclavicular lymph node diagnosed in August 2019.    PRIOR THERAPY:  1) A course of concurrent chemoradiation with weekly carboplatin  for AUC of 2 and paclitaxel  45 MG/M2.  First dose of chemotherapy given on 05/29/2018.  Status post 5 cycles. 2) Consolidation treatment with immunotherapy with Imfinzi  10 mg/KG every 2 weeks.  First dose August 10, 2018.  Status post 26 cycles.   CURRENT THERAPY:  Observation.  INTERVAL HISTORY: Jenna [name] 78 y.o. [name] returns to the clinic today for follow-up visit. Discussed the use of AI scribe software for clinical note transcription with the patient, who gave verbal consent to proceed.  History of Present Illness Jenna [name] is a 78 year old [name] with stage 3B nonsmall cell lung cancer who presents for evaluation with repeat CT scan for restaging of her disease.  She was diagnosed with stage 3B nonsmall cell lung cancer in August 2019 and completed concurrent chemoradiation followed by one year of consolidation immunotherapy with durvalumab , finishing in November 2020. Since then, she has been under observation.  She currently has a right-sided chest tube due to fluid accumulation, which is drained weekly by her granddaughter. The presence of the tube affects her breathing and limits her ability to walk long distances, although she feels it is healing. She hopes for removal once the fluid resolves.  She reports no recent symptoms such as hemoptysis, nausea, vomiting, diarrhea, or headaches. Her breathing has improved since the fluid  drainage, though she remains aware of the tube's presence.  She currently has a right pleural drain in place.      MEDICAL HISTORY: Past Medical History:  Diagnosis Date   Blood transfusion without reported diagnosis    Cataract    DM (diabetes mellitus) (HCC)    Dyspnea    GERD (gastroesophageal reflux disease)    Hyperlipidemia    Hypertension    Iron  deficiency anemia    NSCL ca dx'd 03/2018   Pneumonia    Thyroid  disease     ALLERGIES:  is allergic to paclitaxel , aspirin, esomeprazole magnesium, losartan , and ace inhibitors.  MEDICATIONS:  Current Outpatient Medications  Medication Sig Dispense Refill   Accu-Chek Softclix Lancets lancets Use as instructed 100 each 12   albuterol  (VENTOLIN  HFA) 108 (90 Base) MCG/ACT inhaler Inhale 1-2 puffs into the lungs every 6 (six) hours as needed for wheezing or shortness of breath. 6.7 g 2   blood glucose meter kit and supplies KIT Use up to four times daily as directed/accu check guide kit 1 each 0   blood glucose meter kit and supplies Dispense based on patient and insurance preference. Use up to four times daily as directed. (FOR ICD-10 E10.9, E11.9). 1 each 0   Continuous Glucose Sensor (DEXCOM G7 SENSOR) MISC 1 Device by Does not apply route continuous. 9 each 0   diphenhydrAMINE  (BENADRYL ) 2 % cream Apply 1 application topically as needed for itching.     FARXIGA  10 MG TABS tablet Take 1 tablet (10 mg total) by mouth daily before breakfast. Follow-up appt due in Nov must see provider for future refills 90 tablet 3  Ferrous Sulfate  (IRON ) 325 (65 Fe) MG TABS Take 1 tablet (325 mg total) by mouth 2 (two) times daily with a meal. 180 tablet 0   fluticasone  furoate-vilanterol (BREO ELLIPTA ) 200-25 MCG/ACT AEPB Inhale 1 puff into the lungs daily. 90 each 3   furosemide  (LASIX ) 20 MG tablet Take 1 tablet (20 mg total) by mouth daily as needed. 90 tablet 3   Glucose Blood (BLOOD GLUCOSE TEST STRIPS 333) STRP 1 strip by In Vitro route 4  (four) times daily. 100 strip 0   insulin  glargine (LANTUS  SOLOSTAR) 100 UNIT/ML Solostar Pen Inject 34 Units into the skin daily. And 31G, 5mm pen needles 1/day 30 mL 11   Insulin  Pen Needle 32G X 4 MM MISC Inject 1 Needle into the skin every morning. 100 each 1   levothyroxine  (SYNTHROID ) 75 MCG tablet Take 1 tablet (75 mcg total) by mouth daily. 90 tablet 3   meclizine  (ANTIVERT ) 25 MG tablet Take 1 tablet (25 mg total) by mouth 3 (three) times daily as needed for dizziness. 30 tablet 0   metoprolol  succinate (TOPROL  XL) 25 MG 24 hr tablet Take 0.5 tablets (12.5 mg total) by mouth daily. 45 tablet 3   Multiple Vitamin (MULTIVITAMIN) capsule Take 1 capsule by mouth daily.     pravastatin  (PRAVACHOL ) 20 MG tablet Take 1 tablet (20 mg total) by mouth daily. 90 tablet 3   tirzepatide  (MOUNJARO ) 2.5 MG/0.5ML Pen Inject 2.5 mg into the skin once a week. 6 mL 0   No current facility-administered medications for this visit.    SURGICAL HISTORY:  Past Surgical History:  Procedure Laterality Date   BIOPSY  12/09/2022   Procedure: BIOPSY;  Surgeon: Leigh Elspeth SQUIBB, MD;  Location: [facility];  Service: Gastroenterology;;   BRONCHIAL BIOPSY  04/05/2023   Procedure: BRONCHIAL BIOPSIES;  Surgeon: Kassie Acquanetta Bradley, MD;  Location: [facility];  Service: Cardiopulmonary;;   BRONCHIAL BRUSHINGS  04/05/2023   Procedure: BRONCHIAL BRUSHINGS;  Surgeon: Kassie Acquanetta Bradley, MD;  Location: [facility];  Service: Cardiopulmonary;;   BRONCHIAL WASHINGS  04/05/2023   Procedure: BRONCHIAL WASHINGS;  Surgeon: Kassie Acquanetta Bradley, MD;  Location: [facility];  Service: Cardiopulmonary;;   CATARACT EXTRACTION Right    CHOLECYSTECTOMY     COLONOSCOPY  10/24/2009   normal rectum/1X1cm abnormal lesion in the ascending colon (bx benign). TI normal for 10cm.  Prep difficult/inadequate. f/u TCS 09/2012 recommended   COLONOSCOPY  10/13/2004   Normal rectum/Diminutive polyps, splenic flexure, cold biopsied/removed.   Remainder of colonic mucosa appeared normal.   COLONOSCOPY N/A 12/14/2012   MFM:Rnonwpr polyp-tubular adenoma   COLONOSCOPY WITH PROPOFOL  N/A 12/09/2022   Procedure: COLONOSCOPY WITH PROPOFOL ;  Surgeon: Leigh Elspeth SQUIBB, MD;  Location: [facility];  Service: Gastroenterology;  Laterality: N/A;   ESOPHAGOGASTRODUODENOSCOPY  10/13/2004    Normal esophagus/ Nodular volcano like lesion in the antrum, either representing a  pancreatic rest or leiomyoma, biopsied.  Remainder of the gastric mucosa appeared normal, normal D1-D2   ESOPHAGOGASTRODUODENOSCOPY  10/24/2009   Benign biopsies. normal esophagus/small hiatal hernia/nodular lesion antrum/distal greater curvature. duodenal AVM s/p ablation   GIVENS CAPSULE STUDY  07/27/2010    multiple arteriovenous malformations which could definitely be the contributor to her drifting hemoglobin and hematocrit   HEMOSTASIS CONTROL  04/05/2023   Procedure: HEMOSTASIS CONTROL;  Surgeon: Kassie Acquanetta Bradley, MD;  Location: [facility];  Service: Cardiopulmonary;;   HOT HEMOSTASIS N/A 12/09/2022   Procedure: HOT HEMOSTASIS (ARGON PLASMA COAGULATION/BICAP);  Surgeon: Leigh Elspeth SQUIBB, MD;  Location: [facility];  Service: Gastroenterology;  Laterality: N/A;   IR PERC PLEURAL DRAIN W/INDWELL CATH W/IMG GUIDE  01/03/2024   IR THORACENTESIS ASP PLEURAL SPACE W/IMG GUIDE  12/16/2020   POLYPECTOMY  12/09/2022   Procedure: POLYPECTOMY;  Surgeon: Leigh Elspeth SQUIBB, MD;  Location: [facility];  Service: Gastroenterology;;   THORACENTESIS  04/05/2023   Procedure: THORACENTESIS;  Surgeon: Kassie Acquanetta Bradley, MD;  Location: [facility];  Service: Cardiopulmonary;;   THORACENTESIS Right 06/08/2023   Procedure: MILANA;  Surgeon: Claudene Toribio BROCKS, MD;  Location: [facility];  Service: Pulmonary;  Laterality: Right;   THORACENTESIS Right 08/29/2023   Procedure: THORACENTESIS;  Surgeon: Meade Verdon RAMAN, MD;  Location: [facility];  Service: Pulmonary;  Laterality:  Right;   TUBAL LIGATION     US  ECHOCARDIOGRAPHY     VIDEO BRONCHOSCOPY N/A 04/05/2023   Procedure: VIDEO BRONCHOSCOPY WITHOUT FLUORO;  Surgeon: Kassie Acquanetta Bradley, MD;  Location: [facility];  Service: Cardiopulmonary;  Laterality: N/A;   VIDEO BRONCHOSCOPY WITH ENDOBRONCHIAL ULTRASOUND N/A 04/27/2018   Procedure: VIDEO BRONCHOSCOPY WITH ENDOBRONCHIAL ULTRASOUND;  Surgeon: Kerrin Elspeth BROCKS, MD;  Location: [facility];  Service: Thoracic;  Laterality: N/A;    REVIEW OF SYSTEMS:  A comprehensive review of systems was negative except for: Respiratory: positive for dyspnea on exertion   PHYSICAL EXAMINATION: General appearance: alert, cooperative, fatigued, and no distress Head: Normocephalic, without obvious abnormality, atraumatic Neck: no adenopathy, no JVD, supple, symmetrical, trachea midline, and thyroid  not enlarged, symmetric, no tenderness/mass/nodules Lymph nodes: Cervical, supraclavicular, and axillary nodes normal. Resp: diminished breath sounds RLL and dullness to percussion RLL Back: symmetric, no curvature. ROM normal. No CVA tenderness. Cardio: regular rate and rhythm, S1, S2 normal, no murmur, click, rub or gallop GI: soft, non-tender; bowel sounds normal; no masses,  no organomegaly Extremities: extremities normal, atraumatic, no cyanosis or edema  ECOG PERFORMANCE STATUS: 1 - Symptomatic but completely ambulatory  Blood pressure (!) 163/61, pulse 91, temperature (!) 97.5 F (36.4 C), temperature source Temporal, resp. rate 16, height 5' 5 (1.651 m), weight 198 lb (89.8 kg), SpO2 98%.  LABORATORY DATA: Lab Results  Component Value Date   WBC 4.5 05/01/2024   HGB 9.8 (L) 05/01/2024   HCT 32.4 (L) 05/01/2024   MCV 85.9 05/01/2024   PLT 220 05/01/2024      Chemistry      Component Value Date/Time   NA 139 05/01/2024 1204   K 4.9 05/01/2024 1204   CL 109 05/01/2024 1204   CO2 25 05/01/2024 1204   BUN 29 (H) 05/01/2024 1204   BUN 24 (A) 02/12/2020 0000    CREATININE 1.44 (H) 05/01/2024 1204   CREATININE 1.49 (H) 12/07/2023 0811      Component Value Date/Time   CALCIUM  9.0 05/01/2024 1204   ALKPHOS 112 05/01/2024 1204   ALKPHOS 88 12/10/2011 0919   AST 32 05/01/2024 1204   ALT 22 05/01/2024 1204   BILITOT 0.3 05/01/2024 1204       RADIOGRAPHIC STUDIES: CT Chest Wo Contrast Result Date: 05/07/2024 CLINICAL DATA:  Non-small cell lung cancer, staging EXAM: CT CHEST WITHOUT CONTRAST TECHNIQUE: Multidetector CT imaging of the chest was performed following the standard protocol without IV contrast. RADIATION DOSE REDUCTION: This exam was performed according to the departmental dose-optimization program which includes automated exposure control, adjustment of the mA and/or kV according to patient size and/or use of iterative reconstruction technique. COMPARISON:  Chest CT November 07, 2023 multiple priors dated back to 2021. FINDINGS: Cardiovascular: The heart  size is normal. Atherosclerotic calcifications of coronary arteries. No pericardial effusion. Mediastinum/Nodes: Subcentimeter mediastinal lymph nodes stable to prior. Necrotic lymph node measuring 1.9 cm in right upper paratracheal, previously measured 1.6 cm. (2/27). Lungs/Pleura: Right-sided moderate hydropneumothorax, decreased to prior. Right pleural drain tip in posteromedial left lower pleural cavity. Endobronchial nodularity along the bronchus intermedius is decreased in size and postobstructive atelectasis is partially improved (6/93). Interlobular septal thickening and ground-glass attenuation likely posttreatment changes. Interval improvement of right lung aeration with residual subsegmental atelectasis of central upper lobe and middle and lower lobes with air bronchogram. A 5 mm nodule in left upper lobe is stable to prior, however gradually enlarging to 2021 (8/66). Remainder of the scattered pulmonary nodules up to 6 mm are stable to prior for example left upper lobe (8/66), left lower lobe  (8/94). Again seen multifocal interlobular septal thickening, ground-glass attenuation and mild fibrotic changes with associated bronchiectasis and bronchiolectasis. No honeycombing or new consolidation.  No effusion on the left. Upper Abdomen: Cholecystectomy.  Small splenule. Musculoskeletal: Mild degenerative changes of the spine. IMPRESSION: Right-sided moderate hydropneumothorax improved to prior. Endobronchial bronchogenic malignancy of bronchus intermedius and the postobstructive atelectasis of right lung, improved to prior. Scattered pulmonary nodules, grossly stable to prior. A nodule in left upper lobe is slightly enlarged to 2021 and stable to prior. Follow-up according to oncologic protocols. Interval enlargement of right upper paratracheal necrotic lymph node. Electronically Signed   By: Megan  Zare M.D.   On: 05/07/2024 12:13     ASSESSMENT AND PLAN: This is a very pleasant 78 years old African-American [name] recently diagnosed with a stage IIIB non-small cell lung cancer, squamous cell carcinoma.  She completed a course of concurrent chemoradiation with weekly carboplatin  and paclitaxel  status post 5 cycles with partial response.  She tolerated this treatment well except for the pancytopenia and fatigue.  The patient completed a course of treatment with consolidation immunotherapy with Imfinzi  status post 26 cycles.  She tolerated her treatment well with no concerning adverse effects.   The patient is currently on observation for several years. She had repeat CT scan of the chest performed recently.  I personally and independently reviewed the scan and discussed the result with the patient today.  Her scan showed improvement of the right sided moderate hydropneumothorax but there was a slightly enlarging nodule in the left upper lobe and necrotic lymph node in the right upper paratracheal. Assessment and Plan Assessment & Plan Stage 3B non-small cell lung cancer, post-treatment, under  surveillance Stage 3B non-small cell lung cancer, initially diagnosed in August 2019, treated with concurrent chemoradiation and consolidation immunotherapy with durvalumab , completed in November 2020. Currently under surveillance with no evidence of disease progression on recent CT scan. Notable finding of necrotic lymph node in the right upper paratracheal region, increased in size from 1.6 cm to 1.9 cm, but necrotic indicating central cell death. No new growths observed. - Continue surveillance with repeat CT scan in 3 months to monitor lymph node size and ensure no further growth. - Encourage participation in lung cancer awareness events for support and community engagement.  Right-sided hydropneumothorax with pleural drain in place Right-sided hydropneumothorax with pleural drain in place, showing decreased fluid on recent CT scan. The drain is currently being managed with weekly drainage. The presence of the tube is causing some discomfort and limiting physical activity, but overall breathing has improved since fluid drainage. - Continue weekly drainage of pleural fluid as needed. - Plan for removal of pleural drain  once fluid accumulation resolves. - Re-evaluate in 3 months to assess condition post-drain removal. The patient was advised to call immediately if she has any concerning symptoms in the interval. The patient voices understanding of current disease status and treatment options and is in agreement with the current care plan. All questions were answered. The patient knows to call the clinic with any problems, questions or concerns. We can certainly see the patient much sooner if necessary. The total time spent in the appointment was 20 minutes.  Disclaimer: This note was dictated with voice recognition software. Similar sounding words can inadvertently be transcribed and may not be corrected upon review.

## 2024-05-11 ENCOUNTER — Telehealth: Payer: Self-pay | Admitting: Internal Medicine

## 2024-05-11 NOTE — Telephone Encounter (Signed)
 Scheduled appointments with the patient and she will be mailed a reminder per her request.

## 2024-06-19 ENCOUNTER — Other Ambulatory Visit: Payer: Self-pay | Admitting: Internal Medicine

## 2024-06-19 ENCOUNTER — Telehealth: Payer: Self-pay | Admitting: Internal Medicine

## 2024-06-19 DIAGNOSIS — Z1231 Encounter for screening mammogram for malignant neoplasm of breast: Secondary | ICD-10-CM

## 2024-06-19 NOTE — Telephone Encounter (Signed)
 Copied from CRM [id number]. Topic: Clinical - Medication Refill >> Jun 19, 2024 11:03 AM Jasmin G wrote: Medication: Insulin  Pen Needle 32G X 4 MM MISC. Pt states that she received the 5 inch and needs 4 inch needles.  Has the patient contacted their pharmacy? Yes (Agent: If no, request that the patient contact the pharmacy for the refill. If patient does not wish to contact the pharmacy document the reason why and proceed with request.) (Agent: If yes, when and what did the pharmacy advise?)  This is the patient's preferred pharmacy:  [id number] - [place] - [address] [place] ([place]) [place] [address] Phone: [phone] Fax: [phone]  Is this the correct pharmacy for this prescription? Yes If no, delete pharmacy and type the correct one.   Has the prescription been filled recently? Yes  Is the patient out of the medication? Yes  Has the patient been seen for an appointment in the last year OR does the patient have an upcoming appointment? Yes  Can we respond through MyChart? No  Agent: Please be advised that Rx refills may take up to 3 business days. We ask that you follow-up with your pharmacy.

## 2024-06-19 NOTE — Telephone Encounter (Signed)
 Copied from CRM [id number]. Topic: Clinical - Medication Refill >> Jun 19, 2024 11:03 AM Jasmin G wrote: Medication: Insulin  Pen Needle 32G X 4 MM MISC. Pt states that she received the 5 inch and needs 4 inch needles.   Has the patient contacted their pharmacy? Yes (Agent: If no, request that the patient contact the pharmacy for the refill. If patient does not wish to contact the pharmacy document the reason why and proceed with request.) (Agent: If yes, when and what did the pharmacy advise?)   This is the patient's preferred pharmacy:  [id number] - [place] ([place]), [place] - [address] [place] ([place]) [place] [address] Phone: [phone] Fax: [phone]

## 2024-06-20 NOTE — Telephone Encounter (Unsigned)
 Copied from CRM [id number]. Topic: Clinical - Prescription Issue >> Jun 20, 2024  2:36 PM Deaijah H wrote: Reason for CRM: Patient called in stating she needed 4 needle and not 5. Would like to have correct ones sent in.

## 2024-06-21 ENCOUNTER — Encounter: Payer: Self-pay | Admitting: "Endocrinology

## 2024-06-21 ENCOUNTER — Telehealth: Payer: Self-pay

## 2024-06-21 ENCOUNTER — Ambulatory Visit (INDEPENDENT_AMBULATORY_CARE_PROVIDER_SITE_OTHER): Admitting: "Endocrinology

## 2024-06-21 VITALS — BP 114/80 | HR 70 | Ht 65.0 in | Wt 201.0 lb

## 2024-06-21 DIAGNOSIS — E7800 Pure hypercholesterolemia, unspecified: Secondary | ICD-10-CM | POA: Diagnosis not present

## 2024-06-21 DIAGNOSIS — Z794 Long term (current) use of insulin: Secondary | ICD-10-CM

## 2024-06-21 DIAGNOSIS — E1122 Type 2 diabetes mellitus with diabetic chronic kidney disease: Secondary | ICD-10-CM | POA: Diagnosis not present

## 2024-06-21 DIAGNOSIS — Z7984 Long term (current) use of oral hypoglycemic drugs: Secondary | ICD-10-CM

## 2024-06-21 LAB — POCT GLYCOSYLATED HEMOGLOBIN (HGB A1C): Hemoglobin A1C: 6.4 % — AB (ref 4.0–5.6)

## 2024-06-21 MED ORDER — PEN NEEDLES 32G X 4 MM MISC: 1.0000 | Freq: Four times a day (QID) | 6 refills | Status: AC

## 2024-06-21 NOTE — Progress Notes (Signed)
 Outpatient Endocrinology Note Jenna Birmingham, MD  06/21/24   Jenna [name] [id number]  Referring Provider: Rollene [name] LABOR, * Primary Care Provider: Rollene [name] LABOR, MD Reason for consultation: Subjective   Assessment & Plan  Diagnoses and all orders for this visit:  Type 2 diabetes mellitus with chronic kidney disease, with long-term current use of insulin , unspecified CKD stage (HCC) -     POCT glycosylated hemoglobin (Hb A1C)  Long term (current) use of oral hypoglycemic drugs  Long-term insulin  use (HCC)  Pure hypercholesterolemia  Other orders -     Insulin  Pen Needle (PEN NEEDLES) 32G X 4 MM MISC; 1 Device by Does not apply route in the morning, at noon, in the evening, and at bedtime.   Diabetes complicated by neuropathy  Hba1c goal less than 7.0, current Hba1c is Lab Results  Component Value Date   HGBA1C 6.5 (A) 04/09/2024   HGBA1C 6.6 (H) 12/07/2023   HGBA1C 6.1 (A) 09/12/2023    Will recommend the following: Strongly encouraged to check and share blood sugars for accurate adjustments of medications Lantus  30 units qam-skip if BG <100 Farxiga  10 mg every day 04/09/24: Start mounajro 2.5 mg/week (asked pt's oncologist Jenna [name] Jenna [name] and nephrology Jenna [name] to clear) Encouraged patient to check BG at home, gave log, staff helped putting her G7 on Discussed important of checking BG, skipping lantus  if BG <100   No history of MEN syndrome/medullary thyroid  cancer/pancreatitis or pancreatic cancer in self or family No known contraindications to any of above medications  Hyperlipidemia -Last LDL at goal: 62 -on pravastatin  20 mg QD -Follow low fat diet and exercise   -Blood pressure goal <140/90 - Microalbumin/creatinine off goal at 623 - not on ACE/ARB - taken off of Losartan  50 mg qd due to hyperkalemia requiring hospitalization  -diet changes including salt restriction -limit eating outside -counseled BP  targets per standards of diabetes care -Uncontrolled blood pressure can lead to retinopathy, nephropathy and cardiovascular and atherosclerotic heart disease  Reviewed and counseled on: -A1C target -Blood sugar targets -Complications of uncontrolled diabetes  -Checking blood sugar before meals and bedtime and bring log next visit -All medications with mechanism of action and side effects -Hypoglycemia management: rule of 15's, Glucagon Emergency Kit and medical alert ID -low-carb low-fat plate-method diet -At least 20 minutes of physical activity per day -Annual dilated retinal eye exam and foot exam -compliance and follow up needs -follow up as scheduled or earlier if problem gets worse  Call if blood sugar is less than 70 or consistently above 250    Take a 15 gm snack of carbohydrate at bedtime before you go to sleep if your blood sugar is less than 100.    If you are going to fast after midnight for a test or procedure, ask your physician for instructions on how to reduce/decrease your insulin  dose.    Call if blood sugar is less than 70 or consistently above 250  -Treating a low sugar by rule of 15  (15 gms of sugar every 15 min until sugar is more than 70) If you feel your sugar is low, test your sugar to be sure If your sugar is low (less than 70), then take 15 grams of a fast acting Carbohydrate (3-4 glucose tablets or glucose gel or 4 ounces of juice or regular soda) Recheck your sugar 15 min after treating low to make sure it is more than 70 If sugar is still less than 70,  treat again with 15 grams of carbohydrate          Don't drive the hour of hypoglycemia  If unconscious/unable to eat or drink by mouth, use glucagon injection or nasal spray baqsimi and call 911. Can repeat again in 15 min if still unconscious.  Return in about 3 months (around 09/21/2024).   I have reviewed current medications, nurse's notes, allergies, vital signs, past medical and surgical history,  family medical history, and social history for this encounter. Counseled patient on symptoms, examination findings, lab findings, imaging results, treatment decisions and monitoring and prognosis. The patient understood the recommendations and agrees with the treatment plan. All questions regarding treatment plan were fully answered.  Jenna Birmingham, MD  06/21/24   History of Present Illness Jenna [name] is a 78 y.o. year old female who presents for follow up for Type 2 diabetes mellitus.  Jenna [name] was first diagnosed in 2000.   Diabetes education +  Home diabetes regimen: Lantus  30 units qam Farxiga  10 mg qd  COMPLICATIONS -  MI/Stroke, + PAD -  retinopathy -  neuropathy +  nephropathy, Cr 1.5  BLOOD SUGAR DATA Not checking sugars  No CGM on Reports no BG <100  Physical Exam  BP 114/80   Pulse 70   Ht 5' 5 (1.651 m)   Wt 201 lb (91.2 kg)   SpO2 97%   BMI 33.45 kg/m    Constitutional: well developed, well nourished Head: normocephalic, atraumatic Eyes: sclera anicteric, no redness Neck: supple Lungs: normal respiratory effort Neurology: alert and oriented Skin: dry, no appreciable rashes Musculoskeletal: no appreciable defects Psychiatric: normal mood and affect  Current Medications Patient's Medications  New Prescriptions   INSULIN  PEN NEEDLE (PEN NEEDLES) 32G X 4 MM MISC    1 Device by Does not apply route in the morning, at noon, in the evening, and at bedtime.  Previous Medications   ACCU-CHEK SOFTCLIX LANCETS LANCETS    Use as instructed   ALBUTEROL  (VENTOLIN  HFA) 108 (90 BASE) MCG/ACT INHALER    Inhale 1-2 puffs into the lungs every 6 (six) hours as needed for wheezing or shortness of breath.   BD PEN NEEDLE MINI ULTRAFINE 31G X 5 MM MISC    USE ONCE DAILY   BLOOD GLUCOSE METER KIT AND SUPPLIES    Dispense based on patient and insurance preference. Use up to four times daily as directed. (FOR ICD-10 E10.9, E11.9).   BLOOD GLUCOSE  METER KIT AND SUPPLIES KIT    Use up to four times daily as directed/accu check guide kit   CONTINUOUS GLUCOSE SENSOR (DEXCOM G7 SENSOR) MISC    1 Device by Does not apply route continuous.   DIPHENHYDRAMINE  (BENADRYL ) 2 % CREAM    Apply 1 application topically as needed for itching.   FARXIGA  10 MG TABS TABLET    Take 1 tablet (10 mg total) by mouth daily before breakfast. Follow-up appt due in Nov must see provider for future refills   FERROUS SULFATE  (IRON ) 325 (65 FE) MG TABS    Take 1 tablet (325 mg total) by mouth 2 (two) times daily with a meal.   FLUTICASONE  FUROATE-VILANTEROL (BREO ELLIPTA ) 200-25 MCG/ACT AEPB    Inhale 1 puff into the lungs daily.   FUROSEMIDE  (LASIX ) 20 MG TABLET    Take 1 tablet (20 mg total) by mouth daily as needed.   GLUCOSE BLOOD (BLOOD GLUCOSE TEST STRIPS 333) STRP    1 strip by In Vitro route  4 (four) times daily.   INSULIN  GLARGINE (LANTUS  SOLOSTAR) 100 UNIT/ML SOLOSTAR PEN    Inject 34 Units into the skin daily. And 31G, 5mm pen needles 1/day   INSULIN  PEN NEEDLE 32G X 4 MM MISC    Inject 1 Needle into the skin every morning.   LEVOTHYROXINE  (SYNTHROID ) 75 MCG TABLET    Take 1 tablet (75 mcg total) by mouth daily.   MECLIZINE  (ANTIVERT ) 25 MG TABLET    Take 1 tablet (25 mg total) by mouth 3 (three) times daily as needed for dizziness.   METOPROLOL  SUCCINATE (TOPROL  XL) 25 MG 24 HR TABLET    Take 0.5 tablets (12.5 mg total) by mouth daily.   MULTIPLE VITAMIN (MULTIVITAMIN) CAPSULE    Take 1 capsule by mouth daily.   PRAVASTATIN  (PRAVACHOL ) 20 MG TABLET    Take 1 tablet (20 mg total) by mouth daily.   TIRZEPATIDE  (MOUNJARO ) 2.5 MG/0.5ML PEN    Inject 2.5 mg into the skin once a week.  Modified Medications   No medications on file  Discontinued Medications   No medications on file    Allergies Allergies  Allergen Reactions   Paclitaxel  Other (See Comments)    Unresponsiveness shortly after Taxol  inf started 06/12/18.   Aspirin Other (See Comments)     Stomach bleeding    Esomeprazole Magnesium     UNSPECIFIED REACTION    Losartan      AKI, hyperkalemia   Ace Inhibitors Other (See Comments)    Dizziness, drunk like    Past Medical History Past Medical History:  Diagnosis Date   Blood transfusion without reported diagnosis    Cataract    DM (diabetes mellitus) (HCC)    Dyspnea    GERD (gastroesophageal reflux disease)    Hyperlipidemia    Hypertension    Iron  deficiency anemia    NSCL ca dx'd 03/2018   Pneumonia    Thyroid  disease     Past Surgical History Past Surgical History:  Procedure Laterality Date   BIOPSY  12/09/2022   Procedure: BIOPSY;  Surgeon: Leigh Elspeth SQUIBB, MD;  Location: [facility];  Service: Gastroenterology;;   BRONCHIAL BIOPSY  04/05/2023   Procedure: BRONCHIAL BIOPSIES;  Surgeon: Kassie Acquanetta Bradley, MD;  Location: [facility];  Service: Cardiopulmonary;;   BRONCHIAL BRUSHINGS  04/05/2023   Procedure: BRONCHIAL BRUSHINGS;  Surgeon: Kassie Acquanetta Bradley, MD;  Location: [facility];  Service: Cardiopulmonary;;   BRONCHIAL WASHINGS  04/05/2023   Procedure: BRONCHIAL WASHINGS;  Surgeon: Kassie Acquanetta Bradley, MD;  Location: [facility];  Service: Cardiopulmonary;;   CATARACT EXTRACTION Right    CHOLECYSTECTOMY     COLONOSCOPY  10/24/2009   normal rectum/1X1cm abnormal lesion in the ascending colon (bx benign). TI normal for 10cm.  Prep difficult/inadequate. f/u TCS 09/2012 recommended   COLONOSCOPY  10/13/2004   Normal rectum/Diminutive polyps, splenic flexure, cold biopsied/removed.  Remainder of colonic mucosa appeared normal.   COLONOSCOPY N/A 12/14/2012   MFM:Rnonwpr polyp-tubular adenoma   COLONOSCOPY WITH PROPOFOL  N/A 12/09/2022   Procedure: COLONOSCOPY WITH PROPOFOL ;  Surgeon: Leigh Elspeth SQUIBB, MD;  Location: [facility];  Service: Gastroenterology;  Laterality: N/A;   ESOPHAGOGASTRODUODENOSCOPY  10/13/2004    Normal esophagus/ Nodular volcano like lesion in the antrum, either representing a   pancreatic rest or leiomyoma, biopsied.  Remainder of the gastric mucosa appeared normal, normal D1-D2   ESOPHAGOGASTRODUODENOSCOPY  10/24/2009   Benign biopsies. normal esophagus/small hiatal hernia/nodular lesion antrum/distal greater curvature. duodenal AVM s/p ablation   GIVENS CAPSULE STUDY  07/27/2010    multiple arteriovenous malformations which could definitely be the contributor to her drifting hemoglobin and hematocrit   HEMOSTASIS CONTROL  04/05/2023   Procedure: HEMOSTASIS CONTROL;  Surgeon: Kassie Acquanetta Bradley, MD;  Location: [facility];  Service: Cardiopulmonary;;   HOT HEMOSTASIS N/A 12/09/2022   Procedure: HOT HEMOSTASIS (ARGON PLASMA COAGULATION/BICAP);  Surgeon: Leigh Elspeth SQUIBB, MD;  Location: [facility];  Service: Gastroenterology;  Laterality: N/A;   IR PERC PLEURAL DRAIN W/INDWELL CATH W/IMG GUIDE  01/03/2024   IR THORACENTESIS ASP PLEURAL SPACE W/IMG GUIDE  12/16/2020   POLYPECTOMY  12/09/2022   Procedure: POLYPECTOMY;  Surgeon: Leigh Elspeth SQUIBB, MD;  Location: [facility];  Service: Gastroenterology;;   THORACENTESIS  04/05/2023   Procedure: THORACENTESIS;  Surgeon: Kassie Acquanetta Bradley, MD;  Location: [facility];  Service: Cardiopulmonary;;   THORACENTESIS Right 06/08/2023   Procedure: MILANA;  Surgeon: Claudene Toribio BROCKS, MD;  [facility];  Service: Pulmonary;  Laterality: Right;   THORACENTESIS Right 08/29/2023   Procedure: THORACENTESIS;  Surgeon: Meade Verdon RAMAN, MD;  Location: [facility]COPY;  Service: Pulmonary;  Laterality: Right;   TUBAL LIGATION     US  ECHOCARDIOGRAPHY     VIDEO BRONCHOSCOPY N/A 04/05/2023   Procedure: VIDEO BRONCHOSCOPY WITHOUT FLUORO;  Surgeon: Kassie Acquanetta Bradley, MD;  Location: [facility];  Service: Cardiopulmonary;  Laterality: N/A;   VIDEO BRONCHOSCOPY WITH ENDOBRONCHIAL ULTRASOUND N/A 04/27/2018   Procedure: VIDEO BRONCHOSCOPY WITH ENDOBRONCHIAL ULTRASOUND;  Surgeon: Kerrin Elspeth BROCKS, MD;  Location: [facility] OR;   Service: Thoracic;  Laterality: N/A;    Family History family history includes Breast cancer in her cousin; Colon cancer in her maternal aunt; Diabetes in her daughter, daughter, and sister.  Social History Social History   Socioeconomic History   Marital status: Single    Spouse name: Not on file   Number of children: 3   Years of education: Not on file   Highest education level: Not on file  Occupational History   Occupation: retired  Tobacco Use   Smoking status: Former    Current packs/day: 0.00    Average packs/day: 0.5 packs/day for 55.0 years (27.5 ttl pk-yrs)    Types: Cigarettes    Start date: 03/19/1963    Quit date: 03/18/2018    Years since quitting: 6.2   Smokeless tobacco: Never   Tobacco comments:    smoked off and n  Vaping Use   Vaping status: Never Used  Substance and Sexual Activity   Alcohol  use: No   Drug use: No   Sexual activity: Not Currently  Other Topics Concern   Not on file  Social History Narrative   Patient fully vaccinated      Lives alone/2025   Social Drivers of Health   Financial Resource Strain: Low Risk  (01/17/2024)   Overall Financial Resource Strain (CARDIA)    Difficulty of Paying Living Expenses: Not hard at all  Food Insecurity: No Food Insecurity (01/17/2024)   Hunger Vital Sign    Worried About Running Out of Food in the Last Year: Never true    Ran Out of Food in the Last Year: Never true  Transportation Needs: No Transportation Needs (01/17/2024)   PRAPARE - Administrator, Civil Service (Medical): No    Lack of Transportation (Non-Medical): No  Physical Activity: Inactive (01/17/2024)   Exercise Vital Sign    Days of Exercise per Week: 0 days    Minutes of Exercise per Session: 0 min  Stress: No Stress  Concern Present (01/17/2024)   [employer] of Occupational Health - Occupational Stress Questionnaire    Feeling of Stress : Not at all  Social Connections: Socially Isolated (01/17/2024)   Social Connection  and Isolation Panel    Frequency of Communication with Friends and Family: More than three times a week    Frequency of Social Gatherings with Friends and Family: More than three times a week    Attends Religious Services: Never    Database Administrator or Organizations: No    Attends Banker Meetings: Never    Marital Status: Never married  Intimate Partner Violence: Patient Unable To Answer (01/17/2024)   Humiliation, Afraid, Rape, and Kick questionnaire    Fear of Current or Ex-Partner: Patient unable to answer    Emotionally Abused: Patient unable to answer    Physically Abused: Patient unable to answer    Sexually Abused: Patient unable to answer    Lab Results  Component Value Date   HGBA1C 6.5 (A) 04/09/2024   HGBA1C 6.6 (H) 12/07/2023   HGBA1C 6.1 (A) 09/12/2023   Lab Results  Component Value Date   CHOL 135 12/07/2023   Lab Results  Component Value Date   HDL 44 (L) 12/07/2023   Lab Results  Component Value Date   LDLCALC 77 12/07/2023   Lab Results  Component Value Date   TRIG 65 12/07/2023   Lab Results  Component Value Date   CHOLHDL 3.1 12/07/2023   Lab Results  Component Value Date   CREATININE 1.44 (H) 05/01/2024   Lab Results  Component Value Date   GFR 32.58 (L) 02/09/2024   Lab Results  Component Value Date   MICROALBUR 62.9 12/07/2023      Component Value Date/Time   NA 139 05/01/2024 1204   K 4.9 05/01/2024 1204   CL 109 05/01/2024 1204   CO2 25 05/01/2024 1204   GLUCOSE 142 (H) 05/01/2024 1204   BUN 29 (H) 05/01/2024 1204   BUN 24 (A) 02/12/2020 0000   CREATININE 1.44 (H) 05/01/2024 1204   CREATININE 1.49 (H) 12/07/2023 0811   CALCIUM  9.0 05/01/2024 1204   PROT 8.2 (H) 05/01/2024 1204   ALBUMIN 3.5 05/01/2024 1204   ALBUMIN 4.3 12/10/2011 0919   AST 32 05/01/2024 1204   ALT 22 05/01/2024 1204   ALKPHOS 112 05/01/2024 1204   ALKPHOS 88 12/10/2011 0919   BILITOT 0.3 05/01/2024 1204   GFRNONAA 37 (L) 05/01/2024  1204   GFRAA 43 (L) 02/12/2020 0941      Latest Ref Rng & Units 05/01/2024   12:04 PM 02/09/2024   10:25 AM 12/07/2023    8:11 AM  BMP  Glucose 70 - 99 mg/dL 857  865  65   BUN 8 - 23 mg/dL 29  28  32   Creatinine 0.44 - 1.00 mg/dL 8.55  8.46  8.50   BUN/Creat Ratio 6 - 22 (calc)   21   Sodium 135 - 145 mmol/L 139  138  139   Potassium 3.5 - 5.1 mmol/L 4.9  4.5  4.9   Chloride 98 - 111 mmol/L 109  105  109   CO2 22 - 32 mmol/L 25  26  23    Calcium  8.9 - 10.3 mg/dL 9.0  9.2  9.1        Component Value Date/Time   WBC 4.5 05/01/2024 1204   WBC 6.0 02/09/2024 1025   RBC 3.77 (L) 05/01/2024 1204   HGB 9.8 (L) 05/01/2024  1204   HGB 8.1 (L) 12/27/2017 1123   HCT 32.4 (L) 05/01/2024 1204   HCT 24.4 (L) 12/27/2017 1123   PLT 220 05/01/2024 1204   PLT 296 12/27/2017 1123   MCV 85.9 05/01/2024 1204   MCV 70 (L) 12/27/2017 1123   MCH 26.0 05/01/2024 1204   MCHC 30.2 05/01/2024 1204   RDW 16.5 (H) 05/01/2024 1204   RDW 17.0 (H) 12/27/2017 1123   LYMPHSABS 0.3 (L) 05/01/2024 1204   LYMPHSABS 0.8 12/27/2017 1123   MONOABS 0.3 05/01/2024 1204   EOSABS 0.1 05/01/2024 1204   EOSABS 0.1 12/27/2017 1123   BASOSABS 0.0 05/01/2024 1204   BASOSABS 0.0 12/27/2017 1123     Parts of this note may have been dictated using voice recognition software. There may be variances in spelling and vocabulary which are unintentional. Not all errors are proofread. Please notify the dino if any discrepancies are noted or if the meaning of any statement is not clear.

## 2024-06-21 NOTE — Telephone Encounter (Signed)
 Copied from CRM #[id number]. Topic: General - Other >> Jun 21, 2024  9:29 AM Russell PARAS wrote: Reason for CRM:   Pt advises she missed a call from the clinic. When calling back, she was disconnected from the previous rep.   Reviewed chart and was unable to find reason for call- no recent lab work or imaging. Does have appt on 11/13  CB#  [phone]  I do not see any documentation either on why pt would have been called.   I called pt to make sure she was okay and didn't need anything. Pt verbalized she was fine and would keep her upcoming appt. NFN

## 2024-06-21 NOTE — Telephone Encounter (Signed)
 Spoke with [facility] there is not much difference between the 2 needle sizes however, there is a rx for the 4 needles if the patient so desires.  Spoke with patient and relayed this information per her pharmacy. Pt stated  I want what I want. Made pt aware that she does have a rx for the 4 needles and she can pick them up at the pharmacy. Pt asked what she could do with the 5 needles and I recommended that she return them to the pharmacy of she does not want to use them. Pt verbalized understanding.

## 2024-06-21 NOTE — Patient Instructions (Signed)

## 2024-06-26 ENCOUNTER — Ambulatory Visit: Admitting: Internal Medicine

## 2024-06-28 ENCOUNTER — Other Ambulatory Visit (HOSPITAL_COMMUNITY): Payer: Self-pay | Admitting: Internal Medicine

## 2024-06-28 ENCOUNTER — Ambulatory Visit (INDEPENDENT_AMBULATORY_CARE_PROVIDER_SITE_OTHER): Admitting: Internal Medicine

## 2024-06-28 ENCOUNTER — Encounter: Payer: Self-pay | Admitting: Internal Medicine

## 2024-06-28 VITALS — BP 149/77 | HR 94 | Temp 98.2°F | Ht 65.0 in | Wt 200.2 lb

## 2024-06-28 DIAGNOSIS — R0602 Shortness of breath: Secondary | ICD-10-CM

## 2024-06-28 DIAGNOSIS — Z85118 Personal history of other malignant neoplasm of bronchus and lung: Secondary | ICD-10-CM

## 2024-06-28 DIAGNOSIS — J90 Pleural effusion, not elsewhere classified: Secondary | ICD-10-CM

## 2024-06-28 NOTE — Patient Instructions (Signed)
 It was a pleasure to see you today!  Please schedule follow up with Dr Zaida in 2 months. Please call sooner [phone] if issues or concerns arise. You can also send us  a message through MyChart, but but aware that this is not to be used for urgent issues and it may take up to 5-7 days to receive a reply. Please be aware that you will likely be able to view your results before I have a chance to respond to them. Please give us  5 business days to respond to any non-urgent results.    I will place the order for your pleurx catheter removal since there is no further drainage.   Continue Breo inhaler 1 puff once daily  Continue albuterol  inhaler as needed.

## 2024-06-28 NOTE — Progress Notes (Signed)
 Jenna [name]    [id number]    07-05-1946  Primary Care Physician:Crawford, Almarie LABOR, MD Date of Appointment: 06/28/2024 Established Patient Visit  Chief complaint:   Chief Complaint  Patient presents with   Shortness of Breath    Follow up      HPI: Jenna [name] is a 78 y.o. woman with h/o tobacco use disorder and mild emphysema. 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   Interval Updates: Here for pleurx follow up. No further drainage from the pleurx. Wants to have it taken out.   Having some itching around the tunneling site. Some discomfort.  Still on breo 1 puff daily and albuterol  prn.   Overall improvement since pleurx catheter placed due to dyspnea from recurrent effusion. However does have dyspnea on exertion.   I have reviewed the patient's family social and past medical history and updated as appropriate.   Past Medical History:  Diagnosis Date   Blood transfusion without reported diagnosis    Cataract    DM (diabetes mellitus) (HCC)    Dyspnea    GERD (gastroesophageal reflux disease)    Hyperlipidemia    Hypertension    Iron  deficiency anemia    NSCL ca dx'd 03/2018   Pneumonia    Thyroid  disease     Past Surgical History:  Procedure Laterality Date   BIOPSY  12/09/2022   Procedure: BIOPSY;  Surgeon: Leigh Elspeth SQUIBB, MD;  Location: [facility];  Service: Gastroenterology;;   BRONCHIAL BIOPSY  04/05/2023   Procedure: BRONCHIAL BIOPSIES;  Surgeon: Kassie Acquanetta Bradley, MD;  Location: [facility];  Service: Cardiopulmonary;;   BRONCHIAL BRUSHINGS  04/05/2023   Procedure: BRONCHIAL BRUSHINGS;  Surgeon: Kassie Acquanetta Bradley, MD;  Location: [facility];  Service: Cardiopulmonary;;   BRONCHIAL WASHINGS  04/05/2023   Procedure: BRONCHIAL WASHINGS;  Surgeon: Kassie Acquanetta Bradley, MD;  Location: [facility];   Service: Cardiopulmonary;;   CATARACT EXTRACTION Right    CHOLECYSTECTOMY     COLONOSCOPY  10/24/2009   normal rectum/1X1cm abnormal lesion in the ascending colon (bx benign). TI normal for 10cm.  Prep difficult/inadequate. f/u TCS 09/2012 recommended   COLONOSCOPY  10/13/2004   Normal rectum/Diminutive polyps, splenic flexure, cold biopsied/removed.  Remainder of colonic mucosa appeared normal.   COLONOSCOPY N/A 12/14/2012   MFM:Rnonwpr polyp-tubular adenoma   COLONOSCOPY WITH PROPOFOL  N/A 12/09/2022   Procedure: COLONOSCOPY WITH PROPOFOL ;  Surgeon: Leigh Elspeth SQUIBB, MD;  Location: [facility];  Service: Gastroenterology;  Laterality: N/A;   ESOPHAGOGASTRODUODENOSCOPY  10/13/2004    Normal esophagus/ Nodular volcano like lesion in the antrum, either representing a  pancreatic rest or leiomyoma, biopsied.  Remainder of the gastric mucosa appeared normal, normal D1-D2   ESOPHAGOGASTRODUODENOSCOPY  10/24/2009   Benign biopsies. normal esophagus/small hiatal hernia/nodular lesion antrum/distal greater curvature. duodenal AVM s/p ablation   GIVENS CAPSULE STUDY  07/27/2010    multiple arteriovenous malformations which could definitely be the contributor to her drifting hemoglobin and hematocrit   HEMOSTASIS CONTROL  04/05/2023   Procedure: HEMOSTASIS CONTROL;  Surgeon: Kassie Acquanetta Bradley, MD;  Location: [facility];  Service: Cardiopulmonary;;   HOT HEMOSTASIS N/A 12/09/2022   Procedure: HOT HEMOSTASIS (ARGON PLASMA COAGULATION/BICAP);  Surgeon: Leigh Elspeth SQUIBB, MD;  Location: [facility];  Service: Gastroenterology;  Laterality: N/A;   IR PERC PLEURAL DRAIN W/INDWELL CATH W/IMG GUIDE  01/03/2024  IR THORACENTESIS ASP PLEURAL SPACE W/IMG GUIDE  12/16/2020   POLYPECTOMY  12/09/2022   Procedure: POLYPECTOMY;  Surgeon: Leigh Elspeth SQUIBB, MD;  Location: [facility];  Service: Gastroenterology;;   THORACENTESIS  04/05/2023   Procedure: THORACENTESIS;  Surgeon: Kassie Acquanetta Bradley, MD;   Location: [facility];  Service: Cardiopulmonary;;   THORACENTESIS Right 06/08/2023   Procedure: MILANA;  Surgeon: Claudene Toribio BROCKS, MD;  Location: [facility];  Service: Pulmonary;  Laterality: Right;   THORACENTESIS Right 08/29/2023   Procedure: THORACENTESIS;  Surgeon: Meade Verdon RAMAN, MD;  Location: [facility];  Service: Pulmonary;  Laterality: Right;   TUBAL LIGATION     US  ECHOCARDIOGRAPHY     VIDEO BRONCHOSCOPY N/A 04/05/2023   Procedure: VIDEO BRONCHOSCOPY WITHOUT FLUORO;  Surgeon: Kassie Acquanetta Bradley, MD;  Location: [facility];  Service: Cardiopulmonary;  Laterality: N/A;   VIDEO BRONCHOSCOPY WITH ENDOBRONCHIAL ULTRASOUND N/A 04/27/2018   Procedure: VIDEO BRONCHOSCOPY WITH ENDOBRONCHIAL ULTRASOUND;  Surgeon: Kerrin Elspeth BROCKS, MD;  Location: [facility];  Service: Thoracic;  Laterality: N/A;    Family History  Problem Relation Age of Onset   Diabetes Sister    Colon cancer Maternal Aunt        greater than age 60   Breast cancer Cousin    Diabetes Daughter    Diabetes Daughter    Amblyopia Neg Hx    Blindness Neg Hx    Cataracts Neg Hx    Glaucoma Neg Hx    Macular degeneration Neg Hx    Retinal detachment Neg Hx    Strabismus Neg Hx    Retinitis pigmentosa Neg Hx    Rectal cancer Neg Hx    Stomach cancer Neg Hx    Colon polyps Neg Hx    Esophageal cancer Neg Hx     Social History   Occupational History   Occupation: retired  Tobacco Use   Smoking status: Former    Current packs/day: 0.00    Average packs/day: 0.5 packs/day for 55.0 years (27.5 ttl pk-yrs)    Types: Cigarettes    Start date: 03/19/1963    Quit date: 03/18/2018    Years since quitting: 6.2   Smokeless tobacco: Never   Tobacco comments:    smoked off and n  Vaping Use   Vaping status: Never Used  Substance and Sexual Activity   Alcohol  use: No   Drug use: No   Sexual activity: Not Currently     Physical Exam: Blood pressure (!) 149/77, pulse 94, temperature 98.2 F (36.8 C),  temperature source Oral, height 5' 5 (1.651 m), weight 200 lb 3.2 oz (90.8 kg), SpO2 95%.  Gen:      No distress well appearing Lungs: improved aeration right lung base. Plerux in appropriate position CV:         RRR no mrg   Data Reviewed: Imaging: Personally reviewed CT Chest 04/2024 - improved right hydropneumothorax with improved aeration in right lung base. Improved post obstructive atelectasis. Right paratracheal lymph node is necrotic and enlarged.   PFTs:     Latest Ref Rng & Units 03/15/2022   10:43 AM 05/11/2018    2:33 PM  PFT Results  FVC-Pre L 1.23    FVC-Predicted Pre % 41  71   FVC-Post L 1.13  1.77   FVC-Predicted Post % 38  73   Pre FEV1/FVC % % 80  83   Post FEV1/FCV % % 80  82   FEV1-Pre L 0.98  1.44   FEV1-Predicted Pre %  44  76   FEV1-Post L 0.91  1.46   DLCO uncorrected ml/min/mmHg 7.37  10.35   DLCO UNC% % 37  40   DLCO corrected ml/min/mmHg 7.37  12.65   DLCO COR %Predicted % 37  49   DLVA Predicted % 64  79   TLC L 3.62  4.30   TLC % Predicted % 69  82   RV % Predicted % 88  104    I have personally reviewed the patient's PFTs and show severe restriction to ventilation with reduced diffusion capacity.   Labs: Lab Results  Component Value Date   NA 139 05/01/2024   K 4.9 05/01/2024   CO2 25 05/01/2024   GLUCOSE 142 (H) 05/01/2024   BUN 29 (H) 05/01/2024   CREATININE 1.44 (H) 05/01/2024   CALCIUM  9.0 05/01/2024   GFR 32.58 (L) 02/09/2024   EGFR 36 (L) 12/07/2023   GFRNONAA 37 (L) 05/01/2024   Lab Results  Component Value Date   WBC 4.5 05/01/2024   HGB 9.8 (L) 05/01/2024   HCT 32.4 (L) 05/01/2024   MCV 85.9 05/01/2024   PLT 220 05/01/2024    Immunization status: Immunization History  Administered Date(s) Administered   Fluad Quad(high Dose 65+) 05/31/2019, 09/12/2020   Influenza-Unspecified 04/16/2014   PFIZER(Purple Top)SARS-COV-2 Vaccination 11/22/2019, 12/17/2019   Pneumococcal Conjugate-13 06/10/2014   Pneumococcal  Polysaccharide-23 04/19/2013    External Records Personally Reviewed: oncology, primary care.   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 Now worsened in the setting of right lung collapse Recurrent Right sided pleural effusion Emphysema, mild History of tobacco use, quit 2019 Chronic cough, likely reflux  Plan/Recommendations: Likely multifactorial dyspnea from chronic RLL collapse secondary to post - radiation changes, recurrent pleural effusion. Also deconditioning, asthma/copd.   I will place the order for your pleurx catheter removal since there is no further drainage.   Continue Breo inhaler 1 puff once daily  Continue albuterol  inhaler as needed.   Return to Care: Return in about 2 months (around 08/28/2024).   [name], MD Pulmonary and Critical Care Medicine [facility] Office:[phone]

## 2024-06-29 ENCOUNTER — Ambulatory Visit (HOSPITAL_COMMUNITY)
Admission: RE | Admit: 2024-06-29 | Discharge: 2024-06-29 | Disposition: A | Discharge status: Home / Self Care | Source: Ambulatory Visit | Attending: Internal Medicine | Admitting: Internal Medicine

## 2024-06-29 DIAGNOSIS — J90 Pleural effusion, not elsewhere classified: Secondary | ICD-10-CM

## 2024-06-29 HISTORY — PX: IR REMOVAL OF PLURAL CATH W/CUFF: IMG5346

## 2024-06-29 NOTE — Procedures (Signed)
 Interventional Radiology Procedure Note  Procedure: REMOVAL RT CHEST TUNNELED PLEURAL DRAIN    Complications: None  Estimated Blood Loss:  0  Findings: FULL REPORT IN PACS     EMERSON FREDERIC SPECKING, MD

## 2024-07-04 ENCOUNTER — Ambulatory Visit (INDEPENDENT_AMBULATORY_CARE_PROVIDER_SITE_OTHER)

## 2024-07-04 ENCOUNTER — Ambulatory Visit: Admitting: Internal Medicine

## 2024-07-04 ENCOUNTER — Encounter: Payer: Self-pay | Admitting: Internal Medicine

## 2024-07-04 VITALS — BP 136/60 | HR 93 | Temp 97.8°F | Ht 65.0 in | Wt 199.0 lb

## 2024-07-04 DIAGNOSIS — G4489 Other headache syndrome: Secondary | ICD-10-CM | POA: Diagnosis not present

## 2024-07-04 DIAGNOSIS — N1832 Chronic kidney disease, stage 3b: Secondary | ICD-10-CM | POA: Diagnosis not present

## 2024-07-04 DIAGNOSIS — J90 Pleural effusion, not elsewhere classified: Secondary | ICD-10-CM

## 2024-07-04 DIAGNOSIS — R052 Subacute cough: Secondary | ICD-10-CM

## 2024-07-04 DIAGNOSIS — E1122 Type 2 diabetes mellitus with diabetic chronic kidney disease: Secondary | ICD-10-CM

## 2024-07-04 MED ORDER — PROMETHAZINE-DM 6.25-15 MG/5ML PO SYRP: 5.0000 mL | ORAL_SOLUTION | Freq: Four times a day (QID) | ORAL | 0 refills | Status: AC | PRN

## 2024-07-04 NOTE — Progress Notes (Signed)
 Subjective:   Patient ID: Jenna [name], female    DOB: 1946-01-04, 78 y.o.   MRN: [id number]  Discussed the use of AI scribe software for clinical note transcription with the patient, who gave verbal consent to proceed.  History of Present Illness Jenna [name] is a 78 year old female with a history of pneumonia who presents with symptoms of a respiratory infection.  She feels unwell and has a headache. She has a persistent cough with yellowish mucus and is concerned about the possibility of having the flu or pneumonia. No sinus drainage or pressure is noted, and the headache is localized to her head. No fevers or chills are present, and she is unaware of any sick contacts.  She recently had a chest catheter removed last week. Her breathing has not worsened since the removal. The site of the catheter is no longer draining and is covered with a Band-Aid, which is not being soaked through. She mentions that the removal process involved significant bleeding, but the drainage has since slowed down.  She uses inhalers as instructed, taking one puff in the morning, but is unsure of their effectiveness. She uses the albuterol  inhaler when she hears wheezing, which provides temporary relief. No chest pain, tightness, heart racing, or pressure is reported. She reports feeling swelling near her rib cage, not in her stomach. No diarrhea, constipation, or heartburn is present.  She has not taken any medication for her headache but considers taking Tylenol , which she has used in the past for headaches.   Review of Systems  Constitutional: Negative.   HENT: Negative.    Eyes: Negative.   Respiratory:  Positive for cough and shortness of breath. Negative for chest tightness.   Cardiovascular:  Negative for chest pain, palpitations and leg swelling.  Gastrointestinal:  Negative for abdominal distention, abdominal pain, constipation, diarrhea, nausea and vomiting.  Musculoskeletal:  Negative.   Skin: Negative.   Neurological:  Positive for headaches.  Psychiatric/Behavioral: Negative.      Objective:  Physical Exam Constitutional:      Appearance: She is well-developed.  HENT:     Head: Normocephalic and atraumatic.  Cardiovascular:     Rate and Rhythm: Normal rate and regular rhythm.  Pulmonary:     Effort: Pulmonary effort is normal. No respiratory distress.     Breath sounds: Rales present. No wheezing.  Abdominal:     General: Bowel sounds are normal. There is no distension.     Palpations: Abdomen is soft.     Tenderness: There is no abdominal tenderness.  Musculoskeletal:     Cervical back: Normal range of motion.  Skin:    General: Skin is warm and dry.  Neurological:     Mental Status: She is alert and oriented to person, place, and time.     Coordination: Coordination normal.     Vitals:   07/04/24 1036 07/04/24 1044  BP: (!) 148/60 136/60  Pulse: 93   Temp: 97.8 F (36.6 C)   TempSrc: Oral   SpO2: 94%   Weight: 199 lb (90.3 kg)   Height: 5' 5 (1.651 m)     Assessment and Plan Assessment & Plan Subacute cough with sputum production   Cough with yellowish sputum is likely due to pleural effusion and recent catheter removal. Differential includes pneumonia without fever or chills. Cough may contribute to headache. Ordered chest x-ray to evaluate fluid accumulation and rule out pneumonia. Prescribed cough medicine for symptom relief and improved sleep. Advised  albuterol  inhaler as needed for wheezing.  Pleural effusion, right lower lobe, post-catheter removal   Persistent pleural effusion in the right lower lobe post-catheter removal with possible fluid accumulation contributing to cough and sputum. Ordered chest x-ray to assess fluid status in the right lower lobe.  Acute headache   Headache likely secondary to increased coughing and pressure from pleural effusion. Recommended Tylenol  for headache relief.

## 2024-07-04 NOTE — Patient Instructions (Signed)
 We have sent in cough medicine for you to use up to 3 times a day as needed.   We will do the chest x-ray to check for pneumonia.

## 2024-07-06 DIAGNOSIS — N1832 Chronic kidney disease, stage 3b: Secondary | ICD-10-CM | POA: Insufficient documentation

## 2024-07-06 NOTE — Assessment & Plan Note (Signed)
 Cough with yellowish sputum is likely due to pleural effusion and recent catheter removal. Differential includes pneumonia without fever or chills. Cough may contribute to headache. Ordered chest x-ray to evaluate fluid accumulation and rule out pneumonia. Prescribed cough medicine for symptom relief and improved sleep. Advised albuterol  inhaler as needed for wheezing.

## 2024-07-06 NOTE — Assessment & Plan Note (Signed)
 Headache likely secondary to increased coughing and pressure from pleural effusion. Recommended Tylenol  for headache relief.

## 2024-07-06 NOTE — Assessment & Plan Note (Signed)
 Stable GFR between 30-37 >4 months. Has DM and HTN and stable.

## 2024-07-09 ENCOUNTER — Ambulatory Visit: Payer: Self-pay | Admitting: Internal Medicine

## 2024-07-18 ENCOUNTER — Ambulatory Visit
Admission: RE | Admit: 2024-07-18 | Discharge: 2024-07-18 | Disposition: A | Source: Ambulatory Visit | Attending: Internal Medicine | Admitting: Internal Medicine

## 2024-07-18 DIAGNOSIS — Z1231 Encounter for screening mammogram for malignant neoplasm of breast: Secondary | ICD-10-CM

## 2024-07-27 ENCOUNTER — Ambulatory Visit: Payer: Self-pay | Admitting: Internal Medicine

## 2024-07-30 ENCOUNTER — Inpatient Hospital Stay: Attending: Internal Medicine

## 2024-07-30 ENCOUNTER — Ambulatory Visit (HOSPITAL_COMMUNITY): Admission: RE | Admit: 2024-07-30 | Discharge: 2024-07-30 | Attending: Internal Medicine | Admitting: Internal Medicine

## 2024-07-30 DIAGNOSIS — Z9221 Personal history of antineoplastic chemotherapy: Secondary | ICD-10-CM | POA: Insufficient documentation

## 2024-07-30 DIAGNOSIS — C3490 Malignant neoplasm of unspecified part of unspecified bronchus or lung: Secondary | ICD-10-CM

## 2024-07-30 DIAGNOSIS — Z923 Personal history of irradiation: Secondary | ICD-10-CM | POA: Diagnosis not present

## 2024-07-30 DIAGNOSIS — Z85118 Personal history of other malignant neoplasm of bronchus and lung: Secondary | ICD-10-CM | POA: Insufficient documentation

## 2024-07-30 LAB — CBC WITH DIFFERENTIAL (CANCER CENTER ONLY)
Abs Immature Granulocytes: 0.02 K/uL (ref 0.00–0.07)
Basophils Absolute: 0 K/uL (ref 0.0–0.1)
Basophils Relative: 1 %
Eosinophils Absolute: 0.1 K/uL (ref 0.0–0.5)
Eosinophils Relative: 2 %
HCT: 32.8 % — ABNORMAL LOW (ref 36.0–46.0)
Hemoglobin: 9.7 g/dL — ABNORMAL LOW (ref 12.0–15.0)
Immature Granulocytes: 1 %
Lymphocytes Relative: 8 %
Lymphs Abs: 0.3 K/uL — ABNORMAL LOW (ref 0.7–4.0)
MCH: 24.4 pg — ABNORMAL LOW (ref 26.0–34.0)
MCHC: 29.6 g/dL — ABNORMAL LOW (ref 30.0–36.0)
MCV: 82.6 fL (ref 80.0–100.0)
Monocytes Absolute: 0.3 K/uL (ref 0.1–1.0)
Monocytes Relative: 6 %
Neutro Abs: 3.6 K/uL (ref 1.7–7.7)
Neutrophils Relative %: 82 %
Platelet Count: 228 K/uL (ref 150–400)
RBC: 3.97 MIL/uL (ref 3.87–5.11)
RDW: 18.2 % — ABNORMAL HIGH (ref 11.5–15.5)
WBC Count: 4.3 K/uL (ref 4.0–10.5)
nRBC: 0 % (ref 0.0–0.2)

## 2024-07-30 LAB — CMP (CANCER CENTER ONLY)
ALT: 17 U/L (ref 0–44)
AST: 35 U/L (ref 15–41)
Albumin: 3.7 g/dL (ref 3.5–5.0)
Alkaline Phosphatase: 115 U/L (ref 38–126)
Anion gap: 10 (ref 5–15)
BUN: 33 mg/dL — ABNORMAL HIGH (ref 8–23)
CO2: 23 mmol/L (ref 22–32)
Calcium: 9 mg/dL (ref 8.9–10.3)
Chloride: 108 mmol/L (ref 98–111)
Creatinine: 1.52 mg/dL — ABNORMAL HIGH (ref 0.44–1.00)
GFR, Estimated: 35 mL/min — ABNORMAL LOW (ref >60)
Glucose, Bld: 120 mg/dL — ABNORMAL HIGH (ref 70–99)
Potassium: 4.8 mmol/L (ref 3.5–5.1)
Sodium: 141 mmol/L (ref 135–145)
Total Bilirubin: 0.4 mg/dL (ref 0.0–1.2)
Total Protein: 8.9 g/dL — ABNORMAL HIGH (ref 6.5–8.1)

## 2024-08-02 NOTE — Progress Notes (Signed)
Pt is aware of mammogram results.  

## 2024-08-06 ENCOUNTER — Inpatient Hospital Stay: Admitting: Internal Medicine

## 2024-08-06 VITALS — BP 157/50 | HR 84 | Temp 98.1°F | Resp 17 | Ht 65.0 in | Wt 196.0 lb

## 2024-08-06 DIAGNOSIS — C3490 Malignant neoplasm of unspecified part of unspecified bronchus or lung: Secondary | ICD-10-CM

## 2024-08-06 NOTE — Progress Notes (Signed)
 "     [facility] Telephone:([phone]) [phone]   Fax:([phone]) [phone]  OFFICE PROGRESS NOTE  [name] LABOR, MD [address] [place] [address]  DIAGNOSIS: Stage IIIB (T3, N3, M0) non-small cell lung cancer, squamous cell carcinoma presented with large right middle lobe lung breast in addition to mediastinal and bilateral hilar lymphadenopathy and suspicious right supraclavicular lymph node diagnosed in August 2019.    PRIOR THERAPY:  1) A course of concurrent chemoradiation with weekly carboplatin  for AUC of 2 and paclitaxel  45 MG/M2.  First dose of chemotherapy given on 05/29/2018.  Status post 5 cycles. 2) Consolidation treatment with immunotherapy with Imfinzi  10 mg/KG every 2 weeks.  First dose August 10, 2018.  Status post 26 cycles.   CURRENT THERAPY:  Observation.  INTERVAL HISTORY: Jenna [name] 78 y.o. female returns to the clinic today for follow-up visit.Discussed the use of AI scribe software for clinical note transcription with the patient, who gave verbal consent to proceed.  History of Present Illness Jenna [name] is a 78 year old female with stage 3B non-small cell lung cancer, status post durvalumab  therapy, who presents for routine post-treatment surveillance and repeat chest CT for disease restaging.  She completed durvalumab  therapy in November 2020 and has been under observation since. She previously required a right-sided chest tube for pleural effusion, which was removed without recurrence of pleural fluid.  She denies new or worsening respiratory symptoms. She describes mild exertional fatigue, attributing this to decreased activity. She denies shortness of breath at rest, chest pain, or other concerning symptoms.  She is transitioning pulmonary care due to her pulmonologist's departure, with a new pulmonology appointment scheduled. No additional new symptoms or changes in overall health were reported.    MEDICAL  HISTORY: Past Medical History:  Diagnosis Date   Blood transfusion without reported diagnosis    Cataract    DM (diabetes mellitus) (HCC)    Dyspnea    GERD (gastroesophageal reflux disease)    Hyperlipidemia    Hypertension    Iron  deficiency anemia    NSCL ca dx'd 03/2018   Pneumonia    Thyroid  disease     ALLERGIES:  is allergic to paclitaxel , aspirin, esomeprazole magnesium, losartan , and ace inhibitors.  MEDICATIONS:  Current Outpatient Medications  Medication Sig Dispense Refill   Accu-Chek Softclix Lancets lancets Use as instructed 100 each 12   albuterol  (VENTOLIN  HFA) 108 (90 Base) MCG/ACT inhaler Inhale 1-2 puffs into the lungs every 6 (six) hours as needed for wheezing or shortness of breath. 6.7 g 2   BD PEN NEEDLE MINI ULTRAFINE 31G X 5 MM MISC USE ONCE DAILY 100 each 0   blood glucose meter kit and supplies KIT Use up to four times daily as directed/accu check guide kit 1 each 0   blood glucose meter kit and supplies Dispense based on patient and insurance preference. Use up to four times daily as directed. (FOR ICD-10 E10.9, E11.9). 1 each 0   Continuous Glucose Sensor (DEXCOM G7 SENSOR) MISC 1 Device by Does not apply route continuous. 9 each 0   diphenhydrAMINE  (BENADRYL ) 2 % cream Apply 1 application topically as needed for itching.     FARXIGA  10 MG TABS tablet Take 1 tablet (10 mg total) by mouth daily before breakfast. Follow-up appt due in Nov must see provider for future refills 90 tablet 3   Ferrous Sulfate  (IRON ) 325 (65 Fe) MG TABS Take 1 tablet (325 mg total) by mouth 2 (two)  times daily with a meal. 180 tablet 0   fluticasone  furoate-vilanterol (BREO ELLIPTA ) 200-25 MCG/ACT AEPB Inhale 1 puff into the lungs daily. 90 each 3   furosemide  (LASIX ) 20 MG tablet Take 1 tablet (20 mg total) by mouth daily as needed. 90 tablet 3   Glucose Blood (BLOOD GLUCOSE TEST STRIPS 333) STRP 1 strip by In Vitro route 4 (four) times daily. 100 strip 0   insulin  glargine  (LANTUS  SOLOSTAR) 100 UNIT/ML Solostar Pen Inject 34 Units into the skin daily. And 31G, 5mm pen needles 1/day (Patient taking differently: Inject 30 Units into the skin daily. And 31G, 5mm pen needles 1/day) 30 mL 11   Insulin  Pen Needle (PEN NEEDLES) 32G X 4 MM MISC 1 Device by Does not apply route in the morning, at noon, in the evening, and at bedtime. 200 each 6   Insulin  Pen Needle 32G X 4 MM MISC Inject 1 Needle into the skin every morning. 100 each 1   levothyroxine  (SYNTHROID ) 75 MCG tablet Take 1 tablet (75 mcg total) by mouth daily. 90 tablet 3   meclizine  (ANTIVERT ) 25 MG tablet Take 1 tablet (25 mg total) by mouth 3 (three) times daily as needed for dizziness. 30 tablet 0   metoprolol  succinate (TOPROL  XL) 25 MG 24 hr tablet Take 0.5 tablets (12.5 mg total) by mouth daily. 45 tablet 3   Multiple Vitamin (MULTIVITAMIN) capsule Take 1 capsule by mouth daily.     pravastatin  (PRAVACHOL ) 20 MG tablet Take 1 tablet (20 mg total) by mouth daily. 90 tablet 3   promethazine -dextromethorphan (PROMETHAZINE -DM) 6.25-15 MG/5ML syrup Take 5 mLs by mouth 4 (four) times daily as needed. 118 mL 0   tirzepatide  (MOUNJARO ) 2.5 MG/0.5ML Pen Inject 2.5 mg into the skin once a week. 6 mL 0   No current facility-administered medications for this visit.    SURGICAL HISTORY:  Past Surgical History:  Procedure Laterality Date   BIOPSY  12/09/2022   Procedure: BIOPSY;  Surgeon: Leigh Elspeth SQUIBB, MD;  Location: [facility];  Service: Gastroenterology;;   BRONCHIAL BIOPSY  04/05/2023   Procedure: BRONCHIAL BIOPSIES;  Surgeon: Kassie Acquanetta Bradley, MD;  Location: [facility];  Service: Cardiopulmonary;;   BRONCHIAL BRUSHINGS  04/05/2023   Procedure: BRONCHIAL BRUSHINGS;  Surgeon: Kassie Acquanetta Bradley, MD;  Location: [facility];  Service: Cardiopulmonary;;   BRONCHIAL WASHINGS  04/05/2023   Procedure: BRONCHIAL WASHINGS;  Surgeon: Kassie Acquanetta Bradley, MD;  Location: [facility];  Service: Cardiopulmonary;;    CATARACT EXTRACTION Right    CHOLECYSTECTOMY     COLONOSCOPY  10/24/2009   normal rectum/1X1cm abnormal lesion in the ascending colon (bx benign). TI normal for 10cm.  Prep difficult/inadequate. f/u TCS 09/2012 recommended   COLONOSCOPY  10/13/2004   Normal rectum/Diminutive polyps, splenic flexure, cold biopsied/removed.  Remainder of colonic mucosa appeared normal.   COLONOSCOPY N/A 12/14/2012   MFM:Rnonwpr polyp-tubular adenoma   COLONOSCOPY WITH PROPOFOL  N/A 12/09/2022   Procedure: COLONOSCOPY WITH PROPOFOL ;  Surgeon: Leigh Elspeth SQUIBB, MD;  Location: [facility];  Service: Gastroenterology;  Laterality: N/A;   ESOPHAGOGASTRODUODENOSCOPY  10/13/2004    Normal esophagus/ Nodular volcano like lesion in the antrum, either representing a  pancreatic rest or leiomyoma, biopsied.  Remainder of the gastric mucosa appeared normal, normal D1-D2   ESOPHAGOGASTRODUODENOSCOPY  10/24/2009   Benign biopsies. normal esophagus/small hiatal hernia/nodular lesion antrum/distal greater curvature. duodenal AVM s/p ablation   GIVENS CAPSULE STUDY  07/27/2010    multiple arteriovenous malformations which could definitely be the  contributor to her drifting hemoglobin and hematocrit   HEMOSTASIS CONTROL  04/05/2023   Procedure: HEMOSTASIS CONTROL;  Surgeon: Kassie Acquanetta Bradley, MD;  Location: [facility];  Service: Cardiopulmonary;;   HOT HEMOSTASIS N/A 12/09/2022   Procedure: HOT HEMOSTASIS (ARGON PLASMA COAGULATION/BICAP);  Surgeon: Leigh Elspeth SQUIBB, MD;  Location: [facility];  Service: Gastroenterology;  Laterality: N/A;   IR PERC PLEURAL DRAIN W/INDWELL CATH W/IMG GUIDE  01/03/2024   IR REMOVAL OF PLURAL CATH W/CUFF  06/29/2024   IR THORACENTESIS ASP PLEURAL SPACE W/IMG GUIDE  12/16/2020   POLYPECTOMY  12/09/2022   Procedure: POLYPECTOMY;  Surgeon: Leigh Elspeth SQUIBB, MD;  Location: [facility];  Service: Gastroenterology;;   THORACENTESIS  04/05/2023   Procedure: THORACENTESIS;  Surgeon: Kassie Acquanetta Bradley, MD;  Location: [facility];  Service: Cardiopulmonary;;   THORACENTESIS Right 06/08/2023   Procedure: MILANA;  Surgeon: Claudene Toribio BROCKS, MD;  Location: [facility];  Service: Pulmonary;  Laterality: Right;   THORACENTESIS Right 08/29/2023   Procedure: THORACENTESIS;  Surgeon: Meade Verdon RAMAN, MD;  Location: [facility];  Service: Pulmonary;  Laterality: Right;   TUBAL LIGATION     US  ECHOCARDIOGRAPHY     VIDEO BRONCHOSCOPY N/A 04/05/2023   Procedure: VIDEO BRONCHOSCOPY WITHOUT FLUORO;  Surgeon: Kassie Acquanetta Bradley, MD;  Location: [facility];  Service: Cardiopulmonary;  Laterality: N/A;   VIDEO BRONCHOSCOPY WITH ENDOBRONCHIAL ULTRASOUND N/A 04/27/2018   Procedure: VIDEO BRONCHOSCOPY WITH ENDOBRONCHIAL ULTRASOUND;  Surgeon: Kerrin Elspeth BROCKS, MD;  Location: [facility];  Service: Thoracic;  Laterality: N/A;    REVIEW OF SYSTEMS:  A comprehensive review of systems was negative except for: Constitutional: positive for fatigue Respiratory: positive for dyspnea on exertion   PHYSICAL EXAMINATION: General appearance: alert, cooperative, fatigued, and no distress Head: Normocephalic, without obvious abnormality, atraumatic Neck: no adenopathy, no JVD, supple, symmetrical, trachea midline, and thyroid  not enlarged, symmetric, no tenderness/mass/nodules Lymph nodes: Cervical, supraclavicular, and axillary nodes normal. Resp: diminished breath sounds RLL and dullness to percussion RLL Back: symmetric, no curvature. ROM normal. No CVA tenderness. Cardio: regular rate and rhythm, S1, S2 normal, no murmur, click, rub or gallop GI: soft, non-tender; bowel sounds normal; no masses,  no organomegaly Extremities: extremities normal, atraumatic, no cyanosis or edema  ECOG PERFORMANCE STATUS: 1 - Symptomatic but completely ambulatory  Blood pressure (!) 157/50, pulse 84, temperature 98.1 F (36.7 C), temperature source Temporal, resp. rate 17, height 5' 5 (1.651 m), weight 196 lb (88.9 kg),  SpO2 96%.  LABORATORY DATA: Lab Results  Component Value Date   WBC 4.3 07/30/2024   HGB 9.7 (L) 07/30/2024   HCT 32.8 (L) 07/30/2024   MCV 82.6 07/30/2024   PLT 228 07/30/2024      Chemistry      Component Value Date/Time   NA 141 07/30/2024 1210   K 4.8 07/30/2024 1210   CL 108 07/30/2024 1210   CO2 23 07/30/2024 1210   BUN 33 (H) 07/30/2024 1210   BUN 24 (A) 02/12/2020 0000   CREATININE 1.52 (H) 07/30/2024 1210   CREATININE 1.49 (H) 12/07/2023 0811      Component Value Date/Time   CALCIUM  9.0 07/30/2024 1210   ALKPHOS 115 07/30/2024 1210   ALKPHOS 88 12/10/2011 0919   AST 35 07/30/2024 1210   ALT 17 07/30/2024 1210   BILITOT 0.4 07/30/2024 1210       RADIOGRAPHIC STUDIES: CT Chest Wo Contrast Result Date: 08/04/2024 CLINICAL DATA:  Non-small cell lung cancer.  * Tracking Code: BO * EXAM: CT  CHEST WITHOUT CONTRAST TECHNIQUE: Multidetector CT imaging of the chest was performed following the standard protocol without IV contrast. RADIATION DOSE REDUCTION: This exam was performed according to the departmental dose-optimization program which includes automated exposure control, adjustment of the mA and/or kV according to patient size and/or use of iterative reconstruction technique. COMPARISON:  05/01/2024. FINDINGS: Cardiovascular: Atherosclerotic calcification of the aorta, aortic valve and coronary arteries. Heart is enlarged. No pericardial effusion. Mediastinum/Nodes: Thoracic inlet lymph nodes are not enlarged by CT size criteria. High right paratracheal lymph nodes measure up to 1.5 cm in short axis, stable. No new mediastinal adenopathy. Hilar regions are difficult to definitively evaluate without IV contrast. No axillary adenopathy. There may be mid and lower esophageal wall thickening, likely treatment related, unchanged. Lungs/Pleura: Post radiation parenchymal retraction, bronchiectasis and collapse/consolidation minimally within the medial right upper lobe and to a  larger extent within the right middle and right lower lobes, as on 05/01/2024. Centrilobular and paraseptal emphysema. Small to moderate loculated right pleural effusion and associated pleural thickening, stable. Mild scarring in the lingula and left lower lobe. Left upper left lower lobe nodules measure up to 4 mm in the left upper lobe (6/63), unchanged. Debris in the airway. Upper Abdomen: Liver margin is slightly irregular. Cholecystectomy. Visualized portions of the liver, adrenal glands, kidneys, spleen, pancreas, stomach and bowel are otherwise grossly unremarkable. No upper abdominal adenopathy. Musculoskeletal: Degenerative changes in the spine. No worrisome lytic or sclerotic lesions. Old sternal fracture. IMPRESSION: 1. Extensive post treatment changes in the right hemithorax with stable right paratracheal adenopathy. No evidence of disease progression. 2. Small to moderate chronic right pleural effusion. 3. Liver appears cirrhotic. 4. Aortic atherosclerosis (ICD10-I70.0). Coronary artery calcification. 5.  Emphysema (ICD10-J43.9). Electronically Signed   By: Newell Eke M.D.   On: 08/04/2024 12:39   MM 3D SCREENING MAMMOGRAM BILATERAL BREAST Result Date: 07/25/2024 CLINICAL DATA:  Screening. EXAM: DIGITAL SCREENING BILATERAL MAMMOGRAM WITH TOMOSYNTHESIS AND CAD TECHNIQUE: Bilateral screening digital craniocaudal and mediolateral oblique mammograms were obtained. Bilateral screening digital breast tomosynthesis was performed. The images were evaluated with computer-aided detection. COMPARISON:  Previous exam(s). ACR Breast Density Category b: There are scattered areas of fibroglandular density. FINDINGS: There are no findings suspicious for malignancy. IMPRESSION: No mammographic evidence of malignancy. A result letter of this screening mammogram will be mailed directly to the patient. RECOMMENDATION: Screening mammogram in one year. (Code:[id number]-[id number]) BI-RADS CATEGORY  1: Negative. Electronically  Signed   By: Alm Parkins M.D.   On: 07/25/2024 15:39     ASSESSMENT AND PLAN: This is a very pleasant 78 years old African-American female recently diagnosed with a stage IIIB non-small cell lung cancer, squamous cell carcinoma.  She completed a course of concurrent chemoradiation with weekly carboplatin  and paclitaxel  status post 5 cycles with partial response.  She tolerated this treatment well except for the pancytopenia and fatigue.  The patient completed a course of treatment with consolidation immunotherapy with Imfinzi  status post 26 cycles.  She tolerated her treatment well with no concerning adverse effects.   The patient is currently on observation for several years. She had repeat CT scan of the chest performed recently.  I personally independently reviewed the scan and discussed the result with the patient today.  Her scan showed no concerning findings for disease progression. Assessment and Plan Assessment & Plan Lung cancer in remission She has no evidence of disease recurrence on recent chest CT. She experiences mild exertional fatigue without significant respiratory symptoms. No recurrence of pleural  effusion after prior chest tube removal. - Reviewed recent chest CT, which showed no evidence of recurrence. - Provided copy of current scan to her. - Scheduled follow-up in six months with repeat chest CT for surveillance. - Advised her to contact the office if new symptoms develop. She was advised to call immediately if she has any other concerning symptoms in the interval.  The patient voices understanding of current disease status and treatment options and is in agreement with the current care plan. All questions were answered. The patient knows to call the clinic with any problems, questions or concerns. We can certainly see the patient much sooner if necessary. The total time spent in the appointment was 20 minutes.  Disclaimer: This note was dictated with voice recognition  software. Similar sounding words can inadvertently be transcribed and may not be corrected upon review.       "

## 2024-08-28 ENCOUNTER — Encounter

## 2024-09-05 ENCOUNTER — Ambulatory Visit

## 2024-09-05 VITALS — BP 158/75 | HR 95 | Temp 97.1°F | Ht 65.0 in | Wt 198.4 lb

## 2024-09-05 DIAGNOSIS — Z87891 Personal history of nicotine dependence: Secondary | ICD-10-CM | POA: Diagnosis not present

## 2024-09-05 DIAGNOSIS — W888XXS Exposure to other ionizing radiation, sequela: Secondary | ICD-10-CM | POA: Diagnosis not present

## 2024-09-05 DIAGNOSIS — J90 Pleural effusion, not elsewhere classified: Secondary | ICD-10-CM

## 2024-09-05 DIAGNOSIS — J701 Chronic and other pulmonary manifestations due to radiation: Secondary | ICD-10-CM | POA: Diagnosis not present

## 2024-09-05 DIAGNOSIS — R0602 Shortness of breath: Secondary | ICD-10-CM

## 2024-09-05 DIAGNOSIS — J984 Other disorders of lung: Secondary | ICD-10-CM

## 2024-09-05 DIAGNOSIS — Z85118 Personal history of other malignant neoplasm of bronchus and lung: Secondary | ICD-10-CM | POA: Diagnosis not present

## 2024-09-05 DIAGNOSIS — J439 Emphysema, unspecified: Secondary | ICD-10-CM | POA: Diagnosis not present

## 2024-09-05 DIAGNOSIS — C3491 Malignant neoplasm of unspecified part of right bronchus or lung: Secondary | ICD-10-CM

## 2024-09-05 NOTE — Patient Instructions (Signed)
" °  VISIT SUMMARY: Jenna [name], a 79 year old female with a history of stage 3B non-small cell squamous lung cancer in remission, visited for a pulmonary follow-up. She has undergone chemotherapy and immunotherapy and continues regular oncology follow-ups. She experiences shortness of breath during ambulation and uses an albuterol  inhaler as needed and Breo Ellipta  as a maintenance inhaler. Recent CT imaging shows no evidence of disease progression but reveals post-radiation fibrotic changes, a small chronic right-sided pleural effusion, and bilateral emphysema.  YOUR PLAN: -STAGE IIIB NON-SMALL CELL LUNG CANCER IN REMISSION: You were diagnosed with stage IIIB non-small cell squamous lung cancer in 2019 and are currently in remission. Recent CT imaging shows no evidence of disease progression. Continue follow-up with oncology for monitoring and surveillance of your lung cancer remission.  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 This means that your lung tissue has become scarred and less flexible.  -CHRONIC RIGHT-SIDED PLEURAL EFFUSION: You have a small, chronic pleural effusion on the right side, which means there is a persistent collection of fluid in the space around your lungs.  -EMPHYSEMA: You have emphysema, a condition where the air sacs in your lungs are damaged, leading to shortness of breath. This is likely due to your history of smoking. We will trial discontinuing your Breo Ellipta  inhaler for one week to see if it affects your breathing. If your breathing worsens or you experience increased wheezing, resume using the Breo Ellipta  inhaler.  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 This is likely due to damage from previous cancer treatment and  fibrosis. We will continue to monitor your respiratory status and adjust treatment as needed.  INSTRUCTIONS: Continue follow-up with oncology for monitoring and surveillance of your lung cancer remission.  We will see you back in 6 months    Contains text generated by [employer].   "

## 2024-09-05 NOTE — Progress Notes (Signed)
 "   Subjective:   PATIENT ID: Jenna [name] GENDER: female DOB: 18-Feb-1946, MRN: [id number]   HPI Discussed the use of AI scribe software for clinical note transcription with the patient, who gave verbal consent to proceed.  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 She was previously under the care of Doctor Meade and is now establishing care with Dr. Zaida.  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 She remains in remission and continues regular oncology follow-ups for monitoring and surveillance.  She experienced a recurrent right-sided pleural effusion, necessitating a PleurX catheter, which was removed in November 2025. A CT scan on August 04, 2024, revealed extensive post-radiation fibrotic changes in the right lung, stable paratracheal adenopathy, a small chronic right-sided pleural effusion, and bilateral emphysema. There is no evidence of disease progression.  She experiences shortness of breath, particularly during ambulation, but not at rest. She uses an albuterol  inhaler as needed for dyspnea and Breo Ellipta  as a maintenance inhaler, taking one puff in the morning. The albuterol  inhaler is used less than three times a week.  Pulmonary function tests from 2023 showed an FVC of 41% predicted, FEV1 of 44% predicted, FEV1/FVC ratio of 80%, total lung capacity of 69% predicted, and DLCO of 37% predicted.  She has a history of smoking approximately five cigarettes daily for forty years, which she acknowledges as a contributing factor to her lung condition. No history of asthma prior to her cancer diagnosis.     Past Medical History:  Diagnosis Date   Blood transfusion without reported diagnosis    Cataract    DM (diabetes mellitus) (HCC)     Dyspnea    GERD (gastroesophageal reflux disease)    Hyperlipidemia    Hypertension    Iron  deficiency anemia    NSCL ca dx'd 03/2018   Pneumonia    Thyroid  disease      Family History  Problem Relation Age of Onset   Diabetes Sister    Colon cancer Maternal Aunt        greater than age 63   Breast cancer Cousin    Diabetes Daughter    Diabetes Daughter    Amblyopia Neg Hx    Blindness Neg Hx    Cataracts Neg Hx    Glaucoma Neg Hx    Macular degeneration Neg Hx    Retinal detachment Neg Hx    Strabismus Neg Hx    Retinitis pigmentosa Neg Hx    Rectal cancer Neg Hx    Stomach cancer Neg Hx    Colon polyps Neg Hx    Esophageal cancer Neg Hx      Social History   Socioeconomic History   Marital status: Single    Spouse name: Not on file   Number of children: 3   Years of education: Not on file   Highest education level: Not on file  Occupational History   Occupation: retired  Tobacco Use   Smoking status: Former    Current packs/day: 0.00    Average packs/day: 0.5 packs/day for 55.0 years (27.5 ttl pk-yrs)    Types: Cigarettes    Start date: 03/19/1963    Quit date: 03/18/2018    Years since quitting: 6.4   Smokeless tobacco: Never   Tobacco comments:    smoked off  and n  Vaping Use   Vaping status: Never Used  Substance and Sexual Activity   Alcohol  use: No   Drug use: No   Sexual activity: Not Currently  Other Topics Concern   Not on file  Social History Narrative   Patient fully vaccinated      Lives alone/2025   Social Drivers of Health   Tobacco Use: Medium Risk (09/05/2024)   Patient History    Smoking Tobacco Use: Former    Smokeless Tobacco Use: Never    Passive Exposure: Not on file  Financial Resource Strain: Low Risk (01/17/2024)   Overall Financial Resource Strain (CARDIA)    Difficulty of Paying Living Expenses: Not hard at all  Food Insecurity: No Food Insecurity (01/17/2024)   Hunger Vital Sign    Worried About Running Out of Food in the  Last Year: Never true    Ran Out of Food in the Last Year: Never true  Transportation Needs: No Transportation Needs (01/17/2024)   PRAPARE - Administrator, Civil Service (Medical): No    Lack of Transportation (Non-Medical): No  Physical Activity: Inactive (01/17/2024)   Exercise Vital Sign    Days of Exercise per Week: 0 days    Minutes of Exercise per Session: 0 min  Stress: No Stress Concern Present (01/17/2024)   [employer] of Occupational Health - Occupational Stress Questionnaire    Feeling of Stress : Not at all  Social Connections: Socially Isolated (01/17/2024)   Social Connection and Isolation Panel    Frequency of Communication with Friends and Family: More than three times a week    Frequency of Social Gatherings with Friends and Family: More than three times a week    Attends Religious Services: Never    Database Administrator or Organizations: No    Attends Banker Meetings: Never    Marital Status: Never married  Intimate Partner Violence: Patient Unable To Answer (01/17/2024)   Humiliation, Afraid, Rape, and Kick questionnaire    Fear of Current or Ex-Partner: Patient unable to answer    Emotionally Abused: Patient unable to answer    Physically Abused: Patient unable to answer    Sexually Abused: Patient unable to answer  Depression (PHQ2-9): Low Risk (02/09/2024)   Depression (PHQ2-9)    PHQ-2 Score: 0  Alcohol  Screen: Low Risk (01/17/2024)   Alcohol  Screen    Last Alcohol  Screening Score (AUDIT): 0  Housing: Low Risk (01/17/2024)   Housing Stability Vital Sign    Unable to Pay for Housing in the Last Year: No    Number of Times Moved in the Last Year: 0    Homeless in the Last Year: No  Utilities: Not At Risk (01/17/2024)   AHC Utilities    Threatened with loss of utilities: No  Health Literacy: Adequate Health Literacy (01/17/2024)   B1300 Health Literacy    Frequency of need for help with medical instructions: Never     Allergies[1]    Outpatient Medications Prior to Visit  Medication Sig Dispense Refill   Accu-Chek Softclix Lancets lancets Use as instructed 100 each 12   albuterol  (VENTOLIN  HFA) 108 (90 Base) MCG/ACT inhaler Inhale 1-2 puffs into the lungs every 6 (six) hours as needed for wheezing or shortness of breath. 6.7 g 2   BD PEN NEEDLE MINI ULTRAFINE 31G X 5 MM MISC USE ONCE DAILY 100 each 0   diphenhydrAMINE  (BENADRYL ) 2 % cream Apply 1 application topically as needed for  itching.     FARXIGA  10 MG TABS tablet Take 1 tablet (10 mg total) by mouth daily before breakfast. Follow-up appt due in Nov must see provider for future refills 90 tablet 3   Ferrous Sulfate  (IRON ) 325 (65 Fe) MG TABS Take 1 tablet (325 mg total) by mouth 2 (two) times daily with a meal. 180 tablet 0   fluticasone  furoate-vilanterol (BREO ELLIPTA ) 200-25 MCG/ACT AEPB Inhale 1 puff into the lungs daily. 90 each 3   furosemide  (LASIX ) 20 MG tablet Take 1 tablet (20 mg total) by mouth daily as needed. 90 tablet 3   Glucose Blood (BLOOD GLUCOSE TEST STRIPS 333) STRP 1 strip by In Vitro route 4 (four) times daily. 100 strip 0   insulin  glargine (LANTUS  SOLOSTAR) 100 UNIT/ML Solostar Pen Inject 34 Units into the skin daily. And 31G, 5mm pen needles 1/day (Patient taking differently: Inject 30 Units into the skin daily. And 31G, 5mm pen needles 1/day) 30 mL 11   Insulin  Pen Needle (PEN NEEDLES) 32G X 4 MM MISC 1 Device by Does not apply route in the morning, at noon, in the evening, and at bedtime. 200 each 6   Insulin  Pen Needle 32G X 4 MM MISC Inject 1 Needle into the skin every morning. 100 each 1   levothyroxine  (SYNTHROID ) 75 MCG tablet Take 1 tablet (75 mcg total) by mouth daily. 90 tablet 3   meclizine  (ANTIVERT ) 25 MG tablet Take 1 tablet (25 mg total) by mouth 3 (three) times daily as needed for dizziness. 30 tablet 0   metoprolol  succinate (TOPROL  XL) 25 MG 24 hr tablet Take 0.5 tablets (12.5 mg total) by mouth daily. 45 tablet 3   Multiple  Vitamin (MULTIVITAMIN) capsule Take 1 capsule by mouth daily.     pravastatin  (PRAVACHOL ) 20 MG tablet Take 1 tablet (20 mg total) by mouth daily. 90 tablet 3   promethazine -dextromethorphan (PROMETHAZINE -DM) 6.25-15 MG/5ML syrup Take 5 mLs by mouth 4 (four) times daily as needed. 118 mL 0   blood glucose meter kit and supplies KIT Use up to four times daily as directed/accu check guide kit (Patient not taking: Reported on 09/05/2024) 1 each 0   blood glucose meter kit and supplies Dispense based on patient and insurance preference. Use up to four times daily as directed. (FOR ICD-10 E10.9, E11.9). (Patient not taking: Reported on 09/05/2024) 1 each 0   Continuous Glucose Sensor (DEXCOM G7 SENSOR) MISC 1 Device by Does not apply route continuous. (Patient not taking: Reported on 09/05/2024) 9 each 0   tirzepatide  (MOUNJARO ) 2.5 MG/0.5ML Pen Inject 2.5 mg into the skin once a week. (Patient not taking: Reported on 09/05/2024) 6 mL 0   No facility-administered medications prior to visit.    ROS Reviewed all systems and reported negative except as above     Objective:   Vitals:   09/05/24 1004  BP: (!) 177/66  Pulse: 95  Temp: (!) 97.1 F (36.2 C)  SpO2: 95%  Weight: 198 lb 6.4 oz (90 kg)  Height: 5' 5 (1.651 m)    Physical Exam Physical Exam GENERAL: Appropriate to age, no acute distress. HEAD EYES EARS NOSE THROAT: Moist mucous membranes, atraumatic, normocephalic. CHEST: Clear to auscultation bilaterally, no wheezing, no crackles, right lung with good air entry despite damage. CARDIAC: Regular rate and rhythm, normal S1, normal S2, no murmurs, no rubs, no gallops. ABDOMEN: Soft, nontender. NEUROLOGICAL: Motor and sensation grossly intact, alert and oriented times X 3. EXTREMITIES: Warm, well perfused,  no edema.     CBC    Component Value Date/Time   WBC 4.3 07/30/2024 1210   WBC 6.0 02/09/2024 1025   RBC 3.97 07/30/2024 1210   HGB 9.7 (L) 07/30/2024 1210   HGB 8.1 (L)  12/27/2017 1123   HCT 32.8 (L) 07/30/2024 1210   HCT 24.4 (L) 12/27/2017 1123   PLT 228 07/30/2024 1210   PLT 296 12/27/2017 1123   MCV 82.6 07/30/2024 1210   MCV 70 (L) 12/27/2017 1123   MCH 24.4 (L) 07/30/2024 1210   MCHC 29.6 (L) 07/30/2024 1210   RDW 18.2 (H) 07/30/2024 1210   RDW 17.0 (H) 12/27/2017 1123   LYMPHSABS 0.3 (L) 07/30/2024 1210   LYMPHSABS 0.8 12/27/2017 1123   MONOABS 0.3 07/30/2024 1210   EOSABS 0.1 07/30/2024 1210   EOSABS 0.1 12/27/2017 1123   BASOSABS 0.0 07/30/2024 1210   BASOSABS 0.0 12/27/2017 1123     Results Radiology Chest CT (08/04/2024): Extensive right lung post-radiation fibrosis, stable paratracheal lymphadenopathy, no evidence of disease progression, small chronic right pleural effusion, bilateral emphysema  Diagnostic Pulmonary function tests (2023): FVC 41% predicted, FEV1 44% predicted, FEV1/FVC ratio 80%, total lung capacity 69.9% predicted, DLCO 37% predicted; findings suggestive of restrictive ventilatory defect with parenchymal destruction   PFT:    Latest Ref Rng & Units 03/15/2022   10:43 AM 05/11/2018    2:33 PM  PFT Results  FVC-Pre L 1.23    FVC-Predicted Pre % 41  71   FVC-Post L 1.13  1.77   FVC-Predicted Post % 38  73   Pre FEV1/FVC % % 80  83   Post FEV1/FCV % % 80  82   FEV1-Pre L 0.98  1.44   FEV1-Predicted Pre % 44  76   FEV1-Post L 0.91  1.46   DLCO uncorrected ml/min/mmHg 7.37  10.35   DLCO UNC% % 37  40   DLCO corrected ml/min/mmHg 7.37  12.65   DLCO COR %Predicted % 37  49   DLVA Predicted % 64  79   TLC L 3.62  4.30   TLC % Predicted % 69  82   RV % Predicted % 88  104          Assessment & Plan:   Assessment and Plan Assessment & Plan Stage IIIB non-small cell lung cancer in remission Stage IIIB non-small cell squamous lung cancer diagnosed in 2019, currently in remission. Recent CT imaging shows no evidence of disease progression, stable paratracheal adenopathy, and a small chronic right-sided  pleural effusion. - Continue follow-up with oncology for monitoring and surveillance of lung cancer remission.  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 The right lung appears smaller than the left lung, likely due to fibrosis and previous cancer treatment.  Chronic right-sided pleural effusion Small chronic right-sided pleural effusion noted on recent CT imaging.  Emphysema and possible asthma Bilateral emphysema noted on CT imaging. Smoking history of approximately 5 cigarettes per day for 40 years. 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 Likely due to parenchymal destruction from previous cancer treatment and fibrosis. - Continue to monitor respiratory status and adjust treatment as needed.        Zola Herter, MD [facility] Office: [phone]       [1]  Allergies Allergen Reactions   Paclitaxel  Other (See Comments)    Unresponsiveness shortly after Taxol  inf started 06/12/18.   Aspirin Other (See Comments)    Stomach bleeding    Esomeprazole Magnesium     UNSPECIFIED REACTION    Losartan      AKI, hyperkalemia   Ace Inhibitors Other (See Comments)    Dizziness, drunk like   "

## 2024-09-18 ENCOUNTER — Inpatient Hospital Stay (HOSPITAL_COMMUNITY)
Admission: EM | Admit: 2024-09-18 | Source: Home / Self Care | Attending: Internal Medicine | Admitting: Internal Medicine

## 2024-09-18 ENCOUNTER — Inpatient Hospital Stay (HOSPITAL_COMMUNITY)

## 2024-09-18 ENCOUNTER — Encounter (HOSPITAL_COMMUNITY): Payer: Self-pay

## 2024-09-18 ENCOUNTER — Other Ambulatory Visit: Payer: Self-pay

## 2024-09-18 ENCOUNTER — Emergency Department (HOSPITAL_COMMUNITY)

## 2024-09-18 DIAGNOSIS — N179 Acute kidney failure, unspecified: Secondary | ICD-10-CM

## 2024-09-18 DIAGNOSIS — I4891 Unspecified atrial fibrillation: Secondary | ICD-10-CM

## 2024-09-18 DIAGNOSIS — E875 Hyperkalemia: Principal | ICD-10-CM | POA: Diagnosis present

## 2024-09-18 DIAGNOSIS — C342 Malignant neoplasm of middle lobe, bronchus or lung: Secondary | ICD-10-CM

## 2024-09-18 DIAGNOSIS — R9431 Abnormal electrocardiogram [ECG] [EKG]: Secondary | ICD-10-CM

## 2024-09-18 DIAGNOSIS — I2489 Other forms of acute ischemic heart disease: Secondary | ICD-10-CM

## 2024-09-18 LAB — BASIC METABOLIC PANEL WITH GFR
Anion gap: 16 — ABNORMAL HIGH (ref 5–15)
Anion gap: 17 — ABNORMAL HIGH (ref 5–15)
Anion gap: 17 — ABNORMAL HIGH (ref 5–15)
BUN: 41 mg/dL — ABNORMAL HIGH (ref 8–23)
BUN: 42 mg/dL — ABNORMAL HIGH (ref 8–23)
BUN: 45 mg/dL — ABNORMAL HIGH (ref 8–23)
CO2: 19 mmol/L — ABNORMAL LOW (ref 22–32)
CO2: 18 mmol/L — ABNORMAL LOW (ref 22–32)
CO2: 18 mmol/L — ABNORMAL LOW (ref 22–32)
Calcium: 9.2 mg/dL (ref 8.9–10.3)
Calcium: 9.3 mg/dL (ref 8.9–10.3)
Calcium: 9.3 mg/dL (ref 8.9–10.3)
Chloride: 98 mmol/L (ref 98–111)
Chloride: 99 mmol/L (ref 98–111)
Chloride: 99 mmol/L (ref 98–111)
Creatinine, Ser: 2.71 mg/dL — ABNORMAL HIGH (ref 0.44–1.00)
Creatinine, Ser: 2.66 mg/dL — ABNORMAL HIGH (ref 0.44–1.00)
Creatinine, Ser: 2.63 mg/dL — ABNORMAL HIGH (ref 0.44–1.00)
GFR, Estimated: 17 mL/min — ABNORMAL LOW
GFR, Estimated: 18 mL/min — ABNORMAL LOW
GFR, Estimated: 18 mL/min — ABNORMAL LOW
Glucose, Bld: 336 mg/dL — ABNORMAL HIGH (ref 70–99)
Glucose, Bld: 327 mg/dL — ABNORMAL HIGH (ref 70–99)
Glucose, Bld: 284 mg/dL — ABNORMAL HIGH (ref 70–99)
Potassium: 6.6 mmol/L (ref 3.5–5.1)
Potassium: 6 mmol/L — ABNORMAL HIGH (ref 3.5–5.1)
Potassium: 5.2 mmol/L — ABNORMAL HIGH (ref 3.5–5.1)
Sodium: 133 mmol/L — ABNORMAL LOW (ref 135–145)
Sodium: 133 mmol/L — ABNORMAL LOW (ref 135–145)
Sodium: 134 mmol/L — ABNORMAL LOW (ref 135–145)

## 2024-09-18 LAB — URINALYSIS, ROUTINE W REFLEX MICROSCOPIC
Bilirubin Urine: NEGATIVE
Glucose, UA: >=500 mg/dL — AB
Ketones, ur: NEGATIVE mg/dL
Leukocytes,Ua: NEGATIVE
Nitrite: NEGATIVE
Protein, ur: 100 mg/dL — AB
Specific Gravity, Urine: 1.018 (ref 1.005–1.030)
pH: 5 (ref 5.0–8.0)

## 2024-09-18 LAB — I-STAT CHEM 8, ED
BUN: 50 mg/dL — ABNORMAL HIGH (ref 8–23)
Calcium, Ion: 0.84 mmol/L — CL (ref 1.15–1.40)
Chloride: 109 mmol/L (ref 98–111)
Creatinine, Ser: 2.8 mg/dL — ABNORMAL HIGH (ref 0.44–1.00)
Glucose, Bld: 368 mg/dL — ABNORMAL HIGH (ref 70–99)
HCT: 37 % (ref 36.0–46.0)
Hemoglobin: 12.6 g/dL (ref 12.0–15.0)
Potassium: 7.5 mmol/L (ref 3.5–5.1)
Sodium: 130 mmol/L — ABNORMAL LOW (ref 135–145)
TCO2: 14 mmol/L — ABNORMAL LOW (ref 22–32)

## 2024-09-18 LAB — COMPREHENSIVE METABOLIC PANEL WITH GFR
ALT: 58 U/L — ABNORMAL HIGH (ref 0–44)
AST: 128 U/L — ABNORMAL HIGH (ref 15–41)
Albumin: 3.5 g/dL (ref 3.5–5.0)
Alkaline Phosphatase: 157 U/L — ABNORMAL HIGH (ref 38–126)
Anion gap: 17 — ABNORMAL HIGH (ref 5–15)
BUN: 40 mg/dL — ABNORMAL HIGH (ref 8–23)
CO2: 15 mmol/L — ABNORMAL LOW (ref 22–32)
Calcium: 9.1 mg/dL (ref 8.9–10.3)
Chloride: 97 mmol/L — ABNORMAL LOW (ref 98–111)
Creatinine, Ser: 2.65 mg/dL — ABNORMAL HIGH (ref 0.44–1.00)
GFR, Estimated: 18 mL/min — ABNORMAL LOW
Glucose, Bld: 367 mg/dL — ABNORMAL HIGH (ref 70–99)
Potassium: >7.5 mmol/L (ref 3.5–5.1)
Sodium: 129 mmol/L — ABNORMAL LOW (ref 135–145)
Total Bilirubin: 0.9 mg/dL (ref 0.0–1.2)
Total Protein: 9.2 g/dL — ABNORMAL HIGH (ref 6.5–8.1)

## 2024-09-18 LAB — TROPONIN T, HIGH SENSITIVITY
Troponin T High Sensitivity: 427 ng/L (ref 0–19)
Troponin T High Sensitivity: 413 ng/L (ref 0–19)

## 2024-09-18 LAB — RESP PANEL BY RT-PCR (RSV, FLU A&B, COVID)  RVPGX2
Influenza A by PCR: NEGATIVE
Influenza B by PCR: NEGATIVE
Resp Syncytial Virus by PCR: NEGATIVE
SARS Coronavirus 2 by RT PCR: NEGATIVE

## 2024-09-18 LAB — CBC WITH DIFFERENTIAL/PLATELET
Abs Immature Granulocytes: 0.17 10*3/uL — ABNORMAL HIGH (ref 0.00–0.07)
Basophils Absolute: 0 10*3/uL (ref 0.0–0.1)
Basophils Relative: 0 %
Eosinophils Absolute: 0 10*3/uL (ref 0.0–0.5)
Eosinophils Relative: 0 %
HCT: 37 % (ref 36.0–46.0)
Hemoglobin: 10.6 g/dL — ABNORMAL LOW (ref 12.0–15.0)
Immature Granulocytes: 1 %
Lymphocytes Relative: 4 %
Lymphs Abs: 0.5 10*3/uL — ABNORMAL LOW (ref 0.7–4.0)
MCH: 25.6 pg — ABNORMAL LOW (ref 26.0–34.0)
MCHC: 28.6 g/dL — ABNORMAL LOW (ref 30.0–36.0)
MCV: 89.4 fL (ref 80.0–100.0)
Monocytes Absolute: 0.5 10*3/uL (ref 0.1–1.0)
Monocytes Relative: 4 %
Neutro Abs: 12.6 10*3/uL — ABNORMAL HIGH (ref 1.7–7.7)
Neutrophils Relative %: 91 %
Platelets: 196 10*3/uL (ref 150–400)
RBC: 4.14 MIL/uL (ref 3.87–5.11)
RDW: 19 % — ABNORMAL HIGH (ref 11.5–15.5)
WBC: 13.8 10*3/uL — ABNORMAL HIGH (ref 4.0–10.5)
nRBC: 0 % (ref 0.0–0.2)

## 2024-09-18 LAB — CBG MONITORING, ED
Glucose-Capillary: 302 mg/dL — ABNORMAL HIGH (ref 70–99)
Glucose-Capillary: 291 mg/dL — ABNORMAL HIGH (ref 70–99)
Glucose-Capillary: 260 mg/dL — ABNORMAL HIGH (ref 70–99)

## 2024-09-18 LAB — CBC
HCT: 33.2 % — ABNORMAL LOW (ref 36.0–46.0)
Hemoglobin: 9.8 g/dL — ABNORMAL LOW (ref 12.0–15.0)
MCH: 25.3 pg — ABNORMAL LOW (ref 26.0–34.0)
MCHC: 29.5 g/dL — ABNORMAL LOW (ref 30.0–36.0)
MCV: 85.6 fL (ref 80.0–100.0)
Platelets: 210 10*3/uL (ref 150–400)
RBC: 3.88 MIL/uL (ref 3.87–5.11)
RDW: 18.8 % — ABNORMAL HIGH (ref 11.5–15.5)
WBC: 11.5 10*3/uL — ABNORMAL HIGH (ref 4.0–10.5)
nRBC: 0 % (ref 0.0–0.2)

## 2024-09-18 MED ORDER — INSULIN ASPART 100 UNIT/ML IV SOLN
5.0000 [IU] | Freq: Once | INTRAVENOUS | Status: AC
  Administered 2024-09-18: 5 [IU] via INTRAVENOUS
  Filled 2024-09-18: qty 5

## 2024-09-18 MED ORDER — ALBUTEROL SULFATE (2.5 MG/3ML) 0.083% IN NEBU
3.0000 mL | INHALATION_SOLUTION | Freq: Four times a day (QID) | RESPIRATORY_TRACT | Status: DC | PRN
  Administered 2024-09-20: 3 mL via RESPIRATORY_TRACT
  Filled 2024-09-18: qty 3

## 2024-09-18 MED ORDER — CALCIUM GLUCONATE 10 % IV SOLN
1.0000 g | Freq: Once | INTRAVENOUS | Status: AC
  Administered 2024-09-18: 1 g via INTRAVENOUS
  Filled 2024-09-18: qty 10

## 2024-09-18 MED ORDER — INSULIN GLARGINE-YFGN 100 UNIT/ML ~~LOC~~ SOLN
20.0000 [IU] | Freq: Every day | SUBCUTANEOUS | Status: DC
  Administered 2024-09-18 – 2024-09-19 (×2): 20 [IU] via SUBCUTANEOUS
  Filled 2024-09-18 (×3): qty 0.2

## 2024-09-18 MED ORDER — INSULIN ASPART 100 UNIT/ML IJ SOLN
5.0000 [IU] | Freq: Once | INTRAMUSCULAR | Status: AC
  Administered 2024-09-18: 5 [IU] via INTRAVENOUS
  Filled 2024-09-18: qty 5

## 2024-09-18 MED ORDER — SODIUM ZIRCONIUM CYCLOSILICATE 10 G PO PACK
10.0000 g | PACK | Freq: Once | ORAL | Status: AC
  Administered 2024-09-18: 10 g via ORAL
  Filled 2024-09-18: qty 1

## 2024-09-18 MED ORDER — ALBUTEROL SULFATE (2.5 MG/3ML) 0.083% IN NEBU
10.0000 mg | INHALATION_SOLUTION | Freq: Once | RESPIRATORY_TRACT | Status: AC
  Administered 2024-09-18: 10 mg via RESPIRATORY_TRACT
  Filled 2024-09-18: qty 12

## 2024-09-18 MED ORDER — SODIUM CHLORIDE 0.9 % IV BOLUS
1000.0000 mL | Freq: Once | INTRAVENOUS | Status: AC
  Administered 2024-09-18: 1000 mL via INTRAVENOUS

## 2024-09-18 MED ORDER — HEPARIN SODIUM (PORCINE) 5000 UNIT/ML IJ SOLN
5000.0000 [IU] | Freq: Three times a day (TID) | INTRAMUSCULAR | Status: AC
  Administered 2024-09-18 – 2024-09-21 (×10): 5000 [IU] via SUBCUTANEOUS
  Filled 2024-09-18 (×10): qty 1

## 2024-09-18 MED ORDER — LACTATED RINGERS IV SOLN: INTRAVENOUS | Status: DC

## 2024-09-18 MED ORDER — SODIUM BICARBONATE 8.4 % IV SOLN
INTRAVENOUS | Status: AC
  Filled 2024-09-18: qty 50

## 2024-09-18 MED ORDER — METOPROLOL SUCCINATE ER 25 MG PO TB24
12.5000 mg | ORAL_TABLET | Freq: Every day | ORAL | Status: DC
  Administered 2024-09-19: 12.5 mg via ORAL
  Filled 2024-09-18: qty 1

## 2024-09-18 MED ORDER — SODIUM BICARBONATE 8.4 % IV SOLN
50.0000 meq | Freq: Once | INTRAVENOUS | Status: DC
  Filled 2024-09-18: qty 50

## 2024-09-18 MED ORDER — DAPAGLIFLOZIN PROPANEDIOL 10 MG PO TABS: 10.0000 mg | ORAL_TABLET | Freq: Every day | ORAL | Status: DC

## 2024-09-18 MED ORDER — STERILE WATER FOR INJECTION IV SOLN
INTRAVENOUS | Status: DC
  Filled 2024-09-18 (×2): qty 150

## 2024-09-18 MED ORDER — SODIUM ZIRCONIUM CYCLOSILICATE 10 G PO PACK
10.0000 g | PACK | Freq: Once | ORAL | Status: AC
  Administered 2024-09-19: 10 g via ORAL
  Filled 2024-09-18: qty 1

## 2024-09-18 MED ORDER — LEVOTHYROXINE SODIUM 75 MCG PO TABS
75.0000 ug | ORAL_TABLET | Freq: Every day | ORAL | Status: AC
  Administered 2024-09-19 – 2024-09-21 (×3): 75 ug via ORAL
  Filled 2024-09-18 (×3): qty 1

## 2024-09-18 MED ORDER — INSULIN ASPART 100 UNIT/ML IJ SOLN
0.0000 [IU] | Freq: Three times a day (TID) | INTRAMUSCULAR | Status: DC
  Administered 2024-09-19 (×2): 2 [IU] via SUBCUTANEOUS
  Filled 2024-09-18: qty 2
  Filled 2024-09-18: qty 1

## 2024-09-18 MED ORDER — FLUTICASONE FUROATE-VILANTEROL 200-25 MCG/ACT IN AEPB
1.0000 | INHALATION_SPRAY | Freq: Every day | RESPIRATORY_TRACT | Status: AC
  Administered 2024-09-20 – 2024-09-21 (×2): 1 via RESPIRATORY_TRACT
  Filled 2024-09-18 (×2): qty 28

## 2024-09-18 NOTE — ED Provider Notes (Signed)
 " [place] EMERGENC[facility] DEPARTMENT AT [facility] Provider Note   CSN: [id number] Arrival date & time: 09/18/24  1341     Patient presents with: Chest Pain (/) and Shortness of Breath   Jenna [name] is a 79 y.o. female.    Chest Pain Associated symptoms: shortness of breath   Shortness of Breath Associated symptoms: chest pain   Patient presents with chest pain.  Began last night around 6 PM.  Also shortness of breath.  Reportedly is pain-free now.  History of pleural effusion.  Has had previous drainage and even Pleur-evac for a while.  Initially had been called as a STEMI but canceled by Dr. Wonda prior to arrival.   Past Medical History:  Diagnosis Date   Blood transfusion without reported diagnosis    Cataract    DM (diabetes mellitus) (HCC)    Dyspnea    GERD (gastroesophageal reflux disease)    Hyperlipidemia    Hypertension    Iron  deficiency anemia    NSCL ca dx'd 03/2018   Pneumonia    Thyroid  disease     Prior to Admission medications  Medication Sig Start Date End Date Taking? Authorizing Provider  Accu-Chek Softclix Lancets lancets Use as instructed 06/16/23   Dartha Ernst, MD  albuterol  (VENTOLIN  HFA) 108 (90 Base) MCG/ACT inhaler Inhale 1-2 puffs into the lungs every 6 (six) hours as needed for wheezing or shortness of breath. 12/23/23   [name] LABOR, MD  BD PEN NEEDLE MINI ULTRAFINE 31G X 5 MM MISC USE ONCE DAILY 06/19/24   [name] LABOR, MD  blood glucose meter kit and supplies KIT Use up to four times daily as directed/accu check guide kit Patient not taking: Reported on 09/05/2024 06/22/23   Motwani, Komal, MD  blood glucose meter kit and supplies Dispense based on patient and insurance preference. Use up to four times daily as directed. (FOR ICD-10 E10.9, E11.9). Patient not taking: Reported on 09/05/2024 06/16/23   Motwani, Komal, MD  Continuous Glucose Sensor (DEXCOM G7 SENSOR) MISC 1 Device by Does not apply route  continuous. Patient not taking: Reported on 09/05/2024 01/27/23   Dartha Ernst, MD  diphenhydrAMINE  (BENADRYL ) 2 % cream Apply 1 application topically as needed for itching.    [provider]  FARXIGA  10 MG TABS tablet Take 1 tablet (10 mg total) by mouth daily before breakfast. Follow-up appt due in Nov must see provider for future refills 12/23/23   [name] LABOR, MD  Ferrous Sulfate  (IRON ) 325 (65 Fe) MG TABS Take 1 tablet (325 mg total) by mouth 2 (two) times daily with a meal. 12/25/20   Cirigliano, Ronal POUR, DO  fluticasone  furoate-vilanterol (BREO ELLIPTA ) 200-25 MCG/ACT AEPB Inhale 1 puff into the lungs daily. 12/23/23   [name] LABOR, MD  furosemide  (LASIX ) 20 MG tablet Take 1 tablet (20 mg total) by mouth daily as needed. 12/23/23   [name] LABOR, MD  Glucose Blood (BLOOD GLUCOSE TEST STRIPS 333) STRP 1 strip by In Vitro route 4 (four) times daily. 06/20/23   Motwani, Komal, MD  insulin  glargine (LANTUS  SOLOSTAR) 100 UNIT/ML Solostar Pen Inject 34 Units into the skin daily. And 31G, 5mm pen needles 1/day Patient taking differently: Inject 30 Units into the skin daily. And 31G, 5mm pen needles 1/day 12/23/23   [name] LABOR, MD  Insulin  Pen Needle (PEN NEEDLES) 32G X 4 MM MISC 1 Device by Does not apply route in the morning, at noon, in the evening,  and at bedtime. 06/21/24   Motwani, Komal, MD  Insulin  Pen Needle 32G X 4 MM MISC Inject 1 Needle into the skin every morning. 07/07/23   Motwani, Komal, MD  levothyroxine  (SYNTHROID ) 75 MCG tablet Take 1 tablet (75 mcg total) by mouth daily. 12/23/23   [name] LABOR, MD  meclizine  (ANTIVERT ) 25 MG tablet Take 1 tablet (25 mg total) by mouth 3 (three) times daily as needed for dizziness. 05/21/23   Raford Lenis, MD  metoprolol  succinate (TOPROL  XL) 25 MG 24 hr tablet Take 0.5 tablets (12.5 mg total) by mouth daily. 12/23/23   [name] LABOR, MD  Multiple Vitamin (MULTIVITAMIN) capsule Take 1 capsule by mouth  daily.    [provider]  pravastatin  (PRAVACHOL ) 20 MG tablet Take 1 tablet (20 mg total) by mouth daily. 12/23/23   [name] LABOR, MD  promethazine -dextromethorphan  (PROMETHAZINE -DM) 6.25-15 MG/5ML syrup Take 5 mLs by mouth 4 (four) times daily as needed. 07/04/24   [name] LABOR, MD  tirzepatide  (MOUNJARO ) 2.5 MG/0.5ML Pen Inject 2.5 mg into the skin once a week. Patient not taking: Reported on 09/05/2024 04/09/24   Motwani, Komal, MD    Allergies: Paclitaxel , Aspirin, Esomeprazole magnesium , Losartan , and Ace inhibitors    Review of Systems  Respiratory:  Positive for shortness of breath.   Cardiovascular:  Positive for chest pain.    Updated Vital Signs BP (!) 141/50   Pulse 72   Temp (!) 97.4 F (36.3 C) (Temporal)   Resp (!) 32   Ht 5' 5 (1.651 m)   Wt 90.7 kg   SpO2 100%   BMI 33.28 kg/m   Physical Exam Vitals and nursing note reviewed.  Cardiovascular:     Rate and Rhythm: Regular rhythm.  Pulmonary:     Breath sounds: No decreased breath sounds.     Comments: Somewhat harsh breath sounds diffusely. Musculoskeletal:     Right lower leg: Edema present.     Left lower leg: Edema present.     Comments: Mild edema bilateral lower extremities.  Skin:    Capillary Refill: Capillary refill takes less than 2 seconds.  Neurological:     Mental Status: She is alert.     (all labs ordered are listed, but only abnormal results are displayed) Labs Reviewed  CBC WITH DIFFERENTIAL/PLATELET - Abnormal; Notable for the following components:      Result Value   WBC 13.8 (*)    Hemoglobin 10.6 (*)    MCH 25.6 (*)    MCHC 28.6 (*)    RDW 19.0 (*)    Neutro Abs 12.6 (*)    Lymphs Abs 0.5 (*)    Abs Immature Granulocytes 0.17 (*)    All other components within normal limits  COMPREHENSIVE METABOLIC PANEL WITH GFR - Abnormal; Notable for the following components:   Sodium 129 (*)    Potassium >7.5 (*)    Chloride 97 (*)    CO2 15 (*)    Glucose,  Bld 367 (*)    BUN 40 (*)    Creatinine, Ser 2.65 (*)    Total Protein 9.2 (*)    AST 128 (*)    ALT 58 (*)    Alkaline Phosphatase 157 (*)    GFR, Estimated 18 (*)    Anion gap 17 (*)    All other components within normal limits  I-STAT CHEM 8, ED - Abnormal; Notable for the following components:   Sodium 130 (*)    Potassium 7.5 (*)  BUN 50 (*)    Creatinine, Ser 2.80 (*)    Glucose, Bld 368 (*)    Calcium , Ion 0.84 (*)    TCO2 14 (*)    All other components within normal limits  TROPONIN T, HIGH SENSITIVITY - Abnormal; Notable for the following components:   Troponin T High Sensitivity 427 (*)    All other components within normal limits  RESP PANEL BY RT-PCR (RSV, FLU A&B, COVID)  RVPGX2  BASIC METABOLIC PANEL WITH GFR  TROPONIN T, HIGH SENSITIVITY    EKG: EKG Interpretation Date/Time:  Tuesday September 18 2024 13:54:42 EST Ventricular Rate:  68 PR Interval:    QRS Duration:  112 QT Interval:  444 QTC Calculation: 473 R Axis:   -37  Text Interpretation: Junctional rhythm Left ventricular hypertrophy T waves more prominent.  Some ST changes but not in a specific vascular distribution. Confirmed by Patsey Lot [phone]) on 09/18/2024 2:50:29 PM  Radiology: DG Chest Portable 1 View Result Date: 09/18/2024 CLINICAL DATA:  Shortness of breath. EXAM: PORTABLE CHEST 1 VIEW COMPARISON:  07/04/2024. FINDINGS: Redemonstration of heterogeneous opacification of right lower hemithorax, essentially similar to the prior study. Findings are better characterized on the prior CT scan from 07/30/2024. No significant interval change. There is small right pleural effusion, also grossly unchanged. There are nonspecific interstitial and nodular opacities in the left lung, more so in the lower lung zone. No left lung dense consolidation, lung collapse or lung mass. No left pleural effusion. Stable cardio-mediastinal silhouette. No acute osseous abnormalities. The soft tissues are within  normal limits. IMPRESSION: *No significant interval change since the prior study. Redemonstration of heterogeneous opacification of right lower hemithorax, essentially similar to the prior study. Findings are better characterized on the prior CT scan from 07/30/2024. There is small right pleural effusion, also grossly unchanged. Electronically Signed   By: Ree Molt M.D.   On: 09/18/2024 14:51     Procedures   Medications Ordered in the ED  sodium bicarbonate  injection 50 mEq (50 mEq Intravenous Not Given 09/18/24 1440)  sodium chloride  0.9 % bolus 1,000 mL (has no administration in time range)  insulin  aspart (novoLOG ) injection 5 Units (5 Units Intravenous Given 09/18/24 1440)  sodium zirconium cyclosilicate  (LOKELMA ) packet 10 g (10 g Oral Given 09/18/24 1455)  albuterol  (PROVENTIL ) (2.5 MG/3ML) 0.083% nebulizer solution 10 mg (10 mg Nebulization Given 09/18/24 1449)  calcium  gluconate inj 10% (1 g) URGENT USE ONLY! (1 g Intravenous Given 09/18/24 1442)  sodium bicarbonate  1 mEq/mL injection (  Given 09/18/24 1459)                                    Medical Decision Making Amount and/or Complexity of Data Reviewed Labs: ordered. Radiology: ordered.  Risk OTC drugs. Prescription drug management.   Patient came in his chest pain.  Shortness of breath.  Initially EMS had called STEMI but had been canceled prior to arrival.  I also discussed with Dr. Wonda.  Not a good vessel distribution to give us  the changes because his active hyperkalemia considered.  Initial i-STAT shows elevated creatinine at about baseline and hyperkalemia.  However unknown if there is any hemolysis.  Has had some diarrhea.  May have some decreased oral intake.  Will treat emergently however for the hyperkalemia.  Will recheck.  CMP also shows elevation but does have visible hemolysis.  Will add BNP with a new draw.  Will require admission to the hospital.  Care turned over to Dr. Ismael.  CRITICAL  CARE Performed by: [place] Total critical care time: Manage there is a30 on the hyper-K minutes Critical care time was exclusive of separately billable procedures and treating other patients. Critical care was necessary to treat or prevent imminent or life-threatening deterioration. Critical care was time spent personally by me on the following activities: development of treatment plan with patient and/or surrogate as well as nursing, discussions with consultants, evaluation of patient's response to treatment, examination of patient, obtaining history from patient or surrogate, ordering and performing treatments and interventions, ordering and review of laboratory studies, ordering and review of radiographic studies, pulse oximetry and re-evaluation of patient's condition.      Final diagnoses:  None    ED Discharge Orders     None          River Rankin, MD 09/18/24 1528  "

## 2024-09-18 NOTE — ED Triage Notes (Signed)
 Patient bib [facility] for midline/epigastric chest pain that a;sp is fel;t in the left chest. No radiation to back or neck or arm. She started having pain last night around 6pm. She also had a fall last night. EMS reports widespread elevation on EKG and distended abdomen.  She was hyperglycemic with EMS with a CBG og 463. Does have a history of diabetes and does not know the last time she took her insulin .

## 2024-09-19 ENCOUNTER — Inpatient Hospital Stay (HOSPITAL_COMMUNITY)

## 2024-09-19 ENCOUNTER — Ambulatory Visit: Admitting: "Endocrinology

## 2024-09-19 DIAGNOSIS — R55 Syncope and collapse: Secondary | ICD-10-CM | POA: Diagnosis not present

## 2024-09-19 LAB — BASIC METABOLIC PANEL WITH GFR
Anion gap: 13 (ref 5–15)
Anion gap: 14 (ref 5–15)
Anion gap: 13 (ref 5–15)
Anion gap: 13 (ref 5–15)
BUN: 46 mg/dL — ABNORMAL HIGH (ref 8–23)
BUN: 47 mg/dL — ABNORMAL HIGH (ref 8–23)
BUN: 48 mg/dL — ABNORMAL HIGH (ref 8–23)
BUN: 50 mg/dL — ABNORMAL HIGH (ref 8–23)
CO2: 22 mmol/L (ref 22–32)
CO2: 22 mmol/L (ref 22–32)
CO2: 22 mmol/L (ref 22–32)
CO2: 24 mmol/L (ref 22–32)
Calcium: 8.8 mg/dL — ABNORMAL LOW (ref 8.9–10.3)
Calcium: 8.9 mg/dL (ref 8.9–10.3)
Calcium: 8.9 mg/dL (ref 8.9–10.3)
Calcium: 8.7 mg/dL — ABNORMAL LOW (ref 8.9–10.3)
Chloride: 97 mmol/L — ABNORMAL LOW (ref 98–111)
Chloride: 97 mmol/L — ABNORMAL LOW (ref 98–111)
Chloride: 97 mmol/L — ABNORMAL LOW (ref 98–111)
Chloride: 98 mmol/L (ref 98–111)
Creatinine, Ser: 2.46 mg/dL — ABNORMAL HIGH (ref 0.44–1.00)
Creatinine, Ser: 2.48 mg/dL — ABNORMAL HIGH (ref 0.44–1.00)
Creatinine, Ser: 2.32 mg/dL — ABNORMAL HIGH (ref 0.44–1.00)
Creatinine, Ser: 2.36 mg/dL — ABNORMAL HIGH (ref 0.44–1.00)
GFR, Estimated: 19 mL/min — ABNORMAL LOW
GFR, Estimated: 19 mL/min — ABNORMAL LOW
GFR, Estimated: 21 mL/min — ABNORMAL LOW
GFR, Estimated: 20 mL/min — ABNORMAL LOW
Glucose, Bld: 286 mg/dL — ABNORMAL HIGH (ref 70–99)
Glucose, Bld: 287 mg/dL — ABNORMAL HIGH (ref 70–99)
Glucose, Bld: 145 mg/dL — ABNORMAL HIGH (ref 70–99)
Glucose, Bld: 150 mg/dL — ABNORMAL HIGH (ref 70–99)
Potassium: 4.9 mmol/L (ref 3.5–5.1)
Potassium: 4.9 mmol/L (ref 3.5–5.1)
Potassium: 4.8 mmol/L (ref 3.5–5.1)
Potassium: 4.7 mmol/L (ref 3.5–5.1)
Sodium: 132 mmol/L — ABNORMAL LOW (ref 135–145)
Sodium: 133 mmol/L — ABNORMAL LOW (ref 135–145)
Sodium: 132 mmol/L — ABNORMAL LOW (ref 135–145)
Sodium: 134 mmol/L — ABNORMAL LOW (ref 135–145)

## 2024-09-19 LAB — ECHOCARDIOGRAM COMPLETE
Area-P 1/2: 5.23 cm2
Height: 65 in
S' Lateral: 2.8 cm
Weight: 3200 [oz_av]

## 2024-09-19 LAB — CBC
HCT: 29.4 % — ABNORMAL LOW (ref 36.0–46.0)
Hemoglobin: 8.9 g/dL — ABNORMAL LOW (ref 12.0–15.0)
MCH: 25 pg — ABNORMAL LOW (ref 26.0–34.0)
MCHC: 30.3 g/dL (ref 30.0–36.0)
MCV: 82.6 fL (ref 80.0–100.0)
Platelets: 181 10*3/uL (ref 150–400)
RBC: 3.56 MIL/uL — ABNORMAL LOW (ref 3.87–5.11)
RDW: 18.8 % — ABNORMAL HIGH (ref 11.5–15.5)
WBC: 10.3 10*3/uL (ref 4.0–10.5)
nRBC: 0 % (ref 0.0–0.2)

## 2024-09-19 LAB — GLUCOSE, CAPILLARY
Glucose-Capillary: 162 mg/dL — ABNORMAL HIGH (ref 70–99)
Glucose-Capillary: 118 mg/dL — ABNORMAL HIGH (ref 70–99)
Glucose-Capillary: 88 mg/dL (ref 70–99)

## 2024-09-19 LAB — CBG MONITORING, ED
Glucose-Capillary: 189 mg/dL — ABNORMAL HIGH (ref 70–99)
Glucose-Capillary: 279 mg/dL — ABNORMAL HIGH (ref 70–99)

## 2024-09-19 MED ORDER — ACETAMINOPHEN 325 MG PO TABS
650.0000 mg | ORAL_TABLET | Freq: Four times a day (QID) | ORAL | Status: AC | PRN
  Administered 2024-09-19: 650 mg via ORAL
  Filled 2024-09-19: qty 2

## 2024-09-19 MED ORDER — SODIUM CHLORIDE 0.9 % IV SOLN: INTRAVENOUS | Status: AC

## 2024-09-19 NOTE — Discharge Instructions (Signed)
 To address social isolation and forming connections:  Education Officer, [employer]: [phone] / [address] [place], [place] [address]  -[employer]: From community-building activities like luncheons and group outings to fitness activities like chair tap, tai chi and more, [employer] has a variety of offerings specifically created for active older adults. Some of these high-quality programs are free and included with your membership, while others require an additional fee. Some Medicare Plans cover Silver Sneakers programs.   [facility] Branch: [address] [place], [place] [address]. Phone [phone].  [facility] Branch: [address], [place], [place] [address]. Phone [phone]. This group of seniors, called the W[facility]ell Schiff, meets every Tuesday at 12:30pm. Enjoy bingo, one-day-trips, fun activities, and so much more!  -[employer] Branch: [address] [place], [place] [address]. Phone [phone]. Tai chi is often described as meditation in motion. There is growing evidence that this mind-body practice has value in treating or preventing many health problems. You can get started even if you arent in top shape or the best of health. Offered Monday & Wednesday at 10:45 am in Room 1, $35 for Members and $55 for Community Guests per month.  [facility] Branch: [address] [place], [place] [address]. Phone [phone]. Tai chi is often described as meditation in motion. There is growing evidence that this mind-body practice has value in treating or preventing many health problems. You can get started even if you arent in top shape or the best of health. Offered Monday & Wednesday at 8:50am, cost is $35 per month for [facility] Members and $60 per month for [employer].  [employer] Branch: [address] [place], [place], [place] [address]. Phone [phone]  -[facility] Branch: [address] [place], [place]  [address]. Phone [phone].  -[facility] Branch: [address] [place], [place] [address]. Phone [phone].  -Dial 988: Talk lifeline 24/7.  -[place]  - [facility] Warmline: [phone]  -Enroll in PACE program (Program of All-Inclusive Care for the Elderly): a Medicare and Medicaid initiative that provides comprehensive medical and social services to frail seniors (55+) who need nursing home-level care but prefer to stay in their communities, allowing them to age in place with support like primary care, therapy, meals, and transportation, coordinated by an interdisciplinary team. If you have both Medicare (for people 65+) and Medicaid (income-tested), then you may pay nothing. People without Medicare or Medicaid pay a monthly premium. The amount of this premium depends on your healthcare and financial needs. Long-term care insurance may pay for your PACE care. This coverage is determined by your insurer. Enrollment Information: [phone] Office: [phone]

## 2024-09-19 NOTE — Progress Notes (Signed)
 Nephrology Follow-Up Consult note   Assessment/Recommendations: Jenna [name] is a/an 79 y.o. female with a past medical history significant for DM2, CKD 3b, HTN, admitted for hyperkalemia and AKI.       AKI on CKD 3b: BL 1.5. Likely some dehydration and tubular injury -improved with hydration -Continue to monitor daily Cr, Dose meds for GFR -Monitor Daily I/Os, Daily weight  -Maintain MAP>65 for optimal renal perfusion.  -Avoid nephrotoxic medications including NSAIDs -Use synthetic opioids (Fentanyl /Dilaudid ) if needed  Severe Hyperkalemia: now much improved with normal K. Should improve as AKI resolves  HTN: cont BP meds as ordered AGMA: now improved. No need for further bicarb  Uncontrolled Diabetes Mellitus Type 2 with Hyperglycemia: mgmt per primary   Recommendations conveyed to primary service.    [facility] 09/19/2024 1:02 PM  ___________________________________________________________  CC: syncope  Interval History/Subjective: Still feels off today. No SOB or edema. Feels like she is urinating okay. Labs improved   Medications:  Current Facility-Administered Medications  Medication Dose Route Frequency Provider Last Rate Last Admin   0.9 %  sodium chloride  infusion   Intravenous Continuous Tobie Gordy POUR, MD 125 mL/hr at 09/19/24 0650 New Bag at 09/19/24 0650   acetaminophen  (TYLENOL ) tablet 650 mg  650 mg Oral Q6H PRN Patel, Jay K, MD   650 mg at 09/19/24 0802   albuterol  (PROVENTIL ) (2.5 MG/3ML) 0.083% nebulizer solution 3 mL  3 mL Inhalation Q6H PRN Mdala-Gausi, Golden Pillow, MD       fluticasone  furoate-vilanterol (BREO ELLIPTA ) 200-25 MCG/ACT 1 puff  1 puff Inhalation Daily Mdala-Gausi, Masiku Agatha, MD       heparin  injection 5,000 Units  5,000 Units Subcutaneous Q8H Mdala-Gausi, Masiku Agatha, MD   5,000 Units at 09/19/24 1259   insulin  aspart (novoLOG ) injection 0-9 Units  0-9 Units Subcutaneous TID WC  Mdala-Gausi, Masiku Agatha, MD   2 Units at 09/19/24 1258   insulin  glargine-yfgn injection 20 Units  20 Units Subcutaneous QHS Mdala-Gausi, Masiku Agatha, MD   20 Units at 09/18/24 2219   levothyroxine  (SYNTHROID ) tablet 75 mcg  75 mcg Oral Q0600 Mdala-Gausi, Masiku Agatha, MD   75 mcg at 09/19/24 9474   metoprolol  succinate (TOPROL -XL) 24 hr tablet 12.5 mg  12.5 mg Oral Daily Mdala-Gausi, Masiku Agatha, MD   12.5 mg at 09/19/24 9081   sodium bicarbonate  injection 50 mEq  50 mEq Intravenous Once Patsey Lot, MD          Review of Systems: 10 systems reviewed and negative except per interval history/subjective  Physical Exam: Vitals:   09/19/24 0918 09/19/24 1059  BP: (!) 135/51   Pulse: 88   Resp:  20  Temp:  98.2 F (36.8 C)  SpO2:     Total I/O In: 240 [P.O.:240] Out: -   Intake/Output Summary (Last 24 hours) at 09/19/2024 1302 Last data filed at 09/19/2024 1201 Gross per 24 hour  Intake 2716.98 ml  Output --  Net 2716.98 ml   Constitutional: well-appearing, no acute distress ENMT: ears and nose without scars or lesions, MMM CV: normal rate, trace edema Respiratory: bilateral chest rise, normal work of breathing Gastrointestinal: soft, non-tender, no palpable masses or hernias Skin: no visible lesions or rashes Psych: alert, judgement/insight appropriate, appropriate mood and affect   Test Results I personally reviewed new and old clinical labs and radiology tests Lab Results  Component Value Date   NA 132 (L) 09/19/2024   K 4.8 09/19/2024   CL 97 (L)  09/19/2024   CO2 22 09/19/2024   BUN 48 (H) 09/19/2024   CREATININE 2.32 (H) 09/19/2024   GFR 32.58 (L) 02/09/2024   CALCIUM  8.9 09/19/2024   ALBUMIN 3.5 09/18/2024   PHOS 3.8 08/03/2023    CBC Recent Labs  Lab 09/18/24 1400 09/18/24 1405 09/18/24 1817 09/19/24 0306  WBC 13.8*  --  11.5* 10.3  NEUTROABS 12.6*  --   --   --   HGB 10.6* 12.6 9.8* 8.9*  HCT 37.0 37.0 33.2* 29.4*  MCV 89.4  --  85.6  82.6  PLT 196  --  210 181

## 2024-09-19 NOTE — Progress Notes (Signed)
 " Progress Note   Patient: Jenna [name] FMW:[id number] DOB: 12/19/1945 DOA: 09/18/2024     1 DOS: the patient was seen and examined on 09/19/2024    Brief hospital course:  Jenna [name] is a 79 y.o. female with PMH of lung cancer, T2DM, hypertension, hypothyroidism who presented to the ED with complaints of chest pain, weakness, lightheadedness.  Initially a code STEMI was called and then refused.  Patient had EKG changes related to hyperkalemia.  Presented with potassium of >7.5. Temporizing measures were started and nephrology was consulted.  Assessment and Plan:  Hyperkalemia, resolved Unclear etiology. Patient does not currently on any medications that would cause hyperkalemia. She does not take potassium supplements at home. Patient had notable EKG changes on presentation (peaked T waves). S/p IV insulin , albuterol , Lokelma , calcium  gluconate. Nephrology consulted.  Input appreciated. Patient was on a bicarbonate infusion overnight. Potassium >7.7 -> 7.5 -> 6.6 -> 6.0 -> 5.2 -> 4.9 - Will continue to monitor closely. - Low potassium diet.  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 Patient presented with creatinine of 2.7. Nephrology following. -Continue IV fluids.   Metabolic acidosis, present on admission Likely due to impending DKA (euglycemic DKA due to Farxiga ). -S/p IV bicarbonate  - Will defer further management to nephrology.  T2DM with early DKA on presentation.  On insulin  at home. Patient presented with hyperglycemia and slight increase in anion gap. Likely euglycemic DKA due to Farxiga  use. Patient did not require insulin  infusion. - Continue IV fluids. - Continue Lantus  insulin , sliding scale  Syncope Patient reports syncopal episode at home. May be related to electrolyte imbalance, but given her presentation, would like to evaluate further. - TTE ordered.   Troponin elevation Troponin was 427 on presentation.  Repeat  troponin was 413. -Will repeat troponin if chest pain recurs.   Hypertension - Resumed home metoprolol .  History of lung cancer - Outpatient follow-up       Subjective: Patient states she is feeling better today.  She has no lightheadedness.  No chest pain.  She reports some shortness of breath when she walked to the bathroom this morning.  Physical Exam: BP (!) 135/51   Pulse 88   Temp 98.2 F (36.8 C) (Oral)   Resp 20   Ht 5' 5 (1.651 m)   Wt 90.7 kg   SpO2 100%   BMI 33.28 kg/m    General: Alert, oriented X3  Eyes: Pupils equal, reactive  Oral cavity: moist mucous membranes  Head: Atraumatic, normocephalic  Neck: supple  Chest: Diminished breath sounds right side CVS: S1,S2 RRR. No murmurs  Abd: No distention, soft, non-tender. No masses palpable  Extr: No edema   MSK: No joint deformities or swelling  Neurological: Grossly intact.    Data Reviewed:    Latest Ref Rng & Units 09/19/2024    3:06 AM 09/18/2024    6:17 PM 09/18/2024    2:05 PM  CBC  WBC 4.0 - 10.5 K/uL 10.3  11.5    Hemoglobin 12.0 - 15.0 g/dL 8.9  9.8  87.3   Hematocrit 36.0 - 46.0 % 29.4  33.2  37.0   Platelets 150 - 400 K/uL 181  210        Latest Ref Rng & Units 09/19/2024   11:10 AM 09/19/2024    3:06 AM 09/18/2024    9:55 PM  BMP  Glucose 70 - 99 mg/dL 854  713    712  715   BUN  8 - 23 mg/dL 48  46    47  45   Creatinine 0.44 - 1.00 mg/dL 7.67  7.53    7.51  7.36   Sodium 135 - 145 mmol/L 132  132    133  134   Potassium 3.5 - 5.1 mmol/L 4.8  4.9    4.9  5.2   Chloride 98 - 111 mmol/L 97  97    97  99   CO2 22 - 32 mmol/L 22  22    22  18    Calcium  8.9 - 10.3 mg/dL 8.9  8.9    8.8  9.3      Family Communication: n/a  Disposition: Status is: Inpatient Remains inpatient appropriate because: On IV fluids, requires ongoing monitoring for hyperkalemia, evaluating syncope  DVT PPx: SQ heparin       Author: MDALA-GAUSI, Tanner Yeley AGATHA, MD 09/19/2024 11:34 AM  For on call  review [email or web address].    "

## 2024-09-19 NOTE — Progress Notes (Signed)
"  ° °      Overnight   NAME: Jenna [name] MRN: [id number] DOB : [birth year]    Date of Service   09/19/2024   HPI/Events of Note    Notified by Attending for follow up on Hyperkalemia treatment regimen.  Current labs values are returned to in range.4.9 mmol/L   Latest Reference Range & Units 09/18/24 15:26 09/18/24 18:17 09/18/24 21:55 09/19/24 03:06  Sodium 135 - 145 mmol/L 135 - 145 mmol/L 133 (L) 133 (L) 134 (L) 132 (L) 133 (L)  Potassium 3.5 - 5.1 mmol/L 3.5 - 5.1 mmol/L 6.6 (HH) 6.0 (H) 5.2 (H) 4.9 4.9  Chloride 98 - 111 mmol/L 98 - 111 mmol/L 98 99 99 97 (L) 97 (L)  CO2 22 - 32 mmol/L 22 - 32 mmol/L 19 (L) 18 (L) 18 (L) 22 22  Glucose 70 - 99 mg/dL 70 - 99 mg/dL 663 (H) 672 (H) 715 (H) 286 (H) 287 (H)  BUN 8 - 23 mg/dL 8 - 23 mg/dL 41 (H) 42 (H) 45 (H) 46 (H) 47 (H)  Creatinine 0.44 - 1.00 mg/dL 9.55 - 8.99 mg/dL 7.28 (H) 7.33 (H) 7.36 (H) 2.46 (H) 2.48 (H)  Calcium  8.9 - 10.3 mg/dL 8.9 - 89.6 mg/dL 9.2 9.3 9.3 8.9 8.8 (L)  Anion gap 5 - 15  5 - 15  16 (H) 17 (H) 17 (H) 13 14  (HH): Data is critically high (L): Data is abnormally low (H): Data is abnormally high          Interventions/ Plan   Continue all Admitting Physician orders        Lynwood Kipper BSN MSNA MSN ACNPC-AG Acute Care Nurse Practitioner [facility]  "

## 2024-09-20 ENCOUNTER — Inpatient Hospital Stay (HOSPITAL_COMMUNITY)

## 2024-09-20 ENCOUNTER — Ambulatory Visit: Admitting: "Endocrinology

## 2024-09-20 DIAGNOSIS — R609 Edema, unspecified: Secondary | ICD-10-CM | POA: Diagnosis not present

## 2024-09-20 DIAGNOSIS — E875 Hyperkalemia: Secondary | ICD-10-CM | POA: Diagnosis not present

## 2024-09-20 LAB — CBC
HCT: 30.2 % — ABNORMAL LOW (ref 36.0–46.0)
Hemoglobin: 9.2 g/dL — ABNORMAL LOW (ref 12.0–15.0)
MCH: 25.4 pg — ABNORMAL LOW (ref 26.0–34.0)
MCHC: 30.5 g/dL (ref 30.0–36.0)
MCV: 83.4 fL (ref 80.0–100.0)
Platelets: 204 10*3/uL (ref 150–400)
RBC: 3.62 MIL/uL — ABNORMAL LOW (ref 3.87–5.11)
RDW: 19 % — ABNORMAL HIGH (ref 11.5–15.5)
WBC: 7.1 10*3/uL (ref 4.0–10.5)
nRBC: 0 % (ref 0.0–0.2)

## 2024-09-20 LAB — SEDIMENTATION RATE: Sed Rate: 90 mm/h — ABNORMAL HIGH (ref 0–22)

## 2024-09-20 LAB — C-REACTIVE PROTEIN: CRP: 16.1 mg/dL — ABNORMAL HIGH

## 2024-09-20 LAB — BASIC METABOLIC PANEL WITH GFR
Anion gap: 11 (ref 5–15)
BUN: 47 mg/dL — ABNORMAL HIGH (ref 8–23)
CO2: 24 mmol/L (ref 22–32)
Calcium: 8.6 mg/dL — ABNORMAL LOW (ref 8.9–10.3)
Chloride: 102 mmol/L (ref 98–111)
Creatinine, Ser: 2.12 mg/dL — ABNORMAL HIGH (ref 0.44–1.00)
GFR, Estimated: 23 mL/min — ABNORMAL LOW
Glucose, Bld: 112 mg/dL — ABNORMAL HIGH (ref 70–99)
Potassium: 4.4 mmol/L (ref 3.5–5.1)
Sodium: 136 mmol/L (ref 135–145)

## 2024-09-20 LAB — GLUCOSE, CAPILLARY
Glucose-Capillary: 83 mg/dL (ref 70–99)
Glucose-Capillary: 130 mg/dL — ABNORMAL HIGH (ref 70–99)
Glucose-Capillary: 176 mg/dL — ABNORMAL HIGH (ref 70–99)
Glucose-Capillary: 184 mg/dL — ABNORMAL HIGH (ref 70–99)

## 2024-09-20 LAB — HEMOGLOBIN A1C
Hgb A1c MFr Bld: 6.8 % — ABNORMAL HIGH (ref 4.8–5.6)
Mean Plasma Glucose: 148.46 mg/dL

## 2024-09-20 LAB — TROPONIN T, HIGH SENSITIVITY
Troponin T High Sensitivity: 495 ng/L (ref 0–19)
Troponin T High Sensitivity: 490 ng/L (ref 0–19)

## 2024-09-20 MED ORDER — PREDNISONE 20 MG PO TABS
20.0000 mg | ORAL_TABLET | Freq: Every day | ORAL | Status: AC
  Administered 2024-09-20 – 2024-09-21 (×2): 20 mg via ORAL
  Filled 2024-09-20 (×2): qty 1

## 2024-09-20 MED ORDER — SODIUM CHLORIDE 0.9 % IV BOLUS: 1000.0000 mL | Freq: Once | INTRAVENOUS | Status: DC

## 2024-09-20 MED ORDER — SODIUM CHLORIDE 0.9 % IV BOLUS
500.0000 mL | Freq: Once | INTRAVENOUS | Status: AC
  Administered 2024-09-20: 500 mL via INTRAVENOUS

## 2024-09-20 MED ORDER — METOPROLOL TARTRATE 5 MG/5ML IV SOLN
5.0000 mg | Freq: Once | INTRAVENOUS | Status: AC
  Administered 2024-09-20: 5 mg via INTRAVENOUS
  Filled 2024-09-20: qty 5

## 2024-09-20 MED ORDER — GUAIFENESIN ER 600 MG PO TB12
600.0000 mg | ORAL_TABLET | Freq: Two times a day (BID) | ORAL | Status: AC
  Administered 2024-09-20 – 2024-09-21 (×4): 600 mg via ORAL
  Filled 2024-09-20 (×4): qty 1

## 2024-09-20 MED ORDER — INSULIN GLARGINE-YFGN 100 UNIT/ML ~~LOC~~ SOLN
20.0000 [IU] | Freq: Every day | SUBCUTANEOUS | Status: AC
  Administered 2024-09-20 – 2024-09-21 (×2): 20 [IU] via SUBCUTANEOUS
  Filled 2024-09-20 (×2): qty 0.2

## 2024-09-20 MED ORDER — BENZONATATE 100 MG PO CAPS
100.0000 mg | ORAL_CAPSULE | Freq: Three times a day (TID) | ORAL | Status: AC
  Administered 2024-09-20 – 2024-09-21 (×6): 100 mg via ORAL
  Filled 2024-09-20 (×6): qty 1

## 2024-09-20 MED ORDER — INSULIN ASPART 100 UNIT/ML IJ SOLN
0.0000 [IU] | Freq: Three times a day (TID) | INTRAMUSCULAR | Status: AC
  Administered 2024-09-20: 2 [IU] via SUBCUTANEOUS
  Administered 2024-09-20: 3 [IU] via SUBCUTANEOUS
  Administered 2024-09-21 (×2): 2 [IU] via SUBCUTANEOUS
  Filled 2024-09-20 (×4): qty 1

## 2024-09-20 MED ORDER — IPRATROPIUM-ALBUTEROL 0.5-2.5 (3) MG/3ML IN SOLN: 3.0000 mL | RESPIRATORY_TRACT | Status: AC | PRN

## 2024-09-20 MED ORDER — SODIUM CHLORIDE 0.9 % IV SOLN: INTRAVENOUS | Status: DC

## 2024-09-20 MED ORDER — DEXTROMETHORPHAN POLISTIREX ER 30 MG/5ML PO SUER
30.0000 mg | Freq: Two times a day (BID) | ORAL | Status: AC
  Administered 2024-09-20 – 2024-09-21 (×4): 30 mg via ORAL
  Filled 2024-09-20 (×4): qty 5

## 2024-09-20 MED ORDER — METOPROLOL TARTRATE 5 MG/5ML IV SOLN: 2.5000 mg | Freq: Four times a day (QID) | INTRAVENOUS | Status: AC | PRN

## 2024-09-20 MED ORDER — MAGNESIUM SULFATE 2 GM/50ML IV SOLN
2.0000 g | Freq: Once | INTRAVENOUS | Status: AC
  Administered 2024-09-20: 2 g via INTRAVENOUS
  Filled 2024-09-20: qty 50

## 2024-09-20 MED ORDER — METOPROLOL TARTRATE 25 MG PO TABS
25.0000 mg | ORAL_TABLET | Freq: Three times a day (TID) | ORAL | Status: DC
  Administered 2024-09-20 – 2024-09-21 (×4): 25 mg via ORAL
  Filled 2024-09-20 (×3): qty 1
  Filled 2024-09-20: qty 2

## 2024-09-20 MED ORDER — INSULIN GLARGINE-YFGN 100 UNIT/ML ~~LOC~~ SOLN
10.0000 [IU] | Freq: Every day | SUBCUTANEOUS | Status: DC
  Filled 2024-09-20: qty 0.1

## 2024-09-20 MED ORDER — INSULIN ASPART 100 UNIT/ML IJ SOLN: 0.0000 [IU] | Freq: Every day | INTRAMUSCULAR | Status: AC

## 2024-09-20 NOTE — Progress Notes (Signed)
 After ambulating to and from the bathroom, patient's heart rate shot up to 180's and sustained. Per EKG, afib RVR. Valsava maneuvers failed to convert back. MD at the bedside. Only concerns of feeling tired. Dyspnea with exertion at baseline. Denies chest pain or dizziness.   Troponin trended, 500 ml NS bolus and 5mg  IV metoprolol  and IV magnesium  administered. Patient converted back to NSR with some ST elevations. Concerns for ?pericarditis per MD. Heloise on prednisone  PO. PO metoprolol  changed to immediate release with increased frequency. Patient has been stable the rest of the day with no new concerns.    09/20/24 0742 09/20/24 0800 09/20/24 0820  Vitals  Temp 98.1 F (36.7 C)  --   --   Temp Source Oral  --   --   BP (!) 174/71  --  101/87  MAP (mmHg) 103  --  92  BP Location Right Arm  --   --   BP Method Automatic  --   --   Patient Position (if appropriate) Lying  --   --   Pulse Rate 88  --   --   Pulse Rate Source Monitor  --   --   ECG Heart Rate 92  --  (!) 180  Resp 19  --  (!) 21  Level of Consciousness  Level of Consciousness  --  Alert  --   MEWS COLOR  MEWS Score Color Green Green Red

## 2024-09-20 NOTE — TOC Initial Note (Addendum)
 Transition of Care [facility]) - Initial/Assessment Note    Patient Details  Name: Jenna [name] MRN: [id number] Date of Birth: 05/22/1946  [facility] CM/SW Contact:    Landry DELENA Senters, RN Phone Number: 09/20/2024, 9:42 AM  Clinical Narrative:                 RR:[id number] history significant of lung cancer, T2DM, hypertension, hypothyroidism who presented to the ED with complaints of chest pain, weakness, lightheadedness.   Patient lives with son, reports he provides support at home, drives her to appts, and will be transportation home at discharge.   Patient has PCP, manages own medications, DME reviewed-rollator  Continued medical workup. Currently waiting for therapy evals.  CM will continue to follow.  Expected Discharge Plan:  (TBD) Barriers to Discharge: Continued Medical Work up   Patient Goals and CMS Choice            Expected Discharge Plan and Services       Living arrangements for the past 2 months: Apartment                                      Prior Living Arrangements/Services Living arrangements for the past 2 months: Apartment Lives with:: Self, Adult Children Patient language and need for interpreter reviewed:: Yes Do you feel safe going back to the place where you live?: Yes      Need for Family Participation in Patient Care: Yes (Comment) Care giver support system in place?: Yes (comment) Current home services: DME (rollator) Criminal Activity/Legal Involvement Pertinent to Current Situation/Hospitalization: No - Comment as needed  Activities of Daily Living      Permission Sought/Granted                  Emotional Assessment Appearance:: Developmentally appropriate Attitude/Demeanor/Rapport: Engaged Affect (typically observed): Calm Orientation: : Oriented to Situation, Oriented to Place, Oriented to Self, Oriented to  Time Alcohol  / Substance Use: Not Applicable Psych Involvement: No (comment)  Admission  diagnosis:  Hyperkalemia [E87.5] AKI (acute kidney injury) [N17.9] Demand ischemia of myocardium (HCC) [I24.89] 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) [C34.2] Patient Active Problem List   Diagnosis Date Noted   CKD stage 3b, GFR 30-44 ml/min (HCC) 07/06/2024   Other headache syndrome 02/09/2024   Subacute cough 02/09/2024   Other fatigue 02/09/2024   Pleural effusion on right 08/25/2023   Hyperkalemia 08/02/2023   History of adenomatous polyp of colon 12/09/2022   Benign neoplasm of colon 12/09/2022   AVM (arteriovenous malformation) of colon 12/09/2022   Encounter for general adult medical examination with abnormal findings 12/18/2021   SOB (shortness of breath) 12/16/2020   Hypothyroidism (acquired) 02/14/2020   Aortic atherosclerosis 06/10/2019   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 ([place]), [place] - [address] [place] ([place]) [place] [address] Phone: [phone] Fax: [phone]     Social Drivers of Health (SDOH) Social History: SDOH Screenings   Food Insecurity: No Food Insecurity (  09/19/2024)  Housing: Unknown (09/19/2024)  Transportation Needs: No Transportation Needs (09/19/2024)  Utilities: Not At Risk (09/19/2024)  Alcohol  Screen: Low Risk (01/17/2024)  Depression (PHQ2-9): Low Risk (02/09/2024)  Financial Resource Strain: Low Risk (01/17/2024)  Physical Activity: Inactive (01/17/2024)  Social Connections: Socially Isolated (09/19/2024)  Stress: No Stress Concern Present (01/17/2024)  Tobacco Use: Medium Risk  (09/18/2024)  Health Literacy: Adequate Health Literacy (01/17/2024)   SDOH Interventions: Social Connections Interventions: [facility] Provided, Inpatient TOC   Readmission Risk Interventions     No data to display

## 2024-09-20 NOTE — Plan of Care (Signed)

## 2024-09-20 NOTE — Progress Notes (Signed)
 Nephrology Follow-Up Consult note   Assessment/Recommendations: Jenna [name] is a/an 79 y.o. female with a past medical history significant for DM2, CKD 3b, HTN, admitted for hyperkalemia and AKI.       AKI on CKD 3b: BL 1.5. Likely some dehydration and tubular injury -creatinine continues to improve -Continue to monitor daily Cr, Dose meds for GFR -Monitor Daily I/Os, Daily weight  -Maintain MAP>65 for optimal renal perfusion.  -Avoid nephrotoxic medications including NSAIDs -Use synthetic opioids (Fentanyl /Dilaudid ) if needed  Severe Hyperkalemia: now much improved with normal K.  Now resolved.  HTN: cont BP meds as ordered AGMA: now improved. No need for further bicarb  Uncontrolled Diabetes Mellitus Type 2 with Hyperglycemia: mgmt per primary  Given the patient's improvement we will sign off.   Recommendations conveyed to primary service.    [facility] 09/20/2024 10:34 AM  ___________________________________________________________  CC: syncope  Interval History/Subjective: Feeling okay today.  Said she had some palpitations.  Otherwise no complaints.  Creatinine improving.   Medications:  Current Facility-Administered Medications  Medication Dose Route Frequency Provider Last Rate Last Admin   0.9 %  sodium chloride  infusion   Intravenous Continuous Patel, Pranav M, MD 100 mL/hr at 09/20/24 0947 Rate Change at 09/20/24 0947   acetaminophen  (TYLENOL ) tablet 650 mg  650 mg Oral Q6H PRN Patel, Jay K, MD   650 mg at 09/19/24 0802   benzonatate  (TESSALON ) capsule 100 mg  100 mg Oral TID Patel, Pranav M, MD   100 mg at 09/20/24 0950   dextromethorphan  (DELSYM ) 30 MG/5ML liquid 30 mg  30 mg Oral BID Patel, Pranav M, MD   30 mg at 09/20/24 9049   fluticasone  furoate-vilanterol (BREO ELLIPTA ) 200-25 MCG/ACT 1 puff  1 puff Inhalation Daily Mdala-Gausi, Golden Pillow, MD   1 puff at 09/20/24 0735   guaiFENesin  (MUCINEX ) 12 hr tablet  600 mg  600 mg Oral BID Patel, Pranav M, MD   600 mg at 09/20/24 0950   heparin  injection 5,000 Units  5,000 Units Subcutaneous Q8H Mdala-Gausi, Masiku Agatha, MD   5,000 Units at 09/20/24 9482   insulin  aspart (novoLOG ) injection 0-15 Units  0-15 Units Subcutaneous TID WC Patel, Pranav M, MD       insulin  aspart (novoLOG ) injection 0-5 Units  0-5 Units Subcutaneous QHS Patel, Pranav M, MD       insulin  glargine-yfgn injection 20 Units  20 Units Subcutaneous QHS Patel, Pranav M, MD       ipratropium-albuterol  (DUONEB) 0.5-2.5 (3) MG/3ML nebulizer solution 3 mL  3 mL Nebulization Q4H PRN Patel, Pranav M, MD       levothyroxine  (SYNTHROID ) tablet 75 mcg  75 mcg Oral Q0600 Mdala-Gausi, Masiku Agatha, MD   75 mcg at 09/20/24 0517   metoprolol  tartrate (LOPRESSOR ) injection 2.5 mg  2.5 mg Intravenous Q6H PRN Patel, Pranav M, MD       metoprolol  tartrate (LOPRESSOR ) tablet 25 mg  25 mg Oral Q8H Patel, Pranav M, MD   25 mg at 09/20/24 0950   predniSONE  (DELTASONE ) tablet 20 mg  20 mg Oral Q breakfast Patel, Pranav M, MD   20 mg at 09/20/24 9049      Review of Systems: 10 systems reviewed and negative except per interval history/subjective  Physical Exam: Vitals:   09/20/24 0736 09/20/24 0742  BP:  (!) 174/71  Pulse:  88  Resp:  19  Temp:  98.1 F (36.7 C)  SpO2: 99%    No intake/output  data recorded.  Intake/Output Summary (Last 24 hours) at 09/20/2024 1034 Last data filed at 09/19/2024 1647 Gross per 24 hour  Intake 1277.5 ml  Output --  Net 1277.5 ml   Constitutional: well-appearing, no acute distress ENMT: ears and nose without scars or lesions, MMM CV: normal rate, trace edema Respiratory: bilateral chest rise, normal work of breathing Gastrointestinal: soft, non-tender, no palpable masses or hernias Skin: no visible lesions or rashes Psych: alert, judgement/insight appropriate, appropriate mood and affect   Test Results I personally reviewed new and old clinical labs and  radiology tests Lab Results  Component Value Date   NA 136 09/20/2024   K 4.4 09/20/2024   CL 102 09/20/2024   CO2 24 09/20/2024   BUN 47 (H) 09/20/2024   CREATININE 2.12 (H) 09/20/2024   GFR 32.58 (L) 02/09/2024   CALCIUM  8.6 (L) 09/20/2024   ALBUMIN 3.5 09/18/2024   PHOS 3.8 08/03/2023    CBC Recent Labs  Lab 09/18/24 1400 09/18/24 1405 09/18/24 1817 09/19/24 0306 09/20/24 0349  WBC 13.8*  --  11.5* 10.3 7.1  NEUTROABS 12.6*  --   --   --   --   HGB 10.6*   < > 9.8* 8.9* 9.2*  HCT 37.0   < > 33.2* 29.4* 30.2*  MCV 89.4  --  85.6 82.6 83.4  PLT 196  --  210 181 204   < > = values in this interval not displayed.

## 2024-09-20 NOTE — Progress Notes (Addendum)
 [facility] Progress Note Patient: Jenna [name] FMW:[id number] DOB: 11-21-1945  DOA: 09/18/2024 DOS: the patient was seen and examined on 09/20/2024  Brief Summary: PMH of HTN, hypothyroidism, lung disease, T2DM presented to the hospital with complaints of chest pain shortness of breath and fatigue. Found to have hyperkalemia with AKI and DKA.  Treated with IV insulin  and IV fluid.  Significant events: 2/3 admitted to the hospital.  Started on emergent therapy to control her hyperkalemia as well as IV fluid for hyperglycemia. 2/5 A-fib with RVR temporary.  Consults: Nephrology   Assessment and plan: Hyperkalemia, resolved Unclear etiology. Patient does not currently on any medications that would cause hyperkalemia. She does not take potassium supplements at home. Patient had notable EKG changes on presentation (peaked T waves). S/p IV insulin , albuterol , Lokelma , calcium  gluconate. Nephrology consulted.  Input appreciated. Patient was on a bicarbonate infusion overnight. Potassium now normalized.  Paroxysmal A-fib with RVR. Junctional rhythm. EKG at the time of the admission showed evidence of junctional rhythm. EKG currently shows evidence of paroxysmal A-fib with RVR responded very well with IV Lopressor  dose. Currently will switch her Toprol -XL to oral Lopressor  3 times daily and monitor. Given the brief event without any further reoccurrence which could have been related to her cough and nebulizer therapy, at present not initiating any anticoagulation.  Concern for pericarditis. After A-fib with RVR converted to sinus rhythm and EKG performed which shows evidence of pericarditis. Patient does report pleuritic chest pain at the time of my evaluation. Patient reported some epigastric pain earlier during the hospitalization as well. Troponins are generally elevated but again not typical for ACS pattern. Concern that this is most likely presentation with pericarditis  based on the EKG as well as patient's presentation. Reassuring that echocardiogram does not show any evidence of significant pericardial effusion. Etiology is not clear but could related to her radiation history. Will initiate therapy with prednisone  and monitor. Given CKD not a good candidate for colchicine therapy.  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 Patient presented with creatinine of 2.7. Nephrology following. Treated with IV fluid.   Metabolic acidosis, present on admission Likely due to impending DKA (euglycemic DKA due to Farxiga ). S/p IV bicarbonate   Will defer further management to nephrology.   T2DM with early DKA on presentation.  On insulin  at home. Patient presented with hyperglycemia and slight increase in anion gap. Likely euglycemic DKA due to Farxiga  use. Patient did not require insulin  infusion. Continue Lantus  insulin , sliding scale   Syncope Patient reports syncopal episode at home. May be related to electrolyte imbalance, echocardiogram does not show any evidence of acute abnormality. Suspect A-fib with RVR could be the culprit.  Will monitor on telemetry.   Troponin elevation Troponin elevated in the 400 range but not typical for ACS pattern. Suspect this is secondary to poor renal clearance as well as demand ischemia in the setting of RVR.   Hypertension Resumed home metoprolol .   History of lung cancer Outpatient follow-up.  With radiation-induced lung injury.  Obesity. Body mass index is 33.28 kg/m.  Placing the patient at high risk of Corotto.    Code Status: Full Code    DVT Prophylaxis: heparin  injection 5,000 Units Start: 09/18/24 2200 SCDs Start: 09/18/24 1746   Data review I have Reviewed nursing notes, Vitals, and Lab results. Since last encounter, pertinent lab results CBC and BMP   . I have ordered test including CBC and BMP  . I have ordered imaging  chest x-ray  .   Physical exam. Vitals:   09/20/24 0736  09/20/24 0742 09/20/24 1232 09/20/24 1601  BP:  (!) 174/71 (!) 143/59 (!) 140/58  Pulse:  88 78 67  Resp:  19 18 (!) 21  Temp:  98.1 F (36.7 C) 98 F (36.7 C) 97.8 F (36.6 C)  TempSrc:  Oral Oral Oral  SpO2: 99%  97% 99%  Weight:      Height:      Basal crackles. S1-S2 present. Bowel sound present. Trace edema of lower extremity bilaterally No focal deficit. Subjective: Denies any acute complaint but reports fatigue and tiredness as well as shortness of breath.  Also has dry cough.  Family Communication: No one at bedside.  Disposition Plan: Status is: Inpatient Remains inpatient appropriate because: Pending further improvement rate controlled   Planned Discharge Destination:Home Diet: Diet Order             Diet renal/carb modified with fluid restriction Diet-HS Snack? Nothing; Fluid restriction: 2000 mL Fluid; Room service appropriate? No; Fluid consistency: Thin  Diet effective now                   MEDICATIONS: Scheduled Meds:  benzonatate   100 mg Oral TID   dextromethorphan   30 mg Oral BID   fluticasone  furoate-vilanterol  1 puff Inhalation Daily   guaiFENesin   600 mg Oral BID   heparin   5,000 Units Subcutaneous Q8H   insulin  aspart  0-15 Units Subcutaneous TID WC   insulin  aspart  0-5 Units Subcutaneous QHS   insulin  glargine-yfgn  20 Units Subcutaneous QHS   levothyroxine   75 mcg Oral Q0600   metoprolol  tartrate  25 mg Oral Q8H   predniSONE   20 mg Oral Q breakfast   Continuous Infusions: PRN Meds:.acetaminophen , ipratropium-albuterol , metoprolol  tartrate The patient is critically ill with multiple organ systems failure and requires high complexity decision making for assessment and support, frequent evaluation and titration of therapies. Critical Care Time devoted to patient care services described in this note is 35 minutes   Author: Yetta Blanch, MD  [facility] 09/20/2024  6:58 PM Between 7PM-7AM, please contact night-coverage, check  [email or web address] for on call.

## 2024-09-20 NOTE — Progress Notes (Signed)
 VASCULAR LAB    Bilateral lower extremity venous duplex has been performed.  See CV proc for preliminary results.   Marinus Eicher, RVT 09/20/2024, 12:02 PM

## 2024-09-21 DIAGNOSIS — I2489 Other forms of acute ischemic heart disease: Secondary | ICD-10-CM

## 2024-09-21 DIAGNOSIS — I4891 Unspecified atrial fibrillation: Secondary | ICD-10-CM

## 2024-09-21 DIAGNOSIS — R9431 Abnormal electrocardiogram [ECG] [EKG]: Secondary | ICD-10-CM

## 2024-09-21 DIAGNOSIS — N179 Acute kidney failure, unspecified: Secondary | ICD-10-CM

## 2024-09-21 LAB — BASIC METABOLIC PANEL WITH GFR
Anion gap: 12 (ref 5–15)
BUN: 52 mg/dL — ABNORMAL HIGH (ref 8–23)
CO2: 20 mmol/L — ABNORMAL LOW (ref 22–32)
Calcium: 8.1 mg/dL — ABNORMAL LOW (ref 8.9–10.3)
Chloride: 102 mmol/L (ref 98–111)
Creatinine, Ser: 1.79 mg/dL — ABNORMAL HIGH (ref 0.44–1.00)
GFR, Estimated: 29 mL/min — ABNORMAL LOW
Glucose, Bld: 119 mg/dL — ABNORMAL HIGH (ref 70–99)
Potassium: 4.5 mmol/L (ref 3.5–5.1)
Sodium: 134 mmol/L — ABNORMAL LOW (ref 135–145)

## 2024-09-21 LAB — CBC
HCT: 31.2 % — ABNORMAL LOW (ref 36.0–46.0)
Hemoglobin: 9.2 g/dL — ABNORMAL LOW (ref 12.0–15.0)
MCH: 25.1 pg — ABNORMAL LOW (ref 26.0–34.0)
MCHC: 29.5 g/dL — ABNORMAL LOW (ref 30.0–36.0)
MCV: 85 fL (ref 80.0–100.0)
Platelets: 218 10*3/uL (ref 150–400)
RBC: 3.67 MIL/uL — ABNORMAL LOW (ref 3.87–5.11)
RDW: 19.1 % — ABNORMAL HIGH (ref 11.5–15.5)
WBC: 6.2 10*3/uL (ref 4.0–10.5)
nRBC: 0 % (ref 0.0–0.2)

## 2024-09-21 LAB — GLUCOSE, CAPILLARY
Glucose-Capillary: 95 mg/dL (ref 70–99)
Glucose-Capillary: 126 mg/dL — ABNORMAL HIGH (ref 70–99)
Glucose-Capillary: 151 mg/dL — ABNORMAL HIGH (ref 70–99)
Glucose-Capillary: 167 mg/dL — ABNORMAL HIGH (ref 70–99)

## 2024-09-21 LAB — TROPONIN T, HIGH SENSITIVITY
Troponin T High Sensitivity: 326 ng/L (ref 0–19)
Troponin T High Sensitivity: 310 ng/L (ref 0–19)

## 2024-09-21 LAB — MAGNESIUM: Magnesium: 2.8 mg/dL — ABNORMAL HIGH (ref 1.7–2.4)

## 2024-09-21 MED ORDER — ISOSORBIDE MONONITRATE ER 30 MG PO TB24
15.0000 mg | ORAL_TABLET | Freq: Every day | ORAL | Status: AC
  Administered 2024-09-21: 15 mg via ORAL
  Filled 2024-09-21: qty 1

## 2024-09-21 MED ORDER — NITROGLYCERIN 0.4 MG SL SUBL
0.4000 mg | SUBLINGUAL_TABLET | SUBLINGUAL | Status: AC | PRN
  Administered 2024-09-21: 0.4 mg via SUBLINGUAL
  Filled 2024-09-21: qty 1

## 2024-09-21 MED ORDER — METOPROLOL TARTRATE 25 MG PO TABS
25.0000 mg | ORAL_TABLET | Freq: Two times a day (BID) | ORAL | Status: AC
  Administered 2024-09-21: 25 mg via ORAL
  Filled 2024-09-21: qty 1

## 2024-09-21 MED ORDER — HYDROMORPHONE HCL 1 MG/ML IJ SOLN
0.5000 mg | Freq: Once | INTRAMUSCULAR | Status: AC
  Administered 2024-09-21: 0.5 mg via INTRAVENOUS
  Filled 2024-09-21: qty 0.5

## 2024-09-21 MED ORDER — FUROSEMIDE 10 MG/ML IJ SOLN
20.0000 mg | Freq: Once | INTRAMUSCULAR | Status: AC
  Administered 2024-09-21: 20 mg via INTRAVENOUS
  Filled 2024-09-21: qty 2

## 2024-09-21 NOTE — Progress Notes (Signed)
 [facility] Progress Note Patient: Jenna [name] FMW:[id number] DOB: [birth year]  DOA: 09/18/2024 DOS: the patient was seen and examined on 09/21/2024  Brief Summary: PMH of HTN, hypothyroidism, lung disease, T2DM presented to the hospital with complaints of chest pain shortness of breath and fatigue. Found to have hyperkalemia with AKI and DKA.  Treated with IV insulin  and IV fluid.   Significant events: 2/3 admitted to the hospital.  Started on emergent therapy to control her hyperkalemia as well as IV fluid for hyperglycemia. 2/4 echocardiogram EF 5055%, global hypokinesis, normal valves. 2/5 A-fib with RVR temporary.  EKG showed pericarditis.  Started on prednisone .  Lower leg Doppler negative for DVT. 2/6 cardiology consulted for chest tightness and abnormal EKG.  Consults: Nephrology  Cardiology   Assessment and plan: Hyperkalemia, resolved Unclear etiology. Patient does not currently on any medications that would cause hyperkalemia. She does not take potassium supplements at home. Patient had notable EKG changes on presentation (peaked T waves). S/p IV insulin , albuterol , Lokelma , calcium  gluconate. Nephrology consulted.  Input appreciated. Patient was on a bicarbonate infusion as well. Potassium now normalized.   Paroxysmal A-fib with RVR. Junctional rhythm. EKG at the time of the admission showed evidence of junctional rhythm. EKG currently shows evidence of paroxysmal A-fib with RVR responded very well with IV Lopressor  dose. Switched her Toprol -XL to oral Lopressor  2 times daily and monitor. Given the brief event of A-fib without any further reoccurrence which could have been related to her cough and nebulizer therapy, at present not initiating any anticoagulation. Cardiology consulted as well..   Acute pericarditis.  Elevated troponin.  Abnormal EKG. After A-fib with RVR converted to sinus rhythm and EKG performed which shows evidence of  pericarditis. Patient reported pleuritic chest pain on 2/5. Patient reported some epigastric pain earlier during the hospitalization as well. Troponins are generally elevated but again not typical for ACS pattern. this is most likely presentation with pericarditis based on the EKG as well as patient's symptoms. Reassuring that echocardiogram does not show any evidence of significant pericardial effusion. Etiology is not clear but could related to her radiation history. Will initiate therapy with prednisone  and monitor. Given CKD not a good candidate for colchicine therapy. Reported some chest tightness on 2/16 repeat EKG showed further worsening of ST elevation.  Troponins are trending down.  Cardiology consult appreciated.   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 Patient presented with creatinine of 2.7. Nephrology following. Treated with IV fluid.   Metabolic acidosis, present on admission On further evaluation suspect this is mostly secondary to renal failure rather than concern for DKA. Treated with IV bicarb infusion.  Currently normal.   Type 2 diabetes mellitus, uncontrolled with hyperglycemia with long-term insulin  use with CKD. Patient presented with hyperglycemia and slight increase in anion gap. A1c 6.8.  No evidence of DKA. Blood sugars improved significantly and remained stable right now on Lantus  and sliding scale insulin .   Syncope Patient reports syncopal episode at home. Could be related to arrhythmia. Echocardiogram does not show any evidence of acute abnormality. Suspect A-fib with RVR could be the culprit.  Will monitor on telemetry.   Hypertension Currently on metoprolol .   History of lung cancer Outpatient follow-up.  With radiation-induced lung injury.   Obesity. Body mass index is 33.28 kg/m.  Placing the patient at high risk of Corotto.    Code Status: Full Code   DVT Prophylaxis: heparin  injection 5,000 Units Start: 09/18/24  2200 SCDs Start: 09/18/24 1746  Data review I have Reviewed nursing notes, Vitals, and Lab results. Since last encounter, pertinent lab results CBC and BMP   . I have ordered test including CBC and BMP. I have discussed pt's care plan and test results with cardiology. I have independently visualized and interpreted EKG which showed EKG: normal sinus rhythm, nonspecific ST and T waves changes.  Physical exam. Vitals:   09/21/24 0900 09/21/24 1116 09/21/24 1124 09/21/24 1630  BP: (!) 137/52 120/72 134/61 (!) 154/68  Pulse:   69 71  Resp: 18 20 20 19   Temp:   97.7 F (36.5 C) 97.7 F (36.5 C)  TempSrc:   Oral Oral  SpO2:      Weight:      Height:      Basal crackles. S1-S2 present. Trace lower extremity edema. Bowel sound present.  Nontender.  Subjective: Reported chest tightness earlier this morning.  No nausea no vomiting no fever no chills.  Also reported dizziness as shortness of breath with working with physical therapy.  Family Communication: No one at bedside  Disposition Plan: Status is: Inpatient Remains inpatient appropriate because: Monitor for improvement cardiac symptoms.   Planned Discharge Destination: Home Diet: Diet Order             Diet renal/carb modified with fluid restriction Diet-HS Snack? Nothing; Fluid restriction: 1200 mL Fluid; Room service appropriate? Yes; Fluid consistency: Thin  Diet effective now                   MEDICATIONS: Scheduled Meds:  benzonatate   100 mg Oral TID   dextromethorphan   30 mg Oral BID   fluticasone  furoate-vilanterol  1 puff Inhalation Daily   guaiFENesin   600 mg Oral BID   heparin   5,000 Units Subcutaneous Q8H   insulin  aspart  0-15 Units Subcutaneous TID WC   insulin  aspart  0-5 Units Subcutaneous QHS   insulin  glargine-yfgn  20 Units Subcutaneous QHS   isosorbide  mononitrate  15 mg Oral Daily   levothyroxine   75 mcg Oral Q0600   metoprolol  tartrate  25 mg Oral BID   predniSONE   20 mg Oral Q breakfast    Continuous Infusions: PRN Meds:.acetaminophen , ipratropium-albuterol , metoprolol  tartrate, nitroGLYCERIN   Author: Yetta Blanch, MD  [facility] 09/21/2024  5:29 PM Between 7PM-7AM, please contact night-coverage, check [email or web address] for on call.

## 2024-09-21 NOTE — Progress Notes (Signed)
 Lying:      Bp:136/40   HR:67   Pt asymptomatic.  Sitting :    BP:137/52  HR :60  Pt asymptomatic. Standing: Bp:132/59  HR :83  Pt complains of dizziness.

## 2024-09-21 NOTE — Progress Notes (Signed)
 Pt complaining of upper abdominal tightness. States she felt this sensation last year when she needed a thoracentesis. MD Tobie notified. Order for Lasix  received. Pt medicated per MAR.

## 2024-09-21 NOTE — Evaluation (Signed)
 Physical Therapy Evaluation Patient Details Name: Jenna [name] MRN: [id number] DOB: April 11, 1946 Today's Date: 09/21/2024  History of Present Illness  79 y.o female presents to [facility] on 09/17/24 for epigastric and L chest pain. Syncopal event overnight. Found to have hyperkalemia. PMH: lung CA, DMII, HTN, hypothyroidism.  Clinical Impression  Pt is currently mobilizing below her baseline due to fatigue, deconditioning, and DOE. Pt reports intermittent dizziness throughout session with some blurry vision. Orthostatic BP taken and listed in General Comments section. Pt requires CGA/minA for bed mobility with additional time taken to manage dizziness. Pt requiring UE support to maintain sitting balance at this time. Pt completes STS with rolling walker and tolerates short distance ambulation at this time. Pt appears SOB throughout session and reports 5/10 on DOE scale. Pt would benefit from continued PT services focused on strength, balance, and progressing ambulation to improve activity tolerance. Pt appropriate for [facility]  PT upon discharge.        If plan is discharge home, recommend the following: A little help with walking and/or transfers;A little help with bathing/dressing/bathroom;Assistance with cooking/housework;Assist for transportation;Help with stairs or ramp for entrance   Can travel by private vehicle        Equipment Recommendations None recommended by PT  Recommendations for Other Services       Functional Status Assessment Patient has had a recent decline in their functional status and demonstrates the ability to make significant improvements in function in a reasonable and predictable amount of time.     Precautions / Restrictions Precautions Precautions: Fall Recall of Precautions/Restrictions: Intact Restrictions Weight Bearing Restrictions Per Provider Order: No      Mobility  Bed Mobility Overal bed mobility: Needs Assistance Bed Mobility: Supine to Sit      Supine to sit: Contact guard, Min assist     General bed mobility comments: Light assist to pivot hips EOB. Pt takes additional time to complete transfer. Persistant dizziness.    Transfers Overall transfer level: Needs assistance Equipment used: Rolling walker (2 wheels) Transfers: Sit to/from Stand Sit to Stand: Contact guard assist           General transfer comment: Transfer to standing slow and unsteady, improves with second attempt and with walker for balance. Good eccentric control when returning to seated position.    Ambulation/Gait Ambulation/Gait assistance: Contact guard assist Gait Distance (Feet): 10 Feet Assistive device: Rolling walker (2 wheels) Gait Pattern/deviations: Step-through pattern, Decreased stride length, Narrow base of support, Trunk flexed   Gait velocity interpretation: <1.31 ft/sec, indicative of household ambulator   General Gait Details: Pt tolerates short distance with walker, limited by fatigue and dizziness. Demonstrates good management of walker.  Stairs            Wheelchair Mobility     Tilt Bed    Modified Rankin (Stroke Patients Only)       Balance Overall balance assessment: Needs assistance Sitting-balance support: Bilateral upper extremity supported, Feet supported Sitting balance-Leahy Scale: Fair Sitting balance - Comments: UE support due to dizziness, does not require external support.   Standing balance support: Bilateral upper extremity supported, During functional activity, Reliant on assistive device for balance Standing balance-Leahy Scale: Poor Standing balance comment: Reliant on walker at this time but does not use at baseline. Balance affected by dizziness and fatigue. No LOB during session.  Pertinent Vitals/Pain Pain Assessment Pain Assessment: No/denies pain    Home Living Family/patient expects to be discharged to:: Private residence Living Arrangements:  Children (Adult Son) Available Help at Discharge: Family;Available 24 hours/day (Leaves occasionally to run errands, has family that works in dealer) Type of Home: Apartment Home Access: Stairs to enter Entrance Stairs-Rails: Can reach both Entrance Stairs-Number of Steps: 4-5   Home Layout: One level Home Equipment: Rollator (4 wheels);Shower seat      Prior Function Prior Level of Function : Independent/Modified Independent             Mobility Comments: Has rollator but does not use at baseline, independent ADLs Comments: Son drives to appts, Independent with dressing, bathing, toileting.     Extremity/Trunk Assessment   Upper Extremity Assessment Upper Extremity Assessment: Defer to OT evaluation    Lower Extremity Assessment Lower Extremity Assessment: Overall WFL for tasks assessed;Generalized weakness       Communication   Communication Communication: No apparent difficulties    Cognition Arousal: Alert Behavior During Therapy: WFL for tasks assessed/performed   PT - Cognitive impairments: No apparent impairments                         Following commands: Intact       Cueing Cueing Techniques: Verbal cues, Visual cues, Tactile cues     General Comments General comments (skin integrity, edema, etc.): Supine BP: 132/50, Seated BP: 129/46, Standing BP: 137/64. Dizziness persists, pt reports some blurred vision with onset of dizziness but notes that it is intermittent. No significant skin abnormalities noted.    Exercises     Assessment/Plan    PT Assessment Patient needs continued PT services  PT Problem List Decreased strength;Decreased mobility;Decreased activity tolerance;Cardiopulmonary status limiting activity;Decreased balance;Decreased knowledge of use of DME       PT Treatment Interventions DME instruction;Therapeutic exercise;Gait training;Balance training;Stair training;Neuromuscular re-education;Functional mobility  training;Therapeutic activities;Patient/family education    PT Goals (Current goals can be found in the Care Plan section)  Acute Rehab PT Goals Patient Stated Goal: Feel better, go home PT Goal Formulation: With patient Time For Goal Achievement: 10/05/24 Potential to Achieve Goals: Good    Frequency Min 2X/week     Co-evaluation               AM-PAC PT 6 Clicks Mobility  Outcome Measure Help needed turning from your back to your side while in a flat bed without using bedrails?: None Help needed moving from lying on your back to sitting on the side of a flat bed without using bedrails?: A Little Help needed moving to and from a bed to a chair (including a wheelchair)?: A Little Help needed standing up from a chair using your arms (e.g., wheelchair or bedside chair)?: A Little Help needed to walk in hospital room?: Total Help needed climbing 3-5 steps with a railing? : Total 6 Click Score: 15    End of Session   Activity Tolerance: Patient limited by fatigue (Pt limited by DOE) Patient left: in chair;with call bell/phone within reach;with chair alarm set Nurse Communication: Mobility status PT Visit Diagnosis: Unsteadiness on feet (R26.81);Muscle weakness (generalized) (M62.81);Dizziness and giddiness (R42);Other abnormalities of gait and mobility (R26.89)    Time: 9143-9081 PT Time Calculation (min) (ACUTE ONLY): 22 min   Charges:   PT Evaluation $PT Eval Moderate Complexity: 1 Mod   PT General Charges $$ ACUTE PT VISIT: 1 Visit  Sabra Morel, PT, DPT  Acute Rehabilitation Services         Office: [phone]     Sabra MARLA Morel 09/21/2024, 12:34 PM

## 2024-09-21 NOTE — Plan of Care (Signed)

## 2024-09-21 NOTE — Evaluation (Signed)
 Occupational Therapy Evaluation Patient Details Name: Jenna [name] MRN: [id number] DOB: [birth year] Today's Date: 09/21/2024   History of Present Illness   79 y.o female presents to [facility] on 09/17/24 for epigastric and L chest pain. Syncopal event overnight. Found to have hyperkalemia. PMH: lung CA, DMII, HTN, hypothyroidism.     Clinical Impressions Patient admitted for the diagnosis above.  PTA she lives with her son, and has 24 hour assist as needed at home.  Patient reports Mod I with ADL,iADL and mobility in the home.  Currently she presents with the deficits listed below, needing closer to Mod A for lower body ADL due to dizziness and fatigue, and Min A for simple transfers.  OT will follow in the acute setting to address deficits, and Patient will benefit from continued inpatient follow up therapy, <3 hours/day.     If plan is discharge home, recommend the following:   A little help with walking and/or transfers;A lot of help with bathing/dressing/bathroom;Assist for transportation;Assistance with cooking/housework     Functional Status Assessment   Patient has had a recent decline in their functional status and demonstrates the ability to make significant improvements in function in a reasonable and predictable amount of time.     Equipment Recommendations   BSC/3in1     Recommendations for Other Services         Precautions/Restrictions   Precautions Precautions: Fall Recall of Precautions/Restrictions: Intact Restrictions Weight Bearing Restrictions Per Provider Order: No     Mobility Bed Mobility Overal bed mobility: Needs Assistance Bed Mobility: Sit to Supine     Supine to sit: Min assist, Contact guard Sit to supine: HOB elevated        Transfers Overall transfer level: Needs assistance Equipment used: Rolling walker (2 wheels) Transfers: Sit to/from Stand, Bed to chair/wheelchair/BSC Sit to Stand: Contact guard assist     Step  pivot transfers: Min assist            Balance Overall balance assessment: Needs assistance Sitting-balance support: Bilateral upper extremity supported, Feet supported Sitting balance-Leahy Scale: Fair     Standing balance support: Reliant on assistive device for balance Standing balance-Leahy Scale: Poor                             ADL either performed or assessed with clinical judgement   ADL Overall ADL's : Needs assistance/impaired Eating/Feeding: NPO   Grooming: Wash/dry hands;Wash/dry face;Supervision/safety;Sitting   Upper Body Bathing: Supervision/ safety;Sitting   Lower Body Bathing: Sit to/from stand;Minimal assistance;Sitting/lateral leans   Upper Body Dressing : Supervision/safety;Sitting   Lower Body Dressing: Sit to/from stand;Minimal assistance;Sitting/lateral leans;Moderate assistance   Toilet Transfer: Minimal assistance;Stand-pivot;BSC/3in1                   Vision Patient Visual Report: Blurring of vision Additional Comments: With onset of dizziness.  Patient reports dizziness in ongoing.     Perception Perception: Not tested       Praxis Praxis: Not tested       Pertinent Vitals/Pain Pain Assessment Pain Assessment: No/denies pain     Extremity/Trunk Assessment Upper Extremity Assessment Upper Extremity Assessment: Overall WFL for tasks assessed   Lower Extremity Assessment Lower Extremity Assessment: Defer to PT evaluation   Cervical / Trunk Assessment Cervical / Trunk Assessment: Normal   Communication Communication Communication: No apparent difficulties   Cognition Arousal: Alert Behavior During Therapy: WFL for tasks assessed/performed Cognition: No apparent impairments  Following commands: Intact       Cueing  General Comments   Cueing Techniques: Verbal cues;Visual cues;Tactile cues  Dizziness persists, pt reports some blurred vision with onset of  dizziness.   Exercises     Shoulder Instructions      Home Living Family/patient expects to be discharged to:: Private residence Living Arrangements: Children Available Help at Discharge: Family;Available 24 hours/day Type of Home: Apartment Home Access: Stairs to enter [employer] of Steps: 4-5 Entrance Stairs-Rails: Can reach both Home Layout: One level     Bathroom Shower/Tub: Chief Strategy Officer: Handicapped height Bathroom Accessibility: Yes How Accessible: Accessible via walker Home Equipment: Rollator (4 wheels);Shower seat          Prior Functioning/Environment Prior Level of Function : Independent/Modified Independent             Mobility Comments: Has rollator but does not use at baseline, independent ADLs Comments: Son drives to appts, Independent with dressing, bathing, toileting.    OT Problem List: Decreased strength;Decreased activity tolerance;Impaired balance (sitting and/or standing)   OT Treatment/Interventions: Self-care/ADL training;Balance training;Therapeutic activities;Patient/family education;DME and/or AE instruction      OT Goals(Current goals can be found in the care plan section)   Acute Rehab OT Goals Patient Stated Goal: Return home OT Goal Formulation: With patient Time For Goal Achievement: 10/05/24 Potential to Achieve Goals: Good ADL Goals Pt Will Perform Grooming: with modified independence;standing Pt Will Perform Lower Body Dressing: with modified independence;sit to/from stand Pt Will Transfer to Toilet: with modified independence;ambulating;regular height toilet   OT Frequency:  Min 2X/week    Co-evaluation              AM-PAC OT 6 Clicks Daily Activity     Outcome Measure Help from another person eating meals?: None Help from another person taking care of personal grooming?: None Help from another person toileting, which includes using toliet, bedpan, or urinal?: A Little Help  from another person bathing (including washing, rinsing, drying)?: A Little Help from another person to put on and taking off regular upper body clothing?: None Help from another person to put on and taking off regular lower body clothing?: A Lot 6 Click Score: 20   End of Session Equipment Utilized During Treatment: Gait belt;Rolling walker (2 wheels) Nurse Communication: Mobility status  Activity Tolerance: Patient tolerated treatment well;Patient limited by fatigue Patient left: in bed;with call bell/phone within reach  OT Visit Diagnosis: Unsteadiness on feet (R26.81);Dizziness and giddiness (R42)                Time: 1350-1408 OT Time Calculation (min): 18 min Charges:  OT General Charges $OT Visit: 1 Visit OT Evaluation $OT Eval Moderate Complexity: 1 Mod  09/21/2024  RP, OTR/L  Acute Rehabilitation Services  Office:  [phone]   Charlie JONETTA Halsted 09/21/2024, 2:18 PM

## 2024-09-21 NOTE — Consult Note (Addendum)
 "  Cardiology Consultation   Patient ID: Jenna [name] MRN: [id number]; DOB: [birth year]  Admit date: 09/18/2024 Date of Consult: 09/21/2024  PCP:  [name] LABOR, MD   [facility] Providers Cardiologist: New   Patient Profile: Jenna [name] is a 79 y.o. female with a hx of HTN, CKD stage III-IV (baseline creatinine 1.4-1.5), history of hyperkalemia, type 2 diabetes mellitus, hypothyroidism, stage IIIb non-small cell lung cancer s/p chemotherapy/radiation in 2019-2020 now in remission who is being seen 09/21/2024 for the evaluation of chest pain at the request of Dr. Yetta Blanch.  History of Present Illness: Ms. Nims has medical history as stated above who does not follow with outpatient cardiology.  Has history of stage IIIb NSCLC with trapped right upper lung and recurrent right pleural effusion, s/p thoracentesis in October 2024.  Admitted in December 2024 for AKI presumed to be from ARB and volume depletion as well as hyperkalemia with secondary junctional rhythm.  Patient was stabilized with correction of hyperkalemia and discharged off of losartan . Admitted in January 2025 for ongoing shortness of breath and orthopnea, underwent right thoracentesis of 1500 cc.  Due to recurrent right sided pleural effusion, she required a PleurX catheter which was removed in November 2025.  CT scan in December 2025 showed paratracheal adenopathy, small chronic right sided pleural effusion, and bilateral emphysema.  Presently, patient presented to the ED on 2/3 complaining of chest pain, weakness, lightheadedness since a day prior.  That day, patient took Tylenol  but did not have much relief of her chest pain, which persisted and kept her up throughout the night.  Had a syncopal episode on her way to the restroom during the night, then continued to have lightheadedness/episode of vomiting the following day leading up to her arrival to the ED.  On arrival, patient was  initially a code STEMI due to EKG abnormalities. However, peaked T waves felt to be due to hyperkalemia.  Other notable workup includes: Potassium greater than 7.5, creatinine 2.65 (baseline 1.4-1.5).  Given IV insulin , albuterol , Lokelma , calcium  gluconate.  Repeat potassium 7.5 then 6.6.  Bicarb 15 then 19.  Chest pain resolved with this. Admitted for further evaluation and management of hyperkalemia and metabolic acidosis. Seen by nephrology who felt no immediate indication for dialysis and recommended continued medical management for hyperkalemia and AKI/CKD III-IV. Cardiology consulted for chest pain.  Patient states that her pain is more of a tightness distributed across her upper abdomen, directly under her breasts. Non-pleuritic. No exacerbating factors but notes that it is relieved by rolling onto either side. History of recurrent right-sided pericardial effusion for which she has undergone thoracentesis twice. Also had PluerX from May to November 2025. States that her current tightness feels similar to how she felt in the past when she was found to have recurrence of pericardial effusion. States that it began the night prior to coming to the ER, however it was more the syncope that prompted her to come rather than the tightness. Following emergent treatment for hyperkalemia in the ED, she notes that the tightness subsided. Of note, she had a short episode of atrial fibrillation on yesterday evening, converted to NSR after 500 mL NS bolus, IV metoprolol  5 mg, IV mag. Denies any palpitations during this. No chest pain/upper abdominal tightness at that time either. She states that the tightness did return later that evening and has persisted into today. Reports baseline dyspnea and fatigue; denies chest pain, palpitations, and lower extremity swelling.    Past  Medical History:  Diagnosis Date   Blood transfusion without reported diagnosis    Cataract    DM (diabetes mellitus) (HCC)    Dyspnea     GERD (gastroesophageal reflux disease)    Hyperlipidemia    Hypertension    Iron  deficiency anemia    NSCL ca dx'd 03/2018   Pneumonia    Thyroid  disease     Past Surgical History:  Procedure Laterality Date   BIOPSY  12/09/2022   Procedure: BIOPSY;  Surgeon: Leigh Elspeth SQUIBB, MD;  Location: [facility];  Service: Gastroenterology;;   BRONCHIAL BIOPSY  04/05/2023   Procedure: BRONCHIAL BIOPSIES;  Surgeon: Kassie Acquanetta Bradley, MD;  Location: [facility];  Service: Cardiopulmonary;;   BRONCHIAL BRUSHINGS  04/05/2023   Procedure: BRONCHIAL BRUSHINGS;  Surgeon: Kassie Acquanetta Bradley, MD;  Location: [facility];  Service: Cardiopulmonary;;   BRONCHIAL WASHINGS  04/05/2023   Procedure: BRONCHIAL WASHINGS;  Surgeon: Kassie Acquanetta Bradley, MD;  Location: [facility];  Service: Cardiopulmonary;;   CATARACT EXTRACTION Right    CHOLECYSTECTOMY     COLONOSCOPY  10/24/2009   normal rectum/1X1cm abnormal lesion in the ascending colon (bx benign). TI normal for 10cm.  Prep difficult/inadequate. f/u TCS 09/2012 recommended   COLONOSCOPY  10/13/2004   Normal rectum/Diminutive polyps, splenic flexure, cold biopsied/removed.  Remainder of colonic mucosa appeared normal.   COLONOSCOPY N/A 12/14/2012   MFM:Rnonwpr polyp-tubular adenoma   COLONOSCOPY WITH PROPOFOL  N/A 12/09/2022   Procedure: COLONOSCOPY WITH PROPOFOL ;  Surgeon: Leigh Elspeth SQUIBB, MD;  Location: [facility];  Service: Gastroenterology;  Laterality: N/A;   ESOPHAGOGASTRODUODENOSCOPY  10/13/2004    Normal esophagus/ Nodular volcano like lesion in the antrum, either representing a  pancreatic rest or leiomyoma, biopsied.  Remainder of the gastric mucosa appeared normal, normal D1-D2   ESOPHAGOGASTRODUODENOSCOPY  10/24/2009   Benign biopsies. normal esophagus/small hiatal hernia/nodular lesion antrum/distal greater curvature. duodenal AVM s/p ablation   GIVENS CAPSULE STUDY  07/27/2010    multiple arteriovenous malformations which could definitely  be the contributor to her drifting hemoglobin and hematocrit   HEMOSTASIS CONTROL  04/05/2023   Procedure: HEMOSTASIS CONTROL;  Surgeon: Kassie Acquanetta Bradley, MD;  Location: [facility];  Service: Cardiopulmonary;;   HOT HEMOSTASIS N/A 12/09/2022   Procedure: HOT HEMOSTASIS (ARGON PLASMA COAGULATION/BICAP);  Surgeon: Leigh Elspeth SQUIBB, MD;  Location: [facility];  Service: Gastroenterology;  Laterality: N/A;   IR PERC PLEURAL DRAIN W/INDWELL CATH W/IMG GUIDE  01/03/2024   IR REMOVAL OF PLURAL CATH W/CUFF  06/29/2024   IR THORACENTESIS RIGHT ASP PLEURAL SPACE W/IMG GUIDE  12/16/2020   POLYPECTOMY  12/09/2022   Procedure: POLYPECTOMY;  Surgeon: Leigh Elspeth SQUIBB, MD;  Location: [facility];  Service: Gastroenterology;;   THORACENTESIS  04/05/2023   Procedure: THORACENTESIS;  Surgeon: Kassie Acquanetta Bradley, MD;  Location: [facility];  Service: Cardiopulmonary;;   THORACENTESIS Right 06/08/2023   Procedure: THORACENTESIS;  Surgeon: Claudene Toribio BROCKS, MD;  Location: [facility];  Service: Pulmonary;  Laterality: Right;   THORACENTESIS Right 08/29/2023   Procedure: THORACENTESIS;  Surgeon: Meade Verdon RAMAN, MD;  Location: [facility];  Service: Pulmonary;  Laterality: Right;   TUBAL LIGATION     US  ECHOCARDIOGRAPHY     VIDEO BRONCHOSCOPY N/A 04/05/2023   Procedure: VIDEO BRONCHOSCOPY WITHOUT FLUORO;  Surgeon: Kassie Acquanetta Bradley, MD;  Location: [facility];  Service: Cardiopulmonary;  Laterality: N/A;   VIDEO BRONCHOSCOPY WITH ENDOBRONCHIAL ULTRASOUND N/A 04/27/2018   Procedure: VIDEO BRONCHOSCOPY WITH ENDOBRONCHIAL ULTRASOUND;  Surgeon: Kerrin Elspeth BROCKS, MD;  Location: [facility]  OR;  Service: Thoracic;  Laterality: N/A;       Scheduled Meds:  benzonatate   100 mg Oral TID   dextromethorphan   30 mg Oral BID   fluticasone  furoate-vilanterol  1 puff Inhalation Daily   guaiFENesin   600 mg Oral BID   heparin   5,000 Units Subcutaneous Q8H   insulin  aspart  0-15 Units Subcutaneous TID WC   insulin   aspart  0-5 Units Subcutaneous QHS   insulin  glargine-yfgn  20 Units Subcutaneous QHS   isosorbide  mononitrate  15 mg Oral Daily   levothyroxine   75 mcg Oral Q0600   metoprolol  tartrate  25 mg Oral BID   predniSONE   20 mg Oral Q breakfast   Continuous Infusions:  PRN Meds: acetaminophen , ipratropium-albuterol , metoprolol  tartrate, nitroGLYCERIN   Allergies:   Allergies[1]  Social History:   Social History   Socioeconomic History   Marital status: Single    Spouse name: Not on file   Number of children: 3   Years of education: Not on file   Highest education level: Not on file  Occupational History   Occupation: retired  Tobacco Use   Smoking status: Former    Current packs/day: 0.00    Average packs/day: 0.5 packs/day for 55.0 years (27.5 ttl pk-yrs)    Types: Cigarettes    Start date: 03/19/1963    Quit date: 03/18/2018    Years since quitting: 6.5   Smokeless tobacco: Never   Tobacco comments:    smoked off and n  Vaping Use   Vaping status: Never Used  Substance and Sexual Activity   Alcohol  use: No   Drug use: No   Sexual activity: Not Currently  Other Topics Concern   Not on file  Social History Narrative   Patient fully vaccinated      Lives alone/2025   Social Drivers of Health   Tobacco Use: Medium Risk (09/18/2024)   Patient History    Smoking Tobacco Use: Former    Smokeless Tobacco Use: Never    Passive Exposure: Not on Actuary Strain: Low Risk (01/17/2024)   Overall Financial Resource Strain (CARDIA)    Difficulty of Paying Living Expenses: Not hard at all  Food Insecurity: No Food Insecurity (09/19/2024)   Epic    Worried About Radiation Protection Practitioner of Food in the Last Year: Never true    Ran Out of Food in the Last Year: Never true  Transportation Needs: No Transportation Needs (09/19/2024)   Epic    Lack of Transportation (Medical): No    Lack of Transportation (Non-Medical): No  Physical Activity: Inactive (01/17/2024)   Exercise Vital Sign     Days of Exercise per Week: 0 days    Minutes of Exercise per Session: 0 min  Stress: No Stress Concern Present (01/17/2024)   [employer] of Occupational Health - Occupational Stress Questionnaire    Feeling of Stress : Not at all  Social Connections: Socially Isolated (09/19/2024)   Social Connection and Isolation Panel    Frequency of Communication with Friends and Family: More than three times a week    Frequency of Social Gatherings with Friends and Family: More than three times a week    Attends Religious Services: Never    Database Administrator or Organizations: No    Attends Banker Meetings: Never    Marital Status: Never married  Intimate Partner Violence: Not At Risk (09/19/2024)   Epic    Fear of Current or Ex-Partner: No  Emotionally Abused: No    Physically Abused: No    Sexually Abused: No  Depression (PHQ2-9): Low Risk (02/09/2024)   Depression (PHQ2-9)    PHQ-2 Score: 0  Alcohol  Screen: Low Risk (01/17/2024)   Alcohol  Screen    Last Alcohol  Screening Score (AUDIT): 0  Housing: Unknown (09/19/2024)   Epic    Unable to Pay for Housing in the Last Year: No    Number of Times Moved in the Last Year: Not on file    Homeless in the Last Year: Not on file  Utilities: Not At Risk (09/19/2024)   Epic    Threatened with loss of utilities: No  Health Literacy: Adequate Health Literacy (01/17/2024)   B1300 Health Literacy    Frequency of need for help with medical instructions: Never    Family History:    Family History  Problem Relation Age of Onset   Diabetes Sister    Colon cancer Maternal Aunt        greater than age 70   Breast cancer Cousin    Diabetes Daughter    Diabetes Daughter    Amblyopia Neg Hx    Blindness Neg Hx    Cataracts Neg Hx    Glaucoma Neg Hx    Macular degeneration Neg Hx    Retinal detachment Neg Hx    Strabismus Neg Hx    Retinitis pigmentosa Neg Hx    Rectal cancer Neg Hx    Stomach cancer Neg Hx    Colon polyps Neg Hx     Esophageal cancer Neg Hx      ROS:  Please see the history of present illness.   All other ROS reviewed and negative.     Physical Exam/Data: Vitals:   09/21/24 1116 09/21/24 1124 09/21/24 1630 09/21/24 2011  BP: 120/72 134/61 (!) 154/68 (!) 146/54  Pulse:  69 71 (!) 51  Resp: 20 20 19 18   Temp:  97.7 F (36.5 C) 97.7 F (36.5 C) (!) 97.4 F (36.3 C)  TempSrc:  Oral Oral Oral  SpO2:   98%   Weight:      Height:       No intake or output data in the 24 hours ending 09/21/24 2314     09/18/2024    1:51 PM 09/05/2024   10:04 AM 08/06/2024   10:25 AM  Last 3 Weights  Weight (lbs) 200 lb 198 lb 6.4 oz 196 lb  Weight (kg) 90.719 kg 89.994 kg 88.905 kg     Body mass index is 33.28 kg/m.  General: Well nourished, well developed, lying comfortably in bed, on room air, in no acute distress Neck: No JVD Vascular: Distal pulses 2+ bilaterally Cardiac: Normal S1, S2; RRR; no murmur Lungs: Crackles to base of right lung on auscultation Abd: Soft,; NABS.  She has epigastric and right upper quadrant tenderness to palpation mostly in the epigastric region right under the manubrium. Ext: No edema Musculoskeletal: No deformities Skin: Warm and dry  Neuro: No focal abnormalities noted Psych: Normal affect   EKG: Today's EKG was personally reviewed and demonstrates: Sinus rhythm with diffuse ST elevation Telemetry: Telemetry was personally reviewed and demonstrates: Sinus rhythm rates 60-70s  Relevant CV Studies: Echo [09/19/24]: 1. Left ventricular ejection fraction, by estimation, is 50 to 55% . The left ventricle has low normal function. The left ventricle demonstrates global hypokinesis. Left ventricular diastolic parameters were normal. 2. Right ventricular systolic function was not well visualized. The right ventricular size is normal.  3. The mitral valve was not well visualized. Trivial mitral valve regurgitation. 4. The aortic valve was not well visualized. Aortic valve  regurgitation is not visualized.  US  dopper LE [09/20/24]: RIGHT: - There is no evidence of deep vein thrombosis in the lower extremity. - No cystic structure found in the popliteal fossa. LEFT: - There is no evidence of deep vein thrombosis in the lower extremity. However, portions of this examination were limited- see technologist comments above. - No cystic structure found in the popliteal fossa.  CXR [09/20/24]: 1. Increased bilateral lower lung patchy opacities, which may represent atelectasis, aspiration, or pneumonia. 2. Unchanged moderate right pleural effusion. 3. Confluent opacity at the right hilum, likely correlating to postradiation changes.  CXR [09/18/24]: No significant interval change since the prior study. Redemonstration of heterogeneous opacification of right lower hemithorax, essentially similar to the prior study. Findings are better characterized on the prior CT scan from 07/30/2024. There is small right pleural effusion, also grossly unchanged.   Laboratory Data: High Sensitivity Troponin:  No results for input(s): TROPONINIHS in the last 720 hours.  Recent Labs  Lab 09/18/24 1526 09/20/24 0949 09/20/24 1058 09/21/24 1205 09/21/24 1344  TRNPT 413* 495* 490* 326* 310*      Chemistry Recent Labs  Lab 09/19/24 1446 09/20/24 0349 09/21/24 0851  NA 134* 136 134*  K 4.7 4.4 4.5  CL 98 102 102  CO2 24 24 20*  GLUCOSE 150* 112* 119*  BUN 50* 47* 52*  CREATININE 2.36* 2.12* 1.79*  CALCIUM  8.7* 8.6* 8.1*  MG  --   --  2.8*  GFRNONAA 20* 23* 29*  ANIONGAP 13 11 12     Recent Labs  Lab 09/18/24 1400  PROT 9.2*  ALBUMIN 3.5  AST 128*  ALT 58*  ALKPHOS 157*  BILITOT 0.9   Lipids No results for input(s): CHOL, TRIG, HDL, LABVLDL, LDLCALC, CHOLHDL in the last 168 hours.  Hematology Recent Labs  Lab 09/19/24 0306 09/20/24 0349 09/21/24 0851  WBC 10.3 7.1 6.2  RBC 3.56* 3.62* 3.67*  HGB 8.9* 9.2* 9.2*  HCT 29.4* 30.2* 31.2*  MCV 82.6  83.4 85.0  MCH 25.0* 25.4* 25.1*  MCHC 30.3 30.5 29.5*  RDW 18.8* 19.0* 19.1*  PLT 181 204 218   Thyroid  No results for input(s): TSH, FREET4 in the last 168 hours.  BNPNo results for input(s): BNP, PROBNP in the last 168 hours.  DDimer No results for input(s): DDIMER in the last 168 hours.  Radiology/Studies:  DG CHEST PORT 1 VIEW Result Date: 09/20/2024 CLINICAL DATA:  Cough EXAM: PORTABLE CHEST 1 VIEW COMPARISON:  Chest radiograph dated 09/18/2024, CT chest dated 07/30/2024 FINDINGS: Normal lung volumes. Confluent opacity at the right hilum, likely correlating to postradiation changes. Increased bilateral lower lung patchy opacities. Unchanged moderate right pleural effusion. No pneumothorax. Right heart border is obscured. No acute osseous abnormality. IMPRESSION: 1. Increased bilateral lower lung patchy opacities, which may represent atelectasis, aspiration, or pneumonia. 2. Unchanged moderate right pleural effusion. 3. Confluent opacity at the right hilum, likely correlating to postradiation changes. Electronically Signed   By: Limin  Xu M.D.   On: 09/20/2024 14:48   VAS US  LOWER EXTREMITY VENOUS (DVT) Result Date: 09/20/2024  Lower Venous DVT Study Patient Name:  Jenna [name] [facility]  Date of Exam:   09/20/2024 Medical Rec #: [id number]               Accession #:    [id number] Date of Birth: 09-12-1945  Patient Gender: F Patient Age:   22 years Exam Location:  [facility] Procedure:      VAS US  LOWER EXTREMITY VENOUS (DVT) Referring Phys: PRANAV PATEL --------------------------------------------------------------------------------  Indications: Edema. Other Indications: Hyperkalemia, AKI, CKD III-IV, hyperglycemia, synocpe at                    home. Limitations: Body habitus, poor ultrasound/tissue interface and Atrial fibrillation with RVR. Comparison Study: No prior study on file Performing Technologist: Alberta Lis RVS  Examination Guidelines: A complete  evaluation includes B-mode imaging, spectral Doppler, color Doppler, and power Doppler as needed of all accessible portions of each vessel. Bilateral testing is considered an integral part of a complete examination. Limited examinations for reoccurring indications may be performed as noted. The reflux portion of the exam is performed with the patient in reverse Trendelenburg.  +---------+---------------+---------+-----------+----------+--------------+ RIGHT    CompressibilityPhasicitySpontaneityPropertiesThrombus Aging +---------+---------------+---------+-----------+----------+--------------+ CFV      Full           Yes      No                                  +---------+---------------+---------+-----------+----------+--------------+ SFJ      Full                                                        +---------+---------------+---------+-----------+----------+--------------+ FV Prox  Full           Yes      No                                  +---------+---------------+---------+-----------+----------+--------------+ FV Mid   Full           Yes      No                                  +---------+---------------+---------+-----------+----------+--------------+ FV DistalFull           Yes      No                                  +---------+---------------+---------+-----------+----------+--------------+ PFV      Full           Yes      No                                  +---------+---------------+---------+-----------+----------+--------------+ POP      Full           Yes      No                                  +---------+---------------+---------+-----------+----------+--------------+ PTV      Full                                                        +---------+---------------+---------+-----------+----------+--------------+  PERO     Full                                                         +---------+---------------+---------+-----------+----------+--------------+   +---------+---------------+---------+-----------+----------+-------------------+ LEFT     CompressibilityPhasicitySpontaneityPropertiesThrombus Aging      +---------+---------------+---------+-----------+----------+-------------------+ CFV      Full           Yes      No                                       +---------+---------------+---------+-----------+----------+-------------------+ SFJ      Full                                                             +---------+---------------+---------+-----------+----------+-------------------+ FV Prox  Full           Yes      No                                       +---------+---------------+---------+-----------+----------+-------------------+ FV Mid   Full                                                             +---------+---------------+---------+-----------+----------+-------------------+ FV DistalFull           Yes      No                                       +---------+---------------+---------+-----------+----------+-------------------+ PFV      Full           Yes      No                                       +---------+---------------+---------+-----------+----------+-------------------+ POP      Full           Yes      No                                       +---------+---------------+---------+-----------+----------+-------------------+ PTV                                                   Not well visualized +---------+---------------+---------+-----------+----------+-------------------+ PERO  Not well visualized +---------+---------------+---------+-----------+----------+-------------------+     Summary: RIGHT: - There is no evidence of deep vein thrombosis in the lower extremity.  - No cystic structure found in the popliteal fossa.  LEFT: - There is no  evidence of deep vein thrombosis in the lower extremity. However, portions of this examination were limited- see technologist comments above.  - No cystic structure found in the popliteal fossa.  *See table(s) above for measurements and observations. Electronically signed by Norman Serve on 09/20/2024 at 12:59:02 PM.    Final    ECHOCARDIOGRAM COMPLETE Result Date: 09/19/2024    ECHOCARDIOGRAM REPORT   Patient Name:   Jenna [name] [facility] Date of Exam: 09/19/2024 Medical Rec #:  [id number]              Height:       65.0 in Accession #:    [id number]             Weight:       200.0 lb Date of Birth:  October 18, 1945             BSA:          1.978 m Patient Age:    78 years               BP:           152/72 mmHg Patient Gender: F                      HR:           86 bpm. Exam Location:  Inpatient Procedure: 2D Echo, Cardiac Doppler and Color Doppler (Both Spectral and Color            Flow Doppler were utilized during procedure). Indications:    Syncope R55  History:        Patient has prior history of Echocardiogram examinations, most                 recent 04/12/2022. Risk Factors:Hypertension and Diabetes.  Sonographer:    Jayson Gaskins Referring Phys: [id number] MASIKU AGATHA MDALA-GAUSI  Sonographer Comments: Suboptimal parasternal window and suboptimal apical window. Image acquisition challenging due to patient body habitus. IMPRESSIONS  1. Left ventricular ejection fraction, by estimation, is 50 to 55%. The left ventricle has low normal function. The left ventricle demonstrates global hypokinesis. Left ventricular diastolic parameters were normal.  2. Right ventricular systolic function was not well visualized. The right ventricular size is normal.  3. The mitral valve was not well visualized. Trivial mitral valve regurgitation.  4. The aortic valve was not well visualized. Aortic valve regurgitation is not visualized. FINDINGS  Left Ventricle: Left ventricular ejection fraction, by estimation, is 50 to 55%. The left  ventricle has low normal function. The left ventricle demonstrates global hypokinesis. The left ventricular internal cavity size was normal in size. There is no left ventricular hypertrophy. Left ventricular diastolic parameters were normal. Right Ventricle: The right ventricular size is normal. No increase in right ventricular wall thickness. Right ventricular systolic function was not well visualized. Left Atrium: Left atrial size was not well visualized. Right Atrium: Right atrial size was not well visualized. Pericardium: There is no evidence of pericardial effusion. Presence of epicardial fat layer. Mitral Valve: The mitral valve was not well visualized. Trivial mitral valve regurgitation. Tricuspid Valve: The tricuspid valve is not well visualized. Tricuspid valve regurgitation is not demonstrated. Aortic Valve: The aortic valve was not well visualized. Aortic valve  regurgitation is not visualized. Pulmonic Valve: The pulmonic valve was not well visualized. Pulmonic valve regurgitation is not visualized. Aorta: The aortic root was not well visualized.  LEFT VENTRICLE PLAX 2D LVIDd:         3.80 cm LVIDs:         2.80 cm LV PW:         0.90 cm LV IVS:        0.70 cm LVOT diam:     1.70 cm LVOT Area:     2.27 cm  MITRAL VALVE MV Area (PHT): 5.23 cm    SHUNTS MV Decel Time: 145 msec    Systemic Diam: 1.70 cm MV E velocity: 85.40 cm/s MV A velocity: 68.70 cm/s MV E/A ratio:  1.24 Kardie Tobb DO Electronically signed by Dub Huntsman DO Signature Date/Time: 09/19/2024/3:06:02 PM    Final    US  RENAL Result Date: 09/18/2024 EXAM: RETROPERITONEAL ULTRASOUND OF THE KIDNEYS 09/18/2024 08:24:00 PM TECHNIQUE: Real-time ultrasonography of the retroperitoneum, specifically the kidneys and urinary bladder, was performed. COMPARISON: None available. CLINICAL HISTORY: Acute kidney injury (AKI). FINDINGS: RIGHT KIDNEY: Right kidney measures 10.3 cm in length. Right renal volume is 86 ml. Normal cortical echogenicity. No  hydronephrosis. No calculus. No mass. LEFT KIDNEY: Left kidney measures 9.1 cm in length. Left renal volume is 109 ml. Normal cortical echogenicity. No hydronephrosis. No calculus. No mass. IMPRESSION: 1. No significant abnormality. Electronically signed by: Norman Gatlin MD 09/18/2024 08:30 PM EST RP Workstation: [id number]   DG Chest Portable 1 View Result Date: 09/18/2024 CLINICAL DATA:  Shortness of breath. EXAM: PORTABLE CHEST 1 VIEW COMPARISON:  07/04/2024. FINDINGS: Redemonstration of heterogeneous opacification of right lower hemithorax, essentially similar to the prior study. Findings are better characterized on the prior CT scan from 07/30/2024. No significant interval change. There is small right pleural effusion, also grossly unchanged. There are nonspecific interstitial and nodular opacities in the left lung, more so in the lower lung zone. No left lung dense consolidation, lung collapse or lung mass. No left pleural effusion. Stable cardio-mediastinal silhouette. No acute osseous abnormalities. The soft tissues are within normal limits. IMPRESSION: *No significant interval change since the prior study. Redemonstration of heterogeneous opacification of right lower hemithorax, essentially similar to the prior study. Findings are better characterized on the prior CT scan from 07/30/2024. There is small right pleural effusion, also grossly unchanged. Electronically Signed   By: Ree Molt M.D.   On: 09/18/2024 14:51     Assessment and Plan: Elevated troponin Presented with 1 day history of reported chest pain, later clarified as upper abdominal pain Initial EKG showed junctional rhythm with diffuse ST elevation, peaked T waves felt to be secondary to hyperkalemia Hs-troponins were 427 >> 413 I suspect that this troponin elevation is due to metabolic derangement with hyperkalemia and AKI, but cannot exclude demand ischemia related infarct.  I do not think it is related to her current  epigastric pain as it has been consistently present since last night. Low suspicion for ACS or cardiac chest pain as patient's pain more accurately located across upper abdomen and described as a constant tightness, particularly felt to be same tightness she felt the last time she had pleural effusion requiring thoracentesis. Additionally with flat troponins uncharacteristic of ACS. Multiple reasons for troponin elevation including severe hyperkalemia, AKI on CKD III-IV, ?euglycemic DKA. Serial EKGs with diffuse ST elevation, however clinical picture does not appear to align with pericarditis as chest pain is not pleuritic in nature  and does not improve/worsen with characteristic movements. Given ST elevation has been present since initial EKG in the ED, suspect diffuse ST elevation secondary to residual effects hyperkalemia rather than pericarditis. Started on prednisone  20 mg daily by primary, not unreasonable to continue this and observe for symptom relief, however do feel this tightness is related to recurrence of right-sided pleural effusion which is also shown on CXR   Primary team has initiated him steroids for empiric management of possible pericarditis given the extensive diffuse ST elevations remain present with troponin elevation.  Her symptoms  present did not really seem consistent with pericarditis, however not unreasonable preferred.  Treatment is some features to potentially correlate with potential pericarditis. Steroids were chosen because of the AKI.  If she does not have improvement in symptoms over the next couple days with the meds and then this is not likely peritonitis I am would probably stop.  Based on elevated troponin and abnormal EKG, I think is probably not unreasonable to consider ischemic evaluation although would try to avoid contrast at this point given her recovering acute on chronic kidney injury.  This would preclude the Coronary CTA as a diagnostic tool, and I think an  outpatient stress PET would probably be the best option once she is cleared from his current illness.  Paroxysmal atrial fibrillation with RVR, now in NSR Found to be in rapid A-fib on 2/5. Given 500 mL NS bolus, 5 mg IV metoprolol , IV magnesium . Repeat EKG back to NSR with diffuse ST elevation. Hs-troponins 495 >> 490 >> 326 >> 310 over period of 18 hours No known history of Afib, however patient was asymptomatic with this so unclear as to if she has had silent Afib prior to this. Would not anticoagulate at this time, especially in case of procedures to be done this admission. However, CHA2DS2-VASc score of 5 as well as increased risk due to cancer history, reasonable to start on renally-dosed DOAC with 30-day cardiac monitor at discharge Presumably new Afib possibly related to hyperkalemia, however feel it is reasonable to have patient undergo ischemic evaluation. Not an ideal candidate for inpatient coronary CTA or cardiac catheterization given AKI/severe renal dysfunction. Reasonable to plan for ischemic evaluation as outpatient Short episode of A-fib last night that may have initiated her chest pain.  She was otherwise asymptomatic as far as any rapid irregular beats palpitation.  This being Zio issue of whether or not she truly has the potential for paroxysmal A-fib.  Given her ongoing epigastric tightness and discomfort that seem to be similar to her symptoms when she last had pleural effusion that needed to be drained, I am concerned that she may require thoracentesis.  Would probably not initiate anticoagulation until we are sure that she is going to no longer require procedures. => My recommendation would be to do a 30-day event monitor to determine A-fib burden before determining whether or not we actually initiate DOAC.    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 The patient's score is based upon: CHF History: 0 HTN History: 1 Diabetes History: 1 Stroke History: 0 Vascular Disease History: 0 Age Score: 2 Gender Score: 1       Signed, Owen MARLA Daniels, PA-C  09/21/2024 5:24 PM  ATTENDING ATTESTATION  I have seen, examined and evaluated the patient this evening along with Hanh K Le, PA-C .  After reviewing all the available data and chart, we discussed the patients laboratory, study & physical findings as well as symptoms in detail.  I agree with her findings, examination as well as impression recommendations as per our discussion.    Attending adjustments noted in italics.   Very confusing scenario, were previously not sure what the etiology for troponin elevation and symptoms are.  The EKG had hyperacute T waves with ST elevations in the setting of hyperkalemia and AKI upon initial arrival but she still has an abnormal EKG with residual ST elevations that are more likely memory repolarization abnormalities from her recent injury.  I think her troponin elevation is probably related to the metabolic derangement But cannot be certain.  I do not think there was an ACS presentation given the flat troponin elevation levels.  She had recurrent symptoms with lower level troponins but still flat.  There was question of possible peritonitis based on diffuse ST elevations and not unreasonable to treat with if she does not improve after  couple days of steroid, would probably stop.  Thankfully her EF is preserved and she is not actively having any heart failure symptoms.  Her current pain that she is having is not a slight chest pain that is more epigastric pain and probably more related to her pleural effusion.  I suspect that she may be needing another thoracentesis as her symptoms seem to be similar to what she had prior to her last thoracentesis.  Not unreasonable to consider outpatient stress test with stress PET which could be arranged on discharge.   She also had a short run of A-fib noted on EKG and the telemetry suggested could have been A-fib as well.  Completely asymptomatic as far as any irregular heartbeats or palpitations, so that we do not know if she may have more occult episodes of A-fib. Would suggest 30-day outpatient monitor and based on those results could discuss potential initiation of DOAC once her current situation is stabilized.  Will continue to reassess her again.     Alm MICAEL Clay, MD, MS Alm Clay, M.D., M.S. Interventional Cardiologist  [facility] Pager # [phone]     Signed, Alm Clay, MD  09/21/2024   For questions or updates, please contact  [facility] Please consult [email or web address] for contact info under           [1]  Allergies Allergen Reactions   Paclitaxel  Other (See Comments)    Unresponsiveness shortly after Taxol  inf started 06/12/18.   Aspirin Other (See Comments)    Stomach bleeding    Esomeprazole Magnesium  Nausea And Vomiting   Losartan      AKI, hyperkalemia   Ace Inhibitors Other (See Comments)    Dizziness, drunk like   "

## 2025-01-18 ENCOUNTER — Ambulatory Visit

## 2025-02-04 ENCOUNTER — Inpatient Hospital Stay: Admitting: Internal Medicine

## 2025-02-04 ENCOUNTER — Inpatient Hospital Stay
